# Patient Record
Sex: Female | Born: 1940 | Race: White | Hispanic: No | Smoking: Never smoker
Health system: Southern US, Community
[De-identification: ages and names within clinical notes are randomized; demographics above are authoritative.]

## PROBLEM LIST (undated history)

## (undated) ENCOUNTER — Emergency Department (HOSPITAL_COMMUNITY): Disposition: A | Discharge status: Home / Self Care | Payer: Medicare Other

## (undated) ENCOUNTER — Emergency Department (HOSPITAL_BASED_OUTPATIENT_CLINIC_OR_DEPARTMENT_OTHER): Admission: EM | Payer: Medicare Other | Source: Home / Self Care

## (undated) DIAGNOSIS — R0602 Shortness of breath: Secondary | ICD-10-CM

## (undated) DIAGNOSIS — I2510 Atherosclerotic heart disease of native coronary artery without angina pectoris: Secondary | ICD-10-CM

## (undated) DIAGNOSIS — M1990 Unspecified osteoarthritis, unspecified site: Secondary | ICD-10-CM

## (undated) DIAGNOSIS — J189 Pneumonia, unspecified organism: Secondary | ICD-10-CM

## (undated) DIAGNOSIS — B952 Enterococcus as the cause of diseases classified elsewhere: Secondary | ICD-10-CM

## (undated) DIAGNOSIS — K219 Gastro-esophageal reflux disease without esophagitis: Secondary | ICD-10-CM

## (undated) DIAGNOSIS — J8410 Pulmonary fibrosis, unspecified: Secondary | ICD-10-CM

## (undated) DIAGNOSIS — Z9981 Dependence on supplemental oxygen: Secondary | ICD-10-CM

## (undated) DIAGNOSIS — D649 Anemia, unspecified: Secondary | ICD-10-CM

## (undated) DIAGNOSIS — T826XXA Infection and inflammatory reaction due to cardiac valve prosthesis, initial encounter: Secondary | ICD-10-CM

## (undated) DIAGNOSIS — I509 Heart failure, unspecified: Secondary | ICD-10-CM

## (undated) DIAGNOSIS — Z5189 Encounter for other specified aftercare: Secondary | ICD-10-CM

## (undated) DIAGNOSIS — I209 Angina pectoris, unspecified: Secondary | ICD-10-CM

## (undated) DIAGNOSIS — K922 Gastrointestinal hemorrhage, unspecified: Secondary | ICD-10-CM

## (undated) DIAGNOSIS — IMO0002 Reserved for concepts with insufficient information to code with codable children: Secondary | ICD-10-CM

## (undated) DIAGNOSIS — R7881 Bacteremia: Secondary | ICD-10-CM

## (undated) DIAGNOSIS — I38 Endocarditis, valve unspecified: Secondary | ICD-10-CM

## (undated) DIAGNOSIS — M329 Systemic lupus erythematosus, unspecified: Secondary | ICD-10-CM

## (undated) DIAGNOSIS — R011 Cardiac murmur, unspecified: Secondary | ICD-10-CM

## (undated) DIAGNOSIS — E785 Hyperlipidemia, unspecified: Secondary | ICD-10-CM

## (undated) DIAGNOSIS — I10 Essential (primary) hypertension: Secondary | ICD-10-CM

## (undated) DIAGNOSIS — I4510 Unspecified right bundle-branch block: Secondary | ICD-10-CM

## (undated) DIAGNOSIS — G4700 Insomnia, unspecified: Secondary | ICD-10-CM

## (undated) DIAGNOSIS — I4891 Unspecified atrial fibrillation: Secondary | ICD-10-CM

## (undated) DIAGNOSIS — N059 Unspecified nephritic syndrome with unspecified morphologic changes: Secondary | ICD-10-CM

## (undated) DIAGNOSIS — Z8719 Personal history of other diseases of the digestive system: Secondary | ICD-10-CM

## (undated) DIAGNOSIS — IMO0001 Reserved for inherently not codable concepts without codable children: Secondary | ICD-10-CM

## (undated) DIAGNOSIS — J42 Unspecified chronic bronchitis: Secondary | ICD-10-CM

## (undated) HISTORY — DX: Hyperlipidemia, unspecified: E78.5

## (undated) HISTORY — DX: Enterococcus as the cause of diseases classified elsewhere: B95.2

## (undated) HISTORY — PX: CORONARY ANGIOPLASTY WITH STENT PLACEMENT: SHX49

## (undated) HISTORY — DX: Infection and inflammatory reaction due to cardiac valve prosthesis, initial encounter: T82.6XXA

## (undated) HISTORY — DX: Insomnia, unspecified: G47.00

## (undated) HISTORY — DX: Enterococcus as the cause of diseases classified elsewhere: R78.81

## (undated) HISTORY — DX: Unspecified atrial fibrillation: I48.91

## (undated) HISTORY — PX: CARDIAC CATHETERIZATION: SHX172

## (undated) HISTORY — DX: Endocarditis, valve unspecified: I38

## (undated) HISTORY — DX: Pulmonary fibrosis, unspecified: J84.10

## (undated) HISTORY — DX: Gastro-esophageal reflux disease without esophagitis: K21.9

## (undated) HISTORY — DX: Atherosclerotic heart disease of native coronary artery without angina pectoris: I25.10

---
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
## 1975-03-27 HISTORY — PX: BREAST LUMPECTOMY: SHX2

## 1997-11-16 ENCOUNTER — Ambulatory Visit (HOSPITAL_COMMUNITY): Admission: RE | Admit: 1997-11-16 | Discharge: 1997-11-17 | Payer: Self-pay | Admitting: Cardiology

## 1998-11-28 ENCOUNTER — Encounter: Payer: Self-pay | Admitting: Emergency Medicine

## 1998-11-28 ENCOUNTER — Inpatient Hospital Stay (HOSPITAL_COMMUNITY): Admission: EM | Admit: 1998-11-28 | Discharge: 1998-12-13 | Payer: Self-pay | Admitting: Emergency Medicine

## 1998-12-02 ENCOUNTER — Encounter: Payer: Self-pay | Admitting: Cardiology

## 1998-12-07 ENCOUNTER — Encounter: Payer: Self-pay | Admitting: Cardiology

## 1998-12-08 ENCOUNTER — Encounter: Payer: Self-pay | Admitting: Critical Care Medicine

## 1998-12-09 ENCOUNTER — Encounter: Payer: Self-pay | Admitting: Cardiology

## 1998-12-10 ENCOUNTER — Encounter: Payer: Self-pay | Admitting: Critical Care Medicine

## 1998-12-12 ENCOUNTER — Encounter: Payer: Self-pay | Admitting: Cardiovascular Disease

## 1998-12-16 ENCOUNTER — Inpatient Hospital Stay (HOSPITAL_COMMUNITY): Admission: EM | Admit: 1998-12-16 | Discharge: 1998-12-17 | Payer: Self-pay | Admitting: Emergency Medicine

## 1998-12-16 ENCOUNTER — Encounter: Payer: Self-pay | Admitting: Cardiology

## 1998-12-16 ENCOUNTER — Encounter: Payer: Self-pay | Admitting: Emergency Medicine

## 1998-12-26 ENCOUNTER — Inpatient Hospital Stay (HOSPITAL_COMMUNITY): Admission: EM | Admit: 1998-12-26 | Discharge: 1998-12-28 | Payer: Self-pay | Admitting: Emergency Medicine

## 1998-12-26 ENCOUNTER — Encounter: Payer: Self-pay | Admitting: Emergency Medicine

## 1999-01-05 ENCOUNTER — Encounter: Admission: RE | Admit: 1999-01-05 | Discharge: 1999-01-05 | Payer: Self-pay | Admitting: Cardiology

## 1999-04-26 ENCOUNTER — Ambulatory Visit (HOSPITAL_COMMUNITY): Admission: RE | Admit: 1999-04-26 | Discharge: 1999-04-26 | Payer: Self-pay

## 1999-08-02 ENCOUNTER — Encounter: Payer: Self-pay | Admitting: Critical Care Medicine

## 1999-08-02 ENCOUNTER — Ambulatory Visit (HOSPITAL_COMMUNITY): Admission: RE | Admit: 1999-08-02 | Discharge: 1999-08-02 | Payer: Self-pay | Admitting: Critical Care Medicine

## 1999-09-18 ENCOUNTER — Ambulatory Visit: Admission: RE | Admit: 1999-09-18 | Discharge: 1999-09-18 | Payer: Self-pay | Admitting: Critical Care Medicine

## 2000-11-24 HISTORY — PX: CATARACT EXTRACTION W/ INTRAOCULAR LENS  IMPLANT, BILATERAL: SHX1307

## 2002-03-04 ENCOUNTER — Encounter: Payer: Self-pay | Admitting: Cardiology

## 2002-03-04 ENCOUNTER — Ambulatory Visit (HOSPITAL_COMMUNITY): Admission: RE | Admit: 2002-03-04 | Discharge: 2002-03-04 | Payer: Self-pay | Admitting: Cardiology

## 2002-03-10 ENCOUNTER — Ambulatory Visit (HOSPITAL_COMMUNITY): Admission: RE | Admit: 2002-03-10 | Discharge: 2002-03-11 | Payer: Self-pay | Admitting: Cardiology

## 2003-03-31 ENCOUNTER — Encounter (INDEPENDENT_AMBULATORY_CARE_PROVIDER_SITE_OTHER): Payer: Self-pay | Admitting: Cardiology

## 2003-03-31 ENCOUNTER — Ambulatory Visit (HOSPITAL_COMMUNITY): Admission: RE | Admit: 2003-03-31 | Discharge: 2003-03-31 | Payer: Self-pay | Admitting: Cardiology

## 2004-03-26 HISTORY — PX: CORONARY ARTERY BYPASS GRAFT: SHX141

## 2004-03-26 HISTORY — PX: CARDIAC VALVE REPLACEMENT: SHX585

## 2004-03-30 ENCOUNTER — Ambulatory Visit (HOSPITAL_COMMUNITY): Admission: RE | Admit: 2004-03-30 | Discharge: 2004-03-30 | Payer: Self-pay | Admitting: Cardiology

## 2004-04-06 ENCOUNTER — Inpatient Hospital Stay (HOSPITAL_COMMUNITY)
Admission: RE | Admit: 2004-04-06 | Discharge: 2004-04-15 | Payer: Self-pay | Admitting: Thoracic Surgery (Cardiothoracic Vascular Surgery)

## 2004-04-06 ENCOUNTER — Encounter (INDEPENDENT_AMBULATORY_CARE_PROVIDER_SITE_OTHER): Payer: Self-pay | Admitting: *Deleted

## 2004-05-08 ENCOUNTER — Encounter
Admission: RE | Admit: 2004-05-08 | Discharge: 2004-05-08 | Payer: Self-pay | Admitting: Thoracic Surgery (Cardiothoracic Vascular Surgery)

## 2004-07-28 ENCOUNTER — Ambulatory Visit: Payer: Self-pay | Admitting: Critical Care Medicine

## 2004-09-15 ENCOUNTER — Ambulatory Visit: Payer: Self-pay | Admitting: Internal Medicine

## 2004-09-15 ENCOUNTER — Encounter: Payer: Self-pay | Admitting: Critical Care Medicine

## 2004-12-25 ENCOUNTER — Ambulatory Visit: Payer: Self-pay | Admitting: Critical Care Medicine

## 2005-05-22 ENCOUNTER — Ambulatory Visit: Payer: Self-pay | Admitting: Critical Care Medicine

## 2005-05-28 ENCOUNTER — Ambulatory Visit: Payer: Self-pay | Admitting: Critical Care Medicine

## 2005-05-30 ENCOUNTER — Ambulatory Visit: Payer: Self-pay | Admitting: Internal Medicine

## 2005-07-26 ENCOUNTER — Ambulatory Visit: Payer: Self-pay | Admitting: Critical Care Medicine

## 2005-12-31 ENCOUNTER — Ambulatory Visit: Payer: Self-pay | Admitting: Critical Care Medicine

## 2006-02-12 ENCOUNTER — Ambulatory Visit: Payer: Self-pay | Admitting: Critical Care Medicine

## 2006-02-21 ENCOUNTER — Ambulatory Visit: Payer: Self-pay | Admitting: Critical Care Medicine

## 2006-05-09 ENCOUNTER — Ambulatory Visit: Payer: Self-pay | Admitting: Critical Care Medicine

## 2007-01-27 DIAGNOSIS — J8410 Pulmonary fibrosis, unspecified: Secondary | ICD-10-CM

## 2007-01-27 DIAGNOSIS — G2581 Restless legs syndrome: Secondary | ICD-10-CM

## 2007-01-27 DIAGNOSIS — K219 Gastro-esophageal reflux disease without esophagitis: Secondary | ICD-10-CM

## 2007-01-27 DIAGNOSIS — E785 Hyperlipidemia, unspecified: Secondary | ICD-10-CM

## 2007-01-27 DIAGNOSIS — I2510 Atherosclerotic heart disease of native coronary artery without angina pectoris: Secondary | ICD-10-CM | POA: Insufficient documentation

## 2007-01-27 DIAGNOSIS — Z951 Presence of aortocoronary bypass graft: Secondary | ICD-10-CM

## 2007-07-03 ENCOUNTER — Ambulatory Visit: Payer: Self-pay | Admitting: Critical Care Medicine

## 2007-07-07 ENCOUNTER — Telehealth (INDEPENDENT_AMBULATORY_CARE_PROVIDER_SITE_OTHER): Payer: Self-pay | Admitting: *Deleted

## 2007-07-15 ENCOUNTER — Ambulatory Visit: Payer: Self-pay | Admitting: Critical Care Medicine

## 2007-07-16 ENCOUNTER — Encounter: Payer: Self-pay | Admitting: Critical Care Medicine

## 2008-01-09 ENCOUNTER — Ambulatory Visit: Payer: Self-pay | Admitting: Critical Care Medicine

## 2008-05-11 ENCOUNTER — Ambulatory Visit (HOSPITAL_COMMUNITY): Admission: RE | Admit: 2008-05-11 | Discharge: 2008-05-11 | Payer: Self-pay | Admitting: Cardiology

## 2008-08-03 ENCOUNTER — Ambulatory Visit: Payer: Self-pay | Admitting: Critical Care Medicine

## 2008-10-20 ENCOUNTER — Ambulatory Visit: Payer: Self-pay | Admitting: Critical Care Medicine

## 2009-01-31 ENCOUNTER — Ambulatory Visit: Payer: Self-pay | Admitting: Critical Care Medicine

## 2009-05-11 ENCOUNTER — Ambulatory Visit: Payer: Self-pay | Admitting: Critical Care Medicine

## 2009-07-29 ENCOUNTER — Ambulatory Visit: Payer: Self-pay | Admitting: Critical Care Medicine

## 2009-07-31 ENCOUNTER — Telehealth: Payer: Self-pay | Admitting: Critical Care Medicine

## 2009-12-13 ENCOUNTER — Ambulatory Visit: Payer: Self-pay | Admitting: Critical Care Medicine

## 2009-12-13 DIAGNOSIS — I4891 Unspecified atrial fibrillation: Secondary | ICD-10-CM | POA: Insufficient documentation

## 2010-02-10 ENCOUNTER — Encounter: Payer: Self-pay | Admitting: Critical Care Medicine

## 2010-04-15 ENCOUNTER — Encounter: Payer: Self-pay | Admitting: Thoracic Surgery (Cardiothoracic Vascular Surgery)

## 2010-04-25 NOTE — Miscellaneous (Signed)
Summary: Orders Update pft charges   Clinical Lists Changes  Orders: Added new Service order of Carbon Monoxide diffusing w/capacity (94720) - Signed Added new Service order of Spirometry (Pre & Post) (94060) - Signed 

## 2010-04-25 NOTE — Letter (Signed)
Summary: [facility]   Imported By: Sherian Rein 03/01/2010 13:57:11  _____________________________________________________________________  External Attachment:    Type:   Image     Comment:   External Document

## 2010-04-25 NOTE — Progress Notes (Signed)
Summary: CXR results  Phone Note Outgoing Call   Reason for Call: Discuss lab or test results Summary of Call: call pt and tell her the CXR is ok Initial call taken by: Storm Frisk MD,  Jul 31, 2009 3:39 PM  Follow-up for Phone Call        [facility] Gweneth Dimitri RN  Aug 01, 2009 8:39 AM  [facility] Gweneth Dimitri RN  Aug 01, 2009 4:51 PM  Spoke with pt.  Pt informed CXR ok per PW.  She verbalized understanding and voiced no questions.   Follow-up by: Gweneth Dimitri RN,  Aug 01, 2009 5:07 PM

## 2010-04-25 NOTE — Assessment & Plan Note (Signed)
Summary: Acute Pulmonary OV   Copy to:  Chase Picket Primary Provider/Referring Provider:  Sharyn Lull  CC:  Acute Visit.  c/o head and chest congestion, dry cough, wheezing, chest tightness, runny nose, and and fever x5days. denies increased SOB.Marland Kitchen  History of Present Illness: 70 -year-old white female history of mixed connected tissue disease, pulmonary fibrosis, pneumonitis,     January 31, 2009 2:17 PM No changes made at last ov No change in dyspnea.  Now has min cough not as bad as before.  Some wheeze with exertion. Knees are an issue else joints are ok  May 11, 2009 11:37 AM Still with a cough and sl worse last few days with sneezing, pn drip and congestion.   cough is dry.   Dyspnea is worse , feels feverish,, chills,  symptoms for 5 days.  There is more dyspnea.    Preventive Screening-Counseling & Management  Alcohol-Tobacco     Smoking Status: never  Current Medications (verified): 1)  Nifedical Xl 60 Mg  Tb24 (Nifedipine) .... One Once Daily 2)  Avalide 300-12.5 Mg Tabs (Irbesartan-Hydrochlorothiazide) .... Once Daily 3)  Betapace Af 120 Mg  Tabs (Sotalol Hcl Af) .... Two By Mouth Once Daily 4)  Zocor 40 Mg  Tabs (Simvastatin) .... One Half By Mouth Once Daily 5)  Kaon-Cl-10 10 Meq  Tbcr (Potassium Chloride) .... Two By Mouth Once Daily 6)  Toprol Xl 100 Mg  Tb24 (Metoprolol Succinate) .... One By Mouth Once Daily 7)  Adult Aspirin Low Strength 81 Mg  Tbdp (Aspirin) .... Two Each Morning 8)  Sulfasalazine 500 Mg  Tabs (Sulfasalazine) .... Two By Mouth Two Times A Day 9)  Plaquenil 200 Mg  Tabs (Hydroxychloroquine Sulfate) .... One By Mouth Once Daily 10)  Requip 1 Mg  Tabs (Ropinirole Hcl) .... One By Mouth At Bedtime 11)  Prednisone 5 Mg  Tabs (Prednisone) .... One Po Once Daily 12)  Methotrexate 2.5 Mg  Tabs (Methotrexate Sodium) .... 7 By Mouth Weekly 13)  Pantoprazole Sodium 40 Mg Tbec (Pantoprazole Sodium) .... Take 1 Tab Once Daily  Allergies (verified): 1)   ! Codeine 2)  Codeine Phosphate (Codeine Phosphate)  Past History:  Past medical, surgical, family and social histories (including risk factors) reviewed, and no changes noted (except as noted below).  Past Medical History: Reviewed history from 10/20/2008 and no changes required. 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 noncontrib  Social History: Reviewed history from 07/03/2007 and no changes required. Patient never smoked.  Smoking Status:  never  Review of Systems       The patient complains of shortness of breath with activity, non-productive cough, headaches, nasal congestion/difficulty breathing through nose, sneezing, and fever.  The patient denies shortness of breath at rest, productive cough, coughing up blood, chest pain, irregular heartbeats, acid heartburn, indigestion, loss of appetite, weight change, abdominal pain, difficulty swallowing, sore throat, tooth/dental problems, itching, ear ache, anxiety, depression, hand/feet swelling, joint stiffness or pain, rash, and change in color of mucus.    Vital Signs:  Patient profile:   70 year old female Height:      67 inches Weight:      242 pounds BMI:     38.04 O2 Sat:      97 % on Room air Temp:  98.0 degrees F oral Pulse rate:   45 / minute BP sitting:   100 / 72  (right arm) Cuff size:   large  Vitals Entered By: Gweneth Dimitri RN (May 11, 2009 11:25 AM)  O2 Flow:  Room air CC: Acute Visit.  c/o head and chest congestion, dry cough, wheezing, chest tightness, runny nose, and fever x5days. denies increased SOB. Comments Medications reviewed with patient Daytime contact number verified with patient. Gweneth Dimitri RN  May 11, 2009 11:26 AM    Physical Exam  Additional  Exam:  Gen obese WF          in NAD    NCAT Heent:  no jvd, no TMG, no cervical LNademopathy, orophyx clear,  nares with purulent  drainage. Cor: RRR nl s1/s2  no s3/s4  no m r h g Abd: soft NT BSA   no masses  No HSM  no rebound or guarding Ext perfused with no c v e v.d Neuro: intact, moves all 4s, CN II-XII intact, DTRs intact Chest:  coarse BS w/ few rhonchi Skin: clear  Genital/Rectal :deferred    Impression & Recommendations:  Problem # 1:  BRONCHITIS, ACUTE (ICD-466.0) Assessment Deteriorated acute tracheobronchitis and sinusitis plan Cefuroxime one twice daily for 5 days Pulse prednisone 10mg  Take 4 daily for two days, then 3 daily for two days, then two daily for two days then one daily for two days then resume the 5 mg daily prednisone Nasacort two sprays each nostril daily Mucinex DM two twice daily for 7 days (600mg ) Return 4 months or sooner if unimproved Her updated medication list for this problem includes:    Cefuroxime Axetil 500 Mg Tabs (Cefuroxime axetil) ..... One by mouth two times a day  Orders: Est. 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 One by mouth two times a day 2)  Prednisone 10 Mg Tabs (Prednisone) .... Take as directed take 4 daily for two days, then 3 daily for two days, then two daily for two days then one daily for two days then stop  Complete Medication List: 1)  Nifedical Xl 60 Mg Tb24 (Nifedipine) .... One once daily 2)  Avalide 300-12.5 Mg Tabs (Irbesartan-hydrochlorothiazide) .... Once daily 3)  Betapace Af 120 Mg Tabs (Sotalol hcl af) .... Two by mouth once daily 4)  Zocor 40 Mg Tabs (Simvastatin) .... One half by mouth once daily 5)   Kaon-cl-10 10 Meq Tbcr (Potassium chloride) .... Two by mouth once daily 6)  Toprol Xl 100 Mg Tb24 (Metoprolol succinate) .... One by mouth once daily 7)  Adult Aspirin Low Strength 81 Mg Tbdp (Aspirin) .... Two each morning 8)  Sulfasalazine 500 Mg Tabs (Sulfasalazine) .... Two by mouth two times a day 9)  Plaquenil 200 Mg Tabs (Hydroxychloroquine sulfate) .... One by mouth once daily 10)  Requip 1 Mg Tabs (Ropinirole hcl) .... One by mouth at bedtime 11)  Prednisone 5 Mg Tabs (Prednisone) .... One po once daily 12)  Methotrexate 2.5 Mg Tabs (Methotrexate sodium) .... 7 by mouth weekly 13)  Pantoprazole Sodium 40 Mg Tbec (Pantoprazole sodium) .... Take 1 tab once daily 14)  Cefuroxime Axetil 500 Mg Tabs (Cefuroxime axetil) .... One by mouth two times a day 15)  Prednisone 10 Mg Tabs (Prednisone) .... Take as directed take 4 daily for two days, then 3 daily for two days, then two daily for two days then one daily for two days then stop  Patient Instructions: 1)  Cefuroxime one twice daily for 5 days 2)  Pulse prednisone 10mg  Take 4 daily for two days, then 3 daily for two days, then two daily for two days then one daily for two days then resume the 5 mg daily prednisone 3)  Nasacort two sprays each nostril daily 4)  Mucinex DM two twice daily for 7 days (600mg ) 5)  Return 4 months or sooner if unimproved Prescriptions: PREDNISONE 10 MG  TABS (PREDNISONE) Take as directed Take 4 daily for two days, then 3 daily for two days, then two daily for two days then one daily for two days then stop  #20 x 0   Entered and Authorized by:   Storm Frisk MD   Signed by:   Storm Frisk MD on 05/11/2009   Method used:   Electronically to        [facility]  [phone]* (retail)       [address] [place], [place]  [address]       Ph: [phone] or [phone]       Fax: [phone]   RxID:   [id number] CEFUROXIME AXETIL 500 MG TABS (CEFUROXIME AXETIL) One by mouth two times a  day  #10 x 0   Entered and Authorized by:   Storm Frisk MD   Signed by:   Storm Frisk MD on 05/11/2009   Method used:   Electronically to        [facility]  [phone]* (retail)       [address] [place], [place]  [address]       Ph: [phone] or [phone]       Fax: [phone]   RxID:   [id number]     Appended Document: Acute Pulmonary OV fax Dr Sharyn Lull

## 2010-04-25 NOTE — Assessment & Plan Note (Signed)
Summary: PUlmonary OV   Copy to:  Azzie Roup Primary Goldie Tregoning/Referring Jiovani Mccammon:  Sharyn Lull  CC:  4 month follow up with PFTs.  Pt states breathing is unchaged from last OV.  Occ wheezing and dry cough.  Denies chest tightness.Marland Kitchen  History of Present Illness: 70 -year-old white female history of mixed connected tissue disease, pulmonary fibrosis, pneumonitis,      December 13, 2009 3:23 PM Dyspnea is the same.  No cough until the last few days.  No mucus.  No chest pain.  No f/c/s. No stamina as before .   Pt denies any significant sore throat, nasal congestion or excess secretions, fever, chills, sweats, unintended weight loss, pleurtic or exertional chest pain, orthopnea PND, or leg swelling Pt denies any increase in rescue therapy over baseline, denies waking up needing it or having any early am or nocturnal exacerbations of coughing/wheezing/or dyspnea.   Preventive Screening-Counseling & Management  Alcohol-Tobacco     Smoking Status: never  Pulmonary Function Test Date: 12/13/2009 Height (in.): 66 Gender: Female  Pre-Spirometry FVC    Value: 2.57 L/min   Pred: 3.03 L/min     % Pred: 85 % FEV1    Value: 1.92 L     Pred: 2.17 L     % Pred: 89 % FEV1/FVC  Value: 75 %     Pred: 71 %    FEF 25-75  Value: 1.49 L/min   Pred: 2.38 L/min     % Pred: 63 %  Post-Spirometry FVC    Value: 2.61 L/min   Pred: 3.03 L/min     % Pred: 86 % FEV1    Value: 2.05 L     Pred: 2.17 L     % Pred: 95 % FEV1/FVC  Value: 79 %     Pred: 71 %    FEF 25-75  Value: 1.99 L/min   Pred: 2.38 L/min     % Pred: 84 %  Lung Volumes DLCO    Value: 12.1 %   % Pred: 47 % DLCO/VA  Value: 2.97 %   % Pred: 85 %  Current Medications (verified): 1)  Nifedical Xl 60 Mg  Tb24 (Nifedipine) .... One Once Daily 2)  Betapace Af 120 Mg  Tabs (Sotalol Hcl Af) .... One By Mouth  Two Times A Day 3)  Zocor 40 Mg  Tabs (Simvastatin) .... One Half By Mouth Once Daily 4)  Klor-Con M20 20 Meq Cr-Tabs (Potassium Chloride  Crys Cr) .... Once Daily 5)  Toprol Xl 100 Mg  Tb24 (Metoprolol Succinate) .... One By Mouth Once Daily 6)  Adult Aspirin Low Strength 81 Mg  Tbdp (Aspirin) .... Two Each Morning 7)  Sulfasalazine 500 Mg  Tabs (Sulfasalazine) .... Two By Mouth Two Times A Day 8)  Plaquenil 200 Mg  Tabs (Hydroxychloroquine Sulfate) .... One By Mouth Two Times A Day 9)  Requip 1 Mg  Tabs (Ropinirole Hcl) .... One By Mouth At Bedtime 10)  Prednisone 5 Mg  Tabs (Prednisone) .... One Po Once Daily 11)  Methotrexate 2.5 Mg  Tabs (Methotrexate Sodium) .... 7 By Mouth Weekly 12)  Pantoprazole Sodium 40 Mg Tbec (Pantoprazole Sodium) .... Take 1 Tab Once Daily 13)  Benicar Hct .... Once Daily 14)  Furosemide 40 Mg Tabs (Furosemide) .... Once Daily  Allergies (verified): 1)  ! Codeine 2)  Codeine Phosphate (Codeine Phosphate)  Past History:  Past medical, surgical, family and social histories (including risk factors) reviewed, and no changes noted (except as  noted below).  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 noncontrib  Social History: Reviewed history from 07/03/2007 and no changes required. Patient never smoked.   Review of Systems       The patient complains of shortness of breath with activity.  The patient denies shortness of breath at rest, productive cough, non-productive cough, coughing up blood, chest pain, irregular heartbeats, acid heartburn, indigestion, loss of appetite, weight change, abdominal pain, difficulty swallowing, sore throat, tooth/dental problems, headaches, nasal congestion/difficulty breathing through nose, sneezing, itching, ear ache, anxiety, depression,  hand/feet swelling, joint stiffness or pain, rash, change in color of mucus, and fever.    Vital Signs:  Patient profile:   70 year old female Height:      66 inches Weight:      242 pounds BMI:     39.20 O2 Sat:      91 % on Room air Temp:     98.5 degrees F oral Pulse rate:   61 / minute BP sitting:   140 / 80  (right arm) Cuff size:   large  Vitals Entered By: Gweneth Dimitri RN (December 13, 2009 3:43 PM)  O2 Flow:  Room air  Serial Vital Signs/Assessments:  Comments: 4:12 PM Ambulatory Pulse Oximetry  Resting; HR_60____    02 Sat__95% RA___  Lap1 (185 feet)   HR_78____   02 Sat_95% RA____ Lap2 (185 feet)   HR_88____   02 Sat__91% RA___    Lap3 (185 feet)   HR_____   02 Sat_____  ___Test Completed without Difficulty _x__Test Stopped due to: Pt requesting to stop walk d/t feeling SOB. Gweneth Dimitri RN  December 13, 2009 4:12 PM  By: Gweneth Dimitri RN   CC: 4 month follow up with PFTs.  Pt states breathing is unchaged from last OV.  Occ wheezing and dry cough.  Denies chest tightness. Comments Medications reviewed with patient Daytime contact number verified with patient. Gweneth Dimitri RN  December 13, 2009 3:44 PM    Physical Exam  Additional Exam:  Gen obese WF          in NAD    NCAT Heent:  no jvd, no TMG, no cervical LNademopathy, orophyx clear,  nares with purulent  drainage. Cor: RRR nl s1/s2  no s3/s4  no m r h g Abd: soft NT BSA   no masses  No HSM  no rebound or guarding Ext perfused with no c v e v.d Neuro: intact, moves all 4s, CN II-XII intact, DTRs intact Chest:  coarse BS w/ few basilar rales Skin: clear  Genital/Rectal :deferred    Pulmonary Function Test Date: 12/13/2009 Height (in.): 66 Gender: Female  Pre-Spirometry FVC    Value: 2.57 L/min   Pred: 3.03 L/min     % Pred: 85 % FEV1    Value: 1.92 L     Pred: 2.17 L     % Pred: 89 % FEV1/FVC  Value: 75 %     Pred: 71 %    FEF 25-75  Value: 1.49 L/min   Pred: 2.38 L/min     % Pred:  63 %  Post-Spirometry FVC    Value: 2.61 L/min   Pred:  3.03 L/min     % Pred: 86 % FEV1    Value: 2.05 L     Pred: 2.17 L     % Pred: 95 % FEV1/FVC  Value: 79 %     Pred: 71 %    FEF 25-75  Value: 1.99 L/min   Pred: 2.38 L/min     % Pred: 84 %  Lung Volumes DLCO    Value: 12.1 %   % Pred: 47 % DLCO/VA  Value: 2.97 %   % Pred: 85 %  Impression & Recommendations:  Problem # 1:  PULMONARY FIBROSIS (ICD-515) Assessment Unchanged  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  Pt is on two beta blockers I discussed with dr Sharyn Lull who will alter meds ,  prior hx of afib and sotolol was being used for rate control  plan: No change in current prednisone dosing  Complete Medication List: 1)  Nifedical Xl 60 Mg Tb24 (Nifedipine) .... One once daily 2)  Betapace Af 120 Mg Tabs (Sotalol hcl af) .... One by mouth  two times a day 3)  Zocor 40 Mg Tabs (Simvastatin) .... One half by mouth once daily 4)  Klor-con M20 20 Meq Cr-tabs (Potassium chloride crys cr) .... Once daily 5)  Toprol Xl 100 Mg Tb24 (Metoprolol succinate) .... One by mouth once daily 6)  Adult Aspirin Low Strength 81 Mg Tbdp (Aspirin) .... Two each morning 7)  Sulfasalazine 500 Mg Tabs (Sulfasalazine) .... Two by mouth two times a day 8)  Plaquenil 200 Mg Tabs (Hydroxychloroquine sulfate) .... One by mouth two times a day 9)  Requip 1 Mg Tabs (Ropinirole hcl) .... One by mouth at bedtime 10)  Prednisone 5 Mg Tabs (Prednisone) .... One po once daily 11)  Methotrexate 2.5 Mg Tabs (Methotrexate sodium) .... 7 by mouth weekly 12)  Pantoprazole Sodium 40 Mg Tbec (Pantoprazole sodium) .... Take 1 tab once daily 13)  Benicar Hct  .... Once daily 14)  Furosemide 40 Mg Tabs (Furosemide) .... Once daily  Other Orders: Est. Patient Level IV (99214) Pulse Oximetry, Ambulatory (98119) Flu Vaccine 76yrs + MEDICARE PATIENTS (J4782) Administration  Flu vaccine - MCR (N5621)  Patient Instructions: 1)  Dr Sharyn Lull will call you to arrange an OV to make changes in blood pressure medications 2)  No other medication changes 3)  Return 4-5 months 4)  A Flu vaccine will be given   Prevention & Chronic Care Immunizations   Influenza vaccine: Fluvax 3+  (12/13/2009)    Tetanus booster: Not documented    Pneumococcal vaccine: Pneumovax (Medicare)  (01/31/2009)    H. zoster vaccine: Not documented  Colorectal Screening   Hemoccult: Not documented    Colonoscopy: Not documented  Other Screening   Pap smear: Not documented    Mammogram: Not documented    DXA bone density scan: Not documented   Smoking status: never  (12/13/2009)  Lipids   Total Cholesterol: Not documented   LDL: Not documented   LDL Direct: Not documented   HDL: Not documented   Triglycerides: Not documented    SGOT (AST): Not documented   SGPT (ALT): Not documented   Alkaline phosphatase: Not documented   Total bilirubin: Not documented  Self-Management Support :    Lipid self-management support: Not documented    Nursing Instructions: Give Flu vaccine today  Flu Vaccine Consent Questions     Do you have a history of severe  allergic reactions to this vaccine? no    Any prior history of allergic reactions to egg and/or gelatin? no    Do you have a sensitivity to the preservative Thimersol? no    Do you have a past history of Guillan-Barre Syndrome? no    Do you currently have an acute febrile illness? no    Have you ever had a severe reaction to latex? no    Vaccine information given and explained to patient? yes    Are you currently pregnant? no    Lot Number:[id number]   Exp Date:09/23/2010   Site Given  Left Deltoid IMdflu Gweneth Dimitri RN  December 13, 2009 4:11 PM  Appended Document: PUlmonary OV fax Azzie Roup, harwani

## 2010-04-25 NOTE — Assessment & Plan Note (Signed)
Summary: Pulmonary OV   Copy to:  Azzie Roup Primary Provider/Referring Provider:  Sharyn Lull  CC:  4 month follow up.  Pt states breathing is the same - no better or worse.  Denies cough.  No complaints. .  History of Present Illness: 70 -year-old white female history of mixed connected tissue disease, pulmonary fibrosis, pneumonitis,     January 31, 2009 2:17 PM No changes made at last ov No change in dyspnea.  Now has min cough not as bad as before.  Some wheeze with exertion. Knees are an issue else joints are ok  May 11, 2009 11:37 AM Still with a cough and sl worse last few days with sneezing, pn drip and congestion.   cough is dry.   Dyspnea is worse , feels feverish,, chills,  symptoms for 5 days.  There is more dyspnea.    Jul 29, 2009 4:32 PM No real changes,  Dyspea the same.  No chest pain  Pt denies any significant sore throat, nasal congestion or excess secretions, fever, chills, sweats, unintended weight loss, pleurtic or exertional chest pain, orthopnea PND, or leg swelling Pt denies any increase in rescue therapy over baseline, denies waking up needing it or having any early am or nocturnal exacerbations of coughing/wheezing/or dyspnea.   Preventive Screening-Counseling & Management  Alcohol-Tobacco     Smoking Status: never  Clinical Reports Reviewed:  CXR:  07/03/2007: CXR Results:  IMPRESSION:   1.  Mild cardiac enlargement, stable. 2.  Chronic lung changes but no acute pulmonary findings.    PFT's:  07/16/2007: DLCO %Predicted:  46 FEF 25/75 %Predicted:  73 FEV1 %Predicted:  89 FVC %Predicted:  83 Post Spirometry FEF 25/75 %Predicted:  90 Post Spirometry FEV1 %Predicted:  93 Post Spirometry FVC %Predicted:  84 RV %Predicted:  86 TLC %Predicted:  84   Current Medications (verified): 1)  Nifedical Xl 60 Mg  Tb24 (Nifedipine) .... One Once Daily 2)  Betapace Af 120 Mg  Tabs (Sotalol Hcl Af) .... One By Mouth  Two Times A Day 3)   Zocor 40 Mg  Tabs (Simvastatin) .... One Half By Mouth Once Daily 4)  Klor-Con M20 20 Meq Cr-Tabs (Potassium Chloride Crys Cr) .... Once Daily 5)  Toprol Xl 100 Mg  Tb24 (Metoprolol Succinate) .... One By Mouth Once Daily 6)  Adult Aspirin Low Strength 81 Mg  Tbdp (Aspirin) .... Two Each Morning 7)  Sulfasalazine 500 Mg  Tabs (Sulfasalazine) .... Two By Mouth Two Times A Day 8)  Plaquenil 200 Mg  Tabs (Hydroxychloroquine Sulfate) .... One By Mouth Two Times A Day 9)  Requip 1 Mg  Tabs (Ropinirole Hcl) .... One By Mouth At Bedtime 10)  Prednisone 5 Mg  Tabs (Prednisone) .... One Po Once Daily 11)  Methotrexate 2.5 Mg  Tabs (Methotrexate Sodium) .... 7 By Mouth Weekly 12)  Pantoprazole Sodium 40 Mg Tbec (Pantoprazole Sodium) .... Take 1 Tab Once Daily 13)  Benicar Hct .... Once Daily 14)  Furosemide 40 Mg Tabs (Furosemide) .... Once Daily  Allergies (verified): 1)  ! Codeine 2)  Codeine Phosphate (Codeine Phosphate)  Past History:  Past medical, surgical, family and social histories (including risk factors) reviewed, and no changes noted (except as noted below).  Past Medical History: Reviewed history from 10/20/2008 and no changes required. 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 noncontrib  Social History: Reviewed history from 07/03/2007 and no changes required. Patient never smoked.   Review of Systems       The patient complains of shortness of breath with activity.  The patient denies shortness of breath at rest, productive cough, non-productive cough, coughing up blood, chest pain, irregular heartbeats, acid heartburn, indigestion, loss of appetite, weight change, abdominal pain, difficulty swallowing, sore throat, tooth/dental problems, headaches,  nasal congestion/difficulty breathing through nose, sneezing, itching, ear ache, anxiety, depression, hand/feet swelling, joint stiffness or pain, rash, change in color of mucus, and fever.    Vital Signs:  Patient profile:   70 year old female Height:      66 inches Weight:      249 pounds BMI:     40.33 O2 Sat:      93 % on Room air Temp:     98.1 degrees F oral Pulse rate:   56 / minute BP sitting:   142 / 72  (right arm) Cuff size:   large  Vitals Entered By: Gweneth Dimitri RN (Jul 29, 2009 4:20 PM)  O2 Flow:  Room air CC: 4 month follow up.  Pt states breathing is the same - no better or worse.  Denies cough.  No complaints.  Comments Medications reviewed with patient Daytime contact number verified with patient. Gweneth Dimitri RN  Jul 29, 2009 4:20 PM    Physical Exam  Additional Exam:  Gen obese WF          in NAD    NCAT Heent:  no jvd, no TMG, no cervical LNademopathy, orophyx clear,  nares with purulent  drainage. Cor: RRR nl s1/s2  no s3/s4  no m r h g Abd: soft NT BSA   no masses  No HSM  no rebound or guarding Ext perfused with no c v e v.d Neuro: intact, moves all 4s, CN II-XII intact, DTRs intact Chest:  coarse BS w/ few rhonchi Skin: clear  Genital/Rectal :deferred    CXR  Procedure date:  07/29/2009  Findings:      IMPRESSION:   1.  Stable cardiomegaly. 2.  Very faint prominence of the interstitium.  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 One by mouth  two times a day 2)  Klor-con M20 20 Meq Cr-tabs (Potassium chloride crys cr) .... Once daily 3)  Plaquenil 200 Mg Tabs (Hydroxychloroquine sulfate) .... One by mouth two times a day 4)  Benicar Hct  .... Once  daily 5)  Furosemide 40 Mg Tabs (Furosemide) .... Once daily  Complete Medication List: 1)  Nifedical Xl 60 Mg Tb24 (Nifedipine) .... One once daily 2)  Betapace Af 120 Mg Tabs (Sotalol hcl af) .... One by mouth  two times a day 3)  Zocor 40 Mg Tabs (Simvastatin) .... One half by mouth once daily 4)  Klor-con M20 20 Meq Cr-tabs (Potassium chloride crys cr) .... Once daily 5)  Toprol Xl 100 Mg Tb24 (Metoprolol succinate) .... One by mouth once daily 6)  Adult Aspirin Low Strength 81 Mg Tbdp (Aspirin) .... Two each morning 7)  Sulfasalazine 500 Mg Tabs (Sulfasalazine) .... Two by mouth  two times a day 8)  Plaquenil 200 Mg Tabs (Hydroxychloroquine sulfate) .... One by mouth two times a day 9)  Requip 1 Mg Tabs (Ropinirole hcl) .... One by mouth at bedtime 10)  Prednisone 5 Mg Tabs (Prednisone) .... One po once daily 11)  Methotrexate 2.5 Mg Tabs (Methotrexate sodium) .... 7 by mouth weekly 12)  Pantoprazole Sodium 40 Mg Tbec (Pantoprazole sodium) .... Take 1 tab once daily 13)  Benicar Hct  .... Once daily 14)  Furosemide 40 Mg Tabs (Furosemide) .... Once daily  Other Orders: Est. Patient Level III (78295) Pulmonary Referral (Pulmonary) T-2 View CXR (71020TC)  Patient Instructions: 1)  No change in medications 2)  Pulmonary function studies and chest xray will be obtained 3)  Return in     4     months

## 2010-05-03 ENCOUNTER — Encounter: Payer: Self-pay | Admitting: Critical Care Medicine

## 2010-05-03 ENCOUNTER — Ambulatory Visit (INDEPENDENT_AMBULATORY_CARE_PROVIDER_SITE_OTHER): Payer: Medicare Other | Admitting: Critical Care Medicine

## 2010-05-03 DIAGNOSIS — J8410 Pulmonary fibrosis, unspecified: Secondary | ICD-10-CM

## 2010-05-03 DIAGNOSIS — M359 Systemic involvement of connective tissue, unspecified: Secondary | ICD-10-CM | POA: Insufficient documentation

## 2010-05-11 NOTE — Assessment & Plan Note (Signed)
Summary: Pulmonary OV   Copy to:  Azzie Roup Primary Provider/Referring Provider:  Sharyn Lull  CC:  5 month follow up.  Pt states breathing is unchanged from last OV - some SOB with exertion, wheezing at times, and occas nonprod cough.  .  History of Present Illness: 70 -year-old white female history of mixed connected tissue disease, pulmonary fibrosis, pneumonitis,      December 13, 2009 3:23 PM Dyspnea is the same.  No cough until the last few days.  No mucus.  No chest pain.  No f/c/s. No stamina as before .   Pt denies any significant sore throat, nasal congestion or excess secretions, fever, chills, sweats, unintended weight loss, pleurtic or exertional chest pain, orthopnea PND, or leg swelling Pt denies any increase in rescue therapy over baseline, denies waking up needing it or having any early am or nocturnal exacerbations of coughing/wheezing/or dyspnea.   May 03, 2010 9:32 AM No real changes.  No real change in breathing,  no cough   Pt denies any significant sore throat, nasal congestion or excess secretions, fever, chills, sweats, unintended weight loss, pleurtic or exertional chest pain, orthopnea PND, or leg swelling Pt denies any increase in rescue therapy over baseline, denies waking up needing it or having any early am or nocturnal exacerbations of coughing/wheezing/or dyspnea.   Clinical Reports Reviewed:  CXR:  07/29/2009: CXR Results:  IMPRESSION:   1.  Stable cardiomegaly. 2.  Very faint prominence of the interstitium.  07/03/2007: CXR Results:  IMPRESSION:   1.  Mild cardiac enlargement, stable. 2.  Chronic lung changes but no acute pulmonary findings.    PFT's:  12/13/2009: DLCO %Predicted:  47 FEF 25/75 %Predicted:  63 FEV1 %Predicted:  89 FVC %Predicted:  85 Post Spirometry FEF 25/75 %Predicted:  84 Post Spirometry FEV1 %Predicted:  95 Post Spirometry FVC %Predicted:  86  07/16/2007: DLCO %Predicted:  46 FEF 25/75 %Predicted:   73 FEV1 %Predicted:  89 FVC %Predicted:  83 Post Spirometry FEF 25/75 %Predicted:  90 Post Spirometry FEV1 %Predicted:  93 Post Spirometry FVC %Predicted:  84 RV %Predicted:  86 TLC %Predicted:  84   Preventive Screening-Counseling & Management  Alcohol-Tobacco     Smoking Status: never  Current Medications (verified): 1)  Nifedipine 90 Mg Xr24h-Tab (Nifedipine) .... Take 1 Tablet By Mouth Once A Day 2)  Betapace Af 120 Mg  Tabs (Sotalol Hcl Af) .... One By Mouth  Two Times A Day 3)  Zocor 40 Mg  Tabs (Simvastatin) .... One Half By Mouth Once Daily 4)  Klor-Con M20 20 Meq Cr-Tabs (Potassium Chloride Crys Cr) .... Once Daily 5)  Adult Aspirin Low Strength 81 Mg  Tbdp (Aspirin) .... Two Each Morning 6)  Sulfasalazine 500 Mg  Tabs (Sulfasalazine) .... Two By Mouth Two Times A Day 7)  Plaquenil 200 Mg  Tabs (Hydroxychloroquine Sulfate) .... One By Mouth Two Times A Day 8)  Requip 1 Mg  Tabs (Ropinirole Hcl) .... One By Mouth At Bedtime 9)  Prednisone 5 Mg  Tabs (Prednisone) .... One Po Once Daily 10)  Methotrexate 2.5 Mg  Tabs (Methotrexate Sodium) .... 7 By Mouth Weekly 11)  Benicar Hct 40-12.5 Mg Tabs (Olmesartan Medoxomil-Hctz) .... Take 1 Tablet By Mouth Once A Day 12)  Furosemide 40 Mg Tabs (Furosemide) .... Once Daily  Allergies (verified): 1)  ! Codeine 2)  Codeine Phosphate (Codeine Phosphate)  Past History:  Past medical, surgical, family and social histories (including risk factors) reviewed, and no  changes noted (except as noted below).  Past Medical History: Reviewed history from 12/13/2009 and no changes required. 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 noncontrib  Social History: Reviewed history from 07/03/2007 and no changes required. Patient never smoked.   Review of Systems       The patient complains of shortness of breath with activity.  The patient denies shortness of breath at rest, productive cough, non-productive cough, coughing up blood, chest pain, irregular heartbeats, acid heartburn, indigestion, loss of appetite, weight change, abdominal pain, difficulty swallowing, sore throat, tooth/dental problems, headaches, nasal congestion/difficulty breathing through nose, sneezing, itching, ear ache, anxiety, depression, hand/feet swelling, joint stiffness or pain, rash, change in color of mucus, and fever.    Vital Signs:  Patient profile:   70 year old female Height:      66 inches Weight:      227 pounds BMI:     36.77 O2 Sat:      92 % on Room air Temp:     98.3 degrees F oral Pulse rate:   64 / minute BP sitting:   138 / 84  (left arm) Cuff size:   regular  Vitals Entered By: Gweneth Dimitri RN (May 03, 2010 9:25 AM)  O2 Flow:  Room air CC: 5 month follow up.  Pt states breathing is unchanged from last OV - some SOB with exertion, wheezing at times, occas nonprod cough.   Comments Medications reviewed with patient Daytime contact number verified with patient. Crystal Jones RN  May 03, 2010 9:26 AM    Physical Exam  Additional Exam:  Gen obese WF          in NAD    NCAT Heent:  no jvd, no TMG, no cervical LNademopathy, orophyx clear,  nares with purulent  drainage. Cor: RRR nl s1/s2  no s3/s4  no m r h g Abd: soft NT BSA   no masses  No HSM  no rebound or guarding Ext perfused with no c v e v.d Neuro: intact, moves all 4s, CN II-XII intact, DTRs intact Chest:  coarse BS w/ few basilar rales Skin: clear  Genital/Rectal :deferred    Impression & Recommendations:  Problem # 1:  PULMONARY FIBROSIS (ICD-515) Assessment Unchanged  Stable connnective tissue disease associated pulmonary  fibrosis, cxr is stable PFTs stable compared to prior except more obstruction.    plan: No change in current prednisone dosing  Medications Added to Medication List This Visit: 1)  Nifedipine 90 Mg Xr24h-tab (Nifedipine) .... Take 1 tablet by mouth once a day 2)  Benicar Hct 40-12.5 Mg Tabs (Olmesartan medoxomil-hctz) .... Take 1 tablet by mouth once a day  Complete Medication List: 1)  Nifedipine 90 Mg Xr24h-tab (Nifedipine) .... Take 1 tablet by mouth once a day 2)  Betapace Af 120 Mg Tabs (Sotalol hcl af) .... One by mouth  two times a day 3)  Zocor 40 Mg Tabs (Simvastatin) .... One half by mouth once daily 4)  Klor-con M20 20 Meq Cr-tabs (Potassium chloride crys cr) .... Once daily 5)  Adult Aspirin Low Strength 81 Mg Tbdp (Aspirin) .... Two each morning 6)  Sulfasalazine 500 Mg Tabs (Sulfasalazine) .... Two  by mouth two times a day 7)  Plaquenil 200 Mg Tabs (Hydroxychloroquine sulfate) .... One by mouth two times a day 8)  Requip 1 Mg Tabs (Ropinirole hcl) .... One by mouth at bedtime 9)  Prednisone 5 Mg Tabs (Prednisone) .... One po once daily 10)  Methotrexate 2.5 Mg Tabs (Methotrexate sodium) .... 7 by mouth weekly 11)  Benicar Hct 40-12.5 Mg Tabs (Olmesartan medoxomil-hctz) .... Take 1 tablet by mouth once a day 12)  Furosemide 40 Mg Tabs (Furosemide) .... Once daily  Other Orders: Est. Patient Level III (08657)  Patient Instructions: 1)  No change in medications 2)  Return in      6    months    Prevention & Chronic Care Immunizations   Influenza vaccine: Fluvax 3+  (12/13/2009)    Tetanus booster: Not documented    Pneumococcal vaccine: Pneumovax (Medicare)  (01/31/2009)    H. zoster vaccine: Not documented  Colorectal Screening   Hemoccult: Not documented    Colonoscopy: Not documented  Other Screening   Pap smear: Not documented    Mammogram: Not documented    DXA bone density scan: Not documented   Smoking status: never  (05/03/2010)  Lipids    Total Cholesterol: Not documented   LDL: Not documented   LDL Direct: Not documented   HDL: Not documented   Triglycerides: Not documented    SGOT (AST): Not documented   SGPT (ALT): Not documented   Alkaline phosphatase: Not documented   Total bilirubin: Not documented  Self-Management Support :    Lipid self-management support: Not documented

## 2010-07-05 ENCOUNTER — Encounter (HOSPITAL_COMMUNITY): Payer: Medicare Other

## 2010-07-05 ENCOUNTER — Encounter (HOSPITAL_COMMUNITY): Payer: Medicare Other | Attending: Rheumatology

## 2010-07-05 DIAGNOSIS — M329 Systemic lupus erythematosus, unspecified: Secondary | ICD-10-CM | POA: Insufficient documentation

## 2010-07-19 ENCOUNTER — Encounter (HOSPITAL_COMMUNITY): Payer: Medicare Other

## 2010-07-20 ENCOUNTER — Encounter (HOSPITAL_COMMUNITY): Payer: Medicare Other

## 2010-08-02 ENCOUNTER — Encounter (HOSPITAL_COMMUNITY): Payer: Medicare Other | Attending: Rheumatology

## 2010-08-02 DIAGNOSIS — M329 Systemic lupus erythematosus, unspecified: Secondary | ICD-10-CM | POA: Insufficient documentation

## 2010-08-08 NOTE — Cardiovascular Report (Signed)
[name], [name]               ACCOUNT NO.:  [id number]   MEDICAL RECORD NO.:  [id number]          PATIENT TYPE:  OIB   LOCATION:  2899                         FACILITY:  [facility]   PHYSICIAN:  Eduardo Osier. Sharyn Lull, M.D. DATE OF BIRTH:  1940-08-08   DATE OF PROCEDURE:  DATE OF DISCHARGE:                            CARDIAC CATHETERIZATION   PROCEDURE:  Left cardiac catheterization with selective left and right  coronary angiography, visualization of left internal mammary artery  graft via right groin using Judkins technique.   INDICATIONS FOR PROCEDURE:  [name] is 70 year old white female with  past medical history significant for coronary artery disease, status  post multiple PCI.  She has PTCA stenting to obtuse marginal 2, and then  subsequently had also PTCA stenting to LAD, history of severe aortic  stenosis, status post AVR, and also LIMA to LAD in January 2006,  hypertension, hypercholesteremia, history of pleuropericarditis, history  of rheumatoid arthritis, morbid obesity, chronic anemia, complaints of  retrosternal chest pain off and on associated with shortness of breath  and feeling weak and tired and fatigued.  EKG done in the office showed  sinus bradycardia with LVH with ST depression in inferolateral leads.  The patient denies any rest or nocturnal angina.  Denies palpitation,  lightheadedness, or syncope.  Denies PND, orthopnea, leg swelling.   PAST MEDICAL HISTORY:  As above.   PAST SURGICAL HISTORY:  She had CABG and AVR in January 2006 and breast  lumpectomy many years ago and PCI to LAD and obtuse marginal 2 as above.   ALLERGIES:  She is allergic to CODEINE.  She has intolerance to CODEINE.   MEDICATION AT HOME:  1. Procardia XL  90 mg p.o. daily.  2. Avalide 300/12.5 mg p.o. daily.  3. Sotalol 120 mg q.12 h.  4. Toprol-XL 50 mg p.o. daily.  5. Enteric-coated aspirin 81 mg p.o. daily.  6. Zocor 40 mg p.o. daily.  7. Prednisone 5 mg p.o. daily.  8.  Sulfasalazine 500 mg 2 tablets twice daily.  9. Plaquenil 200 mg p.o. b.i.d.  10.Methotrexate 2.5 mg 7 tablets per week.  11.Protonix 40 mg p.o. daily.  12.Feosol 1 tablet daily.   SOCIAL HISTORY:  She is divorced, retired.  Worked for [employer] in the  past.  No history of smoking or alcohol abuse.   FAMILY HISTORY:  Noncontributory.   PHYSICAL EXAMINATION:  GENERAL:  She is alert, awake, and oriented x3.  VITAL SIGNS:  Blood pressure was 140/82, pulse was 56 and regular.  HEENT:  Conjunctivae were pink.  NECK:  Supple.  No JVD.  No bruits.  LUNGS:  Clear to auscultation without rhonchi or rales.  CARDIOVASCULAR:  S1 and S2 was normal.  There was soft systolic murmur.  ABDOMEN:  Soft.  Bowel sounds were present.  Obese, and nontender.  EXTREMITIES:  There is no clubbing, cyanosis, or edema.   IMPRESSION:  New onset angina, rule out progression of coronary artery  disease.  Coronary artery disease status post multiple percutaneous  coronary interventions in the past, subsequently had coronary artery  bypass graft and accelerated  ventricular rhythm, hypertension,  hypercholesteremia, morbid obesity, rheumatoid arthritis, history of  pleuropericarditis, chronic anemia.   I discussed with patient at length regarding left catheterization,  possible percutaneous transluminal coronary angioplasty stenting, its  risks and benefits i.e. death, myocardial infarction, stroke, need for  emergency coronary artery bypass graft, risk of restenosis, local  vascular complications, etc. and consented for the procedure.   PROCEDURE:  After obtaining informed consent, the patient was brought to  the Cath Lab and was placed on fluoroscopy table.  Right groin was  prepped and draped in the usual fashion.  A 2% Xylocaine was used for  local anesthesia in the right groin.  With the help of thin-wall needle,  a 6-French arterial sheath was placed.  The sheath was aspirated and  flushed.  Next, a  6-French left Judkins catheter was advanced over the  wire under fluoroscopic guidance up to the ascending aorta.  Wire was  pulled out.  The catheter was aspirated and connected to the manifold.  Catheter was further advanced and engaged into left coronary ostium.  Multiple views of the left system were taken.  Next, catheter was  disengaged and was pulled out over the wire and was replaced with a 6-  [place] right Judkins catheter which was advanced over the wire under  fluoroscopic guidance up to the ascending aorta.  Wire was pulled out.  The catheter was aspirated and connected to the manifold.  Catheter was  further advanced and engaged into right coronary ostium.  Multiple views  of the right system were taken.  Next, the catheter was disengaged and  was advanced over the wire under fluoroscopic guidance up to the left  subclavian artery.  This catheter was exchanged over the wire with LIMA  and diagnostic catheter which was advanced over the wire under  fluoroscopic guidance up to the left subclavian and then subsequently  engaged into the LIMA to LAD.  Multiple views of this graft were taken.  Next, catheter was disengaged and was pulled out over the wire.  Sheaths  were aspirated and flushed.   FINDINGS:  LV was not done.  Left main was patent.  LAD has 20-30% mid  in-stent restenosis and then was 100% occluded.  Diagonal 1 was small,  which was patent.  Diagonal 2 was very small which was patent.  Left  circumflex has 20-30% proximal stenosis and 30-40% bifurcation stenosis  with OM2.  OM1 was small, which was patent.  OM2 stented, proximal  portion was widely patent and has 30-40% mid bifurcation stenosis in the  inferior branch.  RCA has 15-20% proximal and mid stenosis, and 30-40%  distal stenosis just prior to bifurcation with PDA.  PDA is very small,  which is patent and PLV branches were patent.  LIMA to LAD was patent.  Native distal LAD was small and diffusely  diseased.   The patient tolerated the procedure well.  There were no complications.  The patient was transferred to the recovery room in stable condition.   PLAN:  To maximize the anti-anginal medications.      Eduardo Osier. Sharyn Lull, M.D.  Electronically Signed     MNH/MEDQ  D:  05/11/2008  T:  05/11/2008  Job:  [id number]

## 2010-08-11 NOTE — Assessment & Plan Note (Signed)
 HEALTH[facility]ARE                               PULMONARY OFFICE NOTE   NAME:Engelhard, CHRISTINAMARIE TALL                      MRN:          [id number]  DATE:12/31/2005                            DOB:          04-24-1940    Ms. Basley returns today in followup.  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   MEDICATIONS:  1. Chronic prednisone 5 mg daily.  2. Methotrexate 7 2.5 mg weekly.  3. Protonix 40 mg daily.  4. Requip 1 mg q.h.s.  5. Plaquenil 200 mg daily.  6. Sulfasalazine 1000 mg b.i.d.  7. Aspirin two 81 mg daily.  8. Toprol-XL 100 mg daily.  9. Potassium 20 mEq daily.  10.Zocor 40 mg daily.  11.Sotalol 240 mg daily.  12.Avalide 12.5 mg daily.  13.Nifedical 90 mg daily.  14.Iron two daily.   PHYSICAL EXAMINATION:  VITAL SIGNS:  Temperature 97.8, blood pressure  160/80, pulse 51, saturation 97% on room air.  CHEST:  Dry rales at the bases.  No wheeze or rhonchi noted.  CARDIAC:  Regular rate and rhythm without S3.  Normal S1 and S2.  ABDOMEN:  Soft, nontender.  EXTREMITIES:  No edema or clubbing.  SKIN:  Clear.  NEUROLOGIC:  Intact.  NECK:  No jugular venous distension.  No lymphadenopathy.  Supple.  HEENT:  Oropharynx clear.   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   PLAN:  Maintain prednisone 5 mg daily.  She was administered a flu vaccine  today in the office, and we will see the patient back in four months.       Charlcie Cradle Delford Field, MD, FCCP      PEW/MedQ  DD:  12/31/2005  DT:  01/01/2006  Job #:  [id number]

## 2010-08-11 NOTE — Discharge Summary (Signed)
[name], [name]               ACCOUNT NO.:  [id number]   MEDICAL RECORD NO.:  [id number]          PATIENT TYPE:  INP   LOCATION:  2017                         FACILITY:  [facility]   PHYSICIAN:  Salvatore Decent. Dorris Fetch, M.D.DATE OF BIRTH:  03-12-1941   DATE OF ADMISSION:  04/06/2004  DATE OF DISCHARGE:  04/15/2004                                 DISCHARGE SUMMARY   HISTORY OF PRESENT ILLNESS:  Ms. Rambeau is a 70 year old lady who is referred  in consultation to Dr. Dorris Fetch by Dr. Sharyn Lull regarding aortic stenosis.  She has a past medical history significant for coronary artery disease,  having undergone previous PTCA and stenting of her LAD and OM2.  Previously,  her history has been known for aortic stenosis, hypertension,  hyperlipidemia, rheumatoid/lupus, hypercholesterolemia, morbid obesity,  anemia, colitis, and pericarditis.  She has had intermittent chest pain and  shortness of breath for some time, but it has worsened in the previous two  months prior to admission.  It has developed into the severe range.  It is  often accompanied with chest pressure and tightness, but no nausea,  vomiting, or diaphoresis.  There is no radiation to her neck or arm.  She  gets the symptoms with minimal exertion such as walking across a room or  walking up a few stairs.  She does not have PND or orthopnea.  She does  experience swelling in her lower extremities if she has been up most of the  day.  She has had no rest or nocturnal episodes.  She denies syncope or pre-  syncope.  Thorough evaluation was undertaken including cardiac  catheterization by Dr. Sharyn Lull.  This showed a LAD lesion of 60 to 70% mid  stenosis, there was no other hemodynamically significant disease.  The peak-  to-peak aortic valve gradient was 44 mmHg.  The calculated aortic valve area  was 0.8 sq/cm.  She also was found on carotid Doppler to have a 60 to 80%  ICA stenosis.  At catheterization, her left ventricular function  was shown  to be good.  After reviewing the patient and her studies, it was Dr.  Sunday Corn recommendation to proceed with single vessel coronary artery  bypass grafting as well as aortic valve replacement.  Please see the details  of his history and physical for further information.   PAST MEDICAL HISTORY:  As afore mentioned, includes:  1.  Hypertension.  2.  Hypercholesterolemia.  3.  Gastroesophageal reflux.  4.  Arthritis.  5.  Paroxysmal atrial fibrillation.  6.  Lupus/rheumatoid arthritis.  7.  Morbid obesity.  8.  Anemia.  9.  Colitis.  10. Pericarditis.   MEDICATIONS PRIOR TO ADMISSION:  She was on 19 medications, including:  1.  Procardia XL 90 mg daily.  2.  Imdur 120 mg daily.  3.  Toprol XL 100 mg daily.  4.  Avalide 300/12.5 mg daily.  5.  Sotalol 80 mg daily.  6.  Cardura 2 mg daily.  7.  Zocor 40 mg daily.  8.  Baby aspirin 81 mg daily.  9.  Plavix 75 mg daily.  10. K-Dur 20 mEq daily.  11. Prednisone 5 mg daily.  12. Folic acid 1 mg daily.  13. Protonix 40 mg daily.  14. Sulfasalazine 500 mg two tablets b.i.d.  15. Hydroxychloroquine 200 mg p.o. b.i.d.  16. Xanax ____________p.o. b.i.d.  17. Methotrexate 2.5 mg daily.  18. Os-Cal one daily.  19. Ferrous sulfate one daily.   ALLERGIES:  CODEINE.   FAMILY HISTORY:  Please see the history and physical done at time of  admission.   SOCIAL HISTORY:  Please see the history and physical done at time of  admission.   REVIEW OF SYSTEMS:  Please see the history and physical done at time of  admission.   PHYSICAL EXAMINATION:  Please see the history and physical done at time of  admission.   HOSPITAL COURSE:  The patient was admitted electively, and on April 06, 2004, taken to the operating room where she underwent the following  procedure:  Median sternotomy, extracorporal circulation, aortic valve  replacement with 25 mm stentless porcine valve, coronary artery bypass  grafting x1 (left internal  mammary artery to the left anterior descending).  Procedure was performed Dr. Viviann Spare C. Hendrickson.  The patient tolerated  the procedure well, was taken to the surgical intensive care unit in stable  condition.   POSTOPERATIVE HOSPITAL COURSE:  The patient has had a slow, but steady  recovery.  She was weaned from the ventilator without significant  difficulties.  She has remained hemodynamically stable.  She has had  episodes of postoperative atrial fibrillation at times with rapid  ventricular response.  She has had several medications adjusted.  Due to the  paroxysmal nature of her atrial fibrillation, it was felt that the patient  would best be suited to be started on anticoagulation therapy with Coumadin  as she was both in and out the atrial fibrillation.  Currently, she is  normal sinus rhythm.  Her most recent INR dated today's date is 1.3.  It  will be checked prior to discharge and a final dosage will be determined  with followup as an outpatient.  Oxygen has been weaned and she maintains  good saturations on room air.  She has required aggressive pulmonary toilet  and diuresis.  She additionally has required some nebulizer treatments.  Her  cardiac rehab status has been slow, but steady, and she is ambulating  without significant difficulties at this time.  Incisions are all healing  well without evidence of infection.  She did have an episode of fever on  April 13, 2004, with negative urinalysis.  Clinically, it appears though  her pulmonary status was the most likely culprit for her fever, and she was  started on empiric ciprofloxacin.  She is overall quite stable and felt to  be tentatively prepared for discharge on April 15, 2004, if she remains  afebrile and other clinical status is stable on morning round re-evaluation.   CONDITION ON DISCHARGE:  Stable and improved.   FINAL DIAGNOSES: 1.  Severe aortic stenosis with single vessel coronary artery disease, now       status post aortic valve replacement and coronary artery bypass grafting      x1 as described.  2.  Postoperative atrial fibrillation.  3.  Postoperative and chronic anemia.  Most recent hematocrit dated April 14, 2004, is 26%.  4.  Other diagnoses as previously listed per the current history and      physical as dictated above.  DISCHARGE MEDICATIONS:  1.  Plaquenil 200 mg b.i.d.  2.  Aspirin 325 mg daily.  3.  Lasix 40 mg daily x5 days.  4.  K-Dur 20 mEq daily x5 days.  5.  Coumadin 5 mg daily which may be adjusted prior to discharge.  6.  Prednisone 5 mg daily.  7.  Protonix 40 mg daily.  8.  Sulfasalazine 1000 mg b.i.d.  9.  Zocor 40 mg daily.  10. Plavix 75 mg daily.  11. Betapae 120 mg b.i.d.  12. Cipro 500 mg p.o. b.i.d. for a total of 7 days.  13. Cardizem 240 mg daily.  14. Tylox one to two q.4-6h. p.r.n. pain.   DISCHARGE INSTRUCTIONS:  The patient received written instructions regarding  her medications, activity, diet, wound care, and followup.   FOLLOWUP:  1.  Dr. Sharyn Lull in two weeks.  An INR will be checked on April 17, 2004,      through his office.  2.  Dr. Dorris Fetch in three weeks with a chest x-ray from the [facility].       WEG/MEDQ  D:  04/14/2004  T:  04/14/2004  Job:  [id number]   cc:   Eduardo Osier. Sharyn Lull, M.D.  [address]  [place]  [place] [address]  Fax: [phone]

## 2010-08-11 NOTE — Assessment & Plan Note (Signed)
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 She has had increased cough. It is dry and increased wheezing,  shortness of breath, headaches, fever and chills. Noting increased  congestion. The symptoms have been going on for a week. No real mucus  production.   She maintains prednisone at 5 mg a day, Protonix 40 mg daily, methotrexate  seven 2.5 mg tablets weekly, Requip 1 mg at h.s., hydroxychloroquine 200 mg  daily, sulfasalazine 1000 mg b.i.d., aspirin 81 mg daily, Toprol XL 100 mg  daily, potassium 20 mEq daily, Zocor 40 mg daily, sotalol 120 mg 2 daily,  Avalide 12.5 mg daily, Nifedical 90 daily.   PHYSICAL EXAMINATION:  Temp is 98.0, blood pressure is 150/80, pulse is 68,  saturation was 94% on room air.  CHEST: Showed distant breath sounds with prolonged expiratory phase. No  wheeze or rhonchi noted.  CARDIAC: Showed a regular rate and rhythm without S3. Normal S1, S2.  ABDOMEN: Soft and nontender.  EXTREMITIES: Showed no edema or clubbing.  SKIN: Was clear.  NEUROLOGIC: Was intact.  HEENT: Showed no jugulovenous distention. No lymphadenopathy. Oropharynx  clear.  NECK: Supple.   Chest x-ray was obtained. Showed no acute infiltrate.   IMPRESSION:  Impression is that of acute bronchitis with no pneumonia.   PLAN:  Plan for the patient is to receive Avalox 500 mg a day for a 5 day  course and will see the patient back for return follow up.     Valerie Cradle Delford Field, MD, [facility]  Electronically Signed    PEW/MedQ  DD: 02/12/2006  DT: 02/12/2006  Job #: [id number]

## 2010-08-11 NOTE — Discharge Summary (Signed)
NAME:  [name], [name]                         ACCOUNT NO.:  [id number]   MEDICAL RECORD NO.:  [id number]                   PATIENT TYPE:  OIB   LOCATION:  6533                                 FACILITY:  [facility]   PHYSICIAN:  Mohan N. Sharyn Lull, M.D.              DATE OF BIRTH:  04-11-1940   DATE OF ADMISSION:  03/10/2002  DATE OF DISCHARGE:  03/11/2002                                 DISCHARGE SUMMARY   ADMISSION DIAGNOSES:  1. Accelerated angina, positive Persantine Cardiolite.  2. Coronary artery disease.  3. Hypertension.  4. Hypercholesterolemia.  5. Connective tissue disease with pulmonary fibrosis.  6. Chronic anemia.  7. Status post paroxysmal atrial fibrillation.  8. Morbid obesity.   DISCHARGE DIAGNOSES:  1. Accelerated angina with positive Persantine Cardiolite status post     percutaneous transluminal coronary angioplasty and stenting to mid left     anterior descending artery.  2. Coronary artery disease.  3. History of percutaneous transluminal coronary angioplasty and stenting to     obtuse marginal #1 and left anterior descending artery in the past.  4. Hypertension.  5. Hypercholesterolemia.  6. Connective tissue disease with pulmonary fibrosis.  7. Chronic anemia.  8. Status post paroxysmal atrial fibrillation with rapid ventricular     response.  9. Morbid obesity.   DISCHARGE MEDICATIONS:  1. Enteric-coated aspirin 325 mg p.o. q.d.  2. Plavix 75 mg p.o. q.d.  3. Avalide 300/12/5 mg p.o. q.d.  4. Betapace AF 80 mg 1 tablet every 12 hours.  5. Procardia XL 90 mg 1 tablet daily.  6. Imdur 120 mg 1 tablet daily.  7. Toprol XL 100 mg 1 tablet daily.  8. Zocor 20 mg 1 tablet daily at night.  9. Protonix 40 mg 1 tablet daily.  10.      Xanax 0.25 mg 1 tablet twice daily.  11.      Prednisone 5 mg 1 tablet  daily as before.  12.      Methotrexate as before.  13.      Sulfasalazine as before.  14.      Nitrostat 0.4 mg sublingual.  Use as directed.   ACTIVITY:  Avoid heavy lifting, pushing, or pulling for 48 hours.   DIET:  Low salt, low cholesterol.   SPECIAL INSTRUCTIONS:  Post angioplasty and stent instruction have been  given.   FOLLOW UP:  Follow up with me in one week.   CONDITION ON DISCHARGE:  Stable.   HISTORY OF PRESENT ILLNESS:  The patient is a 70 year old white female with  past medical history significant for coronary artery disease status post  PTCA and stenting to OM-1 in 1997 and PTCA and stenting to mid LAD in 1998,  hypertension, hypercholesterolemia, connective tissue disease with pulmonary  fibrosis, history of paroxysmal atrial fibrillation, morbid obesity, anemia  of chronic disease.  She complains of substernal chest pressure, localized,  lasting a few  minutes, relieved with rest and sublingual nitroglycerin.  States chest pressure with minimal exertion.  Denies any nausea, vomiting,  diaphoresis.  Denies PND, orthopnea, or leg swelling.  Denies palpations,  lightheadedness, or syncope.   The patient underwent Persantine Cardiolite 03/04/2002 which showed  reversible ischemia involving the anterolateral wall with EF  of 62%.   PAST MEDICAL HISTORY:  As above.   PAST SURGICAL HISTORY:  Left breast lumpectomy 20+ years ago.   FAMILY HISTORY:  Mother died of congestive heart failure at age of 44.  Father died accidental death.   SOCIAL HISTORY:  She is retired, used to work for [employer].  No history of  smoking or alcohol abuse.   HOME MEDICATIONS:  1. Procardia XL 90 mg 1 tablet daily.  2. Imdur 120 mg 1 tablet daily.  3. Toprol XL 100 mg every morning.  4. Avalide 200/12.5 mg p.o. q.d.  5. Betapace AF 80 mg q.12h.  6. Enteric-coated aspirin 1 p.o. q.d.  7. Zocor 20 mg p.o. q.h.s.  8. Cardura 2 mg p.o. q.h.s.  9. Xanax 0.25 mg p.o. q.d.  10.      Protonix 40 mg p.o. q.d.  11.      Sulfasalazine 500 mg p.o. b.i.d.  12.      Prednisone 5 mg p.o. q.d.  13.      Methotrexate 2.5 mg several  tablets weekly.  14.      Hydrochlorothiazide 25 mg p.o. q.d.  15.      Kay-Ciel 20 mEq p.o. q.d.   PHYSICAL EXAMINATION:  GENERAL:  Alert, awake, oriented x 3.  No acute  distress.  VITAL SIGNS:  Blood pressure 140/86, pulse 76 and regular.  HEENT:  Conjunctivae pink.  NECK:  Supple.  No JVD, no bruits.  LUNGS:  Clear to auscultation without rhonchi or rales.  CARDIOVASCULAR:  S1 and S2 normal.  There was a 2/6 systolic murmur.  ABDOMEN:  Soft.  Bowel sounds present, nontender.  EXTREMITIES:  No clubbing, cyanosis, or edema.  Pulses 2+.   HOSPITAL COURSE:  The patient was admitted and underwent left cardiac  catheterization with selective left and right coronary angiography,  aortography, and PTCA and stenting to mid LAD just beyond the prior  stenting.  The patient tolerated the procedure well.  There were no  complications.  The patient did not have any episodes of chest pain during  the hospital stay.  Postprocedure, CPK and MB were negative.  Platelet count  is stable.  Lung function is stable.  Groin is stable.  There is no evidence  of hematoma or bruit.  The patient has been ambulating in the hallway  without any problems.  The patient will be discharged home on the above  medications and will be followed up in my office in one week.                                                 Eduardo Osier. Sharyn Lull, M.D.    MNH/MEDQ  D:  03/11/2002  T:  03/12/2002  Job:  [id number]

## 2010-08-11 NOTE — Cardiovascular Report (Signed)
NAMEVANDA, [name]               ACCOUNT NO.:  [id number]   MEDICAL RECORD NO.:  [id number]          PATIENT TYPE:  OIB   LOCATION:  2899                         FACILITY:  [facility]   PHYSICIAN:  Eduardo Osier. Sharyn Lull, M.D. DATE OF BIRTH:  1940/05/31   DATE OF PROCEDURE:  03/30/2004  DATE OF DISCHARGE:                              CARDIAC CATHETERIZATION   PROCEDURE PERFORMED:  Left and right cardiac catheterization with selective  left and right coronary angiography, left ventriculography, insertion of  Swan-Ganz catheter, and measurement of cardiac output via right groin using  Judkins technique.   CARDIOLOGISTEduardo Osier Sharyn Lull, M.D.   INDICATIONS FOR PROCEDURE:  The patient is a 70 year old white female with a  past medical history significant for coronary artery disease, status post  PTCA and stenting to OM2 in March 1998 and had PTCA and stenting to the mid  LAD in August 1999.  She subsequently had repeat PTCA and stenting to mid  LAD in December 2003.  She has history of moderate aortic stenosis,  hypertension, history of pleural pericarditis in the past, rheumatoid  arthritis, hypercholesterolemia, morbid obesity, and anemia.  She complains  of retrosternal chest pressure radiating to the left arm with minimal  exertion, grade 3 out of 10, associated with mild shortness of breath and  relieved with rest.  She denies any nausea, vomiting, or diaphoresis.  She  denies palpitations, lightheadedness, or syncope.  She denies PND,  orthopnea, or leg swelling.  EKG done in my office showed normal sinus  rhythm with LVH with strain pattern versus anterolateral wall ischemia.  Due  to typical anginal chest pain, EKG changes, and multiple risk factors, we  discussed with the patient and her daughter regarding various options of  treatment, i.e. invasive left and right catheterization, possible PTCA and  stenting, versus AVR and CABG, versus noninvasive stress testing and 2-D  echo, and  she gave consent for invasive procedure.   PAST MEDICAL HISTORY:  As above.   PAST SURGICAL HISTORY:  She had left breast lumpectomy in the past.   ALLERGIES:  CODEINE intolerance.   MEDICATIONS:  At home, she is on multiple medications:  1.  Procardia XL 90 mg p.o. daily.  2.  Imdur 120 mg p.o. each morning.  3.  Toprol XL 100 mg p.o. daily.  4.  Avalide 300/12.5 mg p.o. daily.  5.  Sotalol 80 mg p.o. b.i.d.  6.  Cardura 2 mg p.o. daily.  7.  Zocor 40 mg p.o. at bedtime.  8.  Baby aspirin 81 mg p.o. daily.  9.  Plavix 75 mg p.o. daily.  10. K-Dur 20 mEq p.o. daily.  11. Prednisone 5 mg p.o. daily.  12. Folic acid 1 mg p.o. daily.  13. Protonix 40 mg p.o. daily.  14. Sulfasalazine 500 mg 2 p.o. b.i.d.  15. Hydrochloroquine 200 mg p.o. b.i.d.  16. Xanax 0.25 mg p.o. b.i.d.  17. Methotrexate 2.5 mg p.o. daily.  18. Os-Cal one tablet daily.  19. Feosol one tablet daily.   SOCIAL HISTORY:  She is divorced.  Worked for [employer] in  the past.  No  smoking or alcohol abuse.   FAMILY HISTORY:  Mother died of complications of congestive heart failure at  the age of 45.  Father died of accidental death.  One brother and one sister  in good health.   PHYSICAL EXAMINATION:  GENERAL:  Awake, alert and oriented times 3 in no  acute distress.  VITAL SIGNS:  Blood pressure 146/90.  Pulse 64 and regular.  EENT:  Conjunctivae pink.  NECK:  Supple.  No JVD.  No bruits.  LUNGS:  Clear to auscultation without rhonchi or rales.  CARDIOVASCULAR:  S1, S2 normal.  There was a 3/6 systolic ejection murmur of  aortic stenosis.  There was a soft S4 gallop.  ABDOMEN:  Soft.  Bowel sounds present.  Obese, nontender.  EXTREMITIES:  There is no cyanosis, clubbing or edema.   IMPRESSION:  1.  New onset angina and exertional dyspnea, rule out coronary      insufficiency, rule out worsening aortic stenosis.  2.  History of moderate aortic stenosis.  3.  Uncontrolled hypertension.  4.  Coronary  artery disease, status post percutaneous coronary intervention      to obtuse marginal 1 and left anterior descending in the past as above.  5.  Hypercholesterolemia.  6.  Rheumatoid arthritis.  7.  History of pleural pericarditis.  8.  History of paroxysmal atrial fibrillation in the past.  9.  Morbid obesity.  10. Chronic anemia.   DISCUSSION:  I discussed with the patient and her daughter various options  of treatment as above and the risks and benefits, i.e. death, MI, stroke,  need for emergency CABG, risk of restenosis, local vascular complications,  etcetera.  They consented for percutaneous coronary intervention.   DESCRIPTION OF PROCEDURE:  After obtaining informed consent, the patient was  brought to the cath lab and was placed on the fluoroscopy table.  The right  groin was prepped and draped in usual fashion.  Xylocaine 2% was used for  local anesthesia in the right groin.  With the help of a thin-wall needle, a  6-French arterial and 8-French venous sheath were placed.  Both sheaths were  aspirated and flushed.  Next, a 7-French Thermodilution Swan-Ganz catheter  was advanced under fluoroscopic guidance up to the RA, RV, and PA into wedge  position.  Pressures were recorded in all of those locations.  Next, cardiac  outputs were done, and saturations were measured from PA and aorta.   Next the pigtail catheter was advanced over the wire under fluoroscopic  guidance up to the ascending aorta.  The catheter was further advanced  across the aortic valve into the left ventricle.  LV pressures were  recorded.  Next, left ventriculogram was done in 30-degree RAO position.  Post scintigraphic pressures were recorded from LV and then pullback  pressures were recorded from the aorta.  There was significant gradient of  approximately 45 to 50 mmHg.  Next simultaneous LV and wedge, LV and PA, LV and RV, LV and RA pressures were recorded.  Next, the Swan-Ganz catheter was  pulled  out.  The sheaths were aspirated and flushed.   Next, a 6-French left Judkins catheter was advanced over the wire under  fluoroscopic guidance up to the ascending aorta.  The wire was pulled out,  and the catheter was aspirated and connected to the manifold.  The catheter  was further advanced and engaged into the left coronary ostium.  Multiple  views of the left system  were taken.  Next, the catheter was disengaged and  pulled out over the wire.  It was replaced with a 6-French right Judkins  catheter, which was advanced over the wire under fluoroscopic guidance up to  the ascending aorta.  The wire was pulled out.  The catheter was aspirated  and connected to the manifold.  The catheter was further advanced and  engaged into the right coronary ostium.  Multiple views of the right system  were obtained.  Next, the catheter was disengaged and pulled out over the  wire.  Sheaths were aspirated and flushed.   FINDINGS:  1.  Left ventricle showed good left ventricular systolic function, LVH.      There was some mild catheter-induced MR.  2.  Left main was patent.  LAD had 30 to 40% mid in-stent restenosis and 60      to 70% distal stenosis.  Diagonal 1 was small and had 30 to 40% ostial      stenosis.  Diagonal 2 and 3 were very, very small.  Left circumflex was      patent and tapered down in the AV groove.  Left circumflex had minimal      plaquing proximally and then tapered down in the AV groove after giving      off large OM2.  OM1 was small and patent.  OM2 was large, which was      patent at prior PTCA and stented site.  RCA has 15 to 20% proximal      stenosis.  3.  On Swan-Ganz readings, RA pressure 13.  RV 41/5.  PA 44/26.  Pulmonary      capillary wedge pressure 19.  Cardiac output 5 L/min.  Peak-to-peak      gradient across the aortic valve was 44 mmHg.  Calculated aortic valve      area 0.87 cm2.   The patient tolerated the procedure well.  There were no complications.   Will get cardiothoracic surgery consult for possible aortic valve  replacement, possible left internal mammary artery to left anterior  descending.  The patient is on Plavix.  The patient probably will be  discharged home once seen by cardiothoracic surgery and will be scheduled  for surgery as an outpatient.  Will discuss with the patient and family.       MNH/MEDQ  D:  03/30/2004  T:  03/30/2004  Job:  [id number]

## 2010-08-11 NOTE — Assessment & Plan Note (Signed)
[facility]                             PULMONARY OFFICE NOTE   NAME:Bruins, [name]                      MRN:          [id number]  DATE:05/09/2006                            DOB:          July 21, 1940    [name] returns today in followup, 70 year old white female history of  mixed connected tissue disease, pulmonary fibrosis, pneumonitis, here  today for pulmonary followup. She is having no active respiratory  complaints, no cough, congestion, or wheezing. She is back to,  1. Predinsone 5 mg a day.  2. Protonix 40 mg a day.  3. __________  90 mg daily.  4. Avalide 12.5 mg daily.  5. Sotalol 120 mg 2 daily.  6. Zocor 40 mg daily.  7. Potassium 20 meq daily.  8. Toprol XL 100 mg daily.  9. Aspirin two 81 mg daily.  10.Sulfasalazine 1000 mg b.i.d.  11.Hydroxychloroquine 200 mg daily.  12.Requip 1 mg daily.  13.Methotrexate 7.5 mg, seven daily.  14.Iron 2 daily.   On exam, temperature 97, blood pressure 120/70, pulse 58, saturation 98%  room air.  CHEST: Showed diminished breath sounds with no evidence of wheeze,  rales, or rhonchi.  CARDIAC EXAM: Showed a regular rate and rhythm without S3, normal S1,  S2.  ABDOMEN: Soft, nontender.  EXTREMITIES: Showed no edema or clubbing.  SKIN: Clear.  NEUROLOGIC EXAM: Intact.  HEENT EXAM: Showed no jugular venous distension, no lymphadenopathy,  oropharynx clear.  NECK: Supple.   IMPRESSION:  That of stable pulmonary fibrosis with no active  respiratory complaints.   PLAN:  For the patient to maintain;  1. Predinsone 5 mg a day.  2. Protonix 40 mg a day.  And we will  see the patient back in follow up in 5 months.     Charlcie Cradle Delford Field, MD, [facility]  Electronically Signed    PEW/MedQ  DD: 05/09/2006  DT: 05/09/2006  Job #: [id number]

## 2010-08-11 NOTE — Cardiovascular Report (Signed)
NAME:  [name], [name]                         ACCOUNT NO.:  [id number]   MEDICAL RECORD NO.:  [id number]                   PATIENT TYPE:  OIB   LOCATION:  6533                                 FACILITY:  [facility]   PHYSICIAN:  Mohan N. Sharyn Lull, M.D.              DATE OF BIRTH:  1940-10-24   DATE OF PROCEDURE:  03/10/2002  DATE OF DISCHARGE:  03/11/2002                              CARDIAC CATHETERIZATION   PROCEDURE:  1. Left cardiac catheterization with selective left and right coronary     angiography, left ventriculography, aortography via right groin using     Judkins technique.  2. Successful percutaneous transluminal coronary angioplasty to mid left     anterior descending artery using 3.0 x 10-mm long CrossSail balloon.  3. Successful deployment of 3.0 x 18-mm long CYPHER drug-eluting stent in     mid left anterior descending artery.   INDICATIONS FOR PROCEDURE:  The patient is a 70 year old white female with a  past medical history significant for coronary artery disease status post  PTCA and stenting to OM-1 and mid LAD in 1997 and 1998, hypertension,  hypercholesterolemia, connective tissue disease with pulmonary fibrosis,  history of paroxysmal atrial fibrillation with rapid ventricular response,  morbid obesity, anemia of chronic disease, complains of retrosternal chest  pressure localized lasting for a few minutes, relieved with rest and  sublingual nitroglycerin.  She states she gets chest pressure with minimal  exertion.  Denies any nausea, vomiting, diaphoresis.  Denies PND, orthopnea,  leg swelling, denies palpitations, lightheadedness, or syncopal episodes.  The patient underwent Persantine Cardiolite on March 04, 2002, which  showed reversible ischemia involving the anterolateral wall with EF of 63%.  Due to typical anginal pain and positive Persantine Cardiolite, she was  advised for left catheterization/possible PCI.   DESCRIPTION OF PROCEDURE:  After  obtaining the informed consent, the patient  was brought to the catheterization lab and was placed on the fluoroscopy  table.  The right groin was prepped and draped in the usual fashion.  Xylocaine, 2%, was used for local anesthesia in the right groin.  With the  help of a thin-walled needle, a #6 French arterial sheath was placed.  The  sheath was aspirated and flushed.  Next, a #6 French left Judkins catheter  was advanced over the wire under fluoroscopic guidance up to the ascending  aorta.  The wire was pulled out.  The catheter was aspirated and connected  to the manifold.  The catheter was further advanced and engaged into the  left coronary ostium.  Multiple views of the left system were taken.  Next,  the catheter was disengaged and was pulled out over the wire and was  replaced with a #6 [place] right Judkins catheter which was advanced over the  wire under fluoroscopic guidance up to the ascending aorta.  The wire was  pulled out.  The catheter was aspirated and connected  to the manifold.  The  catheter was further advanced and engaged into the right coronary ostium.  Multiple views of the right system were taken.  Next, the catheter was  disengaged and was pulled out over the wire and was replaced with a #6  French pigtail catheter which was advanced over the wire under fluoroscopic  guidance up to the ascending aorta.  The wire was pulled out.  The catheter  was aspirated and connected to the manifold.  The catheter was further  advanced across the aortic valve into the LV.  LV pressures were recorded.  Next, left ventriculography was done in 30-degree RAO position.  Post  angiographic pressures were recorded from LV and then pullback pressures  were recorded from the aorta.  There was no gradient across the aortic  valve.  Next, the pigtail catheter was pulled down into the abdominal aorta.  Aortography was done in PA position.  Next, the catheter was pulled out over  the wire.   Sheaths were aspirated and flushed.   FINDINGS:  1. The left ventricle showed good left ventricular systolic function.  There     was moderate to severe left ventricular hypertrophy.  2. The left main was patent.  3. The left anterior descending artery has focal 70% mid stenosis beyond the     stent and 50-60% distal stenosis.  Diagonal #1 is small which is patent.     Diagonal #2 is very, very small.  4. The left circumflex has 20% proximal stenosis.  Obtuse marginal #1 is     very, very small which is patent.  Obtuse marginal #2 proximally stented     site is patent.  Obtuse marginal #3 bifurcates into superior and inferior     branch which has 40-50% lesion at the bifurcation.  5. The right coronary artery has 20-30% proximal stenosis.  6. The aortography showed no evidence of abdominal aortic aneurysm.     Bilateral renal arteries appear to be patent.   INTERVENTIONAL PROCEDURE:  Successful PTCA to mid LAD was done using 3.0 x  10-mm long CrossSail balloon for predilatation and then 3.0 x 18-mm long  CYPHER drug-eluting stent was deployed at 11 atmospheres of pressure which  was fully expanded going up to 15 atmospheres of pressure.  The lesion was  dilated from 70% to 0% residual with excellent TIMI grade 3 distal flow  without evidence of dissection or distal embolization.  The patient received  weight-based heparin, Integrilin, and Plavix during the procedure.  The  patient tolerated the procedure well.  There were no complications.  The  patient was transferred to the recovery room in stable condition.                                               Eduardo Osier. Sharyn Lull, M.D.    MNH/MEDQ  D:  03/11/2002  T:  03/11/2002  Job:  [id number]   cc:   Cardiac Catheterization Lab

## 2010-08-11 NOTE — Assessment & Plan Note (Signed)
[facility]                             PULMONARY OFFICE NOTE   NAME:[name], PAULEEN GOLEMAN                      MRN:          [id number]  DATE:02/21/2006                            DOB:          1940/09/02    Ms. Herrmann returns today after being seen on November 20.  She is still  having dry cough, still short of breath, wheezing, and shortness of  breath noted.  She took 5 days of Avelox without significant  improvement.  She maintains prednisone 5 mg daily, methotrexate seven  2.5-mg weekly, Protonix 40 mg daily, ReQuip 1 mg h.s., Plaquenil 200 mg  daily, sulfasalazine 1000 mg b.i.d., aspirin 81 mg two daily, Toprol-XL  100 mg daily, potassium daily, Zocor 40 mg daily, sotalol two 120-mg  daily, Avalide 12.5 mg daily, Nifedical 90 mg daily.   EXAMINATION:  VITAL SIGNS:  Temperature 98, blood pressure 150/84, pulse  62, saturation 94% in room air.  CHEST:  Showed inspiratory and expiratory wheeze, no significant  prominent pseudowheeze was detected.  Rhonchi were noted at the bases,  rales at the bases as well.  CARDIAC:  Exam showed a regular rate and rhythm without S3, normal S1  and S2.  ABDOMEN:  Was protuberant, bowel sounds were active.  EXTREMITIES:  Showed no edema or clubbing or venous disease.  SKIN:  Clear.   IMPRESSION:  That of acute bronchitis with ongoing bronchospasm,  increased lower airway inflammation.   PLAN:  For the patient to pulse prednisone up to 40 mg a day, taper down  to 10 mg over four days until she gets to 5 mg a day and hold.  Tussionex p.r.n. cough was prescribed.  Protonix will be increased to 40  mg b.i.d. for two weeks, then reduced back down to 40 mg daily  thereafter.  A trial of Symbicort at 160/4.5 two sprays b.i.d. is  prescribed, and will see the patient back in followup in a month's time.     Charlcie Cradle Delford Field, MD, [facility]  Electronically Signed    PEW/MedQ  DD: 02/21/2006  DT: 02/22/2006  Job #: [id number]   cc:   Areatha Keas, M.D.

## 2010-08-11 NOTE — Op Note (Signed)
[name], [name]               ACCOUNT NO.:  [id number]   MEDICAL RECORD NO.:  [id number]          PATIENT TYPE:  INP   LOCATION:  2017                         FACILITY:  [facility]   PHYSICIAN:  Salvatore Decent. Dorris Fetch, M.D.DATE OF BIRTH:  Dec 10, 1940   DATE OF PROCEDURE:  04/06/2004  DATE OF DISCHARGE:                                 OPERATIVE REPORT   PREOPERATIVE DIAGNOSIS:  Severe aortic stenosis and single-vessel coronary  artery disease.   POSTOPERATIVE DIAGNOSIS:  Severe aortic stenosis and single-vessel coronary  artery disease.   PROCEDURE:  Median sternotomy, extracorporal circulation, aortic valve  replacement with 25 mm stentless porcine valve, coronary artery bypass  grafting x1 (left internal mammary artery to left anterior descending  artery).   SURGEON:  Salvatore Decent. Dorris Fetch, M.D.   ASSISTANT:  Rowe Clack, P.A.-C.   ANESTHESIA:  General.   FINDINGS:  Aortic stenosis with calcification of the leaflets, mild annular  calcification.  LAD 1.3 mm, fair quality target.  Mammary, good quality  target.  Left ventricular hypertrophy and dilatation.  Post bypass  transesophageal echocardiography showed no aortic insufficiency and good  function of the prostatic valve.   CLINICAL NOTE:  [name] is a 70 year old lady with a history of coronary  disease, who had been followed with moderate aortic stenosis.  She now  presents with exertional chest pain and dyspnea with even mild-to-moderate  exertion.  At catheterization, she had single-vessel coronary disease  involving her LAD and severe aortic stenosis with a valve area in the 0.8 cm  range.  The patient was seen and evaluated.  The surgical options for aortic  valve replacement and coronary artery bypass grafting were discussed.  She  understood the indications, risks, benefits and alternatives.  She did  understand the high risk, given her size, steroid use, and other  comorbidities.  She also understood  recommendation for tissue valve and  agreed to proceed with tissue valve replacement.   OPERATIVE NOTE:  [name] was brought to the preoperative holding area on  April 06, 2004.  Arterial lines were placed to monitor her arterial and  pulmonary arterial pressure.  EKG leads were placed for continuous  telemetry.  Intravenous antibiotics were administered.  The patient was  taken to the operating room anesthetized and intubated.  A Foley catheter  was placed.  The chest, abdomen, and legs were prepped and draped in the  usual fashion.   A median sternotomy was performed.  The left internal mammary artery was  harvested in the standard fashion.  A full dose of heparin was administered  prior to dividing the distal and the mammary artery.  There was excellent  flow through the cut end of the vessel.   The pericardium was opened.  The ascending aorta was inspected and palpated.  After assuring adequate anticoagulation with ACT measurement with  accommodation made for Aprotinin use.  The aorta was cannulated to be  concentric to where the pledget was .  Purse-string sutures and a dual-stage  venous cannula was placed via a purse-string suture in the right atrial  appendage.  Cardiopulmonary bypass was instituted, and the patient was  cooled to 32 degrees Celsius.  The coronary arteries were inspected, and  anastomotic sites were chosen.  The conduit was inspected and cut to length.  A foam pad was placed in the pericardium to protect the left phrenic nerve.  A temperature probe was placed in the myocardial septum.  A cardioplegic  cannula was placed in the ascending aorta.  A left ventricular vent was  placed via purse-string suture to the right superior pulmonary vein and  directed into the left ventricle.  A retrograde cardioplegia cannula was  placed via purse-string suture in the right atrium and directed into the  coronary sinus.  The aorta was cross-clamped.  The left ventricle  was  emptied via recruitment and the left ventricular vent.  Cardiac arrest was  achieved with cold antegrade and retrograde blood cardioplegia and  topicalized saline.  After achieving adequate myocardial septal cooling and  a complete diastolic arrest, the following distal anastomoses were  performed:  First, a window was made in the pericardium.  The left internal  mammary artery was brought through the window, and the distal lumen was  spatulated.  The pin was anastomosed end-to-side through the distal LAD.  The LAD was a 1.3 mm fair-quality target at the site of the anastomosis.  The anastomosis was performed with a running 8-0 Prolene suture.  At the  completion of the anastomosis, the bulldog clamp was briefly removed.  Immediate and rapid septal rewarming was noted.  The bulldog clamp was  replaced.  The mammary pedicle was tacked to the epicardial surface of the  heart with 6.0 Prolene sutures.   Additional cardioplegia was administered.  An aortotomy was performed just  above the sinotubular junction.  The valve was inspected.  It was a  trileaflet, stenotic, and insufficient valve with heavy calcification of the  valve leaflets but only mild-to-moderate calcification of the annulus.  The  valve leaflets were excised and sent for pathology.  The annulus was  relatively small to the patient's extremely large size.  The annulus  probably would have accepted around a 21 pericardial valve, and this was not  adequate for the patient's PSA.  It did size for a 25 [place] stentless  porcine valve at both the sinotubular  junction and the annulus.  The  aortotomy was completed posteriorly.   Additional cardioplegia was administered at 20 minute intervals and is  needed for myocardial symptoms greater than 15 degrees Celsius throughout  the cross clamp portion of the procedure.   The proximal row of sutures were placed, and 4-0 Ethibond sutures were used. These were placed through the  lowest point in the annulus and then kept in a  single plane, keeping the sutures below the levels of the commissures  circumferentially.  After the sutures had been placed circumferentially and  the valve had been washed according to manufacturer's recommendations, the  sutures were placed through the proximal cuff of the stentless valve.  The  valve was lowered into placed, and the sutures were sequentially tied.  The  left main and right coronary orifices were inspected.  These were well  positioned for placement of the stentless valve.  The distal suture line on  the stentless valve then was performed with a running 4-0 Prolene suture.  This was started below each of the coronary orifices and run through the top  of the commissures.  An in-and-out technique was used on the noncoronary  sinus.  After  completing the distal suture line, the valve was inspected.  There was good apposition of the leaflets.  Rewarming was begun.  The  aortotomy was then closed with a running 4-0 Prolene suture in a continuous  fashion.  After completing the suture line but before tying it anteriorly,  the patient was placed in Trendelenburg position.  Lidocaine was  administered.  Deairing maneuvers were performed, and the aortic cross clamp  was removed.  The bulldog clamp was removed from the mammary artery.  Immediate and rapid septal rewarming was noted before removing the cross  clamp.  The total cross clamp time was 104 minutes.   Hemostasis was then achieved at the aortotomy.  Several 4-0 Prolene  horizontal pledgeted mattress sutures were required to achieve good  hemostasis.  The distal anastomosis was also inspected for hemostasis.  While the patient was being rewarmed, epicardial pacing wires were placed on  the right ventricle and right atrium.  When the patient had been rewarmed to  a core temperature of 37 degrees Celsius, she was weaned from  cardiopulmonary bypass.  The total bypass time was  174 minutes.  The initial  cardiac index was greater than 2 L/min/m2, and the patient remained  hemodynamically stable throughout the post-bypass period.   Post-bypass transesophageal echocardiography revealed good function of the  prostatic valve with no aortic insufficiency.   A test dose of protamine was administered and was well tolerated.  CoSeal  was applied to the proximal aortic suture line.  The chest was irrigated  with one liter of warm normal saline containing 1 gm of vancomycin.  Hemostasis was achieved.  The left pleural and two mediastinal chest tubes  were placed through separate subcostal incisions.  The pericardium was  reapproximated with interrupted 3-0 silk sutures.  The chest was closed with  interrupted heavy-gauge stainless steel wires.  The patient tolerated  sternal closure without difficulty.  The remainder of the incisions were  closed in standard fashion.  Subcuticular closure was used on the skin.  All sponge, needle, and instrument counts were correct at the end of the  procedure.  The patient was transported from the operating room to the  surgical intensive care unit in critical but stable condition.       SCH/MEDQ  D:  04/10/2004  T:  04/10/2004  Job:  [id number]   cc:   Eduardo Osier. Sharyn Lull, M.D.  [address]  [place]  [place] [address]  Fax: [phone]

## 2010-08-11 NOTE — Op Note (Signed)
[name], [name]               ACCOUNT NO.:  [id number]   MEDICAL RECORD NO.:  [id number]          PATIENT TYPE:  INP   LOCATION:  2304                         FACILITY:  [facility]   PHYSICIAN:  Kaylyn Layer. Michelle Piper, M.D.   DATE OF BIRTH:  [birth year]   DATE OF PROCEDURE:  04/06/2004  DATE OF DISCHARGE:                                 OPERATIVE REPORT   PROCEDURES:  Transesophageal echocardiographic probe insertion and  monitoring during aortic valve replacement and coronary artery bypass  grafting.   DESCRIPTION OF PROCEDURE:  I was consulted by Salvatore Decent. Dorris Fetch, M.D.,  to place a transesophageal echocardiographic probe into Ms. Broxterman for  intraoperative evaluation and monitoring during aortic valve replacement.   After uneventful induction of anesthesia and endotracheal intubation, the  transesophageal echocardiographic probe was easily passed into the stomach.  Initial short axis view of the left ventricle showed mild left ventricular  hypertrophy with no wall motion abnormalities in any quadrants.  Turning to  the four-chamber view the mitral valve was seen and the leaflets clapped  normally in all views.  On placing color flow Doppler across the valve,  there was between trace to 1+ mitral regurgitation.  The left atrium was  normal.  Flow in the pulmonary veins was forward.  The intra-atrial septum  was normal.  Turning to the aortic valve, the valve was heavily calcified.  It was tricuspid and markedly diminished valve area.  Placing color flow  Doppler across the valve in longitudinal view, trace to 1+ aortic  insufficiency could be seen, as well as turbulent aortic stenosis.  The  valve annulus measured 2.73 cm.  The patient was placed on cardiopulmonary  bypass by Dr. Dorris Fetch and underwent an aortic valve replacement with a  __________ valve, as well as coronary artery bypass grafting.   Immediately prior to discontinuing cardiopulmonary bypass, I did an  evaluation for  intracardiac air.  There was a minimal amount which  diminished rapidly with air reducing technique.  The replaced aortic valve  could be seen in situ, however, it was difficult to image it.  The aortic  insufficiency was no longer there in the aortic outflow tract and the aortic  stenosis turbulence was reduced.  The left ventricle was unchanged in short  axis view.  There were no wall motion abnormalities.  On looking in the four-  chamber view, the mitral valve was unchanged from preoperatively.  There was  still trace to 1+ mitral regurgitation.  The left atrium was normal.  The  intra-atrial septum was normal.  The right side of the heart was normal.   The transesophageal probe was removed at the end of the procedure without  problem and the patient was taken to the intensive care unit in stable  condition, intubated.       KDO/MEDQ  D:  04/06/2004  T:  04/06/2004  Job:  [id number]   cc:   Anesthesia Department

## 2010-08-20 ENCOUNTER — Other Ambulatory Visit: Payer: Self-pay | Admitting: Critical Care Medicine

## 2010-08-30 ENCOUNTER — Encounter (HOSPITAL_COMMUNITY): Payer: Medicare Other | Attending: Rheumatology

## 2010-08-30 DIAGNOSIS — M329 Systemic lupus erythematosus, unspecified: Secondary | ICD-10-CM | POA: Insufficient documentation

## 2010-09-26 ENCOUNTER — Encounter (HOSPITAL_COMMUNITY): Payer: Medicare Other | Attending: Rheumatology

## 2010-09-26 DIAGNOSIS — M329 Systemic lupus erythematosus, unspecified: Secondary | ICD-10-CM | POA: Insufficient documentation

## 2010-10-04 ENCOUNTER — Encounter: Payer: Self-pay | Admitting: *Deleted

## 2010-10-04 ENCOUNTER — Other Ambulatory Visit: Payer: Self-pay | Admitting: Critical Care Medicine

## 2010-10-04 ENCOUNTER — Ambulatory Visit (INDEPENDENT_AMBULATORY_CARE_PROVIDER_SITE_OTHER): Payer: Medicare Other | Admitting: Critical Care Medicine

## 2010-10-04 DIAGNOSIS — J8410 Pulmonary fibrosis, unspecified: Secondary | ICD-10-CM

## 2010-10-04 NOTE — Progress Notes (Signed)
Subjective:    Patient ID: [name], female    DOB: 1940-04-06, 70 y.o.   MRN: [id number]  HPI 70 -year-old white female history of mixed connected tissue disease, pulmonary fibrosis, pneumonitis,  December 13, 2009 3:23 PM  Dyspnea is the same. No cough until the last few days. No mucus. No chest pain. No f/c/s.  No stamina as before .  Pt denies any significant sore throat, nasal congestion or excess secretions, fever, chills, sweats, unintended weight loss, pleurtic or exertional chest pain, orthopnea PND, or leg swelling  Pt denies any increase in rescue therapy over baseline, denies waking up needing it or having any early am or nocturnal exacerbations of coughing/wheezing/or dyspnea.  May 03, 2010 9:32 AM  No real changes. No real change in breathing, no cough  Pt denies any significant sore throat, nasal congestion or excess secretions, fever, chills, sweats, unintended weight loss, pleurtic or exertional chest pain, orthopnea PND, or leg swelling  Pt denies any increase in rescue therapy over baseline, denies waking up needing it or having any early am or nocturnal exacerbations of coughing/wheezing/or dyspnea.  CXR:  07/29/2009:  CXR Results: IMPRESSION:  1. Stable cardiomegaly.  2. Very faint prominence of the interstitium.  07/03/2007:  CXR Results: IMPRESSION:  1. Mild cardiac enlargement, stable.  2. Chronic lung changes but no acute pulmonary findings.  PFT's:  12/13/2009:  DLCO %Predicted: 47  FEF 25/75 %Predicted: 63  FEV1 %Predicted: 89  FVC %Predicted: 85  Post Spirometry FEF 25/75 %Predicted: 84  Post Spirometry FEV1 %Predicted: 95  Post Spirometry FVC %Predicted: 86  07/16/2007:  DLCO %Predicted: 46  FEF 25/75 %Predicted: 73  FEV1 %Predicted: 89  FVC %Predicted: 83  Post Spirometry FEF 25/75 %Predicted: 90  Post Spirometry FEV1 %Predicted: 93  Post Spirometry FVC %Predicted: 84  RV %Predicted: 86  TLC %Predicted: 84   10/04/2010 Still  wheezing and tightness.   Dry cough .  No chest pain .  If takes a deep breath will hurt.   Pt denies any significant sore throat, nasal congestion or excess secretions, fever, chills, sweats, unintended weight loss, pleurtic or exertional chest pain, orthopnea PND, or leg swelling Pt denies any increase in rescue therapy over baseline, denies waking up needing it or having any early am or nocturnal exacerbations of coughing/wheezing/or dyspnea. Pt also denies any obvious fluctuation in symptoms with  weather or environmental change or other alleviating or aggravating factors  Past Medical History  Diagnosis Date  . Coronary artery disease   . Atrial fibrillation     Now NSR  . GERD (gastroesophageal reflux disease)   . Hyperlipidemia   . 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 No pertinent family history.   History   Social History  . Marital Status: Divorced    Spouse Name: N/A    Number of Children: N/A  . Years of Education: N/A   Occupational History  . Not on file.   Social History Main Topics  . Smoking status: Never Smoker   . Smokeless tobacco: Never Used  . Alcohol Use: Not on file  . Drug Use: Not on file  . Sexually Active: Not on file   Other Topics Concern  . Not on file   Social History Narrative  . No narrative on file     Allergies  Allergen Reactions  . Codeine     REACTION: nausea  . Codeine Phosphate  REACTION: unspecified     Outpatient Prescriptions Prior to Visit  Medication Sig Dispense Refill  . predniSONE (DELTASONE) 5 MG tablet TAKE 1 TABLET BY MOUTH EVERY DAY  60 tablet  3      Review of Systems Constitutional:   No  weight loss, night sweats,  Fevers, chills, fatigue, lassitude. HEENT:   No headaches,  Difficulty swallowing,  Tooth/dental problems,  Sore throat,                No sneezing, itching, ear ache, nasal congestion, post nasal drip,   CV:  No chest pain,  Orthopnea, PND,  swelling in lower extremities, anasarca, dizziness, palpitations  GI  No heartburn, indigestion, abdominal pain, nausea, vomiting, diarrhea, change in bowel habits, loss of appetite  Resp: Notes  shortness of breath with exertion not at rest.  No excess mucus, no productive cough,  No non-productive cough,  No coughing up of blood.  No change in color of mucus.  No wheezing.  No chest wall deformity  Skin: no rash or lesions.  GU: no dysuria, change in color of urine, no urgency or frequency.  No flank pain.  MS:  No joint pain or swelling.  No decreased range of motion.  No back pain.  Psych:  No change in mood or affect. No depression or anxiety.  No memory loss.     Objective:   Physical Exam Filed Vitals:   10/04/10 0900  BP: 150/88  Pulse: 56  Temp: 98 F (36.7 C)  TempSrc: Oral  Height: 5\' 7"  (1.702 m)  Weight: 233 lb 12.8 oz (106.051 kg)  SpO2: 96%    Gen: Pleasant, well-nourished, in no distress,  normal affect  ENT: No lesions,  mouth clear,  oropharynx clear, no postnasal drip  Neck: No JVD, no TMG, no carotid bruits  Lungs: No use of accessory muscles, no dullness to percussion,dry rales  Cardiovascular: RRR, heart sounds normal, no murmur or gallops, no peripheral edema  Abdomen: soft and NT, no HSM,  BS normal  Musculoskeletal: No deformities, no cyanosis or clubbing  Neuro: alert, non focal  Skin: Warm, no lesions or rashes        Assessment & Plan:   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 aspirin 81 MG tablet Take 81 mg by mouth daily.        . Belimumab (BENLYSTA IV) Inject  into the vein. Once monthly       . BENICAR HCT 40-12.5 MG per tablet Once daily      . furosemide (LASIX) 40 MG tablet Once daily      . hydroxychloroquine (PLAQUENIL) 200 MG tablet Twice daily      . KLOR-CON M20 20 MEQ tablet Once daily      . methotrexate (RHEUMATREX) 2.5 MG tablet once a week. 7 by mouth weekly       . NIFEdipine (PROCARDIA XL/ADALAT-CC) 90 MG 24 hr tablet Once daily      . pantoprazole (PROTONIX) 40 MG tablet Once daily      . predniSONE (DELTASONE) 5 MG tablet TAKE 1 TABLET BY MOUTH EVERY DAY  60 tablet  3  . rOPINIRole (REQUIP) 1 MG tablet Daily at bedtime      . simvastatin (ZOCOR) 40 MG tablet daily      . sotalol (BETAPACE) 120 MG tablet Two times daily      . sulfaSALAzine (AZULFIDINE) 500 MG tablet 2 tablets twice daily

## 2010-10-04 NOTE — Patient Instructions (Signed)
No change in medications. Return in        6 months        

## 2010-10-04 NOTE — Assessment & Plan Note (Signed)
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

## 2010-10-25 ENCOUNTER — Encounter (HOSPITAL_COMMUNITY): Payer: Medicare Other | Attending: Rheumatology

## 2010-10-25 DIAGNOSIS — M329 Systemic lupus erythematosus, unspecified: Secondary | ICD-10-CM | POA: Insufficient documentation

## 2010-10-27 ENCOUNTER — Encounter (HOSPITAL_COMMUNITY): Payer: Medicare Other

## 2010-11-24 ENCOUNTER — Encounter (HOSPITAL_COMMUNITY): Payer: Medicare Other

## 2010-11-29 ENCOUNTER — Inpatient Hospital Stay (HOSPITAL_COMMUNITY)
Admission: AD | Admit: 2010-11-29 | Discharge: 2010-12-06 | DRG: 310 | Disposition: A | Discharge status: Home / Self Care | Payer: Medicare Other | Source: Ambulatory Visit | Attending: Cardiology | Admitting: Cardiology

## 2010-11-29 ENCOUNTER — Inpatient Hospital Stay (HOSPITAL_COMMUNITY): Payer: Medicare Other

## 2010-11-29 DIAGNOSIS — Z954 Presence of other heart-valve replacement: Secondary | ICD-10-CM

## 2010-11-29 DIAGNOSIS — I2510 Atherosclerotic heart disease of native coronary artery without angina pectoris: Secondary | ICD-10-CM | POA: Diagnosis present

## 2010-11-29 DIAGNOSIS — Z951 Presence of aortocoronary bypass graft: Secondary | ICD-10-CM

## 2010-11-29 DIAGNOSIS — Z9861 Coronary angioplasty status: Secondary | ICD-10-CM

## 2010-11-29 DIAGNOSIS — M069 Rheumatoid arthritis, unspecified: Secondary | ICD-10-CM | POA: Diagnosis present

## 2010-11-29 DIAGNOSIS — I4891 Unspecified atrial fibrillation: Principal | ICD-10-CM | POA: Diagnosis present

## 2010-11-29 DIAGNOSIS — I472 Ventricular tachycardia, unspecified: Secondary | ICD-10-CM | POA: Diagnosis present

## 2010-11-29 DIAGNOSIS — I359 Nonrheumatic aortic valve disorder, unspecified: Secondary | ICD-10-CM | POA: Diagnosis present

## 2010-11-29 DIAGNOSIS — I10 Essential (primary) hypertension: Secondary | ICD-10-CM | POA: Diagnosis present

## 2010-11-29 DIAGNOSIS — E7800 Pure hypercholesterolemia, unspecified: Secondary | ICD-10-CM | POA: Diagnosis present

## 2010-11-29 DIAGNOSIS — D649 Anemia, unspecified: Secondary | ICD-10-CM | POA: Diagnosis present

## 2010-11-29 DIAGNOSIS — Z79899 Other long term (current) drug therapy: Secondary | ICD-10-CM

## 2010-11-29 DIAGNOSIS — R0789 Other chest pain: Secondary | ICD-10-CM | POA: Diagnosis present

## 2010-11-29 DIAGNOSIS — I4729 Other ventricular tachycardia: Secondary | ICD-10-CM | POA: Diagnosis present

## 2010-11-29 DIAGNOSIS — Z7901 Long term (current) use of anticoagulants: Secondary | ICD-10-CM

## 2010-11-29 DIAGNOSIS — Z7982 Long term (current) use of aspirin: Secondary | ICD-10-CM

## 2010-11-29 LAB — CARDIAC PANEL(CRET KIN+CKTOT+MB+TROPI)
CK, MB: 2.7 ng/mL (ref 0.3–4.0)
CK, MB: 2.5 ng/mL (ref 0.3–4.0)
Total CK: 85 U/L (ref 7–177)
Total CK: 72 U/L (ref 7–177)
Troponin I: <0.3 ng/mL (ref <0.30)

## 2010-11-29 LAB — COMPREHENSIVE METABOLIC PANEL
ALT: 17 U/L (ref 0–35)
AST: 26 U/L (ref 0–37)
CO2: 28 mEq/L (ref 19–32)
Calcium: 9.3 mg/dL (ref 8.4–10.5)
Chloride: 102 mEq/L (ref 96–112)
Creatinine, Ser: 0.84 mg/dL (ref 0.50–1.10)
GFR calc Af Amer: >60 mL/min (ref >60)
GFR calc non Af Amer: >60 mL/min (ref >60)
Glucose, Bld: 125 mg/dL — ABNORMAL HIGH (ref 70–99)
Total Bilirubin: 0.5 mg/dL (ref 0.3–1.2)

## 2010-11-29 LAB — CBC
Hemoglobin: 11.7 g/dL — ABNORMAL LOW (ref 12.0–15.0)
MCH: 31.9 pg (ref 26.0–34.0)
MCV: 94.3 fL (ref 78.0–100.0)
Platelets: 200 10*3/uL (ref 150–400)
RBC: 3.67 MIL/uL — ABNORMAL LOW (ref 3.87–5.11)
WBC: 5.9 10*3/uL (ref 4.0–10.5)

## 2010-11-29 LAB — PROTIME-INR
INR: 1.05 (ref 0.00–1.49)
Prothrombin Time: 13.9 seconds (ref 11.6–15.2)

## 2010-11-30 ENCOUNTER — Inpatient Hospital Stay (HOSPITAL_COMMUNITY): Payer: Medicare Other

## 2010-11-30 LAB — BASIC METABOLIC PANEL
BUN: 16 mg/dL (ref 6–23)
CO2: 25 mEq/L (ref 19–32)
Chloride: 103 mEq/L (ref 96–112)
Creatinine, Ser: 0.94 mg/dL (ref 0.50–1.10)
Glucose, Bld: 113 mg/dL — ABNORMAL HIGH (ref 70–99)

## 2010-11-30 LAB — CARDIAC PANEL(CRET KIN+CKTOT+MB+TROPI)
CK, MB: 2.4 ng/mL (ref 0.3–4.0)
Total CK: 68 U/L (ref 7–177)

## 2010-11-30 LAB — CBC
HCT: 31.9 % — ABNORMAL LOW (ref 36.0–46.0)
MCV: 93.5 fL (ref 78.0–100.0)
RBC: 3.41 MIL/uL — ABNORMAL LOW (ref 3.87–5.11)
RDW: 15.5 % (ref 11.5–15.5)
WBC: 6.8 10*3/uL (ref 4.0–10.5)

## 2010-11-30 LAB — LIPID PANEL
LDL Cholesterol: 80 mg/dL (ref 0–99)
Total CHOL/HDL Ratio: 2.9 RATIO
VLDL: 14 mg/dL (ref 0–40)

## 2010-11-30 MED ORDER — TECHNETIUM TC 99M TETROFOSMIN IV KIT
30.0000 | PACK | Freq: Once | INTRAVENOUS | Status: AC | PRN
  Administered 2010-11-30: 30 via INTRAVENOUS

## 2010-11-30 MED ORDER — TECHNETIUM TC 99M TETROFOSMIN IV KIT
10.0000 | PACK | Freq: Once | INTRAVENOUS | Status: AC | PRN
  Administered 2010-11-30: 10 via INTRAVENOUS

## 2010-12-01 LAB — FOLATE: Folate: >20 ng/mL

## 2010-12-01 LAB — CBC
HCT: 31.1 % — ABNORMAL LOW (ref 36.0–46.0)
Hemoglobin: 10.5 g/dL — ABNORMAL LOW (ref 12.0–15.0)
WBC: 4.9 10*3/uL (ref 4.0–10.5)

## 2010-12-01 LAB — IRON AND TIBC
Iron: 34 ug/dL — ABNORMAL LOW (ref 42–135)
TIBC: 308 ug/dL (ref 250–470)

## 2010-12-01 LAB — VITAMIN B12: Vitamin B-12: 239 pg/mL (ref 211–911)

## 2010-12-01 LAB — HEPARIN LEVEL (UNFRACTIONATED): Heparin Unfractionated: 0.48 IU/mL (ref 0.30–0.70)

## 2010-12-01 LAB — OCCULT BLOOD X 1 CARD TO LAB, STOOL: Fecal Occult Bld: NEGATIVE

## 2010-12-02 LAB — BASIC METABOLIC PANEL
CO2: 24 mEq/L (ref 19–32)
Calcium: 9.1 mg/dL (ref 8.4–10.5)
Chloride: 106 mEq/L (ref 96–112)
Glucose, Bld: 103 mg/dL — ABNORMAL HIGH (ref 70–99)
Sodium: 139 mEq/L (ref 135–145)

## 2010-12-02 LAB — CBC
HCT: 31.1 % — ABNORMAL LOW (ref 36.0–46.0)
MCH: 31.3 pg (ref 26.0–34.0)
MCV: 93.7 fL (ref 78.0–100.0)
Platelets: 137 10*3/uL — ABNORMAL LOW (ref 150–400)
RBC: 3.32 MIL/uL — ABNORMAL LOW (ref 3.87–5.11)

## 2010-12-02 LAB — OCCULT BLOOD X 1 CARD TO LAB, STOOL: Fecal Occult Bld: NEGATIVE

## 2010-12-03 LAB — CBC
HCT: 31.1 % — ABNORMAL LOW (ref 36.0–46.0)
Hemoglobin: 10.4 g/dL — ABNORMAL LOW (ref 12.0–15.0)
MCV: 94.2 fL (ref 78.0–100.0)
RBC: 3.3 MIL/uL — ABNORMAL LOW (ref 3.87–5.11)
RDW: 16.3 % — ABNORMAL HIGH (ref 11.5–15.5)
WBC: 6.8 10*3/uL (ref 4.0–10.5)

## 2010-12-03 LAB — BASIC METABOLIC PANEL
Calcium: 8.9 mg/dL (ref 8.4–10.5)
GFR calc Af Amer: >60 mL/min (ref >60)
GFR calc non Af Amer: >60 mL/min (ref >60)
Potassium: 4.3 mEq/L (ref 3.5–5.1)
Sodium: 136 mEq/L (ref 135–145)

## 2010-12-03 LAB — PROTIME-INR: INR: 1.56 — ABNORMAL HIGH (ref 0.00–1.49)

## 2010-12-04 LAB — CBC
MCH: 31.7 pg (ref 26.0–34.0)
MCV: 94.5 fL (ref 78.0–100.0)
Platelets: 126 10*3/uL — ABNORMAL LOW (ref 150–400)
RBC: 3.09 MIL/uL — ABNORMAL LOW (ref 3.87–5.11)
RDW: 16.3 % — ABNORMAL HIGH (ref 11.5–15.5)
WBC: 5.4 10*3/uL (ref 4.0–10.5)

## 2010-12-04 LAB — HEPARIN LEVEL (UNFRACTIONATED): Heparin Unfractionated: 0.3 IU/mL (ref 0.30–0.70)

## 2010-12-05 LAB — HEPARIN LEVEL (UNFRACTIONATED): Heparin Unfractionated: 0.39 IU/mL (ref 0.30–0.70)

## 2010-12-06 LAB — CBC
HCT: 31.6 % — ABNORMAL LOW (ref 36.0–46.0)
MCH: 30.8 pg (ref 26.0–34.0)
MCV: 94.6 fL (ref 78.0–100.0)
Platelets: 145 10*3/uL — ABNORMAL LOW (ref 150–400)
RBC: 3.34 MIL/uL — ABNORMAL LOW (ref 3.87–5.11)
WBC: 5.8 10*3/uL (ref 4.0–10.5)

## 2010-12-06 LAB — PROTIME-INR: INR: 2.02 — ABNORMAL HIGH (ref 0.00–1.49)

## 2010-12-06 NOTE — Discharge Summary (Signed)
[name], [name]               ACCOUNT NO.:  [id number]  MEDICAL RECORD NO.:  [id number]  LOCATION:  3705                         FACILITY:  [facility]  PHYSICIAN:  Eduardo Osier. Sharyn Lull, M.D. DATE OF BIRTH:  10-14-1940  DATE OF ADMISSION:  11/29/2010 DATE OF DISCHARGE:  12/06/2010                              DISCHARGE SUMMARY   ADMITTING DIAGNOSES: 1. Chest pain rule out myocardial infarction 2. New onset atrial fibrillation with controlled ventricular response. 3. Coronary artery disease, history of coronary artery bypass graft     and aortic valve replacement in the past. 4. History of aortic stenosis. 5. Hypertension. 6. Hypercholesteremia. 7. Morbid obesity. 8. Rheumatoid arthritis. 9. 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 10.Anemia. 11.History of atrial fibrillation in the past.  DISCHARGE DIAGNOSES: 1. Status post chest pain, myocardial infarction ruled out, negative     Persantine Myoview. 2. New onset atrial fibrillation with controlled ventricular response,     status post nonsustained ventricular tachycardia. 3. Coronary artery disease, stable, status post coronary artery bypass     graft and aortic valve replacement in the past. 4. History of aortic stenosis. 5. Hypertension. 6. Hypercholesteremia. 7. Morbid obesity. 8. Rheumatoid arthritis. 9. Anemia. 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 11.History of atrial fibrillation in the past.  DISCHARGE HOME MEDICATIONS: 1. Amiodarone 200 mg 1 tablet daily. 2. Metoprolol 50 mg 1 tablet twice daily. 3. Crestor 10 mg 1 tablet daily. 4. Warfarin 5 mg 1 tablet daily. 5. Enteric-coated aspirin 81 mg 1 tablet daily. 6. Benicar HCT 40/12.5 mg 1 tablet daily. 7. Benlysta one injection IV every month as before. 8. Lasix 40 mg 1 tablet daily. 9. Hydroxychloroquine 200 mg 1 tablet twice daily as before. 10.Methotrexate 2.5 mg 5 tablets once every week on Saturday. 11.Pantoprazole 40 mg 1 tablet daily as  before. 12.Potassium chloride 20 mEq 1 tablet daily. 13.Prednisone 5 mg 1 tablet daily. 14.ReQuip 1 mg p.o. daily at night. 15.Sulfasalazine 500 mg 2 tablets twice daily as before.  DIET:  Low salt, low cholesterol.  ACTIVITY:  Increase activity slowly as tolerated.  FOLLOWUP:  With me in 1 week.  We will check her CBC, pro time, BNP in 1 week as outpatient.  CONDITION AT DISCHARGE:  Stable.  BRIEF HISTORY AND HOSPITAL COURSE:  [name] is 70 year old white female with past medical history significant for coronary artery disease, history of multiple PCIs to LAD and obtuse marginal two in the past, history of severe aortic stenosis status post bioprosthetic aortic valve and LIMA to LAD in January 2006, hypertension, hypercholesteremia, history of pleuropericarditis in the past, history of rheumatoid arthritis, morbid obesity, anemia.  She came to the office complaining of retrosternal chest pain associated with minimal exertion associated with shortness of breath and feeling tired, fatigued, weak lately.  EKG done in the office showed new onset AFib with controlled ventricular response with ST-T wave changes in the lateral leads.  Denies any palpitation, lightheadedness or syncope.  Denies any weakness in the arms or legs.  Denies PND, orthopnea or leg swelling.  Denies cough, fever or chills.  PAST MEDICAL HISTORY:  As above plus history of paroxysmal AFib in past.  PAST SURGICAL  HISTORY:  She had AVR and CABG in January 2006, had breast lumpectomy in the past.  ALLERGIES:  She is allergic to CODEINE.  MEDICATION AT HOME:  She was on 1. Procardia XL 90 mg p.o. daily. 2. Betapace AF 120 mg p.o. b.i.d. 3. Benicar HCT 40/12.5 mg p.o. daily. 4. Zocor 40 mg p.o. daily. 5. Lasix 40 mg p.o. daily. 6. Klor-Con 20 mEq p.o. daily. 7. Enteric-coated aspirin 81 mg p.o. daily. 8. Plaquenil 200 mg p.o. b.i.d. 9. Prednisone 5 mg p.o. daily. 10.Methotrexate 2.5 mg 5 tablet every  Saturday. 11.Sulfasalazine 500 mg 2 tablets twice daily. 12.ReQuip 1 mg p.o. at bedtime.  SOCIAL HISTORY:  She is divorced, retired, worked for [employer] in past. No history of smoking or alcohol abuse.  FAMILY HISTORY:  Noncontributory.  PHYSICAL EXAMINATION:  GENERAL:  She is alert, awake and oriented x3, in no acute distress. VITAL SIGNS:  Blood pressure was 140/90, pulse was 76 irregularly irregular. HEENT:  Conjunctiva was pink. NECK:  Supple, no JVD, no bruit. LUNGS:  Clear to auscultation without rhonchi or rales. CARDIOVASCULAR:  Irregularly irregular.  S1 and S2 was normal.  There was soft systolic murmur.  There was no rub. ABDOMEN:  Soft.  Bowel sounds were present, nontender. EXTREMITIES:  There is no clubbing, cyanosis or edema.  LABORATORY DATA:  Hemoglobin was 11.7, hematocrit 34.6, white count of 5.9.  Sodium was 139, potassium 3.6, BUN 12, creatinine 0.84, glucose was 125, magnesium was 2.2.  Cardiac enzymes two sets were negative.  PT was 13.9, INR of 1.05.  TSH was 3.04.  Cholesterol was 143, LDL 80, HDL 49, triglycerides were 72.  Stool for occult blood was negative.  PT on September 8 was 15.5, INR of 1.20.  Today, her PT is 23.2, INR of 2.02.  BRIEF HOSPITAL COURSE:  The patient was admitted to telemetry unit.  MI was ruled out by serial enzymes and EKG.  The patient subsequently underwent Lexiscan Myoview which showed no evidence of reversible ischemia with EF of 53%.  The patient was started on IV heparin and p.o. Coumadin.  Her INR is in therapeutic range.  Discussed with the patient regarding TEE cardioversion versus rate-controlled and chemical cardioversion in future.  The patient opted for medical management and rate control at present.  The patient did had episode of nonsustained VT, was started on p.o. amiodarone low dose.  The patient did not had any further episodes of ventricular tachycardia but had occasional episodes of pauses up to 2.7  seconds but the patient remained asymptomatic.  Her amiodarone dose has been reduced to 200 mg p.o. daily and her Lopressor dose has been also reduced from 50 mg t.i.d. to b.i.d. The patient will be discharged home on above medications and will be followed up in my office in 1 week.     Eduardo Osier. Sharyn Lull, M.D.     [facility]  D:  12/06/2010  T:  12/06/2010  Job:  [id number]  Electronically Signed by Rinaldo Cloud M.D. on 12/06/2010 09:44:04 PM

## 2010-12-20 ENCOUNTER — Encounter (HOSPITAL_COMMUNITY): Payer: Medicare Other | Attending: Rheumatology

## 2010-12-20 DIAGNOSIS — M329 Systemic lupus erythematosus, unspecified: Secondary | ICD-10-CM | POA: Insufficient documentation

## 2011-01-17 ENCOUNTER — Encounter (HOSPITAL_COMMUNITY)
Admission: RE | Admit: 2011-01-17 | Discharge: 2011-01-17 | Disposition: A | Discharge status: Home / Self Care | Payer: Medicare Other | Source: Ambulatory Visit | Attending: Rheumatology | Admitting: Rheumatology

## 2011-01-17 DIAGNOSIS — M329 Systemic lupus erythematosus, unspecified: Secondary | ICD-10-CM | POA: Insufficient documentation

## 2011-02-05 ENCOUNTER — Ambulatory Visit: Payer: Medicare Other | Admitting: Adult Health

## 2011-02-06 ENCOUNTER — Encounter: Payer: Self-pay | Admitting: Adult Health

## 2011-02-06 ENCOUNTER — Ambulatory Visit (INDEPENDENT_AMBULATORY_CARE_PROVIDER_SITE_OTHER): Payer: Medicare Other | Admitting: Adult Health

## 2011-02-06 DIAGNOSIS — J209 Acute bronchitis, unspecified: Secondary | ICD-10-CM

## 2011-02-06 MED ORDER — AMOXICILLIN-POT CLAVULANATE 875-125 MG PO TABS: 1.0000 | ORAL_TABLET | Freq: Two times a day (BID) | ORAL | Status: AC

## 2011-02-06 NOTE — Patient Instructions (Signed)
Augmentin 875mg  Twice daily  For 7 days  Mucinex DM Twice daily  As needed  Cough/congestion  Fluids and rest  Increase Prednisone 10mg  daily for 1 week then 5mg  daily  Please contact office for sooner follow up if symptoms do not improve or worsen or seek emergency care   follow up Dr. Delford Field  In 6 weeks and As needed

## 2011-02-06 NOTE — Progress Notes (Signed)
Subjective:    Patient ID: [name], female    DOB: 1941/01/31, 70 y.o.   MRN: [id number]  HPI  20 -year-old white female history of mixed connected tissue disease, pulmonary fibrosis, pneumonitis,   December 13, 2009 3:23 PM  Dyspnea is the same. No cough until the last few days. No mucus. No chest pain. No f/c/s.  No stamina as before .    May 03, 2010 9:32 AM  No real changes. No real change in breathing, no cough    CXR:  07/29/2009:  CXR Results: IMPRESSION:  1. Stable cardiomegaly.  2. Very faint prominence of the interstitium.  07/03/2007:  CXR Results: IMPRESSION:  1. Mild cardiac enlargement, stable.  2. Chronic lung changes but no acute pulmonary findings.  PFT's:  12/13/2009:  DLCO %Predicted: 47  FEF 25/75 %Predicted: 63  FEV1 %Predicted: 89  FVC %Predicted: 85  Post Spirometry FEF 25/75 %Predicted: 84  Post Spirometry FEV1 %Predicted: 95  Post Spirometry FVC %Predicted: 86  07/16/2007:  DLCO %Predicted: 46  FEF 25/75 %Predicted: 73  FEV1 %Predicted: 89  FVC %Predicted: 83  Post Spirometry FEF 25/75 %Predicted: 90  Post Spirometry FEV1 %Predicted: 93  Post Spirometry FVC %Predicted: 84  RV %Predicted: 86  TLC %Predicted: 84   7/11//2012 Still wheezing and tightness.   Dry cough .  No chest pain .  If takes a deep breath will hurt.    02/06/11 Acute OV  Complains of prod cough with green mucus, increased SOB x2weeks,  OTC not working .  Cough is getting worse.  Currently on prednisone 5mg  daily  No hemopytsis , chest pain, edema or recent travel/abx. Used   Past Medical History  Diagnosis Date  . Coronary artery disease   . Atrial fibrillation     Now NSR  . GERD (gastroesophageal reflux disease)   . Hyperlipidemia   . 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   History   Social History  . Marital Status: Divorced    Spouse Name: N/A    Number of Children: N/A  . Years of  Education: N/A   Occupational History  . Not on file.   Social History Main Topics  . Smoking status: Never Smoker   . Smokeless tobacco: Never Used  . Alcohol Use: Not on file  . Drug Use: Not on file  . Sexually Active: Not on file   Other Topics Concern  . Not on file   Social History Narrative  . No narrative on file     Allergies  Allergen Reactions  . Codeine     REACTION: nausea  . Codeine Phosphate     REACTION: unspecified     Outpatient Prescriptions Prior to Visit  Medication Sig Dispense Refill  . aspirin 81 MG tablet Take 81 mg by mouth daily.        . Belimumab (BENLYSTA IV) Inject into the vein. Once monthly       . BENICAR HCT 40-12.5 MG per tablet Once daily      . furosemide (LASIX) 40 MG tablet Once daily      . hydroxychloroquine (PLAQUENIL) 200 MG tablet Twice daily      . KLOR-CON M20 20 MEQ tablet Once daily      . methotrexate (RHEUMATREX) 2.5 MG tablet 5 by mouth weekly      . NIFEdipine (PROCARDIA XL/ADALAT-CC) 90 MG 24 hr tablet  Once daily      . pantoprazole (PROTONIX) 40 MG tablet TAKE 1 TABLET BY MOUTH ONCE DAILY  90 tablet  1  . predniSONE (DELTASONE) 5 MG tablet TAKE 1 TABLET BY MOUTH EVERY DAY  60 tablet  3  . rOPINIRole (REQUIP) 1 MG tablet Daily at bedtime      . sulfaSALAzine (AZULFIDINE) 500 MG tablet 2 tablets twice daily      . sotalol (BETAPACE) 120 MG tablet Two times daily      . simvastatin (ZOCOR) 40 MG tablet daily          Review of Systems  Constitutional:   No  weight loss, night sweats,  Fevers, chills, fatigue, lassitude. HEENT:   No headaches,  Difficulty swallowing,  Tooth/dental problems,  Sore throat,                No sneezing, itching, ear ache, nasal congestion, + post nasal drip,   CV:  No chest pain,  Orthopnea, PND, swelling in lower extremities, anasarca, dizziness, palpitations  GI  No heartburn, indigestion, abdominal pain, nausea, vomiting, diarrhea, change in bowel habits, loss of  appetite  Resp: Notes  shortness of breath with exertion not at rest.  No coughing up of blood.  No chest wall deformity  Skin: no rash or lesions.  GU: no dysuria, change in color of urine, no urgency or frequency.  No flank pain.  MS:  No joint pain or swelling.  No decreased range of motion.  No back pain.  Psych:  No change in mood or affect. No depression or anxiety.  No memory loss.     Objective:   Physical Exam  Filed Vitals:   02/06/11 1211  BP: 134/82  Pulse: 94  Temp: 97.4 F (36.3 C)  TempSrc: Oral  Height: 5\' 7"  (1.702 m)  Weight: 231 lb 6.4 oz (104.962 kg)  SpO2: 95%    Gen: Pleasant, well-nourished, in no distress,  normal affect  ENT: No lesions,  mouth clear,  oropharynx clear, no postnasal drip  Neck: No JVD, no TMG, no carotid bruits  Lungs: No use of accessory muscles, no dullness to percussion,dry rales Coarse BS bilaterally   Cardiovascular: RRR, heart sounds normal, no murmur or gallops, no peripheral edema  Abdomen: soft and NT, no HSM,  BS normal  Musculoskeletal: No deformities, no cyanosis or clubbing  Neuro: alert, non focal  Skin: Warm, no lesions or rashes        Assessment & Plan:      Updated Medication List Outpatient Encounter Prescriptions as of 02/06/2011  Medication Sig Dispense Refill  . amiodarone (PACERONE) 200 MG tablet Take 200 mg by mouth daily.        Marland Kitchen aspirin 81 MG tablet Take 81 mg by mouth daily.        . Belimumab (BENLYSTA IV) Inject into the vein. Once monthly       . BENICAR HCT 40-12.5 MG per tablet Once daily      . furosemide (LASIX) 40 MG tablet Once daily      . hydroxychloroquine (PLAQUENIL) 200 MG tablet Twice daily      . KLOR-CON M20 20 MEQ tablet Once daily      . methotrexate (RHEUMATREX) 2.5 MG tablet 5 by mouth weekly      . metoprolol (LOPRESSOR) 50 MG tablet Take 50 mg by mouth 2 (two) times daily.        Marland Kitchen NIFEdipine (PROCARDIA XL/ADALAT-CC) 90 MG 24  hr tablet Once daily      .  pantoprazole (PROTONIX) 40 MG tablet TAKE 1 TABLET BY MOUTH ONCE DAILY  90 tablet  1  . predniSONE (DELTASONE) 5 MG tablet TAKE 1 TABLET BY MOUTH EVERY DAY  60 tablet  3  . rOPINIRole (REQUIP) 1 MG tablet Daily at bedtime      . rosuvastatin (CRESTOR) 10 MG tablet Take 10 mg by mouth daily.        Marland Kitchen sulfaSALAzine (AZULFIDINE) 500 MG tablet 2 tablets twice daily      . warfarin (COUMADIN) 5 MG tablet Take 5 mg by mouth daily.        . sotalol (BETAPACE) 120 MG tablet Two times daily      . DISCONTD: simvastatin (ZOCOR) 40 MG tablet daily

## 2011-02-08 ENCOUNTER — Telehealth: Payer: Self-pay | Admitting: Critical Care Medicine

## 2011-02-08 DIAGNOSIS — J209 Acute bronchitis, unspecified: Secondary | ICD-10-CM | POA: Insufficient documentation

## 2011-02-08 NOTE — Telephone Encounter (Signed)
Called and spoke with pt. Pt was seen by TP on 02/06/11 and started on Augmentin.  Pt states she has been having diarrhea that started on Saturday (prior to starting on abx)  Pt is taking abx with food and large glass of water and diarrhea continues.  Pt states every time she sneezes or coughs she "messes her self."  Pt is wanting to know if the abx is causing this or, if because it started prior to taking the abx, that another med she has on her med list that she is taking could be causing the diarrhea.. Please advise.  Thanks.   Allergies  Allergen Reactions  . Codeine     REACTION: nausea  . Codeine Phosphate     REACTION: unspecified

## 2011-02-08 NOTE — Telephone Encounter (Signed)
ABX can cause diarrhea or aggravate this Would try to begin probiotic -align daily  Eat yogurt Twice daily  -activia  Push Fluids  If this is not helping will need to call back and we will need to change abx.  Please contact office for sooner follow up if symptoms do not improve or worsen or seek emergency care

## 2011-02-08 NOTE — Telephone Encounter (Signed)
I spoke with pt and is aware of TP recs. She verbalized understanding and will try what she recommends to see if that works.

## 2011-02-08 NOTE — Assessment & Plan Note (Signed)
Flare  Plan ;  Augmentin 875mg  Twice daily  For 7 days  Mucinex DM Twice daily  As needed  Cough/congestion  Fluids and rest  Increase Prednisone 10mg  daily for 1 week then 5mg  daily  Please contact office for sooner follow up if symptoms do not improve or worsen or seek emergency care   follow up Dr. Delford Field  In 6 weeks and As needed

## 2011-02-12 ENCOUNTER — Other Ambulatory Visit (HOSPITAL_COMMUNITY): Payer: Self-pay | Admitting: *Deleted

## 2011-02-14 ENCOUNTER — Encounter (HOSPITAL_COMMUNITY)
Admission: RE | Admit: 2011-02-14 | Discharge: 2011-02-14 | Disposition: A | Discharge status: Home / Self Care | Payer: Medicare Other | Source: Ambulatory Visit | Attending: Rheumatology | Admitting: Rheumatology

## 2011-02-14 DIAGNOSIS — M329 Systemic lupus erythematosus, unspecified: Secondary | ICD-10-CM | POA: Insufficient documentation

## 2011-02-14 MED ORDER — SODIUM CHLORIDE 0.9 % IV SOLN
1000.0000 mg | INTRAVENOUS | Status: DC
  Administered 2011-02-14: 1000 mg via INTRAVENOUS
  Filled 2011-02-14: qty 1000

## 2011-02-14 MED ORDER — SODIUM CHLORIDE 0.9 % IV SOLN
1000.0000 mg | INTRAVENOUS | Status: DC
  Filled 2011-02-14: qty 1000

## 2011-02-14 MED ORDER — ACETAMINOPHEN 325 MG PO TABS
650.0000 mg | ORAL_TABLET | ORAL | Status: DC
  Administered 2011-02-14: 650 mg via ORAL
  Filled 2011-02-14: qty 2

## 2011-03-11 ENCOUNTER — Other Ambulatory Visit: Payer: Self-pay | Admitting: Cardiology

## 2011-03-14 ENCOUNTER — Encounter (HOSPITAL_COMMUNITY): Payer: Medicare Other

## 2011-03-14 ENCOUNTER — Encounter (HOSPITAL_COMMUNITY)
Admission: RE | Admit: 2011-03-14 | Discharge: 2011-03-14 | Disposition: A | Discharge status: Home / Self Care | Payer: Medicare Other | Source: Ambulatory Visit | Attending: Rheumatology | Admitting: Rheumatology

## 2011-03-14 DIAGNOSIS — M329 Systemic lupus erythematosus, unspecified: Secondary | ICD-10-CM | POA: Insufficient documentation

## 2011-03-14 MED ORDER — BELIMUMAB 120 MG IV SOLR
1000.0000 mg | INTRAVENOUS | Status: DC
  Administered 2011-03-14: 1000 mg via INTRAVENOUS
  Filled 2011-03-14: qty 12.5

## 2011-03-14 MED ORDER — ACETAMINOPHEN 325 MG PO TABS
650.0000 mg | ORAL_TABLET | ORAL | Status: DC
  Administered 2011-03-14: 650 mg via ORAL
  Filled 2011-03-14: qty 2

## 2011-04-03 ENCOUNTER — Ambulatory Visit: Payer: Medicare Other | Admitting: Critical Care Medicine

## 2011-04-05 ENCOUNTER — Inpatient Hospital Stay (HOSPITAL_COMMUNITY)
Admission: EM | Admit: 2011-04-05 | Discharge: 2011-04-15 | DRG: 812 | Disposition: A | Discharge status: Home / Self Care | Payer: Medicare Other | Attending: Cardiology | Admitting: Cardiology

## 2011-04-05 DIAGNOSIS — I482 Chronic atrial fibrillation, unspecified: Secondary | ICD-10-CM

## 2011-04-05 DIAGNOSIS — IMO0002 Reserved for concepts with insufficient information to code with codable children: Secondary | ICD-10-CM

## 2011-04-05 DIAGNOSIS — I10 Essential (primary) hypertension: Secondary | ICD-10-CM | POA: Diagnosis present

## 2011-04-05 DIAGNOSIS — K219 Gastro-esophageal reflux disease without esophagitis: Secondary | ICD-10-CM | POA: Diagnosis present

## 2011-04-05 DIAGNOSIS — D131 Benign neoplasm of stomach: Secondary | ICD-10-CM

## 2011-04-05 DIAGNOSIS — D649 Anemia, unspecified: Principal | ICD-10-CM | POA: Diagnosis present

## 2011-04-05 DIAGNOSIS — E7800 Pure hypercholesterolemia, unspecified: Secondary | ICD-10-CM | POA: Diagnosis present

## 2011-04-05 DIAGNOSIS — R195 Other fecal abnormalities: Secondary | ICD-10-CM

## 2011-04-05 DIAGNOSIS — M069 Rheumatoid arthritis, unspecified: Secondary | ICD-10-CM | POA: Diagnosis present

## 2011-04-05 DIAGNOSIS — T45515A Adverse effect of anticoagulants, initial encounter: Secondary | ICD-10-CM | POA: Diagnosis present

## 2011-04-05 DIAGNOSIS — D62 Acute posthemorrhagic anemia: Secondary | ICD-10-CM

## 2011-04-05 DIAGNOSIS — E785 Hyperlipidemia, unspecified: Secondary | ICD-10-CM | POA: Diagnosis present

## 2011-04-05 DIAGNOSIS — K922 Gastrointestinal hemorrhage, unspecified: Secondary | ICD-10-CM | POA: Diagnosis present

## 2011-04-05 DIAGNOSIS — Z952 Presence of prosthetic heart valve: Secondary | ICD-10-CM

## 2011-04-05 DIAGNOSIS — I4891 Unspecified atrial fibrillation: Secondary | ICD-10-CM | POA: Diagnosis present

## 2011-04-05 DIAGNOSIS — I2510 Atherosclerotic heart disease of native coronary artery without angina pectoris: Secondary | ICD-10-CM | POA: Diagnosis present

## 2011-04-05 HISTORY — DX: Shortness of breath: R06.02

## 2011-04-05 HISTORY — DX: Reserved for concepts with insufficient information to code with codable children: IMO0002

## 2011-04-05 HISTORY — DX: Systemic lupus erythematosus, unspecified: M32.9

## 2011-04-05 HISTORY — DX: Angina pectoris, unspecified: I20.9

## 2011-04-05 HISTORY — DX: Pneumonia, unspecified organism: J18.9

## 2011-04-05 HISTORY — DX: Anemia, unspecified: D64.9

## 2011-04-05 HISTORY — DX: Reserved for inherently not codable concepts without codable children: IMO0001

## 2011-04-05 HISTORY — DX: Personal history of other diseases of the digestive system: Z87.19

## 2011-04-05 HISTORY — DX: Encounter for other specified aftercare: Z51.89

## 2011-04-05 HISTORY — DX: Essential (primary) hypertension: I10

## 2011-04-06 ENCOUNTER — Encounter (HOSPITAL_COMMUNITY): Payer: Self-pay | Admitting: *Deleted

## 2011-04-06 DIAGNOSIS — D62 Acute posthemorrhagic anemia: Secondary | ICD-10-CM

## 2011-04-06 LAB — COMPREHENSIVE METABOLIC PANEL
ALT: 8 U/L (ref 0–35)
ALT: 8 U/L (ref 0–35)
AST: 15 U/L (ref 0–37)
Albumin: 3 g/dL — ABNORMAL LOW (ref 3.5–5.2)
Alkaline Phosphatase: 61 U/L (ref 39–117)
CO2: 26 mEq/L (ref 19–32)
Calcium: 8.9 mg/dL (ref 8.4–10.5)
Creatinine, Ser: 1.48 mg/dL — ABNORMAL HIGH (ref 0.50–1.10)
GFR calc Af Amer: 49 mL/min — ABNORMAL LOW (ref >90)
GFR calc non Af Amer: 34 mL/min — ABNORMAL LOW (ref >90)
GFR calc non Af Amer: 42 mL/min — ABNORMAL LOW (ref >90)
Glucose, Bld: 98 mg/dL (ref 70–99)
Potassium: 3.5 mEq/L (ref 3.5–5.1)
Sodium: 139 mEq/L (ref 135–145)
Sodium: 140 mEq/L (ref 135–145)
Total Protein: 6.4 g/dL (ref 6.0–8.3)

## 2011-04-06 LAB — TYPE AND SCREEN
ABO/RH(D): O POS
Antibody Screen: NEGATIVE
Unit division: 0
Unit division: 0

## 2011-04-06 LAB — MRSA PCR SCREENING: MRSA by PCR: NEGATIVE

## 2011-04-06 LAB — PREPARE RBC (CROSSMATCH)

## 2011-04-06 LAB — APTT: aPTT: 61 seconds — ABNORMAL HIGH (ref 24–37)

## 2011-04-06 LAB — CBC
HCT: 19.1 % — ABNORMAL LOW (ref 36.0–46.0)
HCT: 28.1 % — ABNORMAL LOW (ref 36.0–46.0)
Hemoglobin: 6 g/dL — CL (ref 12.0–15.0)
MCHC: 32.4 g/dL (ref 30.0–36.0)
MCV: 84.4 fL (ref 78.0–100.0)
MCV: 90.1 fL (ref 78.0–100.0)
Platelets: 122 10*3/uL — ABNORMAL LOW (ref 150–400)
RBC: 2.12 MIL/uL — ABNORMAL LOW (ref 3.87–5.11)
RBC: 3.33 MIL/uL — ABNORMAL LOW (ref 3.87–5.11)
RDW: 18.1 % — ABNORMAL HIGH (ref 11.5–15.5)
RDW: 19 % — ABNORMAL HIGH (ref 11.5–15.5)
WBC: 3.7 10*3/uL — ABNORMAL LOW (ref 4.0–10.5)
WBC: 4.4 10*3/uL (ref 4.0–10.5)

## 2011-04-06 LAB — MAGNESIUM: Magnesium: 2.2 mg/dL (ref 1.5–2.5)

## 2011-04-06 LAB — PREPARE FRESH FROZEN PLASMA: Unit division: 0

## 2011-04-06 LAB — DIFFERENTIAL
Basophils Absolute: 0 10*3/uL (ref 0.0–0.1)
Eosinophils Relative: 2 % (ref 0–5)
Lymphocytes Relative: 29 % (ref 12–46)
Lymphs Abs: 1.3 10*3/uL (ref 0.7–4.0)
Monocytes Absolute: 0.6 10*3/uL (ref 0.1–1.0)
Monocytes Relative: 13 % — ABNORMAL HIGH (ref 3–12)
Neutro Abs: 2.5 10*3/uL (ref 1.7–7.7)

## 2011-04-06 LAB — PROTIME-INR: Prothrombin Time: 41.2 seconds — ABNORMAL HIGH (ref 11.6–15.2)

## 2011-04-06 LAB — SAMPLE TO BLOOD BANK

## 2011-04-06 MED ORDER — PREDNISONE 5 MG PO TABS
5.0000 mg | ORAL_TABLET | Freq: Every day | ORAL | Status: DC
  Administered 2011-04-07 – 2011-04-15 (×9): 5 mg via ORAL
  Filled 2011-04-06 (×11): qty 1

## 2011-04-06 MED ORDER — PHYTONADIONE 5 MG PO TABS
5.0000 mg | ORAL_TABLET | ORAL | Status: AC
  Administered 2011-04-06: 5 mg via ORAL
  Filled 2011-04-06: qty 1

## 2011-04-06 MED ORDER — HYDROXYCHLOROQUINE SULFATE 200 MG PO TABS
200.0000 mg | ORAL_TABLET | Freq: Every day | ORAL | Status: DC
  Administered 2011-04-06 – 2011-04-15 (×10): 200 mg via ORAL
  Filled 2011-04-06 (×10): qty 1

## 2011-04-06 MED ORDER — ROPINIROLE HCL 0.5 MG PO TABS
0.5000 mg | ORAL_TABLET | Freq: Two times a day (BID) | ORAL | Status: DC
  Administered 2011-04-06 – 2011-04-15 (×13): 0.5 mg via ORAL
  Filled 2011-04-06 (×20): qty 1

## 2011-04-06 MED ORDER — METOPROLOL TARTRATE 50 MG PO TABS
50.0000 mg | ORAL_TABLET | Freq: Two times a day (BID) | ORAL | Status: DC
  Administered 2011-04-06 – 2011-04-15 (×19): 50 mg via ORAL
  Filled 2011-04-06 (×20): qty 1

## 2011-04-06 MED ORDER — POTASSIUM CHLORIDE CRYS ER 20 MEQ PO TBCR
20.0000 meq | EXTENDED_RELEASE_TABLET | Freq: Every day | ORAL | Status: DC
  Administered 2011-04-06: 20 meq via ORAL
  Filled 2011-04-06 (×2): qty 1

## 2011-04-06 MED ORDER — FUROSEMIDE 40 MG PO TABS
40.0000 mg | ORAL_TABLET | Freq: Every day | ORAL | Status: DC
  Administered 2011-04-06 – 2011-04-15 (×10): 40 mg via ORAL
  Filled 2011-04-06 (×10): qty 1

## 2011-04-06 MED ORDER — ROSUVASTATIN CALCIUM 10 MG PO TABS
10.0000 mg | ORAL_TABLET | Freq: Every day | ORAL | Status: DC
  Administered 2011-04-06 – 2011-04-15 (×10): 10 mg via ORAL
  Filled 2011-04-06 (×10): qty 1

## 2011-04-06 MED ORDER — PANTOPRAZOLE SODIUM 40 MG PO TBEC
40.0000 mg | DELAYED_RELEASE_TABLET | Freq: Two times a day (BID) | ORAL | Status: DC
  Administered 2011-04-06 – 2011-04-07 (×2): 40 mg via ORAL
  Filled 2011-04-06 (×3): qty 1

## 2011-04-06 NOTE — ED Notes (Signed)
The pt has a low hgb.  She saw her doctor yesterday and today and blood was drawn and she was called tonight and told to come to the ed.  She is on coumadin.  Dark stools for a few days..  She has stopped taking coumadin for a few days

## 2011-04-06 NOTE — ED Notes (Signed)
Pt transported to floor via stretcher, monitor and nurse

## 2011-04-06 NOTE — H&P (Signed)
[name] is an 71 y.o. female.   Chief Complaint: Generalized weakness fatigue HPI: Patient is 71 year old female with past medical history significant for multiple medical problems i.e. coronary artery disease history of aortic stenosis status post CABG and a bioprosthetic aortic valve replacement in the past chronic atrial fibrillation hypertension hypercholesteremia morbid obesity history of rheumatoid arthritis anemia of chronic disease history of pleural pericarditis in the past was seen in my office yesterday because of prior to fatigue feeling associated with generalized weakness patient was noted to have hemoglobin of 5. 9 with elevated INR above 8 as per outpatient lab and patient was advised to come to ED for further evaluation and treatment patient denies any chest pain nausea vomiting diaphoresis denies any shortness of breath patient denies any palpitation lightheadedness or syncope patient denies any abdominal pain patient denies any black tarry stools or hematuria or hematemesis denies any bright red blood per rectum a repeat hemoglobin done in the ED also was noted to be 6.0 patient denies any NSAID abuse  Past Medical History  Diagnosis Date  . Coronary artery disease   . Atrial fibrillation     Now NSR  . GERD (gastroesophageal reflux disease)   . Hyperlipidemia   . 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 Anemia     Past Surgical History  Procedure Date  . Cardiac catheterization   . Cardiac valve replacement     2006    History reviewed. No pertinent family history. Social History:  reports that she has never smoked. She has never used smokeless tobacco. She reports that she does not drink alcohol. Her drug history not on file.  Allergies:  Allergies  Allergen Reactions  . Codeine     REACTION: nausea  . Codeine Phosphate     REACTION: unspecified    Medications Prior to Admission  Medication Dose Route Frequency Provider Last Rate  Last Dose  . phytonadione (VITAMIN K) tablet 5 mg  5 mg Oral To Major Nelia Shi, MD   5 mg at 04/06/11 0341   Medications Prior to Admission  Medication Sig Dispense Refill  . amiodarone (PACERONE) 200 MG tablet Take 200 mg by mouth daily.        Marland Kitchen aspirin 81 MG tablet Take 81 mg by mouth daily.        . Belimumab (BENLYSTA IV) Inject into the vein. Once monthly       . BENICAR HCT 40-12.5 MG per tablet Once daily      . ferrous sulfate 325 (65 FE) MG tablet Take 325 mg by mouth daily with breakfast.      . furosemide (LASIX) 40 MG tablet Once daily      . hydroxychloroquine (PLAQUENIL) 200 MG tablet Twice daily      . KLOR-CON M20 20 MEQ tablet Once daily      . methotrexate (RHEUMATREX) 2.5 MG tablet Take 12.5 mg by mouth once a week. On Saturdays      . metoprolol (LOPRESSOR) 50 MG tablet Take 50 mg by mouth 2 (two) times daily.        . pantoprazole (PROTONIX) 40 MG tablet TAKE 1 TABLET BY MOUTH ONCE DAILY  90 tablet  1  . predniSONE (DELTASONE) 5 MG tablet TAKE 1 TABLET BY MOUTH EVERY DAY  60 tablet  3  . rOPINIRole (REQUIP) 1 MG tablet Daily at bedtime      . rosuvastatin (CRESTOR) 10 MG  tablet Take 10 mg by mouth daily.        Marland Kitchen sulfaSALAzine (AZULFIDINE) 500 MG tablet Take 1,000 mg by mouth 2 (two) times daily. 2 tablets twice daily      . warfarin (COUMADIN) 5 MG tablet Take 5 mg by mouth daily.          Results for orders placed during the hospital encounter of 04/05/11 (from the past 48 hour(s))  CBC     Status: Abnormal   Collection Time   04/06/11 12:10 AM      Component Value Range Comment   WBC 4.4  4.0 - 10.5 (K/uL)    RBC 2.12 (*) 3.87 - 5.11 (MIL/uL)    Hemoglobin 6.0 (*) 12.0 - 15.0 (g/dL)    HCT 86.5 (*) 78.4 - 46.0 (%)    MCV 90.1  78.0 - 100.0 (fL)    MCH 28.3  26.0 - 34.0 (pg)    MCHC 31.4  30.0 - 36.0 (g/dL)    RDW 69.6 (*) 29.5 - 15.5 (%)    Platelets 195  150 - 400 (K/uL)   DIFFERENTIAL     Status: Abnormal   Collection Time   04/06/11 12:10 AM       Component Value Range Comment   Neutrophils Relative 56  43 - 77 (%)    Neutro Abs 2.5  1.7 - 7.7 (K/uL)    Lymphocytes Relative 29  12 - 46 (%)    Lymphs Abs 1.3  0.7 - 4.0 (K/uL)    Monocytes Relative 13 (*) 3 - 12 (%)    Monocytes Absolute 0.6  0.1 - 1.0 (K/uL)    Eosinophils Relative 2  0 - 5 (%)    Eosinophils Absolute 0.1  0.0 - 0.7 (K/uL)    Basophils Relative 1  0 - 1 (%)    Basophils Absolute 0.0  0.0 - 0.1 (K/uL)   PROTIME-INR     Status: Abnormal   Collection Time   04/06/11 12:10 AM      Component Value Range Comment   Prothrombin Time 63.0 (*) 11.6 - 15.2 (seconds) REPEATED TO VERIFY   INR 7.25 (*) 0.00 - 1.49    APTT     Status: Abnormal   Collection Time   04/06/11 12:10 AM      Component Value Range Comment   aPTT 61 (*) 24 - 37 (seconds)   COMPREHENSIVE METABOLIC PANEL     Status: Abnormal   Collection Time   04/06/11 12:10 AM      Component Value Range Comment   Sodium 139  135 - 145 (mEq/L)    Potassium 3.1 (*) 3.5 - 5.1 (mEq/L)    Chloride 104  96 - 112 (mEq/L)    CO2 26  19 - 32 (mEq/L)    Glucose, Bld 111 (*) 70 - 99 (mg/dL)    BUN 29 (*) 6 - 23 (mg/dL)    Creatinine, Ser 2.84 (*) 0.50 - 1.10 (mg/dL)    Calcium 8.9  8.4 - 10.5 (mg/dL)    Total Protein 6.4  6.0 - 8.3 (g/dL)    Albumin 3.0 (*) 3.5 - 5.2 (g/dL)    AST 15  0 - 37 (U/L)    ALT 8  0 - 35 (U/L)    Alkaline Phosphatase 65  39 - 117 (U/L)    Total Bilirubin 0.2 (*) 0.3 - 1.2 (mg/dL)    GFR calc non Af Amer 34 (*) >90 (mL/min)    GFR  calc Af Amer 40 (*) >90 (mL/min)   TYPE AND SCREEN     Status: Normal (Preliminary result)   Collection Time   04/06/11 12:25 AM      Component Value Range Comment   ABO/RH(D) O POS      Antibody Screen NEG      Sample Expiration 04/09/2011      Unit Number [id number]      Blood Component Type RED CELLS,LR      Unit division 00      Status of Unit ISSUED      Transfusion Status OK TO TRANSFUSE      Crossmatch Result Compatible      Unit Number [id number]        Blood Component Type RED CELLS,LR      Unit division 00      Status of Unit ISSUED      Transfusion Status OK TO TRANSFUSE      Crossmatch Result Compatible     ABO/RH     Status: Normal (Preliminary result)   Collection Time   04/06/11 12:25 AM      Component Value Range Comment   ABO/RH(D) O POS     SAMPLE TO BLOOD BANK     Status: Normal   Collection Time   04/06/11 12:35 AM      Component Value Range Comment   Blood Bank Specimen SAMPLE AVAILABLE FOR TESTING      Sample Expiration 04/07/2011     PREPARE RBC (CROSSMATCH)     Status: Normal   Collection Time   04/06/11  1:30 AM      Component Value Range Comment   Order Confirmation ORDER PROCESSED BY BLOOD BANK      No results found.  Review of Systems  Constitutional: Negative for fever, chills and weight loss.  HENT: Negative for hearing loss.   Eyes: Negative for blurred vision and double vision.  Respiratory: Negative for cough, hemoptysis and sputum production.   Cardiovascular: Negative for chest pain, palpitations and orthopnea.  Gastrointestinal: Negative for heartburn, nausea, vomiting, abdominal pain and diarrhea.  Genitourinary: Negative for dysuria and urgency.  Neurological: Positive for dizziness. Negative for headaches.    Blood pressure 128/61, pulse 81, temperature 98.2 F (36.8 C), temperature source Oral, resp. rate 21, height 5\' 7"  (1.702 m), weight 102 kg (224 lb 13.9 oz), SpO2 100.00%. Physical Exam  Constitutional: She is oriented to person, place, and time. She appears well-developed.  HENT:  Head: Normocephalic.  Eyes:       Conjunctiva are pale sclera nonicteric  Neck: Neck supple. No JVD present. No thyromegaly present.  Cardiovascular:       Irregularly irregular S1-S2 soft there is soft systolic murmur no S3 gallop  Respiratory: Breath sounds normal. She has no wheezes. She has no rales.  GI: Soft. Bowel sounds are normal. She exhibits no distension. There is no tenderness. There is no  rebound and no guarding.  Musculoskeletal: Normal range of motion. She exhibits no edema and no tenderness.  Lymphadenopathy:    She has no cervical adenopathy.  Neurological: She is alert and oriented to person, place, and time.     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  Zamora Colton N 04/06/2011, 7:17 AM

## 2011-04-06 NOTE — ED Notes (Addendum)
PT placed on monitor. Given O2 at 2L

## 2011-04-06 NOTE — Consult Note (Signed)
Reason for Consult: GI bleed and supratherapeutic INR Referring Physician: Rinaldo Cloud, M.D.  Charlann Lange HPI: This is a 71 year old female with multiple medical problems who was admitted for symptomatic anemia.  One month ago she was started on coumadin for atrial fibrillation.  She complained for progressive weakness and Dr. Sharyn Lull checked her bloodwork, which revealed an HGB in the 5 range.  Her INR was also noted to be supratherapeutic in the 9 range.  No complaints of chest pain, nausea, vomiting, or abdominal pain.  She reports having dark stools, but no reports of hematochezia. The patient denies having a colonoscopy in the past and there is no known family history of colon cancer.  Approximately twice per week she will use ibuprofen for her restless legs.  No history of PUD.  Past Medical History  Diagnosis Date  . Coronary artery disease   . Atrial fibrillation     Now NSR  . GERD (gastroesophageal reflux disease)   . Hyperlipidemia   . 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 Anemia     Past Surgical History  Procedure Date  . Cardiac catheterization   . Cardiac valve replacement     2006    History reviewed. No pertinent family history.  Social History:  reports that she has never smoked. She has never used smokeless tobacco. She reports that she does not drink alcohol. Her drug history not on file.  Allergies:  Allergies  Allergen Reactions  . Codeine     REACTION: nausea  . Codeine Phosphate     REACTION: unspecified    Medications:  Scheduled:   . furosemide  40 mg Oral Daily  . hydroxychloroquine  200 mg Oral Daily  . metoprolol  50 mg Oral BID  . pantoprazole  40 mg Oral BID AC  . phytonadione  5 mg Oral To Major  . potassium chloride SA  20 mEq Oral Daily  . predniSONE  5 mg Oral Q breakfast  . rOPINIRole  0.5 mg Oral BID  . rosuvastatin  10 mg Oral Daily   Continuous:   Results for orders placed during the hospital  encounter of 04/05/11 (from the past 24 hour(s))  CBC     Status: Abnormal   Collection Time   04/06/11 12:10 AM      Component Value Range   WBC 4.4  4.0 - 10.5 (K/uL)   RBC 2.12 (*) 3.87 - 5.11 (MIL/uL)   Hemoglobin 6.0 (*) 12.0 - 15.0 (g/dL)   HCT 13.2 (*) 44.0 - 46.0 (%)   MCV 90.1  78.0 - 100.0 (fL)   MCH 28.3  26.0 - 34.0 (pg)   MCHC 31.4  30.0 - 36.0 (g/dL)   RDW 10.2 (*) 72.5 - 15.5 (%)   Platelets 195  150 - 400 (K/uL)  DIFFERENTIAL     Status: Abnormal   Collection Time   04/06/11 12:10 AM      Component Value Range   Neutrophils Relative 56  43 - 77 (%)   Neutro Abs 2.5  1.7 - 7.7 (K/uL)   Lymphocytes Relative 29  12 - 46 (%)   Lymphs Abs 1.3  0.7 - 4.0 (K/uL)   Monocytes Relative 13 (*) 3 - 12 (%)   Monocytes Absolute 0.6  0.1 - 1.0 (K/uL)   Eosinophils Relative 2  0 - 5 (%)   Eosinophils Absolute 0.1  0.0 - 0.7 (K/uL)   Basophils Relative  1  0 - 1 (%)   Basophils Absolute 0.0  0.0 - 0.1 (K/uL)  PROTIME-INR     Status: Abnormal   Collection Time   04/06/11 12:10 AM      Component Value Range   Prothrombin Time 63.0 (*) 11.6 - 15.2 (seconds)   INR 7.25 (*) 0.00 - 1.49   APTT     Status: Abnormal   Collection Time   04/06/11 12:10 AM      Component Value Range   aPTT 61 (*) 24 - 37 (seconds)  COMPREHENSIVE METABOLIC PANEL     Status: Abnormal   Collection Time   04/06/11 12:10 AM      Component Value Range   Sodium 139  135 - 145 (mEq/L)   Potassium 3.1 (*) 3.5 - 5.1 (mEq/L)   Chloride 104  96 - 112 (mEq/L)   CO2 26  19 - 32 (mEq/L)   Glucose, Bld 111 (*) 70 - 99 (mg/dL)   BUN 29 (*) 6 - 23 (mg/dL)   Creatinine, Ser 4.09 (*) 0.50 - 1.10 (mg/dL)   Calcium 8.9  8.4 - 81.1 (mg/dL)   Total Protein 6.4  6.0 - 8.3 (g/dL)   Albumin 3.0 (*) 3.5 - 5.2 (g/dL)   AST 15  0 - 37 (U/L)   ALT 8  0 - 35 (U/L)   Alkaline Phosphatase 65  39 - 117 (U/L)   Total Bilirubin 0.2 (*) 0.3 - 1.2 (mg/dL)   GFR calc non Af Amer 34 (*) >90 (mL/min)   GFR calc Af Amer 40 (*) >90  (mL/min)  TYPE AND SCREEN     Status: Normal (Preliminary result)   Collection Time   04/06/11 12:25 AM      Component Value Range   ABO/RH(D) O POS     Antibody Screen NEG     Sample Expiration 04/09/2011     Unit Number [id number]     Blood Component Type RED CELLS,LR     Unit division 00     Status of Unit ISSUED     Transfusion Status OK TO TRANSFUSE     Crossmatch Result Compatible     Unit Number [id number]     Blood Component Type RED CELLS,LR     Unit division 00     Status of Unit ISSUED     Transfusion Status OK TO TRANSFUSE     Crossmatch Result Compatible    ABO/RH     Status: Normal   Collection Time   04/06/11 12:25 AM      Component Value Range   ABO/RH(D) O POS    SAMPLE TO BLOOD BANK     Status: Normal   Collection Time   04/06/11 12:35 AM      Component Value Range   Blood Bank Specimen SAMPLE AVAILABLE FOR TESTING     Sample Expiration 04/07/2011    PREPARE RBC (CROSSMATCH)     Status: Normal   Collection Time   04/06/11  1:30 AM      Component Value Range   Order Confirmation ORDER PROCESSED BY BLOOD BANK    MRSA PCR SCREENING     Status: Normal   Collection Time   04/06/11  5:17 AM      Component Value Range   MRSA by PCR NEGATIVE  NEGATIVE   COMPREHENSIVE METABOLIC PANEL     Status: Abnormal   Collection Time   04/06/11  9:58 AM      Component Value Range  Sodium 140  135 - 145 (mEq/L)   Potassium 3.5  3.5 - 5.1 (mEq/L)   Chloride 106  96 - 112 (mEq/L)   CO2 26  19 - 32 (mEq/L)   Glucose, Bld 98  70 - 99 (mg/dL)   BUN 27 (*) 6 - 23 (mg/dL)   Creatinine, Ser 4.09 (*) 0.50 - 1.10 (mg/dL)   Calcium 8.7  8.4 - 81.1 (mg/dL)   Total Protein 5.9 (*) 6.0 - 8.3 (g/dL)   Albumin 2.8 (*) 3.5 - 5.2 (g/dL)   AST 14  0 - 37 (U/L)   ALT 8  0 - 35 (U/L)   Alkaline Phosphatase 61  39 - 117 (U/L)   Total Bilirubin 0.5  0.3 - 1.2 (mg/dL)   GFR calc non Af Amer 42 (*) >90 (mL/min)   GFR calc Af Amer 49 (*) >90 (mL/min)  CBC     Status: Abnormal   Collection  Time   04/06/11  9:58 AM      Component Value Range   WBC 3.7 (*) 4.0 - 10.5 (K/uL)   RBC 2.61 (*) 3.87 - 5.11 (MIL/uL)   Hemoglobin 7.3 (*) 12.0 - 15.0 (g/dL)   HCT 91.4 (*) 78.2 - 46.0 (%)   MCV 86.2  78.0 - 100.0 (fL)   MCH 28.0  26.0 - 34.0 (pg)   MCHC 32.4  30.0 - 36.0 (g/dL)   RDW 95.6 (*) 21.3 - 15.5 (%)   Platelets 131 (*) 150 - 400 (K/uL)  MAGNESIUM     Status: Normal   Collection Time   04/06/11  9:58 AM      Component Value Range   Magnesium 2.2  1.5 - 2.5 (mg/dL)  PROTIME-INR     Status: Abnormal   Collection Time   04/06/11  9:58 AM      Component Value Range   Prothrombin Time 41.2 (*) 11.6 - 15.2 (seconds)   INR 4.21 (*) 0.00 - 1.49      No results found.  ROS:  As stated above in the HPI otherwise negative.  Blood pressure 120/66, pulse 92, temperature 98.4 F (36.9 C), temperature source Oral, resp. rate 20, height 5\' 7"  (1.702 m), weight 102 kg (224 lb 13.9 oz), SpO2 99.00%.    PE: Gen: NAD, Alert and Oriented HEENT:  [place]/AT, EOMI Neck: Supple, no LAD Lungs: CTA Bilaterally CV: RRR without M/G/R ABM: Soft, NTND, +BS Ext: No C/C/E Rectal: Dark and reddish stool  Assessment/Plan: 1) Heme positive stool. 2) Symptomatic anemia. 3) Atrial fibrillation with supratherapeutic INR.   The patient has a GI bleed and the use of coumadin is a "stress test" for the GI tract.  Since she never had a colonoscopy in the past she will benefit with an EGD/Colonoscopy.  Plan: 1) EGD/Colonoscopy tomorrow. 2) Reverse INR with FFP. 3) Transfuse with 2 units of PRBC.  Nazeer Romney D 04/06/2011, 2:13 PM

## 2011-04-06 NOTE — ED Notes (Signed)
Blood transfusion completed without any signs of reaction. Pt is resting denies any needs or pain at this time. Awaiting transport to floor

## 2011-04-07 DIAGNOSIS — K922 Gastrointestinal hemorrhage, unspecified: Secondary | ICD-10-CM

## 2011-04-07 DIAGNOSIS — D62 Acute posthemorrhagic anemia: Secondary | ICD-10-CM

## 2011-04-07 DIAGNOSIS — D649 Anemia, unspecified: Principal | ICD-10-CM

## 2011-04-07 LAB — COMPREHENSIVE METABOLIC PANEL
AST: 17 U/L (ref 0–37)
Albumin: 3.2 g/dL — ABNORMAL LOW (ref 3.5–5.2)
Calcium: 9.4 mg/dL (ref 8.4–10.5)
Creatinine, Ser: 1.04 mg/dL (ref 0.50–1.10)
Total Protein: 6.4 g/dL (ref 6.0–8.3)

## 2011-04-07 LAB — CBC
Hemoglobin: 9 g/dL — ABNORMAL LOW (ref 12.0–15.0)
MCH: 28.2 pg (ref 26.0–34.0)
MCV: 85.6 fL (ref 78.0–100.0)
RBC: 3.19 MIL/uL — ABNORMAL LOW (ref 3.87–5.11)
WBC: 3.7 10*3/uL — ABNORMAL LOW (ref 4.0–10.5)

## 2011-04-07 LAB — APTT: aPTT: 33 seconds (ref 24–37)

## 2011-04-07 MED ORDER — PANTOPRAZOLE SODIUM 40 MG PO TBEC
40.0000 mg | DELAYED_RELEASE_TABLET | Freq: Every day | ORAL | Status: DC
  Administered 2011-04-08 – 2011-04-15 (×8): 40 mg via ORAL
  Filled 2011-04-07 (×8): qty 1

## 2011-04-07 MED ORDER — PEG 3350-KCL-NA BICARB-NACL 420 G PO SOLR
4000.0000 mL | Freq: Once | ORAL | Status: DC
  Filled 2011-04-07: qty 4000

## 2011-04-07 MED ORDER — PEG 3350-KCL-NA BICARB-NACL 420 G PO SOLR
2000.0000 mL | Freq: Once | ORAL | Status: AC
  Administered 2011-04-07: 2000 mL via ORAL
  Filled 2011-04-07: qty 4000

## 2011-04-07 MED ORDER — POTASSIUM CHLORIDE CRYS ER 20 MEQ PO TBCR
20.0000 meq | EXTENDED_RELEASE_TABLET | Freq: Three times a day (TID) | ORAL | Status: AC
  Administered 2011-04-07 – 2011-04-08 (×5): 20 meq via ORAL
  Filled 2011-04-07 (×5): qty 1

## 2011-04-07 NOTE — Progress Notes (Signed)
For Dr Jeani Hawking      Brooke Dare Daily Rounding Note 04/07/2011, 8:22 AM  SUBJECTIVE: INR now corrected.  Patient never received colonoscopy prep.  Has had no po's or IVF.  Not dizzy after receiving transfusion.    OBJECTIVE: General: Looks well, obese and a bit pale.  Vital signs in last 24 hours: Temp:  [97.8 F (36.6 C)-99.7 F (37.6 C)] 99 F (37.2 C) (01/12 0445) Pulse Rate:  [54-131] 79  (01/12 0600) Resp:  [7-27] 22  (01/12 0600) BP: (83-144)/(39-101) 127/95 mmHg (01/12 0600) SpO2:  [92 %-100 %] 99 % (01/12 0600) Weight:  [102 kg (224 lb 13.9 oz)] 102 kg (224 lb 13.9 oz) (01/12 0500) Last BM Date: 04/05/11  Heart: Irreg/Irreg.  Not tachy Chest: Clear, not SOB Abdomen: Soft, NT,ND  BS active.  Extremities: no edema Neuro/Psych:  Pleasant, fully oriented.  Intake/Output from previous day: 01/11 0701 - 01/12 0700 In: 1241 [Blood:1241] Out: 1100 [Urine:1100]  Intake/Output this shift:    Lab Results:  Basename 04/07/11 0610 04/06/11 2038 04/06/11 0958  WBC 3.7* 3.7* 3.7*  HGB 9.0* 9.3* 7.3*  HCT 27.3* 28.1* 22.5*  PLT 114* 122* 131*   BMET  Basename 04/07/11 0610 04/06/11 0958 04/06/11 0010  NA 141 140 139  K 3.4* 3.5 3.1*  CL 105 106 104  CO2 29 26 26   GLUCOSE 87 98 111*  BUN 21 27* 29*  CREATININE 1.04 1.24* 1.48*  CALCIUM 9.4 8.7 8.9   LFT  Basename 04/07/11 0610  PROT 6.4  ALBUMIN 3.2*  AST 17  ALT 9  ALKPHOS 65  BILITOT 0.7  BILIDIR --  IBILI --   PT/INR  Basename 04/07/11 0610 04/06/11 0958  LABPROT 17.9* 41.2*  INR 1.45 4.21*   Hepatitis Panel No results found for this basename: HEPBSAG,HCVAB,HEPAIGM,HEPBIGM in the last 72 hours  Studies/Results: No results found.  ASSESMENT: Anemia, improved post PRBCs Chronic Coumadin for A Fib.  Coagulopathy, corrected with FFP and vit K Dark heme + stool   PLAN: Feed pt solids this AM, thereafter clears. Prep tonight  Colonoscopy tomorrow   LOS: 2 days   Jennye Moccasin   04/07/2011, 8:22 AM Pager: [phone]  INR is down to 1.4, we will go ahead with EGD/colon tomorrow.

## 2011-04-07 NOTE — Progress Notes (Signed)
Subjective:  S/P GI bleed and anemia. No chest pain.  Objective:  Vital Signs in the last 24 hours: Temp:  [97.8 F (36.6 C)-99.7 F (37.6 C)] 98.5 F (36.9 C) (01/12 0800) Pulse Rate:  [54-131] 93  (01/12 0800) Cardiac Rhythm:  [-] Atrial fibrillation (01/12 0500) Resp:  [7-27] 21  (01/12 0800) BP: (83-144)/(39-101) 123/46 mmHg (01/12 0800) SpO2:  [92 %-100 %] 97 % (01/12 0800) Weight:  [102 kg (224 lb 13.9 oz)] 102 kg (224 lb 13.9 oz) (01/12 0500)  Physical Exam: BP Readings from Last 1 Encounters:  04/07/11 123/46    Wt Readings from Last 1 Encounters:  04/07/11 102 kg (224 lb 13.9 oz)    Weight change: 0 kg (0 lb)  HEENT: [place]/AT, Eyes-Blue, PERL, EOMI, Conjunctiva-Pale pnk, Sclera-Non-icteric Neck: No JVD, No bruit, Trachea midline. Lungs:  Clear, Bilateral. Cardiac:  Irregular rhythm, normal S1 and S2, no S3.  Abdomen:  Soft, non-tender. Extremities:  No edema present. No cyanosis. No clubbing. CNS: AxOx3, Cranial nerves grossly intact, moves all 4 extremities. Right handed. Skin: Warm and dry.   Intake/Output from previous day: 01/11 0701 - 01/12 0700 In: 1241 [Blood:1241] Out: 1100 [Urine:1100]    Lab Results: BMET    Component Value Date/Time   NA 141 04/07/2011 0610   K 3.4* 04/07/2011 0610   CL 105 04/07/2011 0610   CO2 29 04/07/2011 0610   GLUCOSE 87 04/07/2011 0610   BUN 21 04/07/2011 0610   CREATININE 1.04 04/07/2011 0610   CALCIUM 9.4 04/07/2011 0610   GFRNONAA 52* 04/07/2011 0610   GFRAA 61* 04/07/2011 0610   CBC    Component Value Date/Time   WBC 3.7* 04/07/2011 0610   RBC 3.19* 04/07/2011 0610   HGB 9.0* 04/07/2011 0610   HCT 27.3* 04/07/2011 0610   PLT 114* 04/07/2011 0610   MCV 85.6 04/07/2011 0610   MCH 28.2 04/07/2011 0610   MCHC 33.0 04/07/2011 0610   RDW 18.7* 04/07/2011 0610   LYMPHSABS 1.3 04/06/2011 0010   MONOABS 0.6 04/06/2011 0010   EOSABS 0.1 04/06/2011 0010   BASOSABS 0.0 04/06/2011 0010   CARDIAC ENZYMES Lab Results  Component Value  Date   CKTOTAL 68 11/30/2010   CKMB 2.4 11/30/2010   TROPONINI <0.30 11/30/2010    Assessment/Plan:  Acute anemia rule out GI bleeding-Stable  Coumadin toxicity-resolved Coronary artery disease status post CABG in the past and aVR  Chronic atrial fibrillation-rate controlled Hypertension - stable Hypercholesteremia  Morbid obesity  History of rheumatoid arthritis  History of anemia of chronic disease  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          K+ replacement   LOS: 2 days    Orpah Cobb  MD  04/07/2011, 9:03 AM

## 2011-04-08 ENCOUNTER — Encounter (HOSPITAL_COMMUNITY)
Admission: EM | Disposition: A | Discharge status: Home / Self Care | Payer: Self-pay | Source: Home / Self Care | Attending: Cardiology

## 2011-04-08 ENCOUNTER — Encounter (HOSPITAL_COMMUNITY): Payer: Self-pay | Admitting: *Deleted

## 2011-04-08 ENCOUNTER — Other Ambulatory Visit: Payer: Self-pay | Admitting: Internal Medicine

## 2011-04-08 DIAGNOSIS — D131 Benign neoplasm of stomach: Secondary | ICD-10-CM

## 2011-04-08 DIAGNOSIS — R195 Other fecal abnormalities: Secondary | ICD-10-CM

## 2011-04-08 LAB — CBC
HCT: 27.9 % — ABNORMAL LOW (ref 36.0–46.0)
Hemoglobin: 8.8 g/dL — ABNORMAL LOW (ref 12.0–15.0)
MCH: 27.3 pg (ref 26.0–34.0)
MCHC: 31.5 g/dL (ref 30.0–36.0)
MCV: 86.6 fL (ref 78.0–100.0)
Platelets: 98 K/uL — ABNORMAL LOW (ref 150–400)
RBC: 3.22 MIL/uL — ABNORMAL LOW (ref 3.87–5.11)
RDW: 18.4 % — ABNORMAL HIGH (ref 11.5–15.5)
WBC: 4.2 K/uL (ref 4.0–10.5)

## 2011-04-08 LAB — BASIC METABOLIC PANEL WITH GFR
BUN: 15 mg/dL (ref 6–23)
CO2: 27 meq/L (ref 19–32)
Calcium: 8.9 mg/dL (ref 8.4–10.5)
Chloride: 104 meq/L (ref 96–112)
Creatinine, Ser: 1.04 mg/dL (ref 0.50–1.10)
GFR calc Af Amer: 61 mL/min — ABNORMAL LOW (ref >90)
GFR calc non Af Amer: 52 mL/min — ABNORMAL LOW (ref >90)
Glucose, Bld: 84 mg/dL (ref 70–99)
Potassium: 3.9 meq/L (ref 3.5–5.1)
Sodium: 139 meq/L (ref 135–145)

## 2011-04-08 SURGERY — COLONOSCOPY WITH ESOPHAGOGASTRODUODENOSCOPY (EGD)
Anesthesia: Moderate Sedation | Laterality: Left

## 2011-04-08 MED ORDER — SODIUM CHLORIDE 0.9 % IV SOLN
Freq: Once | INTRAVENOUS | Status: AC
  Administered 2011-04-09: 16:00:00 via INTRAVENOUS

## 2011-04-08 MED ORDER — MIDAZOLAM HCL 10 MG/2ML IJ SOLN
INTRAMUSCULAR | Status: AC
  Filled 2011-04-08: qty 4

## 2011-04-08 MED ORDER — BUTAMBEN-TETRACAINE-BENZOCAINE 2-2-14 % EX AERO
INHALATION_SPRAY | CUTANEOUS | Status: DC | PRN
  Administered 2011-04-08: 2 via TOPICAL

## 2011-04-08 MED ORDER — DIPHENHYDRAMINE HCL 50 MG/ML IJ SOLN
INTRAMUSCULAR | Status: AC
  Filled 2011-04-08: qty 1

## 2011-04-08 MED ORDER — FENTANYL CITRATE 0.05 MG/ML IJ SOLN
INTRAMUSCULAR | Status: DC | PRN
  Administered 2011-04-08: 25 ug via INTRAVENOUS
  Administered 2011-04-08: 12.5 ug via INTRAVENOUS
  Administered 2011-04-08: 25 ug via INTRAVENOUS

## 2011-04-08 MED ORDER — ZOLPIDEM TARTRATE 5 MG PO TABS
5.0000 mg | ORAL_TABLET | Freq: Every evening | ORAL | Status: DC | PRN
  Administered 2011-04-08 – 2011-04-14 (×5): 5 mg via ORAL
  Filled 2011-04-08 (×5): qty 1

## 2011-04-08 MED ORDER — FENTANYL CITRATE 0.05 MG/ML IJ SOLN
INTRAMUSCULAR | Status: AC
  Filled 2011-04-08: qty 4

## 2011-04-08 MED ORDER — WARFARIN SODIUM 4 MG PO TABS
4.0000 mg | ORAL_TABLET | Freq: Once | ORAL | Status: AC
  Administered 2011-04-08: 4 mg via ORAL
  Filled 2011-04-08: qty 1

## 2011-04-08 MED ORDER — MIDAZOLAM HCL 10 MG/2ML IJ SOLN
INTRAMUSCULAR | Status: DC | PRN
  Administered 2011-04-08 (×2): 1 mg via INTRAVENOUS
  Administered 2011-04-08: 2 mg via INTRAVENOUS

## 2011-04-08 NOTE — Op Note (Signed)
[facility] [address] [place], [place]  [address]  ENDOSCOPY PROCEDURE REPORT  PATIENT:  Valerie, [name]  MR#:  [id number] BIRTHDATE:  1940/12/06, 70 yrs. old  GENDER:  female  ENDOSCOPIST:  Hedwig Morton. Juanda Chance, MD Referred by:  Rinaldo Cloud, M.D.  PROCEDURE DATE:  04/08/2011 PROCEDURE:  EGD with biopsy, [id number] ASA CLASS:  Class III INDICATIONS:  anemia, hemoccult positive stool INR 7.0 on admission, pt on Coumadin for at.fib  MEDICATIONS:   These medications were titrated to patient response per physician's verbal order, Versed 3 mg, Fentanyl 50 mcg TOPICAL ANESTHETIC:  Cetacaine Spray  DESCRIPTION OF PROCEDURE:   After the risks benefits and alternatives of the procedure were thoroughly explained, informed consent was obtained.  The [id number] ([id number]) and [id number] ([id number]) endoscope was introduced through the mouth and advanced to the second portion of the duodenum, without limitations.  The instrument was slowly withdrawn as the mucosa was fully examined. <<PROCEDUREIMAGES>>  There were multiple polyps identified. 8-10 mm sessile polyps in the prepyloric antrum, no stigmata of bleeding With standard forceps, a biopsy was obtained and sent to pathology (see image002 and image005).  Mild gastritis was found. atrophic appearing gastric mucosa with decreased rugal folds With standard forceps, a biopsy was obtained and sent to pathology (see image006).  A Schatzki's ring was found (see image001 and image007). nonobstructing ring at g-e junction, not dilated  Otherwise the examination was normal (see image004 and image003).    Retroflexed views revealed no abnormalities.    The scope was then withdrawn from the patient and the procedure completed.  COMPLICATIONS:  None  ENDOSCOPIC IMPRESSION: 1) Polyps, multiple 2) Mild gastritis 3) Schatzki's ring 4) Otherwise normal examination atrophic appearing gastric mucosa, r/o Pernicious anemia, biopsies  taken nothing to account for GIB RECOMMENDATIONS: 1) Await biopsy results colonoscopy  REPEAT EXAM:  In 0 year(s) for.  ______________________________ Hedwig Morton. Juanda Chance, MD  CC:  n. eSIGNED:   Hedwig Morton. Teagan Heidrick at 04/08/2011 09:22 AM  Marylou Mccoy, [phone]

## 2011-04-08 NOTE — Progress Notes (Signed)
ANTICOAGULATION CONSULT NOTE - Initial Consult  Pharmacy Consult for Coumadin Indication: Afib  Allergies  Allergen Reactions  . Codeine     REACTION: nausea  . Codeine Phosphate     REACTION: unspecified    Patient Measurements: Height: 5\' 7"  (170.2 cm) Weight: 224 lb 13.9 oz (102 kg) IBW/kg (Calculated) : 61.6  Adjusted Body Weight:   Vital Signs: Temp: 98 F (36.7 C) (01/13 0400) Temp src: Oral (01/13 0400) BP: 129/49 mmHg (01/13 0940) Pulse Rate: 85  (01/13 0500)  Labs:  Basename 04/08/11 0500 04/07/11 0610 04/06/11 2038 04/06/11 0958 04/06/11 0010  HGB 8.8* 9.0* -- -- --  HCT 27.9* 27.3* 28.1* -- --  PLT 98* 114* 122* -- --  APTT -- 33 -- -- 61*  LABPROT -- 17.9* -- 41.2* 63.0*  INR -- 1.45 -- 4.21* 7.25*  HEPARINUNFRC -- -- -- -- --  CREATININE 1.04 1.04 -- 1.24* --  CKTOTAL -- -- -- -- --  CKMB -- -- -- -- --  TROPONINI -- -- -- -- --   Estimated Creatinine Clearance: 60.1 ml/min (by C-G formula based on Cr of 1.04).  Medical History: Past Medical History  Diagnosis Date  . Coronary artery disease   . Atrial fibrillation     Now NSR  . GERD (gastroesophageal reflux disease)   . Hyperlipidemia   . 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 Anemia   . Angina   . Shortness of breath     with activity  . Hypertension   . Blood transfusion   . Pneumonia   . H/O hiatal hernia   . Lupus     Medications:  Scheduled:    . sodium chloride   Intravenous Once  . furosemide  40 mg Oral Daily  . hydroxychloroquine  200 mg Oral Daily  . metoprolol  50 mg Oral BID  . pantoprazole  40 mg Oral Q1200  . polyethylene glycol-electrolytes  2,000 mL Oral Once  . potassium chloride SA  20 mEq Oral TID  . predniSONE  5 mg Oral Q breakfast  . rOPINIRole  0.5 mg Oral BID  . rosuvastatin  10 mg Oral Daily  . DISCONTD: polyethylene glycol-electrolytes  4,000 mL Oral Once    Assessment: 70yoF initiated on Coumadin 1 month ago for Afib.   Admitted to ED with fatigue, hgb 5.9, and INR >8.  Pt received 2 units PRBC, 2 units FFP, and Vit K 5mg  PO.  INR  Dropped -->1.45, now subtherapeutic.  Pharmacy asked to manage Coumadin.  Home regimen was Coumadin 5mg  daily. INR may be resistant to coumadin for a few days d/t Vitamin K, and 5mg  daily may be too high for pt.  Will f/u closely and adjust accordingly.  Pt also on SCDs. Hgb trending down 9.3-->9.0-->8.8.   EGD/colonoscopy performed 01/13, per report no source of bleeding.   Goal of Therapy:  INR 2.0-3.0   Plan:  1. Coumadin 4mg  po x 1 tonight. 2. Daily INR 3. F/u Hgb,INR in AM.

## 2011-04-08 NOTE — Progress Notes (Signed)
Patient being transported to Endoscopy via wheelchair accompanied by endo staff, EW. Patient in no distress @ 0705.

## 2011-04-08 NOTE — Progress Notes (Signed)
Subjective:  No source of GI bleed. No new complaints.  Objective:  Vital Signs in the last 24 hours: Temp:  [97.9 F (36.6 C)-99.7 F (37.6 C)] 98 F (36.7 C) (01/13 0400) Pulse Rate:  [66-88] 85  (01/13 0500) Cardiac Rhythm:  [-] Atrial fibrillation (01/13 0400) Resp:  [14-28] 21  (01/13 0930) BP: (112-144)/(49-93) 129/49 mmHg (01/13 0940) SpO2:  [90 %-100 %] 93 % (01/13 0930)  Physical Exam: BP Readings from Last 1 Encounters:  04/08/11 129/49    Wt Readings from Last 1 Encounters:  04/07/11 102 kg (224 lb 13.9 oz)    Weight change:   HEENT: [place]/AT, Eyes-Blue, PERL, EOMI, Conjunctiva-Pale pink, Sclera-Non-icteric Neck: No JVD, No bruit, Trachea midline. Lungs:  Clear, Bilateral. Cardiac:  Regular rhythm, normal S1 and S2, no S3.  Abdomen:  Soft, non-tender. Extremities:  No edema present. No cyanosis. No clubbing. CNS: AxOx3, Cranial nerves grossly intact, moves all 4 extremities. Right handed. Skin: Warm and dry.   Intake/Output from previous day: 01/12 0701 - 01/13 0700 In: 1680 [P.O.:1680] Out: -     Lab Results: BMET    Component Value Date/Time   NA 139 04/08/2011 0500   K 3.9 04/08/2011 0500   CL 104 04/08/2011 0500   CO2 27 04/08/2011 0500   GLUCOSE 84 04/08/2011 0500   BUN 15 04/08/2011 0500   CREATININE 1.04 04/08/2011 0500   CALCIUM 8.9 04/08/2011 0500   GFRNONAA 52* 04/08/2011 0500   GFRAA 61* 04/08/2011 0500   CBC    Component Value Date/Time   WBC 4.2 04/08/2011 0500   RBC 3.22* 04/08/2011 0500   HGB 8.8* 04/08/2011 0500   HCT 27.9* 04/08/2011 0500   PLT 98* 04/08/2011 0500   MCV 86.6 04/08/2011 0500   MCH 27.3 04/08/2011 0500   MCHC 31.5 04/08/2011 0500   RDW 18.4* 04/08/2011 0500   LYMPHSABS 1.3 04/06/2011 0010   MONOABS 0.6 04/06/2011 0010   EOSABS 0.1 04/06/2011 0010   BASOSABS 0.0 04/06/2011 0010   CARDIAC ENZYMES Lab Results  Component Value Date   CKTOTAL 68 11/30/2010   CKMB 2.4 11/30/2010   TROPONINI <0.30 11/30/2010    Assessment/Plan: Acute  anemia possible GI bleeding-Stable Colonoscopy. 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    LOS: 3 days    Orpah Cobb  MD  04/08/2011, 10:22 AM

## 2011-04-08 NOTE — Progress Notes (Signed)
Pt underwent EGD/colonoscopy today. Please see Endoscopy report: atrophic appearing gastric mucosa with antral polyps, biopsies taken to r/o PA, will check B12 and Gastrin. Colonoscopy showed only 1 solitary shallow diverticulum in the right colon. There were no stigmata of recent bleeding. This pt is a high risk for avm's and therefore if her H/H continues to drop I would recommend SBCE ( Given's capsule) to look for small bowl avm"s.

## 2011-04-08 NOTE — Op Note (Signed)
[facility] [address] [place], [place]  [address]  COLONOSCOPY PROCEDURE REPORT  PATIENT:  Valerie, [name]  MR#:  [id number] BIRTHDATE:  [birth year], 70 yrs. old  GENDER:  female ENDOSCOPIST:  Hedwig Morton. Juanda Chance, MD REF. BY:  Rinaldo Cloud, M.D. PROCEDURE DATE:  04/08/2011 PROCEDURE:  Colonoscopy 308[address] ASA CLASS:  Class III INDICATIONS:  Anemia, heme positive stool at.fib/Coumadin, INR on admission was supratherapeutic MEDICATIONS:   These medications were titrated to patient response per physician's verbal order, Versed 1 mg, Fentanyl 12.5 mcg  DESCRIPTION OF PROCEDURE:   After the risks and benefits and of the procedure were explained, informed consent was obtained. Digital rectal exam was performed and revealed no rectal masses. The Pentax Colonoscope [id number], [id number]) and [id number] ([id number]) endoscope was introduced through the anus and advanced to the cecum, which was identified by the ileocecal valve.  The quality of the prep was excellent, using MoviPrep.  The instrument was then slowly withdrawn as the colon was fully examined. <<PROCEDUREIMAGES>>  FINDINGS:  A diverticulum was found (see image001). 1 shallow diverticulum in the right colon  This was otherwise a normal examination of the colon (see image002, image003, image004, image005, and image007).   Retroflexed views in the rectum revealed no abnormalities.    The scope was then withdrawn from the patient and the procedure completed.  COMPLICATIONS:  None ENDOSCOPIC IMPRESSION: 1) Diverticulum 2) Otherwise normal examination no active or recent bleeding, appendiceal opening not reached although cecal pouch visualized RECOMMENDATIONS: resume Coumadin, followH/H, if bleeding continues consider SBCE   REPEAT EXAM:  In 0 year(s) for.  ______________________________ Hedwig Morton. Juanda Chance, MD  CC:  n. eSIGNED:   Hedwig Morton. Brodie at 04/08/2011 09:28 AM  Marylou Mccoy, [phone]

## 2011-04-09 ENCOUNTER — Encounter (HOSPITAL_COMMUNITY)
Admission: EM | Disposition: A | Discharge status: Home / Self Care | Payer: Self-pay | Source: Home / Self Care | Attending: Cardiology

## 2011-04-09 HISTORY — PX: GIVENS CAPSULE STUDY: SHX5432

## 2011-04-09 LAB — CBC
Platelets: 108 10*3/uL — ABNORMAL LOW (ref 150–400)
RBC: 3.19 MIL/uL — ABNORMAL LOW (ref 3.87–5.11)
RDW: 18.3 % — ABNORMAL HIGH (ref 11.5–15.5)
WBC: 4.3 10*3/uL (ref 4.0–10.5)

## 2011-04-09 LAB — PROTIME-INR
INR: 1.27 (ref 0.00–1.49)
Prothrombin Time: 16.2 seconds — ABNORMAL HIGH (ref 11.6–15.2)

## 2011-04-09 LAB — VITAMIN B12: Vitamin B-12: 366 pg/mL (ref 211–911)

## 2011-04-09 SURGERY — IMAGING PROCEDURE, GI TRACT, INTRALUMINAL, VIA CAPSULE

## 2011-04-09 MED ORDER — WARFARIN SODIUM 4 MG PO TABS
4.0000 mg | ORAL_TABLET | Freq: Once | ORAL | Status: AC
  Administered 2011-04-09: 4 mg via ORAL
  Filled 2011-04-09 (×2): qty 1

## 2011-04-09 MED ORDER — HEPARIN SOD (PORCINE) IN D5W 100 UNIT/ML IV SOLN
1750.0000 [IU]/h | INTRAVENOUS | Status: DC
  Administered 2011-04-09: 1200 [IU]/h via INTRAVENOUS
  Administered 2011-04-10: 1500 [IU]/h via INTRAVENOUS
  Administered 2011-04-11 – 2011-04-12 (×3): 1750 [IU]/h via INTRAVENOUS
  Filled 2011-04-09 (×7): qty 250

## 2011-04-09 NOTE — Progress Notes (Signed)
Received verbal order for [facility] Capsule study from Dr. Jeani Hawking.

## 2011-04-09 NOTE — Progress Notes (Signed)
Patient ID: [name], female   DOB: February 22, 1941, 71 y.o.   MRN: [id number] Subjective: No acute events.  Objective: Vital signs in last 24 hours: Temp:  [97.5 F (36.4 C)-99.7 F (37.6 C)] 97.5 F (36.4 C) (01/14 0425) Pulse Rate:  [69-96] 82  (01/14 0425) Resp:  [18-24] 24  (01/14 0123) BP: (112-155)/(45-98) 141/67 mmHg (01/14 0425) SpO2:  [83 %-98 %] 95 % (01/14 0425) Weight:  [102.8 kg (226 lb 10.1 oz)] 102.8 kg (226 lb 10.1 oz) (01/14 0500) Last BM Date: 04/08/11  Intake/Output from previous day: 01/13 0701 - 01/14 0700 In: 990 [P.O.:840; I.V.:150] Out: 400 [Urine:400] Intake/Output this shift:    General appearance: alert and no distress GI: soft, non-tender; bowel sounds normal; no masses,  no organomegaly  Lab Results:  Basename 04/08/11 0500 04/07/11 0610 04/06/11 2038  WBC 4.2 3.7* 3.7*  HGB 8.8* 9.0* 9.3*  HCT 27.9* 27.3* 28.1*  PLT 98* 114* 122*   BMET  Basename 04/08/11 0500 04/07/11 0610 04/06/11 0958  NA 139 141 140  K 3.9 3.4* 3.5  CL 104 105 106  CO2 27 29 26   GLUCOSE 84 87 98  BUN 15 21 27*  CREATININE 1.04 1.04 1.24*  CALCIUM 8.9 9.4 8.7   LFT  Basename 04/07/11 0610  PROT 6.4  ALBUMIN 3.2*  AST 17  ALT 9  ALKPHOS 65  BILITOT 0.7  BILIDIR --  IBILI --   PT/INR  Basename 04/07/11 0610 04/06/11 0958  LABPROT 17.9* 41.2*  INR 1.45 4.21*   Hepatitis Panel No results found for this basename: HEPBSAG,HCVAB,HEPAIGM,HEPBIGM in the last 72 hours C-Diff No results found for this basename: CDIFFTOX:3 in the last 72 hours Fecal Lactopherrin No results found for this basename: FECLLACTOFRN in the last 72 hours  Studies/Results: No results found.  Medications:  Scheduled:   . sodium chloride   Intravenous Once  . furosemide  40 mg Oral Daily  . hydroxychloroquine  200 mg Oral Daily  . metoprolol  50 mg Oral BID  . pantoprazole  40 mg Oral Q1200  . potassium chloride SA  20 mEq Oral TID  . predniSONE  5 mg Oral Q breakfast  .  rOPINIRole  0.5 mg Oral BID  . rosuvastatin  10 mg Oral Daily  . warfarin  4 mg Oral ONCE-1800   Continuous:   Assessment/Plan: 1) GI bleed. 2) Atrial fibrillation.   No further bleeding off of coumadin.  The EGD was and colonoscopy were essentially negative.  Further evaluation of the small bowel will be required to identify a bleeding site.  ? AVMs  Plan: 1) Capsule endoscopy today.  LOS: 4 days   Tigran Haynie D 04/09/2011, 8:33 AM

## 2011-04-09 NOTE — Progress Notes (Addendum)
ANTICOAGULATION CONSULT NOTE - Initial Consult  Pharmacy Consult for Coumadin Indication: Afib  Allergies  Allergen Reactions  . Codeine     REACTION: nausea  . Codeine Phosphate     REACTION: unspecified    Patient Measurements: Height: 5\' 7"  (170.2 cm) Weight: 226 lb 10.1 oz (102.8 kg) IBW/kg (Calculated) : 61.6    Vital Signs: Temp: 98.7 F (37.1 C) (01/14 0831) Temp src: Oral (01/14 0831) BP: 154/88 mmHg (01/14 1010) Pulse Rate: 81  (01/14 1010)  Labs:  Basename 04/09/11 0849 04/08/11 0500 04/07/11 0610  HGB 8.7* 8.8* --  HCT 28.0* 27.9* 27.3*  PLT 108* 98* 114*  APTT -- -- 33  LABPROT 16.2* -- 17.9*  INR 1.27 -- 1.45  HEPARINUNFRC -- -- --  CREATININE -- 1.04 1.04  CKTOTAL -- -- --  CKMB -- -- --  TROPONINI -- -- --   Estimated Creatinine Clearance: 60.3 ml/min (by C-G formula based on Cr of 1.04).   Assessment: 70yoF initiated on Coumadin 1 month ago for Afib.  Admitted to ED with fatigue, hgb 5.9, and INR >8.  Pt received 2 units PRBC, 2 units FFP, and Vit K 5mg  PO.  INR  1.27 today after warfarin resumed yesterday.   Home regimen was Coumadin 5mg  daily. INR may be resistant to coumadin for a few days d/t Vitamin K, and 5mg  daily may be too high for pt.  Will f/u closely and adjust accordingly.  Pt also on SCDs. Hgb trending down 9.3-->9.0-->8.8--->8.7.   EGD/colonoscopy performed 01/13, per report no source of bleeding.   Goal of Therapy:  INR 2.0-3.0   Plan:  1. Coumadin 4mg  po x 1 tonight. 2. Daily INR 3. F/u Hgb,INR in AM.    Celedonio Miyamoto, PharmD, BCPS Infectious Disease Pharmacist Pager [phone]  Addendum:  Asked to start heparin drip since INR subtherapeutic.  Will not give a bolus.  Will start at 1200 units/hour.  Check a heparin level in 8 hours.  Adjust per goal.  Daily heparin level and CBC while on heparin. Celedonio Miyamoto, PharmD, BCPS

## 2011-04-09 NOTE — Progress Notes (Signed)
Subjective:  Denies chest pain or shortness of breath Denies abdominal pain capsule study in progress Hemoglobin stable in last today's INR still going down patient remains in A. fib  Objective:  Vital Signs in the last 24 hours: Temp:  [97.5 F (36.4 C)-99.7 F (37.6 C)] 98.2 F (36.8 C) (01/14 1300) Pulse Rate:  [69-94] 86  (01/14 1300) Resp:  [20-24] 20  (01/14 1300) BP: (133-154)/(59-92) 133/79 mmHg (01/14 1300) SpO2:  [83 %-98 %] 98 % (01/14 1300) Weight:  [102.513 kg (226 lb)-102.8 kg (226 lb 10.1 oz)] 102.513 kg (226 lb) (01/14 1200)  Intake/Output from previous day: 01/13 0701 - 01/14 0700 In: 990 [P.O.:840; I.V.:150] Out: 400 [Urine:400] Intake/Output from this shift:    Physical Exam: General appearance: alert and cooperative Neck: no carotid bruit, no JVD and supple, symmetrical, trachea midline Lungs: clear to auscultation bilaterally Heart: irregularly irregular rhythm, S1, S2 normal and Soft systolic murmur noted no S3 gallop Abdomen: soft, non-tender; bowel sounds normal; no masses,  no organomegaly Extremities: extremities normal, atraumatic, no cyanosis or edema  Lab Results:  Basename 04/09/11 0849 04/08/11 0500  WBC 4.3 4.2  HGB 8.7* 8.8*  PLT 108* 98*    Basename 04/08/11 0500 04/07/11 0610  NA 139 141  K 3.9 3.4*  CL 104 105  CO2 27 29  GLUCOSE 84 87  BUN 15 21  CREATININE 1.04 1.04   No results found for this basename: TROPONINI:2,CK,MB:2 in the last 72 hours Hepatic Function Panel  Basename 04/07/11 0610  PROT 6.4  ALBUMIN 3.2*  AST 17  ALT 9  ALKPHOS 65  BILITOT 0.7  BILIDIR --  IBILI --   No results found for this basename: CHOL in the last 72 hours No results found for this basename: PROTIME in the last 72 hours  Imaging: No results found.  Cardiac Studies:  Assessment/Plan:  Status post the GI bleeding workup in progress Status post Coumadin toxicity Coronary artery disease status post CABG and aVR Chronic atrial  fibrillation Hypertension Hypercholesteremia Morbid obesity Rheumatoid arthritis Acute on chronic anemia stable History of pleural pericarditis in the past Plan Continue present management Start heparin per pharmacy protocol with no bolus Check CBC in a.m.  LOS: 4 days    Jonan Seufert N 04/09/2011, 1:36 PM

## 2011-04-10 ENCOUNTER — Encounter: Payer: Self-pay | Admitting: Internal Medicine

## 2011-04-10 ENCOUNTER — Encounter (HOSPITAL_COMMUNITY)
Admission: EM | Disposition: A | Discharge status: Home / Self Care | Payer: Self-pay | Source: Home / Self Care | Attending: Cardiology

## 2011-04-10 LAB — CBC
HCT: 28.6 % — ABNORMAL LOW (ref 36.0–46.0)
Hemoglobin: 9 g/dL — ABNORMAL LOW (ref 12.0–15.0)
MCH: 27.7 pg (ref 26.0–34.0)
MCHC: 31.5 g/dL (ref 30.0–36.0)
RDW: 17.9 % — ABNORMAL HIGH (ref 11.5–15.5)

## 2011-04-10 SURGERY — IMAGING PROCEDURE, GI TRACT, INTRALUMINAL, VIA CAPSULE

## 2011-04-10 MED ORDER — WARFARIN SODIUM 4 MG PO TABS
4.0000 mg | ORAL_TABLET | Freq: Once | ORAL | Status: AC
  Administered 2011-04-10: 4 mg via ORAL
  Filled 2011-04-10: qty 1

## 2011-04-10 MED ORDER — FERROUS SULFATE 325 (65 FE) MG PO TABS
325.0000 mg | ORAL_TABLET | Freq: Three times a day (TID) | ORAL | Status: DC
  Administered 2011-04-10 – 2011-04-15 (×16): 325 mg via ORAL
  Filled 2011-04-10 (×19): qty 1

## 2011-04-10 NOTE — Progress Notes (Signed)
ANTICOAGULATION CONSULT NOTE - Initial Consult  Pharmacy Consult for Heparin Indication: Afib  Allergies  Allergen Reactions  . Codeine     REACTION: nausea  . Codeine Phosphate     REACTION: unspecified    Patient Measurements: Height: 5\' 7"  (170.2 cm) Weight: 228 lb 2.8 oz (103.5 kg) IBW/kg (Calculated) : 61.6    Vital Signs: Temp: 100.2 F (37.9 C) (01/15 0748) Temp src: Oral (01/15 0748) BP: 135/92 mmHg (01/15 0748) Pulse Rate: 86  (01/15 0748)  Labs:  Basename 04/10/11 0837 04/09/11 2329 04/09/11 0849 04/08/11 0500  HGB 9.0* -- 8.7* --  HCT 28.6* -- 28.0* 27.9*  PLT 114* -- 108* 98*  APTT -- -- -- --  LABPROT 16.6* -- 16.2* --  INR 1.32 -- 1.27 --  HEPARINUNFRC 0.21* <0.10* -- --  CREATININE -- -- -- 1.04  CKTOTAL -- -- -- --  CKMB -- -- -- --  TROPONINI -- -- -- --   Estimated Creatinine Clearance: 60.5 ml/min (by C-G formula based on Cr of 1.04).   Assessment: 71 yo female with Afib.  Heparin level 0.21, INR 1.32.  Goal of Therapy:  Heparin level 0.3-0.7   Plan:  Increase Heparin to 1750 units/hr.  Recheck heparin level in 8 hours. Coumadin 4mg  x 1 today Follow-up am labs.  Celedonio Miyamoto, PharmD, BCPS Infectious Disease Pharmacist Pager [phone]

## 2011-04-10 NOTE — Progress Notes (Signed)
ANTICOAGULATION CONSULT NOTE - Initial Consult  Pharmacy Consult for Heparin Indication: Afib  Allergies  Allergen Reactions  . Codeine     REACTION: nausea  . Codeine Phosphate     REACTION: unspecified    Patient Measurements: Height: 5\' 7"  (170.2 cm) Weight: 226 lb (102.513 kg) IBW/kg (Calculated) : 61.6    Vital Signs: Temp: 98 F (36.7 C) (01/14 2100) Temp src: Oral (01/14 2100) BP: 144/73 mmHg (01/14 2100) Pulse Rate: 72  (01/14 2100)  Labs:  Basename 04/09/11 2329 04/09/11 0849 04/08/11 0500 04/07/11 0610  HGB -- 8.7* 8.8* --  HCT -- 28.0* 27.9* 27.3*  PLT -- 108* 98* 114*  APTT -- -- -- 33  LABPROT -- 16.2* -- 17.9*  INR -- 1.27 -- 1.45  HEPARINUNFRC <0.10* -- -- --  CREATININE -- -- 1.04 1.04  CKTOTAL -- -- -- --  CKMB -- -- -- --  TROPONINI -- -- -- --   Estimated Creatinine Clearance: 60.2 ml/min (by C-G formula based on Cr of 1.04).   Assessment: 71 yo female with Afib, INR now subtherapeutic, for Heparin  Goal of Therapy:  Heparin level 0.3-0.7   Plan:  Increase Heparin 1500 units/hr Follow-up am labs.  Geannie Risen, PharmD, BCPS

## 2011-04-10 NOTE — Progress Notes (Signed)
Patient ID: [name], female   DOB: Oct 08, 1940, 71 y.o.   MRN: [id number] Subjective: The patient feels well.  No acute events.  Objective: Vital signs in last 24 hours: Temp:  [98 F (36.7 C)-100.2 F (37.9 C)] 98.1 F (36.7 C) (01/15 1222) Pulse Rate:  [72-97] 97  (01/15 1222) Resp:  [20-25] 22  (01/15 1222) BP: (108-147)/(52-92) 108/64 mmHg (01/15 1222) SpO2:  [91 %-98 %] 95 % (01/15 1222) Weight:  [103.5 kg (228 lb 2.8 oz)] 103.5 kg (228 lb 2.8 oz) (01/15 0142) Last BM Date: 04/08/11  Intake/Output from previous day: 01/14 0701 - 01/15 0700 In: 675 [P.O.:360; I.V.:283] Out: -  Intake/Output this shift: Total I/O In: -  Out: 550 [Urine:550]  General appearance: alert and no distress GI: soft, non-tender; bowel sounds normal; no masses,  no organomegaly  Lab Results:  Basename 04/10/11 0837 04/09/11 0849 04/08/11 0500  WBC 5.4 4.3 4.2  HGB 9.0* 8.7* 8.8*  HCT 28.6* 28.0* 27.9*  PLT 114* 108* 98*   BMET  Basename 04/08/11 0500  NA 139  K 3.9  CL 104  CO2 27  GLUCOSE 84  BUN 15  CREATININE 1.04  CALCIUM 8.9   LFT No results found for this basename: PROT,ALBUMIN,AST,ALT,ALKPHOS,BILITOT,BILIDIR,IBILI in the last 72 hours PT/INR  Basename 04/10/11 0837 04/09/11 0849  LABPROT 16.6* 16.2*  INR 1.32 1.27   Hepatitis Panel No results found for this basename: HEPBSAG,HCVAB,HEPAIGM,HEPBIGM in the last 72 hours C-Diff No results found for this basename: CDIFFTOX:3 in the last 72 hours Fecal Lactopherrin No results found for this basename: FECLLACTOFRN in the last 72 hours  Studies/Results: No results found.  Medications:  Scheduled:   . sodium chloride   Intravenous Once  . ferrous sulfate  325 mg Oral TID WC  . furosemide  40 mg Oral Daily  . hydroxychloroquine  200 mg Oral Daily  . metoprolol  50 mg Oral BID  . pantoprazole  40 mg Oral Q1200  . predniSONE  5 mg Oral Q breakfast  . rOPINIRole  0.5 mg Oral BID  . rosuvastatin  10 mg Oral Daily    . warfarin  4 mg Oral ONCE-1800  . warfarin  4 mg Oral ONCE-1800   Continuous:   . heparin 1,750 Units/hr (04/10/11 1047)    Assessment/Plan: 1) GI bleed. 2) Atrial fibrillation   I am unable to localize a bleeding site at this time.  The capsule endoscopy was negative for any abnormalities.  Plan: 1) Monitor HGB and transfuse as necessary. 2) Coumadin per Dr. Sharyn Lull. 3) Signing off.  LOS: 5 days   Talani Brazee D 04/10/2011, 12:37 PM

## 2011-04-10 NOTE — Progress Notes (Signed)
ANTICOAGULATION CONSULT NOTE - Consult  Pharmacy Consult for Heparin Indication: Afib  Allergies  Allergen Reactions  . Codeine     REACTION: nausea  . Codeine Phosphate     REACTION: unspecified    Patient Measurements: Height: 5\' 7"  (170.2 cm) Weight: 228 lb 2.8 oz (103.5 kg) IBW/kg (Calculated) : 61.6    Vital Signs: Temp: 98.2 F (36.8 C) (01/15 1711) Temp src: Oral (01/15 1711) BP: 151/77 mmHg (01/15 1711) Pulse Rate: 71  (01/15 1711)  Labs:  Basename 04/10/11 1958 04/10/11 0837 04/09/11 2329 04/09/11 0849 04/08/11 0500  HGB -- 9.0* -- 8.7* --  HCT -- 28.6* -- 28.0* 27.9*  PLT -- 114* -- 108* 98*  APTT -- -- -- -- --  LABPROT -- 16.6* -- 16.2* --  INR -- 1.32 -- 1.27 --  HEPARINUNFRC 0.36 0.21* <0.10* -- --  CREATININE -- -- -- -- 1.04  CKTOTAL -- -- -- -- --  CKMB -- -- -- -- --  TROPONINI -- -- -- -- --   Estimated Creatinine Clearance: 60.5 ml/min (by C-G formula based on Cr of 1.04).   Assessment: 71 yo female with Afib.  Heparin level 0.36 now in range. Capsule endoscopy unable to localize source of GIB.  Goal of Therapy:  Heparin level 0.3-0.7   Plan:  Continue heparin at 1750 units/hr. Next heparin level in am.   Celedonio Miyamoto, PharmD, BCPS Infectious Disease Pharmacist Pager [phone]

## 2011-04-10 NOTE — Progress Notes (Signed)
Subjective:  Patient denies any chest pain or shortness of breath Tolerating IV heparin okay hemoglobin remained stable Study Results Pending  Objective:  Vital Signs in the last 24 hours: Temp:  [98 F (36.7 C)-100.2 F (37.9 C)] 100.2 F (37.9 C) (01/15 0748) Pulse Rate:  [72-86] 86  (01/15 0748) Resp:  [20-25] 22  (01/15 0748) BP: (133-147)/(52-92) 135/92 mmHg (01/15 0748) SpO2:  [91 %-98 %] 95 % (01/15 0748) Weight:  [102.513 kg (226 lb)-103.5 kg (228 lb 2.8 oz)] 103.5 kg (228 lb 2.8 oz) (01/15 0142)  Intake/Output from previous day: 01/14 0701 - 01/15 0700 In: 675 [P.O.:360; I.V.:283] Out: -  Intake/Output from this shift:    Physical Exam: General appearance: alert and cooperative Neck: no carotid bruit, no JVD and supple, symmetrical, trachea midline Lungs: clear to auscultation bilaterally Heart: irregularly irregular rhythm, S1, S2 normal and Soft systolic murmur noted Abdomen: soft, non-tender; bowel sounds normal; no masses,  no organomegaly Extremities: extremities normal, atraumatic, no cyanosis or edema  Lab Results:  Basename 04/10/11 0837 04/09/11 0849  WBC 5.4 4.3  HGB 9.0* 8.7*  PLT 114* 108*    Basename 04/08/11 0500  NA 139  K 3.9  CL 104  CO2 27  GLUCOSE 84  BUN 15  CREATININE 1.04   No results found for this basename: TROPONINI:2,CK,MB:2 in the last 72 hours Hepatic Function Panel No results found for this basename: PROT,ALBUMIN,AST,ALT,ALKPHOS,BILITOT,BILIDIR,IBILI in the last 72 hours No results found for this basename: CHOL in the last 72 hours No results found for this basename: PROTIME in the last 72 hours  Imaging: No results found.  Cardiac Studies:  Assessment/Plan:  Status post GI bleeding workup in progress Status post Coumadin toxicity CAD status post CABG and aVR Chronic atrial fibrillation Hypertension Hypercholesterolemia Morbid obesity History of rheumatoid arthritis Anemia of chronic disease History of  pleuropericarditis Plan Continue present management Restart Coumadin per pharmacy protocol if okay with GI  LOS: 5 days    Rogerick Baldwin N 04/10/2011, 10:39 AM

## 2011-04-11 ENCOUNTER — Encounter (HOSPITAL_COMMUNITY): Payer: Medicare Other

## 2011-04-11 LAB — CBC
HCT: 29.9 % — ABNORMAL LOW (ref 36.0–46.0)
Hemoglobin: 9.3 g/dL — ABNORMAL LOW (ref 12.0–15.0)
MCV: 87.7 fL (ref 78.0–100.0)
RBC: 3.41 MIL/uL — ABNORMAL LOW (ref 3.87–5.11)
WBC: 6.2 10*3/uL (ref 4.0–10.5)

## 2011-04-11 LAB — GASTRIN: Gastrin: 119 pg/mL — ABNORMAL HIGH (ref <101)

## 2011-04-11 MED ORDER — AMIODARONE HCL 200 MG PO TABS
200.0000 mg | ORAL_TABLET | Freq: Every day | ORAL | Status: DC
  Administered 2011-04-11 – 2011-04-12 (×2): 200 mg via ORAL
  Filled 2011-04-11 (×4): qty 1

## 2011-04-11 MED ORDER — WARFARIN SODIUM 7.5 MG PO TABS
7.5000 mg | ORAL_TABLET | Freq: Once | ORAL | Status: AC
  Administered 2011-04-11: 7.5 mg via ORAL
  Filled 2011-04-11: qty 1

## 2011-04-11 NOTE — Progress Notes (Signed)
Subjective:  Patient denies any chest pain or shortness of breath eager to go home Hemoglobin remained stable tolerating Coumadin and heparin Capsule study was negative Objective:  Vital Signs in the last 24 hours: Temp:  [97.9 F (36.6 C)-99.7 F (37.6 C)] 98.1 F (36.7 C) (01/16 0824) Pulse Rate:  [71-97] 79  (01/16 0930) Resp:  [20-22] 22  (01/16 0824) BP: (108-160)/(64-87) 151/76 mmHg (01/16 0930) SpO2:  [95 %-98 %] 95 % (01/16 0824) Weight:  [104 kg (229 lb 4.5 oz)] 104 kg (229 lb 4.5 oz) (01/16 0100)  Intake/Output from previous day: 01/15 0701 - 01/16 0700 In: 737.5 [P.O.:360; I.V.:377.5] Out: 1150 [Urine:1150] Intake/Output from this shift: Total I/O In: 360 [P.O.:360] Out: -   Physical Exam: Exam essentially unchanged  Lab Results:  Basename 04/11/11 0328 04/10/11 0837  WBC 6.2 5.4  HGB 9.3* 9.0*  PLT 115* 114*   No results found for this basename: NA:2,K:2,CL:2,CO2:2,GLUCOSE:2,BUN:2,CREATININE:2 in the last 72 hours No results found for this basename: TROPONINI:2,CK,MB:2 in the last 72 hours Hepatic Function Panel No results found for this basename: PROT,ALBUMIN,AST,ALT,ALKPHOS,BILITOT,BILIDIR,IBILI in the last 72 hours No results found for this basename: CHOL in the last 72 hours No results found for this basename: PROTIME in the last 72 hours  Imaging:   Cardiac Studies:  Assessment/Plan:  Status post the GI bleed etiology unclear Status post Coumadin toxicity Coronary artery disease status post CABG/aVR Chronic atrial fibrillation Hypertension Hypercholesteremia Anemia of chronic disease Morbid obesity Rheumatoid arthritis History of pleural pericarditis Plan continue present management Restart amiodarone Will DC home once INR above 2   LOS: 6 days    Belvin Gauss N 04/11/2011, 11:47 AM

## 2011-04-11 NOTE — Progress Notes (Signed)
  Pharmacy Consult for Heparin and Warfarin Indication: Afib  Allergies  Allergen Reactions  . Codeine     REACTION: nausea  . Codeine Phosphate     REACTION: unspecified     Vital Signs: Temp: 98.1 F (36.7 C) (01/16 0824) Temp src: Oral (01/16 0824) BP: 155/74 mmHg (01/16 0824) Pulse Rate: 74  (01/16 0824)  Labs:  Basename 04/11/11 0328 04/10/11 1958 04/10/11 0837 04/09/11 0849  HGB 9.3* -- 9.0* --  HCT 29.9* -- 28.6* 28.0*  PLT 115* -- 114* 108*  APTT -- -- -- --  LABPROT 16.7* -- 16.6* 16.2*  INR 1.33 -- 1.32 1.27  HEPARINUNFRC 0.41 0.36 0.21* --  CREATININE -- -- -- --  CKTOTAL -- -- -- --  CKMB -- -- -- --  TROPONINI -- -- -- --   Estimated Creatinine Clearance: 60.7 ml/min (by C-G formula based on Cr of 1.04).   Assessment: 71 yo female with Afib.  Heparin level 0.41, INR 1.33.  Goal of Therapy:  Heparin level 0.3-0.7 INR 2-3   Plan:  Continue Heparin at 1750 units/hr.  Recheck heparin level and CBC in AM. Coumadin 7.5mg  x 1 today Follow-up am labs.  Celedonio Miyamoto, PharmD, BCPS Infectious Disease Pharmacist Pager [phone]

## 2011-04-12 LAB — CBC
Hemoglobin: 8.8 g/dL — ABNORMAL LOW (ref 12.0–15.0)
MCH: 27.1 pg (ref 26.0–34.0)
MCHC: 30.3 g/dL (ref 30.0–36.0)
MCV: 89.2 fL (ref 78.0–100.0)
Platelets: 108 10*3/uL — ABNORMAL LOW (ref 150–400)
RBC: 3.25 MIL/uL — ABNORMAL LOW (ref 3.87–5.11)

## 2011-04-12 MED ORDER — AMIODARONE HCL 200 MG PO TABS
200.0000 mg | ORAL_TABLET | Freq: Two times a day (BID) | ORAL | Status: DC
  Administered 2011-04-12 – 2011-04-15 (×6): 200 mg via ORAL
  Filled 2011-04-12 (×7): qty 1

## 2011-04-12 MED ORDER — WARFARIN SODIUM 5 MG PO TABS
5.0000 mg | ORAL_TABLET | Freq: Once | ORAL | Status: AC
  Administered 2011-04-12: 5 mg via ORAL
  Filled 2011-04-12: qty 1

## 2011-04-12 MED ORDER — HEPARIN SOD (PORCINE) IN D5W 100 UNIT/ML IV SOLN
1900.0000 [IU]/h | INTRAVENOUS | Status: DC
  Administered 2011-04-12 – 2011-04-13 (×4): 1900 [IU]/h via INTRAVENOUS
  Filled 2011-04-12 (×5): qty 250

## 2011-04-12 MED ORDER — WARFARIN SODIUM 7.5 MG PO TABS
7.5000 mg | ORAL_TABLET | Freq: Once | ORAL | Status: DC
  Filled 2011-04-12: qty 1

## 2011-04-12 NOTE — Progress Notes (Signed)
Subjective:  Patient denies any chest pain or shortness of breath Denies abdominal pain nausea or vomiting or diarrhea  Objective:  Vital Signs in the last 24 hours: Temp:  [98.2 F (36.8 C)-99.8 F (37.7 C)] 98.2 F (36.8 C) (01/17 0735) Pulse Rate:  [66-80] 72  (01/17 0935) Resp:  [18-32] 22  (01/17 0735) BP: (138-163)/(67-83) 144/83 mmHg (01/17 0935) SpO2:  [90 %-98 %] 96 % (01/17 0735) Weight:  [103 kg (227 lb 1.2 oz)] 103 kg (227 lb 1.2 oz) (01/17 0500)  Intake/Output from previous day: 01/16 0701 - 01/17 0700 In: 780 [P.O.:360; I.V.:420] Out: -  Intake/Output from this shift: Total I/O In: 30 [P.O.:30] Out: -   Physical Exam: General appearance: alert and cooperative Neck: no carotid bruit, no JVD and supple, symmetrical, trachea midline Lungs: clear to auscultation bilaterally Heart: irregularly irregular rhythm, S1, S2 normal and Soft systolic murmur noted Abdomen: soft, non-tender; bowel sounds normal; no masses,  no organomegaly Extremities: extremities normal, atraumatic, no cyanosis or edema  Lab Results:  Basename 04/12/11 0345 04/11/11 0328  WBC 6.4 6.2  HGB 8.8* 9.3*  PLT 108* 115*   No results found for this basename: NA:2,K:2,CL:2,CO2:2,GLUCOSE:2,BUN:2,CREATININE:2 in the last 72 hours No results found for this basename: TROPONINI:2,CK,MB:2 in the last 72 hours Hepatic Function Panel No results found for this basename: PROT,ALBUMIN,AST,ALT,ALKPHOS,BILITOT,BILIDIR,IBILI in the last 72 hours No results found for this basename: CHOL in the last 72 hours No results found for this basename: PROTIME in the last 72 hours  Imaging: No results found.  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  LOS: 7 days    Clancy Mullarkey N 04/12/2011, 10:19 AM

## 2011-04-12 NOTE — Progress Notes (Addendum)
  Pharmacy Consult for Heparin and Warfarin Indication: Afib  Allergies  Allergen Reactions  . Codeine     REACTION: nausea  . Codeine Phosphate     REACTION: unspecified     Vital Signs: Temp: 98.2 F (36.8 C) (01/17 0735) Temp src: Oral (01/17 0735) BP: 144/83 mmHg (01/17 0935) Pulse Rate: 72  (01/17 0935)  Labs:  Basename 04/12/11 0345 04/11/11 0328 04/10/11 1958 04/10/11 0837  HGB 8.8* 9.3* -- --  HCT 29.0* 29.9* -- 28.6*  PLT 108* 115* -- 114*  APTT -- -- -- --  LABPROT 18.1* 16.7* -- 16.6*  INR 1.47 1.33 -- 1.32  HEPARINUNFRC 0.27* 0.41 0.36 --  CREATININE -- -- -- --  CKTOTAL -- -- -- --  CKMB -- -- -- --  TROPONINI -- -- -- --   Estimated Creatinine Clearance: 60.4 ml/min (by C-G formula based on Cr of 1.04).  Assessment: 70yoF initiated on Coumadin 1 month ago for Afib. Admitted to ED with fatigue, hgb 5.9, and INR >8. Pt received 2 units PRBC, 2 units FFP, and Vit K 5mg  PO. INR Dropped -->1.45 and now 1.32 today after 4mg  daily for 2 days. Home regimen was Coumadin 5mg  daily. INR may be resistant to coumadin for a few days d/t Vitamin K, and 5mg  daily may be too high for pt. Will f/u closely and adjust accordingly. Pt also on SCDs. Hgb trending down 9.3-->9.0-->8.8-->8.7. EGD/colonoscopy performed 01/13, per report no source of bleeding.   HL 0.27 on 1750 untis/hr - INR 1.47  Goal of Therapy:  INR 2.0-3.0, Heparin level 0.3-0.7  Plan:  1. Coumadin 5mg  po x 1 tonight. 2. Daily INR 3. Increase heparin to 1900 uts/hr 4, daily cbc/hep level.  Pharmacist System-Based Medication Review: Infectious Disease: N/A Cardiovascular: CAD s/p CABG, AVR, Afib, HLD. Meds: lopressor, crestor, lasix. BP 144/83, HR 72s.  Endocrinology: On prednisone 5mg  daily. cBGs 84-98 Gastrointestinal / Nutrition: GERD on PO PPI. EGD/Colonscopy showed no source of recent bleeding. GI signed off. Neurology: RLS: Requip Nephrology: SCr, lytes OK.  Pulmonary: room air Hematology /  Oncology: Hx Anemia, RA: On plaquenil. Hgb improved, but now trending down slightly. Plts 108. F/u closely.  PTA Medication Issues Best Practices : Warfarin, iv heparin

## 2011-04-12 NOTE — Progress Notes (Signed)
   CARE MANAGEMENT NOTE 04/12/2011  Patient:  [name],[name]   Account Number:  [id number]  Date Initiated:  04/12/2011  Documentation initiated by:  GRAVES-BIGELOW,Chyler Creely  Subjective/Objective Assessment:   Pt admitted with generalized weakness fatigue. HGB of 5. 9 with elevated INR above 8 as per outpatient lab and patient was advised to come to ED for further evaluation.     Action/Plan:   Pt continues Iv heparin gtt. INR 1.33. Plan to d/c home once INR above 2.   Anticipated DC Date:  04/15/2011   Anticipated DC Plan:  HOME/SELF CARE      DC Planning Services  CM consult      Choice offered to / List presented to:             Status of service:  Completed, signed off Medicare Important Message given?   (If response is "NO", the following Medicare IM given date fields will be blank) Date Medicare IM given:   Date Additional Medicare IM given:    Discharge Disposition:  HOME/SELF CARE  Per UR Regulation:    Comments:  04-12-11 [address] Tomi Bamberger, RN,BSN [phone] CM spoke to pt and she is from home alone. She says she has family support. Per RN pt ambulates well. No home care needs identified at this time. Will continue to monitor for d/c disposition.

## 2011-04-13 ENCOUNTER — Encounter (HOSPITAL_COMMUNITY): Payer: Self-pay | Admitting: Gastroenterology

## 2011-04-13 LAB — TYPE AND SCREEN
ABO/RH(D): O POS
Unit division: 0

## 2011-04-13 LAB — CBC
HCT: 27.4 % — ABNORMAL LOW (ref 36.0–46.0)
MCH: 27.4 pg (ref 26.0–34.0)
MCV: 90.4 fL (ref 78.0–100.0)
RBC: 3.03 MIL/uL — ABNORMAL LOW (ref 3.87–5.11)
WBC: 6.7 10*3/uL (ref 4.0–10.5)

## 2011-04-13 LAB — PROTIME-INR: INR: 1.65 — ABNORMAL HIGH (ref 0.00–1.49)

## 2011-04-13 MED ORDER — WARFARIN SODIUM 7.5 MG PO TABS
7.5000 mg | ORAL_TABLET | Freq: Once | ORAL | Status: AC
  Administered 2011-04-13: 7.5 mg via ORAL
  Filled 2011-04-13: qty 1

## 2011-04-13 MED ORDER — FUROSEMIDE 10 MG/ML IJ SOLN
40.0000 mg | Freq: Once | INTRAMUSCULAR | Status: AC
  Administered 2011-04-13: 40 mg via INTRAVENOUS
  Filled 2011-04-13: qty 4

## 2011-04-13 NOTE — Progress Notes (Signed)
  Pharmacy Consult for Heparin and Warfarin Indication: Afib  Allergies  Allergen Reactions  . Codeine     REACTION: nausea  . Codeine Phosphate     REACTION: unspecified     Vital Signs: Temp: 99.8 F (37.7 C) (01/18 0755) Temp src: Oral (01/18 0755) BP: 142/63 mmHg (01/18 0755) Pulse Rate: 77  (01/18 0755)  Labs:  Basename 04/13/11 0405 04/12/11 0345 04/11/11 0328  HGB 8.3* 8.8* --  HCT 27.4* 29.0* 29.9*  PLT 105* 108* 115*  APTT -- -- --  LABPROT 19.8* 18.1* 16.7*  INR 1.65* 1.47 1.33  HEPARINUNFRC 0.34 0.27* 0.41  CREATININE -- -- --  CKTOTAL -- -- --  CKMB -- -- --  TROPONINI -- -- --   Estimated Creatinine Clearance: 61.3 ml/min (by C-G formula based on Cr of 1.04).  Assessment: 70yoF initiated on Coumadin 1 month ago for Afib. Admitted to ED with fatigue, hgb 5.9, and INR >8. Pt received 2 units PRBC, 2 units FFP, and Vit K 5mg  PO. INR Dropped -->1.45 and now 1.32 today after 4mg  daily for 2 days. Home regimen was Coumadin 5mg  daily. INR may be resistant to coumadin for a few days d/t Vitamin K, and 5mg  daily may be too high for pt. Will f/u closely and adjust accordingly. Pt also on SCDs. Hgb trending down 9.3-->9.0-->8.8-->8.7. EGD/colonoscopy performed 01/13, per report no source of bleeding.   HL 0.34 on 1900 untis/hr - INR 1.65  Goal of Therapy:  INR 2.0-3.0, Heparin level 0.3-0.7  Plan:  1. Coumadin 7.5mg  po x 1 tonight. 2. Daily INR 3. Cont heparin to 1900 uts/hr 4. Daily cbc/hep level.  Pharmacist System-Based Medication Review: Infectious Disease: N/A Cardiovascular: CAD s/p CABG, AVR, Afib, HLD. Meds: lopressor, crestor, lasix. BP 142/, HR 7. Also on Amio so watch closely. Endocrinology: On prednisone 5mg  daily. cBGs ok Gastrointestinal / Nutrition: GERD on PO PPI. EGD/Colonscopy showed no source of recent bleeding. GI signed off. Neurology: RLS: Requip Nephrology: SCr, lytes OK.  Pulmonary: room air Hematology / Oncology: Hx Anemia, RA: On  plaquenil. Hgb improved, but now trending down slightly. Plts 105. F/u closely.  PTA Medication Issues Best Practices : Warfarin, iv heparin

## 2011-04-13 NOTE — Progress Notes (Signed)
Subjective:  Patient denies any abdominal pain or black tarry stool Hemoglobin is slowly trending down GI workup so far negative for source of bleeding Patient denies any chest pain or shortness of breath  Objective:  Vital Signs in the last 24 hours: Temp:  [98.3 F (36.8 C)-100 F (37.8 C)] 99.8 F (37.7 C) (01/18 0755) Pulse Rate:  [71-77] 71  (01/18 1004) Resp:  [20-22] 20  (01/18 0755) BP: (116-147)/(59-79) 146/61 mmHg (01/18 1005) SpO2:  [98 %-100 %] 100 % (01/18 0755) Weight:  [106.2 kg (234 lb 2.1 oz)] 106.2 kg (234 lb 2.1 oz) (01/18 0100)  Intake/Output from previous day: 01/17 0701 - 01/18 0700 In: 479.1 [P.O.:30; I.V.:449.1] Out: 1200 [Urine:1200] Intake/Output from this shift: Total I/O In: 369 [P.O.:350; I.V.:19] Out: 351 [Urine:350; Stool:1]  Physical Exam: General appearance: alert and cooperative Neck: no JVD and supple, symmetrical, trachea midline Lungs: clear to auscultation bilaterally Heart: irregularly irregular rhythm, S1, S2 normal and Soft systolic murmur noted Abdomen: soft, non-tender; bowel sounds normal; no masses,  no organomegaly Extremities: extremities normal, atraumatic, no cyanosis or edema  Lab Results:  Basename 04/13/11 0405 04/12/11 0345  WBC 6.7 6.4  HGB 8.3* 8.8*  PLT 105* 108*   No results found for this basename: NA:2,K:2,CL:2,CO2:2,GLUCOSE:2,BUN:2,CREATININE:2 in the last 72 hours No results found for this basename: TROPONINI:2,CK,MB:2 in the last 72 hours Hepatic Function Panel No results found for this basename: PROT,ALBUMIN,AST,ALT,ALKPHOS,BILITOT,BILIDIR,IBILI in the last 72 hours No results found for this basename: CHOL in the last 72 hours No results found for this basename: PROTIME in the last 72 hours  Imaging: No results found.  Cardiac Studies:  Assessment/Plan:  Status post GI bleeding Status post Coumadin toxicity Coronary artery disease status post CABG/aVR Chronic atrial  fibrillation Hypertension Hypercholesteremia Morbid obesity History of rheumatoid arthritis Anemia of chronic disease History of pleuropericarditis Plan Continue heparin and Coumadin for now Type and cross match and transfuse 2 units of packed RBC May need repeat endoscopy/capsule study discussed with GI  LOS: 8 days    Juwann Sherk N 04/13/2011, 10:37 AM

## 2011-04-14 LAB — CBC
MCH: 28.2 pg (ref 26.0–34.0)
MCHC: 31.5 g/dL (ref 30.0–36.0)
Platelets: 101 10*3/uL — ABNORMAL LOW (ref 150–400)
RDW: 18.9 % — ABNORMAL HIGH (ref 11.5–15.5)

## 2011-04-14 LAB — HEPARIN LEVEL (UNFRACTIONATED): Heparin Unfractionated: 0.5 IU/mL (ref 0.30–0.70)

## 2011-04-14 MED ORDER — WARFARIN SODIUM 4 MG PO TABS
4.0000 mg | ORAL_TABLET | Freq: Once | ORAL | Status: AC
  Administered 2011-04-14: 4 mg via ORAL
  Filled 2011-04-14: qty 1

## 2011-04-14 NOTE — Progress Notes (Signed)
Subjective:  Patient denies any chest pain or shortness of breath No obvious GI bleeding Platelet count trending down INR is close to 2 will DC heparin  Objective:  Vital Signs in the last 24 hours: Temp:  [97.7 F (36.5 C)-100.2 F (37.9 C)] 100.2 F (37.9 C) (01/19 0857) Pulse Rate:  [63-80] 75  (01/19 0955) Resp:  [16-20] 20  (01/19 0857) BP: (122-168)/(60-99) 146/77 mmHg (01/19 0955) SpO2:  [95 %-100 %] 95 % (01/19 0857) Weight:  [105 kg (231 lb 7.7 oz)] 105 kg (231 lb 7.7 oz) (01/19 0019)  Intake/Output from previous day: 01/18 0701 - 01/19 0700 In: 1069 [P.O.:350; I.V.:19; Blood:700] Out: 601 [Urine:600; Stool:1] Intake/Output from this shift: Total I/O In: 240 [P.O.:240] Out: -   Physical Exam: General appearance: alert and cooperative Neck: no carotid bruit, no JVD and supple, symmetrical, trachea midline Lungs: clear to auscultation bilaterally Heart: irregularly irregular rhythm, S1, S2 normal and Soft systolic murmur noted Abdomen: soft, non-tender; bowel sounds normal; no masses,  no organomegaly Extremities: extremities normal, atraumatic, no cyanosis or edema  Lab Results:  Basename 04/14/11 0500 04/13/11 0405  WBC 6.7 6.7  HGB 10.6* 8.3*  PLT 101* 105*   No results found for this basename: NA:2,K:2,CL:2,CO2:2,GLUCOSE:2,BUN:2,CREATININE:2 in the last 72 hours No results found for this basename: TROPONINI:2,CK,MB:2 in the last 72 hours Hepatic Function Panel No results found for this basename: PROT,ALBUMIN,AST,ALT,ALKPHOS,BILITOT,BILIDIR,IBILI in the last 72 hours No results found for this basename: CHOL in the last 72 hours No results found for this basename: PROTIME in the last 72 hours  Imaging: No results found.  Cardiac Studies:  Assessment/Plan:  Status post GI bleeding Status post Coumadin toxicity Coronary artery disease status post CABG/aVR Chronic atrial fibrillation Hypertension Hypercholesteremia Morbid  obesity Thrombocytopenia Rheumatoid arthritis Anemia of chronic disease History of pleural pericarditis Plan DC heparin Continue present management Check CBC PT/INR in a.m. if stable discharge home tomorrow  LOS: 9 days    Zhuri Krass N 04/14/2011, 10:39 AM

## 2011-04-14 NOTE — Progress Notes (Signed)
Pharmacy Consult for Heparin and Warfarin Indication: Afib  Allergies  Allergen Reactions  . Codeine     REACTION: nausea  . Codeine Phosphate     REACTION: unspecified     Vital Signs: Temp: 100.2 F (37.9 C) (01/19 0857) Temp src: Oral (01/19 0857) BP: 146/77 mmHg (01/19 0955) Pulse Rate: 75  (01/19 0955)  Labs:  Basename 04/14/11 0500 04/13/11 0405 04/12/11 0345  HGB 10.6* 8.3* --  HCT 33.6* 27.4* 29.0*  PLT 101* 105* 108*  APTT -- -- --  LABPROT 22.9* 19.8* 18.1*  INR 1.99* 1.65* 1.47  HEPARINUNFRC 0.50 0.34 0.27*  CREATININE -- -- --  CKTOTAL -- -- --  CKMB -- -- --  TROPONINI -- -- --   Estimated Creatinine Clearance: 61 ml/min (by C-G formula based on Cr of 1.04).  Pharmacist System-Based Medication Review: Infectious Disease: N/A Cardiovascular: CAD s/p CABG, AVR, Afib, HLD. Meds: lopressor, crestor, lasix. BP 142/, HR 7. Also on Amio so watch closely. Endocrinology: On prednisone 5mg  daily. cBGs ok Gastrointestinal / Nutrition: GERD on PO PPI. EGD/Colonscopy showed no source of recent bleeding. GI signed off. Neurology: RLS: Requip Nephrology: SCr, lytes OK.  Pulmonary: room air Hematology / Oncology: Hx Anemia, RA: On plaquenil. Hgb improved, but now trending down slightly. Plts 105. F/u closely.  PTA Medication Issues Best Practices : Warfarin, iv heparin   Anticoag Assessment: 70yoF initiated on Coumadin 1 month ago for Afib. Admitted to ED with fatigue, hgb 5.9, and INR >8. Pt received 2 units PRBC, 2 units FFP, and Vit K 5mg  PO. Home regimen was Coumadin 5mg  daily - may be too much for her. Patient's daily coumadin needs will decrease over the next few months if continued on current dose of amiodarone, increased from home dose. Will f/u closely and adjust accordingly. Pt also on SCDs. Hgb has been trending down 9.3-->9.0-->8.8-->8.7. Hgb today 10.6 after PRBC infusion yesterday. EGD/colonoscopy performed 01/13, per report no source of bleeding.   HL  0.50 - at goal on 1900 untis/hr  INR 1.99<--1.65. A big jump.  Goal of Therapy:  INR 2.0-3.0, Heparin level 0.3-0.7  Plan:  1. Coumadin 4mg  po x 1 tonight. 3. Cont heparin to 1900 uts/hr 4. Will follow up with AM labs.  Zoe Lan, PharmD pgr [phone] 04/14/2011, 10:21 AM

## 2011-04-15 LAB — CBC
MCH: 28.9 pg (ref 26.0–34.0)
MCV: 91.1 fL (ref 78.0–100.0)
Platelets: 97 10*3/uL — ABNORMAL LOW (ref 150–400)
RBC: 3.5 MIL/uL — ABNORMAL LOW (ref 3.87–5.11)
RDW: 19.3 % — ABNORMAL HIGH (ref 11.5–15.5)

## 2011-04-15 MED ORDER — AMIODARONE HCL 200 MG PO TABS: 200.0000 mg | ORAL_TABLET | Freq: Two times a day (BID) | ORAL | Status: DC

## 2011-04-15 MED ORDER — WARFARIN SODIUM 3 MG PO TABS: 3.0000 mg | ORAL_TABLET | Freq: Every day | ORAL | Status: DC

## 2011-04-15 NOTE — Discharge Summary (Signed)
  Discharge summary dictated on 04/15/2011 dictation number is [phone]

## 2011-04-15 NOTE — ED Provider Notes (Signed)
History     CSN: [id number]  Arrival date & time 04/05/11  2345   First MD Initiated Contact with Patient 04/06/11 0143      Chief Complaint  Patient presents with  . Anemia     HPI The pt has a low hgb. She saw her doctor yesterday and today and blood was drawn and she was called tonight and told to come to the ed. She is on coumadin. Dark stools for a few days.. She has stopped taking coumadin for a few days  Past Medical History  Diagnosis Date  . Coronary artery disease   . Atrial fibrillation     Now NSR  . GERD (gastroesophageal reflux disease)   . Hyperlipidemia   . 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 Anemia   . Angina   . Shortness of breath     with activity  . Hypertension   . Blood transfusion   . Pneumonia   . H/O hiatal hernia   . Lupus     Past Surgical History  Procedure Date  . Cardiac catheterization   . Cardiac valve replacement     2006  . Coronary artery bypass graft   . Givens capsule study 04/09/2011    Procedure: GIVENS CAPSULE STUDY;  Surgeon: Theda Belfast, MD;  Location: [facility] ENDOSCOPY;  Service: Endoscopy;  Laterality: N/A;    History reviewed. No pertinent family history.  History  Substance Use Topics  . Smoking status: Never Smoker   . Smokeless tobacco: Never Used  . Alcohol Use: No    OB History    Grav Para Term Preterm Abortions TAB SAB Ect Mult Living                  Review of Systems  All other systems reviewed and are negative.    Allergies  Codeine and Codeine phosphate  Home Medications  No current outpatient prescriptions on file.  BP 150/91  Pulse 77  Temp(Src) 99.1 F (37.3 C) (Oral)  Resp 20  Ht 5\' 7"  (1.702 m)  Wt 229 lb 4.5 oz (104 kg)  BMI 35.91 kg/m2  SpO2 88%  Physical Exam  Nursing note and vitals reviewed. Constitutional: She is oriented to person, place, and time. She appears well-developed and well-nourished. No distress.  HENT:  Head: Normocephalic and  atraumatic.  Eyes: Pupils are equal, round, and reactive to light.  Neck: Normal range of motion.  Cardiovascular: Normal rate and intact distal pulses.   Pulmonary/Chest: No respiratory distress.  Abdominal: Soft. Normal appearance. She exhibits no distension. There is no tenderness. There is no rebound and no guarding.       Bedside guiac pos for blood.  Musculoskeletal: Normal range of motion.  Neurological: She is alert and oriented to person, place, and time. No cranial nerve deficit.  Skin: Skin is warm and dry. No rash noted.  Psychiatric: She has a normal mood and affect. Her behavior is normal.    ED Course  Procedures (including critical care time)  Labs Reviewed  CBC - Abnormal; Notable for the following:    RBC 2.12 (*)    Hemoglobin 6.0 (*)    HCT 19.1 (*)    RDW 19.0 (*)    All other components within normal limits  DIFFERENTIAL - Abnormal; Notable for the following:    Monocytes Relative 13 (*)    All other components within normal limits  PROTIME-INR - Abnormal; Notable  for the following:    Prothrombin Time 63.0 (*) REPEATED TO VERIFY   INR 7.25 (*)    All other components within normal limits  APTT - Abnormal; Notable for the following:    aPTT 61 (*)    All other components within normal limits  COMPREHENSIVE METABOLIC PANEL - Abnormal; Notable for the following:    Potassium 3.1 (*)    Glucose, Bld 111 (*)    BUN 29 (*)    Creatinine, Ser 1.48 (*)    Albumin 3.0 (*)    Total Bilirubin 0.2 (*)    GFR calc non Af Amer 34 (*)    GFR calc Af Amer 40 (*)    All other components within normal limits  COMPREHENSIVE METABOLIC PANEL - Abnormal; Notable for the following:    BUN 27 (*)    Creatinine, Ser 1.24 (*)    Total Protein 5.9 (*)    Albumin 2.8 (*)    GFR calc non Af Amer 42 (*)    GFR calc Af Amer 49 (*)    All other components within normal limits  CBC - Abnormal; Notable for the following:    WBC 3.7 (*)    RBC 2.61 (*)    Hemoglobin 7.3 (*)     HCT 22.5 (*)    RDW 18.1 (*)    Platelets 131 (*) DELTA CHECK NOTED   All other components within normal limits  PROTIME-INR - Abnormal; Notable for the following:    Prothrombin Time 41.2 (*)    INR 4.21 (*)    All other components within normal limits  COMPREHENSIVE METABOLIC PANEL - Abnormal; Notable for the following:    Potassium 3.4 (*)    Albumin 3.2 (*)    GFR calc non Af Amer 52 (*)    GFR calc Af Amer 61 (*)    All other components within normal limits  CBC - Abnormal; Notable for the following:    WBC 3.7 (*) WHITE COUNT CONFIRMED ON SMEAR   RBC 3.19 (*)    Hemoglobin 9.0 (*)    HCT 27.3 (*)    RDW 18.7 (*)    Platelets 114 (*) PLATELET COUNT CONFIRMED BY SMEAR   All other components within normal limits  PROTIME-INR - Abnormal; Notable for the following:    Prothrombin Time 17.9 (*)    All other components within normal limits  CBC - Abnormal; Notable for the following:    WBC 3.7 (*)    RBC 3.33 (*)    Hemoglobin 9.3 (*)    HCT 28.1 (*)    RDW 18.1 (*)    Platelets 122 (*)    All other components within normal limits  CBC - Abnormal; Notable for the following:    RBC 3.22 (*)    Hemoglobin 8.8 (*)    HCT 27.9 (*)    RDW 18.4 (*)    Platelets 98 (*) CONSISTENT WITH PREVIOUS RESULT   All other components within normal limits  BASIC METABOLIC PANEL - Abnormal; Notable for the following:    GFR calc non Af Amer 52 (*)    GFR calc Af Amer 61 (*)    All other components within normal limits  GASTRIN - Abnormal; Notable for the following:    Gastrin 119 (*)    All other components within normal limits  CBC - Abnormal; Notable for the following:    RBC 3.19 (*)    Hemoglobin 8.7 (*)    HCT  28.0 (*)    RDW 18.3 (*)    Platelets 108 (*) CONSISTENT WITH PREVIOUS RESULT   All other components within normal limits  PROTIME-INR - Abnormal; Notable for the following:    Prothrombin Time 16.2 (*)    All other components within normal limits  HEPARIN LEVEL  (UNFRACTIONATED) - Abnormal; Notable for the following:    Heparin Unfractionated <0.10 (*)    All other components within normal limits  CBC - Abnormal; Notable for the following:    RBC 3.25 (*)    Hemoglobin 9.0 (*)    HCT 28.6 (*)    RDW 17.9 (*)    Platelets 114 (*) CONSISTENT WITH PREVIOUS RESULT   All other components within normal limits  HEPARIN LEVEL (UNFRACTIONATED) - Abnormal; Notable for the following:    Heparin Unfractionated 0.21 (*)    All other components within normal limits  PROTIME-INR - Abnormal; Notable for the following:    Prothrombin Time 16.6 (*)    All other components within normal limits  CBC - Abnormal; Notable for the following:    RBC 3.41 (*)    Hemoglobin 9.3 (*)    HCT 29.9 (*)    RDW 18.2 (*)    Platelets 115 (*) CONSISTENT WITH PREVIOUS RESULT   All other components within normal limits  PROTIME-INR - Abnormal; Notable for the following:    Prothrombin Time 16.7 (*)    All other components within normal limits  CBC - Abnormal; Notable for the following:    RBC 3.25 (*)    Hemoglobin 8.8 (*)    HCT 29.0 (*)    RDW 18.7 (*)    Platelets 108 (*) CONSISTENT WITH PREVIOUS RESULT   All other components within normal limits  HEPARIN LEVEL (UNFRACTIONATED) - Abnormal; Notable for the following:    Heparin Unfractionated 0.27 (*)    All other components within normal limits  PROTIME-INR - Abnormal; Notable for the following:    Prothrombin Time 18.1 (*)    All other components within normal limits  CBC - Abnormal; Notable for the following:    RBC 3.03 (*)    Hemoglobin 8.3 (*)    HCT 27.4 (*)    RDW 19.2 (*)    Platelets 105 (*) CONSISTENT WITH PREVIOUS RESULT   All other components within normal limits  PROTIME-INR - Abnormal; Notable for the following:    Prothrombin Time 19.8 (*)    INR 1.65 (*)    All other components within normal limits  CBC - Abnormal; Notable for the following:    RBC 3.76 (*)    Hemoglobin 10.6 (*) POST  TRANSFUSION SPECIMEN   HCT 33.6 (*)    RDW 18.9 (*)    Platelets 101 (*) CONSISTENT WITH PREVIOUS RESULT   All other components within normal limits  PROTIME-INR - Abnormal; Notable for the following:    Prothrombin Time 22.9 (*)    INR 1.99 (*)    All other components within normal limits  CBC - Abnormal; Notable for the following:    RBC 3.50 (*)    Hemoglobin 10.1 (*)    HCT 31.9 (*)    RDW 19.3 (*)    Platelets 97 (*) CONSISTENT WITH PREVIOUS RESULT   All other components within normal limits  PROTIME-INR - Abnormal; Notable for the following:    Prothrombin Time 24.7 (*)    INR 2.19 (*)    All other components within normal limits  SAMPLE TO BLOOD BANK  PREPARE RBC (CROSSMATCH)  TYPE AND SCREEN  ABO/RH  MRSA PCR SCREENING  MAGNESIUM  TSH  PREPARE RBC (CROSSMATCH)  PREPARE FRESH FROZEN PLASMA  APTT  VITAMIN B12  HEPARIN LEVEL (UNFRACTIONATED)  HEPARIN LEVEL (UNFRACTIONATED)  HEPARIN LEVEL (UNFRACTIONATED)  TYPE AND SCREEN  PREPARE RBC (CROSSMATCH)  HEPARIN LEVEL (UNFRACTIONATED)  OCCULT BLOOD X 1 CARD TO LAB, STOOL  POCT OCCULT BLOOD STOOL, DEVICE   Vitamin K and PRBC ordered in ED.  Dr. Sharyn Lull consulted.  Temp orders written. CRITICAL CARE Performed by: Nelia Shi   Total critical care time:30 minutes  Critical care time was exclusive of separately billable procedures and treating other patients.  Critical care was necessary to treat or prevent imminent or life-threatening deterioration.  Critical care was time spent personally by me on the following activities: development of treatment plan with patient and/or surrogate as well as nursing, discussions with consultants, evaluation of patient's response to treatment, examination of patient, obtaining history from patient or surrogate, ordering and performing treatments and interventions, ordering and review of laboratory studies, ordering and review of radiographic studies, pulse oximetry and re-evaluation  of patient's condition.   1  Supratherapeutic INR 2.  Anemia 2nd blood loss 3.  GI bleeding     MDM          Nelia Shi, MD 04/15/11 [phone]

## 2011-04-15 NOTE — Discharge Summary (Signed)
[name], [name]               ACCOUNT NO.:  [id number]  MEDICAL RECORD NO.:  [id number]  LOCATION:  2610                         FACILITY:  [facility]  PHYSICIAN:  Eduardo Osier. Sharyn Lull, M.D. DATE OF BIRTH:  Aug 15, 1940  DATE OF ADMISSION:  04/05/2011 DATE OF DISCHARGE:  04/15/2011                              DISCHARGE SUMMARY   ADMITTING DIAGNOSES: 1. Acute anemia, rule out gastrointestinal bleeding. 2. Coumadin toxicity. 3. Coronary artery disease status post coronary artery bypass     grafting, status post aortic valve replacement in the past. 4. Chronic atrial fibrillation. 5. Hypertension. 6. Hypercholesteremia. 7. Morbid obesity. 8. History of rheumatoid arthritis. 9. History of anemia of chronic disease. 10.History of pleural pericarditis in the past.  DISCHARGE DIAGNOSES: 1. Status post acute gastrointestinal bleeding, etiology unclear.     Negative esophagogastroduodenoscopy, colonoscopy, and capsule     study. 2. Status post Coumadin toxicity. 3. Coronary artery disease status post coronary artery bypass grafting     and bioprosthetic aortic valve in the past. 4. Chronic atrial fibrillation. 5. Hypercholesteremia. 6. Chronic atrial fibrillation. 7. Hypertension. 8. Hypercholesteremia. 9. Morbid obesity. 10.History of rheumatoid arthritis. 11.Anemia of chronic disease. 12.History of pleural pericarditis in the past.  BRIEF HISTORY AND HOSPITAL COURSE:  Ms. Gordy is a 71 year old female with past medical history significant for coronary artery disease, history of aortic stenosis status post CABG and bioprosthetic aortic valve, hypertension, chronic atrial fibrillation, hypercholesteremia, morbid obesity, history of rheumatoid arthritis, anemia of chronic disease, history of pleural pericarditis in the past, was seen in my office on January 10 because of fatigue, feeling tired associated with generalized weakness.  The patient had outpatient labs done and was noted  to have hemoglobin of 5.9 with elevated INR of 8.  The patient was advised to come to the ED for further evaluation and treatment.  The patient denies any chest pain, nausea, vomiting, diaphoresis.  Denies any shortness of breath.  Denies any palpitation, lightheadedness, or syncope.  Denies any abdominal pain.  Denies any black tarry stool or hematuria or hematemesis.  Denies any bright red blood per rectum. Denies hemoptysis.  Repeat hemoglobin done in the ER was 6.0.  The patient denies any recent NSAID abuse.  PAST MEDICAL HISTORY:  As above.  PAST SURGICAL HISTORY:  He had cardiac cath in the past.  Had aortic valve replacement and CABG in 2006.  SOCIAL HISTORY:  No history of smoking or alcohol abuse.  PHYSICAL EXAMINATION:  GENERAL:  She was alert, awake, oriented x3 in no acute distress. VITAL SIGNS:  Blood pressure was 128/61, pulse was 81, AFib on the monitor.  She was afebrile. HEENT:  Conjunctiva was pale.  Sclerae was nonicteric. NECK:  Supple.  No JVD.  No bruit. LUNGS:  Clear to auscultation without rhonchi or rales. CARDIOVASCULAR:  Irregularly irregular.  S1 and S2 was soft.  There was soft systolic murmur.  There was no S3 gallop. ABDOMEN:  Soft.  Bowel sounds are present.  Nontender. EXTREMITIES:  There was no clubbing, cyanosis, or edema.  LABORATORY DATA:  Her sodium was 139, potassium 3.1, BUN 29, creatinine 1.48.  Her hemoglobin was 6, hematocrit 19.1, white count of  4.4. Repeat hemoglobin post transfusion was 9.0, hematocrit 27.3.  On April 13, 2011, hemoglobin was further trending down to 8.3, hematocrit 27.4. The patient did receive 2 units of packed RBCs, post transfusion today hemoglobin is 10.1, hematocrit 31.9, white count of 4.7.  Repeat electrolytes; sodium was 139, potassium 3.9, BUN 15, creatinine 1.04. Her INR was 7.25.  The patient did receive vitamin K and FFP.  Her INR came down to 4.21.  Repeat INR was 1.45.  GI consultation was  obtained with Dr. Elnoria Howard.  The patient subsequently underwent upper endoscopy and colonoscopy, which did not reveal any source of acute GI bleeding.  The patient subsequently underwent capsule study, which was also negative. The patient was restarted on heparin and Coumadin per pharmacy protocol. Her INR today is 2.19.  Her Coumadin dose has been reduced.  There is no evidence of active bleeding.  The patient has been ambulating in the room without any problems.  The patient will be discharged home on above medications.  DISCHARGE MEDICATIONS: 1. Amiodarone 200 mg 1 tablet twice daily. 2. Warfarin 3 mg 1 tablet daily. 3. Benicar HCT. 4. Ferrous sulfate 325 mg with breakfast. 5. Lasix 40 mg daily. 6. Hydroxychloroquine 200 mg twice daily. 7. Potassium chloride 20 mEq daily. 8. Methotrexate 2.5 mg weekly as before. 9. Sulfasalazine 500 mg 2 tablets twice daily as before. 10.Crestor 10 mg 1 tablet daily. 11.Requip 1 mg daily. 12.Prednisone 5 mg 1 tablet daily as before. 13.Enteric-coated aspirin 81 mg 1 tablet daily. 14.Protonix 40 mg 1 tablet twice daily as before. 15.Metoprolol 50 mg 1 tablet twice daily.  FOLLOWUP:  Follow up with me in 1 week.  CONDITION ON DISCHARGE:  Stable.  DISCHARGE INSTRUCTIONS:  The patient has been advised to call 911 if she notices any black tarry stool or feels dizzy, lightheaded, week.  We will follow her INR in the next 4-5 days as an outpatient and her hemoglobin.     Eduardo Osier. Sharyn Lull, M.D.     [facility]  D:  04/15/2011  T:  04/15/2011  Job:  [id number]

## 2011-04-21 ENCOUNTER — Other Ambulatory Visit: Payer: Self-pay | Admitting: Cardiology

## 2011-05-02 ENCOUNTER — Ambulatory Visit (INDEPENDENT_AMBULATORY_CARE_PROVIDER_SITE_OTHER): Payer: Medicare Other | Admitting: Critical Care Medicine

## 2011-05-02 ENCOUNTER — Encounter: Payer: Self-pay | Admitting: Critical Care Medicine

## 2011-05-02 VITALS — BP 158/82 | HR 79 | Temp 98.0°F | Ht 67.0 in | Wt 226.6 lb

## 2011-05-02 DIAGNOSIS — J8410 Pulmonary fibrosis, unspecified: Secondary | ICD-10-CM

## 2011-05-02 NOTE — Assessment & Plan Note (Signed)
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

## 2011-05-02 NOTE — Patient Instructions (Signed)
Obtain full pulmonary function tests Obtain CT chest no contrast No change in medications Return 4 months, I will call sooner with test results

## 2011-05-02 NOTE — Progress Notes (Signed)
Subjective:    Patient ID: [name], female    DOB: 12-Aug-1940, 71 y.o.   MRN: [id number]  HPI  71 y.o.  white female history of mixed connected tissue disease, pulmonary fibrosis, pneumonitis,   7/11//2012 Still wheezing and tightness.   Dry cough .  No chest pain .  If takes a deep breath will hurt.    02/06/11 Acute OV  Complains of prod cough with green mucus, increased SOB x2weeks,  OTC not working .  Cough is getting worse.  Currently on prednisone 5mg  daily  No hemopytsis , chest pain, edema or recent travel/abx. Used    05/02/2011 Since last ov in 1/13 admitted for afib and blood transfusions.    D/C summary as below: 1. Acute anemia, rule out gastrointestinal bleeding.  2. Coumadin toxicity.  3. Coronary artery disease status post coronary artery bypass  grafting, status post aortic valve replacement in the past.  4. Chronic atrial fibrillation.  5. Hypertension.  6. Hypercholesteremia.  7. Morbid obesity.  8. History of rheumatoid arthritis.  9. History of anemia of chronic disease.  10.History of pleural pericarditis in the past.  DISCHARGE DIAGNOSES:  1. Status post acute gastrointestinal bleeding, etiology unclear.  Negative esophagogastroduodenoscopy, colonoscopy, and capsule  study.  2. Status post Coumadin toxicity.  3. Coronary artery disease status post coronary artery bypass grafting  and bioprosthetic aortic valve in the past.  4. Chronic atrial fibrillation.  5. Hypercholesteremia.  6. Chronic atrial fibrillation.  7. Hypertension.  8. Hypercholesteremia.  9. Morbid obesity.  10.History of rheumatoid arthritis.  11.Anemia of chronic disease.  12.History of pleural pericarditis in the past.   BRIEF HISTORY AND HOSPITAL COURSE: [name] is a 71 year old female  with past medical history significant for coronary artery disease,  history of aortic stenosis status post CABG and bioprosthetic aortic  valve, hypertension, chronic atrial  fibrillation, hypercholesteremia,  morbid obesity, history of rheumatoid arthritis, anemia of chronic  disease, history of pleural pericarditis in the past, was seen in my  office on January 10 because of fatigue, feeling tired associated with  generalized weakness. The patient had outpatient labs done and was  noted to have hemoglobin of 5.9 with elevated INR of 8. The patient was  advised to come to the ED for further evaluation and treatment. The  patient denies any chest pain, nausea, vomiting, diaphoresis. Denies  any shortness of breath. Denies any palpitation, lightheadedness, or  syncope. Denies any abdominal pain. Denies any black tarry stool or  hematuria or hematemesis. Denies any bright red blood per rectum.  Denies hemoptysis. Repeat hemoglobin done in the ER was 6.0. The  patient denies any recent NSAID abuse.   Since d/c is much better. HR better.  INR better. Hgb better.  Now no real cough.  No real chest pain. Now on amiodarone daily for afib. Pt denies any significant sore throat, nasal congestion or excess secretions, fever, chills, sweats, unintended weight loss, pleurtic or exertional chest pain, orthopnea PND, or leg swelling Pt denies any increase in rescue therapy over baseline, denies waking up needing it or having any early am or nocturnal exacerbations of coughing/wheezing/or dyspnea. Pt also denies any obvious fluctuation in symptoms with  weather or environmental change or other alleviating or aggravating factors    Past Medical History  Diagnosis Date  . Coronary artery disease   . Atrial fibrillation     Now NSR  . GERD (gastroesophageal reflux disease)   . Hyperlipidemia   .  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 Anemia   . Angina   . Shortness of breath     with activity  . Hypertension   . Blood transfusion   . Pneumonia   . H/O hiatal hernia   . Lupus      No family history on file.   History   Social History  .  Marital Status: Divorced    Spouse Name: N/A    Number of Children: N/A  . Years of Education: N/A   Occupational History  . Not on file.   Social History Main Topics  . Smoking status: Never Smoker   . Smokeless tobacco: Never Used  . Alcohol Use: No  . Drug Use: Not on file  . Sexually Active: No   Other Topics Concern  . Not on file   Social History Narrative  . No narrative on file     Allergies  Allergen Reactions  . Codeine     REACTION: nausea  . Codeine Phosphate     REACTION: unspecified     Outpatient Prescriptions Prior to Visit  Medication Sig Dispense Refill  . amiodarone (PACERONE) 200 MG tablet Take 1 tablet (200 mg total) by mouth 2 (two) times daily.  60 tablet  3  . Belimumab (BENLYSTA IV) Inject into the vein. Once monthly       . BENICAR HCT 40-12.5 MG per tablet Once daily      . ferrous sulfate 325 (65 FE) MG tablet Take 325 mg by mouth daily with breakfast.      . furosemide (LASIX) 40 MG tablet Once daily      . hydroxychloroquine (PLAQUENIL) 200 MG tablet Twice daily      . KLOR-CON M20 20 MEQ tablet Once daily      . methotrexate (RHEUMATREX) 2.5 MG tablet Take 12.5 mg by mouth once a week. On Saturdays      . metoprolol (LOPRESSOR) 50 MG tablet Take 50 mg by mouth 2 (two) times daily.        . pantoprazole (PROTONIX) 40 MG tablet TAKE 1 TABLET BY MOUTH ONCE DAILY  90 tablet  1  . predniSONE (DELTASONE) 5 MG tablet TAKE 1 TABLET BY MOUTH EVERY DAY  60 tablet  3  . rOPINIRole (REQUIP) 1 MG tablet Daily at bedtime      . rosuvastatin (CRESTOR) 10 MG tablet Take 10 mg by mouth daily.        Marland Kitchen sulfaSALAzine (AZULFIDINE) 500 MG tablet Take 1,000 mg by mouth 2 (two) times daily. 2 tablets twice daily      . warfarin (COUMADIN) 3 MG tablet Take 1 tablet (3 mg total) by mouth daily.  35 tablet  3      Review of Systems  Constitutional:   No  weight loss, night sweats,  Fevers, chills, fatigue, lassitude. HEENT:   No headaches,  Difficulty  swallowing,  Tooth/dental problems,  Sore throat,                No sneezing, itching, ear ache, nasal congestion, + post nasal drip,   CV:  No chest pain,  Orthopnea, PND, swelling in lower extremities, anasarca, dizziness, palpitations  GI  No heartburn, indigestion, abdominal pain, nausea, vomiting, diarrhea, change in bowel habits, loss of appetite  Resp: Notes  shortness of breath with exertion not at rest.  No coughing up of blood.  No chest wall deformity  Skin: no  rash or lesions.  GU: no dysuria, change in color of urine, no urgency or frequency.  No flank pain.  MS:  No joint pain or swelling.  No decreased range of motion.  No back pain.  Psych:  No change in mood or affect. No depression or anxiety.  No memory loss.     Objective:   Physical Exam  Filed Vitals:   05/02/11 1403  BP: 158/82  Pulse: 79  Temp: 98 F (36.7 C)  TempSrc: Oral  Height: 5\' 7"  (1.702 m)  Weight: 226 lb 9.6 oz (102.785 kg)  SpO2: 97%    Gen: Pleasant, well-nourished, in no distress,  normal affect  ENT: No lesions,  mouth clear,  oropharynx clear, no postnasal drip  Neck: No JVD, no TMG, no carotid bruits  Lungs: No use of accessory muscles, no dullness to percussion,dry rales Coarse BS bilaterally   Cardiovascular: RRR, heart sounds normal, no murmur or gallops, no peripheral edema  Abdomen: soft and NT, no HSM,  BS normal  Musculoskeletal: No deformities, no cyanosis or clubbing  Neuro: alert, non focal  Skin: Warm, no lesions or rashes        Assessment & Plan:   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 amiodarone (PACERONE) 200 MG tablet Take 1 tablet (200 mg total) by mouth 2 (two) times daily.  60 tablet  3  . Belimumab (BENLYSTA IV) Inject into the vein. Once monthly       . BENICAR HCT 40-12.5 MG per tablet Once daily      . ferrous sulfate 325 (65 FE) MG tablet Take 325 mg by mouth daily with breakfast.      . furosemide (LASIX) 40 MG tablet Once daily      . hydroxychloroquine (PLAQUENIL) 200 MG tablet Twice daily      . KLOR-CON M20 20 MEQ tablet Once daily      . methotrexate (RHEUMATREX) 2.5 MG tablet Take 12.5 mg by mouth once a week. On Saturdays      . metoprolol (LOPRESSOR) 50 MG tablet Take 50 mg by mouth 2 (two) times daily.        . pantoprazole (PROTONIX) 40 MG tablet TAKE 1 TABLET BY MOUTH ONCE DAILY  90 tablet  1  . predniSONE (DELTASONE) 5 MG tablet TAKE 1 TABLET BY MOUTH EVERY DAY  60 tablet  3  . rOPINIRole (REQUIP) 1 MG tablet Daily at bedtime      . rosuvastatin (CRESTOR) 10 MG tablet Take 10 mg by mouth daily.        Marland Kitchen sulfaSALAzine (AZULFIDINE) 500 MG tablet Take 1,000 mg by mouth 2 (two) times daily. 2 tablets twice daily      . warfarin (COUMADIN) 3 MG tablet Take 1 tablet (3 mg total) by mouth daily.  35 tablet  3         Updated Medication List Outpatient Encounter Prescriptions as of 05/02/2011  Medication Sig Dispense Refill  . amiodarone (PACERONE) 200 MG tablet Take 1  tablet (200 mg total) by mouth 2 (two) times daily.  60 tablet  3  . Belimumab (BENLYSTA IV) Inject into the vein. Once monthly       . BENICAR HCT 40-12.5 MG per tablet Once daily      . ferrous sulfate 325 (65 FE) MG tablet Take 325 mg by mouth daily with breakfast.      . furosemide (LASIX) 40 MG tablet Once daily      . hydroxychloroquine (PLAQUENIL) 200 MG tablet Twice daily      . KLOR-CON  M20 20 MEQ tablet Once daily      . methotrexate (RHEUMATREX) 2.5 MG tablet Take 12.5 mg by mouth once a week. On Saturdays      . metoprolol (LOPRESSOR) 50 MG tablet Take 50 mg by mouth 2 (two) times daily.        . pantoprazole (PROTONIX) 40 MG tablet TAKE 1 TABLET BY MOUTH ONCE DAILY  90 tablet  1  . predniSONE (DELTASONE) 5 MG tablet TAKE 1 TABLET BY MOUTH EVERY DAY  60 tablet  3  . rOPINIRole (REQUIP) 1 MG tablet Daily at bedtime      . rosuvastatin (CRESTOR) 10 MG tablet Take 10 mg by mouth daily.        Marland Kitchen sulfaSALAzine (AZULFIDINE) 500 MG tablet Take 1,000 mg by mouth 2 (two) times daily. 2 tablets twice daily      . warfarin (COUMADIN) 3 MG tablet Take 1 tablet (3 mg total) by mouth daily.  35 tablet  3

## 2011-05-03 ENCOUNTER — Other Ambulatory Visit: Payer: Self-pay | Admitting: Critical Care Medicine

## 2011-05-04 ENCOUNTER — Ambulatory Visit (INDEPENDENT_AMBULATORY_CARE_PROVIDER_SITE_OTHER)
Admission: RE | Admit: 2011-05-04 | Discharge: 2011-05-04 | Disposition: A | Discharge status: Home / Self Care | Payer: Medicare Other | Source: Ambulatory Visit | Attending: Internal Medicine | Admitting: Internal Medicine

## 2011-05-04 DIAGNOSIS — J8410 Pulmonary fibrosis, unspecified: Secondary | ICD-10-CM

## 2011-05-08 ENCOUNTER — Other Ambulatory Visit (HOSPITAL_COMMUNITY): Payer: Self-pay | Admitting: *Deleted

## 2011-05-09 ENCOUNTER — Encounter (HOSPITAL_COMMUNITY)
Admission: RE | Admit: 2011-05-09 | Discharge: 2011-05-09 | Disposition: A | Discharge status: Home / Self Care | Payer: Medicare Other | Source: Ambulatory Visit | Attending: Rheumatology | Admitting: Rheumatology

## 2011-05-09 DIAGNOSIS — M329 Systemic lupus erythematosus, unspecified: Secondary | ICD-10-CM | POA: Insufficient documentation

## 2011-05-09 MED ORDER — ACETAMINOPHEN 325 MG PO TABS
650.0000 mg | ORAL_TABLET | ORAL | Status: DC
  Administered 2011-05-09: 650 mg via ORAL
  Filled 2011-05-09: qty 2

## 2011-05-09 MED ORDER — SODIUM CHLORIDE 0.9 % IV SOLN
1000.0000 mg | INTRAVENOUS | Status: DC
  Administered 2011-05-09: 1000 mg via INTRAVENOUS
  Filled 2011-05-09: qty 12.5

## 2011-05-17 ENCOUNTER — Other Ambulatory Visit: Payer: Self-pay | Admitting: Critical Care Medicine

## 2011-06-04 ENCOUNTER — Other Ambulatory Visit (HOSPITAL_COMMUNITY): Payer: Self-pay | Admitting: *Deleted

## 2011-06-06 ENCOUNTER — Encounter (HOSPITAL_COMMUNITY)
Admission: RE | Admit: 2011-06-06 | Discharge: 2011-06-06 | Disposition: A | Discharge status: Home / Self Care | Payer: Medicare Other | Source: Ambulatory Visit | Attending: Rheumatology | Admitting: Rheumatology

## 2011-06-06 DIAGNOSIS — M329 Systemic lupus erythematosus, unspecified: Secondary | ICD-10-CM | POA: Insufficient documentation

## 2011-06-06 MED ORDER — BELIMUMAB 120 MG IV SOLR
1000.0000 mg | INTRAVENOUS | Status: DC
  Administered 2011-06-06: 1000 mg via INTRAVENOUS
  Filled 2011-06-06: qty 12.5

## 2011-06-06 MED ORDER — ACETAMINOPHEN 325 MG PO TABS
650.0000 mg | ORAL_TABLET | ORAL | Status: DC
  Administered 2011-06-06: 650 mg via ORAL

## 2011-06-06 MED ORDER — ACETAMINOPHEN 325 MG PO TABS
ORAL_TABLET | ORAL | Status: AC
  Filled 2011-06-06: qty 2

## 2011-06-06 MED ORDER — SODIUM CHLORIDE 0.9 % IV SOLN: INTRAVENOUS | Status: DC

## 2011-07-02 ENCOUNTER — Other Ambulatory Visit (HOSPITAL_COMMUNITY): Payer: Self-pay | Admitting: *Deleted

## 2011-07-04 ENCOUNTER — Encounter (HOSPITAL_COMMUNITY)
Admission: RE | Admit: 2011-07-04 | Discharge: 2011-07-04 | Disposition: A | Discharge status: Home / Self Care | Payer: Medicare Other | Source: Ambulatory Visit | Attending: Rheumatology | Admitting: Rheumatology

## 2011-07-04 DIAGNOSIS — M329 Systemic lupus erythematosus, unspecified: Secondary | ICD-10-CM | POA: Insufficient documentation

## 2011-07-04 MED ORDER — ACETAMINOPHEN 325 MG PO TABS
650.0000 mg | ORAL_TABLET | ORAL | Status: AC
  Administered 2011-07-04: 650 mg via ORAL
  Filled 2011-07-04: qty 2

## 2011-07-04 MED ORDER — SODIUM CHLORIDE 0.9 % IV SOLN
INTRAVENOUS | Status: DC
  Administered 2011-07-04: 250 mL via INTRAVENOUS

## 2011-07-04 MED ORDER — SODIUM CHLORIDE 0.9 % IV SOLN
1000.0000 mg | INTRAVENOUS | Status: AC
  Administered 2011-07-04: 1000 mg via INTRAVENOUS
  Filled 2011-07-04: qty 12.5

## 2011-08-01 ENCOUNTER — Encounter (HOSPITAL_COMMUNITY)
Admission: RE | Admit: 2011-08-01 | Discharge: 2011-08-01 | Disposition: A | Discharge status: Home / Self Care | Payer: Medicare Other | Source: Ambulatory Visit | Attending: Rheumatology | Admitting: Rheumatology

## 2011-08-01 DIAGNOSIS — M329 Systemic lupus erythematosus, unspecified: Secondary | ICD-10-CM | POA: Insufficient documentation

## 2011-08-01 MED ORDER — SODIUM CHLORIDE 0.9 % IV SOLN: INTRAVENOUS | Status: DC

## 2011-08-01 MED ORDER — ACETAMINOPHEN 325 MG PO TABS
650.0000 mg | ORAL_TABLET | ORAL | Status: AC
  Administered 2011-08-01: 650 mg via ORAL
  Filled 2011-08-01: qty 2

## 2011-08-01 MED ORDER — SODIUM CHLORIDE 0.9 % IV SOLN
1000.0000 mg | INTRAVENOUS | Status: AC
  Administered 2011-08-01: 1000 mg via INTRAVENOUS
  Filled 2011-08-01: qty 12.5

## 2011-08-01 NOTE — Progress Notes (Signed)
CALLED DR ANDERSON'S OFFICE TO NOTIFY THAT CLIENT STATES HAS HAD ANEMIA IN PAST COUPLE OF MONTHS AND NURSE RACHEL HUDSON ASKED DR Dareen Piano AND PER DR Dareen Piano OK TO GIVE Eston Mould

## 2011-08-24 ENCOUNTER — Other Ambulatory Visit: Payer: Self-pay | Admitting: Cardiology

## 2011-08-28 ENCOUNTER — Other Ambulatory Visit (HOSPITAL_COMMUNITY): Payer: Self-pay | Admitting: *Deleted

## 2011-08-29 ENCOUNTER — Encounter (HOSPITAL_COMMUNITY)
Admission: RE | Admit: 2011-08-29 | Discharge: 2011-08-29 | Disposition: A | Discharge status: Home / Self Care | Payer: Medicare Other | Source: Ambulatory Visit | Attending: Rheumatology | Admitting: Rheumatology

## 2011-08-29 DIAGNOSIS — M329 Systemic lupus erythematosus, unspecified: Secondary | ICD-10-CM | POA: Insufficient documentation

## 2011-08-29 MED ORDER — ACETAMINOPHEN 325 MG PO TABS
650.0000 mg | ORAL_TABLET | ORAL | Status: DC
  Administered 2011-08-29: 650 mg via ORAL
  Filled 2011-08-29: qty 2

## 2011-08-29 MED ORDER — BELIMUMAB 120 MG IV SOLR
1000.0000 mg | INTRAVENOUS | Status: DC
  Administered 2011-08-29: 1000 mg via INTRAVENOUS
  Filled 2011-08-29: qty 12.5

## 2011-08-29 MED ORDER — SODIUM CHLORIDE 0.9 % IV SOLN
INTRAVENOUS | Status: DC
  Administered 2011-08-29: 11:00:00 via INTRAVENOUS

## 2011-09-05 ENCOUNTER — Encounter: Payer: Self-pay | Admitting: Critical Care Medicine

## 2011-09-05 ENCOUNTER — Ambulatory Visit (INDEPENDENT_AMBULATORY_CARE_PROVIDER_SITE_OTHER): Payer: Medicare Other | Admitting: Critical Care Medicine

## 2011-09-05 VITALS — BP 116/64 | HR 79 | Temp 97.5°F | Ht 66.0 in | Wt 223.2 lb

## 2011-09-05 DIAGNOSIS — J8410 Pulmonary fibrosis, unspecified: Secondary | ICD-10-CM

## 2011-09-05 NOTE — Progress Notes (Signed)
Subjective:    Patient ID: [name], female    DOB: 1940-07-06, 71 y.o.   MRN: [id number]  HPI  71 y.o.  white female history of mixed connected tissue disease, pulmonary fibrosis, pneumonitis,   09/05/2011 Since last ov doing well. No more bleeding /anemia issues.  Now no cough.  Dyspnea the same.  No chest pains.  No real edema in feet. Pt denies any significant sore throat, nasal congestion or excess secretions, fever, chills, sweats, unintended weight loss, pleurtic or exertional chest pain, orthopnea PND, or leg swelling Pt denies any increase in rescue therapy over baseline, denies waking up needing it or having any early am or nocturnal exacerbations of coughing/wheezing/or dyspnea. Pt also denies any obvious fluctuation in symptoms with  weather or environmental change or other alleviating or aggravating factors   Past Medical History  Diagnosis Date  . Coronary artery disease   . Atrial fibrillation     Now NSR  . GERD (gastroesophageal reflux disease)   . Hyperlipidemia   . 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 Anemia   . Angina   . Shortness of breath     with activity  . Hypertension   . Blood transfusion   . Pneumonia   . H/O hiatal hernia   . Lupus      No family history on file.   History   Social History  . Marital Status: Divorced    Spouse Name: N/A    Number of Children: N/A  . Years of Education: N/A   Occupational History  . Not on file.   Social History Main Topics  . Smoking status: Never Smoker   . Smokeless tobacco: Never Used  . Alcohol Use: No  . Drug Use: Not on file  . Sexually Active: No   Other Topics Concern  . Not on file   Social History Narrative  . No narrative on file     Allergies  Allergen Reactions  . Codeine     REACTION: nausea  . Codeine Phosphate     REACTION: unspecified     Outpatient Prescriptions Prior to Visit  Medication Sig Dispense Refill  . amiodarone (PACERONE)  200 MG tablet Take 1 tablet (200 mg total) by mouth 2 (two) times daily.  60 tablet  3  . Belimumab (BENLYSTA IV) Inject into the vein. Once monthly       . BENICAR HCT 40-12.5 MG per tablet Once daily      . ferrous sulfate 325 (65 FE) MG tablet Take 325 mg by mouth daily with breakfast.      . furosemide (LASIX) 40 MG tablet Once daily      . hydroxychloroquine (PLAQUENIL) 200 MG tablet Twice daily      . KLOR-CON M20 20 MEQ tablet Once daily      . metoprolol (LOPRESSOR) 50 MG tablet Take 50 mg by mouth 2 (two) times daily.        . pantoprazole (PROTONIX) 40 MG tablet TAKE 1 TABLET BY MOUTH ONCE DAILY  90 tablet  1  . predniSONE (DELTASONE) 5 MG tablet TAKE 1 TABLET BY MOUTH EVERY DAY  60 tablet  3  . rOPINIRole (REQUIP) 1 MG tablet Daily at bedtime      . rosuvastatin (CRESTOR) 10 MG tablet Take 10 mg by mouth daily.        Marland Kitchen sulfaSALAzine (AZULFIDINE) 500 MG tablet Take 1,000 mg by mouth 2 (  two) times daily. 2 tablets twice daily      . methotrexate (RHEUMATREX) 2.5 MG tablet Take 12.5 mg by mouth once a week. On Saturdays      . warfarin (COUMADIN) 3 MG tablet Take 1 tablet (3 mg total) by mouth daily.  35 tablet  3      Review of Systems  Constitutional:   No  weight loss, night sweats,  Fevers, chills, fatigue, lassitude. HEENT:   No headaches,  Difficulty swallowing,  Tooth/dental problems,  Sore throat,                No sneezing, itching, ear ache, nasal congestion, + post nasal drip,   CV:  No chest pain,  Orthopnea, PND, swelling in lower extremities, anasarca, dizziness, palpitations  GI  No heartburn, indigestion, abdominal pain, nausea, vomiting, diarrhea, change in bowel habits, loss of appetite  Resp: Notes  shortness of breath with exertion not at rest.  No coughing up of blood.  No chest wall deformity  Skin: no rash or lesions.  GU: no dysuria, change in color of urine, no urgency or frequency.  No flank pain.  MS:  No joint pain or swelling.  No decreased  range of motion.  No back pain.  Psych:  No change in mood or affect. No depression or anxiety.  No memory loss.     Objective:   Physical Exam  Filed Vitals:   09/05/11 1344 09/05/11 1346  BP:  116/64  Pulse:  79  Temp:  97.5 F (36.4 C)  TempSrc:  Oral  Height:  5\' 6"  (1.676 m)  Weight:  223 lb 3.2 oz (101.243 kg)  SpO2: 89% 97%    Gen: Pleasant, well-nourished, in no distress,  normal affect  ENT: No lesions,  mouth clear,  oropharynx clear, no postnasal drip  Neck: No JVD, no TMG, no carotid bruits  Lungs: No use of accessory muscles, no dullness to percussion,dry rales Coarse BS bilaterally   Cardiovascular: RRR, heart sounds normal, no murmur or gallops, no peripheral edema  Abdomen: soft and NT, no HSM,  BS normal  Musculoskeletal: No deformities, no cyanosis or clubbing  Neuro: alert, non focal  Skin: Warm, no lesions or rashes        Assessment & Plan:   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 amiodarone (PACERONE) 200 MG tablet Take 1 tablet (200 mg total) by mouth 2 (two) times daily.  60 tablet  3  . apixaban (ELIQUIS) 5 MG TABS tablet Take 5 mg by mouth 2 (two) times daily.      . Belimumab (BENLYSTA IV) Inject into the vein. Once monthly       . BENICAR HCT 40-12.5 MG per tablet Once daily      . ferrous sulfate 325 (65 FE) MG tablet Take 325 mg by mouth daily with breakfast.      . furosemide (LASIX) 40 MG tablet Once daily      .  hydroxychloroquine (PLAQUENIL) 200 MG tablet Twice daily      . KLOR-CON M20 20 MEQ tablet Once daily      .  metoprolol (LOPRESSOR) 50 MG tablet Take 50 mg by mouth 2 (two) times daily.        . pantoprazole (PROTONIX) 40 MG tablet TAKE 1 TABLET BY MOUTH ONCE DAILY  90 tablet  1  . predniSONE (DELTASONE) 5 MG tablet TAKE 1 TABLET BY MOUTH EVERY DAY  60 tablet  3  . rOPINIRole (REQUIP) 1 MG tablet Daily at bedtime      . rosuvastatin (CRESTOR) 10 MG tablet Take 10 mg by mouth daily.        Marland Kitchen sulfaSALAzine (AZULFIDINE) 500 MG tablet Take 1,000 mg by mouth 2 (two) times daily. 2 tablets twice daily      . DISCONTD: methotrexate (RHEUMATREX) 2.5 MG tablet Take 12.5 mg by mouth once a week. On Saturdays      . DISCONTD: warfarin (COUMADIN) 3 MG tablet Take 1 tablet (3 mg total) by mouth daily.  35 tablet  3         Updated Medication List Outpatient Encounter Prescriptions as of 09/05/2011  Medication Sig Dispense Refill  . amiodarone (PACERONE) 200 MG tablet Take 1 tablet (200 mg total) by mouth 2 (two) times daily.  60 tablet  3  . apixaban (ELIQUIS) 5 MG TABS tablet Take 5 mg by mouth 2 (two) times daily.      . Belimumab (BENLYSTA IV) Inject into the vein. Once monthly       . BENICAR HCT 40-12.5 MG per tablet Once daily      . ferrous sulfate 325 (65 FE) MG tablet Take 325 mg by mouth daily with breakfast.      . furosemide (LASIX) 40 MG tablet Once daily      . hydroxychloroquine (PLAQUENIL) 200 MG tablet Twice daily      . KLOR-CON M20 20 MEQ tablet Once daily      . metoprolol (LOPRESSOR) 50 MG tablet Take 50 mg by mouth 2 (two) times daily.        . pantoprazole (PROTONIX) 40 MG tablet TAKE 1 TABLET BY MOUTH ONCE DAILY  90 tablet  1  . predniSONE (DELTASONE) 5 MG tablet TAKE 1 TABLET BY MOUTH EVERY DAY  60 tablet  3  . rOPINIRole (REQUIP) 1 MG tablet Daily at bedtime      . rosuvastatin (CRESTOR) 10 MG tablet Take 10 mg by mouth daily.        Marland Kitchen sulfaSALAzine (AZULFIDINE) 500 MG  tablet Take 1,000 mg by mouth 2 (two) times daily. 2 tablets twice daily      . DISCONTD: methotrexate (RHEUMATREX) 2.5 MG tablet Take 12.5 mg by mouth once a week. On Saturdays      . DISCONTD: warfarin (COUMADIN) 3 MG tablet Take 1 tablet (3 mg total) by mouth daily.  35 tablet  3

## 2011-09-05 NOTE — Patient Instructions (Addendum)
No change in medications. Return in         4 months 

## 2011-09-05 NOTE — Assessment & Plan Note (Signed)
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

## 2011-09-11 ENCOUNTER — Other Ambulatory Visit: Payer: Self-pay | Admitting: Cardiology

## 2011-09-26 ENCOUNTER — Encounter (HOSPITAL_COMMUNITY)
Admission: RE | Admit: 2011-09-26 | Discharge: 2011-09-26 | Disposition: A | Discharge status: Home / Self Care | Payer: Medicare Other | Source: Ambulatory Visit | Attending: Rheumatology | Admitting: Rheumatology

## 2011-09-26 DIAGNOSIS — M329 Systemic lupus erythematosus, unspecified: Secondary | ICD-10-CM | POA: Insufficient documentation

## 2011-09-26 MED ORDER — SODIUM CHLORIDE 0.9 % IV SOLN
1000.0000 mg | INTRAVENOUS | Status: DC
  Administered 2011-09-26: 1000 mg via INTRAVENOUS
  Filled 2011-09-26: qty 12.5

## 2011-09-26 MED ORDER — SODIUM CHLORIDE 0.9 % IV SOLN
INTRAVENOUS | Status: DC
  Administered 2011-09-26: 11:00:00 via INTRAVENOUS

## 2011-09-26 MED ORDER — ACETAMINOPHEN 325 MG PO TABS
650.0000 mg | ORAL_TABLET | ORAL | Status: DC
  Administered 2011-09-26: 650 mg via ORAL
  Filled 2011-09-26: qty 2

## 2011-10-24 ENCOUNTER — Encounter (HOSPITAL_COMMUNITY)
Admission: RE | Admit: 2011-10-24 | Discharge: 2011-10-24 | Disposition: A | Discharge status: Home / Self Care | Payer: Medicare Other | Source: Ambulatory Visit | Attending: Rheumatology | Admitting: Rheumatology

## 2011-10-24 MED ORDER — SODIUM CHLORIDE 0.9 % IV SOLN
INTRAVENOUS | Status: DC
  Administered 2011-10-24: 11:00:00 via INTRAVENOUS

## 2011-10-24 MED ORDER — SODIUM CHLORIDE 0.9 % IV SOLN
1000.0000 mg | INTRAVENOUS | Status: DC
  Administered 2011-10-24: 1000 mg via INTRAVENOUS
  Filled 2011-10-24: qty 12.5

## 2011-10-24 MED ORDER — ACETAMINOPHEN 325 MG PO TABS: 650.0000 mg | ORAL_TABLET | ORAL | Status: DC

## 2011-11-05 ENCOUNTER — Other Ambulatory Visit: Payer: Self-pay | Admitting: Critical Care Medicine

## 2011-11-21 ENCOUNTER — Encounter (HOSPITAL_COMMUNITY)
Admission: RE | Admit: 2011-11-21 | Discharge: 2011-11-21 | Disposition: A | Discharge status: Home / Self Care | Payer: Medicare Other | Source: Ambulatory Visit | Attending: Rheumatology | Admitting: Rheumatology

## 2011-11-21 DIAGNOSIS — M329 Systemic lupus erythematosus, unspecified: Secondary | ICD-10-CM | POA: Insufficient documentation

## 2011-11-21 MED ORDER — BSS IO SOLN
INTRAOCULAR | Status: AC
  Filled 2011-11-21: qty 15

## 2011-11-21 MED ORDER — SODIUM CHLORIDE 0.9 % IV SOLN
INTRAVENOUS | Status: AC
  Administered 2011-11-21: 12:00:00 via INTRAVENOUS

## 2011-11-21 MED ORDER — ACETAMINOPHEN 325 MG PO TABS
650.0000 mg | ORAL_TABLET | ORAL | Status: AC
  Administered 2011-11-21: 650 mg via ORAL
  Filled 2011-11-21: qty 2

## 2011-11-21 MED ORDER — SODIUM CHLORIDE 0.9 % IV SOLN
1000.0000 mg | INTRAVENOUS | Status: DC
  Administered 2011-11-21: 1000 mg via INTRAVENOUS
  Filled 2011-11-21: qty 12.5

## 2011-12-19 ENCOUNTER — Encounter (HOSPITAL_COMMUNITY)
Admission: RE | Admit: 2011-12-19 | Discharge: 2011-12-19 | Disposition: A | Discharge status: Home / Self Care | Payer: Medicare Other | Source: Ambulatory Visit | Attending: Rheumatology | Admitting: Rheumatology

## 2011-12-19 DIAGNOSIS — M329 Systemic lupus erythematosus, unspecified: Secondary | ICD-10-CM | POA: Insufficient documentation

## 2011-12-19 MED ORDER — SODIUM CHLORIDE 0.9 % IV SOLN
INTRAVENOUS | Status: DC
  Administered 2011-12-19: 12:00:00 via INTRAVENOUS

## 2011-12-19 MED ORDER — SODIUM CHLORIDE 0.9 % IV SOLN
1000.0000 mg | INTRAVENOUS | Status: DC
  Administered 2011-12-19: 1000 mg via INTRAVENOUS
  Filled 2011-12-19: qty 12.5

## 2011-12-19 MED ORDER — ACETAMINOPHEN 325 MG PO TABS
650.0000 mg | ORAL_TABLET | ORAL | Status: DC
  Administered 2011-12-19: 650 mg via ORAL
  Filled 2011-12-19: qty 2

## 2012-01-04 ENCOUNTER — Ambulatory Visit (INDEPENDENT_AMBULATORY_CARE_PROVIDER_SITE_OTHER)
Admission: RE | Admit: 2012-01-04 | Discharge: 2012-01-04 | Disposition: A | Discharge status: Home / Self Care | Payer: BC Managed Care – PPO | Source: Ambulatory Visit | Attending: Critical Care Medicine | Admitting: Critical Care Medicine

## 2012-01-04 ENCOUNTER — Encounter: Payer: Self-pay | Admitting: Critical Care Medicine

## 2012-01-04 ENCOUNTER — Ambulatory Visit (INDEPENDENT_AMBULATORY_CARE_PROVIDER_SITE_OTHER): Payer: Medicare Other | Admitting: Critical Care Medicine

## 2012-01-04 VITALS — BP 140/88 | HR 81 | Temp 97.5°F | Ht 67.0 in | Wt 217.0 lb

## 2012-01-04 DIAGNOSIS — J8410 Pulmonary fibrosis, unspecified: Secondary | ICD-10-CM

## 2012-01-04 DIAGNOSIS — J189 Pneumonia, unspecified organism: Secondary | ICD-10-CM

## 2012-01-04 MED ORDER — MOXIFLOXACIN HCL 400 MG PO TABS: 400.0000 mg | ORAL_TABLET | Freq: Every day | ORAL | Status: DC

## 2012-01-04 NOTE — Progress Notes (Signed)
Subjective:    Patient ID: Charlann Lange, female    DOB: 12-16-1940, 71 y.o.   MRN: [id number]  HPI  71 y.o.  white female history of mixed connected tissue disease, pulmonary fibrosis, pneumonitis,   09/05/2011 Since last ov doing well. No more bleeding /anemia issues.  Now no cough.  Dyspnea the same.  No chest pains.  No real edema in feet. Pt denies any significant sore throat, nasal congestion or excess secretions, fever, chills, sweats, unintended weight loss, pleurtic or exertional chest pain, orthopnea PND, or leg swelling Pt denies any increase in rescue therapy over baseline, denies waking up needing it or having any early am or nocturnal exacerbations of coughing/wheezing/or dyspnea. Pt also denies any obvious fluctuation in symptoms with  weather or environmental change or other alleviating or aggravating factors  01/04/2012 Pt suddenly worse since last ov, insideous.  Notes more dyspnea, not able to do any activity. No chest pain. No real cough.  No mucus.  No edema in feet.  No arthritis.  Notes qhs dyspnea.   No f/c/s.  Now Binlista for lupus for months.  Notes more heartburn recently esp at night.  Past Medical History  Diagnosis Date  . Coronary artery disease   . Atrial fibrillation     Now NSR  . GERD (gastroesophageal reflux disease)   . Hyperlipidemia   . 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 Anemia   . Angina   . Shortness of breath     with activity  . Hypertension   . Blood transfusion   . Pneumonia   . H/O hiatal hernia   . Lupus      No family history on file.   History   Social History  . Marital Status: Divorced    Spouse Name: N/A    Number of Children: N/A  . Years of Education: N/A   Occupational History  . Not on file.   Social History Main Topics  . Smoking status: Never Smoker   . Smokeless tobacco: Never Used  . Alcohol Use: No  . Drug Use: Not on file  . Sexually Active: No   Other Topics Concern    . Not on file   Social History Narrative  . No narrative on file     Allergies  Allergen Reactions  . Codeine     REACTION: nausea  . Codeine Phosphate     REACTION: unspecified     Outpatient Prescriptions Prior to Visit  Medication Sig Dispense Refill  . amiodarone (PACERONE) 200 MG tablet Take 1 tablet (200 mg total) by mouth 2 (two) times daily.  60 tablet  3  . apixaban (ELIQUIS) 5 MG TABS tablet Take 5 mg by mouth 2 (two) times daily.      . Belimumab (BENLYSTA IV) Inject into the vein. Once monthly       . BENICAR HCT 40-12.5 MG per tablet Once daily      . ferrous sulfate 325 (65 FE) MG tablet Take 325 mg by mouth daily with breakfast.      . furosemide (LASIX) 40 MG tablet Once daily      . hydroxychloroquine (PLAQUENIL) 200 MG tablet Twice daily      . KLOR-CON M20 20 MEQ tablet Once daily      . metoprolol (LOPRESSOR) 50 MG tablet Take 50 mg by mouth 2 (two) times daily.        . pantoprazole (PROTONIX)  40 MG tablet TAKE 1 TABLET BY MOUTH ONCE DAILY  90 tablet  1  . predniSONE (DELTASONE) 5 MG tablet TAKE 1 TABLET BY MOUTH EVERY DAY  60 tablet  3  . rOPINIRole (REQUIP) 1 MG tablet Daily at bedtime      . rosuvastatin (CRESTOR) 10 MG tablet Take 10 mg by mouth daily.        Marland Kitchen sulfaSALAzine (AZULFIDINE) 500 MG tablet Take 1,000 mg by mouth 2 (two) times daily. 2 tablets twice daily          Review of Systems  Constitutional:   No  weight loss, night sweats,  Fevers, chills, fatigue, lassitude. HEENT:   No headaches,  Difficulty swallowing,  Tooth/dental problems,  Sore throat,                No sneezing, itching, ear ache, nasal congestion, + post nasal drip,   CV:  No chest pain,  Orthopnea, PND, swelling in lower extremities, anasarca, dizziness, palpitations  GI  No heartburn, indigestion, abdominal pain, nausea, vomiting, diarrhea, change in bowel habits, loss of appetite  Resp: Notes  shortness of breath with exertion not at rest.  No coughing up of blood.   No chest wall deformity  Skin: no rash or lesions.  GU: no dysuria, change in color of urine, no urgency or frequency.  No flank pain.  MS:  No joint pain or swelling.  No decreased range of motion.  No back pain.  Psych:  No change in mood or affect. No depression or anxiety.  No memory loss.     Objective:   Physical Exam  Filed Vitals:   01/04/12 0906 01/04/12 0907  BP:  140/88  Pulse:  81  Temp:  97.5 F (36.4 C)  TempSrc:  Oral  Height:  5\' 7"  (1.702 m)  Weight:  217 lb (98.431 kg)  SpO2: 87% 91%    Gen: Pleasant, well-nourished, in no distress,  normal affect  ENT: No lesions,  mouth clear,  oropharynx clear, no postnasal drip  Neck: No JVD, no TMG, no carotid bruits  Lungs: No use of accessory muscles, no dullness to percussion,dry rales Coarse BS bilaterally   Cardiovascular: RRR, heart sounds normal, no murmur or gallops, no peripheral edema  Abdomen: soft and NT, no HSM,  BS normal  Musculoskeletal: No deformities, no cyanosis or clubbing  Neuro: alert, non focal  Skin: Warm, no lesions or rashes       Dg Chest 2 View  01/04/2012  *RADIOLOGY REPORT*  Clinical Data: Dyspnea.  Coughing.  Hypoxemia.  Hypertension. Shortness of breath.  CHEST - 2 VIEW  Comparison: 11/29/2010.  CT 05/04/2011.  Findings: There is moderate cardiac silhouette enlargement which is stable. Ectasia and nonaneurysmal calcification of the thoracic aorta are seen. The patient has undergone previous median sternotomy and coronary artery bypass grafting.  Left lung appears free of infiltrates.  There is elevation of the right hemidiaphragm with atelectasis and infiltrate in the right base.  No consolidation is evident.  No pleural effusion or pneumothorax is evident.  There is osteopenic appearance of bones.  Changes of degenerative disc disease and degenerative spondylosis are present.  IMPRESSION: Cardiac silhouette enlargement.  Elevation of right hemidiaphragm with atelectasis and  infiltrative density in the right base.  No consolidation or pleural effusion is evident.   Original Report Authenticated By: Crawford Givens, M.D.    Assessment & Plan:   Pneumonia RLL PNA with immunosuppressed state Plan Take avelox one  daily for 10days Return 1 week for recheck and Chest xray first     Updated Medication List Outpatient Encounter Prescriptions as of 01/04/2012  Medication Sig Dispense Refill  . amiodarone (PACERONE) 200 MG tablet Take 1 tablet (200 mg total) by mouth 2 (two) times daily.  60 tablet  3  . apixaban (ELIQUIS) 5 MG TABS tablet Take 5 mg by mouth 2 (two) times daily.      . Belimumab (BENLYSTA IV) Inject into the vein. Once monthly       . BENICAR HCT 40-12.5 MG per tablet Once daily      . ferrous sulfate 325 (65 FE) MG tablet Take 325 mg by mouth daily with breakfast.      . furosemide (LASIX) 40 MG tablet Once daily      . hydroxychloroquine (PLAQUENIL) 200 MG tablet Twice daily      . KLOR-CON M20 20 MEQ tablet Once daily      . metoprolol (LOPRESSOR) 50 MG tablet Take 50 mg by mouth 2 (two) times daily.        . pantoprazole (PROTONIX) 40 MG tablet TAKE 1 TABLET BY MOUTH ONCE DAILY  90 tablet  1  . predniSONE (DELTASONE) 5 MG tablet TAKE 1 TABLET BY MOUTH EVERY DAY  60 tablet  3  . rOPINIRole (REQUIP) 1 MG tablet Daily at bedtime      . rosuvastatin (CRESTOR) 10 MG tablet Take 10 mg by mouth daily.        Marland Kitchen sulfaSALAzine (AZULFIDINE) 500 MG tablet Take 1,000 mg by mouth 2 (two) times daily. 2 tablets twice daily      . moxifloxacin (AVELOX) 400 MG tablet Take 1 tablet (400 mg total) by mouth daily.  3 tablet  0  . DISCONTD: moxifloxacin (AVELOX) 400 MG tablet Take 1 tablet (400 mg total) by mouth daily.  7 tablet  0         Updated Medication List Outpatient Encounter Prescriptions as of 01/04/2012  Medication Sig Dispense Refill  . amiodarone (PACERONE) 200 MG tablet Take 1 tablet (200 mg total) by mouth 2 (two) times daily.  60 tablet  3  .  apixaban (ELIQUIS) 5 MG TABS tablet Take 5 mg by mouth 2 (two) times daily.      . Belimumab (BENLYSTA IV) Inject into the vein. Once monthly       . BENICAR HCT 40-12.5 MG per tablet Once daily      . ferrous sulfate 325 (65 FE) MG tablet Take 325 mg by mouth daily with breakfast.      . furosemide (LASIX) 40 MG tablet Once daily      . hydroxychloroquine (PLAQUENIL) 200 MG tablet Twice daily      . KLOR-CON M20 20 MEQ tablet Once daily      . metoprolol (LOPRESSOR) 50 MG tablet Take 50 mg by mouth 2 (two) times daily.        . pantoprazole (PROTONIX) 40 MG tablet TAKE 1 TABLET BY MOUTH ONCE DAILY  90 tablet  1  . predniSONE (DELTASONE) 5 MG tablet TAKE 1 TABLET BY MOUTH EVERY DAY  60 tablet  3  . rOPINIRole (REQUIP) 1 MG tablet Daily at bedtime      . rosuvastatin (CRESTOR) 10 MG tablet Take 10 mg by mouth daily.        Marland Kitchen sulfaSALAzine (AZULFIDINE) 500 MG tablet Take 1,000 mg by mouth 2 (two) times daily. 2 tablets twice daily      .  moxifloxacin (AVELOX) 400 MG tablet Take 1 tablet (400 mg total) by mouth daily.  3 tablet  0  . DISCONTD: moxifloxacin (AVELOX) 400 MG tablet Take 1 tablet (400 mg total) by mouth daily.  7 tablet  0

## 2012-01-04 NOTE — Patient Instructions (Addendum)
Take avelox one daily for 10days Return 1 week for recheck and Chest xray first

## 2012-01-05 DIAGNOSIS — J189 Pneumonia, unspecified organism: Secondary | ICD-10-CM | POA: Insufficient documentation

## 2012-01-05 NOTE — Assessment & Plan Note (Signed)
RLL PNA with immunosuppressed state Plan Take avelox one daily for 10days Return 1 week for recheck and Chest xray first

## 2012-01-11 ENCOUNTER — Ambulatory Visit (INDEPENDENT_AMBULATORY_CARE_PROVIDER_SITE_OTHER): Payer: Medicare Other | Admitting: Critical Care Medicine

## 2012-01-11 ENCOUNTER — Ambulatory Visit (INDEPENDENT_AMBULATORY_CARE_PROVIDER_SITE_OTHER)
Admission: RE | Admit: 2012-01-11 | Discharge: 2012-01-11 | Disposition: A | Discharge status: Home / Self Care | Payer: Medicare Other | Source: Ambulatory Visit | Attending: Critical Care Medicine | Admitting: Critical Care Medicine

## 2012-01-11 ENCOUNTER — Encounter: Payer: Self-pay | Admitting: Critical Care Medicine

## 2012-01-11 ENCOUNTER — Other Ambulatory Visit: Payer: Self-pay | Admitting: Critical Care Medicine

## 2012-01-11 ENCOUNTER — Ambulatory Visit
Admission: RE | Admit: 2012-01-11 | Discharge: 2012-01-11 | Disposition: A | Discharge status: Home / Self Care | Payer: Medicare Other | Source: Ambulatory Visit | Attending: Critical Care Medicine | Admitting: Critical Care Medicine

## 2012-01-11 ENCOUNTER — Telehealth: Payer: Self-pay | Admitting: Critical Care Medicine

## 2012-01-11 ENCOUNTER — Other Ambulatory Visit (INDEPENDENT_AMBULATORY_CARE_PROVIDER_SITE_OTHER): Payer: Medicare Other

## 2012-01-11 VITALS — BP 134/82 | HR 64 | Temp 97.5°F

## 2012-01-11 DIAGNOSIS — R0600 Dyspnea, unspecified: Secondary | ICD-10-CM

## 2012-01-11 DIAGNOSIS — J8410 Pulmonary fibrosis, unspecified: Secondary | ICD-10-CM

## 2012-01-11 DIAGNOSIS — R0902 Hypoxemia: Secondary | ICD-10-CM

## 2012-01-11 DIAGNOSIS — J189 Pneumonia, unspecified organism: Secondary | ICD-10-CM

## 2012-01-11 DIAGNOSIS — R0989 Other specified symptoms and signs involving the circulatory and respiratory systems: Secondary | ICD-10-CM

## 2012-01-11 LAB — BASIC METABOLIC PANEL
BUN: 17 mg/dL (ref 6–23)
GFR: 51.66 mL/min — ABNORMAL LOW (ref >60.00)
Potassium: 3.7 mEq/L (ref 3.5–5.1)
Sodium: 139 mEq/L (ref 135–145)

## 2012-01-11 MED ORDER — PREDNISONE 10 MG PO TABS: ORAL_TABLET | ORAL | Status: DC

## 2012-01-11 MED ORDER — BUDESONIDE-FORMOTEROL FUMARATE 160-4.5 MCG/ACT IN AERO: 2.0000 | INHALATION_SPRAY | Freq: Two times a day (BID) | RESPIRATORY_TRACT | Status: DC

## 2012-01-11 MED ORDER — IOHEXOL 350 MG/ML SOLN
125.0000 mL | Freq: Once | INTRAVENOUS | Status: AC | PRN
  Administered 2012-01-11: 125 mL via INTRAVENOUS

## 2012-01-11 NOTE — Telephone Encounter (Signed)
Spoke with pt and notified of results/recs per PW Pt verbalized understanding and states no questions  Will start symbicort, and keep ov in Nov

## 2012-01-11 NOTE — Telephone Encounter (Signed)
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

## 2012-01-11 NOTE — Patient Instructions (Addendum)
Prednisone 10mg  Take 4 for three days 3 for three days 2 for three days 1 for three days and stop Start oxygen 2Liter rest 3-4Liter exertion Lab today CT Angiogram today, evaluate for blood clots Finish avelox  Return 3 weeks

## 2012-01-11 NOTE — Telephone Encounter (Signed)
Please advise ct results, thanks

## 2012-01-11 NOTE — Telephone Encounter (Signed)
ATC pt line busy WCB 

## 2012-01-11 NOTE — Assessment & Plan Note (Signed)
Right lower lobe airspace disease slightly improved on x-ray today the patient remains very hypoxic. The patient is on Type Xa. inhibitor for anticoagulation however I am concerned about the possibility of breakthrough pulmonary embolism also possibility of bronchiolitis obliterans organized pneumonia is considered Plan Administer oxygen Prednisone 10mg  Take 4 for three days 3 for three days 2 for three days 1 for three days and stop Start oxygen 2Liter rest 3-4Liter exertion Lab today CT Angiogram today, evaluate for blood clots Finish avelox  Return 3 weeks

## 2012-01-11 NOTE — Progress Notes (Signed)
Subjective:    Patient ID: [name], female    DOB: November 26, 1940, 71 y.o.   MRN: [id number]  HPI  71 y.o.  white female history of mixed connected tissue disease, pulmonary fibrosis, pneumonitis,   09/05/2011 Since last ov doing well. No more bleeding /anemia issues.  Now no cough.  Dyspnea the same.  No chest pains.  No real edema in feet. Pt denies any significant sore throat, nasal congestion or excess secretions, fever, chills, sweats, unintended weight loss, pleurtic or exertional chest pain, orthopnea PND, or leg swelling Pt denies any increase in rescue therapy over baseline, denies waking up needing it or having any early am or nocturnal exacerbations of coughing/wheezing/or dyspnea. Pt also denies any obvious fluctuation in symptoms with  weather or environmental change or other alleviating or aggravating factors  01/04/2012 Pt suddenly worse since last ov, insideous.  Notes more dyspnea, not able to do any activity. No chest pain. No real cough.  No mucus.  No edema in feet.  No arthritis.  Notes qhs dyspnea.   No f/c/s.  Now Binlista for lupus for months.  Notes more heartburn recently esp at night.  01/11/2012 Pt seen one week ago for PNA.  No real change after 10days of Avelox Sats 80% on arrival.  Pt feels the same from one week ago.  No real cough.  No chest pain or fever. Pt notes pndrip.  No leg pain.  No heartburn at night now.  Notes some QHS dyspnea and awakens in a panic state.  Pt notes severe DOE    Past Medical History  Diagnosis Date  . Coronary artery disease   . Atrial fibrillation     Now NSR  . GERD (gastroesophageal reflux disease)   . Hyperlipidemia   . 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 Anemia   . Angina   . Shortness of breath     with activity  . Hypertension   . Blood transfusion   . Pneumonia   . H/O hiatal hernia   . Lupus      No family history on file.   History   Social History  . Marital Status:  Divorced    Spouse Name: N/A    Number of Children: N/A  . Years of Education: N/A   Occupational History  . Not on file.   Social History Main Topics  . Smoking status: Never Smoker   . Smokeless tobacco: Never Used  . Alcohol Use: No  . Drug Use: Not on file  . Sexually Active: No   Other Topics Concern  . Not on file   Social History Narrative  . No narrative on file     Allergies  Allergen Reactions  . Codeine     REACTION: nausea  . Codeine Phosphate     REACTION: unspecified     Outpatient Prescriptions Prior to Visit  Medication Sig Dispense Refill  . amiodarone (PACERONE) 200 MG tablet Take 1 tablet (200 mg total) by mouth 2 (two) times daily.  60 tablet  3  . apixaban (ELIQUIS) 5 MG TABS tablet Take 5 mg by mouth 2 (two) times daily.      . Belimumab (BENLYSTA IV) Inject into the vein. Once monthly       . BENICAR HCT 40-12.5 MG per tablet Once daily      . ferrous sulfate 325 (65 FE) MG tablet Take 325 mg by mouth daily with  breakfast.      . furosemide (LASIX) 40 MG tablet Once daily      . hydroxychloroquine (PLAQUENIL) 200 MG tablet Twice daily      . KLOR-CON M20 20 MEQ tablet Once daily      . metoprolol (LOPRESSOR) 50 MG tablet Take 50 mg by mouth 2 (two) times daily.        Marland Kitchen moxifloxacin (AVELOX) 400 MG tablet Take 1 tablet (400 mg total) by mouth daily.  3 tablet  0  . pantoprazole (PROTONIX) 40 MG tablet TAKE 1 TABLET BY MOUTH ONCE DAILY  90 tablet  1  . rOPINIRole (REQUIP) 1 MG tablet Daily at bedtime      . rosuvastatin (CRESTOR) 10 MG tablet Take 10 mg by mouth daily.        Marland Kitchen sulfaSALAzine (AZULFIDINE) 500 MG tablet Take 1,000 mg by mouth 2 (two) times daily. 2 tablets twice daily      . predniSONE (DELTASONE) 5 MG tablet TAKE 1 TABLET BY MOUTH EVERY DAY  60 tablet  3      Review of Systems  Constitutional:   No  weight loss, night sweats,  Fevers, chills, fatigue, lassitude. HEENT:   No headaches,  Difficulty swallowing,  Tooth/dental  problems,  Sore throat,                No sneezing, itching, ear ache, nasal congestion, + post nasal drip,   CV:  No chest pain,  Orthopnea, PND, swelling in lower extremities, anasarca, dizziness, palpitations  GI  No heartburn, indigestion, abdominal pain, nausea, vomiting, diarrhea, change in bowel habits, loss of appetite  Resp: Notes  shortness of breath with exertion not at rest.  No coughing up of blood.  No chest wall deformity  Skin: no rash or lesions.  GU: no dysuria, change in color of urine, no urgency or frequency.  No flank pain.  MS:  No joint pain or swelling.  No decreased range of motion.  No back pain.  Psych:  No change in mood or affect. No depression or anxiety.  No memory loss.     Objective:   Physical Exam  Filed Vitals:   01/11/12 1153 01/11/12 1157  BP:  134/82  Pulse:  64  Temp:  97.5 F (36.4 C)  TempSrc:  Oral  SpO2: 84% 90%    Gen: Pleasant, well-nourished, in no distress,  normal affect  ENT: No lesions,  mouth clear,  oropharynx clear, no postnasal drip  Neck: No JVD, no TMG, no carotid bruits  Lungs: No use of accessory muscles, no dullness to percussion,dry rales Coarse BS bilaterally   Cardiovascular: RRR, heart sounds normal, no murmur or gallops, no peripheral edema  Abdomen: soft and NT, no HSM,  BS normal  Musculoskeletal: No deformities, no cyanosis or clubbing  Neuro: alert, non focal  Skin: Warm, no lesions or rashes      Lab 01/11/12 1248  NA 139  K 3.7  CL 105  CO2 28  GLUCOSE 105*  BUN 17  CREATININE 1.1  CALCIUM 9.1  MG --  PHOS --     Dg Chest 2 View  01/11/2012  *RADIOLOGY REPORT*  Clinical Data: Cough, shortness of breath.  Follow-up pneumonia.  CHEST - 2 VIEW  Comparison:  01/04/2012  Findings: Prior CABG.  Cardiomegaly.  Patchy opacity persists in the right lung base, atelectasis, scarring or infiltrate.  No real change.  No focal opacity on the left.  No effusions.  No acute bony abnormality.   IMPRESSION: Stable right basilar opacity which represent atelectasis, scarring or infiltrate.  Stable cardiomegaly, vascular congestion.   Original Report Authenticated By: Cyndie Chime, M.D.    Assessment & Plan:   Pneumonia Right lower lobe airspace disease slightly improved on x-ray today the patient remains very hypoxic. The patient is on Type Xa. inhibitor for anticoagulation however I am concerned about the possibility of breakthrough pulmonary embolism also possibility of bronchiolitis obliterans organized pneumonia is considered Plan Administer oxygen Prednisone 10mg  Take 4 for three days 3 for three days 2 for three days 1 for three days and stop Start oxygen 2Liter rest 3-4Liter exertion Lab today CT Angiogram today, evaluate for blood clots Finish avelox  Return 3 weeks     Updated Medication List Outpatient Encounter Prescriptions as of 01/11/2012  Medication Sig Dispense Refill  . amiodarone (PACERONE) 200 MG tablet Take 1 tablet (200 mg total) by mouth 2 (two) times daily.  60 tablet  3  . apixaban (ELIQUIS) 5 MG TABS tablet Take 5 mg by mouth 2 (two) times daily.      . Belimumab (BENLYSTA IV) Inject into the vein. Once monthly       . BENICAR HCT 40-12.5 MG per tablet Once daily      . ferrous sulfate 325 (65 FE) MG tablet Take 325 mg by mouth daily with breakfast.      . furosemide (LASIX) 40 MG tablet Once daily      . hydroxychloroquine (PLAQUENIL) 200 MG tablet Twice daily      . KLOR-CON M20 20 MEQ tablet Once daily      . metoprolol (LOPRESSOR) 50 MG tablet Take 50 mg by mouth 2 (two) times daily.        Marland Kitchen moxifloxacin (AVELOX) 400 MG tablet Take 1 tablet (400 mg total) by mouth daily.  3 tablet  0  . pantoprazole (PROTONIX) 40 MG tablet TAKE 1 TABLET BY MOUTH ONCE DAILY  90 tablet  1  . rOPINIRole (REQUIP) 1 MG tablet Daily at bedtime      . rosuvastatin (CRESTOR) 10 MG tablet Take 10 mg by mouth daily.        Marland Kitchen sulfaSALAzine (AZULFIDINE) 500 MG tablet Take  1,000 mg by mouth 2 (two) times daily. 2 tablets twice daily      . DISCONTD: predniSONE (DELTASONE) 5 MG tablet TAKE 1 TABLET BY MOUTH EVERY DAY  60 tablet  3  . predniSONE (DELTASONE) 10 MG tablet Take 4 for three days 3 for three days 2 for three days 1 for three days and stop  30 tablet  0         Updated Medication List Outpatient Encounter Prescriptions as of 01/11/2012  Medication Sig Dispense Refill  . amiodarone (PACERONE) 200 MG tablet Take 1 tablet (200 mg total) by mouth 2 (two) times daily.  60 tablet  3  . apixaban (ELIQUIS) 5 MG TABS tablet Take 5 mg by mouth 2 (two) times daily.      . Belimumab (BENLYSTA IV) Inject into the vein. Once monthly       . BENICAR HCT 40-12.5 MG per tablet Once daily      . ferrous sulfate 325 (65 FE) MG tablet Take 325 mg by mouth daily with breakfast.      . furosemide (LASIX) 40 MG tablet Once daily      . hydroxychloroquine (PLAQUENIL) 200 MG tablet Twice daily      .  KLOR-CON M20 20 MEQ tablet Once daily      . metoprolol (LOPRESSOR) 50 MG tablet Take 50 mg by mouth 2 (two) times daily.        Marland Kitchen moxifloxacin (AVELOX) 400 MG tablet Take 1 tablet (400 mg total) by mouth daily.  3 tablet  0  . pantoprazole (PROTONIX) 40 MG tablet TAKE 1 TABLET BY MOUTH ONCE DAILY  90 tablet  1  . rOPINIRole (REQUIP) 1 MG tablet Daily at bedtime      . rosuvastatin (CRESTOR) 10 MG tablet Take 10 mg by mouth daily.        Marland Kitchen sulfaSALAzine (AZULFIDINE) 500 MG tablet Take 1,000 mg by mouth 2 (two) times daily. 2 tablets twice daily      . DISCONTD: predniSONE (DELTASONE) 5 MG tablet TAKE 1 TABLET BY MOUTH EVERY DAY  60 tablet  3  . predniSONE (DELTASONE) 10 MG tablet Take 4 for three days 3 for three days 2 for three days 1 for three days and stop  30 tablet  0

## 2012-01-16 ENCOUNTER — Encounter (HOSPITAL_COMMUNITY): Payer: Medicare Other

## 2012-01-29 ENCOUNTER — Ambulatory Visit (INDEPENDENT_AMBULATORY_CARE_PROVIDER_SITE_OTHER): Payer: Medicare Other | Admitting: Critical Care Medicine

## 2012-01-29 ENCOUNTER — Encounter: Payer: Self-pay | Admitting: Critical Care Medicine

## 2012-01-29 VITALS — BP 122/60 | HR 75 | Temp 98.9°F

## 2012-01-29 DIAGNOSIS — J8410 Pulmonary fibrosis, unspecified: Secondary | ICD-10-CM

## 2012-01-29 MED ORDER — PREDNISONE 10 MG PO TABS: ORAL_TABLET | ORAL | Status: DC

## 2012-01-29 MED ORDER — BUDESONIDE-FORMOTEROL FUMARATE 160-4.5 MCG/ACT IN AERO: 2.0000 | INHALATION_SPRAY | Freq: Two times a day (BID) | RESPIRATORY_TRACT | Status: DC

## 2012-01-29 NOTE — Patient Instructions (Addendum)
Increase prednisone to 10mg   Take 4 for four days 3 for four days then two a day and stay Stay on symbicort two puff twice daily Stay on oxygen Return one week

## 2012-01-29 NOTE — Assessment & Plan Note (Signed)
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

## 2012-01-29 NOTE — Progress Notes (Signed)
Subjective:    Patient ID: [name], female    DOB: 1940/05/24, 71 y.o.   MRN: [id number]  HPI  71 y.o.  white female history of mixed connected tissue disease, pulmonary fibrosis, pneumonitis,   09/05/2011 Since last ov doing well. No more bleeding /anemia issues.  Now no cough.  Dyspnea the same.  No chest pains.  No real edema in feet. Pt denies any significant sore throat, nasal congestion or excess secretions, fever, chills, sweats, unintended weight loss, pleurtic or exertional chest pain, orthopnea PND, or leg swelling Pt denies any increase in rescue therapy over baseline, denies waking up needing it or having any early am or nocturnal exacerbations of coughing/wheezing/or dyspnea. Pt also denies any obvious fluctuation in symptoms with  weather or environmental change or other alleviating or aggravating factors  01/04/2012 Pt suddenly worse since last ov, insideous.  Notes more dyspnea, not able to do any activity. No chest pain. No real cough.  No mucus.  No edema in feet.  No arthritis.  Notes qhs dyspnea.   No f/c/s.  Now Binlista for lupus for months.  Notes more heartburn recently esp at night.  01/11/2012 Pt seen one week ago for PNA.  No real change after 10days of Avelox Sats 80% on arrival.  Pt feels the same from one week ago.  No real cough.  No chest pain or fever. Pt notes pndrip.  No leg pain.  No heartburn at night now.  Notes some QHS dyspnea and awakens in a panic state.  Pt notes severe DOE  01/29/2012 Pt worse since off prednisone.   Notes more weakness. Notes dry cough. Nose is irritated from oxygen No chest pain.   No f/c/s.   Pt denies any significant sore throat, nasal congestion or excess secretions, fever, chills, sweats, unintended weight loss, pleurtic or exertional chest pain, orthopnea PND, or leg swelling Pt denies any increase in rescue therapy over baseline, denies waking up needing it or having any early am or nocturnal exacerbations of  coughing/wheezing/or dyspnea. Pt also denies any obvious fluctuation in symptoms with  weather or environmental change or other alleviating or aggravating factors    Past Medical History  Diagnosis Date  . Coronary artery disease   . Atrial fibrillation     Now NSR  . GERD (gastroesophageal reflux disease)   . Hyperlipidemia   . 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 Anemia   . Angina   . Shortness of breath     with activity  . Hypertension   . Blood transfusion   . Pneumonia   . H/O hiatal hernia   . Lupus      No family history on file.   History   Social History  . Marital Status: Divorced    Spouse Name: N/A    Number of Children: N/A  . Years of Education: N/A   Occupational History  . Not on file.   Social History Main Topics  . Smoking status: Never Smoker   . Smokeless tobacco: Never Used  . Alcohol Use: No  . Drug Use: Not on file  . Sexually Active: No   Other Topics Concern  . Not on file   Social History Narrative  . No narrative on file     Allergies  Allergen Reactions  . Codeine     REACTION: nausea  . Codeine Phosphate     REACTION: unspecified  Outpatient Prescriptions Prior to Visit  Medication Sig Dispense Refill  . amiodarone (PACERONE) 200 MG tablet Take 1 tablet (200 mg total) by mouth 2 (two) times daily.  60 tablet  3  . apixaban (ELIQUIS) 5 MG TABS tablet Take 5 mg by mouth 2 (two) times daily.      . Belimumab (BENLYSTA IV) Inject into the vein. Once monthly       . BENICAR HCT 40-12.5 MG per tablet Once daily      . budesonide-formoterol (SYMBICORT) 160-4.5 MCG/ACT inhaler Inhale 2 puffs into the lungs 2 (two) times daily.  1 Inhaler  12  . ferrous sulfate 325 (65 FE) MG tablet Take 325 mg by mouth daily with breakfast.      . furosemide (LASIX) 40 MG tablet Once daily      . hydroxychloroquine (PLAQUENIL) 200 MG tablet Twice daily      . KLOR-CON M20 20 MEQ tablet Once daily      . metoprolol  (LOPRESSOR) 50 MG tablet Take 50 mg by mouth 2 (two) times daily.        . pantoprazole (PROTONIX) 40 MG tablet TAKE 1 TABLET BY MOUTH ONCE DAILY  90 tablet  1  . rOPINIRole (REQUIP) 1 MG tablet Daily at bedtime      . rosuvastatin (CRESTOR) 10 MG tablet Take 10 mg by mouth daily.        Marland Kitchen sulfaSALAzine (AZULFIDINE) 500 MG tablet Take 1,000 mg by mouth 2 (two) times daily. 2 tablets twice daily      . moxifloxacin (AVELOX) 400 MG tablet Take 1 tablet (400 mg total) by mouth daily.  3 tablet  0  . predniSONE (DELTASONE) 10 MG tablet Take 4 for three days 3 for three days 2 for three days 1 for three days and stop  30 tablet  0      Review of Systems  Constitutional:   No  weight loss, night sweats,  Fevers, chills, fatigue, lassitude. HEENT:   No headaches,  Difficulty swallowing,  Tooth/dental problems,  Sore throat,                No sneezing, itching, ear ache, nasal congestion, + post nasal drip,   CV:  No chest pain,  Orthopnea, PND, swelling in lower extremities, anasarca, dizziness, palpitations  GI  No heartburn, indigestion, abdominal pain, nausea, vomiting, diarrhea, change in bowel habits, loss of appetite  Resp: Notes  shortness of breath with exertion not at rest.  No coughing up of blood.  No chest wall deformity  Skin: no rash or lesions.  GU: no dysuria, change in color of urine, no urgency or frequency.  No flank pain.  MS:  No joint pain or swelling.  No decreased range of motion.  No back pain.  Psych:  No change in mood or affect. No depression or anxiety.  No memory loss.     Objective:   Physical Exam  Filed Vitals:   01/29/12 1203  BP: 122/60  Pulse: 75  Temp: 98.9 F (37.2 C)  TempSrc: Oral  SpO2: 98%    Gen: Pleasant, well-nourished, in no distress,  normal affect  ENT: No lesions,  mouth clear,  oropharynx clear, no postnasal drip  Neck: No JVD, no TMG, no carotid bruits  Lungs: No use of accessory muscles, no dullness to percussion,dry  rales Coarse BS bilaterally   Cardiovascular: RRR, heart sounds normal, no murmur or gallops, no peripheral edema  Abdomen: soft and NT,  no HSM,  BS normal  Musculoskeletal: No deformities, no cyanosis or clubbing  Neuro: alert, non focal  Skin: Warm, no lesions or rashes   HFA technique reviewed and found inadequate, pt retrained up to 75% efficiency Assessment & Plan:   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 amiodarone (PACERONE) 200 MG tablet Take 1 tablet (200 mg total) by mouth 2 (two) times daily.  60 tablet  3  . apixaban (ELIQUIS) 5 MG TABS tablet Take 5 mg by mouth 2 (two) times daily.      . Belimumab (BENLYSTA IV) Inject into the vein. Once monthly       . BENICAR HCT 40-12.5 MG per tablet Once daily      . budesonide-formoterol (SYMBICORT) 160-4.5 MCG/ACT inhaler Inhale 2 puffs into the lungs 2 (two) times daily.  1 Inhaler  12  . ferrous sulfate 325 (65 FE) MG tablet Take 325 mg by mouth daily with breakfast.      . furosemide (LASIX) 40 MG tablet Once daily      . hydroxychloroquine (PLAQUENIL) 200 MG tablet Twice daily      . KLOR-CON M20 20 MEQ tablet Once daily      . metoprolol (LOPRESSOR) 50 MG tablet Take 50 mg by mouth 2 (two) times daily.        . pantoprazole (PROTONIX) 40 MG tablet TAKE 1 TABLET BY MOUTH ONCE DAILY  90 tablet  1  . predniSONE (DELTASONE) 10 MG tablet Take 4 for four days 3 for four days 2 for four days  then two daily and stay  100 tablet  6  . rOPINIRole (REQUIP) 1 MG tablet Daily at bedtime      . rosuvastatin (CRESTOR) 10 MG tablet Take 10 mg by mouth daily.        Marland Kitchen sulfaSALAzine (AZULFIDINE) 500 MG tablet Take 1,000 mg by mouth 2 (two) times daily. 2 tablets twice daily      . [DISCONTINUED] predniSONE (DELTASONE) 10 MG tablet 5 mg. Take 1 tablet daily by mouth      . budesonide-formoterol (SYMBICORT) 160-4.5 MCG/ACT inhaler Inhale 2 puffs into the lungs 2 (two) times daily.  1 Inhaler  0  . [DISCONTINUED] moxifloxacin (AVELOX) 400 MG tablet Take 1 tablet (400 mg total) by mouth daily.  3 tablet  0  . [DISCONTINUED] predniSONE (DELTASONE) 10 MG tablet Take 4 for three days 3 for three days 2 for three days 1 for three days and stop  30 tablet  0         Updated Medication List Outpatient Encounter Prescriptions as of 01/29/2012  Medication Sig Dispense Refill  . amiodarone (PACERONE) 200 MG tablet Take 1 tablet (200 mg total) by mouth 2 (two) times daily.  60 tablet  3  . apixaban (ELIQUIS) 5 MG TABS tablet Take 5 mg by mouth 2 (two) times daily.      . Belimumab (BENLYSTA IV) Inject into the vein. Once monthly       .  BENICAR HCT 40-12.5 MG per tablet Once daily      . budesonide-formoterol (SYMBICORT) 160-4.5 MCG/ACT inhaler Inhale 2 puffs into the lungs 2 (two) times daily.  1 Inhaler  12  . ferrous sulfate 325 (65 FE) MG tablet Take 325 mg by mouth daily with breakfast.      . furosemide (LASIX) 40 MG tablet Once daily      . hydroxychloroquine (PLAQUENIL) 200 MG tablet Twice daily      . KLOR-CON M20 20 MEQ tablet Once daily      . metoprolol (LOPRESSOR) 50 MG tablet Take 50 mg by mouth 2 (two) times daily.        . pantoprazole (PROTONIX) 40 MG tablet TAKE 1 TABLET BY MOUTH ONCE DAILY  90 tablet  1  . predniSONE (DELTASONE) 10 MG tablet Take 4 for four days 3 for four days 2 for four days then two daily and stay  100 tablet  6  . rOPINIRole (REQUIP) 1 MG tablet Daily at  bedtime      . rosuvastatin (CRESTOR) 10 MG tablet Take 10 mg by mouth daily.        Marland Kitchen sulfaSALAzine (AZULFIDINE) 500 MG tablet Take 1,000 mg by mouth 2 (two) times daily. 2 tablets twice daily      . [DISCONTINUED] predniSONE (DELTASONE) 10 MG tablet 5 mg. Take 1 tablet daily by mouth      . budesonide-formoterol (SYMBICORT) 160-4.5 MCG/ACT inhaler Inhale 2 puffs into the lungs 2 (two) times daily.  1 Inhaler  0  . [DISCONTINUED] moxifloxacin (AVELOX) 400 MG tablet Take 1 tablet (400 mg total) by mouth daily.  3 tablet  0  . [DISCONTINUED] predniSONE (DELTASONE) 10 MG tablet Take 4 for three days 3 for three days 2 for three days 1 for three days and stop  30 tablet  0

## 2012-01-30 ENCOUNTER — Encounter (HOSPITAL_COMMUNITY): Payer: Medicare Other

## 2012-01-31 ENCOUNTER — Telehealth: Payer: Self-pay | Admitting: Critical Care Medicine

## 2012-01-31 MED ORDER — ONDANSETRON HCL 4 MG PO TABS: 4.0000 mg | ORAL_TABLET | Freq: Four times a day (QID) | ORAL | Status: DC | PRN

## 2012-01-31 NOTE — Telephone Encounter (Signed)
[name] states the pt started feeling better with Prednisone burst but did have some nausea and vomiting last night and still feels bad today, even though the nausea has stopped. The daughter is worried that the pt has not been eating or drinking very much and may be dehydrated. She is going to pick up some Gatorade for the pt today but wants PW recs. Pls advise.

## 2012-01-31 NOTE — Telephone Encounter (Signed)
Push po fluids Call in zofran 4mg  4 x daily as needed for nausea  #10  No refill IF worsens will need to go to ED Needs OV tomorrow with TP for recheck

## 2012-01-31 NOTE — Telephone Encounter (Signed)
I spoke with the pt daughter and advised of recs. She states the pt is no longer nauseous and she has not vomited any more. She states the pt has also eaten a small amount and is drinking well now. She does not feel the pt needs to come in for OV tomorrow. She prefers to wait to see if she continues to improve. She states if she gets worse again she will take her to ER. She does want rx sent in just in case. Rx sent. Carron Curie, CMA

## 2012-02-03 ENCOUNTER — Encounter (HOSPITAL_COMMUNITY): Payer: Self-pay | Admitting: Emergency Medicine

## 2012-02-03 ENCOUNTER — Emergency Department (HOSPITAL_COMMUNITY): Payer: Medicare Other

## 2012-02-03 ENCOUNTER — Inpatient Hospital Stay (HOSPITAL_COMMUNITY)
Admission: EM | Admit: 2012-02-03 | Discharge: 2012-02-17 | DRG: 871 | Disposition: A | Discharge status: Home Health | Payer: Medicare Other | Attending: Cardiology | Admitting: Cardiology

## 2012-02-03 DIAGNOSIS — D131 Benign neoplasm of stomach: Secondary | ICD-10-CM | POA: Diagnosis present

## 2012-02-03 DIAGNOSIS — B3781 Candidal esophagitis: Secondary | ICD-10-CM | POA: Diagnosis present

## 2012-02-03 DIAGNOSIS — J40 Bronchitis, not specified as acute or chronic: Secondary | ICD-10-CM | POA: Diagnosis present

## 2012-02-03 DIAGNOSIS — J8410 Pulmonary fibrosis, unspecified: Secondary | ICD-10-CM | POA: Diagnosis present

## 2012-02-03 DIAGNOSIS — N179 Acute kidney failure, unspecified: Secondary | ICD-10-CM | POA: Diagnosis present

## 2012-02-03 DIAGNOSIS — D61818 Other pancytopenia: Secondary | ICD-10-CM | POA: Diagnosis present

## 2012-02-03 DIAGNOSIS — D638 Anemia in other chronic diseases classified elsewhere: Secondary | ICD-10-CM | POA: Diagnosis present

## 2012-02-03 DIAGNOSIS — M329 Systemic lupus erythematosus, unspecified: Secondary | ICD-10-CM | POA: Diagnosis present

## 2012-02-03 DIAGNOSIS — I10 Essential (primary) hypertension: Secondary | ICD-10-CM | POA: Diagnosis present

## 2012-02-03 DIAGNOSIS — K219 Gastro-esophageal reflux disease without esophagitis: Secondary | ICD-10-CM | POA: Diagnosis present

## 2012-02-03 DIAGNOSIS — R578 Other shock: Secondary | ICD-10-CM | POA: Diagnosis present

## 2012-02-03 DIAGNOSIS — R0602 Shortness of breath: Secondary | ICD-10-CM

## 2012-02-03 DIAGNOSIS — A419 Sepsis, unspecified organism: Principal | ICD-10-CM | POA: Diagnosis present

## 2012-02-03 DIAGNOSIS — K922 Gastrointestinal hemorrhage, unspecified: Secondary | ICD-10-CM | POA: Diagnosis present

## 2012-02-03 DIAGNOSIS — K449 Diaphragmatic hernia without obstruction or gangrene: Secondary | ICD-10-CM | POA: Diagnosis present

## 2012-02-03 DIAGNOSIS — B952 Enterococcus as the cause of diseases classified elsewhere: Secondary | ICD-10-CM | POA: Diagnosis present

## 2012-02-03 DIAGNOSIS — Q391 Atresia of esophagus with tracheo-esophageal fistula: Secondary | ICD-10-CM

## 2012-02-03 DIAGNOSIS — J189 Pneumonia, unspecified organism: Secondary | ICD-10-CM | POA: Diagnosis present

## 2012-02-03 DIAGNOSIS — Q393 Congenital stenosis and stricture of esophagus: Secondary | ICD-10-CM

## 2012-02-03 DIAGNOSIS — E785 Hyperlipidemia, unspecified: Secondary | ICD-10-CM | POA: Diagnosis present

## 2012-02-03 DIAGNOSIS — Z9981 Dependence on supplemental oxygen: Secondary | ICD-10-CM

## 2012-02-03 DIAGNOSIS — R0902 Hypoxemia: Secondary | ICD-10-CM

## 2012-02-03 DIAGNOSIS — I509 Heart failure, unspecified: Secondary | ICD-10-CM | POA: Diagnosis present

## 2012-02-03 DIAGNOSIS — I2510 Atherosclerotic heart disease of native coronary artery without angina pectoris: Secondary | ICD-10-CM | POA: Diagnosis present

## 2012-02-03 DIAGNOSIS — Z951 Presence of aortocoronary bypass graft: Secondary | ICD-10-CM

## 2012-02-03 DIAGNOSIS — D62 Acute posthemorrhagic anemia: Secondary | ICD-10-CM

## 2012-02-03 DIAGNOSIS — I4891 Unspecified atrial fibrillation: Secondary | ICD-10-CM | POA: Diagnosis present

## 2012-02-03 DIAGNOSIS — I214 Non-ST elevation (NSTEMI) myocardial infarction: Secondary | ICD-10-CM | POA: Diagnosis present

## 2012-02-03 DIAGNOSIS — Z952 Presence of prosthetic heart valve: Secondary | ICD-10-CM

## 2012-02-03 DIAGNOSIS — N390 Urinary tract infection, site not specified: Secondary | ICD-10-CM | POA: Diagnosis present

## 2012-02-03 DIAGNOSIS — I252 Old myocardial infarction: Secondary | ICD-10-CM | POA: Diagnosis present

## 2012-02-03 DIAGNOSIS — R231 Pallor: Secondary | ICD-10-CM | POA: Diagnosis present

## 2012-02-03 LAB — COMPREHENSIVE METABOLIC PANEL
ALT: 13 U/L (ref 0–35)
ALT: 12 U/L (ref 0–35)
AST: 22 U/L (ref 0–37)
Alkaline Phosphatase: 80 U/L (ref 39–117)
Alkaline Phosphatase: 71 U/L (ref 39–117)
CO2: 25 mEq/L (ref 19–32)
Chloride: 100 mEq/L (ref 96–112)
Creatinine, Ser: 1.12 mg/dL — ABNORMAL HIGH (ref 0.50–1.10)
GFR calc Af Amer: 50 mL/min — ABNORMAL LOW (ref >90)
GFR calc non Af Amer: 48 mL/min — ABNORMAL LOW (ref >90)
Glucose, Bld: 114 mg/dL — ABNORMAL HIGH (ref 70–99)
Potassium: 3.7 mEq/L (ref 3.5–5.1)
Sodium: 135 mEq/L (ref 135–145)
Sodium: 134 mEq/L — ABNORMAL LOW (ref 135–145)
Total Bilirubin: 0.9 mg/dL (ref 0.3–1.2)
Total Protein: 6 g/dL (ref 6.0–8.3)

## 2012-02-03 LAB — URINALYSIS, ROUTINE W REFLEX MICROSCOPIC
Ketones, ur: 15 mg/dL — AB
Protein, ur: >300 mg/dL — AB
Urobilinogen, UA: 1 mg/dL (ref 0.0–1.0)

## 2012-02-03 LAB — CBC WITH DIFFERENTIAL/PLATELET
Basophils Absolute: 0 10*3/uL (ref 0.0–0.1)
Basophils Relative: 0 % (ref 0–1)
HCT: 32.1 % — ABNORMAL LOW (ref 36.0–46.0)
Hemoglobin: 9.5 g/dL — ABNORMAL LOW (ref 12.0–15.0)
Lymphocytes Relative: 5 % — ABNORMAL LOW (ref 12–46)
Lymphocytes Relative: 10 % — ABNORMAL LOW (ref 12–46)
Lymphs Abs: 0.9 10*3/uL (ref 0.7–4.0)
MCHC: 32.4 g/dL (ref 30.0–36.0)
Monocytes Absolute: 1.3 10*3/uL — ABNORMAL HIGH (ref 0.1–1.0)
Monocytes Relative: 11 % (ref 3–12)
Neutro Abs: 11.5 10*3/uL — ABNORMAL HIGH (ref 1.7–7.7)
Neutro Abs: 6.7 10*3/uL (ref 1.7–7.7)
Neutrophils Relative %: 78 % — ABNORMAL HIGH (ref 43–77)
RBC: 3.1 MIL/uL — ABNORMAL LOW (ref 3.87–5.11)
RDW: 13.8 % (ref 11.5–15.5)
WBC: 13.5 10*3/uL — ABNORMAL HIGH (ref 4.0–10.5)
WBC: 8.5 10*3/uL (ref 4.0–10.5)

## 2012-02-03 LAB — URINE MICROSCOPIC-ADD ON

## 2012-02-03 LAB — POCT I-STAT TROPONIN I: Troponin i, poc: 1.02 ng/mL (ref 0.00–0.08)

## 2012-02-03 LAB — PROTIME-INR: INR: 1.77 — ABNORMAL HIGH (ref 0.00–1.49)

## 2012-02-03 LAB — CK TOTAL AND CKMB (NOT AT ARMC): Total CK: 121 U/L (ref 7–177)

## 2012-02-03 LAB — APTT: aPTT: 48 seconds — ABNORMAL HIGH (ref 24–37)

## 2012-02-03 MED ORDER — IOHEXOL 350 MG/ML SOLN
80.0000 mL | Freq: Once | INTRAVENOUS | Status: AC | PRN
  Administered 2012-02-03: 80 mL via INTRAVENOUS

## 2012-02-03 MED ORDER — SULFASALAZINE 500 MG PO TABS
1000.0000 mg | ORAL_TABLET | Freq: Two times a day (BID) | ORAL | Status: DC
  Administered 2012-02-03 – 2012-02-17 (×28): 1000 mg via ORAL
  Filled 2012-02-03 (×29): qty 2

## 2012-02-03 MED ORDER — DEXTROSE 5 % IV SOLN
1.0000 g | Freq: Once | INTRAVENOUS | Status: AC
  Administered 2012-02-03: 1 g via INTRAVENOUS
  Filled 2012-02-03: qty 10

## 2012-02-03 MED ORDER — ONDANSETRON HCL 4 MG/2ML IJ SOLN
4.0000 mg | Freq: Four times a day (QID) | INTRAMUSCULAR | Status: DC | PRN
  Administered 2012-02-12: 4 mg via INTRAVENOUS

## 2012-02-03 MED ORDER — ACETAMINOPHEN 325 MG PO TABS: 650.0000 mg | ORAL_TABLET | ORAL | Status: DC

## 2012-02-03 MED ORDER — PREDNISONE 20 MG PO TABS
20.0000 mg | ORAL_TABLET | Freq: Every day | ORAL | Status: DC
  Filled 2012-02-03: qty 1

## 2012-02-03 MED ORDER — ATORVASTATIN CALCIUM 40 MG PO TABS
40.0000 mg | ORAL_TABLET | Freq: Every day | ORAL | Status: DC
  Administered 2012-02-04 – 2012-02-16 (×12): 40 mg via ORAL
  Filled 2012-02-03 (×14): qty 1

## 2012-02-03 MED ORDER — ONDANSETRON HCL 4 MG PO TABS
4.0000 mg | ORAL_TABLET | Freq: Four times a day (QID) | ORAL | Status: DC | PRN
  Administered 2012-02-12: 4 mg via ORAL
  Filled 2012-02-03: qty 1

## 2012-02-03 MED ORDER — ASPIRIN 81 MG PO CHEW
324.0000 mg | CHEWABLE_TABLET | ORAL | Status: AC
  Administered 2012-02-03: 324 mg via ORAL
  Filled 2012-02-03: qty 4

## 2012-02-03 MED ORDER — HEPARIN (PORCINE) IN NACL 100-0.45 UNIT/ML-% IJ SOLN
1200.0000 [IU]/h | INTRAMUSCULAR | Status: DC
  Administered 2012-02-03: 1200 [IU]/h via INTRAVENOUS
  Filled 2012-02-03 (×3): qty 250

## 2012-02-03 MED ORDER — SODIUM CHLORIDE 0.9 % IV SOLN: 1000.0000 mg | INTRAVENOUS | Status: DC

## 2012-02-03 MED ORDER — NITROGLYCERIN 0.4 MG SL SUBL: 0.4000 mg | SUBLINGUAL_TABLET | SUBLINGUAL | Status: DC | PRN

## 2012-02-03 MED ORDER — FERROUS SULFATE 325 (65 FE) MG PO TABS
325.0000 mg | ORAL_TABLET | Freq: Every day | ORAL | Status: DC
  Administered 2012-02-04 – 2012-02-17 (×13): 325 mg via ORAL
  Filled 2012-02-03 (×15): qty 1

## 2012-02-03 MED ORDER — ASPIRIN EC 81 MG PO TBEC
81.0000 mg | DELAYED_RELEASE_TABLET | Freq: Every day | ORAL | Status: DC
  Administered 2012-02-04 – 2012-02-11 (×7): 81 mg via ORAL
  Filled 2012-02-03 (×8): qty 1

## 2012-02-03 MED ORDER — SODIUM CHLORIDE 0.9 % IV SOLN: INTRAVENOUS | Status: DC

## 2012-02-03 MED ORDER — ACETAMINOPHEN 325 MG PO TABS
650.0000 mg | ORAL_TABLET | ORAL | Status: DC | PRN
  Administered 2012-02-05 – 2012-02-15 (×8): 650 mg via ORAL
  Filled 2012-02-03 (×8): qty 2

## 2012-02-03 MED ORDER — METOPROLOL TARTRATE 50 MG PO TABS
50.0000 mg | ORAL_TABLET | Freq: Two times a day (BID) | ORAL | Status: DC
  Administered 2012-02-03: 50 mg via ORAL
  Filled 2012-02-03: qty 1

## 2012-02-03 MED ORDER — DEXTROSE 5 % IV SOLN
1.0000 g | INTRAVENOUS | Status: DC
  Administered 2012-02-04: 1 g via INTRAVENOUS
  Filled 2012-02-03 (×2): qty 10

## 2012-02-03 MED ORDER — ROPINIROLE HCL 0.5 MG PO TABS
0.5000 mg | ORAL_TABLET | Freq: Two times a day (BID) | ORAL | Status: DC
  Administered 2012-02-03 – 2012-02-10 (×15): 0.5 mg via ORAL
  Filled 2012-02-03 (×17): qty 1

## 2012-02-03 MED ORDER — DEXTROSE 5 % IV SOLN
500.0000 mg | INTRAVENOUS | Status: DC
  Administered 2012-02-03 – 2012-02-05 (×2): 500 mg via INTRAVENOUS
  Filled 2012-02-03 (×3): qty 500

## 2012-02-03 MED ORDER — BUDESONIDE-FORMOTEROL FUMARATE 160-4.5 MCG/ACT IN AERO
2.0000 | INHALATION_SPRAY | Freq: Two times a day (BID) | RESPIRATORY_TRACT | Status: DC
  Administered 2012-02-04 – 2012-02-17 (×23): 2 via RESPIRATORY_TRACT
  Filled 2012-02-03: qty 6

## 2012-02-03 MED ORDER — ASPIRIN 300 MG RE SUPP
300.0000 mg | RECTAL | Status: AC
  Filled 2012-02-03: qty 1

## 2012-02-03 MED ORDER — PANTOPRAZOLE SODIUM 40 MG PO TBEC
40.0000 mg | DELAYED_RELEASE_TABLET | Freq: Two times a day (BID) | ORAL | Status: DC
  Administered 2012-02-04 – 2012-02-17 (×26): 40 mg via ORAL
  Filled 2012-02-03 (×25): qty 1

## 2012-02-03 MED ORDER — HYDROXYCHLOROQUINE SULFATE 200 MG PO TABS
200.0000 mg | ORAL_TABLET | Freq: Two times a day (BID) | ORAL | Status: DC
  Administered 2012-02-03 – 2012-02-17 (×27): 200 mg via ORAL
  Filled 2012-02-03 (×29): qty 1

## 2012-02-03 NOTE — ED Provider Notes (Signed)
History     CSN: [id number]  Arrival date & time 02/03/12  1502   First MD Initiated Contact with Patient 02/03/12 1511      Chief Complaint  Patient presents with  . Lupus    (Consider location/radiation/quality/duration/timing/severity/associated sxs/prior treatment) The history is provided by the patient and the EMS personnel. No language interpreter was used.  cc:  Here from home with c/o nausea and dizziness with some disorientation per daughter.  Patient not aware that she is disoriented.  Under Dr. Florene Route care for pulm fibrosis with recent prednisone taper due to increased SOB on 11/5.  Patient on home O2 x 1 week. Past 3 weeks of lupus flare up with frequent consults/visits with Dr. Delford Field for SOB.   Patient lives alone.  Unable to eat due to nausea today.  Unsure if she is on coumadin or not.  Admission in 1/13 for coumadin toxicity.  pmh listed below.    Past Medical History  Diagnosis Date  . Coronary artery disease   . Atrial fibrillation     Now NSR  . GERD (gastroesophageal reflux disease)   . Hyperlipidemia   . 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 Anemia   . Angina   . Shortness of breath     with activity  . Hypertension   . Blood transfusion   . Pneumonia   . H/O hiatal hernia   . Lupus     Past Surgical History  Procedure Date  . Cardiac catheterization   . Cardiac valve replacement     2006  . Coronary artery bypass graft   . Givens capsule study 04/09/2011    Procedure: GIVENS CAPSULE STUDY;  Surgeon: Theda Belfast, MD;  Location: [facility] ENDOSCOPY;  Service: Endoscopy;  Laterality: N/A;    No family history on file.  History  Substance Use Topics  . Smoking status: Never Smoker   . Smokeless tobacco: Never Used  . Alcohol Use: No    OB History    Grav Para Term Preterm Abortions TAB SAB Ect Mult Living                  Review of Systems  Constitutional: Positive for chills and fatigue.  HENT: Negative.   Eyes:  Negative.   Respiratory: Positive for cough and shortness of breath.   Cardiovascular: Negative.  Negative for chest pain and leg swelling.  Gastrointestinal: Positive for nausea and diarrhea. Negative for vomiting and abdominal pain.  Genitourinary: Negative for dysuria, frequency and hematuria.  Musculoskeletal: Positive for back pain. Negative for gait problem.  Neurological: Positive for dizziness. Negative for tremors, light-headedness and headaches.       Confusion per daughter  Psychiatric/Behavioral: Negative.   All other systems reviewed and are negative.    Allergies  Codeine and Codeine phosphate  Home Medications   Current Outpatient Rx  Name  Route  Sig  Dispense  Refill  . AMIODARONE HCL 200 MG PO TABS   Oral   Take 1 tablet (200 mg total) by mouth 2 (two) times daily.   60 tablet   3   . APIXABAN 5 MG PO TABS   Oral   Take 5 mg by mouth 2 (two) times daily.         Eston Mould IV   Intravenous   Inject into the vein. Once monthly          . BENICAR HCT 40-12.5 MG PO  TABS   Oral   Take 1 tablet by mouth daily.          . BUDESONIDE-FORMOTEROL FUMARATE 160-4.5 MCG/ACT IN AERO   Inhalation   Inhale 2 puffs into the lungs 2 (two) times daily.   1 Inhaler   12   . FERROUS SULFATE 325 (65 FE) MG PO TABS   Oral   Take 325 mg by mouth daily with breakfast.         . FUROSEMIDE 40 MG PO TABS   Oral   Take 40 mg by mouth daily.          Marland Kitchen HYDROXYCHLOROQUINE SULFATE 200 MG PO TABS   Oral   Take 200 mg by mouth 2 (two) times daily.          Marland Kitchen KLOR-CON M20 20 MEQ PO TBCR   Oral   Take 20 mEq by mouth daily.          Marland Kitchen METOPROLOL TARTRATE 50 MG PO TABS   Oral   Take 50 mg by mouth 2 (two) times daily.           Marland Kitchen ONDANSETRON HCL 4 MG PO TABS   Oral   Take 1 tablet (4 mg total) by mouth 4 (four) times daily as needed for nausea.   10 tablet   0   . PANTOPRAZOLE SODIUM 40 MG PO TBEC      TAKE 1 TABLET BY MOUTH ONCE DAILY   90  tablet   1   . PREDNISONE 5 MG PO TABS   Oral   Take 5 mg by mouth daily.         Marland Kitchen ROPINIROLE HCL 1 MG PO TABS      Daily at bedtime         . ROSUVASTATIN CALCIUM 10 MG PO TABS   Oral   Take 10 mg by mouth daily.           . SULFASALAZINE 500 MG PO TABS   Oral   Take 1,000 mg by mouth 2 (two) times daily. 2 tablets twice daily         . PREDNISONE 10 MG PO TABS      Take 4 for four days 3 for four days 2 for four days then two daily and stay   100 tablet   6     There were no vitals taken for this visit.  Physical Exam  Nursing note and vitals reviewed. Constitutional: She is oriented to person, place, and time. She appears well-developed and well-nourished.  HENT:  Head: Normocephalic and atraumatic.  Eyes: Conjunctivae normal and EOM are normal. Pupils are equal, round, and reactive to light.  Neck: Normal range of motion. Neck supple.  Cardiovascular: Normal rate.   Pulmonary/Chest: Accessory muscle usage present. Tachypnea noted. She is in respiratory distress. She has no wheezes. She has rales in the right lower field.  Abdominal: Soft. Bowel sounds are normal. She exhibits no distension. There is no tenderness.  Musculoskeletal: Normal range of motion. She exhibits no edema and no tenderness.  Neurological: She is alert and oriented to person, place, and time. She has normal reflexes. No cranial nerve deficit.  Skin: Skin is warm and dry.  Psychiatric: She has a normal mood and affect.    ED Course  Procedures (including critical care time)   Labs Reviewed  CBC WITH DIFFERENTIAL  COMPREHENSIVE METABOLIC PANEL  URINALYSIS, ROUTINE W REFLEX MICROSCOPIC   No results found.  No diagnosis found.    MDM   Date: 02/03/2012  Rate: 107  Rhythm: atrial fibrillation  QRS Axis:  Right axis deviation  Intervals: normal  ST/T Wave abnormalities: normal  Conduction Disutrbances:none  Narrative Interpretation: St depression last ekg not on this  one  Old EKG Reviewed: changes noted  71 year old female past medical history of lupus coming in today with nausea increased shortness of breath and confusion per daughter is a positive troponin of uncertain etiology areas CT of the chest pending. Chest x-ray that was reviewed, she'll shows no acute infiltrate or pleural effusion. EKG shows no changes except there was ST depression on the last EKG in comparison: no ST depression on this EKG. Patient denies chest pain. White blood count was 13.5. Troponin was 1.02. INR was 1.62. Patient will be admitted to the hospital for hypoxia, shortness of breath, and a positive troponin of Dr. Particia Jasper.  Labs Reviewed  CBC WITH DIFFERENTIAL - Abnormal; Notable for the following:    WBC 13.5 (*)     RBC 3.38 (*)     Hemoglobin 10.4 (*)     HCT 32.1 (*)     Neutrophils Relative 85 (*)     Neutro Abs 11.5 (*)     Lymphocytes Relative 5 (*)     Monocytes Absolute 1.3 (*)     All other components within normal limits  COMPREHENSIVE METABOLIC PANEL - Abnormal; Notable for the following:    Glucose, Bld 108 (*)     Creatinine, Ser 1.12 (*)     Albumin 2.7 (*)     GFR calc non Af Amer 48 (*)     GFR calc Af Amer 55 (*)     All other components within normal limits  PROTIME-INR - Abnormal; Notable for the following:    Prothrombin Time 18.7 (*)     INR 1.62 (*)     All other components within normal limits  POCT I-STAT TROPONIN I - Abnormal; Notable for the following:    Troponin i, poc 1.02 (*)     All other components within normal limits  URINALYSIS, ROUTINE W REFLEX MICROSCOPIC         Remi Haggard, NP 02/03/12 1737

## 2012-02-03 NOTE — ED Provider Notes (Signed)
Medical screening examination/treatment/procedure(s) were conducted as a shared visit with non-physician practitioner(s) and myself.  I personally evaluated the patient during the encounter  Pt with multiple medical problems has had increasing SOB, confusion, dizziness at home but no CP. Has been on Prednisone recently for same. Arrives with fever, tachycardia, tachypnea and hypoxia. CXR initially neg for infiltrate. Troponin positive, awaiting CT angio to rule out PE, although she is currently anticoagulated with Eliquis. Dr. Sharyn Lull to see in the ED.   Charles B. Bernette Mayers, MD 02/03/12 1750

## 2012-02-03 NOTE — Progress Notes (Addendum)
ANTICOAGULATION CONSULT NOTE - Initial Consult  Pharmacy Consult for Heparin Indication: pulmonary embolus  Allergies  Allergen Reactions  . Codeine     REACTION: nausea  . Codeine Phosphate     REACTION: unspecified    Patient Measurements:   Heparin Dosing Weight: 85.5 kg   Vital Signs: Temp: 100.7 F (38.2 C) (11/10 1535) Temp src: Oral (11/10 1535) BP: 116/58 mmHg (11/10 1535) Pulse Rate: 106  (11/10 1535)  Labs:  [facility] 02/03/12 1541 02/03/12 1526  HGB -- 10.4*  HCT -- 32.1*  PLT -- 157  APTT -- --  LABPROT 18.7* --  INR 1.62* --  HEPARINUNFRC -- --  CREATININE -- 1.12*  CKTOTAL -- --  CKMB -- --  TROPONINI -- --    The CrCl is unknown because both a height and weight (above a minimum accepted value) are required for this calculation.   Medical History: Past Medical History  Diagnosis Date  . Coronary artery disease   . Atrial fibrillation     Now NSR  . GERD (gastroesophageal reflux disease)   . Hyperlipidemia   . 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 Anemia   . Angina   . Shortness of breath     with activity  . Hypertension   . Blood transfusion   . Pneumonia   . H/O hiatal hernia   . Lupus     Assessment: N/V x several days, disoreintation. C/o lupus flare. Accessory muscle usage present. Tachypnea noted. She is in respiratory distress. Patient in afib. Baseline Hgb low 10.4. Troponin positive.  Anticoag: Baseline INR 1.62 not unexpected on Apixaban.  ID: Recent txt 10/13 for PNA with Avelox. Temp 100.7 and WBC 13.5  Goal of Therapy:  Heparin level 0.3-0.7 units/ml Monitor platelets by anticoagulation protocol: Yes   Plan:  Spoke with Dr. Bernette Mayers in ER regarding pt already on therapeutic anticoagulation apixaban which can also skew INR and heparin levels. Could we change to Lovenox? Instructed by Dr. Bernette Mayers to wait for cardiology consult.  1900 Spoke with Dr. Levie Heritage who does want to start IV heparin  with no bolus: start 1200 units/hr now 1920.  Merilynn Finland, [employer] 02/03/2012,4:56 PM

## 2012-02-03 NOTE — H&P (Signed)
[name] is an 71 y.o. female.   Chief Complaint: Progressive shortness of breath associated with generalized weakness nausea and low-grade fever HPI: Patient is 71 year old female with past medical history significant for multiple medical problems i.e. 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 Patient states she was her needed for pneumonia approximately 4 weeks ago and subsequently was treated with steroids taper her with initial improvement in her symptoms and for last 2 days has progressive increasing shortness of breath associated with generalized weakness and nausea. Patient complains of dry cough denies any fever or chills but was noted to have low-grade temp. Patient also was noted to have elevated troponin I. Patient denies any chest pain. Denies any history of PND orthopnea leg swelling. Denies any palpitation lightheadedness or syncopal episode.  Past Medical History  Diagnosis Date  . Coronary artery disease   . Atrial fibrillation     Now NSR  . GERD (gastroesophageal reflux disease)   . Hyperlipidemia   . 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 Anemia   . Angina   . Shortness of breath     with activity  . Hypertension   . Blood transfusion   . Pneumonia   . H/O hiatal hernia   . Lupus     Past Surgical History  Procedure Date  . Cardiac catheterization   . Cardiac valve replacement     2006  . Coronary artery bypass graft   . Givens capsule study 04/09/2011    Procedure: GIVENS CAPSULE STUDY;  Surgeon: Theda Belfast, MD;  Location: [facility] ENDOSCOPY;  Service:  Endoscopy;  Laterality: N/A;    No family history on file. Social History:  reports that she has never smoked. She has never used smokeless tobacco. She reports that she does not drink alcohol. Her drug history not on file.  Allergies:  Allergies  Allergen Reactions  . Codeine     REACTION: nausea  . Codeine Phosphate     REACTION: unspecified     (Not in a hospital admission)  Results for orders placed during the hospital encounter of 02/03/12 (from the past 48 hour(s))  CBC WITH DIFFERENTIAL     Status: Abnormal   Collection Time   02/03/12  3:26 PM      Component Value Range Comment   WBC 13.5 (*) 4.0 - 10.5 K/uL    RBC 3.38 (*) 3.87 - 5.11 MIL/uL    Hemoglobin 10.4 (*) 12.0 - 15.0 g/dL    HCT 91.4 (*) 78.2 - 46.0 %    MCV 95.0  78.0 - 100.0 fL    MCH 30.8  26.0 - 34.0 pg    MCHC 32.4  30.0 - 36.0 g/dL    RDW 95.6  21.3 - 08.6 %    Platelets 157  150 - 400 K/uL    Neutrophils Relative 85 (*) 43 - 77 %    Neutro Abs 11.5 (*) 1.7 - 7.7 K/uL    Lymphocytes Relative 5 (*) 12 - 46 %    Lymphs Abs 0.7  0.7 - 4.0 K/uL    Monocytes Relative 10  3 - 12 %  Monocytes Absolute 1.3 (*) 0.1 - 1.0 K/uL    Eosinophils Relative 0  0 - 5 %    Eosinophils Absolute 0.0  0.0 - 0.7 K/uL    Basophils Relative 0  0 - 1 %    Basophils Absolute 0.0  0.0 - 0.1 K/uL   COMPREHENSIVE METABOLIC PANEL     Status: Abnormal   Collection Time   02/03/12  3:26 PM      Component Value Range Comment   Sodium 135  135 - 145 mEq/L    Potassium 3.5  3.5 - 5.1 mEq/L    Chloride 100  96 - 112 mEq/L    CO2 25  19 - 32 mEq/L    Glucose, Bld 108 (*) 70 - 99 mg/dL    BUN 20  6 - 23 mg/dL    Creatinine, Ser 4.09 (*) 0.50 - 1.10 mg/dL    Calcium 8.8  8.4 - 81.1 mg/dL    Total Protein 6.3  6.0 - 8.3 g/dL    Albumin 2.7 (*) 3.5 - 5.2 g/dL    AST 22  0 - 37 U/L    ALT 13  0 - 35 U/L    Alkaline Phosphatase 80  39 - 117 U/L    Total Bilirubin 0.9  0.3 - 1.2 mg/dL    GFR calc non Af Amer 48 (*) >90 mL/min     GFR calc Af Amer 55 (*) >90 mL/min   PROTIME-INR     Status: Abnormal   Collection Time   02/03/12  3:41 PM      Component Value Range Comment   Prothrombin Time 18.7 (*) 11.6 - 15.2 seconds    INR 1.62 (*) 0.00 - 1.49   POCT I-STAT TROPONIN I     Status: Abnormal   Collection Time   02/03/12  3:48 PM      Component Value Range Comment   Troponin i, poc 1.02 (*) 0.00 - 0.08 ng/mL    Comment NOTIFIED PHYSICIAN      Comment 3            URINALYSIS, ROUTINE W REFLEX MICROSCOPIC     Status: Abnormal   Collection Time   02/03/12  5:30 PM      Component Value Range Comment   Color, Urine RED (*) YELLOW BIOCHEMICALS MAY BE AFFECTED BY COLOR   APPearance TURBID (*) CLEAR    Specific Gravity, Urine >1.046 (*) 1.005 - 1.030    pH 5.5  5.0 - 8.0    Glucose, UA NEGATIVE  NEGATIVE mg/dL    Hgb urine dipstick LARGE (*) NEGATIVE    Bilirubin Urine LARGE (*) NEGATIVE    Ketones, ur 15 (*) NEGATIVE mg/dL    Protein, ur >914 (*) NEGATIVE mg/dL    Urobilinogen, UA 1.0  0.0 - 1.0 mg/dL    Nitrite POSITIVE (*) NEGATIVE    Leukocytes, UA MODERATE (*) NEGATIVE   URINE MICROSCOPIC-ADD ON     Status: Abnormal   Collection Time   02/03/12  5:30 PM      Component Value Range Comment   Squamous Epithelial / LPF FEW (*) RARE    WBC, UA 21-50  <3 WBC/hpf    RBC / HPF TOO NUMEROUS TO COUNT  <3 RBC/hpf    Bacteria, UA RARE  RARE    Ct Angio Chest Pe W/cm &/or Wo Cm  02/03/2012  *RADIOLOGY REPORT*  Clinical Data: Per EMS: Pt from home, c/o lupus flare up, nausea  with dry heaves, dizziness, periods of disorientation (per daughter). Prednisone changes due to flare ups and remissions for last 3 weeks, Chest pain.  Hx: HTN, CAD, A-fib, Angina, Lupus, PNA, CABG, Pulmonary fibrosis  CT ANGIOGRAPHY CHEST  Technique:  Multidetector CT imaging of the chest using the standard protocol during bolus administration of intravenous contrast. Multiplanar reconstructed images including MIPs were obtained and reviewed to  evaluate the vascular anatomy.  Contrast: 80mL OMNIPAQUE IOHEXOL 350 MG/ML SOLN  Comparison: CT angiogram of the chest dated 01/11/2012  Findings: No filling defect is identified in the pulmonary arterial tree to suggest pulmonary embolus.  Right upper paratracheal node, 1.1 cm in short axis (formerly the same).  Additional scattered paratracheal nodes are present. Precarinal node 0.8 cm in short axis.  Coronary atherosclerosis noted.  Calcified mitral valve.  Four- chamber cardiomegaly.  Flattened interventricular septum.  Mildly prominent main pulmonary artery, 4 cm in diameter.  Reflux of contrast into the upper portion of the IVC noted.  A similar mosaic attenuation in the lungs.  Suspected emphysema. Continued peripheral band of apparent atelectasis in the right upper lobe with scarring or atelectasis in the right middle lobe. Stable scarring in the left upper lobe.  Mild increase in nodular atelectasis in the superior segment left lower lobe as on image 116 of series 9 (new finding compared to 01/11/2012 stable 3 mm nodule, right lower lobe, image 53 of series 5.  Prior ground-glass nodule in the right lower lobe shown on the prior exam has resolved.  IMPRESSION:  1.  No embolus. 2.  Cardiomegaly with suspected elevated right heart pressures given the flattened interventricular septum and prominent main pulmonary artery. 3.  Mild stable mediastinal adenopathy, etiology uncertain. 4.  Emphysema 5. Chronic scattered mosaic attenuation in the lungs, stable, possibly from air trapping or hypersensitivity pneumonitis. 6.  The 3 mm nodule in the right lower lobe is stable back through 05/04/2011.  This is statistically highly likely to be benign. 7.  Mitral annular calcification.   Original Report Authenticated By: Gaylyn Rong, M.D.    Dg Chest Port 1 View  02/03/2012  *RADIOLOGY REPORT*  Clinical Data: Shortness of breath  PORTABLE CHEST - 1 VIEW  Comparison: 01/11/2012  Findings: Cardiomegaly again  noted.  Stable chronic mild interstitial prominence.  No acute infiltrate or pleural effusion. Mild elevation of the right hemidiaphragm again noted.  Stable right basilar scarring.  IMPRESSION:  No active disease.  No significant change.   Original Report Authenticated By: Natasha Mead, M.D.     Review of Systems  Constitutional: Positive for malaise/fatigue. Negative for fever and chills.  HENT: Negative for hearing loss and congestion.   Eyes: Negative for blurred vision.  Respiratory: Positive for cough and shortness of breath. Negative for hemoptysis and sputum production.   Cardiovascular: Negative for chest pain, palpitations, orthopnea and claudication.  Gastrointestinal: Positive for nausea. Negative for vomiting and abdominal pain.  Genitourinary: Negative for dysuria and urgency.  Neurological: Positive for dizziness and weakness. Negative for headaches.    Blood pressure 116/58, pulse 106, temperature 100.7 F (38.2 C), temperature source Oral, resp. rate 28, height 5' 6.93" (1.7 m), weight 98.4 kg (216 lb 14.9 oz), SpO2 94.00%. Physical Exam  Constitutional: She is oriented to person, place, and time. She appears well-developed and well-nourished.  HENT:  Head: Normocephalic and atraumatic.  Nose: Nose normal.  Mouth/Throat: No oropharyngeal exudate.  Eyes: Conjunctivae normal and EOM are normal. Pupils are equal, round, and reactive to light.  Left eye exhibits no discharge. No scleral icterus.  Neck: Normal range of motion. Neck supple. No JVD present. No tracheal deviation present. No thyromegaly present.  Cardiovascular: Exam reveals no friction rub.        Irregularly irregular S1 and S2 soft there is soft systolic murmur no S3 gallop  Respiratory: Effort normal and breath sounds normal. No respiratory distress. She has no wheezes. She has no rales.  GI: Soft. Bowel sounds are normal. She exhibits distension. There is no tenderness. There is no rebound.  Musculoskeletal: She  exhibits no edema and no tenderness.  Neurological: She is alert and oriented to person, place, and time.     Assessment/Plan Probable small non-Q-wave myocardial infarction with atypical presentation Bronchitis rule out pneumonia Mixed connective tissue disease with pulmonary fibrosis on chronic steroids and home O2 History of rheumatoid arthritis Coronary artery disease/history of aortic stenosis status post CABG and bioprosthetic aVR line atrial fibrillation Hypertension Hypercholesteremia Morbid obesity Chronic atrial fibrillation History of pleuropericarditis in the past  History of GI bleeding in the past Plan As per orders  Brycelynn Stampley N 02/03/2012, 6:45 PM

## 2012-02-03 NOTE — ED Notes (Signed)
Per EMS: Pt from home, c/o lupus flare up, nausea with dry heaves, dizziness, periods of disorientation (per daughter). Prednisone changes due to flare ups and remissions for last 3 weeks.

## 2012-02-04 ENCOUNTER — Encounter (HOSPITAL_COMMUNITY): Payer: Self-pay | Admitting: *Deleted

## 2012-02-04 DIAGNOSIS — I252 Old myocardial infarction: Secondary | ICD-10-CM | POA: Diagnosis present

## 2012-02-04 DIAGNOSIS — I214 Non-ST elevation (NSTEMI) myocardial infarction: Secondary | ICD-10-CM

## 2012-02-04 DIAGNOSIS — I959 Hypotension, unspecified: Secondary | ICD-10-CM

## 2012-02-04 DIAGNOSIS — J984 Other disorders of lung: Secondary | ICD-10-CM

## 2012-02-04 LAB — BASIC METABOLIC PANEL
BUN: 22 mg/dL (ref 6–23)
Chloride: 100 mEq/L (ref 96–112)
Creatinine, Ser: 1.4 mg/dL — ABNORMAL HIGH (ref 0.50–1.10)
GFR calc Af Amer: 42 mL/min — ABNORMAL LOW (ref >90)
GFR calc non Af Amer: 36 mL/min — ABNORMAL LOW (ref >90)
Potassium: 3.9 mEq/L (ref 3.5–5.1)

## 2012-02-04 LAB — CK TOTAL AND CKMB (NOT AT ARMC)
CK, MB: 5.1 ng/mL — ABNORMAL HIGH (ref 0.3–4.0)
CK, MB: 4.6 ng/mL — ABNORMAL HIGH (ref 0.3–4.0)
CK, MB: 4.1 ng/mL — ABNORMAL HIGH (ref 0.3–4.0)
CK, MB: 3.4 ng/mL (ref 0.3–4.0)
Relative Index: INVALID (ref 0.0–2.5)
Relative Index: INVALID (ref 0.0–2.5)
Relative Index: 2.3 (ref 0.0–2.5)
Total CK: 79 U/L (ref 7–177)
Total CK: 63 U/L (ref 7–177)
Total CK: 145 U/L (ref 7–177)

## 2012-02-04 LAB — CBC
HCT: 31.7 % — ABNORMAL LOW (ref 36.0–46.0)
MCHC: 31.9 g/dL (ref 30.0–36.0)
RDW: 13.9 % (ref 11.5–15.5)
WBC: 6.8 10*3/uL (ref 4.0–10.5)

## 2012-02-04 LAB — LIPID PANEL
Cholesterol: 88 mg/dL (ref 0–200)
HDL: 28 mg/dL — ABNORMAL LOW (ref >39)
LDL Cholesterol: 48 mg/dL (ref 0–99)
Triglycerides: 61 mg/dL (ref <150)

## 2012-02-04 LAB — HEPARIN LEVEL (UNFRACTIONATED): Heparin Unfractionated: 1.87 IU/mL — ABNORMAL HIGH (ref 0.30–0.70)

## 2012-02-04 MED ORDER — SODIUM CHLORIDE 0.9 % IV BOLUS (SEPSIS)
250.0000 mL | Freq: Once | INTRAVENOUS | Status: AC
Start: 2012-02-04 — End: 2012-02-04
  Administered 2012-02-04: 250 mL via INTRAVENOUS

## 2012-02-04 MED ORDER — HEPARIN (PORCINE) IN NACL 100-0.45 UNIT/ML-% IJ SOLN
1750.0000 [IU]/h | INTRAMUSCULAR | Status: DC
  Administered 2012-02-05: 1650 [IU]/h via INTRAVENOUS
  Administered 2012-02-06: 1750 [IU]/h via INTRAVENOUS
  Filled 2012-02-04 (×4): qty 250

## 2012-02-04 MED ORDER — PREDNISONE 50 MG PO TABS
60.0000 mg | ORAL_TABLET | Freq: Every day | ORAL | Status: DC
  Administered 2012-02-04 – 2012-02-08 (×5): 60 mg via ORAL
  Filled 2012-02-04 (×6): qty 1

## 2012-02-04 MED ORDER — METOPROLOL TARTRATE 25 MG PO TABS
25.0000 mg | ORAL_TABLET | Freq: Two times a day (BID) | ORAL | Status: DC
  Administered 2012-02-04 – 2012-02-12 (×17): 25 mg via ORAL
  Filled 2012-02-04 (×20): qty 1

## 2012-02-04 MED ORDER — SODIUM CHLORIDE 0.9 % IV BOLUS (SEPSIS)
500.0000 mL | Freq: Once | INTRAVENOUS | Status: AC
  Administered 2012-02-04: 500 mL via INTRAVENOUS

## 2012-02-04 NOTE — Progress Notes (Signed)
ANTICOAGULATION CONSULT NOTE - Follow Up Consult  Pharmacy Consult for heparin Indication: ACS  Labs:  Basename 02/04/12 0230 02/03/12 2155 02/03/12 2139 02/03/12 1541 02/03/12 1526  HGB 10.1* 9.5* -- -- --  HCT 31.7* 29.3* -- -- 32.1*  PLT 123* 132* -- -- 157  APTT 60* 48* -- -- --  LABPROT 20.2* 20.0* -- 18.7* --  INR 1.79* 1.77* -- 1.62* --  HEPARINUNFRC 1.87* -- -- -- --  CREATININE 1.40* 1.22* -- -- 1.12*  CKTOTAL 119 -- 121 -- --  CKMB 5.1* -- 4.9* -- --  TROPONINI 1.32* -- 1.40* -- --    Assessment  71yo female shows high heparin level for NSTEMI and aPTT is below goal. Patient noted on apixiban which influences Xa levels and current Xa of 1.87 does does not allow any meaningful interpretation so will need to continue with aPTT monitoring.  Goal APTT= 66-102  Plan -Increase heparin to 1400 units/hr -aPTT in 6 hrs  Harland German, Pharm D 02/04/2012 8:10 AM

## 2012-02-04 NOTE — Care Management Note (Addendum)
    Page 1 of 2   02/07/2012     2:07:25 PM   CARE MANAGEMENT NOTE 02/07/2012  Patient:  [name],[name]   Account Number:  [id number]  Date Initiated:  02/04/2012  Documentation initiated by:  Junius Creamer  Subjective/Objective Assessment:   adm w mi     Action/Plan:   lives alone, pcp dr Rinaldo Cloud   Anticipated DC Date:     Anticipated DC Plan:  HOME W HOME HEALTH SERVICES      DC Planning Services  CM consult      PAC Choice  DURABLE MEDICAL EQUIPMENT  HOME HEALTH   Choice offered to / List presented to:  [name]   DME arranged  IV PUMP/EQUIPMENT      DME agency  [facility]     [facility] arranged  HH-1 RN      [facility] agency  [facility]   Status of service:   Medicare Important Message given?   (If response is "NO", the following Medicare IM given date fields will be blank) Date Medicare IM given:   Date Additional Medicare IM given:    Discharge Disposition:  HOME W HOME HEALTH SERVICES  Per UR Regulation:  Reviewed for med. necessity/level of care/duration of stay  If discussed at Long Length of Stay Meetings, dates discussed:    Comments:  11/14 14:05p debbie Damali Broadfoot rn,bsn spoke w pt and gave her hhc agency list. she plans to go to her daughter's at disch. she's aware she will need home iv antibiotics. she is familiar w [facility] and will use them for hhc and iv antibotics. ref to donna w [facility].  11/11 13:45p debbie Levonia Wolfley rn,bsn [phone]

## 2012-02-04 NOTE — Progress Notes (Signed)
Pt up to [facility]; states she has to have bowel movement; pt diaphretic, pale; alert and oriented; able to answer all questions correctly; pt had small bm; assisted x2 back to bed; Dr. Sharyn Lull paged

## 2012-02-04 NOTE — Progress Notes (Signed)
Dr. Sharyn Lull returned paged; updated; new orders given for saline bolus; Dr. Frederico Hamman camera into room to give assistance; pt remains responsive; EKG obtained and faxed to [facility] for review; will continue to monitor pt closely; BP 74/43; bolus started

## 2012-02-04 NOTE — Progress Notes (Signed)
ANTICOAGULATION CONSULT NOTE - Follow Up Consult  Pharmacy Consult for heparin Indication: pulmonary embolus  Labs:  Basename 02/04/12 0230 02/03/12 2155 02/03/12 2139 02/03/12 1541 02/03/12 1526  HGB 10.1* 9.5* -- -- --  HCT 31.7* 29.3* -- -- 32.1*  PLT 123* 132* -- -- 157  APTT 60* 48* -- -- --  LABPROT 20.2* 20.0* -- 18.7* --  INR 1.79* 1.77* -- 1.62* --  HEPARINUNFRC 1.87* -- -- -- --  CREATININE -- 1.22* -- -- 1.12*  CKTOTAL -- -- 121 -- --  CKMB -- -- 4.9* -- --  TROPONINI -- -- 1.40* -- --    Assessment/Plan:  71yo female shows high heparin level for PE though pt has been on apixaban which can skew level; aPTT within goal range.  Will continue gtt and confirm with additional PTT.  Colleen Can PharmD BCPS 02/04/2012,3:31 AM

## 2012-02-04 NOTE — Progress Notes (Signed)
ANTICOAGULATION CONSULT NOTE - Follow Up Consult  Pharmacy Consult for heparin Indication: ACS  Labs:  Basename 02/04/12 1737 02/04/12 1503 02/04/12 0900 02/04/12 0230 02/03/12 2155 02/03/12 2139 02/03/12 1541 02/03/12 1526  HGB -- -- -- 10.1* 9.5* -- -- --  HCT -- -- -- 31.7* 29.3* -- -- 32.1*  PLT -- -- -- 123* 132* -- -- 157  APTT 54* -- -- 60* 48* -- -- --  LABPROT -- -- -- 20.2* 20.0* -- 18.7* --  INR -- -- -- 1.79* 1.77* -- 1.62* --  HEPARINUNFRC 0.82* -- -- 1.87* -- -- -- --  CREATININE -- -- -- 1.40* 1.22* -- -- 1.12*  CKTOTAL -- 63 79 119 -- -- -- --  CKMB -- 4.1* 4.6* 5.1* -- -- -- --  TROPONINI -- -- 1.03* 1.32* -- 1.40* -- --    Assessment  71yo female shows high heparin level for NSTEMI and aPTT is below goal at 54 seconds.   Patient noted on apixiban which influences Xa levels.  Current Xa of 0.82 is trending down, but will continue with aptt monitoring Goal APTT= 66-102  Plan -Increase heparin to 1650 units / hr -aPTT in 6 hrs  Okey Regal, PharmD [phone]   02/04/2012 6:45 PM

## 2012-02-04 NOTE — Consult Note (Signed)
Name: [name] MRN: [id number] DOB: Jan 01, 1941    LOS: 1  Referring Provider:  Dr. Sharyn Lull Reason for Referral:  hypotension  PULMONARY / CRITICAL CARE MEDICINE  HPI:  [name] is a 71 y/o woman with past medical history of RA and mixed connective tissue disease who is followed by Dr. Delford Field for associated lung disease.  She was admitted to the cardiology service with an elevated troponin and EKG with non-specific T wave changes after presenting to the ER with progressive shortness of breath and generalized weakness.  She was started on the ACS protocol and after a dose of metoprolol her blood pressure decreased to systolics in the 70s.  She was given 500cc of fluid with improvement in her BP.  Of note she has had a CT scan which showed mosaicism, this has been seen on previous imaging. She also has a history of CAD and A-fib for which she is on amiodarone. In the ED a UA was suspicious for UTI and she was started on ceftriaxone.  She is on a biologic as well as plaquenil for her RA.  She was recently treated for pneumonia with antibiotics and steroids.  She worsened after steroids were tapered and was put on an increased dose at her last clinic visit with Dr. Delford Field.  The has recently been tapered again to 20mg  prednisone daily.  Past Medical History  Diagnosis Date  . Coronary artery disease   . Atrial fibrillation     Now NSR  . GERD (gastroesophageal reflux disease)   . Hyperlipidemia   . 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 Anemia   . Angina   . Shortness of breath     with activity  . Hypertension   . Blood transfusion   . Pneumonia   . H/O hiatal hernia   . Lupus    Past Surgical History  Procedure Date  . Cardiac catheterization   . Cardiac valve replacement     2006  . Coronary artery bypass graft   . Givens capsule study 04/09/2011    Procedure: GIVENS CAPSULE STUDY;  Surgeon: Theda Belfast, MD;  Location: [facility] ENDOSCOPY;  Service:  Endoscopy;  Laterality: N/A;   Prior to Admission medications   Medication Sig Start Date End Date Taking? Authorizing Provider  amiodarone (PACERONE) 200 MG tablet Take 1 tablet (200 mg total) by mouth 2 (two) times daily. 04/15/11 04/14/12 Yes [name], MD  apixaban (ELIQUIS) 5 MG TABS tablet Take 5 mg by mouth 2 (two) times daily.   Yes Historical Provider, MD  Belimumab (BENLYSTA IV) Inject into the vein. Once monthly   Yes Historical Provider, MD  BENICAR HCT 40-12.5 MG per tablet Take 1 tablet by mouth daily.    Yes Historical Provider, MD  budesonide-formoterol (SYMBICORT) 160-4.5 MCG/ACT inhaler Inhale 2 puffs into the lungs 2 (two) times daily. 01/11/12  Yes Storm Frisk, MD  ferrous sulfate 325 (65 FE) MG tablet Take 325 mg by mouth daily with breakfast.   Yes Historical Provider, MD  furosemide (LASIX) 40 MG tablet Take 40 mg by mouth daily.    Yes Historical Provider, MD  hydroxychloroquine (PLAQUENIL) 200 MG tablet Take 200 mg by mouth 2 (two) times daily.    Yes Historical Provider, MD  KLOR-CON M20 20 MEQ tablet Take 20 mEq by mouth daily.    Yes Historical Provider, MD  metoprolol (LOPRESSOR) 50 MG tablet Take 50 mg by mouth  2 (two) times daily.     Yes Historical Provider, MD  ondansetron (ZOFRAN) 4 MG tablet Take 1 tablet (4 mg total) by mouth 4 (four) times daily as needed for nausea. 01/31/12  Yes Storm Frisk, MD  pantoprazole (PROTONIX) 40 MG tablet TAKE 1 TABLET BY MOUTH ONCE DAILY 11/05/11  Yes Storm Frisk, MD  predniSONE (DELTASONE) 5 MG tablet Take 5 mg by mouth daily.   Yes Historical Provider, MD  rOPINIRole (REQUIP) 1 MG tablet Daily at bedtime   Yes Historical Provider, MD  rosuvastatin (CRESTOR) 10 MG tablet Take 10 mg by mouth daily.     Yes Historical Provider, MD  sulfaSALAzine (AZULFIDINE) 500 MG tablet Take 1,000 mg by mouth 2 (two) times daily. 2 tablets twice daily   Yes Historical Provider, MD  predniSONE (DELTASONE) 10 MG tablet Take 4 for  four days 3 for four days 2 for four days then two daily and stay 01/29/12   Storm Frisk, MD   Allergies Allergies  Allergen Reactions  . Codeine     REACTION: nausea  . Codeine Phosphate     REACTION: unspecified    Family History No family history on file. Social History  reports that she has never smoked. She has never used smokeless tobacco. She reports that she does not drink alcohol. Her drug history not on file.  Review Of Systems:  All systems were reviewed and were negative except as stated in the HPI.   Brief patient description:  71 y/o woman with mixed connective tissue disease, NTEMI, lung disease and a-fib  Events Since Admission: Started on ACS pathway  Current Status: guarded  Vital Signs: Temp:  [98 F (36.7 C)-100.7 F (38.2 C)] 99.4 F (37.4 C) (11/10 2307) Pulse Rate:  [86-106] 98  (11/10 2307) Resp:  [20-32] 31  (11/11 0230) BP: (70-118)/(32-64) 96/54 mmHg (11/11 0230) SpO2:  [84 %-99 %] 97 % (11/11 0230) Weight:  [95.6 kg (210 lb 12.2 oz)-98.4 kg (216 lb 14.9 oz)] 95.6 kg (210 lb 12.2 oz) (11/11 0212)  Physical Examination: General:  Elderly woman laying in bed in NAD Neuro:  CNII-XII intact, A&Ox3 HEENT:  PERRL, EOMI, OP clear Neck:  Supple, unable to assess JVD due to body habitus Cardiovascular:  Irregularly irregular, normal rate, no mrg Lungs:  CTAB, no wrr Abdomen:  Soft, NTND, +BS Musculoskeletal:  No obvious deformities, no edema, no clubbing Skin:  No rash  Principal Problem:  *NSTEMI (non-ST elevated myocardial infarction)   ASSESSMENT AND PLAN  PULMONARY No results found for this basename: PHART:5,PCO2:5,PCO2ART:5,PO2ART:5,HCO3:5,O2SAT:5 in the last 168 hours Ventilator Settings:   CXR:  CT with ?round atelectesis vs, fluid in fissure on right, mosaicism ETT:  none  A:  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  Will start 60mg  prednisone daily now and hold amiodarone for now - continue home medications  CARDIOVASCULAR  Lab 02/04/12 0230 02/03/12 2139  TROPONINI 1.32* 1.40*  LATICACIDVEN -- --  PROBNP -- --   ECG:  A-fib with lateral ST depressions Lines: none  A: NSTEMI, hypotension P:  - On ACS protocol - hypotension may have been induced by metoprolol - consider decreasing metoprolol to 12.5mg  daily - urine on admission VERY concentrated so may also be related to hypovolemia, BP improved with 500cc bolus.  RENAL  Lab 02/04/12 0230 02/03/12 2155 02/03/12 1526  NA 133* 134* 135  K 3.9 3.7 --  CL 100 99 100  CO2 22 24 25   BUN 22 21 20   CREATININE 1.40* 1.22* 1.12*  CALCIUM 8.5 8.5 8.8  MG -- 1.7 --  PHOS -- -- --   Intake/Output    None    Foley:  Placed 11/10  A:  AKI, at risk for adrenal insufficiency P:   - Gentle hydration with 500cc bolus - Will start 60mg  prednisone daily for lung disease, this should cover adrenal insufficiency if present  GASTROINTESTINAL  Lab 02/03/12 2155 02/03/12 1526  AST 18 22  ALT 12 13  ALKPHOS 71 80  BILITOT 0.9 0.9  PROT 6.0 6.3  ALBUMIN 2.5* 2.7*    A:  No current problems P:   - PO fluid intake  HEMATOLOGIC  Lab 02/04/12 0230 02/03/12 2155 02/03/12 1541 02/03/12 1526  HGB 10.1* 9.5* -- 10.4*  HCT 31.7* 29.3* -- 32.1*  PLT 123* 132* -- 157  INR 1.79* 1.77* 1.62* --  APTT 60* 48* -- --   A:  Antiocoagulation for ACS and a-fib P:  Currently on heparin ACS nomagram  INFECTIOUS  Lab 02/04/12 0230 02/03/12 2155 02/03/12 1526  WBC 6.8 8.5 13.5*  PROCALCITON -- -- --   Cultures: urine Antibiotics: Ceftriaxone, azithromycin  A:  UTI P:   - follow up urine cx - continue current antibiotic regimen  ENDOCRINE No results found for this basename: GLUCAP:5 in the last 168 hours A:  At risk for adrenal  insufficiency   P:   - 60 mg prednisone daily, slow taper  NEUROLOGIC  A:  No current problems P:   - continue to monitor mental status  BEST PRACTICE / DISPOSITION Level of Care:  ICU Primary Service:  cardiology Consultants:  pulmonary Code Status:  full Diet:  cardiac DVT Px:  Heparin gtt GI Px:  protonix Skin Integrity:  good Social / Family:  No family present at time of interview  GIDDINGS, Charlann Lange., M.D. Pulmonary and Critical Care Medicine  HealthCare Pager: [phone]  I spent 30 minutes of critical care time in the care of this patient.   02/04/2012, 3:45 AM

## 2012-02-04 NOTE — Progress Notes (Signed)
Pt slowly responding to saline bolus; after 500cc BP 88/49; Dr. Sharyn Lull called and updated regarding low BP and EKG changes that were noted; new orders given; agreed to have CCM evaluate pt; pt states that she is feeling better; continues to deny chest pain

## 2012-02-04 NOTE — Progress Notes (Signed)
Subjective:  Patient denies any chest pain states feeling better after increasing doses of prednisone.  Objective:  Vital Signs in the last 24 hours: Temp:  [98 F (36.7 C)-100.7 F (38.2 C)] 98.1 F (36.7 C) (11/11 0745) Pulse Rate:  [71-106] 71  (11/11 0745) Resp:  [10-32] 19  (11/11 0745) BP: (70-134)/(32-78) 106/74 mmHg (11/11 1100) SpO2:  [84 %-100 %] 91 % (11/11 1100) Weight:  [95.6 kg (210 lb 12.2 oz)-98.4 kg (216 lb 14.9 oz)] 95.6 kg (210 lb 12.2 oz) (11/11 0212)  Intake/Output from previous day: 11/10 0701 - 11/11 0700 In: 1430.4 [I.V.:130.4; IV Piggyback:1000] Out: 300 [Urine:300] Intake/Output from this shift: Total I/O In: 14 [I.V.:14] Out: -   Physical Exam: Neck: no adenopathy, no carotid bruit, no JVD and supple, symmetrical, trachea midline Lungs: clear to auscultation bilaterally Heart: irregularly irregular rhythm, S1, S2 normal and Soft systolic murmur noted Abdomen: soft, non-tender; bowel sounds normal; no masses,  no organomegaly Extremities: extremities normal, atraumatic, no cyanosis or edema  Lab Results:  Basename 02/04/12 0230 02/03/12 2155  WBC 6.8 8.5  HGB 10.1* 9.5*  PLT 123* 132*    Basename 02/04/12 0230 02/03/12 2155  NA 133* 134*  K 3.9 3.7  CL 100 99  CO2 22 24  GLUCOSE 104* 114*  BUN 22 21  CREATININE 1.40* 1.22*    Basename 02/04/12 0900 02/04/12 0230  TROPONINI 1.03* 1.32*   Hepatic Function Panel  Basename 02/03/12 2155  PROT 6.0  ALBUMIN 2.5*  AST 18  ALT 12  ALKPHOS 71  BILITOT 0.9  BILIDIR --  IBILI --    Basename 02/04/12 0230  CHOL 88   No results found for this basename: PROTIME in the last 72 hours  Imaging: Imaging results have been reviewed and Ct Angio Chest Pe W/cm &/or Wo Cm  02/03/2012  *RADIOLOGY REPORT*  Clinical Data: Per EMS: Pt from home, c/o lupus flare up, nausea with dry heaves, dizziness, periods of disorientation (per daughter). Prednisone changes due to flare ups and remissions for  last 3 weeks, Chest pain.  Hx: HTN, CAD, A-fib, Angina, Lupus, PNA, CABG, Pulmonary fibrosis  CT ANGIOGRAPHY CHEST  Technique:  Multidetector CT imaging of the chest using the standard protocol during bolus administration of intravenous contrast. Multiplanar reconstructed images including MIPs were obtained and reviewed to evaluate the vascular anatomy.  Contrast: 80mL OMNIPAQUE IOHEXOL 350 MG/ML SOLN  Comparison: CT angiogram of the chest dated 01/11/2012  Findings: No filling defect is identified in the pulmonary arterial tree to suggest pulmonary embolus.  Right upper paratracheal node, 1.1 cm in short axis (formerly the same).  Additional scattered paratracheal nodes are present. Precarinal node 0.8 cm in short axis.  Coronary atherosclerosis noted.  Calcified mitral valve.  Four- chamber cardiomegaly.  Flattened interventricular septum.  Mildly prominent main pulmonary artery, 4 cm in diameter.  Reflux of contrast into the upper portion of the IVC noted.  A similar mosaic attenuation in the lungs.  Suspected emphysema. Continued peripheral band of apparent atelectasis in the right upper lobe with scarring or atelectasis in the right middle lobe. Stable scarring in the left upper lobe.  Mild increase in nodular atelectasis in the superior segment left lower lobe as on image 116 of series 9 (new finding compared to 01/11/2012 stable 3 mm nodule, right lower lobe, image 53 of series 5.  Prior ground-glass nodule in the right lower lobe shown on the prior exam has resolved.  IMPRESSION:  1.  No embolus.  2.  Cardiomegaly with suspected elevated right heart pressures given the flattened interventricular septum and prominent main pulmonary artery. 3.  Mild stable mediastinal adenopathy, etiology uncertain. 4.  Emphysema 5. Chronic scattered mosaic attenuation in the lungs, stable, possibly from air trapping or hypersensitivity pneumonitis. 6.  The 3 mm nodule in the right lower lobe is stable back through 05/04/2011.   This is statistically highly likely to be benign. 7.  Mitral annular calcification.   Original Report Authenticated By: Gaylyn Rong, M.D.    Dg Chest Port 1 View  02/03/2012  *RADIOLOGY REPORT*  Clinical Data: Shortness of breath  PORTABLE CHEST - 1 VIEW  Comparison: 01/11/2012  Findings: Cardiomegaly again noted.  Stable chronic mild interstitial prominence.  No acute infiltrate or pleural effusion. Mild elevation of the right hemidiaphragm again noted.  Stable right basilar scarring.  IMPRESSION:  No active disease.  No significant change.   Original Report Authenticated By: Natasha Mead, M.D.     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 Patient is off and is now on IV heparin. Check labs in a.m.   LOS: 1 day    Eller Sweis N 02/04/2012, 12:06 PM

## 2012-02-05 DIAGNOSIS — R0902 Hypoxemia: Secondary | ICD-10-CM

## 2012-02-05 DIAGNOSIS — J189 Pneumonia, unspecified organism: Secondary | ICD-10-CM

## 2012-02-05 DIAGNOSIS — R0602 Shortness of breath: Secondary | ICD-10-CM

## 2012-02-05 LAB — CBC
HCT: 27.4 % — ABNORMAL LOW (ref 36.0–46.0)
Hemoglobin: 9.1 g/dL — ABNORMAL LOW (ref 12.0–15.0)
MCH: 31.5 pg (ref 26.0–34.0)
MCV: 94.8 fL (ref 78.0–100.0)
Platelets: 95 10*3/uL — ABNORMAL LOW (ref 150–400)
RBC: 2.89 MIL/uL — ABNORMAL LOW (ref 3.87–5.11)
WBC: 3.9 10*3/uL — ABNORMAL LOW (ref 4.0–10.5)

## 2012-02-05 LAB — CK TOTAL AND CKMB (NOT AT ARMC)
CK, MB: 3.7 ng/mL (ref 0.3–4.0)
CK, MB: 3.6 ng/mL (ref 0.3–4.0)
Relative Index: INVALID (ref 0.0–2.5)
Relative Index: INVALID (ref 0.0–2.5)
Total CK: 40 U/L (ref 7–177)
Total CK: 40 U/L (ref 7–177)

## 2012-02-05 LAB — PROTIME-INR: Prothrombin Time: 17.7 seconds — ABNORMAL HIGH (ref 11.6–15.2)

## 2012-02-05 LAB — BASIC METABOLIC PANEL
CO2: 24 mEq/L (ref 19–32)
Calcium: 8.5 mg/dL (ref 8.4–10.5)
Chloride: 101 mEq/L (ref 96–112)
Creatinine, Ser: 1.07 mg/dL (ref 0.50–1.10)
Glucose, Bld: 168 mg/dL — ABNORMAL HIGH (ref 70–99)

## 2012-02-05 LAB — APTT: aPTT: 73 seconds — ABNORMAL HIGH (ref 24–37)

## 2012-02-05 LAB — RETICULOCYTES: Retic Count, Absolute: 49.6 10*3/uL (ref 19.0–186.0)

## 2012-02-05 MED ORDER — PIPERACILLIN-TAZOBACTAM 3.375 G IVPB
3.3750 g | Freq: Three times a day (TID) | INTRAVENOUS | Status: DC
  Administered 2012-02-05 – 2012-02-06 (×4): 3.375 g via INTRAVENOUS
  Filled 2012-02-05 (×6): qty 50

## 2012-02-05 MED ORDER — VANCOMYCIN HCL 1000 MG IV SOLR
750.0000 mg | Freq: Two times a day (BID) | INTRAVENOUS | Status: DC
  Administered 2012-02-05 – 2012-02-06 (×2): 750 mg via INTRAVENOUS
  Filled 2012-02-05 (×3): qty 750

## 2012-02-05 MED ORDER — VANCOMYCIN HCL 1000 MG IV SOLR
1500.0000 mg | Freq: Once | INTRAVENOUS | Status: AC
  Administered 2012-02-05: 1500 mg via INTRAVENOUS
  Filled 2012-02-05: qty 1500

## 2012-02-05 NOTE — Progress Notes (Signed)
ANTICOAGULATION CONSULT NOTE - Follow Up Consult  Pharmacy Consult for heparin Indication: ACS  Labs:  Basename 02/05/12 0925 02/05/12 0244 02/04/12 2130 02/04/12 1737 02/04/12 0900 02/04/12 0230 02/03/12 2155 02/03/12 2139  HGB -- 9.1* -- -- -- 10.1* -- --  HCT -- 27.4* -- -- -- 31.7* 29.3* --  PLT -- 95* -- -- -- 123* 132* --  APTT 73* 93* -- 54* -- -- -- --  LABPROT -- 17.7* -- -- -- 20.2* 20.0* --  INR -- 1.50* -- -- -- 1.79* 1.77* --  HEPARINUNFRC 0.45 -- -- 0.82* -- 1.87* -- --  CREATININE -- 1.07 -- -- -- 1.40* 1.22* --  CKTOTAL 40 40 145 -- -- -- -- --  CKMB 3.7 3.5 3.4 -- -- -- -- --  TROPONINI -- -- -- -- 1.03* 1.32* -- 1.40*    Assessment  71yo female shows high heparin level for NSTEMI and the heparin level is now in goal on 1400 units/hr.  Earlier level of 1.82 may have been an error and not likely due to apixiban PTA. Hg/hct stable and platelets=95 (down from 157 on 11/10).   Goal Anti-Xa= 0.3-0.7 Platelet monitoring per protocol: yes  Plan -No heparin changes -Daily heparin level and CBC   Harland German, Pharm D 02/05/2012 11:28 AM

## 2012-02-05 NOTE — Progress Notes (Signed)
ANTIBIOTIC CONSULT NOTE - INITIAL  Pharmacy Consult for vanc/zosyn Indication: bacteremia, UTI  Allergies  Allergen Reactions  . Codeine     REACTION: nausea  . Codeine Phosphate     REACTION: unspecified    Patient Measurements: Height: 5\' 6"  (167.6 cm) Weight: 210 lb 12.2 oz (95.6 kg) IBW/kg (Calculated) : 59.3    Vital Signs: Temp: 97.6 F (36.4 C) (11/12 0732) Temp src: Oral (11/12 0732) BP: 104/56 mmHg (11/12 0732) Pulse Rate: 67  (11/12 0732) Intake/Output from previous day: 11/11 0701 - 11/12 0700 In: 1214.5 [P.O.:340; I.V.:574.5; IV Piggyback:300] Out: 1350 [Urine:1350] Intake/Output from this shift: Total I/O In: 273 [P.O.:220; I.V.:53] Out: -   Labs:  Basename 02/05/12 0244 02/04/12 0230 02/03/12 2155  WBC 3.9* 6.8 8.5  HGB 9.1* 10.1* 9.5*  PLT 95* 123* 132*  LABCREA -- -- --  CREATININE 1.07 1.40* 1.22*   Estimated Creatinine Clearance: 54.6 ml/min (by C-G formula based on Cr of 1.07). No results found for this basename: VANCOTROUGH:2,VANCOPEAK:2,VANCORANDOM:2,GENTTROUGH:2,GENTPEAK:2,GENTRANDOM:2,TOBRATROUGH:2,TOBRAPEAK:2,TOBRARND:2,AMIKACINPEAK:2,AMIKACINTROU:2,AMIKACIN:2, in the last 72 hours     Medical History: Past Medical History  Diagnosis Date  . Coronary artery disease   . Atrial fibrillation     Now NSR  . GERD (gastroesophageal reflux disease)   . Hyperlipidemia   . 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 Anemia   . Angina   . Shortness of breath     with activity  . Hypertension   . Blood transfusion   . Pneumonia   . H/O hiatal hernia   . Lupus     Medications:  Scheduled:    . sodium chloride   Intravenous Q28 days  . acetaminophen  650 mg Oral Q28 days  . aspirin EC  81 mg Oral Daily  . atorvastatin  40 mg Oral q1800  . budesonide-formoterol  2 puff Inhalation BID  . ferrous sulfate  325 mg Oral Q breakfast  . hydroxychloroquine  200 mg Oral BID  . metoprolol  25 mg Oral BID  . pantoprazole   40 mg Oral BID AC  . predniSONE  60 mg Oral Q breakfast  . rOPINIRole  0.5 mg Oral BID  . sulfaSALAzine  1,000 mg Oral BID  . [DISCONTINUED] azithromycin  500 mg Intravenous Q24H  . [DISCONTINUED] cefTRIAXone (ROCEPHIN)  IV  1 g Intravenous Q24H   Assessment: 71 yo female with GPC bactermia and enterococcal UTI on azith rocephin for r/o PNA now to start zosyn/vancomycin.  SCr= 1.07 and CrCl~55.  11/10 azith>11/12 11/10 roceph>11/12 11/12 vanc 11/12 zosyn  11/10 blood- GPC/clusters 11/10 urine- enteroccoccus (> 100, 000 colonies)  Goal of Therapy:  Vancomycin trough level 15-20 mcg/ml  Plan:  -Vancomycin 1500mg  IV x1 followed by 750mg  IV q12h -Zosyn 3.375gm IV q8hr -Will follow cultures  Harland German, Pharm D 02/05/2012 10:17 AM

## 2012-02-05 NOTE — Progress Notes (Signed)
Subjective:  Patient denies any chest pain or shortness of breath states overall feels better. Blood cultures positive for gram-positive cocci in cluster final result pending. Objective:  Vital Signs in the last 24 hours: Temp:  [97.2 F (36.2 C)-98.1 F (36.7 C)] 97.6 F (36.4 C) (11/12 0732) Pulse Rate:  [67-86] 67  (11/12 0732) Resp:  [17-18] 18  (11/11 1630) BP: (104-132)/(47-83) 104/56 mmHg (11/12 0732) SpO2:  [89 %-100 %] 97 % (11/12 0732)  Intake/Output from previous day: 11/11 0701 - 11/12 0700 In: 1214.5 [P.O.:340; I.V.:574.5; IV Piggyback:300] Out: 1350 [Urine:1350] Intake/Output from this shift: Total I/O In: 246.5 [P.O.:220; I.V.:26.5] Out: -   Physical Exam: Neck: no adenopathy, no carotid bruit, no JVD and supple, symmetrical, trachea midline Lungs: clear to auscultation bilaterally Heart: regular rate and rhythm, S1, S2 normal and Soft systolic murmur unchanged no S3 gallop Abdomen: soft, non-tender; bowel sounds normal; no masses,  no organomegaly Extremities: extremities normal, atraumatic, no cyanosis or edema  Lab Results:  Basename 02/05/12 0244 02/04/12 0230  WBC 3.9* 6.8  HGB 9.1* 10.1*  PLT 95* 123*    Basename 02/05/12 0244 02/04/12 0230  NA 134* 133*  K 3.6 3.9  CL 101 100  CO2 24 22  GLUCOSE 168* 104*  BUN 26* 22  CREATININE 1.07 1.40*    Basename 02/04/12 0900 02/04/12 0230  TROPONINI 1.03* 1.32*   Hepatic Function Panel  Basename 02/03/12 2155  PROT 6.0  ALBUMIN 2.5*  AST 18  ALT 12  ALKPHOS 71  BILITOT 0.9  BILIDIR --  IBILI --    Basename 02/04/12 0230  CHOL 88   No results found for this basename: PROTIME in the last 72 hours  Imaging: Imaging results have been reviewed and Ct Angio Chest Pe W/cm &/or Wo Cm  02/03/2012  *RADIOLOGY REPORT*  Clinical Data: Per EMS: Pt from home, c/o lupus flare up, nausea with dry heaves, dizziness, periods of disorientation (per daughter). Prednisone changes due to flare ups and  remissions for last 3 weeks, Chest pain.  Hx: HTN, CAD, A-fib, Angina, Lupus, PNA, CABG, Pulmonary fibrosis  CT ANGIOGRAPHY CHEST  Technique:  Multidetector CT imaging of the chest using the standard protocol during bolus administration of intravenous contrast. Multiplanar reconstructed images including MIPs were obtained and reviewed to evaluate the vascular anatomy.  Contrast: 80mL OMNIPAQUE IOHEXOL 350 MG/ML SOLN  Comparison: CT angiogram of the chest dated 01/11/2012  Findings: No filling defect is identified in the pulmonary arterial tree to suggest pulmonary embolus.  Right upper paratracheal node, 1.1 cm in short axis (formerly the same).  Additional scattered paratracheal nodes are present. Precarinal node 0.8 cm in short axis.  Coronary atherosclerosis noted.  Calcified mitral valve.  Four- chamber cardiomegaly.  Flattened interventricular septum.  Mildly prominent main pulmonary artery, 4 cm in diameter.  Reflux of contrast into the upper portion of the IVC noted.  A similar mosaic attenuation in the lungs.  Suspected emphysema. Continued peripheral band of apparent atelectasis in the right upper lobe with scarring or atelectasis in the right middle lobe. Stable scarring in the left upper lobe.  Mild increase in nodular atelectasis in the superior segment left lower lobe as on image 116 of series 9 (new finding compared to 01/11/2012 stable 3 mm nodule, right lower lobe, image 53 of series 5.  Prior ground-glass nodule in the right lower lobe shown on the prior exam has resolved.  IMPRESSION:  1.  No embolus. 2.  Cardiomegaly with suspected  elevated right heart pressures given the flattened interventricular septum and prominent main pulmonary artery. 3.  Mild stable mediastinal adenopathy, etiology uncertain. 4.  Emphysema 5. Chronic scattered mosaic attenuation in the lungs, stable, possibly from air trapping or hypersensitivity pneumonitis. 6.  The 3 mm nodule in the right lower lobe is stable back  through 05/04/2011.  This is statistically highly likely to be benign. 7.  Mitral annular calcification.   Original Report Authenticated By: Gaylyn Rong, M.D.    Dg Chest Port 1 View  02/03/2012  *RADIOLOGY REPORT*  Clinical Data: Shortness of breath  PORTABLE CHEST - 1 VIEW  Comparison: 01/11/2012  Findings: Cardiomegaly again noted.  Stable chronic mild interstitial prominence.  No acute infiltrate or pleural effusion. Mild elevation of the right hemidiaphragm again noted.  Stable right basilar scarring.  IMPRESSION:  No active disease.  No significant change.   Original Report Authenticated By: Natasha Mead, M.D.     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 Check 2-D echo Check anemia panel and stool for occult blood May need TEE for significant bacteremia  LOS: 2 days    Shaterica Mcclatchy N 02/05/2012, 9:28 AM

## 2012-02-05 NOTE — Progress Notes (Signed)
Positive blood cultures called to Dr. Darrick Penna gram positive cocci in clusters in both aerobic and anaerobic vials.

## 2012-02-05 NOTE — Progress Notes (Signed)
ANTICOAGULATION CONSULT NOTE - Follow Up Consult  Pharmacy Consult for heparin Indication: pulmonary embolus and NSTEMI  Labs:  Basename 02/05/12 0244 02/04/12 2130 02/04/12 1737 02/04/12 1503 02/04/12 0900 02/04/12 0230 02/03/12 2155 02/03/12 2139 02/03/12 1526  HGB 9.1* -- -- -- -- 10.1* -- -- --  HCT 27.4* -- -- -- -- 31.7* 29.3* -- --  PLT PENDING -- -- -- -- 123* 132* -- --  APTT 93* -- 54* -- -- 60* -- -- --  LABPROT 17.7* -- -- -- -- 20.2* 20.0* -- --  INR 1.50* -- -- -- -- 1.79* 1.77* -- --  HEPARINUNFRC -- -- 0.82* -- -- 1.87* -- -- --  CREATININE -- -- -- -- -- 1.40* 1.22* -- 1.12*  CKTOTAL -- 145 -- 63 79 -- -- -- --  CKMB -- 3.4 -- 4.1* 4.6* -- -- -- --  TROPONINI -- -- -- -- 1.03* 1.32* -- 1.40* --    Assessment/Plan:  71yo female therapeutic on heparin after rate increase.  Will continue gtt at current rate and confirm with additional PTT and level.  Colleen Can PharmD BCPS 02/05/2012,3:54 AM

## 2012-02-05 NOTE — Progress Notes (Addendum)
Name: [name] MRN: [id number] DOB: 22-Jun-1940    LOS: 2  Referring Provider:  Dr. Sharyn Lull Reason for Referral:  hypotension  PULMONARY / CRITICAL CARE MEDICINE    Brief patient description:  71 y/o woman with mixed connective tissue disease, admitted on 11/11 for NSTEMI, c/b underlying  lung disease and a-fib  Events Since Admission: Started on ACS pathway  Current Status: guarded  Vital Signs: Temp:  [97.2 F (36.2 C)-97.9 F (36.6 C)] 97.8 F (36.6 C) (11/12 1219) Pulse Rate:  [67-74] 67  (11/12 0732) Resp:  [18] 18  (11/11 1630) BP: (104-132)/(56-78) 115/69 mmHg (11/12 1219) SpO2:  [93 %-100 %] 97 % (11/12 0732)  Physical Examination: General:  Elderly woman laying in bed in NAD Neuro:  CNII-XII intact, A&Ox3 HEENT:  PERRL, EOMI, OP clear Neck:  Supple, unable to assess JVD due to body habitus Cardiovascular:  Irregularly irregular, normal rate, no mrg Lungs:  CTAB, no wrr Abdomen:  Soft, NTND, +BS Musculoskeletal:  No obvious deformities, no edema, no clubbing Skin:  No rash  Principal Problem:  *NSTEMI (non-ST elevated myocardial infarction)   ASSESSMENT AND PLAN  PULMONARY No results found for this basename: PHART:5,PCO2:5,PCO2ART:5,PO2ART:5,HCO3:5,O2SAT:5 in the last 168 hours Ventilator Settings:   CXR:  CT with ?round atelectesis vs, fluid in fissure on right, mosaicism ETT:  none  A:  Lung disease likely related to connective tissue disease - Recently has been improving with steroids and worsening again when steroids are tapered - ? Possible amiodarone induced pneumonitis, also consider ALI in setting of sepsis +/- element of edema.  P: - Consider stopping amiodarone if patient deteriorates.  Will start 60 mg prednisone daily now and hold amiodarone for now - Continue home medications.  CARDIOVASCULAR  Lab 02/04/12 0900 02/04/12 0230 02/03/12 2139  TROPONINI 1.03* 1.32* 1.40*  LATICACIDVEN -- -- --  PROBNP -- -- --   ECG:  A-fib with  lateral ST depressions Lines: none  A: NSTEMI Hypotension (resolved) Sepsis P:  - per cards   RENAL  Lab 02/05/12 0244 02/04/12 0230 02/03/12 2155 02/03/12 1526  NA 134* 133* 134* 135  K 3.6 3.9 -- --  CL 101 100 99 100  CO2 24 22 24 25   BUN 26* 22 21 20   CREATININE 1.07 1.40* 1.22* 1.12*  CALCIUM 8.5 8.5 8.5 8.8  MG -- -- 1.7 --  PHOS -- -- -- --   Intake/Output      11/11 0701 - 11/12 0700 11/12 0701 - 11/13 0700   P.O. 340 420   I.V. (mL/kg) 574.5 (6) 185.5 (1.9)   Other     IV Piggyback 300 550   Total Intake(mL/kg) 1214.5 (12.7) 1155.5 (12.1)   Urine (mL/kg/hr) 1350 (0.6) 400 (0.6)   Total Output 1350 400   Net -135.5 +755.5         Foley:  Placed 11/10  A:  AKI (resolved) P:   - Gentle hydration with 500cc bolus - Keep euvolemic   GASTROINTESTINAL  Lab 02/03/12 2155 02/03/12 1526  AST 18 22  ALT 12 13  ALKPHOS 71 80  BILITOT 0.9 0.9  PROT 6.0 6.3  ALBUMIN 2.5* 2.7*    A:  No current problems P:   - PO fluid intake  HEMATOLOGIC  Lab 02/05/12 0925 02/05/12 0244 02/04/12 1737 02/04/12 0230 02/03/12 2155 02/03/12 1541 02/03/12 1526  HGB -- 9.1* -- 10.1* 9.5* -- 10.4*  HCT -- 27.4* -- 31.7* 29.3* -- 32.1*  PLT -- 95* --  123* 132* -- 157  INR -- 1.50* -- 1.79* 1.77* 1.62* --  APTT 73* 93* 54* 60* 48* -- --   A:  Antiocoagulation for ACS and a-fib P:  Currently on heparin ACS nomagram  INFECTIOUS  Lab 02/05/12 0244 02/04/12 0230 02/03/12 2155 02/03/12 1526  WBC 3.9* 6.8 8.5 13.5*  PROCALCITON -- -- -- --   Cultures: Urine: enterococcus (s) pending>>> BC X 2 11/10:  GPC in clusters Antibiotics: Ceftriaxone, azithromycin 11/10>>>11/12 vanc 11/12>>> Zosyn 11/12>>>  A:   UTI (enterococcus)  Sensitivities pending.   GPC bacteremia Enterococcus bacteremia unusual but perhaps possible in immunocompromised host.   Sepsis Due to above P:   - c/u culture data - cont vanc and zosyn  - may need TEE to r/o endocarditis but will defer to  primary.  ENDOCRINE No results found for this basename: GLUCAP:5 in the last 168 hours A:  At risk for adrenal insufficiency   P:   - 60 mg prednisone daily, slow taper  NEUROLOGIC  A:  No current problems P:   - Continue to monitor mental status  BEST PRACTICE / DISPOSITION Level of Care:  ICU Primary Service:  cardiology Consultants:  pulmonary Code Status:  full Diet:  cardiac DVT Px:  Heparin gtt GI Px:  protonix Skin Integrity:  good Social / Family:  No family present at time of interview  Summary statement:    BABCOCK,PETE  02/05/2012, 2:31 PM  Enterococcus in the blood, on appropriate antibiotics.  Already feels much better.  At this point, continue treatment of sepsis, once tapering down the steroids then only taper down to home dose.  PCCM will sign off, please call back if needed.  Patient seen and examined, agree with above note.  I dictated the care and orders written for this patient under my direction.  Koren Bound, M.D. [phone]

## 2012-02-06 ENCOUNTER — Ambulatory Visit: Payer: Medicare Other | Admitting: Critical Care Medicine

## 2012-02-06 ENCOUNTER — Encounter (HOSPITAL_COMMUNITY): Payer: Self-pay | Admitting: *Deleted

## 2012-02-06 ENCOUNTER — Encounter (HOSPITAL_COMMUNITY)
Admission: EM | Disposition: A | Discharge status: Home / Self Care | Payer: Self-pay | Source: Home / Self Care | Attending: Cardiology

## 2012-02-06 ENCOUNTER — Encounter (HOSPITAL_COMMUNITY): Payer: Self-pay | Admitting: Anesthesiology

## 2012-02-06 DIAGNOSIS — I38 Endocarditis, valve unspecified: Secondary | ICD-10-CM

## 2012-02-06 HISTORY — PX: TEE WITHOUT CARDIOVERSION: SHX5443

## 2012-02-06 LAB — IRON AND TIBC
Iron: 34 ug/dL — ABNORMAL LOW (ref 42–135)
TIBC: 253 ug/dL (ref 250–470)

## 2012-02-06 LAB — BASIC METABOLIC PANEL
GFR calc non Af Amer: 54 mL/min — ABNORMAL LOW (ref >90)
Glucose, Bld: 164 mg/dL — ABNORMAL HIGH (ref 70–99)
Potassium: 4.1 mEq/L (ref 3.5–5.1)
Sodium: 136 mEq/L (ref 135–145)

## 2012-02-06 LAB — CBC
HCT: 28.5 % — ABNORMAL LOW (ref 36.0–46.0)
Hemoglobin: 9.2 g/dL — ABNORMAL LOW (ref 12.0–15.0)
RBC: 3.04 MIL/uL — ABNORMAL LOW (ref 3.87–5.11)
RDW: 13.5 % (ref 11.5–15.5)
WBC: 3.9 10*3/uL — ABNORMAL LOW (ref 4.0–10.5)

## 2012-02-06 LAB — URINE CULTURE: Colony Count: >=100000

## 2012-02-06 LAB — FERRITIN: Ferritin: 234 ng/mL (ref 10–291)

## 2012-02-06 LAB — HEPARIN LEVEL (UNFRACTIONATED): Heparin Unfractionated: 0.19 IU/mL — ABNORMAL LOW (ref 0.30–0.70)

## 2012-02-06 SURGERY — ECHOCARDIOGRAM, TRANSESOPHAGEAL
Anesthesia: Moderate Sedation

## 2012-02-06 MED ORDER — HEPARIN (PORCINE) IN NACL 100-0.45 UNIT/ML-% IJ SOLN
2100.0000 [IU]/h | INTRAMUSCULAR | Status: DC
  Administered 2012-02-06 – 2012-02-07 (×2): 2000 [IU]/h via INTRAVENOUS
  Administered 2012-02-07 – 2012-02-12 (×9): 2100 [IU]/h via INTRAVENOUS
  Filled 2012-02-06 (×15): qty 250

## 2012-02-06 MED ORDER — FENTANYL CITRATE 0.05 MG/ML IJ SOLN
INTRAMUSCULAR | Status: AC
  Filled 2012-02-06: qty 2

## 2012-02-06 MED ORDER — GENTAMICIN IN SALINE 1.6-0.9 MG/ML-% IV SOLN
80.0000 mg | Freq: Three times a day (TID) | INTRAVENOUS | Status: DC
  Administered 2012-02-06 – 2012-02-07 (×2): 80 mg via INTRAVENOUS
  Filled 2012-02-06 (×4): qty 50

## 2012-02-06 MED ORDER — SODIUM CHLORIDE 0.9 % IJ SOLN
3.0000 mL | Freq: Two times a day (BID) | INTRAMUSCULAR | Status: DC
  Administered 2012-02-07 – 2012-02-15 (×7): 3 mL via INTRAVENOUS

## 2012-02-06 MED ORDER — BUTAMBEN-TETRACAINE-BENZOCAINE 2-2-14 % EX AERO
INHALATION_SPRAY | CUTANEOUS | Status: DC | PRN
  Administered 2012-02-06: 2 via TOPICAL

## 2012-02-06 MED ORDER — HEPARIN BOLUS VIA INFUSION
2000.0000 [IU] | Freq: Once | INTRAVENOUS | Status: DC
  Administered 2012-02-06: 2000 [IU] via INTRAVENOUS
  Filled 2012-02-06: qty 2000

## 2012-02-06 MED ORDER — DIAZEPAM 5 MG PO TABS: 5.0000 mg | ORAL_TABLET | ORAL | Status: AC

## 2012-02-06 MED ORDER — SODIUM CHLORIDE 0.9 % IV SOLN: INTRAVENOUS | Status: DC

## 2012-02-06 MED ORDER — SODIUM CHLORIDE 0.9 % IJ SOLN: 3.0000 mL | INTRAMUSCULAR | Status: DC | PRN

## 2012-02-06 MED ORDER — FENTANYL CITRATE 0.05 MG/ML IJ SOLN
INTRAMUSCULAR | Status: DC | PRN
  Administered 2012-02-06 (×2): 25 ug via INTRAVENOUS

## 2012-02-06 MED ORDER — ASPIRIN 81 MG PO CHEW
324.0000 mg | CHEWABLE_TABLET | ORAL | Status: AC
  Administered 2012-02-07: 324 mg via ORAL
  Filled 2012-02-06: qty 4

## 2012-02-06 MED ORDER — MIDAZOLAM HCL 10 MG/2ML IJ SOLN
INTRAMUSCULAR | Status: DC | PRN
  Administered 2012-02-06: 2 mg via INTRAVENOUS

## 2012-02-06 MED ORDER — SODIUM CHLORIDE 0.9 % IV SOLN
2.0000 g | INTRAVENOUS | Status: DC
  Administered 2012-02-06 – 2012-02-14 (×45): 2 g via INTRAVENOUS
  Filled 2012-02-06 (×50): qty 2000

## 2012-02-06 MED ORDER — HEPARIN BOLUS VIA INFUSION
2000.0000 [IU] | Freq: Once | INTRAVENOUS | Status: DC
  Filled 2012-02-06: qty 2000

## 2012-02-06 MED ORDER — HEPARIN (PORCINE) IN NACL 100-0.45 UNIT/ML-% IJ SOLN
2000.0000 [IU]/h | INTRAMUSCULAR | Status: DC
  Filled 2012-02-06: qty 250

## 2012-02-06 MED ORDER — SODIUM CHLORIDE 0.9 % IV SOLN
250.0000 mL | INTRAVENOUS | Status: DC | PRN
  Administered 2012-02-06: 250 mL via INTRAVENOUS

## 2012-02-06 MED ORDER — MIDAZOLAM HCL 5 MG/ML IJ SOLN
INTRAMUSCULAR | Status: AC
  Filled 2012-02-06: qty 1

## 2012-02-06 NOTE — CV Procedure (Signed)
INDICATIONS:   The patient is 71 years old female with bacteremia and bioprosthetic MV.  PROCEDURE:  Informed consent was discussed including risks, benefits and alternatives for the procedure.  Risks include, but are not limited to, cough, sore throat, vomiting, nausea, somnolence, esophageal and stomach trauma or perforation, bleeding, low blood pressure, aspiration, pneumonia, infection, trauma to the teeth and death.    Patient was given sedation.  The oropharynx was anesthetized with topical lidocaine.  The transesophageal probe was inserted in the esophagus and stomach and multiple views were obtained.  Agitated saline was used after the transesophageal probe was removed from the body.  The patient was kept under observation until the patient left the procedure room.  The patient left the procedure room in stable condition.   COMPLICATIONS:  There were no immediate complications.  FINDINGS:  1. LEFT VENTRICLE: The left ventricle is hypertrophic with normal systolic function.  No thrombus or masses seen in the left ventricle.  2. RIGHT VENTRICLE:  The right ventricle is normal in structure and function without any thrombus or masses.    3. LEFT ATRIUM:  The left atrium is enlarged with smoke in cavity.  4. LEFT ATRIAL APPENDAGE:  The left atrial appendage has mobile thrombus.  5. RIGHT ATRIUM:  The right atrium is free of any thrombus or masses.    6. ATRIAL SEPTUM:  The atrial septum is normal without any ASD or PFO.  7. MITRAL VALVE:  The mitral valve is bioprosthetic with large mobile vegetation and severe eccentric  Regurgitation. Posterior leaflet is with restricted motion.  8. TRICUSPID VALVE:  The tricuspid valve is normal in structure and function with severe regurgitation. No masses, stenosis or vegetations.  9. AORTIC VALVE:  The aortic valve is calcific and  without regurgitation, masses, stenosis or vegetations.   10. PULMONIC VALVE:  The pulmonic valve is normal in  structure and function with trivial regurgitation. No masses, stenosis or vegetations.  11. AORTIC ARCH, ASCENDING AND DESCENDING AORTA:  The aorta had moderate atherosclerosis in the ascending or descending aorta.  The aortic arch was normal.  IMPRESSION:   1. 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 2. Tricuspid valve severe regurgitation. 3. Moderate atherosclerosis. 4. Mobile thrombus in LA appendage  RECOMMENDATIONS:    Infectious disease consult .  Consider long term coumadin use if possible. Possible CVTS consult post Cardiac Catheterization Dr. Sharyn Lull notified.

## 2012-02-06 NOTE — Consult Note (Signed)
[facility]  Total days of antibiotics 4        Day 2 piptazo        Day 2 vanco        2 Days of azithromycin and ceftriaxone (D/c'd 11/11)       Reason for Consult:enterococcal bacteremia concern for endocarditis    Referring Physician: Sharyn Lull  Principal Problem:  *NSTEMI (non-ST elevated myocardial infarction)    HPI: [name] is a 71 y.o. 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 Patient states she was her needed for pneumonia approximately 4 weeks ago and subsequently was treated with 10 days of moxifloxacin and steroids taper her with initial improvement in her symptoms and for last 2 days has progressive increasing shortness of breath associated with generalized weakness and nausea. On admit, she was found to have elevated troponins plus nonspecific EKG changes concerning for NSTEMI thus she was started on heparin gtt as part of ACS protocol. She was found to have mild fever on admit, thus she underwent infectious work up. Her ua was suggestive of uti but she did have some epi cells in the sample. + nitrites + LE. Her urine and blood cx from admit, isolated amp S enterococcus. She underwent TEE on 11/13 which showed  1. 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 2. Tricuspid valve severe regurgitation. 3. Moderate atherosclerosis. 4. Mobile thrombus in LA appendage   Past Medical History  Diagnosis Date  . Coronary artery disease   . Atrial fibrillation     Now NSR  . GERD (gastroesophageal reflux disease)   . Hyperlipidemia   . 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 Anemia   . Angina   . Shortness of breath     with activity  . Hypertension   . Blood transfusion   . Pneumonia   . H/O hiatal hernia   . Lupus     Allergies:  Allergies  Allergen Reactions  . Codeine     REACTION: nausea  . Codeine Phosphate     REACTION: unspecified    MEDICATIONS:    . aspirin  324 mg Oral Pre-Cath  . aspirin EC  81 mg Oral Daily  . atorvastatin  40 mg Oral q1800  . budesonide-formoterol  2 puff Inhalation BID  . diazepam  5 mg Oral On Call  . ferrous sulfate  325 mg Oral Q breakfast  . hydroxychloroquine  200 mg Oral BID  . metoprolol  25 mg Oral BID  . pantoprazole  40 mg Oral BID AC  . piperacillin-tazobactam (ZOSYN)  IV  3.375 g Intravenous Q8H  . predniSONE  60 mg Oral Q breakfast  . rOPINIRole  0.5 mg Oral BID  . sodium chloride  3 mL Intravenous Q12H  . sulfaSALAzine  1,000 mg Oral BID  . vancomycin  750 mg Intravenous Q12H  . [DISCONTINUED] sodium chloride   Intravenous Q28 days  . [DISCONTINUED] acetaminophen  650 mg Oral Q28 days  . [DISCONTINUED] heparin  2,000 Units Intravenous Once  . [COMPLETED] heparin  2,000 Units Intravenous Once    History  Substance Use Topics  .  Smoking status: Never Smoker   . Smokeless tobacco: Never Used  . Alcohol Use: No    History reviewed. No pertinent family history.   Review of Systems  Constitutional: Negative for fever, chills, diaphoresis, activity change, appetite change, fatigue and unexpected weight change.  HENT: Negative for congestion, sore throat, rhinorrhea, sneezing, trouble swallowing and sinus pressure.  Eyes: Negative for photophobia and visual disturbance.  Respiratory: Negative for cough, chest tightness, shortness of breath, wheezing and stridor.  Cardiovascular: Negative for chest pain, palpitations and leg swelling.  Gastrointestinal: Negative for nausea, vomiting, abdominal pain, diarrhea, constipation, blood in stool, abdominal distention and anal bleeding.   Genitourinary: Negative for dysuria, hematuria, flank pain and difficulty urinating.  Musculoskeletal: Negative for myalgias, back pain, joint swelling, arthralgias and gait problem.  Skin: Negative for color change, pallor, rash and wound.  Neurological: Negative for dizziness, tremors, weakness and light-headedness.  Hematological: Negative for adenopathy. Does not bruise/bleed easily.  Psychiatric/Behavioral: Negative for behavioral problems, confusion, sleep disturbance, dysphoric mood, decreased concentration and agitation.     OBJECTIVE: Temp:  [97.5 F (36.4 C)-98.5 F (36.9 C)] 97.7 F (36.5 C) (11/13 1454) Pulse Rate:  [75-83] 75  (11/13 1231) Resp:  [17-42] 18  (11/13 1714) BP: (119-181)/(50-104) 142/93 mmHg (11/13 1714) SpO2:  [89 %-100 %] 95 % (11/13 1650) Neck: no adenopathy, no carotid bruit, no JVD and supple, symmetrical, trachea midline  Lungs: CTAB, no wheezing or rhonchi Heart: irregularly irregular , S1, S2 normal and Soft systolic murmur noted  Abdomen: soft, non-tender; bowel sounds normal; no masses, no organomegaly  Extremities: extremities normal, atraumatic, no cyanosis or edema   LABS: Results for orders placed during the hospital encounter of 02/03/12 (from the past 48 hour(s))  CK TOTAL AND CKMB     Status: Normal   Collection Time   02/04/12  9:30 PM      Component Value Range Comment   Total CK 145  7 - 177 U/L HEMOLYSIS AT THIS LEVEL MAY AFFECT RESULT   CK, MB 3.4  0.3 - 4.0 ng/mL    Relative Index 2.3  0.0 - 2.5   CK TOTAL AND CKMB     Status: Normal   Collection Time   02/05/12  2:44 AM      Component Value Range Comment   Total CK 40  7 - 177 U/L    CK, MB 3.5  0.3 - 4.0 ng/mL    Relative Index RELATIVE INDEX IS INVALID  0.0 - 2.5   CBC     Status: Abnormal   Collection Time   02/05/12  2:44 AM      Component Value Range Comment   WBC 3.9 (*) 4.0 - 10.5 K/uL    RBC 2.89 (*) 3.87 - 5.11 MIL/uL    Hemoglobin 9.1 (*) 12.0 - 15.0 g/dL     HCT 44.0 (*) 10.2 - 46.0 %    MCV 94.8  78.0 - 100.0 fL    MCH 31.5  26.0 - 34.0 pg    MCHC 33.2  30.0 - 36.0 g/dL    RDW 72.5  36.6 - 44.0 %    Platelets 95 (*) 150 - 400 K/uL   BASIC METABOLIC PANEL     Status: Abnormal   Collection Time   02/05/12  2:44 AM      Component Value Range Comment   Sodium 134 (*) 135 - 145 mEq/L    Potassium 3.6  3.5 - 5.1 mEq/L    Chloride  101  96 - 112 mEq/L    CO2 24  19 - 32 mEq/L    Glucose, Bld 168 (*) 70 - 99 mg/dL    BUN 26 (*) 6 - 23 mg/dL    Creatinine, Ser 1.47  0.50 - 1.10 mg/dL    Calcium 8.5  8.4 - 82.9 mg/dL    GFR calc non Af Amer 50 (*) >90 mL/min    GFR calc Af Amer 58 (*) >90 mL/min   PROTIME-INR     Status: Abnormal   Collection Time   02/05/12  2:44 AM      Component Value Range Comment   Prothrombin Time 17.7 (*) 11.6 - 15.2 seconds    INR 1.50 (*) 0.00 - 1.49   APTT     Status: Abnormal   Collection Time   02/05/12  2:44 AM      Component Value Range Comment   aPTT 93 (*) 24 - 37 seconds   CK TOTAL AND CKMB     Status: Normal   Collection Time   02/05/12  9:25 AM      Component Value Range Comment   Total CK 40  7 - 177 U/L    CK, MB 3.7  0.3 - 4.0 ng/mL    Relative Index RELATIVE INDEX IS INVALID  0.0 - 2.5   HEPARIN LEVEL (UNFRACTIONATED)     Status: Normal   Collection Time   02/05/12  9:25 AM      Component Value Range Comment   Heparin Unfractionated 0.45  0.30 - 0.70 IU/mL   APTT     Status: Abnormal   Collection Time   02/05/12  9:25 AM      Component Value Range Comment   aPTT 73 (*) 24 - 37 seconds   CK TOTAL AND CKMB     Status: Normal   Collection Time   02/05/12  3:08 PM      Component Value Range Comment   Total CK 39  7 - 177 U/L    CK, MB 3.6  0.3 - 4.0 ng/mL    Relative Index RELATIVE INDEX IS INVALID  0.0 - 2.5   CK TOTAL AND CKMB     Status: Normal   Collection Time   02/05/12  9:36 PM      Component Value Range Comment   Total CK 31  7 - 177 U/L    CK, MB 3.5  0.3 - 4.0 ng/mL     Relative Index RELATIVE INDEX IS INVALID  0.0 - 2.5   VITAMIN B12     Status: Normal   Collection Time   02/05/12  9:36 PM      Component Value Range Comment   Vitamin B-12 353  211 - 911 pg/mL   IRON AND TIBC     Status: Abnormal   Collection Time   02/05/12  9:36 PM      Component Value Range Comment   Iron 34 (*) 42 - 135 ug/dL    TIBC 562  130 - 865 ug/dL    Saturation Ratios 13 (*) 20 - 55 %    UIBC 219  125 - 400 ug/dL   FERRITIN     Status: Normal   Collection Time   02/05/12  9:36 PM      Component Value Range Comment   Ferritin 234  10 - 291 ng/mL   RETICULOCYTES     Status: Abnormal   Collection Time   02/05/12  9:36 PM      Component Value Range Comment   Retic Ct Pct 1.6  0.4 - 3.1 %    RBC. 3.10 (*) 3.87 - 5.11 MIL/uL    Retic Count, Manual 49.6  19.0 - 186.0 K/uL   CBC     Status: Abnormal   Collection Time   02/06/12  2:34 AM      Component Value Range Comment   WBC 3.9 (*) 4.0 - 10.5 K/uL    RBC 3.04 (*) 3.87 - 5.11 MIL/uL    Hemoglobin 9.2 (*) 12.0 - 15.0 g/dL    HCT 16.1 (*) 09.6 - 46.0 %    MCV 93.8  78.0 - 100.0 fL    MCH 30.3  26.0 - 34.0 pg    MCHC 32.3  30.0 - 36.0 g/dL    RDW 04.5  40.9 - 81.1 %    Platelets 123 (*) 150 - 400 K/uL   HEPARIN LEVEL (UNFRACTIONATED)     Status: Abnormal   Collection Time   02/06/12  2:34 AM      Component Value Range Comment   Heparin Unfractionated 0.28 (*) 0.30 - 0.70 IU/mL   BASIC METABOLIC PANEL     Status: Abnormal   Collection Time   02/06/12  2:34 AM      Component Value Range Comment   Sodium 136  135 - 145 mEq/L    Potassium 4.1  3.5 - 5.1 mEq/L    Chloride 103  96 - 112 mEq/L    CO2 23  19 - 32 mEq/L    Glucose, Bld 164 (*) 70 - 99 mg/dL    BUN 19  6 - 23 mg/dL    Creatinine, Ser 9.14  0.50 - 1.10 mg/dL    Calcium 8.7  8.4 - 78.2 mg/dL    GFR calc non Af Amer 54 (*) >90 mL/min    GFR calc Af Amer 62 (*) >90 mL/min   HEPARIN LEVEL (UNFRACTIONATED)     Status: Abnormal   Collection Time    02/06/12 11:16 AM      Component Value Range Comment   Heparin Unfractionated 0.19 (*) 0.30 - 0.70 IU/mL     MICRO: 11/10 blood cultures enterococcus 11/11 urine culture: enterococcus (anp S)  Assessment/Plan: 71yo F with hx of bioprosthetic mitral valve with vegetation seen on TEE c/w enterococcal prosthetic valve endocarditis  -  Will change her antibiotic to ampicillin 2gm IV Q 4hr  and gentamicin per pharmacy. Will get baseline audiology exam. If she is unable to tolerate gentamicin, with increasing creatinine or CNS toxicity, will change regimen to amp plus ceftriaxone   - will need at least 6 wks of IV therapy  - please get CT surgery evaluation in the morning  - will order repeat blood cultures to document clearance of bacteremia.  Will provide further recs as more information becomes available.  D[facility] Salvia [facility] MD MPH [facility] [phone] cell

## 2012-02-06 NOTE — Progress Notes (Addendum)
ANTIBIOTIC CONSULT NOTE - Follow Up  Pharmacy Consult for:  Gentamicin Indication: Prosthetic valve enterococcal endocarditis  Allergies  Allergen Reactions  . Codeine     REACTION: nausea  . Codeine Phosphate     REACTION: unspecified   Patient Measurements: Height: 5\' 6"  (167.6 cm) Weight: 210 lb 12.2 oz (95.6 kg) IBW/kg (Calculated) : 59.3  ABW/kg (Calculated): ABW = IBW + 0.4(actual weight - IBW) = 73.8kg  Vital Signs: Temp: 97.7 F (36.5 C) (11/13 1454) Temp src: Oral (11/13 1454) BP: 142/93 mmHg (11/13 1715) Pulse Rate: 75  (11/13 1231) Intake/Output from previous day: 11/12 0701 - 11/13 0700 In: 1859 [P.O.:420; I.V.:639; IV Piggyback:800] Out: 1650 [Urine:1650] Intake/Output from this shift:  Labs:  Basename 02/06/12 0234 02/05/12 0244 02/04/12 0230  WBC 3.9* 3.9* 6.8  HGB 9.2* 9.1* 10.1*  PLT 123* 95* 123*  LABCREA -- -- --  CREATININE 1.01 1.07 1.40*   Estimated Creatinine Clearance: 57.8 ml/min (by C-G formula based on Cr of 1.01).  Medical History: Past Medical History  Diagnosis Date  . Coronary artery disease   . Atrial fibrillation     Now NSR  . GERD (gastroesophageal reflux disease)   . Hyperlipidemia   . 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 Anemia   . Angina   . Shortness of breath     with activity  . Hypertension   . Blood transfusion   . Pneumonia   . H/O hiatal hernia   . Lupus    Medications:  Scheduled:     . ampicillin (OMNIPEN) IV  2 g Intravenous Q4H  . aspirin  324 mg Oral Pre-Cath  . aspirin EC  81 mg Oral Daily  . atorvastatin  40 mg Oral q1800  . budesonide-formoterol  2 puff Inhalation BID  . diazepam  5 mg Oral On Call  . ferrous sulfate  325 mg Oral Q breakfast  . hydroxychloroquine  200 mg Oral BID  . metoprolol  25 mg Oral BID  . pantoprazole  40 mg Oral BID AC  . predniSONE  60 mg Oral Q breakfast  . rOPINIRole  0.5 mg Oral BID  . sodium chloride  3 mL Intravenous Q12H  .  sulfaSALAzine  1,000 mg Oral BID  . [DISCONTINUED] sodium chloride   Intravenous Q28 days  . [DISCONTINUED] acetaminophen  650 mg Oral Q28 days  . [DISCONTINUED] heparin  2,000 Units Intravenous Once  . [COMPLETED] heparin  2,000 Units Intravenous Once  . [DISCONTINUED] piperacillin-tazobactam (ZOSYN)  IV  3.375 g Intravenous Q8H  . [DISCONTINUED] vancomycin  750 mg Intravenous Q12H   Assessment: 71 yo female with GPC/enteroccoccus bacteremia and enterococcal UTI.  She has received IV Azithromycin, Ceftriaxone, Vancomycin and Zosyn.  She has a prosthetic valve and now with 1 blood culture positive for enteroccoccus.  She is receiving IV Ampicillin 2 gm every four hours which is an appropriate dose.  She has normal renal function with an estimated clearance of ~ 58 ml/min.  She is obese, therefore, we will use adjusted weight for dosing aminoglycosides.  11/10 azith>11/12 11/10 roceph>11/12 11/12 vanc > 11/13 11/12 zosyn > 11/13 11/13 Amp. > 11/13 Gent. >  11/10 urine- enteroccoccus (> 100, 000 colonies) 11/10 Blood - enteroccoccus x1 and GPC/clusters 11/13 Blood -   Goal of Therapy:  Desired level (gentamicin, tobramycin) Trough <2 mcg/ml (<1 mcg/ml optimal) Peak 3-4 mcg/ml  Plan:  - Continue current regimen of Ampicillin 2gm every  4 hours - Begin Gentamicin ~ 1mg /kg every 8 hours (80mg  every 8 hr). - Will follow renal function and dose adjust as needed. - Monitor gentamicin levels and adjust as well.  Nadara Mustard, PharmD., MS Clinical Pharmacist Pager:  [phone] Thank you for allowing pharmacy to be part of this patients care team.  02/06/2012 7:05 PM

## 2012-02-06 NOTE — Progress Notes (Signed)
ANTICOAGULATION CONSULT NOTE - Follow Up Consult  Pharmacy Consult for heparin Indication: atrial fibrillation and NSTEMI  Heparin dosing wt=85.5kg  Labs:  Basename 02/06/12 1116 02/06/12 0234 02/05/12 2136 02/05/12 1508 02/05/12 0925 02/05/12 0244 02/04/12 1737 02/04/12 0900 02/04/12 0230 02/03/12 2155 02/03/12 2139  HGB -- 9.2* -- -- -- 9.1* -- -- -- -- --  HCT -- 28.5* -- -- -- 27.4* -- -- 31.7* -- --  PLT -- 123* -- -- -- 95* -- -- 123* -- --  APTT -- -- -- -- 73* 93* 54* -- -- -- --  LABPROT -- -- -- -- -- 17.7* -- -- 20.2* 20.0* --  INR -- -- -- -- -- 1.50* -- -- 1.79* 1.77* --  HEPARINUNFRC 0.19* 0.28* -- -- 0.45 -- -- -- -- -- --  CREATININE -- 1.01 -- -- -- 1.07 -- -- 1.40* -- --  CKTOTAL -- -- 31 39 40 -- -- -- -- -- --  CKMB -- -- 3.5 3.6 3.7 -- -- -- -- -- --  TROPONINI -- -- -- -- -- -- -- 1.03* 1.32* -- 1.40*    Assessment: 71yo female now slightly subtherapeutic (HL=0.19) on heparin for NSTEMI/afib. Patient noted for cath in am.  Goal of Therapy:  Heparin level 0.3-0.7 units/ml   Plan:  -Heparin bolus of 2000 units -Increase infusion to 2000 units/hr -heparin level in 8hrs   Benny Lennert PharmD BCPS 02/06/2012,2:39 PM

## 2012-02-06 NOTE — Progress Notes (Signed)
*  PRELIMINARY RESULTS* Echocardiogram Echocardiogram Transesophageal has been performed.  [name] 02/06/2012, 4:58 PM

## 2012-02-06 NOTE — Progress Notes (Signed)
Subjective:  Patient denies any chest pain or shortness of breath states feels better urine and blood culture positive for enterococci  Objective:  Vital Signs in the last 24 hours: Temp:  [97.5 F (36.4 C)-98.6 F (37 C)] 97.8 F (36.6 C) (11/13 0803) Pulse Rate:  [73-83] 75  (11/13 1231) Resp:  [17-22] 17  (11/13 1231) BP: (109-143)/(50-104) 136/82 mmHg (11/13 1231) SpO2:  [95 %-100 %] 97 % (11/13 0909)  Intake/Output from previous day: 11/12 0701 - 11/13 0700 In: 1859 [P.O.:420; I.V.:639; IV Piggyback:800] Out: 1650 [Urine:1650] Intake/Output from this shift: Total I/O In: 437.5 [P.O.:150; I.V.:137.5; IV Piggyback:150] Out: 700 [Urine:700]  Physical Exam: Neck: no adenopathy, no carotid bruit, no JVD and supple, symmetrical, trachea midline Lungs: clear to auscultation bilaterally Heart: irregularly irregular rhythm, S1, S2 normal and Soft systolic murmur noted Abdomen: soft, non-tender; bowel sounds normal; no masses,  no organomegaly Extremities: extremities normal, atraumatic, no cyanosis or edema  Lab Results:  Basename 02/06/12 0234 02/05/12 0244  WBC 3.9* 3.9*  HGB 9.2* 9.1*  PLT 123* 95*    Basename 02/06/12 0234 02/05/12 0244  NA 136 134*  K 4.1 3.6  CL 103 101  CO2 23 24  GLUCOSE 164* 168*  BUN 19 26*  CREATININE 1.01 1.07    Basename 02/04/12 0900 02/04/12 0230  TROPONINI 1.03* 1.32*   Hepatic Function Panel  Basename 02/03/12 2155  PROT 6.0  ALBUMIN 2.5*  AST 18  ALT 12  ALKPHOS 71  BILITOT 0.9  BILIDIR --  IBILI --    Basename 02/04/12 0230  CHOL 88   No results found for this basename: PROTIME in the last 72 hours  Imaging: Imaging results have been reviewed and No results found.  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

## 2012-02-06 NOTE — Progress Notes (Signed)
ANTICOAGULATION CONSULT NOTE - Follow Up Consult  Pharmacy Consult for heparin Indication: atrial fibrillation and NSTEMI  Labs:  Basename 02/06/12 0234 02/05/12 2136 02/05/12 1508 02/05/12 0925 02/05/12 0244 02/04/12 1737 02/04/12 0900 02/04/12 0230 02/03/12 2155 02/03/12 2139  HGB 9.2* -- -- -- 9.1* -- -- -- -- --  HCT 28.5* -- -- -- 27.4* -- -- 31.7* -- --  PLT 123* -- -- -- 95* -- -- 123* -- --  APTT -- -- -- 73* 93* 54* -- -- -- --  LABPROT -- -- -- -- 17.7* -- -- 20.2* 20.0* --  INR -- -- -- -- 1.50* -- -- 1.79* 1.77* --  HEPARINUNFRC 0.28* -- -- 0.45 -- 0.82* -- -- -- --  CREATININE -- -- -- -- 1.07 -- -- 1.40* 1.22* --  CKTOTAL -- 31 39 40 -- -- -- -- -- --  CKMB -- 3.5 3.6 3.7 -- -- -- -- -- --  TROPONINI -- -- -- -- -- -- 1.03* 1.32* -- 1.40*    Assessment: 71yo female now slightly subtherapeutic on heparin after one level at goal at current rate.  Goal of Therapy:  Heparin level 0.3-0.7 units/ml   Plan:  Will increase heparin gtt by 1 unit/kg/hr to 1750 units/hr and check level in ~8hr.  Colleen Can PharmD BCPS 02/06/2012,3:47 AM

## 2012-02-07 ENCOUNTER — Encounter (HOSPITAL_COMMUNITY)
Admission: EM | Disposition: A | Discharge status: Home / Self Care | Payer: Self-pay | Source: Home / Self Care | Attending: Cardiology

## 2012-02-07 ENCOUNTER — Encounter (HOSPITAL_COMMUNITY): Payer: Self-pay | Admitting: Cardiovascular Disease

## 2012-02-07 ENCOUNTER — Inpatient Hospital Stay (HOSPITAL_COMMUNITY): Payer: Medicare Other

## 2012-02-07 DIAGNOSIS — I059 Rheumatic mitral valve disease, unspecified: Secondary | ICD-10-CM

## 2012-02-07 LAB — BASIC METABOLIC PANEL
BUN: 16 mg/dL (ref 6–23)
Creatinine, Ser: 0.91 mg/dL (ref 0.50–1.10)
GFR calc Af Amer: 71 mL/min — ABNORMAL LOW (ref >90)
GFR calc non Af Amer: 61 mL/min — ABNORMAL LOW (ref >90)
Potassium: 4.5 mEq/L (ref 3.5–5.1)

## 2012-02-07 LAB — CULTURE, BLOOD (ROUTINE X 2)

## 2012-02-07 LAB — CBC
MCH: 30.8 pg (ref 26.0–34.0)
MCV: 95 fL (ref 78.0–100.0)
Platelets: 122 10*3/uL — ABNORMAL LOW (ref 150–400)
RDW: 13.9 % (ref 11.5–15.5)
WBC: 4.4 10*3/uL (ref 4.0–10.5)

## 2012-02-07 LAB — HEPARIN LEVEL (UNFRACTIONATED)
Heparin Unfractionated: 0.29 IU/mL — ABNORMAL LOW (ref 0.30–0.70)
Heparin Unfractionated: 0.56 IU/mL (ref 0.30–0.70)

## 2012-02-07 LAB — GENTAMICIN LEVEL, TROUGH: Gentamicin Trough: 1.4 ug/mL (ref 0.5–2.0)

## 2012-02-07 SURGERY — LEFT HEART CATHETERIZATION WITH CORONARY ANGIOGRAM
Anesthesia: LOCAL

## 2012-02-07 MED ORDER — SODIUM CHLORIDE 0.9 % IJ SOLN
10.0000 mL | Freq: Two times a day (BID) | INTRAMUSCULAR | Status: DC
  Administered 2012-02-07 – 2012-02-14 (×14): 10 mL
  Administered 2012-02-15: 20 mL

## 2012-02-07 MED ORDER — SODIUM CHLORIDE 0.9 % IJ SOLN
10.0000 mL | INTRAMUSCULAR | Status: DC | PRN
  Administered 2012-02-17: 20 mL

## 2012-02-07 MED ORDER — POTASSIUM CHLORIDE CRYS ER 20 MEQ PO TBCR
20.0000 meq | EXTENDED_RELEASE_TABLET | Freq: Every day | ORAL | Status: DC
  Administered 2012-02-07 – 2012-02-10 (×4): 20 meq via ORAL
  Filled 2012-02-07 (×4): qty 1

## 2012-02-07 MED ORDER — GENTAMICIN IN SALINE 1.6-0.9 MG/ML-% IV SOLN
80.0000 mg | Freq: Two times a day (BID) | INTRAVENOUS | Status: DC
  Administered 2012-02-07: 80 mg via INTRAVENOUS
  Filled 2012-02-07 (×2): qty 50

## 2012-02-07 MED ORDER — FUROSEMIDE 40 MG PO TABS
40.0000 mg | ORAL_TABLET | Freq: Every day | ORAL | Status: DC
  Administered 2012-02-07 – 2012-02-09 (×3): 40 mg via ORAL
  Filled 2012-02-07 (×3): qty 1

## 2012-02-07 NOTE — Progress Notes (Signed)
IVT called and claimed that PICC line can be used.

## 2012-02-07 NOTE — Progress Notes (Signed)
Subjective:  Patient denies any chest pain or shortness of breath. Results of TEE noted. Patient denies any fever or chills. Cardiac cath possible PTCA stenting consult today in view of TEE findings. Discussed with patient's daughter and patient at length about TEE findings. Will get cardiovascular thoracic surgical consult. Will be scheduled for cath early next week. Objective:  Vital Signs in the last 24 hours: Temp:  [97.4 F (36.3 C)-98.5 F (36.9 C)] 97.4 F (36.3 C) (11/14 0741) Pulse Rate:  [75-85] 85  (11/14 0741) Resp:  [17-42] 19  (11/14 0741) BP: (125-181)/(66-112) 159/112 mmHg (11/14 0741) SpO2:  [89 %-100 %] 95 % (11/14 0836) Weight:  [99.9 kg (220 lb 3.8 oz)] 99.9 kg (220 lb 3.8 oz) (11/14 0556)  Intake/Output from previous day: 11/13 0701 - 11/14 0700 In: 1457.7 [P.O.:330; I.V.:677.7; IV Piggyback:450] Out: 851 [Urine:850; Stool:1] Intake/Output from this shift: Total I/O In: 112 [I.V.:62; IV Piggyback:50] Out: -   Physical Exam: Neck: no adenopathy, no carotid bruit, no JVD and supple, symmetrical, trachea midline Lungs: clear to auscultation bilaterally Heart: irregularly irregular rhythm, S1, S2 normal and Soft systolic murmur noted Abdomen: soft, non-tender; bowel sounds normal; no masses,  no organomegaly Extremities: No clubbing cyanosis 2+ edema noted  Lab Results:  Basename 02/07/12 0405 02/06/12 0234  WBC 4.4 3.9*  HGB 9.3* 9.2*  PLT 122* 123*    Basename 02/07/12 0405 02/06/12 0234  NA 134* 136  K 4.5 4.1  CL 102 103  CO2 23 23  GLUCOSE 134* 164*  BUN 16 19  CREATININE 0.91 1.01   No results found for this basename: TROPONINI:2,CK,MB:2 in the last 72 hours Hepatic Function Panel No results found for this basename: PROT,ALBUMIN,AST,ALT,ALKPHOS,BILITOT,BILIDIR,IBILI in the last 72 hours No results found for this basename: CHOL in the last 72 hours No results found for this basename: PROTIME in the last 72 hours  Imaging: Imaging results  have been reviewed and Dg Chest Port 1 View  02/07/2012  *RADIOLOGY REPORT*  Clinical Data: PICC line placement  PORTABLE CHEST - 1 VIEW  Comparison: Portable exam 1517 hours compared to 02/03/2012  Findings: Right arm PICC line, tip projecting over SVC above cavoatrial junction. Enlargement of cardiac silhouette with pulmonary vascular congestion. No acute failure or consolidation. Minimal bibasilar atelectasis.  IMPRESSION: Tip of right arm PICC line projects over SVC. Enlargement of cardiac silhouette with pulmonary vascular congestion. Minimal bibasilar atelectasis.   Original Report Authenticated By: Ulyses Southward, M.D.     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

## 2012-02-07 NOTE — Progress Notes (Signed)
ANTIBIOTIC CONSULT NOTE - Follow Up  Pharmacy Consult for:  Gentamicin Indication: Bio-prosthetic valve enterococcal endocarditis  Allergies  Allergen Reactions  . Codeine     REACTION: nausea  . Codeine Phosphate     REACTION: unspecified   Patient Measurements: Height: 5\' 6"  (167.6 cm) Weight: 220 lb 3.8 oz (99.9 kg) IBW/kg (Calculated) : 59.3  ABW/kg (Calculated): ABW = IBW + 0.4(actual weight - IBW) = 73.8kg  Vital Signs: Temp: 97.4 F (36.3 C) (11/14 1136) Temp src: Oral (11/14 1136) BP: 165/98 mmHg (11/14 1136) Pulse Rate: 72  (11/14 1136) Intake/Output from previous day: 11/13 0701 - 11/14 0700 In: 1457.7 [P.O.:330; I.V.:677.7; IV Piggyback:450] Out: 851 [Urine:850; Stool:1] Intake/Output from this shift:  Labs:  Basename 02/07/12 0405 02/06/12 0234 02/05/12 0244  WBC 4.4 3.9* 3.9*  HGB 9.3* 9.2* 9.1*  PLT 122* 123* 95*  LABCREA -- -- --  CREATININE 0.91 1.01 1.07   Estimated Creatinine Clearance: 65.6 ml/min (by C-G formula based on Cr of 0.91).  Medical History: Past Medical History  Diagnosis Date  . Coronary artery disease   . Atrial fibrillation     Now NSR  . GERD (gastroesophageal reflux disease)   . Hyperlipidemia   . 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 Anemia   . Angina   . Shortness of breath     with activity  . Hypertension   . Blood transfusion   . Pneumonia   . H/O hiatal hernia   . Lupus    Medications:  Scheduled:     . ampicillin (OMNIPEN) IV  2 g Intravenous Q4H  . [COMPLETED] aspirin  324 mg Oral Pre-Cath  . aspirin EC  81 mg Oral Daily  . atorvastatin  40 mg Oral q1800  . budesonide-formoterol  2 puff Inhalation BID  . diazepam  5 mg Oral On Call  . ferrous sulfate  325 mg Oral Q breakfast  . gentamicin  80 mg Intravenous Q12H  . hydroxychloroquine  200 mg Oral BID  . metoprolol  25 mg Oral BID  . pantoprazole  40 mg Oral BID AC  . predniSONE  60 mg Oral Q breakfast  . rOPINIRole  0.5 mg  Oral BID  . sodium chloride  3 mL Intravenous Q12H  . sulfaSALAzine  1,000 mg Oral BID  . [DISCONTINUED] sodium chloride   Intravenous Q28 days  . [DISCONTINUED] acetaminophen  650 mg Oral Q28 days  . [DISCONTINUED] gentamicin  80 mg Intravenous Q8H  . [DISCONTINUED] heparin  2,000 Units Intravenous Once  . [COMPLETED] heparin  2,000 Units Intravenous Once  . [DISCONTINUED] piperacillin-tazobactam (ZOSYN)  IV  3.375 g Intravenous Q8H  . [DISCONTINUED] vancomycin  750 mg Intravenous Q12H   Assessment: 71 yo female with enterococcal endocarditis.  She has received IV Azithromycin, Ceftriaxone, Vancomycin and Zosyn this admission.  She has a bioprosthetic valve and now with blood cultures positive for enteroccoccus and vegetation on TEE.  She is receiving IV Ampicillin 2 gm every four hours which is an appropriate dose.  She has normal renal function with an estimated clearance of ~ 65 ml/min. This is ~50% improvement over her renal function on admission.  She is obese, therefore, adjusted weight for dosing aminoglycosides was used.  Gentamicin was started at 80mg  every 8 hours. After talking with Dr. Drue Second and the rest of the ID team it was felt that while the original dose is technically appropriate the patient is at a  high risk of accumulation based on age and worse renal function on admission. Based on this we are decreasing the frequency and ordering levels to help determine proper dosing. These levels will be difficult to interpret due to not being at steady state and the recent interval change.  11/10 azith>11/12 11/10 roceph>11/12 11/12 vanc > 11/13 11/12 zosyn > 11/13 11/13 Amp. > 11/13 Gent. >  11/10 urine- enteroccoccus (> 100, 000 colonies) 11/10 Blood - enteroccoccus x1 and GPC/clusters 11/13 Blood -   Goal of Therapy:  Desired level gentamicin Trough <1 mcg/ml  Peak 3-4 mcg/ml  Plan:  - Continue current regimen of Ampicillin 2gm every 4 hours - Change Gentamicin to ~  1mg /kg every 12 hours (80mg  every 12 hr). - Will follow renal function and dose adjust as needed. - Monitor gentamicin levels and adjust as well.   Vania Rea. Darin Engels.D. Clinical Pharmacist Pager [phone] Phone [phone] 02/07/2012 1:02 PM

## 2012-02-07 NOTE — Progress Notes (Signed)
Peripherally Inserted Central Catheter/Midline Placement  The IV Nurse has discussed with the patient and/or persons authorized to consent for the patient, the purpose of this procedure and the potential benefits and risks involved with this procedure.  The benefits include less needle sticks, lab draws from the catheter and patient may be discharged home with the catheter.  Risks include, but not limited to, infection, bleeding, blood clot (thrombus formation), and puncture of an artery; nerve damage and irregular heat beat.  Alternatives to this procedure were also discussed.  PICC/Midline Placement Documentation  PICC / Midline Double Lumen 02/07/12 PICC Right Basilic (Active)       Stacie Glaze Horton 02/07/2012, 3:05 PM

## 2012-02-07 NOTE — Progress Notes (Signed)
[facility]    Date of Admission:  02/03/2012   Total days of antibiotics 5        Day 1 ampicillin and gent        2 days of  vanco and piptazo        2 Days of azithromycin and ceftriaxone (D/c'd 11/11)    ID: [name] is a 71 y.o. 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  Principal Problem:  *NSTEMI (non-ST elevated myocardial infarction)    Subjective: Afebrile. Feeling better since admit. Had discussion with Dr. Dorris Fetch who felt medical management was the best for her.  Medications:     . ampicillin (OMNIPEN) IV  2 g Intravenous Q4H  . [COMPLETED] aspirin  324 mg Oral Pre-Cath  . aspirin EC  81 mg Oral Daily  . atorvastatin  40 mg Oral q1800  . budesonide-formoterol  2 puff Inhalation BID  . diazepam  5 mg Oral On Call  . ferrous sulfate  325 mg Oral Q breakfast  . gentamicin  80 mg Intravenous Q12H  . hydroxychloroquine  200 mg Oral BID  . metoprolol  25 mg Oral BID  . pantoprazole  40 mg Oral BID AC  . predniSONE  60 mg Oral Q breakfast  . rOPINIRole  0.5 mg Oral BID  . sodium chloride  10-40 mL Intracatheter Q12H  . sodium chloride  3 mL Intravenous Q12H  . sulfaSALAzine  1,000 mg Oral BID  . [DISCONTINUED] sodium chloride   Intravenous Q28 days  . [DISCONTINUED] acetaminophen  650 mg Oral Q28 days  . [DISCONTINUED] gentamicin  80 mg Intravenous Q8H  . [COMPLETED] heparin  2,000 Units Intravenous Once  . [DISCONTINUED] piperacillin-tazobactam (ZOSYN)  IV  3.375 g Intravenous Q8H  . [DISCONTINUED] vancomycin  750 mg Intravenous Q12H    Objective: Vital signs in last 24 hours: Temp:  [97.4 F (36.3 C)-98.3 F (36.8 C)] 97.4 F (36.3 C) (11/14 1136) Pulse Rate:  [72-85] 72  (11/14 1136) Resp:  [18-42] 18  (11/14 1543) BP: (125-165)/(66-112) 149/85 mmHg (11/14 1543) SpO2:  [89 %-100 %] 97 % (11/14 1136) Weight:  [220 lb 3.8 oz (99.9 kg)] 220 lb 3.8 oz (99.9 kg) (11/14  0556)  SpO2: [89 %-100 %] 95 % (11/13 1650)  Gen= a x o by 3, in NAD Neck: no adenopathy, no carotid bruit, no JVD and supple, symmetrical, trachea midline  Lungs: CTAB, no wheezing or rhonchi  Heart: irregularly irregular , S1, S2 normal and Soft systolic murmur noted  Abdomen: soft, non-tender; bowel sounds normal; no masses, no organomegaly  Extremities: extremities normal, atraumatic, no cyanosis or edema. Right PICC line c/d/i   Lab Results  Basename 02/07/12 0405 02/06/12 0234  WBC 4.4 3.9*  HGB 9.3* 9.2*  HCT 28.7* 28.5*  NA 134* 136  K 4.5 4.1  CL 102 103  CO2 23 23  BUN 16 19  CREATININE 0.91 1.01  GLU -- --   Liver Panel No results found for this basename: PROT:2,ALBUMIN:2,AST:2,ALT:2,ALKPHOS:2,BILITOT:2,BILIDIR:2,IBILI:2 in the last 72 hours Sedimentation Rate No results found for this basename: ESRSEDRATE in the last 72 hours C-Reactive Protein No results found for this basename: CRP:2 in the last 72 hours  Microbiology:  Studies/Results: Dg Chest Port 1 View  02/07/2012  *RADIOLOGY REPORT*  Clinical Data: PICC line placement  PORTABLE CHEST - 1 VIEW  Comparison: Portable exam 1517 hours compared to 02/03/2012  Findings: Right arm PICC  line, tip projecting over SVC above cavoatrial junction. Enlargement of cardiac silhouette with pulmonary vascular congestion. No acute failure or consolidation. Minimal bibasilar atelectasis.  IMPRESSION: Tip of right arm PICC line projects over SVC. Enlargement of cardiac silhouette with pulmonary vascular congestion. Minimal bibasilar atelectasis.   Original Report Authenticated By: Ulyses Southward, M.D.     Assessment/Plan: 71 yo woman with enterococcal  Native mitral valve endocarditis with possible tricuspid involvement, since she has new TR regurgitant, but no vegetation was seen on echo. Not on prosthetic aortic valve,  - currently on ampicillin 2gm IV q4hr plus gentamicin. We will keep gentamicin on until we can document  clearance of bacteremia, then switch to ceftriaxone 2gm IV Q12hr to minimize nephrotoxocity. Will check gent peak and trough at the 4th dose.  - she had picc line placed today in anticipation of antibiotics, however, we did not document clearance of her bacteremia. Await for results from her blood cultures from 11/13 to see that she has cleared her bacteremia.  - Dr. Dorris Fetch feels she would be a poor candidate for surgery and recommends medical management of enterococcal endocarditis and her MR/ TR and CHF can be managed medically.   - we will treat for at least 6 wks of IV antibiotics, last day of antibiotics Dec 24th.   and have her follow up in ID clinic in 4-6 wks.   Drue Second [facility] Cell: [phone] Pager: [phone]  02/07/2012, 4:16 PM

## 2012-02-07 NOTE — Progress Notes (Signed)
ANTICOAGULATION CONSULT NOTE - Follow Up Consult  Pharmacy Consult for heparin Indication: atrial fibrillation and NSTEMI  Labs:  Basename 02/07/12 0405 02/06/12 1116 02/06/12 0234 02/05/12 2136 02/05/12 1508 02/05/12 0925 02/05/12 0244 02/04/12 1737 02/04/12 0900  HGB -- -- 9.2* -- -- -- 9.1* -- --  HCT -- -- 28.5* -- -- -- 27.4* -- --  PLT -- -- 123* -- -- -- 95* -- --  APTT -- -- -- -- -- 73* 93* 54* --  LABPROT 17.1* -- -- -- -- -- 17.7* -- --  INR 1.43 -- -- -- -- -- 1.50* -- --  HEPARINUNFRC 0.29* 0.19* 0.28* -- -- -- -- -- --  CREATININE -- -- 1.01 -- -- -- 1.07 -- --  CKTOTAL -- -- -- 31 39 40 -- -- --  CKMB -- -- -- 3.5 3.6 3.7 -- -- --  TROPONINI -- -- -- -- -- -- -- -- 1.03*    Assessment: 71yo female remains subtherapeutic on heparin after rate increases though now close to goal; scheduled for cath this am.  Goal of Therapy:  Heparin level 0.3-0.7 units/ml   Plan:  Will increase heparin gtt by 1 unit/kg/hr to 2100 units/hr and f/u after cath.  Colleen Can PharmD BCPS 02/07/2012,5:17 AM

## 2012-02-07 NOTE — Progress Notes (Signed)
ANTICOAGULATION CONSULT NOTE - Follow Up Consult  Pharmacy Consult for heparin Indication: atrial fibrillation and NSTEMI  Heparin dosing wt=85.5kg  Labs:  Basename 02/07/12 1142 02/07/12 0405 02/06/12 1116 02/06/12 0234 02/05/12 2136 02/05/12 1508 02/05/12 0925 02/05/12 0244 02/04/12 1737  HGB -- 9.3* -- 9.2* -- -- -- -- --  HCT -- 28.7* -- 28.5* -- -- -- 27.4* --  PLT -- 122* -- 123* -- -- -- 95* --  APTT -- -- -- -- -- -- 73* 93* 54*  LABPROT -- 17.1* -- -- -- -- -- 17.7* --  INR -- 1.43 -- -- -- -- -- 1.50* --  HEPARINUNFRC 0.56 0.29* 0.19* -- -- -- -- -- --  CREATININE -- 0.91 -- 1.01 -- -- -- 1.07 --  CKTOTAL -- -- -- -- 31 39 40 -- --  CKMB -- -- -- -- 3.5 3.6 3.7 -- --  TROPONINI -- -- -- -- -- -- -- -- --    Assessment: 71yo female  on heparin for NSTEMI/afib with therapeutic heparin level on 2100 units/hr. Patient originally planned for cath today, but this was cancelled.  CBC stable, no bleeding noted per chart notes.  Goal of Therapy:  Heparin level 0.3-0.7 units/ml   Plan:  1. Continue IV heparin at current rate. 2. Daily heparin level and CBC. 3. F/U plans for cath, or TCTS   Gardner Candle PharmD BCPS 02/07/2012,3:03 PM

## 2012-02-07 NOTE — Progress Notes (Signed)
[facility]  Patient Status: New  [facility] is providing the following services: RN and Home Infusion Services (teaching and education will be done by nurse in the home with patient and caregiver)  If patient discharges after hours, please call [phone].   [name] 02/07/2012, 3:07 PM

## 2012-02-07 NOTE — Progress Notes (Signed)
Dr. Sharyn Lull notified undersigned that cardiac cath. is cancelled and to order diet. Also gave order to insert  PICC line, IVT made aware.

## 2012-02-07 NOTE — Consult Note (Signed)
Reason for Consult:mitral and tricuspid endocarditis Referring Physician: Dr. [name] is an 71 y.o. female.  HPI: [name] with history of CABG x 1(LIMA to LAD) and bioprosthetic AVR in 2003. She also has a history of mixed connective tissue disease, pulmonary fibrosis, and chronic atrial fibrillation. She has significant SOB due to her pulmonary fibrosis. She had worsening SOB and wheezing about 4 weeks ago. She was treated for pneumonia with moxifloxicin and an increase in her prednisone dosage. She did have some improvement but then began to have worsening SOB again. She complains that she was unable to engage in almost any activity without getting SOB. She also complained of generalized weakness and nausea. She was admitted after her daughter found her confused at home.  On admission she was noted to have a low grade temperature. Her UA showed WBC, nitrites and LE. She had blood and urine cultures which grew enterococcus. Her troponins were elevated. She underwent echo which showed good function of the prosthetic aortic valve, but severe MR and TR and a vegetation on the mitral valve.  She was initially started on vanco and zosyn , but has now been changed to ampicillin and gentamicin. She is afebrile and feels better than she did on admission.  Past Medical History  Diagnosis Date  . Coronary artery disease   . Atrial fibrillation     Now NSR  . GERD (gastroesophageal reflux disease)   . Hyperlipidemia   . 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 Anemia   . Angina   . Shortness of breath     with activity  . Hypertension   . Blood transfusion   . Pneumonia   . H/O hiatal hernia   . Lupus     Past Surgical History  Procedure Date  . Cardiac catheterization   . Cardiac valve replacement     2006  . Coronary artery bypass graft   . Givens capsule study 04/09/2011    Procedure: GIVENS CAPSULE STUDY;  Surgeon: Theda Belfast, MD;  Location: [facility]  ENDOSCOPY;  Service: Endoscopy;  Laterality: N/A;  . Tee without cardioversion 02/06/2012    Procedure: TRANSESOPHAGEAL ECHOCARDIOGRAM (TEE);  Surgeon: Ricki Rodriguez, MD;  Location: [facility] ENDOSCOPY;  Service: Cardiovascular;  Laterality: N/A;    History reviewed. No pertinent family history.  Social History:  reports that she has never smoked. She has never used smokeless tobacco. She reports that she does not drink alcohol. Her drug history not on file.  Allergies:  Allergies  Allergen Reactions  . Codeine     REACTION: nausea  . Codeine Phosphate     REACTION: unspecified    Medications:  Scheduled:   . ampicillin (OMNIPEN) IV  2 g Intravenous Q4H  . [COMPLETED] aspirin  324 mg Oral Pre-Cath  . aspirin EC  81 mg Oral Daily  . atorvastatin  40 mg Oral q1800  . budesonide-formoterol  2 puff Inhalation BID  . diazepam  5 mg Oral On Call  . ferrous sulfate  325 mg Oral Q breakfast  . gentamicin  80 mg Intravenous Q12H  . hydroxychloroquine  200 mg Oral BID  . metoprolol  25 mg Oral BID  . pantoprazole  40 mg Oral BID AC  . predniSONE  60 mg Oral Q breakfast  . rOPINIRole  0.5 mg Oral BID  . sodium chloride  3 mL Intravenous Q12H  . sulfaSALAzine  1,000 mg Oral  BID  . [DISCONTINUED] sodium chloride   Intravenous Q28 days  . [DISCONTINUED] acetaminophen  650 mg Oral Q28 days  . [DISCONTINUED] gentamicin  80 mg Intravenous Q8H  . [DISCONTINUED] heparin  2,000 Units Intravenous Once  . [COMPLETED] heparin  2,000 Units Intravenous Once  . [DISCONTINUED] piperacillin-tazobactam (ZOSYN)  IV  3.375 g Intravenous Q8H  . [DISCONTINUED] vancomycin  750 mg Intravenous Q12H    Results for orders placed during the hospital encounter of 02/03/12 (from the past 48 hour(s))  CK TOTAL AND CKMB     Status: Normal   Collection Time   02/05/12  3:08 PM      Component Value Range Comment   Total CK 39  7 - 177 U/L    CK, MB 3.6  0.3 - 4.0 ng/mL    Relative Index RELATIVE INDEX IS INVALID   0.0 - 2.5   CK TOTAL AND CKMB     Status: Normal   Collection Time   02/05/12  9:36 PM      Component Value Range Comment   Total CK 31  7 - 177 U/L    CK, MB 3.5  0.3 - 4.0 ng/mL    Relative Index RELATIVE INDEX IS INVALID  0.0 - 2.5   VITAMIN B12     Status: Normal   Collection Time   02/05/12  9:36 PM      Component Value Range Comment   Vitamin B-12 353  211 - 911 pg/mL   IRON AND TIBC     Status: Abnormal   Collection Time   02/05/12  9:36 PM      Component Value Range Comment   Iron 34 (*) 42 - 135 ug/dL    TIBC 161  096 - 045 ug/dL    Saturation Ratios 13 (*) 20 - 55 %    UIBC 219  125 - 400 ug/dL   FERRITIN     Status: Normal   Collection Time   02/05/12  9:36 PM      Component Value Range Comment   Ferritin 234  10 - 291 ng/mL   RETICULOCYTES     Status: Abnormal   Collection Time   02/05/12  9:36 PM      Component Value Range Comment   Retic Ct Pct 1.6  0.4 - 3.1 %    RBC. 3.10 (*) 3.87 - 5.11 MIL/uL    Retic Count, Manual 49.6  19.0 - 186.0 K/uL   CBC     Status: Abnormal   Collection Time   02/06/12  2:34 AM      Component Value Range Comment   WBC 3.9 (*) 4.0 - 10.5 K/uL    RBC 3.04 (*) 3.87 - 5.11 MIL/uL    Hemoglobin 9.2 (*) 12.0 - 15.0 g/dL    HCT 40.9 (*) 81.1 - 46.0 %    MCV 93.8  78.0 - 100.0 fL    MCH 30.3  26.0 - 34.0 pg    MCHC 32.3  30.0 - 36.0 g/dL    RDW 91.4  78.2 - 95.6 %    Platelets 123 (*) 150 - 400 K/uL   HEPARIN LEVEL (UNFRACTIONATED)     Status: Abnormal   Collection Time   02/06/12  2:34 AM      Component Value Range Comment   Heparin Unfractionated 0.28 (*) 0.30 - 0.70 IU/mL   BASIC METABOLIC PANEL     Status: Abnormal   Collection Time   02/06/12  2:34 AM      Component Value Range Comment   Sodium 136  135 - 145 mEq/L    Potassium 4.1  3.5 - 5.1 mEq/L    Chloride 103  96 - 112 mEq/L    CO2 23  19 - 32 mEq/L    Glucose, Bld 164 (*) 70 - 99 mg/dL    BUN 19  6 - 23 mg/dL    Creatinine, Ser 6.38  0.50 - 1.10 mg/dL     Calcium 8.7  8.4 - 10.5 mg/dL    GFR calc non Af Amer 54 (*) >90 mL/min    GFR calc Af Amer 62 (*) >90 mL/min   HEPARIN LEVEL (UNFRACTIONATED)     Status: Abnormal   Collection Time   02/06/12 11:16 AM      Component Value Range Comment   Heparin Unfractionated 0.19 (*) 0.30 - 0.70 IU/mL   CBC     Status: Abnormal   Collection Time   02/07/12  4:05 AM      Component Value Range Comment   WBC 4.4  4.0 - 10.5 K/uL    RBC 3.02 (*) 3.87 - 5.11 MIL/uL    Hemoglobin 9.3 (*) 12.0 - 15.0 g/dL    HCT 75.6 (*) 43.3 - 46.0 %    MCV 95.0  78.0 - 100.0 fL    MCH 30.8  26.0 - 34.0 pg    MCHC 32.4  30.0 - 36.0 g/dL    RDW 29.5  18.8 - 41.6 %    Platelets 122 (*) 150 - 400 K/uL   HEPARIN LEVEL (UNFRACTIONATED)     Status: Abnormal   Collection Time   02/07/12  4:05 AM      Component Value Range Comment   Heparin Unfractionated 0.29 (*) 0.30 - 0.70 IU/mL   BASIC METABOLIC PANEL     Status: Abnormal   Collection Time   02/07/12  4:05 AM      Component Value Range Comment   Sodium 134 (*) 135 - 145 mEq/L    Potassium 4.5  3.5 - 5.1 mEq/L    Chloride 102  96 - 112 mEq/L    CO2 23  19 - 32 mEq/L    Glucose, Bld 134 (*) 70 - 99 mg/dL    BUN 16  6 - 23 mg/dL    Creatinine, Ser 6.06  0.50 - 1.10 mg/dL    Calcium 8.8  8.4 - 30.1 mg/dL    GFR calc non Af Amer 61 (*) >90 mL/min    GFR calc Af Amer 71 (*) >90 mL/min   PROTIME-INR     Status: Abnormal   Collection Time   02/07/12  4:05 AM      Component Value Range Comment   Prothrombin Time 17.1 (*) 11.6 - 15.2 seconds    INR 1.43  0.00 - 1.49   OCCULT BLOOD X 1 CARD TO LAB, STOOL     Status: Normal   Collection Time   02/07/12  7:31 AM      Component Value Range Comment   Fecal Occult Bld POSITIVE     HEPARIN LEVEL (UNFRACTIONATED)     Status: Normal   Collection Time   02/07/12 11:42 AM      Component Value Range Comment   Heparin Unfractionated 0.56  0.30 - 0.70 IU/mL     No results found.  Review of Systems  Constitutional:  Positive for malaise/fatigue.  Respiratory: Positive for cough, sputum production, shortness  of breath and wheezing.   Cardiovascular: Positive for leg swelling. Negative for chest pain.  Gastrointestinal: Positive for nausea. Negative for vomiting.  Genitourinary: Positive for urgency.  Neurological: Positive for weakness. Dizziness: fusion. Sensory change: confusion.       Confusion  All other systems reviewed and are negative.   Blood pressure 165/98, pulse 72, temperature 97.4 F (36.3 C), temperature source Oral, resp. rate 19, height 5\' 6"  (1.676 m), weight 220 lb 3.8 oz (99.9 kg), SpO2 97.00%. Physical Exam  Vitals reviewed. Constitutional: She is oriented to person, place, and time. No distress.       Morbidly obese  HENT:  Head: Normocephalic and atraumatic.  Eyes: EOM are normal. Pupils are equal, round, and reactive to light.  Neck: Neck supple. No JVD present. No thyromegaly present.  Cardiovascular: Normal rate and regular rhythm.  Exam reveals gallop (S4).   Murmur (faint systolic) heard. Respiratory:       Diminished BS bilaterally no wheezes  GI: Soft. There is no tenderness.  Musculoskeletal: She exhibits edema (1+).  Lymphadenopathy:    She has no cervical adenopathy.  Neurological: She is alert and oriented to person, place, and time. No cranial nerve deficit.  Skin: Skin is warm and dry.   TEE 02/06/12 Study Conclusions  - Left ventricle: There was mild concentric hypertrophy. Systolic function was normal. The estimated ejection fraction was in the range of 60% to 65%. Wall motion was normal; there were no regional wall motion abnormalities. - Mitral valve: A bioprosthesis was present. Leaflet separation was reduced. There was an apparent, medium-sized, pedunculated, highly mobile vegetation on the anterior leaflet. Severe regurgitation. - Left atrium: The atrium was dilated. There was an apparent, medium-sized, broad-based, mobilethrombusin the atrial  cavity or appendage. There was spontaneous echo contrast ("smoke"). - Right atrium: No evidence of thrombus in the atrial cavity or appendage. - Tricuspid valve: Severe regurgitation. Transesophageal echocardiography. 2D and color Doppler. Patient status: Inpatient. Location: Endoscopy.     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 She may also have tricuspid involvement, as that valve is regurgitant, but no vegetation was seen on echo. She has a prosthetic aortic valve, but there is no echocardiographic evidence of endocarditis on that valve presently.  Currently, she does not have an indication for surgery. She is on an appropriate antibiotic regimen with a planned course of 6 weeks. Hopefully that will clear her endocarditis and her MR/ TR and CHF can be managed medically.   Unfortunately, it is not uncommon for enterococcal endocarditis to progress despite antibiotics, which raises the question of whether she would be an operative candidate should that occur. She has multiple co-morbidities including a connective tissue disorder for which she is on prednisone and methotrexate. She has had a prior AVR/ CABG. She is morbidly obese and has very limited mobility. She also has had pleuropericarditis. Most importantly she has also pulmonary fibrosis. I think her operative risk for a redo for MVR, TVR, possible redo AVR and possible redo CABG is prohibitive. She is unlikely to survive such an operation, and even if she did there is almost no hope for a good outcome.  I discussed these issues in frank terms with [name] and her daughter.  HENDRICKSON,STEVEN C 02/07/2012, 2:27 PM

## 2012-02-08 LAB — BASIC METABOLIC PANEL
CO2: 24 mEq/L (ref 19–32)
Chloride: 105 mEq/L (ref 96–112)
Creatinine, Ser: 1.1 mg/dL (ref 0.50–1.10)
GFR calc Af Amer: 56 mL/min — ABNORMAL LOW (ref >90)
Sodium: 137 mEq/L (ref 135–145)

## 2012-02-08 LAB — GENTAMICIN LEVEL, RANDOM: Gentamicin Rm: 12.4 ug/mL

## 2012-02-08 LAB — CBC
HCT: 28.7 % — ABNORMAL LOW (ref 36.0–46.0)
Hemoglobin: 9.2 g/dL — ABNORMAL LOW (ref 12.0–15.0)
RBC: 3.01 MIL/uL — ABNORMAL LOW (ref 3.87–5.11)
WBC: 5 10*3/uL (ref 4.0–10.5)

## 2012-02-08 LAB — HEPARIN LEVEL (UNFRACTIONATED): Heparin Unfractionated: 0.56 IU/mL (ref 0.30–0.70)

## 2012-02-08 MED ORDER — SODIUM CHLORIDE 0.9 % IV SOLN
INTRAVENOUS | Status: DC
  Administered 2012-02-08: 19:00:00 via INTRAVENOUS
  Administered 2012-02-09: 20 mL/h via INTRAVENOUS

## 2012-02-08 MED ORDER — PREDNISONE 20 MG PO TABS
40.0000 mg | ORAL_TABLET | Freq: Every day | ORAL | Status: DC
  Administered 2012-02-09 – 2012-02-11 (×3): 40 mg via ORAL
  Filled 2012-02-08 (×5): qty 2

## 2012-02-08 MED ORDER — BIOTENE DRY MOUTH MT LIQD
15.0000 mL | Freq: Two times a day (BID) | OROMUCOSAL | Status: DC
  Administered 2012-02-08 – 2012-02-16 (×15): 15 mL via OROMUCOSAL

## 2012-02-08 MED ORDER — DEXTROSE 5 % IV SOLN
2.0000 g | Freq: Two times a day (BID) | INTRAVENOUS | Status: DC
  Administered 2012-02-08 – 2012-02-17 (×18): 2 g via INTRAVENOUS
  Filled 2012-02-08 (×20): qty 2

## 2012-02-08 MED ORDER — DEXTROSE 5 % IV SOLN
40.0000 mg | Freq: Two times a day (BID) | INTRAVENOUS | Status: DC
  Administered 2012-02-08 (×2): 40 mg via INTRAVENOUS
  Filled 2012-02-08 (×3): qty 1

## 2012-02-08 NOTE — Progress Notes (Signed)
Subjective:  Patient denies any chest pain states get short of breath with minimal exertion. States overall feels much better denies any cough fever chills denies urinary complaints  Objective:  Vital Signs in the last 24 hours: Temp:  [97.3 F (36.3 C)-98 F (36.7 C)] 97.7 F (36.5 C) (11/15 1130) Pulse Rate:  [76-86] 79  (11/15 1130) Resp:  [18-20] 18  (11/15 1130) BP: (139-150)/(65-85) 143/65 mmHg (11/15 1130) SpO2:  [93 %-99 %] 96 % (11/15 1130) Weight:  [101.5 kg (223 lb 12.3 oz)] 101.5 kg (223 lb 12.3 oz) (11/15 0500)  Intake/Output from previous day: 11/14 0701 - 11/15 0700 In: 1284 [P.O.:200; I.V.:682; IV Piggyback:402] Out: 751 [Urine:751] Intake/Output from this shift: Total I/O In: 681 [P.O.:600; I.V.:31; IV Piggyback:50] Out: -   Physical Exam: Neck: no adenopathy, no carotid bruit, no JVD and supple, symmetrical, trachea midline Lungs: clear to auscultation bilaterally Heart: irregularly irregular rhythm, S1, S2 normal and Soft systolic murmur noted Abdomen: soft, non-tender; bowel sounds normal; no masses,  no organomegaly Extremities: No clubbing cyanosis 1+ edema noted improved  Lab Results:  Basename 02/08/12 0439 02/07/12 0405  WBC 5.0 4.4  HGB 9.2* 9.3*  PLT 135* 122*    Basename 02/08/12 1030 02/07/12 0405  NA 137 134*  K 4.3 4.5  CL 105 102  CO2 24 23  GLUCOSE 174* 134*  BUN 19 16  CREATININE 1.10 0.91   No results found for this basename: TROPONINI:2,CK,MB:2 in the last 72 hours Hepatic Function Panel No results found for this basename: PROT,ALBUMIN,AST,ALT,ALKPHOS,BILITOT,BILIDIR,IBILI in the last 72 hours No results found for this basename: CHOL in the last 72 hours No results found for this basename: PROTIME in the last 72 hours  Imaging: Imaging results have been reviewed and Dg Chest Port 1 View  02/07/2012  *RADIOLOGY REPORT*  Clinical Data: PICC line placement  PORTABLE CHEST - 1 VIEW  Comparison: Portable exam 1517 hours compared  to 02/03/2012  Findings: Right arm PICC line, tip projecting over SVC above cavoatrial junction. Enlargement of cardiac silhouette with pulmonary vascular congestion. No acute failure or consolidation. Minimal bibasilar atelectasis.  IMPRESSION: Tip of right arm PICC line projects over SVC. Enlargement of cardiac silhouette with pulmonary vascular congestion. Minimal bibasilar atelectasis.   Original Report Authenticated By: Ulyses Southward, M.D.     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  LOS: 5 days    Elliott Quade N 02/08/2012, 12:43 PM

## 2012-02-08 NOTE — Progress Notes (Signed)
ANTIBIOTIC CONSULT NOTE - Follow Up  Pharmacy Consult for:  Gentamicin Indication: Bio-prosthetic valve enterococcal endocarditis  Allergies  Allergen Reactions  . Codeine     REACTION: nausea  . Codeine Phosphate     REACTION: unspecified   Patient Measurements: Height: 5\' 6"  (167.6 cm) Weight: 220 lb 3.8 oz (99.9 kg) IBW/kg (Calculated) : 59.3  ABW/kg (Calculated): ABW = IBW + 0.4(actual weight - IBW) = 73.8kg  Vital Signs: Temp: 97.5 F (36.4 C) (11/14 2354) Temp src: Oral (11/14 2354) BP: 147/79 mmHg (11/14 2354) Pulse Rate: 86  (11/14 1927) Intake/Output from previous day: 11/14 0701 - 11/15 0700 In: 827 [I.V.:527; IV Piggyback:300] Out: 351 [Urine:351] Intake/Output from this shift:  Labs:  Basename 02/07/12 0405 02/06/12 0234 02/05/12 0244  WBC 4.4 3.9* 3.9*  HGB 9.3* 9.2* 9.1*  PLT 122* 123* 95*  LABCREA -- -- --  CREATININE 0.91 1.01 1.07   Estimated Creatinine Clearance: 65.6 ml/min (by C-G formula based on Cr of 0.91).  Medical History: Past Medical History  Diagnosis Date  . Coronary artery disease   . Atrial fibrillation     Now NSR  . GERD (gastroesophageal reflux disease)   . Hyperlipidemia   . 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 Anemia   . Angina   . Shortness of breath     with activity  . Hypertension   . Blood transfusion   . Pneumonia   . H/O hiatal hernia   . Lupus    Medications:  Scheduled:     . ampicillin (OMNIPEN) IV  2 g Intravenous Q4H  . [COMPLETED] aspirin  324 mg Oral Pre-Cath  . aspirin EC  81 mg Oral Daily  . atorvastatin  40 mg Oral q1800  . budesonide-formoterol  2 puff Inhalation BID  . [EXPIRED] diazepam  5 mg Oral On Call  . ferrous sulfate  325 mg Oral Q breakfast  . furosemide  40 mg Oral Daily  . gentamicin  80 mg Intravenous Q12H  . hydroxychloroquine  200 mg Oral BID  . metoprolol  25 mg Oral BID  . pantoprazole  40 mg Oral BID AC  . potassium chloride  20 mEq Oral Daily    . predniSONE  60 mg Oral Q breakfast  . rOPINIRole  0.5 mg Oral BID  . sodium chloride  10-40 mL Intracatheter Q12H  . sodium chloride  3 mL Intravenous Q12H  . sulfaSALAzine  1,000 mg Oral BID  . [DISCONTINUED] gentamicin  80 mg Intravenous Q8H   Assessment: 71 yo female with enterococcal endocarditis.  She has received IV Azithromycin, Ceftriaxone, Vancomycin and Zosyn this admission.  She has a bioprosthetic valve and now with blood cultures positive for enteroccoccus and vegetation on TEE.  She is receiving IV Ampicillin 2 gm every four hours which is an appropriate dose.  She has normal renal function with an estimated clearance of ~ 65 ml/min. This is ~50% improvement over her renal function on admission.  She is obese, therefore, adjusted weight for dosing aminoglycosides was used.  Gentamicin was started at 80mg  every 8 hours. After talking with Dr. Drue Second and the rest of the ID team it was felt that while the original dose is technically appropriate the patient is at a high risk of accumulation based on age and worse renal function on admission. Based on this we are decreasing the frequency and ordering levels to help determine proper dosing. These levels will be  difficult to interpret due to not being at steady state and the recent interval change.  11/10 azith>11/12 11/10 roceph>11/12 11/12 vanc > 11/13 11/12 zosyn > 11/13 11/13 Amp. > 11/13 Gent. >  11/10 urine- enteroccoccus (> 100, 000 colonies) 11/10 Blood - enteroccoccus x1 and GPC/clusters 11/13 Blood -   Gentamicin levels (11/14). 16:30 (trough) = 1.4 mcg/mL 18:30 (~ peak. 1.5 hr post-dose) = 11 mcg/mL 21:30 (random. 4.5 hr post-dose) = 12.4 mcg/mL  Initially gentamicin trough and peak were ordered. Trough was slightly above-goal, however approximate peak was significantly higher than anticipated. A random gentamicin level (4.5 hr post-dose) was ordered to help determine if the elevated peak was accurate. Although it is  unusual for gentamicin level to increase from 1.5 hr post-dose to 4.5 hr post-dose, these levels still indicate that dosing should likely be reduced based on indication.   Goal of Therapy:  Desired level gentamicin Trough <1 mcg/ml  Peak 3-4 mcg/ml  Plan:  1. Decrease gentamicin to 40mg  IV Q12H.  2. Follow-up renal function  3. Will order gentamicin levels (peak and trough) with 4th dose (18:00 Saturday dose) to evaluate dosing.   Lorre Munroe, PharmD 02/08/2012 2:25 AM

## 2012-02-08 NOTE — Progress Notes (Addendum)
[facility]    Date of Admission:  02/03/2012   Total days of antibiotics 6        Day 2 ampicillin and gent        2 days of  vanco and piptazo        2 Days of azithromycin and ceftriaxone (D/c'd 11/11)    ID: [name] is a 71 y.o. 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  Principal Problem:  *NSTEMI (non-ST elevated myocardial infarction)    Subjective: Afebrile.   24hr event: supratherapeutic on gent, thus dosing is being readjusted  Medications:     . ampicillin (OMNIPEN) IV  2 g Intravenous Q4H  . aspirin EC  81 mg Oral Daily  . atorvastatin  40 mg Oral q1800  . budesonide-formoterol  2 puff Inhalation BID  . [EXPIRED] diazepam  5 mg Oral On Call  . ferrous sulfate  325 mg Oral Q breakfast  . furosemide  40 mg Oral Daily  . gentamicin  40 mg Intravenous Q12H  . hydroxychloroquine  200 mg Oral BID  . metoprolol  25 mg Oral BID  . pantoprazole  40 mg Oral BID AC  . potassium chloride  20 mEq Oral Daily  . predniSONE  40 mg Oral Q breakfast  . rOPINIRole  0.5 mg Oral BID  . sodium chloride  10-40 mL Intracatheter Q12H  . sodium chloride  3 mL Intravenous Q12H  . sulfaSALAzine  1,000 mg Oral BID  . [DISCONTINUED] gentamicin  80 mg Intravenous Q12H  . [DISCONTINUED] predniSONE  60 mg Oral Q breakfast    Objective: Vital signs in last 24 hours: Temp:  [97.3 F (36.3 C)-98 F (36.7 C)] 97.7 F (36.5 C) (11/15 1130) Pulse Rate:  [76-86] 79  (11/15 1130) Resp:  [18-20] 18  (11/15 1130) BP: (139-150)/(65-85) 143/65 mmHg (11/15 1130) SpO2:  [93 %-99 %] 96 % (11/15 1130) Weight:  [223 lb 12.3 oz (101.5 kg)] 223 lb 12.3 oz (101.5 kg) (11/15 0500)  SpO2: [89 %-100 %] 95 % (11/13 1650)  Gen= a x o by 3, in NAD Neck: no adenopathy, no carotid bruit, no JVD and supple, symmetrical, trachea midline  Lungs: CTAB, no wheezing or rhonchi  Heart: irregularly irregular , S1, S2 normal and Soft  systolic murmur noted  Abdomen: soft, non-tender; bowel sounds normal; no masses, no organomegaly  Extremities: extremities normal, atraumatic, no cyanosis or edema. Right PICC line c/d/i   Lab Results  Basename 02/08/12 1030 02/08/12 0439 02/07/12 0405  WBC -- 5.0 4.4  HGB -- 9.2* 9.3*  HCT -- 28.7* 28.7*  NA 137 -- 134*  K 4.3 -- 4.5  CL 105 -- 102  CO2 24 -- 23  BUN 19 -- 16  CREATININE 1.10 -- 0.91  GLU -- -- --    Microbiology: 11/13 blood cx  X 2 NGTD 11/11 urine cx enterococcus amp-S 11/10 blood cx x 2: enterococcus: amp-S  Studies/Results: Dg Chest Port 1 View  02/07/2012  *RADIOLOGY REPORT*  Clinical Data: PICC line placement  PORTABLE CHEST - 1 VIEW  Comparison: Portable exam 1517 hours compared to 02/03/2012  Findings: Right arm PICC line, tip projecting over SVC above cavoatrial junction. Enlargement of cardiac silhouette with pulmonary vascular congestion. No acute failure or consolidation. Minimal bibasilar atelectasis.  IMPRESSION: Tip of right arm PICC line projects over SVC. Enlargement of cardiac silhouette with pulmonary vascular congestion. Minimal bibasilar atelectasis.  Original Report Authenticated By: Ulyses Southward, M.D.     Assessment/Plan: 71 yo woman with enterococcal  native mitral valve endocarditis with possible tricuspid involvement, since she has new TR regurgitant, but no vegetation was seen on echo. nor on prosthetic aortic valve.  - currently on ampicillin 2gm IV q4hr plus gentamicin. Concern that it will be difficult to maintain therapeutic levels of gentamicin. Will switch her regimen to:   ampicillin 2gm IV Q 4hr and ceftriaxone 2gm IV Q12hr to minimize nephrotoxocity.   - she had picc line placed on 11/14, however, we did not document clearance of her bacteremia. Await for results from her blood cultures from 11/13 to see that she has cleared her bacteremia.   - we will treat for at least 6 wks of IV antibiotics, last day of antibiotics Dec  24th.   and have her follow up in ID clinic in 4-6 wks.  - will need to ask home health if they can provide pump for continuous infusion or programmed infusion for ampicillin  Dr. Enedina Finner available for questions over the weekend.  Drue Second [facility] Cell: [phone] Pager: [phone]  02/08/2012, 2:47 PM

## 2012-02-08 NOTE — Progress Notes (Signed)
ANTICOAGULATION CONSULT NOTE - Follow Up Consult  Pharmacy Consult for heparin Indication: atrial fibrillation and NSTEMI  Heparin dosing wt=85.5kg  Labs:  Basename 02/08/12 0439 02/07/12 1142 02/07/12 0405 02/06/12 0234 02/05/12 2136 02/05/12 1508 02/05/12 0925  HGB 9.2* -- 9.3* -- -- -- --  HCT 28.7* -- 28.7* 28.5* -- -- --  PLT 135* -- 122* 123* -- -- --  APTT -- -- -- -- -- -- 73*  LABPROT -- -- 17.1* -- -- -- --  INR -- -- 1.43 -- -- -- --  HEPARINUNFRC 0.56 0.56 0.29* -- -- -- --  CREATININE -- -- 0.91 1.01 -- -- --  CKTOTAL -- -- -- -- 31 39 40  CKMB -- -- -- -- 3.5 3.6 3.7  TROPONINI -- -- -- -- -- -- --    Assessment: 71yo female  on heparin for NSTEMI/afib with therapeutic heparin level on 2100 units/hr. Cath reschedule for next week. Noted with prior AVR/ CABG and now with with enterococcal mitral valve endocarditis -not a good surgical candidate per CVTS.   Goal of Therapy:  Heparin level 0.3-0.7 units/ml   Plan:  1. Continue IV heparin at current rate. 2. Daily heparin level and CBC. 3. F/U plans for cath  Harland German, Pharm D 02/08/2012 8:22 AM

## 2012-02-09 LAB — CBC
HCT: 27.7 % — ABNORMAL LOW (ref 36.0–46.0)
MCV: 95.8 fL (ref 78.0–100.0)
Platelets: 129 10*3/uL — ABNORMAL LOW (ref 150–400)
RBC: 2.89 MIL/uL — ABNORMAL LOW (ref 3.87–5.11)
RDW: 14.6 % (ref 11.5–15.5)
WBC: 5.2 10*3/uL (ref 4.0–10.5)

## 2012-02-09 LAB — BASIC METABOLIC PANEL
BUN: 22 mg/dL (ref 6–23)
Chloride: 103 mEq/L (ref 96–112)
GFR calc Af Amer: 56 mL/min — ABNORMAL LOW (ref >90)
GFR calc non Af Amer: 49 mL/min — ABNORMAL LOW (ref >90)
Glucose, Bld: 134 mg/dL — ABNORMAL HIGH (ref 70–99)
Potassium: 4.3 mEq/L (ref 3.5–5.1)
Sodium: 137 mEq/L (ref 135–145)

## 2012-02-09 MED ORDER — FUROSEMIDE 10 MG/ML IJ SOLN
40.0000 mg | Freq: Every day | INTRAMUSCULAR | Status: DC
  Administered 2012-02-09 – 2012-02-10 (×2): 40 mg via INTRAVENOUS
  Filled 2012-02-09 (×2): qty 4

## 2012-02-09 NOTE — Progress Notes (Signed)
Subjective:  Appreciate ID consult and CVTS  consult and help Patient denies any chest pain shortness of breath fever chills Complaints of leg swelling and occasional epistaxis  Objective:  Vital Signs in the last 24 hours: Temp:  [97.4 F (36.3 C)-98.5 F (36.9 C)] 97.4 F (36.3 C) (11/16 0826) Pulse Rate:  [79-90] 90  (11/15 1600) Resp:  [18-19] 18  (11/15 1930) BP: (143-166)/(65-95) 166/95 mmHg (11/16 0826) SpO2:  [96 %-100 %] 100 % (11/16 1030)  Intake/Output from previous day: 11/15 0701 - 11/16 0700 In: 2441.5 [P.O.:1200; I.V.:840.5; IV Piggyback:401] Out: -  Intake/Output from this shift: Total I/O In: 166 [I.V.:116; IV Piggyback:50] Out: 150 [Urine:150]  Physical Exam: Neck: no adenopathy, no carotid bruit, no JVD and supple, symmetrical, trachea midline Lungs: clear to auscultation bilaterally Heart: irregularly irregular rhythm, S1, S2 normal and Soft systolic murmur noted Abdomen: soft, non-tender; bowel sounds normal; no masses,  no organomegaly Extremities: No clubbing cyanosis 2+ edema noted  Lab Results:  Basename 02/09/12 0439 02/08/12 0439  WBC 5.2 5.0  HGB 9.0* 9.2*  PLT 129* 135*    Basename 02/09/12 0439 02/08/12 1030  NA 137 137  K 4.3 4.3  CL 103 105  CO2 25 24  GLUCOSE 134* 174*  BUN 22 19  CREATININE 1.10 1.10   No results found for this basename: TROPONINI:2,CK,MB:2 in the last 72 hours Hepatic Function Panel No results found for this basename: PROT,ALBUMIN,AST,ALT,ALKPHOS,BILITOT,BILIDIR,IBILI in the last 72 hours No results found for this basename: CHOL in the last 72 hours No results found for this basename: PROTIME in the last 72 hours  Imaging: Imaging results have been reviewed and Dg Chest Port 1 View  02/07/2012  *RADIOLOGY REPORT*  Clinical Data: PICC line placement  PORTABLE CHEST - 1 VIEW  Comparison: Portable exam 1517 hours compared to 02/03/2012  Findings: Right arm PICC line, tip projecting over SVC above cavoatrial  junction. Enlargement of cardiac silhouette with pulmonary vascular congestion. No acute failure or consolidation. Minimal bibasilar atelectasis.  IMPRESSION: Tip of right arm PICC line projects over SVC. Enlargement of cardiac silhouette with pulmonary vascular congestion. Minimal bibasilar atelectasis.   Original Report Authenticated By: Ulyses Southward, M.D.     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

## 2012-02-09 NOTE — Progress Notes (Signed)
ANTICOAGULATION CONSULT NOTE - Follow Up Consult  Pharmacy Consult for heparin Indication: atrial fibrillation and NSTEMI  Heparin dosing wt=85.5kg  Labs:  Basename 02/09/12 0439 02/08/12 1030 02/08/12 0439 02/07/12 1142 02/07/12 0405  HGB 9.0* -- 9.2* -- --  HCT 27.7* -- 28.7* -- 28.7*  PLT 129* -- 135* -- 122*  APTT -- -- -- -- --  LABPROT -- -- -- -- 17.1*  INR -- -- -- -- 1.43  HEPARINUNFRC 0.54 -- 0.56 0.56 --  CREATININE 1.10 1.10 -- -- 0.91  CKTOTAL -- -- -- -- --  CKMB -- -- -- -- --  TROPONINI -- -- -- -- --    Assessment: 71yo female  on heparin for NSTEMI/afib with therapeutic heparin level on 2100 units/hr. Cath reschedule for next week. Noted with prior AVR/ CABG and now with with enterococcal mitral valve endocarditis -not a good surgical candidate per CVTS.   Goal of Therapy:  Heparin level 0.3-0.7 units/ml   Plan:  1. Continue IV heparin at current rate. 2. Daily heparin level and CBC. 3. F/U plans for cath  Harland German, Pharm D 02/09/2012 11:46 AM

## 2012-02-10 LAB — CBC
HCT: 28.5 % — ABNORMAL LOW (ref 36.0–46.0)
MCV: 96.9 fL (ref 78.0–100.0)
Platelets: 123 10*3/uL — ABNORMAL LOW (ref 150–400)
RBC: 2.94 MIL/uL — ABNORMAL LOW (ref 3.87–5.11)
RDW: 14.8 % (ref 11.5–15.5)
WBC: 6.1 10*3/uL (ref 4.0–10.5)

## 2012-02-10 MED ORDER — POTASSIUM CHLORIDE CRYS ER 20 MEQ PO TBCR
20.0000 meq | EXTENDED_RELEASE_TABLET | Freq: Two times a day (BID) | ORAL | Status: DC
  Administered 2012-02-10 – 2012-02-17 (×12): 20 meq via ORAL
  Filled 2012-02-10 (×15): qty 1

## 2012-02-10 MED ORDER — LOSARTAN POTASSIUM 50 MG PO TABS
50.0000 mg | ORAL_TABLET | Freq: Every day | ORAL | Status: DC
  Administered 2012-02-10 – 2012-02-11 (×2): 50 mg via ORAL
  Filled 2012-02-10 (×3): qty 1

## 2012-02-10 MED ORDER — FUROSEMIDE 10 MG/ML IJ SOLN
40.0000 mg | Freq: Two times a day (BID) | INTRAMUSCULAR | Status: DC
  Administered 2012-02-10 – 2012-02-11 (×3): 40 mg via INTRAVENOUS
  Filled 2012-02-10 (×2): qty 4

## 2012-02-10 NOTE — Progress Notes (Signed)
Name: [name] MRN: [id number] DOB: Dec 05, 1940    LOS: 7  Referring Provider:  Dr. Sharyn Lull Reason for Referral:  hypotension  PULMONARY / CRITICAL CARE [name]    Brief patient description:  71 y/o woman with mixed connective tissue disease, admitted on 11/11 for NSTEMI, c/b underlying  lung disease and a-fib  Events Since Admission: Started on ACS pathway  Current Status: guarded  Vital Signs: Temp:  [97 F (36.1 C)-98.1 F (36.7 C)] 97.5 F (36.4 C) (11/17 0742) Pulse Rate:  [81] 81  (11/17 0742) Resp:  [16-20] 18  (11/17 0742) BP: (145-169)/(76-101) 169/101 mmHg (11/17 0742) SpO2:  [97 %-100 %] 97 % (11/17 0742) Weight:  [102 kg (224 lb 13.9 oz)] 102 kg (224 lb 13.9 oz) (11/17 0500)  Physical Examination: General:  Elderly woman laying in bed in NAD Neuro:  CNII-XII intact, A&Ox3 HEENT:  PERRL, EOMI, OP clear Neck:  Supple, unable to assess JVD due to body habitus Cardiovascular:  Irregularly irregular, normal rate, no mrg Lungs:  CTAB, no wrr Abdomen:  Soft, NTND, +BS Musculoskeletal:  No obvious deformities, no edema, no clubbing Skin:  No rash  Principal Problem:  *NSTEMI (non-ST elevated myocardial infarction)   ASSESSMENT AND PLAN  PULMONARY No results found for this basename: PHART:5,PCO2:5,PCO2ART:5,PO2ART:5,HCO3:5,O2SAT:5 in the last 168 hours Ventilator Settings:   CXR:  CT with ?round atelectesis vs, fluid in fissure on right, mosaicism  A:  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 - Symbicort 160/4.5 at home dose. - Other immune modulators that are not being taken now can be restarted as outpatient. - Continue home medications.  Summary statement:  71 year old with mixed connective tissue disease who is now much improved from a pulmonary standpoint.  Patient was placed on high dose steroids  while in the hospital.  CXR and symptoms are clearing quite nicely.  She is usually on prednisone 20 mg PO daily, now down to 40 mg PO daily, would recommend a taper by 5 mg per week over the next 4 weeks until she reaches her usual dose of 20 mg PO daily with close f/u with [name] (Dr. Shan Levans) as outpatient upon discharge.  Abx per the ID service.  Agree with other immune modulators as ordered.  Continue symbicort at current dose (that is the patient's home dose).  [name] will sign off, please call back if needed.  Alyson Reedy, M.D. [name]. Pager: [phone]. After hours pager: [phone].

## 2012-02-10 NOTE — Progress Notes (Signed)
ANTICOAGULATION CONSULT NOTE - Follow Up Consult  Pharmacy Consult for heparin Indication: atrial fibrillation and NSTEMI  Heparin dosing wt=85.5kg  Labs:  Basename 02/10/12 0520 02/09/12 0439 02/08/12 1030 02/08/12 0439  HGB 9.3* 9.0* -- --  HCT 28.5* 27.7* -- 28.7*  PLT 123* 129* -- 135*  APTT -- -- -- --  LABPROT -- -- -- --  INR -- -- -- --  HEPARINUNFRC 0.52 0.54 -- 0.56  CREATININE -- 1.10 1.10 --  CKTOTAL -- -- -- --  CKMB -- -- -- --  TROPONINI -- -- -- --    Assessment: 71yo female  on heparin for NSTEMI/afib with therapeutic heparin level on 2100 units/hr and at goal (HL=0.52). Cath reschedule for next week. Noted with prior AVR/ CABG and now with with enterococcal mitral valve endocarditis -not a good surgical candidate per CVTS.   Goal of Therapy:  Heparin level 0.3-0.7 units/ml   Plan:  1. Continue IV heparin at current rate. 2. Daily heparin level and CBC. 3. F/U plans for cath  Harland German, Pharm D 02/10/2012 1:00 PM

## 2012-02-10 NOTE — Progress Notes (Signed)
Subjective:  Patient denies any chest pain or shortness of breath. No further epistaxis. Complains of significant leg swelling Remains in A. fib with controlled ventricular response with occasional PVCs no significant arrhythmias Objective:  Vital Signs in the last 24 hours: Temp:  [97 F (36.1 C)-98.1 F (36.7 C)] 97.5 F (36.4 C) (11/17 0742) Pulse Rate:  [81] 81  (11/17 0742) Resp:  [16-20] 18  (11/17 0742) BP: (145-169)/(76-101) 169/101 mmHg (11/17 0742) SpO2:  [97 %-100 %] 97 % (11/17 0742) Weight:  [102 kg (224 lb 13.9 oz)] 102 kg (224 lb 13.9 oz) (11/17 0500)  Intake/Output from previous day: 11/16 0701 - 11/17 0700 In: 1862.5 [P.O.:500; I.V.:958.5; IV Piggyback:404] Out: 800 [Urine:800] Intake/Output from this shift: Total I/O In: 41 [I.V.:41] Out: -   Physical Exam: Neck: no adenopathy, no carotid bruit, no JVD and supple, symmetrical, trachea midline Lungs: clear to auscultation bilaterally Heart: irregularly irregular rhythm, S1, S2 normal and Soft systolic murmur noted Abdomen: soft, non-tender; bowel sounds normal; no masses,  no organomegaly Extremities: No clubbing cyanosis 3+ edema  Lab Results:  Basename 02/10/12 0520 02/09/12 0439  WBC 6.1 5.2  HGB 9.3* 9.0*  PLT 123* 129*    Basename 02/09/12 0439 02/08/12 1030  NA 137 137  K 4.3 4.3  CL 103 105  CO2 25 24  GLUCOSE 134* 174*  BUN 22 19  CREATININE 1.10 1.10   No results found for this basename: TROPONINI:2,CK,MB:2 in the last 72 hours Hepatic Function Panel No results found for this basename: PROT,ALBUMIN,AST,ALT,ALKPHOS,BILITOT,BILIDIR,IBILI in the last 72 hours No results found for this basename: CHOL in the last 72 hours No results found for this basename: PROTIME in the last 72 hours  Imaging: Imaging results have been reviewed and No results found.  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

## 2012-02-10 NOTE — Evaluation (Signed)
Physical Therapy Evaluation Patient Details Name: [name] MRN: [id number] DOB: 1940/10/13 Today's Date: 02/10/2012 Time: 4098-1191 PT Time Calculation (min): 30 min  PT Assessment / Plan / Recommendation Clinical Impression  Patient is a 71 yo female admitted with NSTEMI.  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  Patient moves fairly well.  However, DOE and general weakness impact functional mobility.  Patient will benefit from acute PT to maximize independence prior to discharge with daughter.    PT Assessment  Patient needs continued PT services    Follow Up Recommendations  Home health PT;Supervision/Assistance - 24 hour    Does the patient have the potential to tolerate intense rehabilitation      Barriers to Discharge None      Equipment Recommendations  None recommended by PT    Recommendations for Other Services     Frequency Min 3X/week    Precautions / Restrictions Precautions Precautions: None Restrictions Weight Bearing Restrictions: No   Pertinent Vitals/Pain Dyspnea 4/4 with gait - O2 sats remained at 97%      Mobility  Bed Mobility Bed Mobility: Not assessed Transfers Transfers: Sit to Stand;Stand to Sit Sit to Stand: 5: Supervision;With upper extremity assist;From bed Stand to Sit: 5: Supervision;With upper extremity assist;To bed Details for Transfer Assistance: No cues needed.  Supervision for safety only. Ambulation/Gait Ambulation/Gait Assistance: 4: Min guard Ambulation Distance (Feet): 22 Feet Assistive device: None Ambulation/Gait Assistance Details: Verbal cues to move at slow pace to minimize dyspnea.  Balance good during gait.  Assist for safety.  Patient with dyspnea 4/4 with gait (O2 sat remained at 97%, and HR to 88). Gait Pattern: Step-through pattern;Decreased step length - right;Decreased step length - left;Decreased stride length;Shuffle Gait velocity: Slow gait speed to minimize dyspnea           PT Diagnosis:  Difficulty walking;Abnormality of gait;Generalized weakness  PT Problem List: Decreased strength;Decreased activity tolerance;Decreased mobility PT Treatment Interventions: DME instruction;Gait training;Stair training;Functional mobility training;Patient/family education   PT Goals Acute Rehab PT Goals PT Goal Formulation: With patient Time For Goal Achievement: 02/17/12 Potential to Achieve Goals: Good Pt will go Supine/Side to Sit: Independently;with HOB 0 degrees PT Goal: Supine/Side to Sit - Progress: Goal set today Pt will go Sit to Supine/Side: Independently;with HOB 0 degrees PT Goal: Sit to Supine/Side - Progress: Goal set today Pt will go Sit to Stand: Independently;with upper extremity assist PT Goal: Sit to Stand - Progress: Goal set today Pt will Ambulate: 51 - 150 feet;with supervision;with least restrictive assistive device PT Goal: Ambulate - Progress: Goal set today Pt will Go Up / Down Stairs: 3-5 stairs;with min assist;with rail(s) PT Goal: Up/Down Stairs - Progress: Goal set today  Visit Information  Last PT Received On: 02/10/12 Assistance Needed: +1    Subjective Data  Subjective: "I just get so short of breath" Patient Stated Goal: To eventually return to her home.   Prior Functioning  Home Living Lives With: Daughter (at discharge.  PTA, lived alone) Available Help at Discharge: Family;Available 24 hours/day Type of Home: House Home Access: Stairs to enter [employer] of Steps: 5 Entrance Stairs-Rails: Right;Left Home Layout: Two level;Able to live on main level with bedroom/bathroom Bathroom Shower/Tub: Engineer, manufacturing systems: Standard Home Adaptive Equipment: Walker - rolling;Shower chair with back;Wheelchair - manual (Home O2) Additional Comments: Patient plans to stay at daughter's home through December as she recouperates.  Then hopes to go back to her apartment. Prior Function Level of Independence: Independent  Able to Take  Stairs?: Yes Driving: Yes Vocation: Retired Musician: No difficulties    Cognition  Overall Cognitive Status: Appears within functional limits for tasks assessed/performed Arousal/Alertness: Awake/alert Orientation Level: Oriented X4 / Intact Behavior During Session: WFL for tasks performed    Extremity/Trunk Assessment Right Upper Extremity Assessment RUE ROM/Strength/Tone: WFL for tasks assessed RUE Sensation: WFL - Light Touch Left Upper Extremity Assessment LUE ROM/Strength/Tone: WFL for tasks assessed LUE Sensation: WFL - Light Touch Right Lower Extremity Assessment RLE ROM/Strength/Tone: WFL for tasks assessed (General weakness throughout) RLE Sensation: WFL - Light Touch Left Lower Extremity Assessment LLE ROM/Strength/Tone: WFL for tasks assessed LLE Sensation: WFL - Light Touch Trunk Assessment Trunk Assessment: Normal   Balance Balance Balance Assessed: Yes Static Sitting Balance Static Sitting - Balance Support: No upper extremity supported;Feet supported Static Sitting - Level of Assistance: 7: Independent Static Sitting - Comment/# of Minutes: Patient able to sit on EOB independently with good balance and upright posture. Static Standing Balance Static Standing - Balance Support: Right upper extremity supported Static Standing - Level of Assistance: 5: Stand by assistance Static Standing - Comment/# of Minutes: Patient able to maintain upright standing posture with good balance x 3 minutes.  No c/o dizziness or lightheadedness.  End of Session PT - End of Session Equipment Utilized During Treatment: Gait belt;Oxygen Activity Tolerance: Patient limited by fatigue;Treatment limited secondary to medical complications (Comment) (Dyspnea 4/4 - ended gait) Patient left: in bed;with call bell/phone within reach (sitting on EOB) Nurse Communication: Mobility status  GP     Vena Austria 02/10/2012, 4:32 PM Durenda Hurt. Renaldo Fiddler, [facility] Acute Rehab  Services Pager [phone]

## 2012-02-11 DIAGNOSIS — I38 Endocarditis, valve unspecified: Secondary | ICD-10-CM

## 2012-02-11 LAB — CBC
Hemoglobin: 8.4 g/dL — ABNORMAL LOW (ref 12.0–15.0)
Platelets: 117 10*3/uL — ABNORMAL LOW (ref 150–400)
RBC: 2.71 MIL/uL — ABNORMAL LOW (ref 3.87–5.11)
WBC: 4.7 10*3/uL (ref 4.0–10.5)

## 2012-02-11 LAB — BASIC METABOLIC PANEL
CO2: 25 mEq/L (ref 19–32)
Chloride: 103 mEq/L (ref 96–112)
GFR calc Af Amer: 54 mL/min — ABNORMAL LOW (ref >90)
Potassium: 4 mEq/L (ref 3.5–5.1)

## 2012-02-11 LAB — HEPARIN LEVEL (UNFRACTIONATED): Heparin Unfractionated: 0.41 IU/mL (ref 0.30–0.70)

## 2012-02-11 MED ORDER — WARFARIN - PHARMACIST DOSING INPATIENT: Freq: Every day | Status: DC

## 2012-02-11 MED ORDER — WARFARIN SODIUM 5 MG PO TABS
5.0000 mg | ORAL_TABLET | Freq: Once | ORAL | Status: AC
  Administered 2012-02-11: 5 mg via ORAL
  Filled 2012-02-11: qty 1

## 2012-02-11 MED ORDER — ROPINIROLE HCL 1 MG PO TABS
1.0000 mg | ORAL_TABLET | Freq: Every day | ORAL | Status: DC
  Administered 2012-02-12 – 2012-02-16 (×5): 1 mg via ORAL
  Filled 2012-02-11 (×6): qty 1

## 2012-02-11 MED ORDER — PATIENT'S GUIDE TO USING COUMADIN BOOK
Freq: Once | Status: AC
  Administered 2012-02-11: 18:00:00
  Filled 2012-02-11: qty 1

## 2012-02-11 MED ORDER — ASPIRIN 81 MG PO CHEW
CHEWABLE_TABLET | ORAL | Status: AC
  Filled 2012-02-11: qty 1

## 2012-02-11 MED ORDER — WARFARIN VIDEO: Freq: Once | Status: DC

## 2012-02-11 NOTE — Progress Notes (Signed)
Physical Therapy Treatment Patient Details Name: [name] MRN: [id number] DOB: Sep 26, 1940 Today's Date: 02/11/2012 Time: 4098-1191 PT Time Calculation (min): 32 min  PT Assessment / Plan / Recommendation Comments on Treatment Session  Good progress today with short walks and seated rest break in between with a focus on controlling her breath with pursed lip breathing and relaxation. Pt also instructed in posture and how it affects her breathing and ability to bring in more air. Pt able to demonstrate return proper breathing technique by the end of our session. Still becomes significantly dyspneic with ambulation further than 20 ft at a time.     Follow Up Recommendations  Home health PT;Supervision for mobility/OOB     Does the patient have the potential to tolerate intense rehabilitation     Barriers to Discharge        Equipment Recommendations  None recommended by PT    Recommendations for Other Services    Frequency     Plan Discharge plan remains appropriate;Frequency remains appropriate    Precautions / Restrictions Precautions Precautions: None Restrictions Weight Bearing Restrictions: No   Pertinent Vitals/Pain DOE with any ambulation 3-4/4    Mobility  Bed Mobility Bed Mobility: Not assessed Details for Bed Mobility Assistance: pt sitting EOB Transfers Transfers: Sit to Stand;Stand to Sit Sit to Stand: 6: Modified independent (Device/Increase time);With upper extremity assist;From bed Stand to Sit: 6: Modified independent (Device/Increase time);With upper extremity assist;To bed Ambulation/Gait Ambulation/Gait Assistance: 5: Supervision Ambulation Distance (Feet):  (80) Assistive device: None Ambulation/Gait Assistance Details: cues for controlled breath with no talking while walking, cues for self monitoring and relaxation when she gets SOB (dyspnea 3-4/4 towards the end of ambulation) General Gait Details: Ambulated 20 ft x3 with verbal cues throughout  for controlled breath and relaxation especially as she becomes more dysnpneic     Exercises Other Exercises Other Exercises: Sitting EOB pt instructed in pursed lip breathing with focus on relaxation in prep for ambulation, facilitation for tall posture for 5 minutes sitting EOB in between each walk     PT Goals Acute Rehab PT Goals PT Goal: Sit to Stand - Progress: Progressing toward goal PT Goal: Ambulate - Progress: Progressing toward goal  Visit Information  Last PT Received On: 02/11/12 Assistance Needed: +1    Subjective Data  Subjective: Im just short of breath doing everything. It makes me panic.    Cognition  Overall Cognitive Status: Appears within functional limits for tasks assessed/performed Arousal/Alertness: Awake/alert Orientation Level: Appears intact for tasks assessed Behavior During Session: [facility] for tasks performed    Balance     End of Session PT - End of Session Equipment Utilized During Treatment: Gait belt Activity Tolerance: Patient tolerated treatment well Patient left: in bed;with call bell/phone within reach Nurse Communication: Mobility status   GP     [facility] HELEN 02/11/2012, 10:21 AM

## 2012-02-11 NOTE — Progress Notes (Addendum)
ANTICOAGULATION CONSULT NOTE - Follow Up Consult  Pharmacy Consult for heparin Indication: atrial fibrillation and NSTEMI  Heparin dosing wt=85.5kg  Labs:  Basename 02/11/12 0414 02/10/12 0520 02/09/12 0439 02/08/12 1030  HGB 8.4* 9.3* -- --  HCT 26.3* 28.5* 27.7* --  PLT 117* 123* 129* --  APTT -- -- -- --  LABPROT -- -- -- --  INR -- -- -- --  HEPARINUNFRC 0.41 0.52 0.54 --  CREATININE 1.14* -- 1.10 1.10  CKTOTAL -- -- -- --  CKMB -- -- -- --  TROPONINI -- -- -- --    Assessment: 71yo female  on heparin for NSTEMI/afib with therapeutic heparin level on 2100 units/hr and at goal (HL=0.41). Noted with prior AVR/ CABG, now enterococcal mitral valve endocarditis, and valvular afib. Noted patient was on apixiban PTA indicated for non-valvular afib with last dose taken on 11/9. She does not want to pursue cardiac cath. Patient does have a history of GI bleed. Spoke with Dr. Sharyn Lull who would like a Heparin goal of 0.3-0.5 and INR goal of 2 - 2.5. Patient does not remember her coumadin dose in the past.    Goal of Therapy:  Heparin goal 0.3-0.5 INR goal 2.0-2.5   Plan:  1. Continue IV heparin at current rate 2. Daily heparin level and CBC. 3. Coumadin 5mg  po x 1 dose at 1800 4. Coumadin book and video 5. Daily INR   Thank you,  Brett Fairy, PharmD 02/11/2012 8:33 AM

## 2012-02-11 NOTE — Evaluation (Signed)
Occupational Therapy Evaluation Patient Details Name: NEIDY GUERRIERI MRN: [id number] DOB: [birth year] Today's Date: 02/11/2012 Time: 1478-2956 OT Time Calculation (min): 38 min  OT Assessment / Plan / Recommendation Clinical Impression  Patient is a 71 yo female admitted with NSTEMI.  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 Pt presents to OT with decreased I with ADL activity due to decreased endurance and SOB. Pt will benefit from skilled OT to increase I with ADL actiivity and return to PLOF    OT Assessment  Patient needs continued OT Services    Follow Up Recommendations  Home health OT       Equipment Recommendations  None recommended by OT       Frequency  Min 2X/week    Precautions / Restrictions Precautions Precautions: None Precaution Comments: Pt does become very SOB with minimal activity Restrictions Weight Bearing Restrictions: No       ADL  Grooming: Simulated;Minimal assistance Where Assessed - Grooming: Unsupported standing Upper Body Bathing: Simulated;Minimal assistance Where Assessed - Upper Body Bathing: Unsupported sitting Lower Body Bathing: Performed;Moderate assistance Where Assessed - Lower Body Bathing: Unsupported sit to stand Upper Body Dressing: Simulated;Minimal assistance Where Assessed - Upper Body Dressing: Unsupported sitting Lower Body Dressing: Simulated;Moderate assistance Where Assessed - Lower Body Dressing: Unsupported sit to stand Toilet Transfer: Performed Toilet Transfer Method: Sit to stand;Stand pivot Toilet Transfer Equipment: Bedside commode Toileting - Clothing Manipulation and Hygiene: Minimal assistance Where Assessed - Engineer, mining and Hygiene: Standing Tub/Shower Transfer: Minimal assistance Transfers/Ambulation Related to ADLs: Verbal cues needed to take deep breathes and use deep breathing technique ADL Comments: Pt educated on energy conservation with ADL activity . Handout provided    OT  Diagnosis: Generalized weakness  OT Problem List: Decreased strength;Cardiopulmonary status limiting activity;Decreased activity tolerance OT Treatment Interventions: Self-care/ADL training;Energy conservation;DME and/or AE instruction;Patient/family education   OT Goals Acute Rehab OT Goals OT Goal Formulation: With patient Time For Goal Achievement: 02/25/12 Potential to Achieve Goals: Good ADL Goals Pt Will Perform Grooming: with modified independence;Standing at sink ADL Goal: Grooming - Progress: Goal set today Pt Will Perform Upper Body Dressing: with modified independence;Sit to stand from chair ADL Goal: Upper Body Dressing - Progress: Goal set today Pt Will Perform Lower Body Dressing: Sit to stand from chair;with modified independence ADL Goal: Lower Body Dressing - Progress: Goal set today Miscellaneous OT Goals Miscellaneous OT Goal #1: Pt will utilize energy conservation principles with ADL activity with handout at mod I level OT Goal: Miscellaneous Goal #1 - Progress: Goal set today  Visit Information  Last OT Received On: 02/11/12 Assistance Needed: +1       Prior Functioning     Home Living Lives With: Daughter (at discharge.  PTA, lived alone) Available Help at Discharge: Family;Available 24 hours/day Type of Home: House Home Access: Stairs to enter [employer] of Steps: 5 Entrance Stairs-Rails: Right;Left Home Layout: Two level;Able to live on main level with bedroom/bathroom Bathroom Shower/Tub: Engineer, manufacturing systems: Standard Home Adaptive Equipment: Walker - rolling;Shower chair with back;Wheelchair - manual (Home O2) Additional Comments: Patient plans to stay at daughter's home through December as she recouperates.  Then hopes to go back to her apartment. Prior Function Level of Independence: Independent Able to Take Stairs?: Yes Driving: Yes Vocation: Retired Musician: No difficulties Dominant Hand: Right              Cognition  Overall Cognitive Status: Appears within functional limits for tasks assessed/performed Arousal/Alertness: Awake/alert  Orientation Level: Appears intact for tasks assessed Behavior During Session: [facility] for tasks performed    Extremity/Trunk Assessment Right Upper Extremity Assessment RUE ROM/Strength/Tone: [facility] for tasks assessed RUE Sensation: WFL - Light Touch Left Upper Extremity Assessment LUE ROM/Strength/Tone: WFL for tasks assessed LUE Sensation: WFL - Light Touch Right Lower Extremity Assessment RLE ROM/Strength/Tone: WFL for tasks assessed (General weakness throughout) RLE Sensation: WFL - Light Touch Left Lower Extremity Assessment LLE ROM/Strength/Tone: WFL for tasks assessed LLE Sensation: WFL - Light Touch Trunk Assessment Trunk Assessment: Normal     Mobility Bed Mobility Bed Mobility: Not assessed Details for Bed Mobility Assistance: pt sitting EOB Transfers Transfers: Sit to Stand;Stand to Sit Sit to Stand: 4: Min assist;From chair/3-in-1;From toilet Stand to Sit: 4: Min guard;To toilet;To chair/3-in-1 Details for Transfer Assistance: Pt with multiple lines to manage. Verbal cues needed to take deep breath        Exercise Other Exercises Other Exercises: Sitting EOB pt instructed in pursed lip breathing with focus on relaxation in prep for ambulation, facilitation for tall posture for 5 minutes sitting EOB in between each walk      End of Session OT - End of Session Activity Tolerance: Patient tolerated treatment well;Other (comment) (although someowhat limited by SOB) Pt left in chair with call bell with in reach and visitor present  GO     Kristin Lamagna, [place]ung 02/11/2012, 11:51 AM

## 2012-02-11 NOTE — Progress Notes (Signed)
Subjective:  Patient denies any chest pain or shortness of breath. Discussed at length regarding cardiac catheterization versus medical management and agrees for medical management. Patient had extensive GI workup in the past which was negative. Hemoglobin remains low. Patient eager to go home  Objective:  Vital Signs in the last 24 hours: Temp:  [97.5 F (36.4 C)-98 F (36.7 C)] 97.7 F (36.5 C) (11/18 1130) Pulse Rate:  [80-94] 83  (11/18 1130) Resp:  [18-20] 20  (11/18 1130) BP: (136-170)/(67-98) 146/76 mmHg (11/18 1130) SpO2:  [96 %-100 %] 98 % (11/18 1130)  Intake/Output from previous day: 11/17 0701 - 11/18 0700 In: 1522 [I.V.:1314; IV Piggyback:208] Out: 1250 [Urine:1250] Intake/Output from this shift: Total I/O In: 156 [I.V.:102; IV Piggyback:54] Out: -   Physical Exam: Neck: no adenopathy, no carotid bruit, no JVD and supple, symmetrical, trachea midline Lungs: clear to auscultation bilaterally Heart: irregularly irregular rhythm, S1, S2 normal and Soft systolic murmur noted Abdomen: soft, non-tender; bowel sounds normal; no masses,  no organomegaly Extremities: No clubbing cyanosis 3+ edema noted  Lab Results:  Basename 02/11/12 0414 02/10/12 0520  WBC 4.7 6.1  HGB 8.4* 9.3*  PLT 117* 123*    Basename 02/11/12 0414 02/09/12 0439  NA 138 137  K 4.0 4.3  CL 103 103  CO2 25 25  GLUCOSE 150* 134*  BUN 21 22  CREATININE 1.14* 1.10   No results found for this basename: TROPONINI:2,CK,MB:2 in the last 72 hours Hepatic Function Panel No results found for this basename: PROT,ALBUMIN,AST,ALT,ALKPHOS,BILITOT,BILIDIR,IBILI in the last 72 hours No results found for this basename: CHOL in the last 72 hours No results found for this basename: PROTIME in the last 72 hours  Imaging: Imaging results have been reviewed and No results found.  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

## 2012-02-11 NOTE — Progress Notes (Signed)
[facility]    Date of Admission:  02/03/2012   Total days of antibiotics 9        Day 4 ampicillin and gent        2 days of  vanco and piptazo        2 Days of azithromycin and ceftriaxone (D/c'd 11/11)    ID: [name] is a 71 y.o. 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  Principal Problem:  *NSTEMI (non-ST elevated myocardial infarction)    Subjective: Afebrile.    Medications:     . ampicillin (OMNIPEN) IV  2 g Intravenous Q4H  . antiseptic oral rinse  15 mL Mouth Rinse BID  . aspirin EC  81 mg Oral Daily  . atorvastatin  40 mg Oral q1800  . budesonide-formoterol  2 puff Inhalation BID  . cefTRIAXone (ROCEPHIN)  IV  2 g Intravenous Q12H  . ferrous sulfate  325 mg Oral Q breakfast  . furosemide  40 mg Intravenous BID  . hydroxychloroquine  200 mg Oral BID  . losartan  50 mg Oral Daily  . metoprolol  25 mg Oral BID  . pantoprazole  40 mg Oral BID AC  . potassium chloride  20 mEq Oral BID  . predniSONE  40 mg Oral Q breakfast  . rOPINIRole  0.5 mg Oral BID  . sodium chloride  10-40 mL Intracatheter Q12H  . sodium chloride  3 mL Intravenous Q12H  . sulfaSALAzine  1,000 mg Oral BID  . [DISCONTINUED] furosemide  40 mg Intravenous Daily  . [DISCONTINUED] potassium chloride  20 mEq Oral Daily    Objective: Vital signs in last 24 hours: Temp:  [97.5 F (36.4 C)-98 F (36.7 C)] 97.5 F (36.4 C) (11/18 0800) Pulse Rate:  [79-94] 94  (11/18 0800) Resp:  [18-19] 18  (11/18 0800) BP: (133-170)/(67-98) 170/98 mmHg (11/18 0800) SpO2:  [97 %-100 %] 100 % (11/18 0800)  SpO2: [89 %-100 %] 95 % (11/13 1650)  Gen= a x o by 3, in NAD Neck: no adenopathy, no carotid bruit, no JVD and supple, symmetrical, trachea midline  Lungs: CTAB, no wheezing or rhonchi  Heart: irregularly irregular , S1, S2 normal and Soft systolic murmur noted  Abdomen: soft, non-tender; bowel sounds normal; no masses, no  organomegaly  Extremities: extremities normal, atraumatic, no cyanosis or edema. Right PICC line c/d/i   Lab Results  Basename 02/11/12 0414 02/10/12 0520 02/09/12 0439  WBC 4.7 6.1 --  HGB 8.4* 9.3* --  HCT 26.3* 28.5* --  NA 138 -- 137  K 4.0 -- 4.3  CL 103 -- 103  CO2 25 -- 25  BUN 21 -- 22  CREATININE 1.14* -- 1.10  GLU -- -- --    Microbiology: 11/13 blood cx  X 2 NGTD 11/11 urine cx enterococcus amp-S 11/10 blood cx x 2: enterococcus: amp-S  Studies/Results: No results found.  Assessment/Plan: 71 yo woman with enterococcal  native mitral valve endocarditis with possible tricuspid involvement, since she has new TR regurgitant, but no vegetation was seen on echo. nor on prosthetic aortic valve.  - currently on ampicillin 2gm IV Q 4hr and ceftriaxone 2gm IV Q12hr to minimize nephrotoxocity.   - documented clearance of enterococcal bacteremia on 11/13 which we will count as day #1  - we will treat for at least 6 wks of IV antibiotics, last day of antibiotics Dec 24th.   and have her follow up in  ID clinic in 4-6 wks.  - will need to ask home health if they can provide pump for continuous infusion or programmed infusion for ampicillin  Will sign off  Drue Second [facility] Cell: [phone] Pager: [phone]  02/11/2012, 9:45 AM

## 2012-02-12 ENCOUNTER — Encounter (HOSPITAL_COMMUNITY): Payer: Self-pay

## 2012-02-12 ENCOUNTER — Encounter (HOSPITAL_COMMUNITY)
Admission: EM | Disposition: A | Discharge status: Home / Self Care | Payer: Self-pay | Source: Home / Self Care | Attending: Cardiology

## 2012-02-12 HISTORY — PX: ESOPHAGOGASTRODUODENOSCOPY: SHX5428

## 2012-02-12 LAB — PROTIME-INR: INR: 1.62 — ABNORMAL HIGH (ref 0.00–1.49)

## 2012-02-12 LAB — BASIC METABOLIC PANEL
BUN: 26 mg/dL — ABNORMAL HIGH (ref 6–23)
CO2: 25 mEq/L (ref 19–32)
CO2: 26 mEq/L (ref 19–32)
Calcium: 8.4 mg/dL (ref 8.4–10.5)
Chloride: 104 mEq/L (ref 96–112)
Creatinine, Ser: 2.14 mg/dL — ABNORMAL HIGH (ref 0.50–1.10)
GFR calc Af Amer: 25 mL/min — ABNORMAL LOW (ref >90)
GFR calc non Af Amer: 32 mL/min — ABNORMAL LOW (ref >90)
GFR calc non Af Amer: 22 mL/min — ABNORMAL LOW (ref >90)
Glucose, Bld: 156 mg/dL — ABNORMAL HIGH (ref 70–99)
Potassium: 3.8 mEq/L (ref 3.5–5.1)
Sodium: 138 mEq/L (ref 135–145)
Sodium: 138 mEq/L (ref 135–145)

## 2012-02-12 LAB — CBC
HCT: 19.1 % — ABNORMAL LOW (ref 36.0–46.0)
HCT: 18.7 % — ABNORMAL LOW (ref 36.0–46.0)
Hemoglobin: 6.3 g/dL — CL (ref 12.0–15.0)
Hemoglobin: 6 g/dL — CL (ref 12.0–15.0)
MCH: 29.1 pg (ref 26.0–34.0)
MCV: 97.4 fL (ref 78.0–100.0)
MCV: 86.7 fL (ref 78.0–100.0)
Platelets: 112 10*3/uL — ABNORMAL LOW (ref 150–400)
RBC: 1.96 MIL/uL — ABNORMAL LOW (ref 3.87–5.11)
RBC: 1.93 MIL/uL — ABNORMAL LOW (ref 3.87–5.11)
RBC: 3.09 MIL/uL — ABNORMAL LOW (ref 3.87–5.11)
RDW: 16.2 % — ABNORMAL HIGH (ref 11.5–15.5)
RDW: 19.2 % — ABNORMAL HIGH (ref 11.5–15.5)
WBC: 14.6 10*3/uL — ABNORMAL HIGH (ref 4.0–10.5)
WBC: 15.6 10*3/uL — ABNORMAL HIGH (ref 4.0–10.5)

## 2012-02-12 LAB — PREPARE RBC (CROSSMATCH)

## 2012-02-12 SURGERY — EGD (ESOPHAGOGASTRODUODENOSCOPY)
Anesthesia: Moderate Sedation

## 2012-02-12 MED ORDER — OXYCODONE-ACETAMINOPHEN 5-325 MG PO TABS
1.0000 | ORAL_TABLET | Freq: Three times a day (TID) | ORAL | Status: DC | PRN
  Administered 2012-02-12 – 2012-02-15 (×4): 1 via ORAL
  Filled 2012-02-12 (×5): qty 1

## 2012-02-12 MED ORDER — FUROSEMIDE 10 MG/ML IJ SOLN
40.0000 mg | Freq: Every day | INTRAMUSCULAR | Status: DC
  Administered 2012-02-12 – 2012-02-17 (×6): 40 mg via INTRAVENOUS
  Filled 2012-02-12 (×6): qty 4

## 2012-02-12 MED ORDER — SODIUM CHLORIDE 0.9 % IV SOLN: INTRAVENOUS | Status: DC

## 2012-02-12 MED ORDER — ACETAMINOPHEN 325 MG PO TABS
650.0000 mg | ORAL_TABLET | ORAL | Status: DC | PRN
  Administered 2012-02-16: 650 mg via ORAL
  Filled 2012-02-12 (×2): qty 2

## 2012-02-12 MED ORDER — HEPARIN (PORCINE) IN NACL 100-0.45 UNIT/ML-% IJ SOLN
1900.0000 [IU]/h | INTRAMUSCULAR | Status: DC
  Filled 2012-02-12 (×2): qty 250

## 2012-02-12 MED ORDER — PREDNISONE 20 MG PO TABS
20.0000 mg | ORAL_TABLET | Freq: Every day | ORAL | Status: DC
  Administered 2012-02-13 – 2012-02-17 (×5): 20 mg via ORAL
  Filled 2012-02-12 (×6): qty 1

## 2012-02-12 MED ORDER — MIDAZOLAM HCL 5 MG/ML IJ SOLN
INTRAMUSCULAR | Status: AC
  Filled 2012-02-12: qty 2

## 2012-02-12 MED ORDER — MIDAZOLAM HCL 10 MG/2ML IJ SOLN
INTRAMUSCULAR | Status: DC | PRN
  Administered 2012-02-12 (×2): 2 mg via INTRAVENOUS

## 2012-02-12 MED ORDER — SODIUM CHLORIDE 0.9 % IV BOLUS (SEPSIS)
500.0000 mL | Freq: Once | INTRAVENOUS | Status: AC
  Administered 2012-02-12: 500 mL via INTRAVENOUS

## 2012-02-12 MED ORDER — FENTANYL CITRATE 0.05 MG/ML IJ SOLN
INTRAMUSCULAR | Status: DC | PRN
  Administered 2012-02-12 (×2): 25 ug via INTRAVENOUS

## 2012-02-12 MED ORDER — FENTANYL CITRATE 0.05 MG/ML IJ SOLN
INTRAMUSCULAR | Status: AC
  Filled 2012-02-12: qty 2

## 2012-02-12 MED ORDER — BUTAMBEN-TETRACAINE-BENZOCAINE 2-2-14 % EX AERO
INHALATION_SPRAY | CUTANEOUS | Status: DC | PRN
  Administered 2012-02-12: 2 via TOPICAL

## 2012-02-12 MED ORDER — ONDANSETRON HCL 4 MG/2ML IJ SOLN
INTRAMUSCULAR | Status: AC
  Administered 2012-02-12: 03:00:00
  Filled 2012-02-12: qty 2

## 2012-02-12 MED ORDER — FLUCONAZOLE 100 MG PO TABS
100.0000 mg | ORAL_TABLET | Freq: Every day | ORAL | Status: DC
  Administered 2012-02-13 – 2012-02-17 (×5): 100 mg via ORAL
  Filled 2012-02-12 (×6): qty 1

## 2012-02-12 MED ORDER — ONDANSETRON HCL 4 MG/2ML IJ SOLN: 4.0000 mg | Freq: Four times a day (QID) | INTRAMUSCULAR | Status: DC | PRN

## 2012-02-12 NOTE — Op Note (Addendum)
[facility] [address] [place], [address]   OPERATIVE PROCEDURE REPORT  PATIENT: [name], [name]  MR#: [id number] BIRTHDATE: 1940-11-04  GENDER: Female ENDOSCOPIST: Jeani Hawking, MD ASSISTANT:   Kandice Robinsons, technician and Anthony Sar, RN PROCEDURE DATE: 02/12/2012 PROCEDURE:   EGD w/ snare technique and EGD w/ control of bleeding ASA CLASS:   Class IV INDICATIONS:Anemia and Heme positive stool. MEDICATIONS: Versed 4 mg IV and Fentanyl 50 mcg IV TOPICAL ANESTHETIC:  DESCRIPTION OF PROCEDURE:   After the risks benefits and alternatives of the procedure were thoroughly explained, informed consent was obtained.  The Pentax Gastroscope [id number]  endoscope was introduced through the mouth  and advanced to the second portion of the duodenum Without limitations.      The instrument was slowly withdrawn as the mucosa was fully examined.      ESOPHAGUS: White exudates consistent with candidiasis were found in the esophagus.  In the distal esophagus a mild Schatzki's ring was noted in the setting of a hiatal hernia.  In the antrum 7 4 mm polyps wereidentified.  Two  of the polyps were noted to be mildly oozing, which clearly identified the source of bleeding.  The duodenum was normal.  The polyps were all removed with a hot snare. One of the sites continued to ooze and two hemoclips were placed. No furtehr bleeding was noted..   Retroflexed views revealed no abnormalities.     The scope was then withdrawn from the patient and the procedure terminated.  COMPLICATIONS: There were no complications.  IMPRESSION: 1) Bleeding antral polyps s/p resection. 2) Candidal esophagitis. 3) Nonobstructive Schatzki's ring. 4) Hiatal hernia 2-3 cm.  RECOMMENDATIONS: 1) Follow HGB and transfuse as necessary. 2) Avoid anticoagulation if possible for a week, however, ASA is okay. 3) Fluconazole x 10 days.   _______________________________ Activated:   02/12/2012 3:17 PM    PATIENT NAME:  [name], [name] MR#: [id number]

## 2012-02-12 NOTE — Progress Notes (Signed)
Subjective:  Events of last night noted patient receiving packed RBCs has significant drop in hemoglobin to 6 g. Difficult situation as patient had chronic atrial fibrillation with left atrial thrombus. Patient is off all anticoagulants for now GI consult has been called  Objective:  Vital Signs in the last 24 hours: Temp:  [97.3 F (36.3 C)-98.3 F (36.8 C)] 97.3 F (36.3 C) (11/19 0849) Pulse Rate:  [83-103] 103  (11/19 0849) Resp:  [18-20] 20  (11/19 0654) BP: (77-155)/(29-97) 90/48 mmHg (11/19 0730) SpO2:  [90 %-100 %] 100 % (11/19 0849) FiO2 (%):  [98 %] 98 % (11/18 2036)  Intake/Output from previous day: 11/18 0701 - 11/19 0700 In: 1950 [P.O.:840; I.V.:902; IV Piggyback:208] Out: 4650 [Urine:4650] Intake/Output from this shift:    Physical Exam: Neck: no adenopathy, no carotid bruit, no JVD and supple, symmetrical, trachea midline Lungs: clear to auscultation bilaterally Heart: irregularly irregular rhythm, S1, S2 normal and Soft systolic murmur noted Abdomen: soft, non-tender; bowel sounds normal; no masses,  no organomegaly Extremities: No clubbing cyanosis 3+ edema noted  Lab Results:  Basename 02/12/12 0453 02/12/12 0400  WBC 15.6* 14.6*  HGB 6.0* 6.3*  PLT 157 159    Basename 02/12/12 0400 02/11/12 0414  NA 138 138  K 3.8 4.0  CL 104 103  CO2 25 25  GLUCOSE 156* 150*  BUN 26* 21  CREATININE 1.57* 1.14*   No results found for this basename: TROPONINI:2,CK,MB:2 in the last 72 hours Hepatic Function Panel No results found for this basename: PROT,ALBUMIN,AST,ALT,ALKPHOS,BILITOT,BILIDIR,IBILI in the last 72 hours No results found for this basename: CHOL in the last 72 hours No results found for this basename: PROTIME in the last 72 hours  Imaging: Imaging results have been reviewed and No results found.  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 fib with left atrial thrombus and risk of embolic stroke versus risk of re\re bleeding. Hopefully  may be able to find the source of bleeding so we can restart anticoagulant  LOS: 9 days    [name] N 02/12/2012, 9:48 AM

## 2012-02-12 NOTE — Progress Notes (Signed)
Subjective:  Patient had developed blue toes after starting Coumadin probably secondary to atheroemboli. Results of EGD noted patient successfully underwent polypectomy .  Objective:  Vital Signs in the last 24 hours: Temp:  [97.3 F (36.3 C)-98.3 F (36.8 C)] 97.7 F (36.5 C) (11/19 1152) Pulse Rate:  [88-103] 95  (11/19 1152) Resp:  [18-20] 20  (11/19 1045) BP: (77-155)/(29-81) 92/31 mmHg (11/19 1500) SpO2:  [83 %-100 %] 83 % (11/19 1500) FiO2 (%):  [98 %] 98 % (11/18 2036)  Intake/Output from previous day: 11/18 0701 - 11/19 0700 In: 1950 [P.O.:840; I.V.:902; IV Piggyback:208] Out: 4650 [Urine:4650] Intake/Output from this shift:    Physical Exam: Neck: no adenopathy, no carotid bruit, no JVD and supple, symmetrical, trachea midline Lungs: Clear anterolaterally Heart: irregularly irregular rhythm, S1, S2 normal and Soft systolic murmur noted Abdomen: soft, non-tender; bowel sounds normal; no masses,  no organomegaly Extremities: extremities normal, atraumatic, no cyanosis or edema and 2+ edema with blue discoloration of toes suggestive of livedo  reticularis  Lab Results:  Basename 02/12/12 1309 02/12/12 0453 02/12/12 0400  WBC -- 15.6* 14.6*  HGB 7.3* 6.0* --  PLT -- 157 159    Basename 02/12/12 0400 02/11/12 0414  NA 138 138  K 3.8 4.0  CL 104 103  CO2 25 25  GLUCOSE 156* 150*  BUN 26* 21  CREATININE 1.57* 1.14*   No results found for this basename: TROPONINI:2,CK,MB:2 in the last 72 hours Hepatic Function Panel No results found for this basename: PROT,ALBUMIN,AST,ALT,ALKPHOS,BILITOT,BILIDIR,IBILI in the last 72 hours No results found for this basename: CHOL in the last 72 hours No results found for this basename: PROTIME in the last 72 hours  Imaging: Imaging results have been reviewed and No results found.  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

## 2012-02-12 NOTE — Progress Notes (Signed)
eLink Physician-Brief Progress Note Patient Name: [name] DOB: 05/09/1940 MRN: [id number]  Date of Service  02/12/2012   HPI/Events of Note   Low BP, neg 2.6 liters today  eICU Interventions  bolus   Intervention Category Major Interventions: Hypotension - evaluation and management  Nelda Bucks. 02/12/2012, 4:05 AM

## 2012-02-12 NOTE — H&P (View-Only) (Signed)
Subjective:  Events of last night noted patient receiving packed RBCs has significant drop in hemoglobin to 6 g. Difficult situation as patient had chronic atrial fibrillation with left atrial thrombus. Patient is off all anticoagulants for now GI consult has been called  Objective:  Vital Signs in the last 24 hours: Temp:  [97.3 F (36.3 C)-98.3 F (36.8 C)] 97.3 F (36.3 C) (11/19 0849) Pulse Rate:  [83-103] 103  (11/19 0849) Resp:  [18-20] 20  (11/19 0654) BP: (77-155)/(29-97) 90/48 mmHg (11/19 0730) SpO2:  [90 %-100 %] 100 % (11/19 0849) FiO2 (%):  [98 %] 98 % (11/18 2036)  Intake/Output from previous day: 11/18 0701 - 11/19 0700 In: 1950 [P.O.:840; I.V.:902; IV Piggyback:208] Out: 4650 [Urine:4650] Intake/Output from this shift:    Physical Exam: Neck: no adenopathy, no carotid bruit, no JVD and supple, symmetrical, trachea midline Lungs: clear to auscultation bilaterally Heart: irregularly irregular rhythm, S1, S2 normal and Soft systolic murmur noted Abdomen: soft, non-tender; bowel sounds normal; no masses,  no organomegaly Extremities: No clubbing cyanosis 3+ edema noted  Lab Results:  Basename 02/12/12 0453 02/12/12 0400  WBC 15.6* 14.6*  HGB 6.0* 6.3*  PLT 157 159    Basename 02/12/12 0400 02/11/12 0414  NA 138 138  K 3.8 4.0  CL 104 103  CO2 25 25  GLUCOSE 156* 150*  BUN 26* 21  CREATININE 1.57* 1.14*   No results found for this basename: TROPONINI:2,CK,MB:2 in the last 72 hours Hepatic Function Panel No results found for this basename: PROT,ALBUMIN,AST,ALT,ALKPHOS,BILITOT,BILIDIR,IBILI in the last 72 hours No results found for this basename: CHOL in the last 72 hours No results found for this basename: PROTIME in the last 72 hours  Imaging: Imaging results have been reviewed and No results found.  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 fib with left atrial thrombus and risk of embolic stroke versus risk of re\re bleeding. Hopefully  may be able to find the source of bleeding so we can restart anticoagulant  LOS: 9 days    Florita Nitsch N 02/12/2012, 9:48 AM    

## 2012-02-12 NOTE — Progress Notes (Signed)
3045  Pt up to bedside commode, pale diaphoretic. vomited and had loose diarrhea stool. Blood pressure dropped to 83/46. Heart rate 86 and continues to be in afib. Back to bed with assistance and 4mg  iv Zofran given.

## 2012-02-12 NOTE — Interval H&P Note (Signed)
History and Physical Interval Note:  02/12/2012 2:51 PM  [name]  has presented today for surgery, with the diagnosis of Anemia and Heme positive stool  The various methods of treatment have been discussed with the patient and family. After consideration of risks, benefits and other options for treatment, the patient has consented to  Procedure(s) (LRB) with comments: ESOPHAGOGASTRODUODENOSCOPY (EGD) (N/A) as a surgical intervention .  The patient's history has been reviewed, patient examined, no change in status, stable for surgery.  I have reviewed the patient's chart and labs.  Questions were answered to the patient's satisfaction.     Kanin Lia D

## 2012-02-12 NOTE — Progress Notes (Signed)
ANTICOAGULATION CONSULT NOTE - Follow Up Consult  Pharmacy Consult for heparin Indication: atrial fibrillation and NSTEMI  Heparin dosing wt=85.5kg  Labs:  Basename 02/12/12 0453 02/12/12 0400 02/11/12 0414 02/10/12 0520  HGB 6.0* 6.3* -- --  HCT 18.7* 19.1* 26.3* --  PLT 157 159 117* --  APTT -- -- -- --  LABPROT -- 18.7* -- --  INR -- 1.62* -- --  HEPARINUNFRC -- 0.61 0.41 0.52  CREATININE -- 1.57* 1.14* --  CKTOTAL -- -- -- --  CKMB -- -- -- --  TROPONINI -- -- -- --    Assessment: [name]  on heparin for NSTEMI/afib with supratherapeutic heparin level on 2100 units/hr and at  new goal (HL=0.61). Noted with prior AVR/ CABG, now enterococcal mitral valve endocarditis, and valvular afib. Noted patient was on apixiban PTA indicated for non-valvular afib with last dose taken on 11/9. She does not want to pursue cardiac cath. Patient does have a history of GI bleed.  Spoke with Dr. Sharyn Lull who would like a Heparin goal of 0.3-0.5 and INR goal of 2 - 2.5. Patient does not remember her coumadin dose in the past.   Overnight events: patients am cbc showed a hgb of 6, orders to hold all anticoagulation heparin/coumadin, transfuse 1 unit and recheck cbc. Will continue to follow status of resuming anticoagulation when bleeding can be ruled out.   Goal of Therapy:  Heparin goal 0.3-0.5 INR goal 2.0-2.5   Plan:  1.Hold anticoagulation for now 2.Follow up cbc  Thank you,  Sheppard Coil, PharmD 02/12/2012 9:01 AM

## 2012-02-12 NOTE — Progress Notes (Signed)
I discussed the case with Dr. Sharyn Lull with her anticoagulation.  I feel that she is at high risk for bleeding in light of the polypectomy.  I am recommending that anticoagulation be held off for a week, but if there is any evidence of emboli to the legs anticoagulation will take precedence.  The resected sites in the antrum need time to heal.  She is at high risk for emboli from the atrial thrombus, but treatment options are extremely limited.  I also discussed the situation with her family members, who acknowledge understanding.

## 2012-02-12 NOTE — Progress Notes (Signed)
PT Cancellation Note  Patient Details Name: [name] MRN: [id number] DOB: [birth year]   Cancelled Treatment:    Reason Eval/Treat Not Completed: Medical issues which prohibited therapy. Hgb: 6. Will f/u tomorrow.    [facility] HELEN 02/12/2012, 11:08 AM Pager: [phone]

## 2012-02-12 NOTE — Consult Note (Signed)
Reason for Consult: Anemia and Heme positive stool Referring Physician: Rinaldo Cloud, M.D.  Charlann Lange HPI: This is a 71 year old female with admitted for a non STEMI.  She was symptomatic with SOB and subsequently she was noted to have a mild elevation in her troponin.  Since her hospitalization she remained stable, but she dropped her HGB by 2 grams overnight.  No evidence of any hematochezia or melena, but she was heme positive.  On 04/08/2011 she had an EGD/Colonoscopy by Dr. Juanda Chance, which was negative.  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  Subsequently a capsule endoscopy was performed and it was negative for any overt source of bleeding.  In the past she was on coumadin and most recently heparin, but the heparin was stopped.  Past Medical History  Diagnosis Date  . Coronary artery disease   . Atrial fibrillation     Now NSR  . GERD (gastroesophageal reflux disease)   . Hyperlipidemia   . 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 Anemia   . Angina   . Shortness of breath     with activity  . Hypertension   . Blood transfusion   . Pneumonia   . H/O hiatal hernia   . Lupus     Past Surgical History  Procedure Date  . Cardiac catheterization   . Cardiac valve replacement     2006  . Coronary artery bypass graft   . Givens capsule study 04/09/2011    Procedure: GIVENS CAPSULE STUDY;  Surgeon: Theda Belfast, MD;  Location: [facility] ENDOSCOPY;  Service: Endoscopy;  Laterality: N/A;  . Tee without cardioversion 02/06/2012    Procedure: TRANSESOPHAGEAL ECHOCARDIOGRAM (TEE);  Surgeon: Ricki Rodriguez, MD;  Location: [facility] ENDOSCOPY;  Service: Cardiovascular;  Laterality: N/A;    History reviewed. No pertinent family history.  Social History:  reports that she has never smoked. She has never used smokeless tobacco. She reports that she does not drink alcohol. Her drug history not on file.  Allergies:  Allergies  Allergen Reactions  .  Codeine     REACTION: nausea  . Codeine Phosphate     REACTION: unspecified    Medications:  Scheduled:   . ampicillin (OMNIPEN) IV  2 g Intravenous Q4H  . antiseptic oral rinse  15 mL Mouth Rinse BID  . atorvastatin  40 mg Oral q1800  . budesonide-formoterol  2 puff Inhalation BID  . cefTRIAXone (ROCEPHIN)  IV  2 g Intravenous Q12H  . ferrous sulfate  325 mg Oral Q breakfast  . furosemide  40 mg Intravenous Daily  . hydroxychloroquine  200 mg Oral BID  . metoprolol  25 mg Oral BID  . [COMPLETED] ondansetron      . pantoprazole  40 mg Oral BID AC  . [COMPLETED] patient's guide to using coumadin book   Does not apply Once  . potassium chloride  20 mEq Oral BID  . predniSONE  20 mg Oral Q breakfast  . rOPINIRole  1 mg Oral QHS  . [COMPLETED] sodium chloride  500 mL Intravenous Once  . sodium chloride  10-40 mL Intracatheter Q12H  . sodium chloride  3 mL Intravenous Q12H  . sulfaSALAzine  1,000 mg Oral BID  . [COMPLETED] warfarin  5 mg Oral ONCE-1800  . [DISCONTINUED] furosemide  40 mg Intravenous BID  . [DISCONTINUED] losartan  50 mg Oral Daily  . [DISCONTINUED] predniSONE  40  mg Oral Q breakfast  . [DISCONTINUED] warfarin   Does not apply Once  . [DISCONTINUED] Warfarin - Pharmacist Dosing Inpatient   Does not apply q1800   Continuous:   . sodium chloride 20 mL/hr at 02/11/12 0600  . [DISCONTINUED] heparin 2,100 Units/hr (02/12/12 0400)  . [DISCONTINUED] heparin      Results for orders placed during the hospital encounter of 02/03/12 (from the past 24 hour(s))  CBC     Status: Abnormal   Collection Time   02/12/12  4:00 AM      Component Value Range   WBC 14.6 (*) 4.0 - 10.5 K/uL   RBC 1.96 (*) 3.87 - 5.11 MIL/uL   Hemoglobin 6.3 (*) 12.0 - 15.0 g/dL   HCT 54.0 (*) 98.1 - 19.1 %   MCV 97.4  78.0 - 100.0 fL   MCH 32.1  26.0 - 34.0 pg   MCHC 33.0  30.0 - 36.0 g/dL   RDW 47.8 (*) 29.5 - 62.1 %   Platelets 159  150 - 400 K/uL  HEPARIN LEVEL (UNFRACTIONATED)      Status: Normal   Collection Time   02/12/12  4:00 AM      Component Value Range   Heparin Unfractionated 0.61  0.30 - 0.70 IU/mL  BASIC METABOLIC PANEL     Status: Abnormal   Collection Time   02/12/12  4:00 AM      Component Value Range   Sodium 138  135 - 145 mEq/L   Potassium 3.8  3.5 - 5.1 mEq/L   Chloride 104  96 - 112 mEq/L   CO2 25  19 - 32 mEq/L   Glucose, Bld 156 (*) 70 - 99 mg/dL   BUN 26 (*) 6 - 23 mg/dL   Creatinine, Ser 3.08 (*) 0.50 - 1.10 mg/dL   Calcium 8.2 (*) 8.4 - 10.5 mg/dL   GFR calc non Af Amer 32 (*) >90 mL/min   GFR calc Af Amer 37 (*) >90 mL/min  PROTIME-INR     Status: Abnormal   Collection Time   02/12/12  4:00 AM      Component Value Range   Prothrombin Time 18.7 (*) 11.6 - 15.2 seconds   INR 1.62 (*) 0.00 - 1.49  PREPARE RBC (CROSSMATCH)     Status: Normal   Collection Time   02/12/12  4:35 AM      Component Value Range   Order Confirmation ORDER PROCESSED BY BLOOD BANK    TYPE AND SCREEN     Status: Normal (Preliminary result)   Collection Time   02/12/12  4:35 AM      Component Value Range   ABO/RH(D) O POS     Antibody Screen NEG     Sample Expiration 02/15/2012     Unit Number [id number]     Blood Component Type RBC LR PHER1     Unit division 00     Status of Unit ISSUED     Transfusion Status OK TO TRANSFUSE     Crossmatch Result Compatible     Unit Number [id number]     Blood Component Type RBC LR PHER2     Unit division 00     Status of Unit ISSUED     Transfusion Status OK TO TRANSFUSE     Crossmatch Result Compatible     Unit Number [id number]     Blood Component Type RBC LR PHER2     Unit division 00     Status of  Unit ALLOCATED     Transfusion Status OK TO TRANSFUSE     Crossmatch Result Compatible     Unit Number [id number]     Blood Component Type RED CELLS,LR     Unit division 00     Status of Unit ALLOCATED     Transfusion Status OK TO TRANSFUSE     Crossmatch Result Compatible    CBC     Status:  Abnormal   Collection Time   02/12/12  4:53 AM      Component Value Range   WBC 15.6 (*) 4.0 - 10.5 K/uL   RBC 1.93 (*) 3.87 - 5.11 MIL/uL   Hemoglobin 6.0 (*) 12.0 - 15.0 g/dL   HCT 56.2 (*) 13.0 - 86.5 %   MCV 96.9  78.0 - 100.0 fL   MCH 31.1  26.0 - 34.0 pg   MCHC 32.1  30.0 - 36.0 g/dL   RDW 78.4 (*) 69.6 - 29.5 %   Platelets 157  150 - 400 K/uL  PREPARE RBC (CROSSMATCH)     Status: Normal   Collection Time   02/12/12 10:14 AM      Component Value Range   Order Confirmation ORDER PROCESSED BY BLOOD BANK    PREPARE RBC (CROSSMATCH)     Status: Normal   Collection Time   02/12/12 11:29 AM      Component Value Range   Order Confirmation ORDER PROCESSED BY BLOOD BANK    HEMOGLOBIN AND HEMATOCRIT, BLOOD     Status: Abnormal   Collection Time   02/12/12  1:09 PM      Component Value Range   Hemoglobin 7.3 (*) 12.0 - 15.0 g/dL   HCT 28.4 (*) 13.2 - 44.0 %     No results found.  ROS:  As stated above in the HPI otherwise negative.  Blood pressure 94/53, pulse 95, temperature 97.7 F (36.5 C), temperature source Oral, resp. rate 20, height 5\' 6"  (1.676 m), weight 102 kg (224 lb 13.9 oz), SpO2 100.00%.    PE: Gen: NAD, Alert and Oriented HEENT:  [place]/AT, EOMI Neck: Supple, no LAD Lungs: CTA Bilaterally CV: Irreg, Irreg. ABM: Soft, NTND, +BS Ext: No C/C/E RECTAL: brown liquid stool, heme positive  Assessment/Plan: 1) Heme positive stool. 2) Acute drop in HGB.  Plan: 1) EGD now.  Pending the results a repeat colonoscopy +/- capsule endo may be performed.  Delwyn Scoggin D 02/12/2012, 1:51 PM

## 2012-02-12 NOTE — Progress Notes (Signed)
eLink Physician-Brief Progress Note Patient Name: [name] DOB: Jan 20, 1941 MRN: [id number]  Date of Service  02/12/2012   HPI/Events of Note   Repeat cbc low hgb 6  eICU Interventions  Hold coum, Tx 1 unit   Intervention Category Intermediate Interventions: Hypovolemia - evaluation and management  Reathel Turi J. 02/12/2012, 5:47 AM

## 2012-02-13 ENCOUNTER — Encounter (HOSPITAL_COMMUNITY): Payer: Self-pay | Admitting: Gastroenterology

## 2012-02-13 ENCOUNTER — Inpatient Hospital Stay (HOSPITAL_COMMUNITY): Payer: Medicare Other

## 2012-02-13 ENCOUNTER — Encounter (HOSPITAL_COMMUNITY): Payer: Self-pay

## 2012-02-13 DIAGNOSIS — M79609 Pain in unspecified limb: Secondary | ICD-10-CM

## 2012-02-13 LAB — CULTURE, BLOOD (ROUTINE X 2)
Culture: NO GROWTH
Culture: NO GROWTH

## 2012-02-13 LAB — BASIC METABOLIC PANEL
CO2: 23 mEq/L (ref 19–32)
Calcium: 7.7 mg/dL — ABNORMAL LOW (ref 8.4–10.5)
Glucose, Bld: 80 mg/dL (ref 70–99)
Potassium: 4.2 mEq/L (ref 3.5–5.1)
Sodium: 138 mEq/L (ref 135–145)

## 2012-02-13 LAB — PREPARE RBC (CROSSMATCH)

## 2012-02-13 LAB — CBC
Hemoglobin: 10.4 g/dL — ABNORMAL LOW (ref 12.0–15.0)
MCV: 89.8 fL (ref 78.0–100.0)
Platelets: 98 10*3/uL — ABNORMAL LOW (ref 150–400)
Platelets: 99 10*3/uL — ABNORMAL LOW (ref 150–400)
RBC: 2.65 MIL/uL — ABNORMAL LOW (ref 3.87–5.11)
RBC: 3.57 MIL/uL — ABNORMAL LOW (ref 3.87–5.11)
RDW: 19.9 % — ABNORMAL HIGH (ref 11.5–15.5)
WBC: 15.1 10*3/uL — ABNORMAL HIGH (ref 4.0–10.5)
WBC: 15.9 10*3/uL — ABNORMAL HIGH (ref 4.0–10.5)

## 2012-02-13 LAB — HEMOGLOBIN AND HEMATOCRIT, BLOOD: HCT: 25 % — ABNORMAL LOW (ref 36.0–46.0)

## 2012-02-13 LAB — PROTIME-INR
INR: 1.77 — ABNORMAL HIGH (ref 0.00–1.49)
Prothrombin Time: 20 seconds — ABNORMAL HIGH (ref 11.6–15.2)

## 2012-02-13 MED ORDER — FUROSEMIDE 10 MG/ML IJ SOLN: 40.0000 mg | Freq: Every day | INTRAMUSCULAR | Status: DC

## 2012-02-13 MED ORDER — METOPROLOL TARTRATE 25 MG PO TABS
25.0000 mg | ORAL_TABLET | Freq: Three times a day (TID) | ORAL | Status: DC
  Administered 2012-02-13 – 2012-02-17 (×13): 25 mg via ORAL
  Filled 2012-02-13 (×14): qty 1

## 2012-02-13 MED ORDER — WHITE PETROLATUM GEL
Status: AC
  Administered 2012-02-13: 19:00:00
  Filled 2012-02-13: qty 5

## 2012-02-13 NOTE — Progress Notes (Signed)
Subjective:  Patient denies any chest pain or shortness of breath no BM since yesterday. Renal function has deteriorated probably secondary to cardiogenic emboli. And hemoglobin drop from lying gram every 8 g this a.m. Will transfuse in view of recent non-Q-wave MI and upper GI bleed/hypotension tachycardia  Objective:  Vital Signs in the last 24 hours: Temp:  [97.4 F (36.3 C)-99 F (37.2 C)] 99 F (37.2 C) (11/20 0715) Pulse Rate:  [88-123] 123  (11/20 0715) Resp:  [19-20] 19  (11/20 0715) BP: (86-127)/(31-76) 122/69 mmHg (11/20 0715) SpO2:  [83 %-100 %] 98 % (11/20 0825)  Intake/Output from previous day: 11/19 0701 - 11/20 0700 In: 1410.6 [I.V.:510.6; Blood:350; IV Piggyback:550] Out: 825 [Urine:825] Intake/Output from this shift:    Physical Exam: Neck: no adenopathy, no carotid bruit, no JVD and supple, symmetrical, trachea midline Lungs: Decreased breath sound at bases Heart: irregularly irregular rhythm, S1, S2 normal and Soft systolic murmur noted Abdomen: soft, non-tender; bowel sounds normal; no masses,  no organomegaly Extremities: No clubbing cyanosis 2+ edema with blue discoloration of the toes  Lab Results:  Basename 02/13/12 0427 02/12/12 2130  WBC 15.1* 17.2*  HGB 8.0* 9.0*  PLT 98* 112*    Basename 02/13/12 0427 02/12/12 2130  NA 138 138  K 4.2 4.8  CL 105 102  CO2 23 26  GLUCOSE 80 114*  BUN 33* 33*  CREATININE 2.13* 2.14*   No results found for this basename: TROPONINI:2,CK,MB:2 in the last 72 hours Hepatic Function Panel No results found for this basename: PROT,ALBUMIN,AST,ALT,ALKPHOS,BILITOT,BILIDIR,IBILI in the last 72 hours No results found for this basename: CHOL in the last 72 hours No results found for this basename: PROTIME in the last 72 hours  Imaging: Imaging results have been reviewed and No results found.  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

## 2012-02-13 NOTE — Progress Notes (Signed)
PT Cancellation Note  Patient Details Name: [name] MRN: [id number] DOB: 1941/01/27   Cancelled Treatment:    Reason Eval/Treat Not Completed: Medical issues which prohibited therapy. Pt waiting for dopplers LLE. Hgb continues to be low and pt again not feeling well. Will f/u tomorrow.    WHITLOW,Mancil Pfenning HELEN 02/13/2012, 11:28 AM

## 2012-02-13 NOTE — Progress Notes (Signed)
Subjective: No acute events.  Feeling well.  Objective: Vital signs in last 24 hours: Temp:  [97.3 F (36.3 C)-99 F (37.2 C)] 99 F (37.2 C) (11/20 0715) Pulse Rate:  [88-123] 123  (11/20 0715) Resp:  [19-20] 19  (11/20 0715) BP: (86-127)/(31-76) 122/69 mmHg (11/20 0715) SpO2:  [83 %-100 %] 98 % (11/20 0715) Last BM Date: 02/11/12  Intake/Output from previous day: 11/19 0701 - 11/20 0700 In: 1410.6 [I.V.:510.6; Blood:350; IV Piggyback:550] Out: 825 [Urine:825] Intake/Output this shift:    General appearance: alert and no distress GI: soft, non-tender; bowel sounds normal; no masses,  no organomegaly Extremities: purple toes L>R.  Cooler on the left side.  Lab Results:  Basename 02/13/12 0427 02/12/12 2130 02/12/12 1309 02/12/12 0453  WBC 15.1* 17.2* -- 15.6*  HGB 8.0* 9.0* 7.3* --  HCT 23.8* 26.8* 22.1* --  PLT 98* 112* -- 157   BMET  Basename 02/13/12 0427 02/12/12 2130 02/12/12 0400  NA 138 138 138  K 4.2 4.8 3.8  CL 105 102 104  CO2 23 26 25   GLUCOSE 80 114* 156*  BUN 33* 33* 26*  CREATININE 2.13* 2.14* 1.57*  CALCIUM 7.7* 8.4 8.2*   LFT No results found for this basename: PROT,ALBUMIN,AST,ALT,ALKPHOS,BILITOT,BILIDIR,IBILI in the last 72 hours PT/INR  Basename 02/13/12 0427 02/12/12 0400  LABPROT 20.0* 18.7*  INR 1.77* 1.62*   Hepatitis Panel No results found for this basename: HEPBSAG,HCVAB,HEPAIGM,HEPBIGM in the last 72 hours C-Diff No results found for this basename: CDIFFTOX:3 in the last 72 hours Fecal Lactopherrin No results found for this basename: FECLLACTOFRN in the last 72 hours  Studies/Results: No results found.  Medications:  Scheduled:   . ampicillin (OMNIPEN) IV  2 g Intravenous Q4H  . antiseptic oral rinse  15 mL Mouth Rinse BID  . atorvastatin  40 mg Oral q1800  . budesonide-formoterol  2 puff Inhalation BID  . cefTRIAXone (ROCEPHIN)  IV  2 g Intravenous Q12H  . ferrous sulfate  325 mg Oral Q breakfast  . fluconazole  100  mg Oral Daily  . furosemide  40 mg Intravenous Daily  . hydroxychloroquine  200 mg Oral BID  . metoprolol  25 mg Oral BID  . pantoprazole  40 mg Oral BID AC  . potassium chloride  20 mEq Oral BID  . predniSONE  20 mg Oral Q breakfast  . rOPINIRole  1 mg Oral QHS  . sodium chloride  10-40 mL Intracatheter Q12H  . sodium chloride  3 mL Intravenous Q12H  . sulfaSALAzine  1,000 mg Oral BID  . [DISCONTINUED] losartan  50 mg Oral Daily  . [DISCONTINUED] predniSONE  40 mg Oral Q breakfast   Continuous:   . sodium chloride 20 mL/hr at 02/11/12 0600  . sodium chloride    . [DISCONTINUED] heparin 2,100 Units/hr (02/12/12 0400)  . [DISCONTINUED] heparin      Assessment/Plan: 1) Bleeding gastric antral polyps. 2) Afib with atrial thrombus. 3) ? Ischemic toes - Family reports that this is not new.  It has been present for months.   The patient's HGB has dropped down from 9 to 8,but this may be an equilibration issue.  No overt signs of bleeding. If she trends downward again, I will repeat the EGD.  Plan: 1) Continue to hold anticoagulation.  Family is aware of the risks and benefits with holding the anticoagulation and restarting the anticoagulation. 2) Follow up HGB.  LOS: 10 days   [name] D 02/13/2012, 7:49 AM

## 2012-02-13 NOTE — Progress Notes (Signed)
VASCULAR LAB PRELIMINARY  ARTERIAL  ABI completed:    RIGHT    LEFT    PRESSURE WAVEFORM  PRESSURE WAVEFORM  BRACHIAL PICC Line Triphasic BRACHIAL 127 Triphasic  DP 165 Triphasic DP 163 Triphasic         PT 174 Triphasic PT 210 Triphasic                  RIGHT LEFT  ABI 1.37 1.65   ABIs indicate normal arterial flow bilaterally with possible early calcification.  Doppler waveforms are normal throughout bilaterally. Duplex scan of the left lower extremity revealed no evidence of stenosis or significant plaque formation.    Tayshun Gappa, RVS 02/13/2012, 12:19 PM

## 2012-02-13 NOTE — Progress Notes (Signed)
Attempted to see pt today.  Pt waiting for dopplers on her L leg, cont with low Hgb and not feeling well.  Nursing asked  To defer until tomorrow.  Will check back as schedule allows. Tory Emerald, [place] [phone]

## 2012-02-13 NOTE — Progress Notes (Signed)
Spoke with patient regarding TEDs ordered on 02/11/12. Not received, patient declines TEDs at present but is compliant with SCDs, Berle Mull RN

## 2012-02-14 LAB — BASIC METABOLIC PANEL
CO2: 24 mEq/L (ref 19–32)
Calcium: 8.6 mg/dL (ref 8.4–10.5)
GFR calc non Af Amer: 26 mL/min — ABNORMAL LOW (ref >90)
Potassium: 4.8 mEq/L (ref 3.5–5.1)
Sodium: 137 mEq/L (ref 135–145)

## 2012-02-14 LAB — TYPE AND SCREEN
ABO/RH(D): O POS
Antibody Screen: NEGATIVE
Unit division: 0
Unit division: 0

## 2012-02-14 LAB — CBC
HCT: 31.5 % — ABNORMAL LOW (ref 36.0–46.0)
Platelets: 108 10*3/uL — ABNORMAL LOW (ref 150–400)
RBC: 3.51 MIL/uL — ABNORMAL LOW (ref 3.87–5.11)
RDW: 19.3 % — ABNORMAL HIGH (ref 11.5–15.5)
WBC: 16.9 10*3/uL — ABNORMAL HIGH (ref 4.0–10.5)

## 2012-02-14 LAB — PROTIME-INR: INR: 1.47 (ref 0.00–1.49)

## 2012-02-14 MED ORDER — AMPICILLIN SODIUM 2 G IJ SOLR
2.0000 g | Freq: Three times a day (TID) | INTRAMUSCULAR | Status: DC
  Administered 2012-02-14 – 2012-02-17 (×9): 2 g via INTRAVENOUS
  Filled 2012-02-14 (×13): qty 2000

## 2012-02-14 NOTE — Progress Notes (Signed)
Physical Therapy Treatment Patient Details Name: [name] MRN: [id number] DOB: 06-30-1940 Today's Date: 02/14/2012 Time: 9563-8756 PT Time Calculation (min): 39 min  PT Assessment / Plan / Recommendation Comments on Treatment Session  Good progress today after several days of medical complications. Pt appears in better spirits and willing to progress with activity. Educated on walking with nursing staff.     Follow Up Recommendations  Home health PT;Supervision for mobility/OOB     Does the patient have the potential to tolerate intense rehabilitation     Barriers to Discharge        Equipment Recommendations  None recommended by PT    Recommendations for Other Services    Frequency     Plan Discharge plan remains appropriate;Frequency remains appropriate    Precautions / Restrictions Precautions Precautions: Fall Restrictions Weight Bearing Restrictions: No   Pertinent Vitals/Pain DOE 2-3/4 throughout session, able to catch her breath with seated rest breaks very quickly; SaO2 did drop to 88% after first ambulation however quickly back to 90% or above with seated rest break; HR increased to 130s with ambulation with pt in a-fib    Mobility  Bed Mobility Bed Mobility: Supine to Sit;Sitting - Scoot to Edge of Bed Supine to Sit: 4: Min assist;With rails Sitting - Scoot to [place] of Bed: 4: Min guard Details for Bed Mobility Assistance: Assist to lift trunk from bed Transfers Transfers: Sit to Stand;Stand to Sit (3 trials) Sit to Stand: 4: Min assist;With upper extremity assist;From bed;From chair/3-in-1 Stand to Sit: With upper extremity assist;To bed;To chair/3-in-1 Details for Transfer Assistance: minA for stability during transfer with upper extremity support needed in tall standing Ambulation/Gait Ambulation/Gait Assistance: 1: +2 Total assist Ambulation/Gait: Patient Percentage: 80% Ambulation Distance (Feet): 240 Feet (80 ft x3) Assistive device: 2 person hand  held assist Ambulation/Gait Assistance Details: bilateral hand held assist for stability as pt with increased lateral sway, hand held assist also improved pt's tolerance for activity limiting her exertion; 3 seated rest breaks, cues for tall posture and slow steady gait (pt becomes impulsive with fatigue) Gait Pattern: Wide base of support;Decreased stride length;Trunk flexed General Gait Details: increased lateral sway    Exercises General Exercises - Lower Extremity Heel Raises: AROM;Both;10 reps;Seated Mini-Sqauts: AROM;Both;10 reps;Seated     PT Goals Acute Rehab PT Goals PT Goal: Supine/Side to Sit - Progress: Progressing toward goal PT Goal: Sit to Stand - Progress: Progressing toward goal PT Goal: Ambulate - Progress: Progressing toward goal Pt will Perform Home Exercise Program: Independently PT Goal: Perform Home Exercise Program - Progress: Goal set today  Visit Information  Last PT Received On: 02/14/12 Assistance Needed: +1 (+2 for cords) PT/OT Co-Evaluation/Treatment: Yes    Subjective Data  Subjective: I was waiting on you to come back.    Cognition  Overall Cognitive Status: Appears within functional limits for tasks assessed/performed Arousal/Alertness: Awake/alert Orientation Level: Appears intact for tasks assessed Behavior During Session: [facility] for tasks performed    Balance  Static Standing Balance Static Standing - Balance Support: Left upper extremity supported Static Standing - Level of Assistance: 5: Stand by assistance Static Standing - Comment/# of Minutes: stood at the sink for at least 3 minutes with LUE for support   End of Session PT - End of Session Equipment Utilized During Treatment: Gait belt Activity Tolerance: Patient tolerated treatment well Patient left: in chair;with call bell/phone within reach;with family/visitor present Nurse Communication: Mobility status   GP     [facility] HELEN 02/14/2012,  3:57 PM

## 2012-02-14 NOTE — Progress Notes (Signed)
ANTICOAGULATION CONSULT NOTE - Follow Up Consult  Pharmacy Consult for Heparin Indication: Afib and NSTEMI  Allergies  Allergen Reactions  . Codeine     REACTION: nausea  . Codeine Phosphate     REACTION: unspecified    Patient Measurements: Height: 5\' 6"  (167.6 cm) Weight: 224 lb 13.9 oz (102 kg) IBW/kg (Calculated) : 59.3   Vital Signs: Temp: 97.7 F (36.5 C) (11/21 0804) Temp src: Oral (11/21 0804) BP: 129/94 mmHg (11/21 0804)  Labs:  Basename 02/14/12 0430 02/13/12 2155 02/13/12 1000 02/13/12 0427 02/12/12 2130 02/12/12 0400  HGB 10.8* 10.4* -- -- -- --  HCT 31.5* 31.2* 25.0* -- -- --  PLT 108* 99* -- 98* -- --  APTT -- -- -- -- -- --  LABPROT 17.4* -- -- 20.0* -- 18.7*  INR 1.47 -- -- 1.77* -- 1.62*  HEPARINUNFRC -- -- -- -- -- 0.61  CREATININE 1.82* -- -- 2.13* 2.14* --  CKTOTAL -- -- -- -- -- --  CKMB -- -- -- -- -- --  TROPONINI -- -- -- -- -- --    Estimated Creatinine Clearance: 33.2 ml/min (by C-G formula based on Cr of 1.82).   Medications:  Scheduled:    . ampicillin (OMNIPEN) IV  2 g Intravenous Q8H  . antiseptic oral rinse  15 mL Mouth Rinse BID  . atorvastatin  40 mg Oral q1800  . budesonide-formoterol  2 puff Inhalation BID  . cefTRIAXone (ROCEPHIN)  IV  2 g Intravenous Q12H  . ferrous sulfate  325 mg Oral Q breakfast  . fluconazole  100 mg Oral Daily  . furosemide  40 mg Intravenous Daily  . hydroxychloroquine  200 mg Oral BID  . metoprolol  25 mg Oral TID  . pantoprazole  40 mg Oral BID AC  . potassium chloride  20 mEq Oral BID  . predniSONE  20 mg Oral Q breakfast  . rOPINIRole  1 mg Oral QHS  . sodium chloride  10-40 mL Intracatheter Q12H  . sodium chloride  3 mL Intravenous Q12H  . sulfaSALAzine  1,000 mg Oral BID  . [COMPLETED] white petrolatum      . [DISCONTINUED] ampicillin (OMNIPEN) IV  2 g Intravenous Q4H    Assessment: 71yo female with consult of heparin per pharmacy for afib/NSTEMI.  EGD performed 11/19 revealed  bleeding antral polyps which were removed.  Per GI recommendations, patient should avoid anticoagulation for 10 days while the resected sites heal.  Today is day 2 of no anticoagulation.  Goal of Therapy:  Heparin goal 0.3-0.5  INR goal 2.0-2.5   Plan:  Continue to hold heparin/anticoagulation  Lillia Pauls, PharmD Clinical Pharmacist Pager: [phone] Phone: [phone] 02/14/2012 10:54 AM

## 2012-02-14 NOTE — Progress Notes (Signed)
Subjective: Feels well at this time.  No complaints.  Objective: Vital signs in last 24 hours: Temp:  [97.7 F (36.5 C)-99.2 F (37.3 C)] 98.6 F (37 C) (11/21 1222) Pulse Rate:  [95-104] 95  (11/20 1930) Resp:  [16-20] 16  (11/21 0021) BP: (107-136)/(61-94) 129/79 mmHg (11/21 1200) SpO2:  [95 %-100 %] 100 % (11/21 1200) Last BM Date: 02/12/12  Intake/Output from previous day: 11/20 0701 - 11/21 0700 In: 2229.5 [P.O.:930; I.V.:237; Blood:712.5; IV Piggyback:350] Out: 1660 [Urine:1660] Intake/Output this shift: Total I/O In: 532 [P.O.:360; I.V.:60; IV Piggyback:112] Out: 2000 [Urine:2000]  General appearance: alert and no distress GI: soft, non-tender; bowel sounds normal; no masses,  no organomegaly  Lab Results:  Basename 02/14/12 0430 02/13/12 2155 02/13/12 1000 02/13/12 0427  WBC 16.9* 15.9* -- 15.1*  HGB 10.8* 10.4* 8.3* --  HCT 31.5* 31.2* 25.0* --  PLT 108* 99* -- 98*   BMET  Basename 02/14/12 0430 02/13/12 0427 02/12/12 2130  NA 137 138 138  K 4.8 4.2 4.8  CL 101 105 102  CO2 24 23 26   GLUCOSE 112* 80 114*  BUN 34* 33* 33*  CREATININE 1.82* 2.13* 2.14*  CALCIUM 8.6 7.7* 8.4   LFT No results found for this basename: PROT,ALBUMIN,AST,ALT,ALKPHOS,BILITOT,BILIDIR,IBILI in the last 72 hours PT/INR  Basename 02/14/12 0430 02/13/12 0427  LABPROT 17.4* 20.0*  INR 1.47 1.77*   Hepatitis Panel No results found for this basename: HEPBSAG,HCVAB,HEPAIGM,HEPBIGM in the last 72 hours C-Diff No results found for this basename: CDIFFTOX:3 in the last 72 hours Fecal Lactopherrin No results found for this basename: FECLLACTOFRN in the last 72 hours  Studies/Results: US Renal Port  02/13/2012  *RADIOLOGY REPORT*  Clinical Data: Worsening renal function, clinically suspected cardiogenic emboli secondary to to native mitral valve endocarditis, question renal infarct  RENAL/URINARY TRACT ULTRASOUND COMPLETE  Comparison:  None  Findings:  Right Kidney:  10.3 cm length.   Markedly limited visualization secondary to body habitus.  Cortex appears thinned. Unable to assess cortical echogenicity.  No gross evidence of mass or hydronephrosis on extremely limited assessment.  Left Kidney:  12.5 cm length.  Increased cortical echogenicity.  No gross mass or hydronephrosis.  Bladder:  Appears normally distended with fluid despite presence of a Foley catheter, which was not clamped.  Incidentally noted cholelithiasis, calcified stone 1.6 cm diameter measured.  IMPRESSION: Severely limited assessment of the kidneys showing no gross obstruction or mass. Question medical renal disease changes of left kidney. Cholelithiasis. Bladder contains urine despite Foley catheter, question catheter obstruction.  Findings called to [facility] on 2900 on 02/13/2012 at 2128 hours.   Original Report Authenticated By: Ulyses Southward, M.D.     Medications:  Scheduled:   . ampicillin (OMNIPEN) IV  2 g Intravenous Q8H  . antiseptic oral rinse  15 mL Mouth Rinse BID  . atorvastatin  40 mg Oral q1800  . budesonide-formoterol  2 puff Inhalation BID  . cefTRIAXone (ROCEPHIN)  IV  2 g Intravenous Q12H  . ferrous sulfate  325 mg Oral Q breakfast  . fluconazole  100 mg Oral Daily  . furosemide  40 mg Intravenous Daily  . hydroxychloroquine  200 mg Oral BID  . metoprolol  25 mg Oral TID  . pantoprazole  40 mg Oral BID AC  . potassium chloride  20 mEq Oral BID  . predniSONE  20 mg Oral Q breakfast  . rOPINIRole  1 mg Oral QHS  . sodium chloride  10-40 mL Intracatheter Q12H  .  sodium chloride  3 mL Intravenous Q12H  . sulfaSALAzine  1,000 mg Oral BID  . [COMPLETED] white petrolatum      . [DISCONTINUED] ampicillin (OMNIPEN) IV  2 g Intravenous Q4H   Continuous:   . sodium chloride 20 mL/hr at 02/11/12 0600  . sodium chloride      Assessment/Plan: 1) GI bleed s/p antral polypectomies.   The patient clinically appears much stronger today.  Her HGB has increased as a result of the blood  transfusions.  Hopefully it will stay stable.  Plan: 1) Monitor HGB. 2) Continue to hold anticoagulation.  LOS: 11 days   [name] D 02/14/2012, 1:01 PM

## 2012-02-14 NOTE — Progress Notes (Signed)
Subjective:  Patient denies any chest pain or shortness of breath states feels much better. Renal function slightly improved. Hemoglobin remains stable. Had one loose BM but no frank diarrhea tolerating antibiotics okay. Appreciate ID help antibiotic dosing adjusted  Objective:  Vital Signs in the last 24 hours: Temp:  [97.4 F (36.3 C)-99.5 F (37.5 C)] 98.2 F (36.8 C) (11/21 0437) Pulse Rate:  [95-111] 95  (11/20 1930) Resp:  [16-20] 16  (11/21 0021) BP: (107-136)/(55-77) 136/70 mmHg (11/21 0437) SpO2:  [94 %-100 %] 100 % (11/21 0826)  Intake/Output from previous day: 11/20 0701 - 11/21 0700 In: 2229.5 [P.O.:930; I.V.:237; Blood:712.5; IV Piggyback:350] Out: 1660 [Urine:1660] Intake/Output from this shift: Total I/O In: 100 [IV Piggyback:100] Out: -   Physical Exam: Neck: no adenopathy, no carotid bruit, no JVD and supple, symmetrical, trachea midline Lungs: Decreased breath sound at bases with occasional rhonchi  Heart: irregularly irregular rhythm, S1, S2 normal and Soft systolic murmur unchanged Abdomen: soft, non-tender; bowel sounds normal; no masses,  no organomegaly Extremities: No clubbing blue toes noted edema improved  Lab Results:  Basename 02/14/12 0430 02/13/12 2155  WBC 16.9* 15.9*  HGB 10.8* 10.4*  PLT 108* 99*    Basename 02/14/12 0430 02/13/12 0427  NA 137 138  K 4.8 4.2  CL 101 105  CO2 24 23  GLUCOSE 112* 80  BUN 34* 33*  CREATININE 1.82* 2.13*   No results found for this basename: TROPONINI:2,CK,MB:2 in the last 72 hours Hepatic Function Panel No results found for this basename: PROT,ALBUMIN,AST,ALT,ALKPHOS,BILITOT,BILIDIR,IBILI in the last 72 hours No results found for this basename: CHOL in the last 72 hours No results found for this basename: PROTIME in the last 72 hours  Imaging: Imaging results have been reviewed and US Renal Port  02/13/2012  *RADIOLOGY REPORT*  Clinical Data: Worsening renal function, clinically suspected  cardiogenic emboli secondary to to native mitral valve endocarditis, question renal infarct  RENAL/URINARY TRACT ULTRASOUND COMPLETE  Comparison:  None  Findings:  Right Kidney:  10.3 cm length.  Markedly limited visualization secondary to body habitus.  Cortex appears thinned. Unable to assess cortical echogenicity.  No gross evidence of mass or hydronephrosis on extremely limited assessment.  Left Kidney:  12.5 cm length.  Increased cortical echogenicity.  No gross mass or hydronephrosis.  Bladder:  Appears normally distended with fluid despite presence of a Foley catheter, which was not clamped.  Incidentally noted cholelithiasis, calcified stone 1.6 cm diameter measured.  IMPRESSION: Severely limited assessment of the kidneys showing no gross obstruction or mass. Question medical renal disease changes of left kidney. Cholelithiasis. Bladder contains urine despite Foley catheter, question catheter obstruction.  Findings called to [facility] on 2900 on 02/13/2012 at 2128 hours.   Original Report Authenticated By: Ulyses Southward, M.D.     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 Dr. Algie Coffer on call for  me until Monday  LOS: 11 days    Dillin Lofgren N 02/14/2012, 9:53 AM

## 2012-02-14 NOTE — Progress Notes (Signed)
Occupational Therapy Treatment Patient Details Name: [name] MRN: [id number] DOB: 11/19/1940 Today's Date: 02/14/2012 Time: 4098-1191 OT Time Calculation (min): 39 min  OT Assessment / Plan / Recommendation Comments on Treatment Session Pt. requires 3 rest breaks in 39 min treatment session.  Dyspnea 2-3/4 and sats 88-95    Follow Up Recommendations  Home health OT    Barriers to Discharge       Equipment Recommendations  None recommended by OT    Recommendations for Other Services    Frequency Min 2X/week   Plan Discharge plan remains appropriate    Precautions / Restrictions Precautions Precautions: Fall Restrictions Weight Bearing Restrictions: No   Pertinent Vitals/Pain     ADL  Grooming: Brushing hair;Minimal assistance Where Assessed - Grooming: Supported standing Lower Body Dressing: Minimal assistance Where Assessed - Lower Body Dressing: Supported sit to stand Toilet Transfer: Mining engineer Method: Sit to stand;Stand pivot Equipment Used: Gait belt Transfers/Ambulation Related to ADLs: Min A for ambulation ADL Comments: Pt. able to don/doff socks, and brush teeth standing at sink. Pt required 3 rest breaks during session.  Dyspnea 2-3/4 and sats 88-96 on 3 L.  Ambulated in hallway x 2    OT Diagnosis:    OT Problem List:   OT Treatment Interventions:     OT Goals ADL Goals ADL Goal: Grooming - Progress: Progressing toward goals ADL Goal: Lower Body Dressing - Progress: Progressing toward goals Miscellaneous OT Goals OT Goal: Miscellaneous Goal #1 - Progress: Progressing toward goals  Visit Information  Last OT Received On: 02/14/12 Assistance Needed: +1 (+2 for cords) PT/OT Co-Evaluation/Treatment: Yes    Subjective Data      Prior Functioning       Cognition  Overall Cognitive Status: Appears within functional limits for tasks assessed/performed Arousal/Alertness: Awake/alert Orientation Level: Appears  intact for tasks assessed Behavior During Session: [facility] for tasks performed    Mobility  Shoulder Instructions Bed Mobility Bed Mobility: Supine to Sit;Sitting - Scoot to Edge of Bed Supine to Sit: 4: Min assist;With rails Sitting - Scoot to [place] of Bed: 4: Min guard Details for Bed Mobility Assistance: Assist to lift trunk from bed Transfers Transfers: Sit to Stand;Stand to Sit Sit to Stand: 4: Min assist;With upper extremity assist;From bed;From chair/3-in-1 Stand to Sit: 4: Min assist;With upper extremity assist;To chair/3-in-1;To bed Details for Transfer Assistance: assis to manage lines; verbal cues to move slowly and turn fully befor attempting to sit and to control descent       Exercises      Balance     End of Session OT - End of Session Equipment Utilized During Treatment: Gait belt Activity Tolerance: Patient tolerated treatment well Patient left: in chair;with call bell/phone within reach;with family/visitor present Nurse Communication: Mobility status  GO     Jeani Hawking M 02/14/2012, 3:54 PM

## 2012-02-14 NOTE — Progress Notes (Signed)
PT Cancellation Note  Patient Details Name: [name] MRN: [id number] DOB: Dec 12, 1940   Cancelled Treatment:    Reason Eval/Treat Not Completed: Fatigue/lethargy limiting ability to participate. Pt wants to get the foley out before participating with therapy. RN made aware. Will try to come back this afternoon per patient request.    [facility] HELEN 02/14/2012, 10:54 AM Pager: [phone]

## 2012-02-14 NOTE — Progress Notes (Signed)
-   ampicillin dosing interval has been decreased to 2gm IV Q8hr since the patient's Cr Cl < 30. If it is improves, please increase to Q6hr.  Duke Salvia Drue Second MD MPH [facility] [phone]

## 2012-02-15 LAB — BASIC METABOLIC PANEL
CO2: 27 mEq/L (ref 19–32)
Calcium: 8.4 mg/dL (ref 8.4–10.5)
Chloride: 98 mEq/L (ref 96–112)
Potassium: 4.2 mEq/L (ref 3.5–5.1)
Sodium: 132 mEq/L — ABNORMAL LOW (ref 135–145)

## 2012-02-15 LAB — CBC
HCT: 27.9 % — ABNORMAL LOW (ref 36.0–46.0)
Hemoglobin: 9.1 g/dL — ABNORMAL LOW (ref 12.0–15.0)
RBC: 3.07 MIL/uL — ABNORMAL LOW (ref 3.87–5.11)
WBC: 9.7 10*3/uL (ref 4.0–10.5)

## 2012-02-15 LAB — PROTIME-INR: INR: 1.39 (ref 0.00–1.49)

## 2012-02-15 MED ORDER — LOPERAMIDE HCL 2 MG PO CAPS
2.0000 mg | ORAL_CAPSULE | Freq: Three times a day (TID) | ORAL | Status: DC
  Administered 2012-02-15 – 2012-02-17 (×5): 2 mg via ORAL
  Filled 2012-02-15 (×9): qty 1

## 2012-02-15 NOTE — Progress Notes (Signed)
Subjective: No acute events, but she complains about diarrheal bowel movements every time she urinates.  Objective: Vital signs in last 24 hours: Temp:  [97.7 F (36.5 C)-99.5 F (37.5 C)] 98.2 F (36.8 C) (11/22 1142) Pulse Rate:  [77-120] 86  (11/22 1142) Resp:  [16-18] 16  (11/22 0400) BP: (123-150)/(33-85) 123/57 mmHg (11/22 1142) SpO2:  [87 %-100 %] 91 % (11/22 1142) Last BM Date: 02/15/12  Intake/Output from previous day: 11/21 0701 - 11/22 0700 In: 1012 [P.O.:720; I.V.:130; IV Piggyback:162] Out: 2975 [Urine:2975] Intake/Output this shift: Total I/O In: 360 [P.O.:360] Out: -   General appearance: alert and no distress GI: soft, non-tender; bowel sounds normal; no masses,  no organomegaly  Lab Results:  Basename 02/15/12 0430 02/14/12 0430 02/13/12 2155  WBC 9.7 16.9* 15.9*  HGB 9.1* 10.8* 10.4*  HCT 27.9* 31.5* 31.2*  PLT 72* 108* 99*   BMET  Basename 02/15/12 0430 02/14/12 0430 02/13/12 0427  NA 132* 137 138  K 4.2 4.8 4.2  CL 98 101 105  CO2 27 24 23   GLUCOSE 113* 112* 80  BUN 26* 34* 33*  CREATININE 1.24* 1.82* 2.13*  CALCIUM 8.4 8.6 7.7*   LFT No results found for this basename: PROT,ALBUMIN,AST,ALT,ALKPHOS,BILITOT,BILIDIR,IBILI in the last 72 hours PT/INR  Basename 02/15/12 0430 02/14/12 0430  LABPROT 16.7* 17.4*  INR 1.39 1.47   Hepatitis Panel No results found for this basename: HEPBSAG,HCVAB,HEPAIGM,HEPBIGM in the last 72 hours C-Diff No results found for this basename: CDIFFTOX:3 in the last 72 hours Fecal Lactopherrin No results found for this basename: FECLLACTOFRN in the last 72 hours  Studies/Results: US Renal Port  02/13/2012  *RADIOLOGY REPORT*  Clinical Data: Worsening renal function, clinically suspected cardiogenic emboli secondary to to native mitral valve endocarditis, question renal infarct  RENAL/URINARY TRACT ULTRASOUND COMPLETE  Comparison:  None  Findings:  Right Kidney:  10.3 cm length.  Markedly limited visualization  secondary to body habitus.  Cortex appears thinned. Unable to assess cortical echogenicity.  No gross evidence of mass or hydronephrosis on extremely limited assessment.  Left Kidney:  12.5 cm length.  Increased cortical echogenicity.  No gross mass or hydronephrosis.  Bladder:  Appears normally distended with fluid despite presence of a Foley catheter, which was not clamped.  Incidentally noted cholelithiasis, calcified stone 1.6 cm diameter measured.  IMPRESSION: Severely limited assessment of the kidneys showing no gross obstruction or mass. Question medical renal disease changes of left kidney. Cholelithiasis. Bladder contains urine despite Foley catheter, question catheter obstruction.  Findings called to [facility] on 2900 on 02/13/2012 at 2128 hours.   Original Report Authenticated By: Ulyses Southward, M.D.     Medications:  Scheduled:   . ampicillin (OMNIPEN) IV  2 g Intravenous Q8H  . antiseptic oral rinse  15 mL Mouth Rinse BID  . atorvastatin  40 mg Oral q1800  . budesonide-formoterol  2 puff Inhalation BID  . cefTRIAXone (ROCEPHIN)  IV  2 g Intravenous Q12H  . ferrous sulfate  325 mg Oral Q breakfast  . fluconazole  100 mg Oral Daily  . furosemide  40 mg Intravenous Daily  . hydroxychloroquine  200 mg Oral BID  . metoprolol  25 mg Oral TID  . pantoprazole  40 mg Oral BID AC  . potassium chloride  20 mEq Oral BID  . predniSONE  20 mg Oral Q breakfast  . rOPINIRole  1 mg Oral QHS  . sodium chloride  10-40 mL Intracatheter Q12H  . sodium chloride  3  mL Intravenous Q12H  . sulfaSALAzine  1,000 mg Oral BID   Continuous:   . sodium chloride 20 mL/hr at 02/11/12 0600  . sodium chloride      Assessment/Plan: 1) Bleeding antral polyps s/p resection/ 2) Anemia. 3) Diarrhea with urination.   Her HGB has decrease, but no overt signs of bleeding.  I cannot tell for certain if she is still bleeding.  I think tomorrow's HGB will be more telltale.  If it drops then I think a repeat EGD needs  to be pursued.  As for her diarrhea, it may be secondary to her antibiotics, but not C. Diff.  Her WBC has normalized.  Plan: 1) Monitor HGB. 2) Imodium TID.  LOS: 12 days   Cherese Lozano D 02/15/2012, 12:22 PM

## 2012-02-15 NOTE — Progress Notes (Signed)
Physical Therapy Treatment Patient Details Name: TANIEKA POWNALL MRN: [id number] DOB: Dec 17, 1940 Today's Date: 02/15/2012 Time: 1478-2956 PT Time Calculation (min): 38 min  PT Assessment / Plan / Recommendation Comments on Treatment Session  Good progress today. Limited by loose stool. Hopes to d/c home this weekend. Improved stability during gait with RW. Has one at home she can use.     Follow Up Recommendations  Home health PT;Supervision for mobility/OOB     Does the patient have the potential to tolerate intense rehabilitation     Barriers to Discharge        Equipment Recommendations  None recommended by PT    Recommendations for Other Services    Frequency Min 3X/week   Plan Discharge plan remains appropriate;Frequency remains appropriate    Precautions / Restrictions Precautions Precautions: Fall Restrictions Weight Bearing Restrictions: No   Pertinent Vitals/Pain vss    Mobility  Bed Mobility Bed Mobility: Not assessed Transfers Transfers: Sit to Stand;Stand to Sit Sit to Stand: 4: Min guard;With upper extremity assist;From chair/3-in-1;From bed Stand to Sit: 4: Min guard;With upper extremity assist;To chair/3-in-1;To bed Details for Transfer Assistance: cues for anterior translation of trunk, uses RW to stabilize in standing Ambulation/Gait Ambulation/Gait Assistance: 4: Min guard Ambulation Distance (Feet): 120 Feet Assistive device: Rolling walker Ambulation/Gait Assistance Details: cues for tall posture and safe positioning with RW as well as controlled breathing during gait Gait Pattern: Trunk flexed Gait velocity: slow gait speed to minimize dyspnea    Exercises General Exercises - Upper Extremity Shoulder Flexion: AROM;Both;10 reps;Seated (with focus on pursed lip breathing)    PT Goals Acute Rehab PT Goals PT Goal: Sit to Stand - Progress: Progressing toward goal PT Goal: Ambulate - Progress: Progressing toward goal PT Goal: Perform Home  Exercise Program - Progress: Progressing toward goal  Visit Information  Last PT Received On: 02/15/12 Assistance Needed: +1    Subjective Data  Subjective: I thought they told me I could lie down and rest.    Cognition  Overall Cognitive Status: Appears within functional limits for tasks assessed/performed Arousal/Alertness: Awake/alert Orientation Level: Appears intact for tasks assessed Behavior During Session: [facility] for tasks performed    Balance  Static Sitting Balance Static Sitting - Balance Support: Bilateral upper extremity supported Static Sitting - Level of Assistance: 5: Stand by assistance Static Sitting - Comment/# of Minutes: uses RW to stabilize Static Standing Balance Static Standing - Level of Assistance: 5: Stand by assistance Static Standing - Comment/# of Minutes: 3 minutes  End of Session PT - End of Session Equipment Utilized During Treatment: Gait belt Activity Tolerance: Patient tolerated treatment well Patient left: in bed;with call bell/phone within reach;with family/visitor present (sitting EOB)   GP     WHITLOW,Daphene Chisholm HELEN 02/15/2012, 3:06 PM

## 2012-02-15 NOTE — Progress Notes (Signed)
Subjective:  No chest pain. No additional GI bleed. Eager to go home.  Objective:  Vital Signs in the last 24 hours: Temp:  [97.7 F (36.5 C)-99.5 F (37.5 C)] 98.4 F (36.9 C) (11/22 0757) Pulse Rate:  [77-120] 95  (11/22 0757) Cardiac Rhythm:  [-] Atrial fibrillation (11/22 0757) Resp:  [16-18] 16  (11/22 0400) BP: (125-150)/(33-85) 150/69 mmHg (11/22 0757) SpO2:  [87 %-100 %] 100 % (11/22 0757)  Physical Exam: BP Readings from Last 1 Encounters:  02/15/12 150/69    Wt Readings from Last 1 Encounters:  02/10/12 102 kg (224 lb 13.9 oz)    Weight change:   HEENT: [place]/AT, Eyes-Blue, PERL, EOMI, Conjunctiva-Pale, Sclera-Non-icteric Neck: No JVD, No bruit, Trachea midline. Lungs:  Clear, Bilateral. Cardiac:  Regular rhythm, normal S1 and S2, no S3.  Abdomen:  Soft, non-tender. Extremities:  Trace edema present. No cyanosis. No clubbing. CNS: AxOx3, Cranial nerves grossly intact, moves all 4 extremities. Right handed. Skin: Warm and dry.   Intake/Output from previous day: 11/21 0701 - 11/22 0700 In: 1012 [P.O.:720; I.V.:130; IV Piggyback:162] Out: 2975 [Urine:2975]    Lab Results: BMET    Component Value Date/Time   NA 132* 02/15/2012 0430   K 4.2 02/15/2012 0430   CL 98 02/15/2012 0430   CO2 27 02/15/2012 0430   GLUCOSE 113* 02/15/2012 0430   BUN 26* 02/15/2012 0430   CREATININE 1.24* 02/15/2012 0430   CALCIUM 8.4 02/15/2012 0430   GFRNONAA 42* 02/15/2012 0430   GFRAA 49* 02/15/2012 0430   CBC    Component Value Date/Time   WBC 9.7 02/15/2012 0430   RBC 3.07* 02/15/2012 0430   HGB 9.1* 02/15/2012 0430   HCT 27.9* 02/15/2012 0430   PLT 72* 02/15/2012 0430   MCV 90.9 02/15/2012 0430   MCH 29.6 02/15/2012 0430   MCHC 32.6 02/15/2012 0430   RDW 18.9* 02/15/2012 0430   LYMPHSABS 0.9 02/03/2012 2155   MONOABS 1.0 02/03/2012 2155   EOSABS 0.0 02/03/2012 2155   BASOSABS 0.0 02/03/2012 2155   CARDIAC ENZYMES Lab Results  Component Value Date   CKTOTAL 31  02/05/2012   CKMB 3.5 02/05/2012   TROPONINI 1.03* 02/04/2012    Assessment/Plan:  Patient Active Hospital Problem List:  Status post acute upper GI bleed secondary to antral polyp status post polypectomy  Status post hypotensive shock secondary to above  Native mitral valve endocarditis  Enterococcus bacteremia  Status post Enterococcus UTI  Status post small non-Q-wave myocardial infarction  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

## 2012-02-15 NOTE — Progress Notes (Signed)
Occupational Therapy Treatment Patient Details Name: [name] MRN: [id number] DOB: 1940-08-15 Today's Date: 02/15/2012 Time: 5784-6962 OT Time Calculation (min): 21 min  OT Assessment / Plan / Recommendation Comments on Treatment Session Pt did initiate pursed lip breathing. Recently put on 02 all time time.     Follow Up Recommendations  Home health OT    Barriers to Discharge       Equipment Recommendations  None recommended by OT;None recommended by PT    Recommendations for Other Services    Frequency Min 2X/week   Plan      Precautions / Restrictions Precautions Precautions: Fall   Pertinent Vitals/Pain No c/o pain.  sats 91 - 96% on 3 liters.    ADL  Grooming: Performed;Brushing hair;Min guard (ambulating few steps to sink with min guard; no AD) Where Assessed - Grooming: Supported standing Transfers/Ambulation Related to ADLs: Pt fatiqued.  Had walked earlier.  Pt c/o bowel starting every time she gets up. Assisted to wipe:  pt with rash and very red.  Applied barrier cream.  Worked on reaching and retrieving at sink.  Pt will borrow a reacher.  Discussed ADL implications as well as retrieving items as pt does not feel safe reaching to floor.  Also explained use of long sponge for adls.  Reviewed energy conservation.  PT has a handout.      OT Diagnosis:    OT Problem List:   OT Treatment Interventions:     OT Goals ADL Goals Pt Will Perform Grooming: with modified independence;Standing at sink ADL Goal: Grooming - Progress: Progressing toward goals Miscellaneous OT Goals Miscellaneous OT Goal #1: Pt will utilize energy conservation principles with ADL activity with handout at mod I level OT Goal: Miscellaneous Goal #1 - Progress: Progressing toward goals  Visit Information  Last OT Received On: 02/15/12 Assistance Needed: +1    Subjective Data      Prior Functioning       Cognition  Overall Cognitive Status: Appears within functional limits for  tasks assessed/performed Arousal/Alertness: Awake/alert Orientation Level: Appears intact for tasks assessed Behavior During Session: [facility] for tasks performed    Mobility  Shoulder Instructions Transfers Sit to Stand: 4: Min guard       Exercises      Balance Static Standing Balance Static Standing - Level of Assistance: 5: Stand by assistance Static Standing - Comment/# of Minutes: 3 minutes   End of Session OT - End of Session Activity Tolerance: Patient limited by fatigue Patient left: in bed;with call bell/phone within reach Nurse Communication: Mobility status (rash/redness and c/o bowel stimulation with movement)  GO     Lashawn Orrego 02/15/2012, 12:18 PM Marica Otter, OTR/L [phone] 02/15/2012

## 2012-02-16 DIAGNOSIS — D62 Acute posthemorrhagic anemia: Secondary | ICD-10-CM

## 2012-02-16 DIAGNOSIS — D131 Benign neoplasm of stomach: Secondary | ICD-10-CM

## 2012-02-16 LAB — CBC
HCT: 27.7 % — ABNORMAL LOW (ref 36.0–46.0)
Hemoglobin: 8.9 g/dL — ABNORMAL LOW (ref 12.0–15.0)
MCV: 92.6 fL (ref 78.0–100.0)
Platelets: 64 10*3/uL — ABNORMAL LOW (ref 150–400)
RBC: 2.99 MIL/uL — ABNORMAL LOW (ref 3.87–5.11)
WBC: 6.8 10*3/uL (ref 4.0–10.5)

## 2012-02-16 LAB — PROTIME-INR: INR: 1.3 (ref 0.00–1.49)

## 2012-02-16 NOTE — Progress Notes (Signed)
Subjective:  No chest pain or fever. Stable Hgb.  Objective:  Vital Signs in the last 24 hours: Temp:  [97.5 F (36.4 C)-98.4 F (36.9 C)] 98.3 F (36.8 C) (11/23 0757) Pulse Rate:  [79-86] 82  (11/22 2102) Cardiac Rhythm:  [-] Atrial fibrillation (11/22 2000) Resp:  [20-22] 20  (11/23 0757) BP: (121-142)/(57-78) 135/73 mmHg (11/22 2344) SpO2:  [91 %-99 %] 99 % (11/23 0742)  Physical Exam: BP Readings from Last 1 Encounters:  02/15/12 135/73    Wt Readings from Last 1 Encounters:  02/10/12 102 kg (224 lb 13.9 oz)    Weight change:   HEENT: [place]/AT, Eyes-Blue, PERL, EOMI, Conjunctiva-Pale, Sclera-Non-icteric Neck: No JVD, No bruit, Trachea midline. Lungs:  Clear, Bilateral. Cardiac:  Regular rhythm, normal S1 and S2, no S3.  Abdomen:  Soft, non-tender. Extremities:  No edema present. No cyanosis. No clubbing. CNS: AxOx3, Cranial nerves grossly intact, moves all 4 extremities. Right handed. Skin: Warm and dry.   Intake/Output from previous day: 11/22 0701 - 11/23 0700 In: 1270 [P.O.:920; IV Piggyback:350] Out: -     Lab Results: BMET    Component Value Date/Time   NA 132* 02/15/2012 0430   K 4.2 02/15/2012 0430   CL 98 02/15/2012 0430   CO2 27 02/15/2012 0430   GLUCOSE 113* 02/15/2012 0430   BUN 26* 02/15/2012 0430   CREATININE 1.24* 02/15/2012 0430   CALCIUM 8.4 02/15/2012 0430   GFRNONAA 42* 02/15/2012 0430   GFRAA 49* 02/15/2012 0430   CBC    Component Value Date/Time   WBC 6.8 02/16/2012 0500   RBC 2.99* 02/16/2012 0500   HGB 8.9* 02/16/2012 0500   HCT 27.7* 02/16/2012 0500   PLT 64* 02/16/2012 0500   MCV 92.6 02/16/2012 0500   MCH 29.8 02/16/2012 0500   MCHC 32.1 02/16/2012 0500   RDW 19.2* 02/16/2012 0500   LYMPHSABS 0.9 02/03/2012 2155   MONOABS 1.0 02/03/2012 2155   EOSABS 0.0 02/03/2012 2155   BASOSABS 0.0 02/03/2012 2155   CARDIAC ENZYMES Lab Results  Component Value Date   CKTOTAL 31 02/05/2012   CKMB 3.5 02/05/2012   TROPONINI 1.03*  02/04/2012    Assessment/Plan:  Patient Active Hospital Problem List: Status post acute upper GI bleed secondary to antral polyp Status post polypectomy  Status post hypotensive shock secondary to above  Native mitral valve endocarditis  Enterococcus bacteremia  Status post Enterococcus UTI  Status post small non-Q-wave myocardial infarction  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 Home if ready for long term antibiotics   LOS: 13 days    Orpah Cobb  MD  02/16/2012, 8:36 AM

## 2012-02-16 NOTE — Progress Notes (Signed)
[facility] Progress Note (covering for Drs. Nicholes Mango)    Since last GI note: Eating well without nausea or vomiting or abd pains. No overt GI per patient or per covering RN.    Objective: Vital signs in last 24 hours: Temp:  [97.5 F (36.4 C)-98.4 F (36.9 C)] 98.3 F (36.8 C) (11/23 0757) Pulse Rate:  [79-86] 82  (11/22 2102) Resp:  [20-22] 20  (11/23 0757) BP: (121-142)/(57-78) 135/73 mmHg (11/22 2344) SpO2:  [91 %-99 %] 99 % (11/23 0742) Last BM Date: 02/15/12 General: alert and oriented times 3 Heart: regular rate and rythm Abdomen: soft, non-tender, non-distended, normal bowel sounds   Lab Results:  Basename 02/16/12 0500 02/15/12 0430 02/14/12 0430  WBC 6.8 9.7 16.9*  HGB 8.9* 9.1* 10.8*  PLT 64* 72* 108*  MCV 92.6 90.9 89.7    Basename 02/15/12 0430 02/14/12 0430  NA 132* 137  K 4.2 4.8  CL 98 101  CO2 27 24  GLUCOSE 113* 112*  BUN 26* 34*  CREATININE 1.24* 1.82*  CALCIUM 8.4 8.6   Basename 02/16/12 0500 02/15/12 0430  INR 1.30 1.39    Medications: Scheduled Meds:   . ampicillin (OMNIPEN) IV  2 g Intravenous Q8H  . antiseptic oral rinse  15 mL Mouth Rinse BID  . atorvastatin  40 mg Oral q1800  . budesonide-formoterol  2 puff Inhalation BID  . cefTRIAXone (ROCEPHIN)  IV  2 g Intravenous Q12H  . ferrous sulfate  325 mg Oral Q breakfast  . fluconazole  100 mg Oral Daily  . furosemide  40 mg Intravenous Daily  . hydroxychloroquine  200 mg Oral BID  . loperamide  2 mg Oral TID  . metoprolol  25 mg Oral TID  . pantoprazole  40 mg Oral BID AC  . potassium chloride  20 mEq Oral BID  . predniSONE  20 mg Oral Q breakfast  . rOPINIRole  1 mg Oral QHS  . sodium chloride  10-40 mL Intracatheter Q12H  . sodium chloride  3 mL Intravenous Q12H  . sulfaSALAzine  1,000 mg Oral BID   Continuous Infusions:   . sodium chloride 20 mL/hr at 02/11/12 0600  . [DISCONTINUED] sodium chloride     PRN Meds:.sodium chloride, acetaminophen,  nitroGLYCERIN, ondansetron (ZOFRAN) IV, ondansetron, oxyCODONE-acetaminophen, sodium chloride, sodium chloride, [DISCONTINUED] acetaminophen, [DISCONTINUED] ondansetron (ZOFRAN) IV    Assessment/Plan: 71 y.o. female with resolved GI bleeding  No sign of recurrent GI bleeding.  Hb stable essentially and no overt bleeding.  She had EGD with Dr. Elnoria Howard this past Wednesday and he recommended she avoid coumadin for a week and that is still the recommendations.  ASA is probably safe however.  She is safe for d/c from GI standpoint, follow up as needed with Dr. Elnoria Howard.   Rob Bunting, MD  02/16/2012, 8:02 AM [facility] Pager [phone]

## 2012-02-17 LAB — CBC
HCT: 29.7 % — ABNORMAL LOW (ref 36.0–46.0)
Hemoglobin: 9.7 g/dL — ABNORMAL LOW (ref 12.0–15.0)
MCH: 30.8 pg (ref 26.0–34.0)
MCHC: 32.7 g/dL (ref 30.0–36.0)
MCV: 94.3 fL (ref 78.0–100.0)

## 2012-02-17 LAB — PROTIME-INR: INR: 1.27 (ref 0.00–1.49)

## 2012-02-17 MED ORDER — DEXTROSE 5 % IV SOLN: 2.0000 g | INTRAVENOUS | Status: DC

## 2012-02-17 MED ORDER — FLUCONAZOLE 100 MG PO TABS: 100.0000 mg | ORAL_TABLET | Freq: Every day | ORAL | Status: DC

## 2012-02-17 MED ORDER — METOPROLOL TARTRATE 50 MG PO TABS: 25.0000 mg | ORAL_TABLET | Freq: Three times a day (TID) | ORAL | Status: DC

## 2012-02-17 MED ORDER — SODIUM CHLORIDE 0.9 % IV SOLN: 2.0000 g | Freq: Three times a day (TID) | INTRAVENOUS | Status: DC

## 2012-02-17 MED ORDER — HEPARIN SOD (PORK) LOCK FLUSH 100 UNIT/ML IV SOLN
250.0000 [IU] | INTRAVENOUS | Status: AC | PRN
  Administered 2012-02-17: 500 [IU]

## 2012-02-17 NOTE — Discharge Summary (Signed)
Physician Discharge Summary  Patient ID: GENOWEFA MORGA MRN: [id number] DOB/AGE: 73-Jul-1942 73 y.o.  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  History of rheumatoid arthritis  Coronary artery disease history of aortic stenosis status post CABG and bioprosthetic aVR  Hypertension  Hypercholesteremia  Chronic atrial fibrillation  History of pleuropericarditis in the past  History of GI bleeding in the past  Livedo reticularis    Discharge Diagnoses:  Principal Problem:  *NSTEMI (non-ST elevated myocardial infarction)* Status post acute upper GI bleed secondary to antral polyp  Status post polypectomy  Status post hypotensive shock secondary to above  Native mitral valve endocarditis  Enterococcus bacteremia  Status post Enterococcus UTI  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 She had enterococcal infection in urine and blood prompting TEE. It showed native Mitral valve endocarditis. IV Rocephin and ampicillin were given per infectious disease consult. She suffered acute GI bleed requiring emergent endoscopy by Dr. Jeani Hawking. Multiple  antral polyps, few with bleed were found and polypectomy as well as applying clip was carried out. Patient stabilized with holding all anticoagulants. On 02/17/2012 she was discharged home with home health RN and daughter providing IV antibiotic administration x additional 4 weeks.   Consults: ID and GI and Cardiology  Significant Diagnostic Studies: labs: WBC count improved from 13.5 K to 8.2 K.  and   TEE:  1. 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  2. Tricuspid valve severe regurgitation.  3. Moderate atherosclerosis.  4. Mobile thrombus in LA appendage  Upper Endoscopy: 1. Bleeding antral polyps, s/p resection. 2. Candida esophagitis 3. Small hiatal hernia and  4. Non obstructing Schatzki's ring  Treatments: IV hydration, antibiotics: ceftriaxone and Ampicillin and procedures: TEE and Upperendoscopy  Discharge Exam: Blood pressure 127/81, pulse 75, temperature 97.8 F (36.6 C), temperature source Oral, resp. rate 18, height 5\' 6"  (1.676 m), weight 103.7 kg (228 lb 9.9 oz), SpO2 95.00%.  HEENT: [place]/AT, Eyes-Blue, PERL, EOMI, Conjunctiva-Pale, Sclera-Non-icteric  Neck: No JVD, No bruit, Trachea midline.  Lungs: Clear, Bilateral. Minimal respiratory distress Cardiac: Regular rhythm, normal S1 and S2, no S3.  Abdomen: Soft, distended non-tender.  Extremities: No edema present. No cyanosis. No clubbing.  CNS: AxOx3, Cranial nerves grossly intact, moves all 4 extremities. Right handed.  Skin: Warm and dry. Large ecchymosis over right upper arm and chest area  Disposition: 01-Home or Self Care  Discharge Orders    Future Appointments: Provider: Department: Dept Phone: Center:   03/05/2012 2:00 PM Ginnie Smart, MD [facility] [phone] [facility]       Medication List     As of 02/17/2012 12:39 PM    STOP taking these medications         amiodarone 200 MG tablet  Commonly known as: PACERONE      BENICAR HCT 40-12.5 MG per tablet     Generic drug: olmesartan-hydrochlorothiazide      BENLYSTA IV      ELIQUIS 5 MG Tabs tablet   Generic drug: apixaban      TAKE these medications         budesonide-formoterol 160-4.5 MCG/ACT inhaler   Commonly known as: SYMBICORT   Inhale 2 puffs into the lungs 2 (two) times daily.      dextrose 5 % SOLN 50 mL with cefTRIAXone 2 G SOLR 2 g   Inject 2 g into the vein daily.      ferrous sulfate 325 (65 FE) MG tablet   Take 325 mg by mouth daily with breakfast.      fluconazole 100 MG tablet   Commonly known as: DIFLUCAN   Take 1 tablet (100 mg total) by mouth daily.      furosemide 40 MG tablet   Commonly known as: LASIX   Take 40 mg by mouth daily.      hydroxychloroquine 200 MG tablet   Commonly known as: PLAQUENIL   Take 200 mg by mouth 2 (two) times daily.      KLOR-CON M20 20 MEQ tablet   Generic drug: potassium chloride SA   Take 20 mEq by mouth daily.      metoprolol 50 MG tablet   Commonly known as: LOPRESSOR   Take 0.5 tablets (25 mg total) by mouth 3 (three) times daily.      ondansetron 4 MG tablet   Commonly known as: ZOFRAN   Take 1 tablet (4 mg total) by mouth 4 (four) times daily as needed for nausea.      pantoprazole 40 MG tablet   Commonly known as: PROTONIX   TAKE 1 TABLET BY MOUTH ONCE DAILY      predniSONE 5 MG tablet   Commonly known as: DELTASONE   Take 5 mg by mouth daily.      rOPINIRole 1 MG tablet   Commonly known as: REQUIP   Daily at bedtime      rosuvastatin 10 MG tablet   Commonly known as: CRESTOR   Take 10 mg by mouth daily.      sodium chloride 0.9 % SOLN 50 mL with ampicillin 2 G SOLR 2 g   Inject 2 g into the vein every 8 (eight) hours.      sulfaSALAzine 500 MG tablet   Commonly known as: AZULFIDINE   Take 1,000 mg by mouth 2 (two) times daily. 2 tablets twice daily           Follow-up Information    Follow up with [name], MD. Schedule an appointment as soon as possible for a visit in 1 week.    Contact information:   [address] [place] [address] [phone]          Signed: Ricki Rodriguez 02/17/2012, 12:39 PM

## 2012-02-17 NOTE — Progress Notes (Signed)
Pt was d/c'd to private vehicle with daughter and o2 tank on. Assessment unchanged from his am

## 2012-02-17 NOTE — Progress Notes (Signed)
   CARE MANAGEMENT NOTE 02/17/2012  Patient:  [name],[name]   Account Number:  [id number]  Date Initiated:  02/04/2012  Documentation initiated by:  Junius Creamer  Subjective/Objective Assessment:   adm w mi     Action/Plan:   lives alone, pcp dr Rinaldo Cloud   Anticipated DC Date:  02/17/2012   Anticipated DC Plan:  HOME W HOME HEALTH SERVICES      DC Planning Services  CM consult      PAC Choice  DURABLE MEDICAL EQUIPMENT  HOME HEALTH   Choice offered to / List presented to:  [name]        HH arranged  HH-1 RN  IV Antibiotics      HH agency  [facility]   Status of service:  Completed, signed off Medicare Important Message given?   (If response is "NO", the following Medicare IM given date fields will be blank) Date Medicare IM given:   Date Additional Medicare IM given:    Discharge Disposition:  HOME W HOME HEALTH SERVICES  Per UR Regulation:  Reviewed for med. necessity/level of care/duration of stay  If discussed at Long Length of Stay Meetings, dates discussed:   02/12/2012  02/14/2012    Comments:  02/17/2012 1745 NCM contacted dtr, [name] and made her aware in the change in [facility] agency. NCM explained [facility] will call with delivery time and administration of medication. Isidoro Donning RN CCM Case Mgmt phone [phone]  02/17/2012 1500 NCM contacted [facility] and they can set up with [facility] for IV abx today and schedule [facility] RN. Faxed orders, d/c summary, and F2F to [facility].  NCM spoke to [employer], on call [facility] and [place], pharmacy.  Explained [facility] can administer IV abx this evening. Pt was d/c home and would need doses tonight. Isidoro Donning RN CCM Case Mgmt phone [phone]  02/17/2012 1445 NCM spoke to pt and verified address. Pt d/c to dtr's home, [name] at [address], [phone]. Will need portable tank for home. NCM faxed Rx to [facility] for IV abx for evening dose at 10 pm. DME rep contacted for  portable tank for home. Received call from [facility] rep and they cannot schedule HH RN for tonight to administer IV abx. Isidoro Donning RN CCM Case Mgmt phone [phone]  02/17/2012 1300 NCM contacted [facility] rep and HH RN will be arranged for tonight IV abx. Email received from [place], weekday rep that pt scheduled for d/c home this weekend with IV abx. Isidoro Donning RN CCM Case Mgmt phone [phone]   11/14 14:05p debbie dowell rn,bsn spoke w pt and gave her hhc agency list. she plans to go to her daughter's at disch. she's aware she will need home iv antibiotics. she is [facility] w [facility] and will use them for hhc and iv antibotics. ref to [facility].  11/11 13:45p debbie dowell rn,bsn [phone]

## 2012-02-17 NOTE — Progress Notes (Signed)
Chaplain visited patient and family member during a regular routine rounds. Patient was recovering from a procedure done earlier. Patient and family were glad to notice an improvement in patient's health and look forward to be discharged soon. Chaplain listened empathetically, shared words of comfort and encouragement with both patient and family member. Chaplain also provided ministry of presence and will continue to provide spiritual care as needed. Patient and family member expressed appreciation for Chaplain's visit.

## 2012-03-05 ENCOUNTER — Ambulatory Visit (INDEPENDENT_AMBULATORY_CARE_PROVIDER_SITE_OTHER): Payer: Medicare Other | Admitting: Infectious Diseases

## 2012-03-05 ENCOUNTER — Encounter: Payer: Self-pay | Admitting: Infectious Diseases

## 2012-03-05 ENCOUNTER — Inpatient Hospital Stay: Payer: Medicare Other | Admitting: Infectious Diseases

## 2012-03-05 VITALS — BP 134/88 | HR 99 | Temp 97.6°F

## 2012-03-05 DIAGNOSIS — R21 Rash and other nonspecific skin eruption: Secondary | ICD-10-CM

## 2012-03-05 DIAGNOSIS — I38 Endocarditis, valve unspecified: Secondary | ICD-10-CM

## 2012-03-05 LAB — CBC WITH DIFFERENTIAL/PLATELET
Basophils Absolute: 0 10*3/uL (ref 0.0–0.1)
Basophils Relative: 1 % (ref 0–1)
Eosinophils Relative: 1 % (ref 0–5)
HCT: 35.7 % — ABNORMAL LOW (ref 36.0–46.0)
MCHC: 32.2 g/dL (ref 30.0–36.0)
MCV: 98.6 fL (ref 78.0–100.0)
Monocytes Absolute: 0.6 10*3/uL (ref 0.1–1.0)
RDW: 22.1 % — ABNORMAL HIGH (ref 11.5–15.5)

## 2012-03-05 NOTE — Addendum Note (Signed)
Addended by: Andree Coss on: 03/05/2012 04:28 PM   Modules accepted: Orders

## 2012-03-05 NOTE — Assessment & Plan Note (Signed)
See above

## 2012-03-05 NOTE — Progress Notes (Signed)
  Subjective:    Patient ID: [name], female    DOB: October 19, 1940, 71 y.o.   MRN: [id number]  HPI 71 F with history of CABG x 1 (LIMA to LAD) and bioprosthetic AVR in 2003. 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 She has significant SOB due to her pulmonary fibrosis. She had worsening SOB and wheezing in October and was treated for pneumonia with moxifloxicin and an increase in her prednisone dosage. She did have some improvement but then began to have worsening SOB again. She came to hospital 02-03-12 and was found to have NSTEMI, as well as enterococcal bacteremia (2/2 sens vanco/amp) as well as enterococcal UTI (R-levaqion). She underwent TEE and was found to have MV lesion, L atrial thrombus and TV valve dysfunction (severe TR). She was seen by ID on 02-06-12 and was changed to amp/gent. She received this for 3 days and due to renal dysfunction was changed to amp/ceftriaxone. She was d/c home on 11-24 with home ceftriaxone/ampicillin.  Has been feeling all right. Has had no problems with anbx per pt (perfamily- no energy, no appetite). No fever or chills. No problems with PIC site or function. Has developed scaling scabbing rash on her wrist and her belly button. Respiratory status is unchanged.   Review of Systems  Constitutional: Negative for fever and chills.  Respiratory: Positive for cough and shortness of breath.        Cough, sob at baseline.   Cardiovascular: Positive for leg swelling. Negative for chest pain.  Gastrointestinal: Negative for diarrhea and constipation.       Loose stool BID  Genitourinary: Negative for difficulty urinating.       Objective:   Physical Exam  Constitutional: She appears well-developed and well-nourished.  HENT:  Mouth/Throat: No oropharyngeal exudate.  Eyes: EOM are normal. Pupils are equal, round, and reactive to light.  Neck: Neck supple. JVD  present.  Cardiovascular: Normal rate and normal heart sounds.  An irregularly irregular rhythm present.  Pulmonary/Chest: Effort normal and breath sounds normal.  Abdominal: Soft. Bowel sounds are normal. There is no tenderness.  Musculoskeletal: She exhibits edema.  Lymphadenopathy:    She has no cervical adenopathy.  Skin: Rash noted.             Assessment & Plan:

## 2012-03-05 NOTE — Addendum Note (Signed)
Addended by: Andree Coss on: 03/05/2012 04:36 PM   Modules accepted: Orders

## 2012-03-05 NOTE — Assessment & Plan Note (Addendum)
She appears to be doing well with her endocarditis. I am concerned about comorbidities that may be affecting her at this point. Will check her BCx, CBC, CMP- would like to know that her edema is not from renal disease/drug reaction. Would also like to know that she does not have eosinophils, drug reaction causing her rash.  Check her doppler of RUE due to RUE swelling (while L is normal).  Ask wound to see her for LE compression stocking, possible unaboot. Her swelling is causing her skin to break down.  Defer to dr Sharyn Lull re: diuresis Plan to complete her anbx on the 24th of December as planned. Will see her back in 1 month to repeat her BCx, document clearance off anbx of her infection.

## 2012-03-05 NOTE — Addendum Note (Signed)
Addended by: Lurlean Leyden on: 03/05/2012 04:53 PM   Modules accepted: Orders

## 2012-03-06 ENCOUNTER — Telehealth: Payer: Self-pay | Admitting: *Deleted

## 2012-03-06 LAB — COMPLETE METABOLIC PANEL WITH GFR
AST: 19 U/L (ref 0–37)
Albumin: 3.1 g/dL — ABNORMAL LOW (ref 3.5–5.2)
Alkaline Phosphatase: 108 U/L (ref 39–117)
Chloride: 108 mEq/L (ref 96–112)
GFR, Est Non African American: 82 mL/min
Glucose, Bld: 121 mg/dL — ABNORMAL HIGH (ref 70–99)
Potassium: 4 mEq/L (ref 3.5–5.3)
Sodium: 141 mEq/L (ref 135–145)
Total Protein: 5.7 g/dL — ABNORMAL LOW (ref 6.0–8.3)

## 2012-03-06 NOTE — Telephone Encounter (Signed)
Faxed BMP & CBC results to Dr. Sharyn Lull, as he also requested these labs drawn.  Labs drawn and resulted here at patient's appointment. Andree Coss, RN

## 2012-03-07 ENCOUNTER — Inpatient Hospital Stay (HOSPITAL_COMMUNITY)
Admission: EM | Admit: 2012-03-07 | Discharge: 2012-03-11 | DRG: 299 | Disposition: A | Discharge status: Home / Self Care | Payer: Medicare Other | Attending: Cardiology | Admitting: Cardiology

## 2012-03-07 ENCOUNTER — Telehealth: Payer: Self-pay | Admitting: Infectious Diseases

## 2012-03-07 ENCOUNTER — Telehealth: Payer: Self-pay | Admitting: *Deleted

## 2012-03-07 ENCOUNTER — Encounter (HOSPITAL_COMMUNITY): Payer: Self-pay | Admitting: *Deleted

## 2012-03-07 ENCOUNTER — Ambulatory Visit (HOSPITAL_COMMUNITY)
Admission: RE | Admit: 2012-03-07 | Discharge: 2012-03-07 | Disposition: A | Discharge status: Home / Self Care | Payer: Medicare Other | Source: Ambulatory Visit | Attending: Infectious Diseases | Admitting: Infectious Diseases

## 2012-03-07 DIAGNOSIS — I10 Essential (primary) hypertension: Secondary | ICD-10-CM | POA: Diagnosis present

## 2012-03-07 DIAGNOSIS — M329 Systemic lupus erythematosus, unspecified: Secondary | ICD-10-CM | POA: Diagnosis present

## 2012-03-07 DIAGNOSIS — Z954 Presence of other heart-valve replacement: Secondary | ICD-10-CM

## 2012-03-07 DIAGNOSIS — I4891 Unspecified atrial fibrillation: Secondary | ICD-10-CM | POA: Diagnosis present

## 2012-03-07 DIAGNOSIS — I214 Non-ST elevation (NSTEMI) myocardial infarction: Secondary | ICD-10-CM | POA: Diagnosis present

## 2012-03-07 DIAGNOSIS — I2510 Atherosclerotic heart disease of native coronary artery without angina pectoris: Secondary | ICD-10-CM | POA: Diagnosis present

## 2012-03-07 DIAGNOSIS — J8410 Pulmonary fibrosis, unspecified: Secondary | ICD-10-CM | POA: Diagnosis present

## 2012-03-07 DIAGNOSIS — M7989 Other specified soft tissue disorders: Secondary | ICD-10-CM

## 2012-03-07 DIAGNOSIS — I330 Acute and subacute infective endocarditis: Secondary | ICD-10-CM | POA: Diagnosis present

## 2012-03-07 DIAGNOSIS — I38 Endocarditis, valve unspecified: Secondary | ICD-10-CM

## 2012-03-07 DIAGNOSIS — B952 Enterococcus as the cause of diseases classified elsewhere: Secondary | ICD-10-CM | POA: Diagnosis present

## 2012-03-07 DIAGNOSIS — D649 Anemia, unspecified: Secondary | ICD-10-CM | POA: Diagnosis present

## 2012-03-07 DIAGNOSIS — M069 Rheumatoid arthritis, unspecified: Secondary | ICD-10-CM | POA: Diagnosis present

## 2012-03-07 DIAGNOSIS — K219 Gastro-esophageal reflux disease without esophagitis: Secondary | ICD-10-CM | POA: Diagnosis present

## 2012-03-07 DIAGNOSIS — I82629 Acute embolism and thrombosis of deep veins of unspecified upper extremity: Principal | ICD-10-CM | POA: Diagnosis present

## 2012-03-07 DIAGNOSIS — Z951 Presence of aortocoronary bypass graft: Secondary | ICD-10-CM

## 2012-03-07 DIAGNOSIS — E876 Hypokalemia: Secondary | ICD-10-CM | POA: Diagnosis not present

## 2012-03-07 DIAGNOSIS — E785 Hyperlipidemia, unspecified: Secondary | ICD-10-CM | POA: Diagnosis present

## 2012-03-07 LAB — COMPREHENSIVE METABOLIC PANEL
Albumin: 2.4 g/dL — ABNORMAL LOW (ref 3.5–5.2)
BUN: 10 mg/dL (ref 6–23)
Calcium: 8.7 mg/dL (ref 8.4–10.5)
Creatinine, Ser: 0.8 mg/dL (ref 0.50–1.10)
Total Protein: 5.9 g/dL — ABNORMAL LOW (ref 6.0–8.3)

## 2012-03-07 LAB — CBC WITH DIFFERENTIAL/PLATELET
Eosinophils Relative: 1 % (ref 0–5)
HCT: 37.8 % (ref 36.0–46.0)
Lymphs Abs: 1.6 10*3/uL (ref 0.7–4.0)
MCV: 103.8 fL — ABNORMAL HIGH (ref 78.0–100.0)
Monocytes Relative: 18 % — ABNORMAL HIGH (ref 3–12)
Neutro Abs: 2.3 10*3/uL (ref 1.7–7.7)
Platelets: 88 10*3/uL — ABNORMAL LOW (ref 150–400)
RBC: 3.64 MIL/uL — ABNORMAL LOW (ref 3.87–5.11)
WBC: 4.7 10*3/uL (ref 4.0–10.5)

## 2012-03-07 LAB — PROTIME-INR
INR: 1.41 (ref 0.00–1.49)
Prothrombin Time: 16.9 seconds — ABNORMAL HIGH (ref 11.6–15.2)

## 2012-03-07 LAB — APTT: aPTT: 37 seconds (ref 24–37)

## 2012-03-07 NOTE — H&P (Signed)
[name] is an 71 y.o. female.   Chief Complaint: Right upper extremity swelling positive for DVT by duplex ultrasound/lower extremity swelling HPI: Patient is 71 year old Caucasian female with past medical history significant for multiple medical problems i.e. 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 Ninetta Lights today and hand duplex of upper extremity which showed DVT of right upper extremity. Patient was advised to come to the ER for further management. Patient denies any chest pain shortness of breath. States her breathing has improved after increasing Lasix dose but continues to have significant swelling and weeping from her legs. Denies any fever or chills. Denies any nausea or vomiting diaphoresis. Denies diarrhea states has been tolerating antibiotics okay. Patient will complete the course of 6 weeks of antibiotics in approximately 11 more days.  Past Medical History  Diagnosis Date  . Coronary artery disease   . Atrial fibrillation     Now NSR  . GERD (gastroesophageal reflux disease)   . Hyperlipidemia   . 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 Anemia   . Angina   . Shortness of breath     with activity  . Hypertension   . Blood transfusion    . Pneumonia   . H/O hiatal hernia   . Lupus     Past Surgical History  Procedure Date  . Cardiac catheterization   . Cardiac valve replacement     2006  . Coronary artery bypass graft   . Givens capsule study 04/09/2011    Procedure: GIVENS CAPSULE STUDY;  Surgeon: Theda Belfast, MD;  Location: [facility] ENDOSCOPY;  Service: Endoscopy;  Laterality: N/A;  . Tee without cardioversion 02/06/2012    Procedure: TRANSESOPHAGEAL ECHOCARDIOGRAM (TEE);  Surgeon: Ricki Rodriguez, MD;  Location: [facility] ENDOSCOPY;  Service: Cardiovascular;  Laterality: N/A;  . Esophagogastroduodenoscopy 02/12/2012    Procedure: ESOPHAGOGASTRODUODENOSCOPY (EGD);  Surgeon: Theda Belfast, MD;  Location: [facility] ENDOSCOPY;  Service: Endoscopy;  Laterality: N/A;    Family History  Problem Relation Age of Onset  . Heart disease Mother     CHF  . Mental illness Father     suicide  . Cancer Sister     pancreatic cancer  . COPD Brother    Social History:  reports that she has never smoked. She has never used smokeless tobacco. She reports that she does not drink alcohol. Her drug history not on file.  Allergies:  Allergies  Allergen Reactions  . Codeine Nausea Only     (Not in a hospital admission)  No results found for this or any previous visit (from the past 48 hour(s)). No results found.  Review of Systems  Constitutional: Negative for fever.  Eyes: Negative for blurred vision and double vision.  Respiratory: Negative for cough, hemoptysis, sputum production and shortness of breath.   Cardiovascular: Positive for leg swelling. Negative for chest pain, palpitations and  orthopnea.  Gastrointestinal: Negative for nausea, vomiting and abdominal pain.  Genitourinary: Negative for dysuria.  Neurological: Negative for dizziness and headaches.    Blood pressure 129/61, pulse 82, temperature 97.7 F (36.5 C), temperature source Oral, SpO2 94.00%. Physical Exam  Constitutional: She is oriented to person, place, and  time. She appears well-developed and well-nourished.  HENT:  Head: Normocephalic and atraumatic.  Nose: Nose normal.  Mouth/Throat: No oropharyngeal exudate.  Eyes: Conjunctivae normal are normal. Pupils are equal, round, and reactive to light. Left eye exhibits discharge. No scleral icterus.  Neck: Normal range of motion. Neck supple. No JVD present. No tracheal deviation present. No thyromegaly present.  Cardiovascular:       Irregularly irregular S1-S2 soft soft systolic murmur noted no S3 gallop  Respiratory: Effort normal and breath sounds normal. No respiratory distress. She has no wheezes. She has no rales.  GI: Soft. Bowel sounds are normal. She exhibits no distension. There is no tenderness. There is no rebound.  Musculoskeletal:       No clubbing cyanosis 4+ edema lower extremity and 1+ edema right upper extremity. Petechia noted in upper and lower extremities  Neurological: She is alert and oriented to person, place, and time.     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   [name] 03/07/2012, 10:48 PM

## 2012-03-07 NOTE — Telephone Encounter (Signed)
Patient's daughter called regarding her wound care referral, they can not see her until 04/01/11. I made a referral to [facility] which the patient was agreeable to and they will call her once they verify her insurance. Wendall Mola CMA

## 2012-03-07 NOTE — Telephone Encounter (Signed)
Received call from partner and spoke with CV lab. Pt has 2 DVTs in her arm. Spoke with pt's daughter- she will go to ED for: removal of PIC,  anticoagulation, new IV to complete 11 more days of therapy for her endocarditis. I also spoke with ED attending.  I am not sure that all of this could be accomplished over phone with pt as outpt.

## 2012-03-07 NOTE — ED Notes (Signed)
Dr. Sharyn Lull contacted via phone, orders received and initiated, pt moved to room, Dr. Sharyn Lull "will be here in 5 minutes".

## 2012-03-07 NOTE — Progress Notes (Signed)
*  Preliminary Results* Left upper extremity venous duplex completed. Left upper extremity is positive for acute deep vein thrombosis involving what appears to be a single brachial vein surrounding the PICC line, following the course of the brachial artery. The radial vein also demonstrates evidence of deep vein thrombosis. Preliminary results discussed with Dr.Hatcher and he has instructed the patient to report to the emergency department for treatment.  03/07/2012 4:36 PM Gertie Fey, RDMS, RDCS

## 2012-03-07 NOTE — ED Notes (Signed)
The pt waers 02 at home and her tank has emptied since she has been in the hospital all day getting tests

## 2012-03-07 NOTE — ED Notes (Addendum)
Family states picc line will be removed tomorrow due to blood clots around line. Pt alert and oriented. No signs of distress. Pt states her picc site is not painful. Pt able to move extremities with no problems. Pt plan is to be admitted and receive antibiotics

## 2012-03-07 NOTE — ED Notes (Signed)
The pt has a piocc line in her rt arm  From 3 weeks ago where she was admitted with endocarditis.  For the past 2-3 days she has had pain in her rt arm picc line.  She has a cooler rt hand than her lt hand.  She has had a doppler study she needs her picc line removed per dr Tyrone Apple note she has with her and a new picc placed for her iv antibiotics

## 2012-03-08 ENCOUNTER — Encounter (HOSPITAL_COMMUNITY): Payer: Self-pay | Admitting: *Deleted

## 2012-03-08 ENCOUNTER — Inpatient Hospital Stay (HOSPITAL_COMMUNITY): Payer: Medicare Other

## 2012-03-08 LAB — COMPREHENSIVE METABOLIC PANEL
ALT: 18 U/L (ref 0–35)
Alkaline Phosphatase: 125 U/L — ABNORMAL HIGH (ref 39–117)
BUN: 10 mg/dL (ref 6–23)
CO2: 25 mEq/L (ref 19–32)
Chloride: 106 mEq/L (ref 96–112)
GFR calc Af Amer: >90 mL/min (ref >90)
GFR calc non Af Amer: 81 mL/min — ABNORMAL LOW (ref >90)
Glucose, Bld: 95 mg/dL (ref 70–99)
Potassium: 3.3 mEq/L — ABNORMAL LOW (ref 3.5–5.1)
Sodium: 143 mEq/L (ref 135–145)
Total Bilirubin: 0.9 mg/dL (ref 0.3–1.2)
Total Protein: 6.3 g/dL (ref 6.0–8.3)

## 2012-03-08 LAB — CBC WITH DIFFERENTIAL/PLATELET
Basophils Absolute: 0.1 10*3/uL (ref 0.0–0.1)
Eosinophils Absolute: 0.1 10*3/uL (ref 0.0–0.7)
Lymphocytes Relative: 36 % (ref 12–46)
MCHC: 31.8 g/dL (ref 30.0–36.0)
Monocytes Relative: 14 % — ABNORMAL HIGH (ref 3–12)
Neutro Abs: 2.4 10*3/uL (ref 1.7–7.7)
Neutrophils Relative %: 48 % (ref 43–77)
Platelets: 96 10*3/uL — ABNORMAL LOW (ref 150–400)
RDW: 22.7 % — ABNORMAL HIGH (ref 11.5–15.5)
WBC: 5.2 10*3/uL (ref 4.0–10.5)

## 2012-03-08 LAB — PROTIME-INR
INR: 1.43 (ref 0.00–1.49)
Prothrombin Time: 17.1 seconds — ABNORMAL HIGH (ref 11.6–15.2)

## 2012-03-08 MED ORDER — SODIUM CHLORIDE 0.9 % IJ SOLN
10.0000 mL | Freq: Two times a day (BID) | INTRAMUSCULAR | Status: DC
  Administered 2012-03-08: 10 mL

## 2012-03-08 MED ORDER — SODIUM CHLORIDE 0.9 % IV SOLN
2.0000 g | Freq: Three times a day (TID) | INTRAVENOUS | Status: DC
  Administered 2012-03-08 – 2012-03-11 (×10): 2 g via INTRAVENOUS
  Filled 2012-03-08 (×12): qty 2000

## 2012-03-08 MED ORDER — CEFTRIAXONE SODIUM 2 G IJ SOLR
2.0000 g | Freq: Once | INTRAMUSCULAR | Status: AC
  Administered 2012-03-08: 2 g via INTRAVENOUS
  Filled 2012-03-08: qty 2

## 2012-03-08 MED ORDER — DEXTROSE 5 % IV SOLN
2.0000 g | INTRAVENOUS | Status: DC
  Administered 2012-03-08 – 2012-03-10 (×3): 2 g via INTRAVENOUS
  Filled 2012-03-08 (×5): qty 2

## 2012-03-08 MED ORDER — WARFARIN SODIUM 5 MG PO TABS
5.0000 mg | ORAL_TABLET | Freq: Once | ORAL | Status: AC
  Administered 2012-03-08: 5 mg via ORAL
  Filled 2012-03-08: qty 1

## 2012-03-08 MED ORDER — ROPINIROLE HCL 1 MG PO TABS
1.0000 mg | ORAL_TABLET | ORAL | Status: AC
  Administered 2012-03-08: 1 mg via ORAL
  Filled 2012-03-08: qty 1

## 2012-03-08 MED ORDER — WARFARIN - PHARMACIST DOSING INPATIENT: Freq: Every day | Status: DC

## 2012-03-08 MED ORDER — METOPROLOL TARTRATE 50 MG PO TABS
50.0000 mg | ORAL_TABLET | Freq: Two times a day (BID) | ORAL | Status: DC
  Administered 2012-03-08 – 2012-03-10 (×5): 50 mg via ORAL
  Filled 2012-03-08 (×7): qty 1

## 2012-03-08 MED ORDER — POTASSIUM CHLORIDE CRYS ER 20 MEQ PO TBCR
20.0000 meq | EXTENDED_RELEASE_TABLET | Freq: Three times a day (TID) | ORAL | Status: DC
  Administered 2012-03-08 – 2012-03-09 (×3): 20 meq via ORAL
  Filled 2012-03-08 (×4): qty 1

## 2012-03-08 MED ORDER — PREDNISONE 5 MG PO TABS
5.0000 mg | ORAL_TABLET | Freq: Every day | ORAL | Status: DC
  Administered 2012-03-08 – 2012-03-11 (×4): 5 mg via ORAL
  Filled 2012-03-08 (×5): qty 1

## 2012-03-08 MED ORDER — ENOXAPARIN SODIUM 120 MG/0.8ML ~~LOC~~ SOLN
1.0000 mg/kg | Freq: Two times a day (BID) | SUBCUTANEOUS | Status: DC
  Administered 2012-03-08 (×2): 105 mg via SUBCUTANEOUS
  Filled 2012-03-08 (×4): qty 0.8

## 2012-03-08 MED ORDER — SULFASALAZINE 500 MG PO TBEC
1000.0000 mg | DELAYED_RELEASE_TABLET | ORAL | Status: AC
  Administered 2012-03-08: 1000 mg via ORAL
  Filled 2012-03-08: qty 2

## 2012-03-08 MED ORDER — PANTOPRAZOLE SODIUM 40 MG PO TBEC
40.0000 mg | DELAYED_RELEASE_TABLET | Freq: Two times a day (BID) | ORAL | Status: DC
  Administered 2012-03-08 – 2012-03-11 (×8): 40 mg via ORAL
  Filled 2012-03-08 (×8): qty 1

## 2012-03-08 MED ORDER — SODIUM CHLORIDE 0.9 % IJ SOLN
10.0000 mL | INTRAMUSCULAR | Status: DC | PRN
  Administered 2012-03-11 (×2): 10 mL

## 2012-03-08 MED ORDER — FERROUS SULFATE 325 (65 FE) MG PO TABS
325.0000 mg | ORAL_TABLET | Freq: Every day | ORAL | Status: DC
  Administered 2012-03-08 – 2012-03-11 (×4): 325 mg via ORAL
  Filled 2012-03-08 (×5): qty 1

## 2012-03-08 MED ORDER — WARFARIN VIDEO
1.0000 | Freq: Once | Status: AC
  Administered 2012-03-08: 1

## 2012-03-08 MED ORDER — ROPINIROLE HCL 1 MG PO TABS
1.0000 mg | ORAL_TABLET | Freq: Every day | ORAL | Status: DC
  Administered 2012-03-08 – 2012-03-10 (×3): 1 mg via ORAL
  Filled 2012-03-08 (×5): qty 1

## 2012-03-08 MED ORDER — COUMADIN BOOK
1.0000 | Freq: Once | Status: AC
  Administered 2012-03-08: 1
  Filled 2012-03-08: qty 1

## 2012-03-08 MED ORDER — SODIUM CHLORIDE 0.9 % IV SOLN
INTRAVENOUS | Status: DC
  Administered 2012-03-08: 10 mL/h via INTRAVENOUS

## 2012-03-08 MED ORDER — HYDROXYCHLOROQUINE SULFATE 200 MG PO TABS
200.0000 mg | ORAL_TABLET | Freq: Two times a day (BID) | ORAL | Status: DC
  Administered 2012-03-08 – 2012-03-10 (×7): 200 mg via ORAL
  Filled 2012-03-08 (×9): qty 1

## 2012-03-08 MED ORDER — BUDESONIDE-FORMOTEROL FUMARATE 160-4.5 MCG/ACT IN AERO
2.0000 | INHALATION_SPRAY | Freq: Two times a day (BID) | RESPIRATORY_TRACT | Status: DC
  Filled 2012-03-08: qty 6

## 2012-03-08 MED ORDER — POTASSIUM CHLORIDE CRYS ER 20 MEQ PO TBCR
20.0000 meq | EXTENDED_RELEASE_TABLET | Freq: Two times a day (BID) | ORAL | Status: DC
  Administered 2012-03-08 (×2): 20 meq via ORAL
  Filled 2012-03-08 (×3): qty 1

## 2012-03-08 MED ORDER — FUROSEMIDE 10 MG/ML IJ SOLN
40.0000 mg | Freq: Two times a day (BID) | INTRAMUSCULAR | Status: DC
  Administered 2012-03-08 – 2012-03-09 (×4): 40 mg via INTRAVENOUS
  Filled 2012-03-08 (×6): qty 4

## 2012-03-08 MED ORDER — BUDESONIDE-FORMOTEROL FUMARATE 160-4.5 MCG/ACT IN AERO
2.0000 | INHALATION_SPRAY | Freq: Two times a day (BID) | RESPIRATORY_TRACT | Status: DC
  Administered 2012-03-08 – 2012-03-11 (×7): 2 via RESPIRATORY_TRACT

## 2012-03-08 MED ORDER — ENOXAPARIN SODIUM 120 MG/0.8ML ~~LOC~~ SOLN
105.0000 mg | Freq: Once | SUBCUTANEOUS | Status: AC
  Administered 2012-03-08: 105 mg via SUBCUTANEOUS
  Filled 2012-03-08: qty 0.8

## 2012-03-08 MED ORDER — SULFASALAZINE 500 MG PO TABS
1000.0000 mg | ORAL_TABLET | Freq: Two times a day (BID) | ORAL | Status: DC
  Administered 2012-03-08 – 2012-03-10 (×6): 1000 mg via ORAL
  Filled 2012-03-08 (×8): qty 2

## 2012-03-08 MED ORDER — ATORVASTATIN CALCIUM 20 MG PO TABS
20.0000 mg | ORAL_TABLET | Freq: Every day | ORAL | Status: DC
  Administered 2012-03-08 – 2012-03-10 (×3): 20 mg via ORAL
  Filled 2012-03-08 (×4): qty 1

## 2012-03-08 MED ORDER — METOPROLOL TARTRATE 25 MG PO TABS
25.0000 mg | ORAL_TABLET | Freq: Three times a day (TID) | ORAL | Status: DC
  Administered 2012-03-08: 25 mg via ORAL
  Filled 2012-03-08 (×4): qty 1

## 2012-03-08 NOTE — ED Notes (Signed)
Pt transported to the floor with tech on monitor

## 2012-03-08 NOTE — Progress Notes (Signed)
Subjective:  Patient denies any chest pain or shortness of breath. PICC line is being placed in left upper arm. Remains with A. fib with moderate ventricular response with occasional RVR  Objective:  Vital Signs in the last 24 hours: Temp:  [97.6 F (36.4 C)-98.3 F (36.8 C)] 98.1 F (36.7 C) (12/14 0406) Pulse Rate:  [79-126] 126  (12/14 0643) Resp:  [19-25] 20  (12/14 0406) BP: (117-154)/(61-100) 117/73 mmHg (12/14 0643) SpO2:  [92 %-99 %] 92 % (12/14 0911) FiO2 (%):  [2 %] 2 % (12/14 0135) Weight:  [103.7 kg (228 lb 9.9 oz)-104.01 kg (229 lb 4.8 oz)] 104.01 kg (229 lb 4.8 oz) (12/14 0135)  Intake/Output from previous day: 12/13 0701 - 12/14 0700 In: -  Out: 550 [Urine:550] Intake/Output from this shift:    Physical Exam: Neck: no adenopathy, no carotid bruit, no JVD and supple, symmetrical, trachea midline Lungs: clear to auscultation bilaterally Heart: irregularly irregular rhythm, S1, S2 normal and Soft systolic murmur noted Abdomen: soft, non-tender; bowel sounds normal; no masses,  no organomegaly Extremities: No clubbing cyanosis 3+ edema lower extremities noted  Lab Results:  [facility] 03/08/12 0133 03/07/12 2301  WBC 5.2 4.7  HGB 12.3 11.7*  PLT 96* 88*    Basename 03/08/12 0133 03/07/12 2301  NA 143 142  K 3.3* 3.5  CL 106 107  CO2 25 25  GLUCOSE 95 107*  BUN 10 10  CREATININE 0.79 0.80   No results found for this basename: TROPONINI:2,CK,MB:2 in the last 72 hours Hepatic Function Panel  Basename 03/08/12 0133  PROT 6.3  ALBUMIN 2.6*  AST 25  ALT 18  ALKPHOS 125*  BILITOT 0.9  BILIDIR --  IBILI --   No results found for this basename: CHOL in the last 72 hours No results found for this basename: PROTIME in the last 72 hours  Imaging: Imaging results have been reviewed and No results found.  Cardiac Studies:  Assessment/Plan:  Right upper extremity DVT  Resolving native mitral wall endocarditis  Status post recent small non-Q-wave  myocardial infarction  Chronic atrial fibrillation with left atrial thrombus  History of recent upper GI bleed secondary to antral polyp status post polypectomy doing well with no further evidence of bleeding  Status post recent enterococcus UTI/bacteremia  Mixed connective tissue disease with pulmonary fibrosis  History of rheumatoid arthritis  Coronary artery disease/history of aortic stenosis status post CABG and bioprosthetic aortic valve  Hypertension  Hypercholesteremia  History of pleuropericarditis in remote past  History of GI bleed and also remote past  Status post acute renal insufficiency  Anemia Hypokalemia Plan As per orders  LOS: 1 day    [name] 03/08/2012, 11:47 AM

## 2012-03-08 NOTE — Progress Notes (Addendum)
ANTICOAGULATION CONSULT NOTE - Initial Consult  Pharmacy Consult for Lovenox and Coumadin Indication: Upper extremity DVT, h/o atrial fibrillation   Allergies  Allergen Reactions  . Codeine Nausea Only    Patient Measurements: Height: 5' 6.14" (168 cm) Weight: 228 lb 9.9 oz (103.7 kg) (Per 02/17/12 documentation) IBW/kg (Calculated) : 59.63   Vital Signs: Temp: 98.3 F (36.8 C) (12/13 2308) Temp src: Oral (12/13 2308) BP: 146/91 mmHg (12/14 0015) Pulse Rate: 79  (12/14 0015)  Labs:  Basename 03/07/12 2301 03/05/12 1652  HGB 11.7* 11.5*  HCT 37.8 35.7*  PLT 88* 99*  APTT 37 --  LABPROT 16.9* --  INR 1.41 --  HEPARINUNFRC -- --  CREATININE 0.80 0.73  CKTOTAL -- --  CKMB -- --  TROPONINI -- --    Estimated Creatinine Clearance: 76.3 ml/min (by C-G formula based on Cr of 0.8).   Medical History: Past Medical History  Diagnosis Date  . Coronary artery disease   . Atrial fibrillation     Now NSR  . GERD (gastroesophageal reflux disease)   . Hyperlipidemia   . 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 Anemia   . Angina   . Shortness of breath     with activity  . Hypertension   . Blood transfusion   . Pneumonia   . H/O hiatal hernia   . Lupus     Medications:  Scheduled:    . rOPINIRole  1 mg Oral QHS  . [COMPLETED] rOPINIRole  1 mg Oral To ER  . [COMPLETED] sulfaSALAzine  1,000 mg Oral To ER  . sulfaSALAzine  1,000 mg Oral BID    Assessment: 71 yo female with recent admit (11/10-11/24) for NSTEMI, enterococcaal endocarditis, and GIB 2/2 antral polyp s/p polypectomy. Patient with h/o atrial fibrillation with left atrial thrombus previously on apixaban (prior to 11/10 hospitalization). Since discharge, patient with PICC line receiving antibiotics for endocarditis (6 week course to be completed 12/24). Confirmed with patient and family that since last discharge (11/24) she has not been on any blood thinners, including apixaban.    Patient now with upper extremity DVT. Pharmacy to manage Coumadin and Lovenox. Noted baseline INR 1.41 and platelets of 88.   Goal of Therapy:  INR 2-3 Monitor platelets by anticoagulation protocol: Yes   Plan:  1. Lovenox 105mg  SQ Q12H. 2. Coumadin 5mg  po x 1.  3. Daily PT / INR 4. Coumadin book / video. 5. Coumadin education with pharmacist.  6. CBC Q72H while on Lovenox.    Emeline Gins 03/08/2012,12:44 AM

## 2012-03-08 NOTE — Progress Notes (Signed)
ANTICOAGULATION CONSULT NOTE - Follow Up Consult  Pharmacy Consult for coumadin/lovenox Indication: DVT, h/o afib   Allergies  Allergen Reactions  . Codeine Nausea Only    Patient Measurements: Height: 5' 6.14" (168 cm) Weight: 229 lb 4.8 oz (104.01 kg) IBW/kg (Calculated) : 59.63   Vital Signs: Temp: 98.1 F (36.7 C) (12/14 0406) Temp src: Oral (12/14 0406) BP: 117/73 mmHg (12/14 0643) Pulse Rate: 126  (12/14 0643)  Labs:  Basename 03/08/12 0133 03/07/12 2301 03/05/12 1652  HGB 12.3 11.7* --  HCT 38.7 37.8 35.7*  PLT 96* 88* 99*  APTT 33 37 --  LABPROT 17.1* 16.9* --  INR 1.43 1.41 --  HEPARINUNFRC -- -- --  CREATININE 0.79 0.80 0.73  CKTOTAL -- -- --  CKMB -- -- --  TROPONINI -- -- --    Estimated Creatinine Clearance: 76.5 ml/min (by C-G formula based on Cr of 0.79).   Assessment: 71 yo female with recent admit (11/10-11/24) for NSTEMI, enterococcaal endocarditis, and GIB 2/2 antral polyp s/p polypectomy. Patient with h/o atrial fibrillation with left atrial thrombus previously on apixaban (prior to 11/10 hospitalization). Since discharge, patient with PICC line receiving antibiotics for endocarditis (6 week course to be completed 12/24). She has not been on any blood thinners, including apixaban since d/c from hospital. Now with upper extremity DVT. Pharmacy managing Coumadin and Lovenox. INR 1.43. Her cbc is stable from prior admit and plts are low but stable as well. Renal function remains adequate for bid lovenox dosing.   Goal of Therapy:  INR 2-3 Monitor platelets by anticoagulation protocol: Yes   Plan:  Continue lovenox 1mg /kg BID Coumadin 5mg  po x 1 dose today  F/u inr in the AM  Thank you,  Brett Fairy, PharmD, BCPS 03/08/2012 11:10 AM

## 2012-03-09 LAB — BASIC METABOLIC PANEL
BUN: 9 mg/dL (ref 6–23)
Chloride: 103 mEq/L (ref 96–112)
Creatinine, Ser: 0.88 mg/dL (ref 0.50–1.10)
GFR calc non Af Amer: 64 mL/min — ABNORMAL LOW (ref >90)
Glucose, Bld: 86 mg/dL (ref 70–99)
Potassium: 3.6 mEq/L (ref 3.5–5.1)

## 2012-03-09 LAB — CBC
HCT: 33.9 % — ABNORMAL LOW (ref 36.0–46.0)
Hemoglobin: 10.5 g/dL — ABNORMAL LOW (ref 12.0–15.0)
MCHC: 31 g/dL (ref 30.0–36.0)
MCV: 101.8 fL — ABNORMAL HIGH (ref 78.0–100.0)
RDW: 22.5 % — ABNORMAL HIGH (ref 11.5–15.5)

## 2012-03-09 LAB — PROTIME-INR: INR: 1.78 — ABNORMAL HIGH (ref 0.00–1.49)

## 2012-03-09 MED ORDER — FUROSEMIDE 10 MG/ML IJ SOLN
80.0000 mg | Freq: Two times a day (BID) | INTRAMUSCULAR | Status: DC
  Administered 2012-03-09 – 2012-03-11 (×4): 80 mg via INTRAVENOUS
  Filled 2012-03-09: qty 8
  Filled 2012-03-09: qty 4
  Filled 2012-03-09: qty 8
  Filled 2012-03-09: qty 4
  Filled 2012-03-09 (×2): qty 8

## 2012-03-09 MED ORDER — WARFARIN SODIUM 5 MG PO TABS
5.0000 mg | ORAL_TABLET | Freq: Once | ORAL | Status: AC
  Administered 2012-03-09: 5 mg via ORAL
  Filled 2012-03-09: qty 1

## 2012-03-09 MED ORDER — LOPERAMIDE HCL 2 MG PO CAPS: 2.0000 mg | ORAL_CAPSULE | Freq: Every day | ORAL | Status: DC

## 2012-03-09 MED ORDER — LOPERAMIDE HCL 2 MG PO CAPS
2.0000 mg | ORAL_CAPSULE | Freq: Every day | ORAL | Status: DC | PRN
  Administered 2012-03-09: 2 mg via ORAL
  Filled 2012-03-09 (×2): qty 1

## 2012-03-09 MED ORDER — POTASSIUM CHLORIDE CRYS ER 20 MEQ PO TBCR
40.0000 meq | EXTENDED_RELEASE_TABLET | Freq: Two times a day (BID) | ORAL | Status: DC
  Administered 2012-03-09 – 2012-03-10 (×3): 40 meq via ORAL
  Filled 2012-03-09 (×4): qty 2

## 2012-03-09 NOTE — Progress Notes (Signed)
ANTICOAGULATION CONSULT NOTE - Follow Up Consult  Pharmacy Consult for coumadin/lovenox Indication: DVT, h/o afib   Allergies  Allergen Reactions  . Codeine Nausea Only    Patient Measurements: Height: 5' 6.14" (168 cm) Weight: 229 lb 4.8 oz (104.01 kg) IBW/kg (Calculated) : 59.63   Vital Signs: Temp: 97.4 F (36.3 C) (12/15 0523) Temp src: Oral (12/15 0523) BP: 137/84 mmHg (12/15 0523) Pulse Rate: 107  (12/15 0523)  Labs:  Basename 03/09/12 0529 03/08/12 0133 03/07/12 2301  HGB 10.5* 12.3 --  HCT 33.9* 38.7 37.8  PLT 92* 96* 88*  APTT -- 33 37  LABPROT 20.1* 17.1* 16.9*  INR 1.78* 1.43 1.41  HEPARINUNFRC -- -- --  CREATININE 0.88 0.79 0.80  CKTOTAL -- -- --  CKMB -- -- --  TROPONINI -- -- --    Estimated Creatinine Clearance: 69.6 ml/min (by C-G formula based on Cr of 0.88).   Assessment: 71 yo female with recent admit (11/10-11/24) for NSTEMI, enterococcaal endocarditis, and GIB 2/2 antral polyp s/p polypectomy. Patient with h/o atrial fibrillation with left atrial thrombus previously on apixaban (prior to 11/10 hospitalization). Since discharge, patient with PICC line receiving antibiotics for endocarditis (6 week course to be completed 12/24). She has not been on any blood thinners, including apixaban since d/c from hospital. Now with upper extremity DVT. Pharmacy managing Coumadin. Lovenox d/c due to nose bleed. INR up to 1.78 after 2 doses of coumadin. Her hgb decreased from 12 to 10.5. Plts were low at baseline and are stable.   Goal of Therapy:  INR 2-3 Monitor platelets by anticoagulation protocol: Yes   Plan:  Coumadin 5mg  po x 1 dose today  F/u inr in the AM  Thank you,  Brett Fairy, PharmD, BCPS 03/09/2012 10:45 AM

## 2012-03-09 NOTE — Progress Notes (Signed)
Subjective:  Patient denies any chest pain or shortness of breath states overall feels better. Had epistaxis and some bright red blood per rectum early this morning no further bleeding.  Objective:  Vital Signs in the last 24 hours: Temp:  [97.4 F (36.3 C)-98.6 F (37 C)] 97.4 F (36.3 C) (12/15 0523) Pulse Rate:  [102-107] 107  (12/15 0523) Resp:  [18-20] 18  (12/15 0523) BP: (125-156)/(81-85) 137/84 mmHg (12/15 0523) SpO2:  [95 %-98 %] 98 % (12/15 0823)  Intake/Output from previous day: 12/14 0701 - 12/15 0700 In: 340 [I.V.:240; IV Piggyback:100] Out: 651 [Urine:650; Stool:1] Intake/Output from this shift:    Physical Exam: Neck: no adenopathy, no carotid bruit, no JVD and supple, symmetrical, trachea midline Lungs: clear to auscultation bilaterally Heart: irregularly irregular rhythm, S1, S2 normal and Soft systolic murmur noted Abdomen: soft, non-tender; bowel sounds normal; no masses,  no organomegaly Extremities: No clubbing cyanosis 3+ edema noted. Petechia stable  Lab Results:  Basename 03/09/12 0529 03/08/12 0133  WBC 4.4 5.2  HGB 10.5* 12.3  PLT 92* 96*    Basename 03/09/12 0529 03/08/12 0133  NA 141 143  K 3.6 3.3*  CL 103 106  CO2 28 25  GLUCOSE 86 95  BUN 9 10  CREATININE 0.88 0.79   No results found for this basename: TROPONINI:2,CK,MB:2 in the last 72 hours Hepatic Function Panel  Basename 03/08/12 0133  PROT 6.3  ALBUMIN 2.6*  AST 25  ALT 18  ALKPHOS 125*  BILITOT 0.9  BILIDIR --  IBILI --   No results found for this basename: CHOL in the last 72 hours No results found for this basename: PROTIME in the last 72 hours  Imaging: Imaging results have been reviewed and Dg Chest Port 1 View  03/08/2012  *RADIOLOGY REPORT*  Clinical Data: Central line placement.  PORTABLE CHEST - 1 VIEW  Comparison: 02/07/2012  Findings: Left arm PICC line is seen with tip in the proximal right atrium, approximately 3 cm below the expected level of the  superior cavoatrial junction.  Right arm PICC line remains in appropriate position.  Cardiomegaly stable.  Low lung volumes and pulmonary vascular congestion show no significant change.  No evidence of pleural effusion.  IMPRESSION:  New left arm PICC line tip is in the proximal right atrium, approximately 3 cm below expected level of superior cavoatrial junction.   Original Report Authenticated By: Myles Rosenthal, M.D.     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  LOS: 2 days    Lauralye Kinn N 03/09/2012, 11:16 AM

## 2012-03-09 NOTE — Progress Notes (Addendum)
Patient had a nose bleed this morning. Patient also has bright red blood in her stool. Paged Dr. Sharyn Lull to notify him. Dr. Sharyn Lull ordered to discontinue Lovenox. Lajuana Matte, RN

## 2012-03-10 LAB — CBC
HCT: 33.7 % — ABNORMAL LOW (ref 36.0–46.0)
MCH: 31.7 pg (ref 26.0–34.0)
MCV: 101.8 fL — ABNORMAL HIGH (ref 78.0–100.0)
RBC: 3.31 MIL/uL — ABNORMAL LOW (ref 3.87–5.11)
WBC: 4.7 10*3/uL (ref 4.0–10.5)

## 2012-03-10 LAB — BASIC METABOLIC PANEL
BUN: 10 mg/dL (ref 6–23)
CO2: 29 mEq/L (ref 19–32)
Calcium: 8.7 mg/dL (ref 8.4–10.5)
Chloride: 100 mEq/L (ref 96–112)
Creatinine, Ser: 0.92 mg/dL (ref 0.50–1.10)

## 2012-03-10 MED ORDER — METOLAZONE 2.5 MG PO TABS
2.5000 mg | ORAL_TABLET | Freq: Every day | ORAL | Status: DC
  Administered 2012-03-10: 2.5 mg via ORAL
  Filled 2012-03-10 (×2): qty 1

## 2012-03-10 MED ORDER — WARFARIN SODIUM 2.5 MG PO TABS
2.5000 mg | ORAL_TABLET | Freq: Once | ORAL | Status: AC
  Administered 2012-03-10: 2.5 mg via ORAL
  Filled 2012-03-10: qty 1

## 2012-03-10 NOTE — Care Management Note (Signed)
    Page 1 of 2   03/11/2012     11:19:23 AM   CARE MANAGEMENT NOTE 03/11/2012  Patient:  [name],[name]   Account Number:  [id number]  Date Initiated:  03/10/2012  Documentation initiated by:  [facility]  Subjective/Objective Assessment:   DVT  Lives alone - has had [facility] with trinity infusions.     Action/Plan:   Anticipated DC Date:  03/11/2012   Anticipated DC Plan:  HOME W HOME HEALTH SERVICES      DC Planning Services  CM consult      [facility] Choice  Resumption Of Svcs/PTA Provider   Choice offered to / List presented to:          [facility] arranged  HH-1 RN      [facility] agency  [facility]   Status of service:  Completed, signed off Medicare Important Message given?   (If response is "NO", the following Medicare IM given date fields will be blank) Date Medicare IM given:   Date Additional Medicare IM given:    Discharge Disposition:  HOME W HOME HEALTH SERVICES  Per UR Regulation:  Reviewed for med. necessity/level of care/duration of stay  If discussed at Long Length of Stay Meetings, dates discussed:    Comments:  Contact:  Grasse,Robin Daughter [phone]                 Robinson,Stephanie Daughter [phone]                 Lucianne Muss Daughter [phone]  03/11/12 Rosalita Chessman [phone] PT FOR DC HOME TODAY.  PTA, PT ACTIVE WITH [facility] FOR IV ANTIBIOTIC INFUSIONS VIA PICC LINE.  MET WITH PT AND DAUGHTER TO FINALIZE DC PLANS.  PT/DTR STATE THEY HAVE IV ABX AT HOME.  WILL NOTIFY [facility] OF DC TODAY TO RESUME SERVICES.  PT HAS HOME OXYGEN THROUGH [facility], AND STATES PORTABLE O2 TANK IS EMPTY.  WILL NOTIFY [facility] DME REP FOR REPLACEMENT TANK PRIOR TO DC, PER REQUEST.  SPOKE WITH MARY MANLEY OF [facility] TO NOTIFY OF TODAY'S DC.

## 2012-03-10 NOTE — Progress Notes (Signed)
Subjective:  Patient denies any chest pain complains of shortness of breath with minimal exertion states leg swelling is slowly going down. No further episodes of epistaxis or lower GI bleed  Objective:  Vital Signs in the last 24 hours: Temp:  [97.8 F (36.6 C)-98 F (36.7 C)] 98 F (36.7 C) (12/16 0656) Pulse Rate:  [85-111] 95  (12/16 0656) Resp:  [19-20] 20  (12/16 0656) BP: (127-149)/(74-98) 128/74 mmHg (12/16 0656) SpO2:  [92 %-100 %] 92 % (12/16 0834)  Intake/Output from previous day: 12/15 0701 - 12/16 0700 In: 100 [IV Piggyback:100] Out: 600 [Urine:600] Intake/Output from this shift: Total I/O In: -  Out: 1300 [Urine:1300]  Physical Exam: Neck: no adenopathy, no carotid bruit, no JVD and supple, symmetrical, trachea midline Lungs: clear to auscultation bilaterally Heart: irregularly irregular rhythm, S1, S2 normal and Soft systolic murmur noted Abdomen: soft, non-tender; bowel sounds normal; no masses,  no organomegaly Extremities: No clubbing cyanosis 3+ edema noted  Lab Results:  Basename 03/10/12 0600 03/09/12 0529  WBC 4.7 4.4  HGB 10.5* 10.5*  PLT 80* 92*    Basename 03/10/12 0600 03/09/12 0529  NA 139 141  K 3.6 3.6  CL 100 103  CO2 29 28  GLUCOSE 78 86  BUN 10 9  CREATININE 0.92 0.88   No results found for this basename: TROPONINI:2,CK,MB:2 in the last 72 hours Hepatic Function Panel  Basename 03/08/12 0133  PROT 6.3  ALBUMIN 2.6*  AST 25  ALT 18  ALKPHOS 125*  BILITOT 0.9  BILIDIR --  IBILI --   No results found for this basename: CHOL in the last 72 hours No results found for this basename: PROTIME in the last 72 hours  Imaging: Imaging results have been reviewed and Dg Chest Port 1 View  03/08/2012  *RADIOLOGY REPORT*  Clinical Data: Central line placement.  PORTABLE CHEST - 1 VIEW  Comparison: 02/07/2012  Findings: Left arm PICC line is seen with tip in the proximal right atrium, approximately 3 cm below the expected level of the  superior cavoatrial junction.  Right arm PICC line remains in appropriate position.  Cardiomegaly stable.  Low lung volumes and pulmonary vascular congestion show no significant change.  No evidence of pleural effusion.  IMPRESSION:  New left arm PICC line tip is in the proximal right atrium, approximately 3 cm below expected level of superior cavoatrial junction.   Original Report Authenticated By: Myles Rosenthal, M.D.     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

## 2012-03-10 NOTE — Progress Notes (Signed)
ANTICOAGULATION CONSULT NOTE - Follow Up Consult  Pharmacy Consult for coumadin/lovenox Indication: DVT, h/o afib   Allergies  Allergen Reactions  . Codeine Nausea Only    Patient Measurements: Height: 5' 6.14" (168 cm) Weight: 229 lb 4.8 oz (104.01 kg) IBW/kg (Calculated) : 59.63   Vital Signs: Temp: 98 F (36.7 C) (12/16 0656) Temp src: Oral (12/16 0656) BP: 128/74 mmHg (12/16 0656) Pulse Rate: 95  (12/16 0656)  Labs:  Basename 03/10/12 0600 03/09/12 0529 03/08/12 0133 03/07/12 2301  HGB 10.5* 10.5* -- --  HCT 33.7* 33.9* 38.7 --  PLT 80* 92* 96* --  APTT -- -- 33 37  LABPROT 23.2* 20.1* 17.1* --  INR 2.16* 1.78* 1.43 --  HEPARINUNFRC -- -- -- --  CREATININE 0.92 0.88 0.79 --  CKTOTAL -- -- -- --  CKMB -- -- -- --  TROPONINI -- -- -- --    Estimated Creatinine Clearance: 66.5 ml/min (by C-G formula based on Cr of 0.92).   Assessment: 71 yo female with recent admit (11/10-11/24) for NSTEMI, enterococcaal endocarditis, and GIB 2/2 antral polyp s/p polypectomy. Patient with h/o atrial fibrillation with left atrial thrombus previously on apixaban (prior to 11/10 hospitalization). Since discharge, patient with PICC line receiving antibiotics for endocarditis (6 week course to be completed 12/24). She has not been on any blood thinners, including apixaban since d/c from hospital. Now with upper extremity DVT. Lovenox d/c due to nose bleed and BRBPR but this has resolved. INR is now therapeutic at 2.16 after a fairly rapid rise in INR. CBC is stable and pt remains thrombocytopenic.     Goal of Therapy:  INR 2-3   Plan:  1. Coumadin 2.5mg  PO x 1 tonight 2. F/u AM INR  Lysle Pearl, PharmD, BCPS Pager # [phone] 03/10/2012 9:11 AM

## 2012-03-11 LAB — PROTIME-INR
INR: 2.41 — ABNORMAL HIGH (ref 0.00–1.49)
Prothrombin Time: 25.1 seconds — ABNORMAL HIGH (ref 11.6–15.2)

## 2012-03-11 LAB — CBC
Platelets: 81 10*3/uL — ABNORMAL LOW (ref 150–400)
RBC: 3.29 MIL/uL — ABNORMAL LOW (ref 3.87–5.11)
RDW: 21.5 % — ABNORMAL HIGH (ref 11.5–15.5)
WBC: 4 10*3/uL (ref 4.0–10.5)

## 2012-03-11 LAB — BASIC METABOLIC PANEL
CO2: 33 mEq/L — ABNORMAL HIGH (ref 19–32)
Calcium: 8.7 mg/dL (ref 8.4–10.5)
Creatinine, Ser: 0.91 mg/dL (ref 0.50–1.10)
GFR calc Af Amer: 71 mL/min — ABNORMAL LOW (ref >90)
GFR calc non Af Amer: 61 mL/min — ABNORMAL LOW (ref >90)
Sodium: 142 mEq/L (ref 135–145)

## 2012-03-11 MED ORDER — POTASSIUM CHLORIDE CRYS ER 20 MEQ PO TBCR: 40.0000 meq | EXTENDED_RELEASE_TABLET | Freq: Two times a day (BID) | ORAL | Status: DC

## 2012-03-11 MED ORDER — METOPROLOL TARTRATE 50 MG PO TABS: 50.0000 mg | ORAL_TABLET | Freq: Two times a day (BID) | ORAL | Status: DC

## 2012-03-11 MED ORDER — WARFARIN SODIUM 2 MG PO TABS: 2.0000 mg | ORAL_TABLET | Freq: Every day | ORAL | Status: DC

## 2012-03-11 MED ORDER — WARFARIN SODIUM 2.5 MG PO TABS
2.5000 mg | ORAL_TABLET | Freq: Once | ORAL | Status: DC
  Filled 2012-03-11: qty 1

## 2012-03-11 MED ORDER — METOLAZONE 2.5 MG PO TABS: 2.5000 mg | ORAL_TABLET | Freq: Every day | ORAL | Status: DC

## 2012-03-11 MED ORDER — FUROSEMIDE 40 MG PO TABS: 80.0000 mg | ORAL_TABLET | Freq: Two times a day (BID) | ORAL | Status: DC

## 2012-03-11 MED ORDER — HEPARIN SOD (PORK) LOCK FLUSH 100 UNIT/ML IV SOLN
250.0000 [IU] | INTRAVENOUS | Status: AC | PRN
  Administered 2012-03-11: 250 [IU]

## 2012-03-11 NOTE — Progress Notes (Signed)
Pt discharge instructions and patient education complete. MD order to continue PICC at discharge. Site WNL. No further questions. D/C home with family. Dion Saucier

## 2012-03-11 NOTE — Discharge Summary (Signed)
  Discharge summary dictated on 03/11/2012 dictation number is [phone]

## 2012-03-11 NOTE — Discharge Summary (Signed)
[name], [name]               ACCOUNT NO.:  [id number]  MEDICAL RECORD NO.:  [id number]  LOCATION:  2005                         FACILITY:  [facility]  PHYSICIAN:  Eduardo Osier. Sharyn Lull, M.D. DATE OF BIRTH:  09/29/1940  DATE OF ADMISSION:  03/07/2012 DATE OF DISCHARGE:  03/11/2012                              DISCHARGE SUMMARY   ADMITTING DIAGNOSES: 1. Right extremity deep venous thrombosis. 2. __________. 3. Status post recent non-Q-wave myocardial infarction. 4. Chronic atrial fibrillation __________. 5. History of __________. 6. Status post recent enterococcus urinary tract infection/bacteremia. 7. 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 8. History of rheumatoid arthritis. 9. Coronary artery disease, history of aortic stenosis, status post     coronary artery bypass graft __________. 10.Hypertension. 11.Hypercholesterolemia. 12.History of pleuropericarditis in the remote past. 13.History of gastrointestinal bleed also in the remote past. 14.Status post __________ thrombocytopenia.  FINAL DIAGNOSES: 1. Right extremity deep venous thrombosis. 2. __________. 3. Status post recent non-Q-wave myocardial infarction. 4. Chronic atrial fibrillation __________. 5. History of __________. 6. Status post recent enterococcus urinary tract infection/bacteremia. 7. 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 8. History of rheumatoid arthritis. 9. Coronary artery disease, history of aortic stenosis, status post     coronary artery bypass graft __________. 10.Hypertension. 11.Hypercholesterolemia. 12.History of pleuropericarditis in the remote past. 13.History of gastrointestinal bleed also in the remote past. 14.Status post __________ thrombocytopenia.  DISCHARGE HOME MEDICATIONS: 1. Zaroxolyn 2.5 mg 1 tablet daily. 2. Warfarin 2 mg daily. 3. __________. 4. Lasix 80 mg twice daily. 5. Lopressor 50 mg twice daily. 6. Potassium chloride 40 mEq twice  daily.  Continue rest of the home medications which are: 1. Symbicort 2 puffs twice daily. 2. Ceftriaxone 2 g daily. 3. Ferrous sulfate 325 mg daily. 4. Plaquenil 200 mg twice daily. 5. Protonix 40 mg daily. 6. Prednisone 5 mg daily. 7. ReQuip 1 mg daily at night. 8. Crestor 10 mg daily. 9. Ampicillin 2 g every 8 hours. 10.Sulfasalazine 1000 mg twice daily. 11.Ampicillin and ceftriaxone will be discontinued on December 24 as     scheduled.  FOLLOWUP:  Follow up with me in 1 week.  Follow up with Infectious Disease as scheduled.  CONDITION AT DISCHARGE:  Stable.  INSTRUCTIONS:  The patient has been advised to monitor and check PT/INR end of this week.  While on Coumadin, the patient has been advised if she feels any dizziness, lightheadedness, or any bleeding, should call 911 and go to the hospital.  BRIEF HISTORY AND HOSPITAL COURSE:  [name] is a 71 year old female with past medical history significant for multiple medical problems, i.e., coronary artery disease status post CABG and AVR in the past, status post recent non-Q-wave myocardial infarction approximately a month ago, native mitral valve enterococcus endocarditis.  The patient on ampicillin and ceftriaxone for approximately last 4 weeks via right arm PICC line.  Hypertension, chronic atrial fibrillation with left atrial thrombus, history of recent GI bleeding secondary to antral polyp, status post polypectomy, status post recent enterococcus UTI/bacteremia, mixed connective tissue disease with pulmonary fibrosis, history of rheumatoid arthritis, hypercholesteremia, history of pleuropericarditis in the past, status post acute renal insufficiency in the past, complains of right upper  extremity swelling and petechiae in the right forearm and wrist and leg since Monday.  She saw Dr. Ninetta Lights today on December 13 and had hand duplex of the upper extremities, which showed DVT of the right upper extremity.  The patient was  advised to come to ER for further management.  The patient denies any chest pain or shortness of breath.  States her breathing has improved after increasing Lasix dose, but continues to have significant swelling and weeping from her legs.  Denies any fever or chills.  Denies any nausea, vomiting, diaphoresis.  Denies any diarrhea.  States has been tolerating antibiotic okay.  The patient will complete the course of 6 weeks of antibiotics in approximately 11 more days.  PAST MEDICAL HISTORY:  As above.  PAST SURGICAL HISTORY:  She had cardiac catheterization and aortic valve replacement in 2006.  Also, had coronary artery bypass grafting.  Had upper endoscopy in January of 2013.  Had TEE in November 2013, which showed native mitral valve vegetations.  Also, had upper endoscopy in September 2013 requiring polypectomy.  PHYSICAL EXAMINATION:  VITAL SIGNS:  Her blood pressure was 129/61, pulse was 82.  She was afebrile. EYES:  Conjunctivae were pink. NECK:  Supple. LUNGS:  Clear to auscultation without rhonchi or rales. CARDIOVASCULAR:  Irregularly irregular.  S1, S2 was soft.  There was soft systolic murmur.  No S3 gallop. ABDOMEN:  Soft.  Bowel sounds were present. EXTREMITIES:  No clubbing, cyanosis.  There was 4+ edema in the lower extremities and 1+ edema in the right upper extremity.  Petechiae were noted in upper and lower extremities. NEUROLOGIC:  Grossly intact.  LABS:  Sodium was 142, potassium 3.5, BUN was 10, creatinine 0.80, glucose was 107, repeat fasting sugar was 95, hemoglobin was 11.7, hematocrit 37.8, white count of 4.7, platelet count was 88,000, which has been stable for last few weeks.  Her PT was 16.9, INR 1.41.  Today PT is 35.1, INR 2.41.  Today's sodium is 142, potassium 3.1, BUN 11, creatinine 0.91, hemoglobin is 10.6, hematocrit 33.9, white count of 4.0, which has been stable for last 2 days.  BRIEF HOSPITAL COURSE:  The patient was admitted to telemetry  unit, was started on Lovenox and Coumadin per pharmacy protocol.  Her right upper arm PICC line was discontinued and was switched to left upper arm.  The patient had bright red blood per rectum and also epistaxis requiring stopping the Lovenox, but was continued on Coumadin.  The patient had not had any further episodes of bleeding per rectum or epistaxis.  Her hemoglobin has been stable for last 2 days.  Her INR is in therapeutic range.  The patient was also started on high dose of IV Lasix with good diuresis with significant improvement in her leg swelling.  Zaroxolyn was also added with excellent diuresis.  OT/PT consultation was called. The patient has been ambulating in room without any problems.  The patient remained afebrile during the hospital stay and is tolerating antibiotic with occasional diarrhea, requiring Imodium.  The patient will be discharged home on above medications and will be followed up in my office in 1 week.  The patient has been advised to check PT/INR later this week.     Eduardo Osier. Sharyn Lull, M.D.     [facility]  D:  03/11/2012  T:  03/11/2012  Job:  [id number]

## 2012-03-11 NOTE — Progress Notes (Signed)
ANTICOAGULATION CONSULT NOTE - Follow Up Consult  Pharmacy Consult for coumadin/lovenox Indication: DVT, h/o afib   Allergies  Allergen Reactions  . Codeine Nausea Only    Patient Measurements: Height: 5' 6.14" (168 cm) Weight: 229 lb 4.8 oz (104.01 kg) IBW/kg (Calculated) : 59.63   Vital Signs: Temp: 97.6 F (36.4 C) (12/17 0601) Temp src: Oral (12/17 0601) BP: 128/72 mmHg (12/17 0601) Pulse Rate: 91  (12/17 0601)  Labs:  Basename 03/11/12 0548 03/10/12 0600 03/09/12 0529  HGB 10.6* 10.5* --  HCT 33.9* 33.7* 33.9*  PLT 81* 80* 92*  APTT -- -- --  LABPROT 25.1* 23.2* 20.1*  INR 2.41* 2.16* 1.78*  HEPARINUNFRC -- -- --  CREATININE 0.91 0.92 0.88  CKTOTAL -- -- --  CKMB -- -- --  TROPONINI -- -- --    Estimated Creatinine Clearance: 67.3 ml/min (by C-G formula based on Cr of 0.91).   Assessment: 71 yo female with recent admit (11/10-11/24) for NSTEMI, enterococcaal endocarditis, and GIB 2/2 antral polyp s/p polypectomy. Patient with h/o atrial fibrillation with left atrial thrombus previously on apixaban (prior to 11/10 hospitalization). Since discharge, patient with PICC line receiving antibiotics for endocarditis (6 week course to be completed 12/24). She has not been on any blood thinners, including apixaban since d/c from hospital. Now with upper extremity DVT. Lovenox d/c due to nose bleed and BRBPR but this has resolved. INR is now therapeutic at 2.41. CBC is stable and pt remains thrombocytopenic.     Goal of Therapy:  INR 2-3   Plan:  1. Repeat Coumadin 2.5mg  PO x 1 tonight 2. F/u AM INR  Lysle Pearl, PharmD, BCPS Pager # [phone] 03/11/2012 9:35 AM

## 2012-03-14 ENCOUNTER — Telehealth: Payer: Self-pay | Admitting: *Deleted

## 2012-03-14 LAB — CULTURE, BLOOD (ROUTINE X 2): Culture: NO GROWTH

## 2012-03-14 NOTE — Telephone Encounter (Signed)
Thanks [name]!

## 2012-03-14 NOTE — Telephone Encounter (Signed)
Message copied by Macy Mis on Fri Mar 14, 2012  8:58 AM ------      Message from: Pieter Partridge L      Created: Thu Mar 13, 2012  5:26 PM       Annice Pih,      [facility] called today and states that the patient did not keep her appointment there.  Patient's mother told them they received care at the hospital (she was an ED admission on 12/13).      Thanks      Asher Muir

## 2012-03-21 ENCOUNTER — Emergency Department (HOSPITAL_COMMUNITY)
Admission: EM | Admit: 2012-03-21 | Discharge: 2012-03-21 | Disposition: A | Discharge status: Home / Self Care | Payer: Medicare Other | Attending: Emergency Medicine | Admitting: Emergency Medicine

## 2012-03-21 ENCOUNTER — Encounter (HOSPITAL_COMMUNITY): Payer: Self-pay | Admitting: *Deleted

## 2012-03-21 DIAGNOSIS — E860 Dehydration: Secondary | ICD-10-CM | POA: Insufficient documentation

## 2012-03-21 DIAGNOSIS — E785 Hyperlipidemia, unspecified: Secondary | ICD-10-CM | POA: Insufficient documentation

## 2012-03-21 DIAGNOSIS — Z7901 Long term (current) use of anticoagulants: Secondary | ICD-10-CM | POA: Insufficient documentation

## 2012-03-21 DIAGNOSIS — T360X5A Adverse effect of penicillins, initial encounter: Secondary | ICD-10-CM | POA: Insufficient documentation

## 2012-03-21 DIAGNOSIS — K219 Gastro-esophageal reflux disease without esophagitis: Secondary | ICD-10-CM | POA: Insufficient documentation

## 2012-03-21 DIAGNOSIS — Z79899 Other long term (current) drug therapy: Secondary | ICD-10-CM | POA: Insufficient documentation

## 2012-03-21 DIAGNOSIS — Z8701 Personal history of pneumonia (recurrent): Secondary | ICD-10-CM | POA: Insufficient documentation

## 2012-03-21 DIAGNOSIS — T887XXA Unspecified adverse effect of drug or medicament, initial encounter: Secondary | ICD-10-CM

## 2012-03-21 DIAGNOSIS — T361X5A Adverse effect of cephalosporins and other beta-lactam antibiotics, initial encounter: Secondary | ICD-10-CM | POA: Insufficient documentation

## 2012-03-21 DIAGNOSIS — D649 Anemia, unspecified: Secondary | ICD-10-CM | POA: Insufficient documentation

## 2012-03-21 DIAGNOSIS — Z862 Personal history of diseases of the blood and blood-forming organs and certain disorders involving the immune mechanism: Secondary | ICD-10-CM | POA: Insufficient documentation

## 2012-03-21 DIAGNOSIS — Z8679 Personal history of other diseases of the circulatory system: Secondary | ICD-10-CM | POA: Insufficient documentation

## 2012-03-21 DIAGNOSIS — Z954 Presence of other heart-valve replacement: Secondary | ICD-10-CM | POA: Insufficient documentation

## 2012-03-21 DIAGNOSIS — Z951 Presence of aortocoronary bypass graft: Secondary | ICD-10-CM | POA: Insufficient documentation

## 2012-03-21 DIAGNOSIS — Z9861 Coronary angioplasty status: Secondary | ICD-10-CM | POA: Insufficient documentation

## 2012-03-21 DIAGNOSIS — I10 Essential (primary) hypertension: Secondary | ICD-10-CM | POA: Insufficient documentation

## 2012-03-21 DIAGNOSIS — I2510 Atherosclerotic heart disease of native coronary artery without angina pectoris: Secondary | ICD-10-CM | POA: Insufficient documentation

## 2012-03-21 DIAGNOSIS — Z8719 Personal history of other diseases of the digestive system: Secondary | ICD-10-CM | POA: Insufficient documentation

## 2012-03-21 LAB — URINALYSIS, ROUTINE W REFLEX MICROSCOPIC
Glucose, UA: NEGATIVE mg/dL
Leukocytes, UA: NEGATIVE
Nitrite: NEGATIVE
Specific Gravity, Urine: 1.013 (ref 1.005–1.030)
pH: 7 (ref 5.0–8.0)

## 2012-03-21 LAB — BASIC METABOLIC PANEL
BUN: 25 mg/dL — ABNORMAL HIGH (ref 6–23)
CO2: 34 mEq/L — ABNORMAL HIGH (ref 19–32)
Chloride: 90 mEq/L — ABNORMAL LOW (ref 96–112)
Creatinine, Ser: 1.44 mg/dL — ABNORMAL HIGH (ref 0.50–1.10)
GFR calc Af Amer: 41 mL/min — ABNORMAL LOW (ref >90)
Potassium: 3.2 mEq/L — ABNORMAL LOW (ref 3.5–5.1)

## 2012-03-21 LAB — CBC WITH DIFFERENTIAL/PLATELET
Basophils Absolute: 0.1 10*3/uL (ref 0.0–0.1)
Basophils Relative: 1 % (ref 0–1)
HCT: 39.4 % (ref 36.0–46.0)
Hemoglobin: 12.8 g/dL (ref 12.0–15.0)
Lymphocytes Relative: 41 % (ref 12–46)
MCHC: 32.5 g/dL (ref 30.0–36.0)
Monocytes Absolute: 0.8 10*3/uL (ref 0.1–1.0)
Monocytes Relative: 16 % — ABNORMAL HIGH (ref 3–12)
Neutro Abs: 2.2 10*3/uL (ref 1.7–7.7)
Neutrophils Relative %: 41 % — ABNORMAL LOW (ref 43–77)
RBC: 3.98 MIL/uL (ref 3.87–5.11)
WBC: 5.3 10*3/uL (ref 4.0–10.5)

## 2012-03-21 LAB — URINE MICROSCOPIC-ADD ON

## 2012-03-21 LAB — PROTIME-INR: INR: 2.51 — ABNORMAL HIGH (ref 0.00–1.49)

## 2012-03-21 NOTE — ED Notes (Signed)
Pt has a home health nurse who came by today and advised that she come to the ED for eval of hypotension.  Pt denies dizziness or weakness.  Pt denies pain at this time.  Pt does have a PICC line in her upper (L) arm that is for antibiotics.  NAD noted, pt sitting up talking, A/O x 4.

## 2012-03-21 NOTE — ED Notes (Signed)
Old and new EKG to Dr Ranae Palms

## 2012-03-21 NOTE — ED Notes (Addendum)
Pt has picc line and wants blood drawn that way. RN notified to contact IV team.

## 2012-03-21 NOTE — ED Provider Notes (Signed)
History  This chart was scribed for Loren Racer, MD by Bennett Scrape, ED Scribe. This patient was seen in room TR07C/TR07C and the patient's care was started at 2:59 PM.  CSN: [id number]  Arrival date & time 03/21/12  1351   First MD Initiated Contact with Patient 03/21/12 1459      Chief Complaint  Patient presents with  . Hypotension     The history is provided by the patient. No language interpreter was used.   [name] is a 71 y.o. female who presents to the Emergency Department complaining of hypotension with a BP of 85/76. Daughter reports that the pt's home health nurse came by today to take cultures after the pt finished rocephin and ampicillin 2 nights ago for endocarditis though a PICC line in the left upper arm. PICC line is still in place. She is on HTN medications (metoprolol and rosuvastatin) and reports that she had taken her medications about 45 minutes before the nurse arrived. She is currently on O2 but reports that this is normal. She denies any new SOB, fevers, chills and confusion as associated symptoms. She has a h/o CAD, A. Fib, and lupus. She denies smoking and alcohol use.   [name] is PCP.   Past Medical History  Diagnosis Date  . Coronary artery disease   . Atrial fibrillation     Now NSR  . GERD (gastroesophageal reflux disease)   . Hyperlipidemia   . 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 Anemia   . Angina   . Shortness of breath     with activity  . Hypertension   . Blood transfusion   . Pneumonia   . H/O hiatal hernia   . Lupus     Past Surgical History  Procedure Date  . Cardiac catheterization   . Cardiac valve replacement     2006  . Coronary artery bypass graft   . Givens capsule study 04/09/2011    Procedure: GIVENS CAPSULE STUDY;  Surgeon: Theda Belfast, MD;  Location: [facility] ENDOSCOPY;  Service: Endoscopy;  Laterality: N/A;  . Tee without cardioversion 02/06/2012    Procedure: TRANSESOPHAGEAL  ECHOCARDIOGRAM (TEE);  Surgeon: Ricki Rodriguez, MD;  Location: [facility] ENDOSCOPY;  Service: Cardiovascular;  Laterality: N/A;  . Esophagogastroduodenoscopy 02/12/2012    Procedure: ESOPHAGOGASTRODUODENOSCOPY (EGD);  Surgeon: Theda Belfast, MD;  Location: [facility] ENDOSCOPY;  Service: Endoscopy;  Laterality: N/A;    Family History  Problem Relation Age of Onset  . Heart disease Mother     CHF  . Mental illness Father     suicide  . Cancer Sister     pancreatic cancer  . COPD Brother     History  Substance Use Topics  . Smoking status: Never Smoker   . Smokeless tobacco: Never Used  . Alcohol Use: No    No OB history provided.  Review of Systems  Constitutional: Negative for fever and chills.  Respiratory: Negative for cough and shortness of breath.   Gastrointestinal: Negative for nausea and vomiting.  Neurological: Negative for dizziness.  All other systems reviewed and are negative.    Allergies  Codeine  Home Medications   Current Outpatient Rx  Name  Route  Sig  Dispense  Refill  . BUDESONIDE-FORMOTEROL FUMARATE 160-4.5 MCG/ACT IN AERO   Inhalation   Inhale 2 puffs into the lungs 2 (two) times daily.   1 Inhaler   12   . FERROUS SULFATE 325 (  65 FE) MG PO TABS   Oral   Take 325 mg by mouth daily with breakfast.         . FUROSEMIDE 40 MG PO TABS   Oral   Take 2 tablets (80 mg total) by mouth 2 (two) times daily.   60 tablet   3   . HYDROXYCHLOROQUINE SULFATE 200 MG PO TABS   Oral   Take 200 mg by mouth 2 (two) times daily.          . IBUPROFEN 200 MG PO TABS   Oral   Take 400 mg by mouth at bedtime.         Marland Kitchen METOLAZONE 2.5 MG PO TABS   Oral   Take 1 tablet (2.5 mg total) by mouth daily.   30 tablet   3   . METOPROLOL TARTRATE 50 MG PO TABS   Oral   Take 1 tablet (50 mg total) by mouth 2 (two) times daily.   60 tablet   3   . PANTOPRAZOLE SODIUM 40 MG PO TBEC   Oral   Take 40 mg by mouth every morning.         Marland Kitchen POTASSIUM CHLORIDE CRYS  ER 20 MEQ PO TBCR   Oral   Take 2 tablets (40 mEq total) by mouth 2 (two) times daily.   120 tablet   3   . PREDNISONE 5 MG PO TABS   Oral   Take 5 mg by mouth every morning.          Marland Kitchen ROPINIROLE HCL 1 MG PO TABS   Oral   Take 1 mg by mouth at bedtime. Daily at bedtime         . ROSUVASTATIN CALCIUM 10 MG PO TABS   Oral   Take 10 mg by mouth daily.           . SULFASALAZINE 500 MG PO TABS   Oral   Take 1,000 mg by mouth 2 (two) times daily.          . WARFARIN SODIUM 2 MG PO TABS   Oral   Take 2 mg by mouth every morning.           Triage Vitals: BP 109/62  Pulse 82  Temp 98.2 F (36.8 C)  Resp 16  SpO2 97%  Physical Exam  Nursing note and vitals reviewed. Constitutional: She is oriented to person, place, and time. She appears well-developed and well-nourished. No distress.  HENT:  Head: Normocephalic and atraumatic.  Mouth/Throat: Oropharynx is clear and moist.       Moist mucous membranes  Eyes: Conjunctivae normal and EOM are normal. Pupils are equal, round, and reactive to light.  Neck: Neck supple. No tracheal deviation present.  Cardiovascular: Normal rate and regular rhythm.   Pulmonary/Chest: Effort normal and breath sounds normal. No respiratory distress. She has no wheezes. She has no rales.  Abdominal: Soft. There is no tenderness.  Musculoskeletal: Normal range of motion. She exhibits no edema.       PICC line in left arm, no evidence of infection  Neurological: She is alert and oriented to person, place, and time.       Good strength throughout, no sensory deficits, no dizziness with sitting up  Skin: Skin is warm and dry.  Psychiatric: She has a normal mood and affect. Her behavior is normal.    ED Course  Procedures (including critical care time)  DIAGNOSTIC STUDIES: Oxygen Saturation is 97% on  O2, normal by my interpretation.    COORDINATION OF CARE: 3:14 PM- Discussed treatment plan which includes CBC panel and orthostatic  vitals with pt at bedside and pt agreed to plan.  .6:12 PM- Advised pt's daughter of lab work results. Discussed discharge plan with pt's daughter and she agreed to plan. Also advised her to follow up with pt's PCP and she agreed.  Labs Reviewed  CBC WITH DIFFERENTIAL - Abnormal; Notable for the following:    RDW 18.3 (*)     Neutrophils Relative 41 (*)     Monocytes Relative 16 (*)     All other components within normal limits  BASIC METABOLIC PANEL - Abnormal; Notable for the following:    Potassium 3.2 (*)     Chloride 90 (*)     CO2 34 (*)     Glucose, Bld 122 (*)     BUN 25 (*)     Creatinine, Ser 1.44 (*)     GFR calc non Af Amer 35 (*)     GFR calc Af Amer 41 (*)     All other components within normal limits  URINALYSIS, ROUTINE W REFLEX MICROSCOPIC - Abnormal; Notable for the following:    Hgb urine dipstick TRACE (*)     All other components within normal limits  PROTIME-INR - Abnormal; Notable for the following:    Prothrombin Time 25.9 (*)     INR 2.51 (*)     All other components within normal limits  URINE MICROSCOPIC-ADD ON - Abnormal; Notable for the following:    Bacteria, UA FEW (*)     Casts HYALINE CASTS (*)     All other components within normal limits   No results found.   1. Medication side effect   2. Mild dehydration      Date: 03/21/2012  Rate: 79  Rhythm: Atrial fibrillation  QRS Axis: normal  Intervals: normal  ST/T Wave abnormalities: ST depressions inferiorly and ST depressions laterally  Conduction Disutrbances:none  Narrative Interpretation:   Old EKG Reviewed: unchanged from 02/03/12    MDM  I personally performed the services described in this documentation, which was scribed in my presence. The recorded information has been reviewed and is accurate.  Pt with mild dehydration as evidenced by increased HGB and Cr. Daughter states she has been on increased doses of lasix recently. Suspect transient drop in BP due to medication and  mild dehydration. Pt is completely asymptomatic at this time. Will d/c to f/u with her PMD. Return for dizziness, fever, weakness, CP or any concerns.   Loren Racer, MD 03/21/12 Ernestina [place]

## 2012-03-21 NOTE — ED Notes (Signed)
Pt denies dizziness, lightheadedness, or near falls. A&Ox4, ambulatory, nad. Pt was sent to ED from [facility] health nurse. Family at bedside.

## 2012-03-21 NOTE — ED Notes (Signed)
IV team called and informed of PICC line to draw blood.

## 2012-03-24 ENCOUNTER — Telehealth: Payer: Self-pay | Admitting: *Deleted

## 2012-03-24 NOTE — Telephone Encounter (Signed)
Verbal order given to nurse at [facility] home care providers to pull PICC per Dr. Moshe Cipro last office note.  IV antibiotic therapy ended 03/18/12. # [phone] Wendall Mola CMA

## 2012-03-28 ENCOUNTER — Encounter (HOSPITAL_BASED_OUTPATIENT_CLINIC_OR_DEPARTMENT_OTHER): Payer: Medicare Other

## 2012-04-07 NOTE — Clinical Documentation Improvement (Signed)
Patient had acute small non-Q-wave myocardial infarction with atypical presentation

## 2012-05-02 ENCOUNTER — Encounter (HOSPITAL_BASED_OUTPATIENT_CLINIC_OR_DEPARTMENT_OTHER): Payer: Medicare Other

## 2012-05-27 ENCOUNTER — Other Ambulatory Visit: Payer: Self-pay | Admitting: Critical Care Medicine

## 2012-05-28 ENCOUNTER — Telehealth: Payer: Self-pay | Admitting: Critical Care Medicine

## 2012-05-28 MED ORDER — PANTOPRAZOLE SODIUM 40 MG PO TBEC: 40.0000 mg | DELAYED_RELEASE_TABLET | Freq: Every morning | ORAL | Status: DC

## 2012-05-28 NOTE — Telephone Encounter (Signed)
I spoke with pt. She is requesting a refill on protonix. Pt states she will keep appt. I have sent in  RX as this has been refilled by PW SEVERAL TIMES IN THE PAST. NOTHING FURTHER WAS NEEDED

## 2012-06-28 ENCOUNTER — Other Ambulatory Visit: Payer: Self-pay | Admitting: Critical Care Medicine

## 2012-06-29 ENCOUNTER — Emergency Department (HOSPITAL_COMMUNITY)
Admission: EM | Admit: 2012-06-29 | Discharge: 2012-06-29 | Disposition: A | Discharge status: Home / Self Care | Payer: Medicare Other | Attending: Emergency Medicine | Admitting: Emergency Medicine

## 2012-06-29 ENCOUNTER — Emergency Department (HOSPITAL_COMMUNITY): Payer: Medicare Other

## 2012-06-29 ENCOUNTER — Encounter (HOSPITAL_COMMUNITY): Payer: Self-pay

## 2012-06-29 DIAGNOSIS — R5381 Other malaise: Secondary | ICD-10-CM | POA: Insufficient documentation

## 2012-06-29 DIAGNOSIS — Z8709 Personal history of other diseases of the respiratory system: Secondary | ICD-10-CM | POA: Insufficient documentation

## 2012-06-29 DIAGNOSIS — R112 Nausea with vomiting, unspecified: Secondary | ICD-10-CM | POA: Insufficient documentation

## 2012-06-29 DIAGNOSIS — E876 Hypokalemia: Secondary | ICD-10-CM | POA: Insufficient documentation

## 2012-06-29 DIAGNOSIS — Z862 Personal history of diseases of the blood and blood-forming organs and certain disorders involving the immune mechanism: Secondary | ICD-10-CM | POA: Insufficient documentation

## 2012-06-29 DIAGNOSIS — K219 Gastro-esophageal reflux disease without esophagitis: Secondary | ICD-10-CM | POA: Insufficient documentation

## 2012-06-29 DIAGNOSIS — Z954 Presence of other heart-valve replacement: Secondary | ICD-10-CM | POA: Insufficient documentation

## 2012-06-29 DIAGNOSIS — E785 Hyperlipidemia, unspecified: Secondary | ICD-10-CM | POA: Insufficient documentation

## 2012-06-29 DIAGNOSIS — I10 Essential (primary) hypertension: Secondary | ICD-10-CM | POA: Insufficient documentation

## 2012-06-29 DIAGNOSIS — Z8679 Personal history of other diseases of the circulatory system: Secondary | ICD-10-CM | POA: Insufficient documentation

## 2012-06-29 DIAGNOSIS — Z8719 Personal history of other diseases of the digestive system: Secondary | ICD-10-CM | POA: Insufficient documentation

## 2012-06-29 DIAGNOSIS — Z7901 Long term (current) use of anticoagulants: Secondary | ICD-10-CM | POA: Insufficient documentation

## 2012-06-29 DIAGNOSIS — Z8701 Personal history of pneumonia (recurrent): Secondary | ICD-10-CM | POA: Insufficient documentation

## 2012-06-29 DIAGNOSIS — Z9861 Coronary angioplasty status: Secondary | ICD-10-CM | POA: Insufficient documentation

## 2012-06-29 DIAGNOSIS — E871 Hypo-osmolality and hyponatremia: Secondary | ICD-10-CM | POA: Insufficient documentation

## 2012-06-29 DIAGNOSIS — Z79899 Other long term (current) drug therapy: Secondary | ICD-10-CM | POA: Insufficient documentation

## 2012-06-29 DIAGNOSIS — N390 Urinary tract infection, site not specified: Secondary | ICD-10-CM

## 2012-06-29 DIAGNOSIS — R531 Weakness: Secondary | ICD-10-CM

## 2012-06-29 DIAGNOSIS — Z8739 Personal history of other diseases of the musculoskeletal system and connective tissue: Secondary | ICD-10-CM | POA: Insufficient documentation

## 2012-06-29 DIAGNOSIS — Z951 Presence of aortocoronary bypass graft: Secondary | ICD-10-CM | POA: Insufficient documentation

## 2012-06-29 DIAGNOSIS — I2510 Atherosclerotic heart disease of native coronary artery without angina pectoris: Secondary | ICD-10-CM | POA: Insufficient documentation

## 2012-06-29 DIAGNOSIS — R197 Diarrhea, unspecified: Secondary | ICD-10-CM | POA: Insufficient documentation

## 2012-06-29 LAB — COMPREHENSIVE METABOLIC PANEL
Albumin: 3 g/dL — ABNORMAL LOW (ref 3.5–5.2)
BUN: 26 mg/dL — ABNORMAL HIGH (ref 6–23)
Chloride: 93 mEq/L — ABNORMAL LOW (ref 96–112)
Creatinine, Ser: 1.09 mg/dL (ref 0.50–1.10)
Total Bilirubin: 0.7 mg/dL (ref 0.3–1.2)

## 2012-06-29 LAB — URINALYSIS, ROUTINE W REFLEX MICROSCOPIC
Ketones, ur: NEGATIVE mg/dL
Nitrite: POSITIVE — AB
Protein, ur: NEGATIVE mg/dL
Urobilinogen, UA: 1 mg/dL (ref 0.0–1.0)
pH: 5.5 (ref 5.0–8.0)

## 2012-06-29 LAB — CBC WITH DIFFERENTIAL/PLATELET
Basophils Relative: 0 % (ref 0–1)
Eosinophils Absolute: 0 10*3/uL (ref 0.0–0.7)
HCT: 30.4 % — ABNORMAL LOW (ref 36.0–46.0)
Hemoglobin: 10.7 g/dL — ABNORMAL LOW (ref 12.0–15.0)
MCH: 31.3 pg (ref 26.0–34.0)
MCHC: 35.2 g/dL (ref 30.0–36.0)
MCV: 88.9 fL (ref 78.0–100.0)
Monocytes Absolute: 1 10*3/uL (ref 0.1–1.0)
Monocytes Relative: 11 % (ref 3–12)
Neutro Abs: 7 10*3/uL (ref 1.7–7.7)

## 2012-06-29 LAB — PROTIME-INR
INR: 2.71 — ABNORMAL HIGH (ref 0.00–1.49)
Prothrombin Time: 27.4 seconds — ABNORMAL HIGH (ref 11.6–15.2)

## 2012-06-29 LAB — PROCALCITONIN: Procalcitonin: <0.1 ng/mL

## 2012-06-29 LAB — URINE MICROSCOPIC-ADD ON

## 2012-06-29 LAB — LIPASE, BLOOD: Lipase: 72 U/L — ABNORMAL HIGH (ref 11–59)

## 2012-06-29 LAB — CG4 I-STAT (LACTIC ACID): Lactic Acid, Venous: 1.47 mmol/L (ref 0.5–2.2)

## 2012-06-29 MED ORDER — SODIUM CHLORIDE 0.9 % IV SOLN
1000.0000 mL | Freq: Once | INTRAVENOUS | Status: AC
  Administered 2012-06-29: 1000 mL via INTRAVENOUS

## 2012-06-29 MED ORDER — POTASSIUM CHLORIDE CRYS ER 20 MEQ PO TBCR
40.0000 meq | EXTENDED_RELEASE_TABLET | Freq: Once | ORAL | Status: AC
  Administered 2012-06-29: 40 meq via ORAL
  Filled 2012-06-29: qty 2

## 2012-06-29 MED ORDER — MAGNESIUM SULFATE 40 MG/ML IJ SOLN
2.0000 g | INTRAMUSCULAR | Status: AC
  Administered 2012-06-29: 2 g via INTRAVENOUS
  Filled 2012-06-29 (×2): qty 50

## 2012-06-29 MED ORDER — ONDANSETRON HCL 4 MG/2ML IJ SOLN
4.0000 mg | Freq: Once | INTRAMUSCULAR | Status: AC
  Administered 2012-06-29: 4 mg via INTRAVENOUS
  Filled 2012-06-29: qty 2

## 2012-06-29 MED ORDER — SODIUM CHLORIDE 0.9 % IV SOLN
1000.0000 mL | INTRAVENOUS | Status: DC
  Administered 2012-06-29: 1000 mL via INTRAVENOUS

## 2012-06-29 MED ORDER — DEXTROSE 5 % IV SOLN
1.0000 g | Freq: Once | INTRAVENOUS | Status: AC
  Administered 2012-06-29: 1 g via INTRAVENOUS
  Filled 2012-06-29: qty 10

## 2012-06-29 MED ORDER — CEPHALEXIN 500 MG PO CAPS: 500.0000 mg | ORAL_CAPSULE | Freq: Four times a day (QID) | ORAL | Status: DC

## 2012-06-29 MED ORDER — ONDANSETRON 8 MG PO TBDP: ORAL_TABLET | ORAL | Status: DC

## 2012-06-29 NOTE — ED Provider Notes (Signed)
History     CSN: [id number]  Arrival date & time 06/29/12  1602   First MD Initiated Contact with Patient 06/29/12 1624      Chief Complaint  Patient presents with  . Emesis  . Weakness    (Consider location/radiation/quality/duration/timing/severity/associated sxs/prior treatment) Patient is a 72 y.o. female presenting with vomiting and weakness. The history is provided by the patient, a relative and medical records. No language interpreter was used.  Emesis Severity:  Mild Duration:  2 days Timing:  Intermittent Number of daily episodes:  3 Emesis appearance: NBNB. Associated symptoms: diarrhea   Associated symptoms: no abdominal pain and no headaches   Weakness Associated symptoms include nausea, vomiting and weakness. Pertinent negatives include no abdominal pain, chest pain, coughing, diaphoresis, fatigue, fever, headaches or rash.   [name] is a 72 y.o. female  with a hx of coronary artery disease, itch or fibrillation, GERD, pulmonary fibrosis, anemia, hypertension, lupus presents to the Emergency Department complaining of gradual, persistent, progressively worsening generalized weakness beginning approximately one week ago. Family states patient was recently hospitalized in the fall for infectious pericarditis of unknown source and had 6 weeks of IV antibiotics. Family states patient has not bounced back since that time however generalized weakness changed approximately one week ago. Patient has had intermittent nausea and vomiting throughout the week increasing last night with 3 episodes of emesis this morning. Emesis described as nonbloody nonbilious the patient does not have associated abdominal pain. Patient denies blood in her stool, chest pain, shortness of breath, abdominal pain, headache, neck pain, fever, chills, dysuria, hematuria, syncope. Nothing makes weakness better and exertion makes it worse.  Family states these are the same symptoms patient had when she  developed the heart infection less time. Patient was evaluated by Dr. Sharyn Lull on Wednesday and [that time was normal. Family states that her one he wanted to admit her for stress test at some point if the weakness did not improve.     Past Medical History  Diagnosis Date  . Coronary artery disease   . Atrial fibrillation     Now NSR  . GERD (gastroesophageal reflux disease)   . Hyperlipidemia   . 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 Anemia   . Angina   . Shortness of breath     with activity  . Hypertension   . Blood transfusion   . Pneumonia   . H/O hiatal hernia   . Lupus     Past Surgical History  Procedure Laterality Date  . Cardiac catheterization    . Cardiac valve replacement      2006  . Coronary artery bypass graft    . Givens capsule study  04/09/2011    Procedure: GIVENS CAPSULE STUDY;  Surgeon: Theda Belfast, MD;  Location: [facility] ENDOSCOPY;  Service: Endoscopy;  Laterality: N/A;  . Tee without cardioversion  02/06/2012    Procedure: TRANSESOPHAGEAL ECHOCARDIOGRAM (TEE);  Surgeon: Ricki Rodriguez, MD;  Location: [facility] ENDOSCOPY;  Service: Cardiovascular;  Laterality: N/A;  . Esophagogastroduodenoscopy  02/12/2012    Procedure: ESOPHAGOGASTRODUODENOSCOPY (EGD);  Surgeon: Theda Belfast, MD;  Location: [facility] ENDOSCOPY;  Service: Endoscopy;  Laterality: N/A;    Family History  Problem Relation Age of Onset  . Heart disease Mother     CHF  . Mental illness Father     suicide  . Cancer Sister     pancreatic cancer  . COPD Brother  History  Substance Use Topics  . Smoking status: Never Smoker   . Smokeless tobacco: Never Used  . Alcohol Use: No    OB History   Grav Para Term Preterm Abortions TAB SAB Ect Mult Living                  Review of Systems  Constitutional: Negative for fever, diaphoresis, appetite change, fatigue and unexpected weight change.  HENT: Negative for mouth sores and neck stiffness.   Eyes: Negative for visual  disturbance.  Respiratory: Negative for cough, chest tightness, shortness of breath and wheezing.   Cardiovascular: Negative for chest pain.  Gastrointestinal: Positive for nausea, vomiting and diarrhea. Negative for abdominal pain and constipation.  Endocrine: Negative for polydipsia, polyphagia and polyuria.  Genitourinary: Negative for dysuria, urgency, frequency and hematuria.  Musculoskeletal: Negative for back pain.  Skin: Negative for rash.  Allergic/Immunologic: Negative for immunocompromised state.  Neurological: Positive for weakness. Negative for syncope, light-headedness and headaches.  Hematological: Does not bruise/bleed easily.  Psychiatric/Behavioral: Negative for sleep disturbance. The patient is not nervous/anxious.     Allergies  Codeine  Home Medications   Current Outpatient Rx  Name  Route  Sig  Dispense  Refill  . hydroxychloroquine (PLAQUENIL) 200 MG tablet   Oral   Take 200 mg by mouth 2 (two) times daily.          Marland Kitchen ibuprofen (ADVIL,MOTRIN) 200 MG tablet   Oral   Take 400 mg by mouth at bedtime.         . metolazone (ZAROXOLYN) 2.5 MG tablet   Oral   Take 1 tablet (2.5 mg total) by mouth daily.   30 tablet   3   . metoprolol (LOPRESSOR) 50 MG tablet   Oral   Take 1 tablet (50 mg total) by mouth 2 (two) times daily.   60 tablet   3   . pantoprazole (PROTONIX) 40 MG tablet   Oral   Take 1 tablet (40 mg total) by mouth every morning.   30 tablet   0   . potassium chloride SA (KLOR-CON M20) 20 MEQ tablet   Oral   Take 2 tablets (40 mEq total) by mouth 2 (two) times daily.   120 tablet   3   . predniSONE (DELTASONE) 5 MG tablet   Oral   Take 5 mg by mouth every morning.          Marland Kitchen rOPINIRole (REQUIP) 1 MG tablet   Oral   Take 1 mg by mouth at bedtime. Daily at bedtime         . rosuvastatin (CRESTOR) 10 MG tablet   Oral   Take 10 mg by mouth daily.           Marland Kitchen sulfaSALAzine (AZULFIDINE) 500 MG tablet   Oral   Take 1,000  mg by mouth 2 (two) times daily.          Marland Kitchen warfarin (COUMADIN) 4 MG tablet   Oral   Take 4 mg by mouth daily.         . cephALEXin (KEFLEX) 500 MG capsule   Oral   Take 1 capsule (500 mg total) by mouth 4 (four) times daily.   40 capsule   0   . ondansetron (ZOFRAN ODT) 8 MG disintegrating tablet      8mg  ODT q4 hours prn nausea   4 tablet   0     BP 117/63  Pulse 105  Temp(Src) 98.3  F (36.8 C) (Oral)  Resp 18  Ht 5\' 7"  (1.702 m)  Wt 160 lb (72.576 kg)  BMI 25.05 kg/m2  SpO2 97%  Physical Exam  Nursing note and vitals reviewed. Constitutional: She is oriented to person, place, and time. She appears well-developed and well-nourished. No distress.  Tired appearing  HENT:  Head: Normocephalic and atraumatic.  Right Ear: Tympanic membrane, external ear and ear canal normal.  Left Ear: Tympanic membrane, external ear and ear canal normal.  Nose: Nose normal. No mucosal edema or rhinorrhea.  Mouth/Throat: Uvula is midline, oropharynx is clear and moist and mucous membranes are normal. Mucous membranes are not dry and not cyanotic. No oropharyngeal exudate, posterior oropharyngeal edema, posterior oropharyngeal erythema or tonsillar abscesses.  Eyes: Conjunctivae and EOM are normal. Pupils are equal, round, and reactive to light. No scleral icterus.  Neck: Normal range of motion. Neck supple.  Cardiovascular: Normal rate and intact distal pulses.  An irregularly irregular rhythm present.  Pulses:      Radial pulses are 2+ on the right side, and 2+ on the left side.       Dorsalis pedis pulses are 2+ on the right side, and 2+ on the left side.       Posterior tibial pulses are 2+ on the right side, and 2+ on the left side.  Capillary refill < 3 sec  Pulmonary/Chest: Effort normal and breath sounds normal. No respiratory distress. She has no wheezes. She has no rales. She exhibits no tenderness.  Abdominal: Soft. Bowel sounds are normal. She exhibits no distension and no  mass. There is no tenderness. There is no rebound and no guarding.  Musculoskeletal: Normal range of motion. She exhibits no edema and no tenderness.  Lymphadenopathy:    She has no cervical adenopathy.  Neurological: She is alert and oriented to person, place, and time. No cranial nerve deficit. She exhibits normal muscle tone. Coordination normal.  Speech is clear and goal oriented Moves extremities without ataxia  Skin: Skin is warm and dry. No rash noted. She is not diaphoretic. No erythema. There is pallor.  Psychiatric: She has a normal mood and affect.    ED Course  Procedures (including critical care time)  Labs Reviewed  CBC WITH DIFFERENTIAL - Abnormal; Notable for the following:    RBC 3.42 (*)    Hemoglobin 10.7 (*)    HCT 30.4 (*)    Platelets 144 (*)    All other components within normal limits  COMPREHENSIVE METABOLIC PANEL - Abnormal; Notable for the following:    Sodium 130 (*)    Potassium 3.1 (*)    Chloride 93 (*)    Glucose, Bld 106 (*)    BUN 26 (*)    Albumin 3.0 (*)    GFR calc non Af Amer 49 (*)    GFR calc Af Amer 57 (*)    All other components within normal limits  URINALYSIS, ROUTINE W REFLEX MICROSCOPIC - Abnormal; Notable for the following:    Color, Urine AMBER (*)    APPearance CLOUDY (*)    Hgb urine dipstick SMALL (*)    Bilirubin Urine SMALL (*)    Nitrite POSITIVE (*)    Leukocytes, UA SMALL (*)    All other components within normal limits  LIPASE, BLOOD - Abnormal; Notable for the following:    Lipase 72 (*)    All other components within normal limits  PROTIME-INR - Abnormal; Notable for the following:    Prothrombin  Time 27.4 (*)    INR 2.71 (*)    All other components within normal limits  URINE MICROSCOPIC-ADD ON - Abnormal; Notable for the following:    Squamous Epithelial / LPF FEW (*)    Bacteria, UA MANY (*)    Casts HYALINE CASTS (*)    All other components within normal limits  CULTURE, BLOOD (ROUTINE X 2)  CULTURE,  BLOOD (ROUTINE X 2)  URINE CULTURE  PROCALCITONIN  CG4 I-STAT (LACTIC ACID)  POCT I-STAT TROPONIN I   Dg Chest 2 View  06/29/2012  *RADIOLOGY REPORT*  Clinical Data: Weakness  CHEST - 2 VIEW  Comparison: 03/08/2012  Findings: Moderate cardiac enlargement.  The patient is status post median sternotomy CABG procedure.  There is no airspace consolidation. Multilevel spondylosis identified within the thoracic spine.  IMPRESSION:  1.  Cardiac enlargement without heart failure.   Original Report Authenticated By: Signa Kell, M.D.    ECG:  Date: 06/29/2012  Rate: 89  Rhythm: atrial fibrillation  QRS Axis: normal  Intervals: n/a  ST/T Wave abnormalities: nonspecific T wave changes and ST depressions laterally  Conduction Disutrbances:none  Narrative Interpretation: Patient with inverted T waves  in anterolateral leads new from previous ECG and ST depression also in anterolateral leads unchanged from previous ECG, inferior lead T-wave inversion is unchanged  Old EKG Reviewed: changes noted    1. UTI (lower urinary tract infection)   2. Generalized weakness   3. Nausea & vomiting   4. Hyponatremia   5. Hypokalemia       MDM  [name] presents for generalized weakness and N/V/D.  is no neurologic deficit on my exam. Denying chest pain or shortness of breath. His ECG with minor changes from previous but no ST elevations noted. Patient with hypokalemia, hyponatremia mildly elevated lipase and concerning for pancreatitis and significant urinary tract infection. Is a troponin negative and lactic acid within normal limits.  Patient symptoms of fatigue generalized weakness and vomiting are secondary to her urinary tract infection. I discussed the patient with her primary care physician Dr. Sharyn Lull is in agreement for the patient to be discharged home. He'll see her in the office for further evaluation tomorrow.  Pt given fluid bolus, magnesium 2g and Potassium 40mg .  patient is without  abdominal pain, alert and oriented, NAD, nontoxic, nonseptic appearing. Her vital signs are stable she is afebrile. Patient is tolerating fluids given 6 ounces without difficulty here in the department. She ambulates without difficulty.  I discussed all these findings with the patient and her family.  I have also discussed reasons to return immediately to the ER.  Patient expresses understanding and agrees with plan.  I personally reviewed the imaging tests through PACS system.  I reviewed available ER/hospitalization records through the EMR.    Dr. Eber Hong was consulted, evaluated this patient with me and agrees with the plan.         Dahlia Client Veronnica Hennings, PA-C 06/29/12 2106  Dierdre Forth, PA-C 06/29/12 2116

## 2012-06-29 NOTE — ED Notes (Addendum)
Per patients daughter, "She was here a few months ago for infection around her heart and sepsis. Dc'd home the week of Thanksgiving with 6 weeks of IV abx."   One episode of emesis and one episode of diarrhea 06/23/12. Resolved throughout the week. Yesterday evening had nausea with emesis. Persistent today.

## 2012-06-29 NOTE — ED Notes (Signed)
Patient transported to X-ray 

## 2012-06-30 ENCOUNTER — Telehealth (HOSPITAL_COMMUNITY): Payer: Self-pay | Admitting: Emergency Medicine

## 2012-06-30 NOTE — ED Provider Notes (Signed)
Medical screening examination/treatment/procedure(s) were performed by non-physician practitioner and as supervising physician I was immediately available for consultation/collaboration.    Vida Roller, MD 06/30/12 [phone]

## 2012-06-30 NOTE — ED Notes (Signed)
[facility] called about +Blood culture. Chart sent to EDP office for review.

## 2012-07-01 LAB — URINE CULTURE

## 2012-07-02 ENCOUNTER — Telehealth (HOSPITAL_COMMUNITY): Payer: Self-pay | Admitting: Emergency Medicine

## 2012-07-02 NOTE — ED Notes (Signed)
+  Urine. Patient treated with Keflex. Sensitive to same. Per protocol MD. °

## 2012-07-02 NOTE — ED Notes (Signed)
Patient has +Urine culture. °

## 2012-07-03 LAB — CULTURE, BLOOD (ROUTINE X 2)

## 2012-07-04 ENCOUNTER — Telehealth: Payer: Self-pay | Admitting: Critical Care Medicine

## 2012-07-04 NOTE — Telephone Encounter (Signed)
lmomtcb x1 

## 2012-07-07 NOTE — Telephone Encounter (Signed)
I suggest [facility] and [employer]

## 2012-07-07 NOTE — Telephone Encounter (Signed)
Pt's daughter would like PW to recommend a PCP that specializes in geriatrics for pt to start seeing.  PT has not had a PCP in several years and has been having frequent uti's and other problems that they think can be monitored by a PCP.  Please advise.

## 2012-07-07 NOTE — Telephone Encounter (Signed)
Spoke with Zella Ball and notified of recs per PW She verbalized understanding and states nothing further needed

## 2012-07-10 ENCOUNTER — Telehealth (HOSPITAL_COMMUNITY): Payer: Self-pay | Admitting: Emergency Medicine

## 2012-07-11 ENCOUNTER — Inpatient Hospital Stay (HOSPITAL_COMMUNITY): Payer: Medicare Other

## 2012-07-11 ENCOUNTER — Emergency Department (HOSPITAL_COMMUNITY): Payer: Medicare Other

## 2012-07-11 ENCOUNTER — Inpatient Hospital Stay (HOSPITAL_COMMUNITY)
Admission: EM | Admit: 2012-07-11 | Discharge: 2012-07-22 | DRG: 291 | Disposition: A | Discharge status: Home / Self Care | Payer: Medicare Other | Attending: Cardiovascular Disease | Admitting: Cardiovascular Disease

## 2012-07-11 ENCOUNTER — Encounter (HOSPITAL_COMMUNITY): Payer: Self-pay | Admitting: *Deleted

## 2012-07-11 DIAGNOSIS — B952 Enterococcus as the cause of diseases classified elsewhere: Secondary | ICD-10-CM

## 2012-07-11 DIAGNOSIS — E785 Hyperlipidemia, unspecified: Secondary | ICD-10-CM | POA: Diagnosis present

## 2012-07-11 DIAGNOSIS — I129 Hypertensive chronic kidney disease with stage 1 through stage 4 chronic kidney disease, or unspecified chronic kidney disease: Secondary | ICD-10-CM | POA: Diagnosis present

## 2012-07-11 DIAGNOSIS — K219 Gastro-esophageal reflux disease without esophagitis: Secondary | ICD-10-CM | POA: Diagnosis present

## 2012-07-11 DIAGNOSIS — E876 Hypokalemia: Secondary | ICD-10-CM | POA: Diagnosis not present

## 2012-07-11 DIAGNOSIS — R21 Rash and other nonspecific skin eruption: Secondary | ICD-10-CM

## 2012-07-11 DIAGNOSIS — M069 Rheumatoid arthritis, unspecified: Secondary | ICD-10-CM | POA: Diagnosis present

## 2012-07-11 DIAGNOSIS — M329 Systemic lupus erythematosus, unspecified: Secondary | ICD-10-CM | POA: Diagnosis present

## 2012-07-11 DIAGNOSIS — T826XXA Infection and inflammatory reaction due to cardiac valve prosthesis, initial encounter: Secondary | ICD-10-CM

## 2012-07-11 DIAGNOSIS — R8271 Bacteriuria: Secondary | ICD-10-CM

## 2012-07-11 DIAGNOSIS — R0602 Shortness of breath: Secondary | ICD-10-CM

## 2012-07-11 DIAGNOSIS — Z7901 Long term (current) use of anticoagulants: Secondary | ICD-10-CM

## 2012-07-11 DIAGNOSIS — I330 Acute and subacute infective endocarditis: Secondary | ICD-10-CM

## 2012-07-11 DIAGNOSIS — I2789 Other specified pulmonary heart diseases: Secondary | ICD-10-CM | POA: Diagnosis present

## 2012-07-11 DIAGNOSIS — I359 Nonrheumatic aortic valve disorder, unspecified: Secondary | ICD-10-CM | POA: Diagnosis present

## 2012-07-11 DIAGNOSIS — N289 Disorder of kidney and ureter, unspecified: Secondary | ICD-10-CM

## 2012-07-11 DIAGNOSIS — Z954 Presence of other heart-valve replacement: Secondary | ICD-10-CM

## 2012-07-11 DIAGNOSIS — J8410 Pulmonary fibrosis, unspecified: Secondary | ICD-10-CM

## 2012-07-11 DIAGNOSIS — E7800 Pure hypercholesterolemia, unspecified: Secondary | ICD-10-CM | POA: Diagnosis present

## 2012-07-11 DIAGNOSIS — N182 Chronic kidney disease, stage 2 (mild): Secondary | ICD-10-CM | POA: Diagnosis present

## 2012-07-11 DIAGNOSIS — I2510 Atherosclerotic heart disease of native coronary artery without angina pectoris: Secondary | ICD-10-CM | POA: Diagnosis present

## 2012-07-11 DIAGNOSIS — Z951 Presence of aortocoronary bypass graft: Secondary | ICD-10-CM

## 2012-07-11 DIAGNOSIS — N179 Acute kidney failure, unspecified: Secondary | ICD-10-CM | POA: Diagnosis not present

## 2012-07-11 DIAGNOSIS — R7881 Bacteremia: Secondary | ICD-10-CM

## 2012-07-11 DIAGNOSIS — I38 Endocarditis, valve unspecified: Secondary | ICD-10-CM

## 2012-07-11 DIAGNOSIS — I5021 Acute systolic (congestive) heart failure: Principal | ICD-10-CM | POA: Diagnosis present

## 2012-07-11 DIAGNOSIS — I509 Heart failure, unspecified: Secondary | ICD-10-CM

## 2012-07-11 DIAGNOSIS — I4891 Unspecified atrial fibrillation: Secondary | ICD-10-CM

## 2012-07-11 LAB — CBC WITH DIFFERENTIAL/PLATELET
Basophils Absolute: 0 10*3/uL (ref 0.0–0.1)
Basophils Relative: 0 % (ref 0–1)
Eosinophils Relative: 0 % (ref 0–5)
HCT: 27.8 % — ABNORMAL LOW (ref 36.0–46.0)
MCHC: 34.5 g/dL (ref 30.0–36.0)
MCV: 89.1 fL (ref 78.0–100.0)
Monocytes Absolute: 0.9 10*3/uL (ref 0.1–1.0)
Platelets: 177 10*3/uL (ref 150–400)
RDW: 14.3 % (ref 11.5–15.5)
WBC: 10 10*3/uL (ref 4.0–10.5)

## 2012-07-11 LAB — COMPREHENSIVE METABOLIC PANEL
ALT: 9 U/L (ref 0–35)
AST: 25 U/L (ref 0–37)
Albumin: 2.4 g/dL — ABNORMAL LOW (ref 3.5–5.2)
Calcium: 8.7 mg/dL (ref 8.4–10.5)
Creatinine, Ser: 1.06 mg/dL (ref 0.50–1.10)
GFR calc non Af Amer: 50 mL/min — ABNORMAL LOW (ref >90)
Sodium: 128 mEq/L — ABNORMAL LOW (ref 135–145)
Total Protein: 7.1 g/dL (ref 6.0–8.3)

## 2012-07-11 LAB — URINALYSIS, ROUTINE W REFLEX MICROSCOPIC
Ketones, ur: NEGATIVE mg/dL
Nitrite: NEGATIVE
Specific Gravity, Urine: 1.019 (ref 1.005–1.030)
pH: 5.5 (ref 5.0–8.0)

## 2012-07-11 LAB — URINE MICROSCOPIC-ADD ON

## 2012-07-11 LAB — CG4 I-STAT (LACTIC ACID): Lactic Acid, Venous: 1.93 mmol/L (ref 0.5–2.2)

## 2012-07-11 LAB — LIPASE, BLOOD: Lipase: 49 U/L (ref 11–59)

## 2012-07-11 LAB — POCT I-STAT TROPONIN I: Troponin i, poc: 0.03 ng/mL (ref 0.00–0.08)

## 2012-07-11 MED ORDER — FUROSEMIDE 10 MG/ML IJ SOLN: 40.0000 mg | Freq: Once | INTRAMUSCULAR | Status: DC

## 2012-07-11 MED ORDER — IOHEXOL 350 MG/ML SOLN
100.0000 mL | Freq: Once | INTRAVENOUS | Status: AC | PRN
  Administered 2012-07-11: 100 mL via INTRAVENOUS

## 2012-07-11 NOTE — ED Provider Notes (Signed)
History     CSN: [id number]  Arrival date & time 07/11/12  1831   First MD Initiated Contact with Patient 07/11/12 1951      Chief Complaint  Patient presents with  . Emesis  . Weakness    (Consider location/radiation/quality/duration/timing/severity/associated sxs/prior treatment) HPI Comments: 50 y F with PMH of CAD, prior a-fib, GERD, HLD, pulmonary fibrosis, endocarditis, lupus (on plaquenil and 5 mg daily steroids) and HTN that presents with c/o SOB, weakness and NB/NB emesis x2 today.  She was seen here on 4/6 and diagnosed with a UTI.  Culture from that visit showed K.Pneumoniae that was sensitive to cephalosporins.  Her and her family endorse that she complete 9 of 10 days of Keflex and that she had gotten better prior to today when she began not feeling well again.  No CP, diarrhea, abd pain.  + chills on ROS.  Patient is a 72 y.o. female presenting with shortness of breath. The history is provided by the patient and a relative.  Shortness of Breath Severity:  Moderate Onset quality:  Gradual Duration:  2 days Timing:  Constant Progression:  Worsening Chronicity:  New Context: activity   Relieved by:  Nothing Associated symptoms: vomiting (x2 today, "water")   Associated symptoms: no chest pain, no diaphoresis, no fever, no headaches, no rash and no syncope     Past Medical History  Diagnosis Date  . Coronary artery disease   . Atrial fibrillation     Now NSR  . GERD (gastroesophageal reflux disease)   . Hyperlipidemia   . 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 Anemia   . Angina   . Shortness of breath     with activity  . Hypertension   . Blood transfusion   . Pneumonia   . H/O hiatal hernia   . Lupus     Past Surgical History  Procedure Laterality Date  . Cardiac catheterization    . Cardiac valve replacement      2006  . Coronary artery bypass graft    . Givens capsule study  04/09/2011    Procedure: GIVENS CAPSULE STUDY;   Surgeon: Theda Belfast, MD;  Location: [facility] ENDOSCOPY;  Service: Endoscopy;  Laterality: N/A;  . Tee without cardioversion  02/06/2012    Procedure: TRANSESOPHAGEAL ECHOCARDIOGRAM (TEE);  Surgeon: Ricki Rodriguez, MD;  Location: [facility] ENDOSCOPY;  Service: Cardiovascular;  Laterality: N/A;  . Esophagogastroduodenoscopy  02/12/2012    Procedure: ESOPHAGOGASTRODUODENOSCOPY (EGD);  Surgeon: Theda Belfast, MD;  Location: [facility] ENDOSCOPY;  Service: Endoscopy;  Laterality: N/A;    Family History  Problem Relation Age of Onset  . Heart disease Mother     CHF  . Mental illness Father     suicide  . Cancer Sister     pancreatic cancer  . COPD Brother     History  Substance Use Topics  . Smoking status: Never Smoker   . Smokeless tobacco: Never Used  . Alcohol Use: No    OB History   Grav Para Term Preterm Abortions TAB SAB Ect Mult Living                  Review of Systems  Constitutional: Positive for chills. Negative for fever and diaphoresis.  HENT: Negative for rhinorrhea and trouble swallowing.   Respiratory: Positive for shortness of breath.   Cardiovascular: Negative for chest pain and syncope.  Gastrointestinal: Positive for vomiting (x2 today, "water"). Negative for  diarrhea.  Genitourinary: Negative for dysuria.  Skin: Negative for rash.  Neurological: Negative for dizziness, light-headedness and headaches.  All other systems reviewed and are negative.    Allergies  Codeine  Home Medications   Current Outpatient Rx  Name  Route  Sig  Dispense  Refill  . hydroxychloroquine (PLAQUENIL) 200 MG tablet   Oral   Take 200 mg by mouth 2 (two) times daily.          Marland Kitchen ibuprofen (ADVIL,MOTRIN) 200 MG tablet   Oral   Take 400 mg by mouth at bedtime.         . metolazone (ZAROXOLYN) 2.5 MG tablet   Oral   Take 1 tablet (2.5 mg total) by mouth daily.   30 tablet   3   . metoprolol (LOPRESSOR) 50 MG tablet   Oral   Take 1 tablet (50 mg total) by mouth 2 (two) times  daily.   60 tablet   3   . pantoprazole (PROTONIX) 40 MG tablet      TAKE 1 TABLET BY MOUTH EVERY MORNING   30 tablet   0   . potassium chloride SA (KLOR-CON M20) 20 MEQ tablet   Oral   Take 2 tablets (40 mEq total) by mouth 2 (two) times daily.   120 tablet   3   . predniSONE (DELTASONE) 5 MG tablet   Oral   Take 5 mg by mouth every morning.          Marland Kitchen rOPINIRole (REQUIP) 1 MG tablet   Oral   Take 1 mg by mouth at bedtime. Daily at bedtime         . rosuvastatin (CRESTOR) 10 MG tablet   Oral   Take 10 mg by mouth daily.           Marland Kitchen sulfaSALAzine (AZULFIDINE) 500 MG tablet   Oral   Take 1,000 mg by mouth 2 (two) times daily.          Marland Kitchen warfarin (COUMADIN) 4 MG tablet   Oral   Take 4 mg by mouth See admin instructions. Take everyday except Sunday           BP 102/59  Pulse 103  Temp(Src) 98.5 F (36.9 C) (Oral)  Resp 29  Ht 5\' 7"  (1.702 m)  SpO2 99%  Physical Exam  Vitals reviewed. Constitutional: She is oriented to person, place, and time. She appears well-developed and well-nourished.  HENT:  Right Ear: External ear normal.  Left Ear: External ear normal.  Mouth/Throat: No oropharyngeal exudate.  Eyes: Conjunctivae and EOM are normal. Pupils are equal, round, and reactive to light.  Neck: Normal range of motion. Neck supple.  Cardiovascular: Normal rate, regular rhythm and intact distal pulses.  Exam reveals no gallop and no friction rub.   Murmur (2/6 SEM) heard. Pulmonary/Chest: Effort normal and breath sounds normal.  Abdominal: Soft. Bowel sounds are normal. She exhibits no distension. There is no tenderness.  Musculoskeletal: Normal range of motion. She exhibits edema.  Neurological: She is alert and oriented to person, place, and time. No cranial nerve deficit.  Skin: Skin is warm and dry. No rash noted.  Psychiatric: She has a normal mood and affect.    ED Course  Procedures (including critical care time)  Labs Reviewed  CBC WITH  DIFFERENTIAL - Abnormal; Notable for the following:    RBC 3.12 (*)    Hemoglobin 9.6 (*)    HCT 27.8 (*)    All  other components within normal limits  COMPREHENSIVE METABOLIC PANEL - Abnormal; Notable for the following:    Sodium 128 (*)    Chloride 94 (*)    Albumin 2.4 (*)    GFR calc non Af Amer 50 (*)    GFR calc Af Amer 58 (*)    All other components within normal limits  PRO B NATRIURETIC PEPTIDE - Abnormal; Notable for the following:    Pro B Natriuretic peptide (BNP) 12830.0 (*)    All other components within normal limits  URINALYSIS, ROUTINE W REFLEX MICROSCOPIC - Abnormal; Notable for the following:    Color, Urine AMBER (*)    APPearance CLOUDY (*)    Hgb urine dipstick TRACE (*)    Bilirubin Urine SMALL (*)    Protein, ur 30 (*)    Leukocytes, UA TRACE (*)    All other components within normal limits  URINE MICROSCOPIC-ADD ON - Abnormal; Notable for the following:    Squamous Epithelial / LPF MANY (*)    Bacteria, UA MANY (*)    Casts GRANULAR CAST (*)    All other components within normal limits  LIPASE, BLOOD  CG4 I-STAT (LACTIC ACID)  POCT I-STAT TROPONIN I   Dg Chest 2 View  07/12/2012  *RADIOLOGY REPORT*  Clinical Data: Shortness of breath, weakness, vomiting, history coronary disease, atrial fibrillation, pulmonary fibrosis, hypertension, lupus  CHEST - 2 VIEW  Comparison: 06/29/2012  Findings: Enlargement of cardiac silhouette post median sternotomy. Pulmonary vascular congestion. Mild right basilar atelectasis. Question minimal perihilar edema. No gross segmental consolidation, pleural effusion or pneumothorax. Left glenohumeral degenerative changes.  IMPRESSION: Enlargement of cardiac silhouette with pulmonary vascular congestion and question mild pulmonary edema. Right basilar atelectasis.   Original Report Authenticated By: Ulyses Southward, M.D.    Ct Head Wo Contrast  07/11/2012  *RADIOLOGY REPORT*  Clinical Data: Weakness, vomiting  CT HEAD WITHOUT CONTRAST   Technique:  Contiguous axial images were obtained from the base of the skull through the vertex without contrast.  Comparison: None.  Findings: No evidence of parenchymal hemorrhage or extra-axial fluid collection.  High density in the right caudate head (series 2/image 15), favored to reflect mineralization rather than hemorrhage given the lack of associated vasogenic edema.  No mass lesion, mass effect, or midline shift.  No CT evidence of acute infarction.  Old right parietal infarct (series 2/image 20). Old left frontal infarct (series 2/image 17).  Subcortical mineralization in the right occipital lobe (series 2/image 15), likely reflecting prior laminar necrosis.  Old left thalamic lacunar infarct (series 2/image 15).  Scattered cortical calcifications (for example, series 2/image 16), chronic.  Extensive subcortical white matter and periventricular small vessel ischemic changes.  Global cortical atrophy.  No ventriculomegaly.  The visualized paranasal sinuses are essentially clear. The mastoid air cells are unopacified.  No evidence of calvarial fracture.  IMPRESSION: No evidence of acute intracranial abnormality.  Multiple prior infarcts, as described above.  Atrophy with small vessel ischemic changes and intracranial atherosclerosis.  Suspected asymmetric mineralization in the right caudate head.   Original Report Authenticated By: Charline Bills, M.D.    Ct Angio Chest Pe W/cm &/or Wo Cm  07/12/2012  *RADIOLOGY REPORT*  Clinical Data: Tachypnea; vomiting.  Weakness.  History of pulmonary fibrosis.  CT ANGIOGRAPHY CHEST  Technique:  Multidetector CT imaging of the chest using the standard protocol during bolus administration of intravenous contrast. Multiplanar reconstructed images including MIPs were obtained and reviewed to evaluate the vascular anatomy.  Contrast:  OMNIPAQUE IOHEXOL 350 MG/ML SOLN  Comparison: Chest radiograph performed 06/29/2012, and CTA of the chest performed 02/03/2012   Findings: There is no evidence of significant pulmonary embolus.  Diffuse interstitial prominence is noted, with hazy opacity throughout much of both lungs.  Underlying areas of focal airspace opacity appear grossly stable and may reflect the patient's chronic lung disease.  Findings are most compatible with pulmonary edema superimposed on the patient's chronic findings.  Minimal scattered blebs are seen.  No significant pleural effusion or pneumothorax is identified.  The mediastinum is mildly enlarged, with mild biatrial enlargement. Prominence of the pulmonary outflow tract and both pulmonary arteries appears grossly stable, and may reflect pulmonary arterial hypertension.  Mildly enlarged right paratracheal, precarinal and subcarinal nodes are seen, measuring up to 1.3 cm in short axis. These may reflect the patient's lupus.  No pericardial effusion is identified.  Diffuse coronary artery calcifications are seen.  The patient is status post median sternotomy.  The great vessels are grossly unremarkable in appearance.  No axillary lymphadenopathy is seen.  The thyroid gland is unremarkable in appearance.  The visualized portions of the liver are unremarkable, aside from a small calcification at the hepatic hilum.  Splenomegaly is again noted, with the spleen measuring 15.9 cm in length.  A large stone is noted within the gallbladder; the gallbladder is otherwise unremarkable in appearance.  Nonspecific perinephric stranding is noted bilaterally.  A tiny hiatal hernia is seen.  There is diffuse calcification along the splenic artery.  The visualized portions of the pancreas are grossly unremarkable.  No acute osseous abnormalities are seen.  IMPRESSION:  1.  No evidence of significant pulmonary embolus. 2.  Diffuse interstitial prominence, with hazy opacity throughout much of both lungs, concerning for pulmonary edema. 3.  Underlying areas of focal airspace opacity appear grossly stable and may reflect the  patient's underlying chronic lung disease.  Minimal scattered blebs seen. 4.  Mild cardiomegaly, with mild biatrial enlargement.  Stable prominence of the pulmonary outflow tract and both pulmonary arteries, raising concern for pulmonary arterial hypertension. 5.  Mildly enlarged right paratracheal, precarinal and subcarinal nodes, measuring up to 1.3 cm in short axis.  These may reflect the patient's lupus, and are only mildly changed in appearance from the prior study. 6.  Splenomegaly noted. 7.  Cholelithiasis; gallbladder otherwise unremarkable in appearance. 8.  Tiny hiatal hernia seen.   Original Report Authenticated By: Tonia Ghent, M.D.    Date: 07/12/2012  Rate: 109  Rhythm: atrial fibrillation  QRS Axis: normal  Intervals: QT prolonged  ST/T Wave abnormalities: ST depression in inferior and anterolateral leads  Conduction Disutrbances:none  Narrative Interpretation: Atrial fibrillation with ST depression and prolonged QTC. Old EKG Reviewed: When compared with ECG of 06/29/2012 the rate has increased and QT interval is unchanged but QTC has increased because of change in rate.     1. SOB (shortness of breath)   2. Heart failure       MDM   23 y F with PMH of CAD, prior a-fib, GERD, HLD, pulmonary fibrosis, endocarditis, lupus (on plaquenil and 5 mg daily steroids) and HTN that presents with c/o SOB, weakness and NB/NB emesis x2 today.  She was seen here on 4/6 and diagnosed with a UTI.  Culture from that visit showed K.Pneumoniae that was sensitive to cephalosporins.  Her and her family endorse that she complete 9 of 10 days of Keflex and that she had gotten better prior to today when she began not  feeling well again.  No CP, diarrhea, abd pain.  + chills on ROS.  AF, HR in 90s, tachypneic to 30s on 2L [place].  BPs 90s/60s.  Lungs diminished in bases.  Abd soft, NT.  2/6 SEM.  Mild LE edema.  Bedside echo with no effusion, good cardiac function, RV noted to be slightly dilated.  Diff Dx:  CHF, ACS, PNA, PE, Lupus flare, SDH, Valvular insufficiency, Pulm HTN.  CBC, CMP, bnp, lactate, urine studies, CT head.  11:36 PM bnp 13830.  VSS.  CT PE pending.  Cardiology consulted for admission for likely new onset CHF.  12:30 AM CT-PE negative for PE.  Prominence noted of pulmonary arteries on CT and in setting of dilated RV on bedside echo, concern for pulm HTN.  Admission to ICU pending.  Disposition: Admit  Condition: Stable  Pt seen in conjunction with my attending, Dr. Preston Fleeting.  Oleh Genin, MD PGY-II [facility] Emergency Medicine Resident          Oleh Genin, MD 07/12/12 [phone]

## 2012-07-11 NOTE — ED Notes (Signed)
Tried to draw patient blood, I didn't have any success, nurse was informed.

## 2012-07-11 NOTE — H&P (Signed)
[name] is an 72 y.o. female.   Chief Complaint: Shortness of breath and weakness. HPI: 72 years old female with 1 day history of shortness of breath and significant weakness. Recently treated for UTI. 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  Past Medical History  Diagnosis Date  . Coronary artery disease   . Atrial fibrillation     Now NSR  . GERD (gastroesophageal reflux disease)   . Hyperlipidemia   . 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 Anemia   . Angina   . Shortness of breath     with activity  . Hypertension   . Blood transfusion   . Pneumonia   . H/O hiatal hernia   . Lupus       Past Surgical History  Procedure Laterality Date  . Cardiac catheterization    . Cardiac valve replacement      2006  . Coronary artery bypass graft    . Givens capsule study  04/09/2011    Procedure: GIVENS CAPSULE STUDY;  Surgeon: Theda Belfast, MD;  Location: [facility] ENDOSCOPY;  Service: Endoscopy;  Laterality: N/A;  . Tee without cardioversion  02/06/2012    Procedure: TRANSESOPHAGEAL ECHOCARDIOGRAM (TEE);  Surgeon: Ricki Rodriguez, MD;  Location: [facility] ENDOSCOPY;  Service: Cardiovascular;  Laterality: N/A;  . Esophagogastroduodenoscopy  02/12/2012    Procedure: ESOPHAGOGASTRODUODENOSCOPY (EGD);  Surgeon: Theda Belfast, MD;  Location: [facility] ENDOSCOPY;  Service: Endoscopy;  Laterality: N/A;    Family History  Problem Relation Age of Onset  . Heart disease Mother     CHF  . Mental illness Father     suicide  . Cancer Sister     pancreatic cancer  . COPD Brother    Social History:  reports that she has never smoked. She has never used smokeless tobacco. She reports that she does not drink alcohol. Her drug history is not on file.  Allergies:  Allergies  Allergen Reactions  . Codeine Nausea Only     (Not in a hospital admission)  Results for orders placed during the hospital encounter of 07/11/12 (from the  past 48 hour(s))  CBC WITH DIFFERENTIAL     Status: Abnormal   Collection Time    07/11/12  7:01 PM      Result Value Range   WBC 10.0  4.0 - 10.5 K/uL   RBC 3.12 (*) 3.87 - 5.11 MIL/uL   Hemoglobin 9.6 (*) 12.0 - 15.0 g/dL   HCT 16.1 (*) 09.6 - 04.5 %   MCV 89.1  78.0 - 100.0 fL   MCH 30.8  26.0 - 34.0 pg   MCHC 34.5  30.0 - 36.0 g/dL   RDW 40.9  81.1 - 91.4 %   Platelets 177  150 - 400 K/uL   Neutrophils Relative 76  43 - 77 %   Neutro Abs 7.6  1.7 - 7.7 K/uL   Lymphocytes Relative 15  12 - 46 %   Lymphs Abs 1.5  0.7 - 4.0 K/uL   Monocytes Relative 9  3 - 12 %   Monocytes Absolute 0.9  0.1 - 1.0 K/uL   Eosinophils Relative 0  0 - 5 %   Eosinophils Absolute 0.0  0.0 - 0.7 K/uL   Basophils Relative 0  0 - 1 %   Basophils Absolute 0.0  0.0 - 0.1 K/uL  COMPREHENSIVE METABOLIC PANEL     Status: Abnormal  Collection Time    07/11/12  7:01 PM      Result Value Range   Sodium 128 (*) 135 - 145 mEq/L   Potassium 3.5  3.5 - 5.1 mEq/L   Chloride 94 (*) 96 - 112 mEq/L   CO2 20  19 - 32 mEq/L   Glucose, Bld 87  70 - 99 mg/dL   BUN 20  6 - 23 mg/dL   Creatinine, Ser 1.61  0.50 - 1.10 mg/dL   Calcium 8.7  8.4 - 09.6 mg/dL   Total Protein 7.1  6.0 - 8.3 g/dL   Albumin 2.4 (*) 3.5 - 5.2 g/dL   AST 25  0 - 37 U/L   ALT 9  0 - 35 U/L   Alkaline Phosphatase 96  39 - 117 U/L   Total Bilirubin 0.9  0.3 - 1.2 mg/dL   GFR calc non Af Amer 50 (*) >90 mL/min   GFR calc Af Amer 58 (*) >90 mL/min   Comment:            The eGFR has been calculated     using the CKD EPI equation.     This calculation has not been     validated in all clinical     situations.     eGFR's persistently     <90 mL/min signify     possible Chronic Kidney Disease.  PRO B NATRIURETIC PEPTIDE     Status: Abnormal   Collection Time    07/11/12  7:01 PM      Result Value Range   Pro B Natriuretic peptide (BNP) 12830.0 (*) 0 - 125 pg/mL  LIPASE, BLOOD     Status: None   Collection Time    07/11/12  7:01 PM       Result Value Range   Lipase 49  11 - 59 U/L  POCT I-STAT TROPONIN I     Status: None   Collection Time    07/11/12  8:14 PM      Result Value Range   Troponin i, poc 0.03  0.00 - 0.08 ng/mL   Comment 3            Comment: Due to the release kinetics of cTnI,     a negative result within the first hours     of the onset of symptoms does not rule out     myocardial infarction with certainty.     If myocardial infarction is still suspected,     repeat the test at appropriate intervals.  CG4 I-STAT (LACTIC ACID)     Status: None   Collection Time    07/11/12  8:16 PM      Result Value Range   Lactic Acid, Venous 1.93  0.5 - 2.2 mmol/L  URINALYSIS, ROUTINE W REFLEX MICROSCOPIC     Status: Abnormal   Collection Time    07/11/12 10:45 PM      Result Value Range   Color, Urine AMBER (*) YELLOW   Comment: BIOCHEMICALS MAY BE AFFECTED BY COLOR   APPearance CLOUDY (*) CLEAR   Specific Gravity, Urine 1.019  1.005 - 1.030   pH 5.5  5.0 - 8.0   Glucose, UA NEGATIVE  NEGATIVE mg/dL   Hgb urine dipstick TRACE (*) NEGATIVE   Bilirubin Urine SMALL (*) NEGATIVE   Ketones, ur NEGATIVE  NEGATIVE mg/dL   Protein, ur 30 (*) NEGATIVE mg/dL   Urobilinogen, UA 1.0  0.0 - 1.0 mg/dL   Nitrite  NEGATIVE  NEGATIVE   Leukocytes, UA TRACE (*) NEGATIVE  URINE MICROSCOPIC-ADD ON     Status: Abnormal   Collection Time    07/11/12 10:45 PM      Result Value Range   Squamous Epithelial / LPF MANY (*) RARE   WBC, UA 0-2  <3 WBC/hpf   RBC / HPF 7-10  <3 RBC/hpf   Bacteria, UA MANY (*) RARE   Casts GRANULAR CAST (*) NEGATIVE   Comment: HYALINE CASTS   Ct Head Wo Contrast  07/11/2012  *RADIOLOGY REPORT*  Clinical Data: Weakness, vomiting  CT HEAD WITHOUT CONTRAST  Technique:  Contiguous axial images were obtained from the base of the skull through the vertex without contrast.  Comparison: None.  Findings: No evidence of parenchymal hemorrhage or extra-axial fluid collection.  High density in the right caudate  head (series 2/image 15), favored to reflect mineralization rather than hemorrhage given the lack of associated vasogenic edema.  No mass lesion, mass effect, or midline shift.  No CT evidence of acute infarction.  Old right parietal infarct (series 2/image 20). Old left frontal infarct (series 2/image 17).  Subcortical mineralization in the right occipital lobe (series 2/image 15), likely reflecting prior laminar necrosis.  Old left thalamic lacunar infarct (series 2/image 15).  Scattered cortical calcifications (for example, series 2/image 16), chronic.  Extensive subcortical white matter and periventricular small vessel ischemic changes.  Global cortical atrophy.  No ventriculomegaly.  The visualized paranasal sinuses are essentially clear. The mastoid air cells are unopacified.  No evidence of calvarial fracture.  IMPRESSION: No evidence of acute intracranial abnormality.  Multiple prior infarcts, as described above.  Atrophy with small vessel ischemic changes and intracranial atherosclerosis.  Suspected asymmetric mineralization in the right caudate head.   Original Report Authenticated By: Charline Bills, M.D.    Review of Systems  Constitutional: Negative for fever.  Eyes: Negative for blurred vision and double vision.  Respiratory: Negative for cough, hemoptysis, sputum production and shortness of breath.  Cardiovascular: Positive for leg swelling. Negative for chest pain, palpitations and orthopnea.  Gastrointestinal: Negative for nausea, vomiting and abdominal pain.  Genitourinary: Negative for dysuria.  Neurological: Negative for dizziness and headaches.   Blood pressure 103/64, pulse 91, temperature 98.5 F (36.9 C), temperature source Oral, resp. rate 30, height 5\' 6"  (1.676 m), weight 75 kg (165 lb 5.5 oz), SpO2 95.00%. Physical Exam  Constitutional: She is oriented to person, place, and time. She appears well-developed and well-nourished.  HENT: Normocephalic and atraumatic. No  oropharyngeal exudate. Eyes: Conjunctivae normal are normal. Pupils are equal, round, and reactive to light. No scleral icterus.  Neck: Normal range of motion. Neck supple. No JVD present. No tracheal deviation present. No thyromegaly present.  Cardiovascular: Irregularly irregular S1-S2 soft soft systolic murmur noted no S3 gallop  Respiratory: Effort normal and breath sounds normal. +ve respiratory distress. She has no wheezes. She has no rales.  GI: Soft. Bowel sounds are normal. She exhibits no distension. There is no tenderness. There is no rebound.  Musculoskeletal: No clubbing or cyanosis. 1+ edema lower extremity. Neurological: She is alert and oriented to person, place, and time.    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 CT chest pending.  Itali Mckendry S 07/11/2012, 11:27 PM

## 2012-07-11 NOTE — ED Notes (Signed)
Pt was d/c'd from [facility] 2 weeks ago Sunday after tx for UTI.  Today was last day of antibiotics for UTI, but she was unable to take any d/t emesis.  Pt states that since this am she is barely able to stand d/t weakness.

## 2012-07-12 LAB — GLUCOSE, CAPILLARY
Glucose-Capillary: 79 mg/dL (ref 70–99)
Glucose-Capillary: 93 mg/dL (ref 70–99)

## 2012-07-12 LAB — PROTIME-INR
INR: 3.07 — ABNORMAL HIGH (ref 0.00–1.49)
Prothrombin Time: 30.1 seconds — ABNORMAL HIGH (ref 11.6–15.2)

## 2012-07-12 MED ORDER — DOBUTAMINE IN D5W 4-5 MG/ML-% IV SOLN
2.5000 ug/kg/min | INTRAVENOUS | Status: DC
  Administered 2012-07-12: 2.5 ug/kg/min via INTRAVENOUS
  Filled 2012-07-12: qty 250

## 2012-07-12 MED ORDER — ATORVASTATIN CALCIUM 10 MG PO TABS
10.0000 mg | ORAL_TABLET | Freq: Every day | ORAL | Status: DC
  Administered 2012-07-12 – 2012-07-21 (×10): 10 mg via ORAL
  Filled 2012-07-12 (×11): qty 1

## 2012-07-12 MED ORDER — DIGOXIN 0.25 MG/ML IJ SOLN
0.2500 mg | Freq: Once | INTRAMUSCULAR | Status: AC
  Administered 2012-07-12: 0.25 mg via INTRAVENOUS
  Filled 2012-07-12: qty 2

## 2012-07-12 MED ORDER — WARFARIN SODIUM 4 MG PO TABS
4.0000 mg | ORAL_TABLET | ORAL | Status: DC
  Administered 2012-07-12 – 2012-07-22 (×9): 4 mg via ORAL
  Filled 2012-07-12 (×9): qty 1

## 2012-07-12 MED ORDER — PREDNISONE 5 MG PO TABS
5.0000 mg | ORAL_TABLET | Freq: Every day | ORAL | Status: DC
  Administered 2012-07-12 – 2012-07-22 (×11): 5 mg via ORAL
  Filled 2012-07-12 (×13): qty 1

## 2012-07-12 MED ORDER — HYDROXYCHLOROQUINE SULFATE 200 MG PO TABS
200.0000 mg | ORAL_TABLET | Freq: Two times a day (BID) | ORAL | Status: DC
  Administered 2012-07-12 – 2012-07-22 (×21): 200 mg via ORAL
  Filled 2012-07-12 (×22): qty 1

## 2012-07-12 MED ORDER — METOPROLOL TARTRATE 12.5 MG HALF TABLET
12.5000 mg | ORAL_TABLET | Freq: Two times a day (BID) | ORAL | Status: DC
  Administered 2012-07-12 – 2012-07-22 (×20): 12.5 mg via ORAL
  Filled 2012-07-12 (×22): qty 1

## 2012-07-12 MED ORDER — SODIUM CHLORIDE 0.9 % IJ SOLN
3.0000 mL | Freq: Two times a day (BID) | INTRAMUSCULAR | Status: DC
  Administered 2012-07-12: 6 mL via INTRAVENOUS
  Administered 2012-07-12 – 2012-07-21 (×12): 3 mL via INTRAVENOUS

## 2012-07-12 MED ORDER — FUROSEMIDE 10 MG/ML IJ SOLN
40.0000 mg | Freq: Once | INTRAMUSCULAR | Status: AC
  Administered 2012-07-12: 40 mg via INTRAVENOUS
  Filled 2012-07-12: qty 4

## 2012-07-12 MED ORDER — IBUPROFEN 400 MG PO TABS
400.0000 mg | ORAL_TABLET | Freq: Every day | ORAL | Status: DC
  Administered 2012-07-12 – 2012-07-13 (×3): 400 mg via ORAL
  Filled 2012-07-12 (×4): qty 1

## 2012-07-12 MED ORDER — SULFASALAZINE 500 MG PO TABS
1000.0000 mg | ORAL_TABLET | Freq: Two times a day (BID) | ORAL | Status: DC
  Administered 2012-07-12 – 2012-07-22 (×21): 1000 mg via ORAL
  Filled 2012-07-12 (×22): qty 2

## 2012-07-12 MED ORDER — DIGOXIN 125 MCG PO TABS
0.1250 mg | ORAL_TABLET | Freq: Every day | ORAL | Status: DC
  Administered 2012-07-12 – 2012-07-13 (×2): 0.125 mg via ORAL
  Filled 2012-07-12 (×3): qty 1

## 2012-07-12 MED ORDER — WARFARIN SODIUM 4 MG PO TABS: 4.0000 mg | ORAL_TABLET | ORAL | Status: DC

## 2012-07-12 MED ORDER — LISINOPRIL 2.5 MG PO TABS
2.5000 mg | ORAL_TABLET | Freq: Every day | ORAL | Status: DC
  Administered 2012-07-12 – 2012-07-18 (×6): 2.5 mg via ORAL
  Filled 2012-07-12 (×8): qty 1

## 2012-07-12 MED ORDER — PANTOPRAZOLE SODIUM 40 MG PO TBEC
40.0000 mg | DELAYED_RELEASE_TABLET | Freq: Every day | ORAL | Status: DC
  Administered 2012-07-12 – 2012-07-22 (×11): 40 mg via ORAL
  Filled 2012-07-12 (×11): qty 1

## 2012-07-12 MED ORDER — HEPARIN SODIUM (PORCINE) 5000 UNIT/ML IJ SOLN
5000.0000 [IU] | Freq: Three times a day (TID) | INTRAMUSCULAR | Status: DC
  Administered 2012-07-12: 5000 [IU] via SUBCUTANEOUS

## 2012-07-12 MED ORDER — IBUPROFEN 400 MG PO TABS: 400.0000 mg | ORAL_TABLET | Freq: Every day | ORAL | Status: DC

## 2012-07-12 MED ORDER — SODIUM CHLORIDE 0.9 % IJ SOLN: 3.0000 mL | INTRAMUSCULAR | Status: DC | PRN

## 2012-07-12 MED ORDER — POTASSIUM CHLORIDE CRYS ER 20 MEQ PO TBCR
40.0000 meq | EXTENDED_RELEASE_TABLET | Freq: Two times a day (BID) | ORAL | Status: DC
  Administered 2012-07-12 – 2012-07-14 (×6): 40 meq via ORAL
  Filled 2012-07-12 (×7): qty 2

## 2012-07-12 MED ORDER — WARFARIN - PHYSICIAN DOSING INPATIENT
Freq: Every day | Status: DC
  Administered 2012-07-15 – 2012-07-21 (×3)

## 2012-07-12 MED ORDER — METOLAZONE 2.5 MG PO TABS
2.5000 mg | ORAL_TABLET | Freq: Every day | ORAL | Status: DC
  Administered 2012-07-12 – 2012-07-13 (×2): 2.5 mg via ORAL
  Filled 2012-07-12 (×2): qty 1

## 2012-07-12 MED ORDER — DIGOXIN 0.25 MG/ML IJ SOLN
0.2500 mg | Freq: Once | INTRAMUSCULAR | Status: AC
  Administered 2012-07-12: 0.25 mg via INTRAVENOUS
  Filled 2012-07-12: qty 1

## 2012-07-12 MED ORDER — SODIUM CHLORIDE 0.9 % IV SOLN: 250.0000 mL | INTRAVENOUS | Status: DC | PRN

## 2012-07-12 MED ORDER — ONDANSETRON HCL 4 MG/2ML IJ SOLN: 4.0000 mg | Freq: Four times a day (QID) | INTRAMUSCULAR | Status: DC | PRN

## 2012-07-12 MED ORDER — ROPINIROLE HCL 1 MG PO TABS
1.0000 mg | ORAL_TABLET | Freq: Every day | ORAL | Status: DC
  Administered 2012-07-12 – 2012-07-21 (×11): 1 mg via ORAL
  Filled 2012-07-12 (×12): qty 1

## 2012-07-12 MED ORDER — ACETAMINOPHEN 325 MG PO TABS: 650.0000 mg | ORAL_TABLET | ORAL | Status: DC | PRN

## 2012-07-12 NOTE — Progress Notes (Signed)
Subjective:  Feeling better but chronic shortness of breath. Afebrile.  Objective:  Vital Signs in the last 24 hours: Temp:  [98.1 F (36.7 C)-98.5 F (36.9 C)] 98.1 F (36.7 C) (04/19 0755) Pulse Rate:  [86-135] 92 (04/19 0700) Cardiac Rhythm:  [-]  Resp:  [20-37] 33 (04/19 0700) BP: (90-123)/(40-70) 123/70 mmHg (04/19 1006) SpO2:  [88 %-99 %] 95 % (04/19 0700) Weight:  [75 kg (165 lb 5.5 oz)-77 kg (169 lb 12.1 oz)] 77 kg (169 lb 12.1 oz) (04/19 0400)  Physical Exam: BP Readings from Last 1 Encounters:  07/12/12 123/70     Wt Readings from Last 1 Encounters:  07/12/12 77 kg (169 lb 12.1 oz)    Weight change:   HEENT: [place]/AT, Eyes-Blue, PERL, EOMI, Conjunctiva-Pink, Sclera-Non-icteric Neck: No JVD, No bruit, Trachea midline. Lungs:  Clear, Bilateral. Cardiac:  Regular rhythm, normal S1 and S2, no S3. II/VI systolic murmur. Abdomen:  Soft, non-tender. Extremities:  1 + edema present. No cyanosis. No clubbing. CNS: AxOx3, Cranial nerves grossly intact, moves all 4 extremities. Right handed. Skin: Warm and dry.   Intake/Output from previous day: 04/18 0701 - 04/19 0700 In: 40 [P.O.:40] Out: 100 [Urine:100]    Lab Results: BMET    Component Value Date/Time   NA 128* 07/11/2012 1901   K 3.5 07/11/2012 1901   CL 94* 07/11/2012 1901   CO2 20 07/11/2012 1901   GLUCOSE 87 07/11/2012 1901   BUN 20 07/11/2012 1901   CREATININE 1.06 07/11/2012 1901   CREATININE 0.73 03/05/2012 1652   CALCIUM 8.7 07/11/2012 1901   GFRNONAA 50* 07/11/2012 1901   GFRAA 58* 07/11/2012 1901   CBC    Component Value Date/Time   WBC 10.0 07/11/2012 1901   RBC 3.12* 07/11/2012 1901   HGB 9.6* 07/11/2012 1901   HCT 27.8* 07/11/2012 1901   PLT 177 07/11/2012 1901   MCV 89.1 07/11/2012 1901   MCH 30.8 07/11/2012 1901   MCHC 34.5 07/11/2012 1901   RDW 14.3 07/11/2012 1901   LYMPHSABS 1.5 07/11/2012 1901   MONOABS 0.9 07/11/2012 1901   EOSABS 0.0 07/11/2012 1901   BASOSABS 0.0 07/11/2012 1901   CARDIAC  ENZYMES Lab Results  Component Value Date   CKTOTAL 31 02/05/2012   CKMB 3.5 02/05/2012   TROPONINI 1.03* 02/04/2012    Scheduled Meds: . atorvastatin  10 mg Oral q1800  . digoxin  0.25 mg Intravenous Once  . digoxin  0.125 mg Oral Daily  . furosemide  40 mg Intravenous Once  . hydroxychloroquine  200 mg Oral BID  . ibuprofen  400 mg Oral QHS  . lisinopril  2.5 mg Oral Daily  . metolazone  2.5 mg Oral Daily  . metoprolol tartrate  12.5 mg Oral BID  . pantoprazole  40 mg Oral Daily  . potassium chloride SA  40 mEq Oral BID  . predniSONE  5 mg Oral Q breakfast  . rOPINIRole  1 mg Oral QHS  . sodium chloride  3 mL Intravenous Q12H  . sulfaSALAzine  1,000 mg Oral BID  . warfarin  4 mg Oral Custom  . 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   LOS: 1 day    Orpah Cobb  MD  07/12/2012, 10:59 AM

## 2012-07-12 NOTE — ED Provider Notes (Signed)
72 year old female comes in with worsening dyspnea today. She just completed a course of antibiotics for urinary tract infection although she does not able to take her dose today because she had nausea and vomiting. On exam, lungs are clear but she does have trace edema. Heart is irregularly irregular. ECG shows atrial fibrillation. Laboratory workup shows significant congestive heart failure as manifested by markedly elevated BNP. Case has been discussed with Dr. Algie Coffer who is on call for patient's cardiologist and he has come in to admit the patient. Of note, urinalysis is contaminated but still suggestive of UTI. She had been placed on Keflex for a urine infection which had grown out Klebsiella which was sensitive to cephalosporins. She was on an appropriate antibiotic. Urine cultures done to see if she has a different infecting organism.   Date: 07/12/2012  Rate: 109  Rhythm: atrial fibrillation  QRS Axis: normal  Intervals: QT prolonged  ST/T Wave abnormalities: ST depression in inferior and anterolateral leads  Conduction Disutrbances:none   Narrative Interpretation: Atrial fibrillation with ST depression and prolonged QTC. When compared with ECG of 06/29/2012, or rate is slightly faster and QT interval is unchanged but QTC has increased because of change in rate.  Old EKG Reviewed: changes noted   I saw and evaluated the patient, reviewed the resident's note and I agree with the findings and plan.  Dione Booze, MD 07/12/12 [phone]

## 2012-07-12 NOTE — ED Notes (Signed)
Report given to ICU nurse and pt transported to the ICu with nurse and monitor. Pt alert and in NAD at time of admission

## 2012-07-12 NOTE — Progress Notes (Signed)
  Echocardiogram 2D Echocardiogram has been performed.  Arloa Prak 07/12/2012, 10:46 AM

## 2012-07-12 NOTE — Care Management Note (Signed)
CARE MANAGEMENT NOTE 07/12/2012  Patient:  [name],[name]   Account Number:  [id number]  Date Initiated:  07/12/2012  Documentation initiated by:  Vance Peper  Subjective/Objective Assessment:   72 yr old female admitted with shortnes of breath, weakness.     Action/Plan:   Cm spoke with patient's daughter-Robin [phone]. Discussed need for [facility] for disease management. Choice offered.They have used[facility] in the past. CM will fax order. Patient has rolling walker and 3in1.   Anticipated DC Date:     Anticipated DC Plan:        DC Planning Services  CM consult      [facility] Choice  HOME HEALTH   Choice offered to / List presented to:  C-4 Adult Children        HH arranged  HH-10 DISEASE MANAGEMENT  HH-1 RN      [facility] agency  [facility]   Status of service:  In process, will continue to follow Medicare Important Message given?   (If response is "NO", the following Medicare IM given date fields will be blank) Date Medicare IM given:   Date Additional Medicare IM given:    Discharge Disposition:    Per UR Regulation:    If discussed at Long Length of Stay Meetings, dates discussed:    Comments:

## 2012-07-12 NOTE — Progress Notes (Signed)
In the process of setting up the alaris pump for the Dobutamine drip when the patient went to Afib with RVR with HR in the 170's to 190's. Patient was alert the entire event , was symptomatic as she could feel her heart racing. Blood pressure and RR remained stable. Md, [place] paged. Called returned. EKG completed. Afib with RVR confirmed. Md came to bedside to assess patient. Patient stable and resting at this time. HR now is 105 BPM. Will continue to assess patient.

## 2012-07-13 LAB — BASIC METABOLIC PANEL
Calcium: 8.6 mg/dL (ref 8.4–10.5)
GFR calc Af Amer: 62 mL/min — ABNORMAL LOW (ref >90)
GFR calc non Af Amer: 53 mL/min — ABNORMAL LOW (ref >90)
Glucose, Bld: 78 mg/dL (ref 70–99)
Potassium: 3.8 mEq/L (ref 3.5–5.1)
Sodium: 131 mEq/L — ABNORMAL LOW (ref 135–145)

## 2012-07-13 LAB — PROTIME-INR: INR: 2.87 — ABNORMAL HIGH (ref 0.00–1.49)

## 2012-07-13 MED ORDER — FUROSEMIDE 40 MG PO TABS
40.0000 mg | ORAL_TABLET | Freq: Every day | ORAL | Status: DC
  Administered 2012-07-13 – 2012-07-16 (×4): 40 mg via ORAL
  Filled 2012-07-13 (×5): qty 1

## 2012-07-13 MED ORDER — SILDENAFIL CITRATE 20 MG PO TABS
20.0000 mg | ORAL_TABLET | Freq: Three times a day (TID) | ORAL | Status: DC
  Administered 2012-07-13 – 2012-07-14 (×3): 20 mg via ORAL
  Filled 2012-07-13 (×10): qty 1

## 2012-07-13 MED ORDER — DILTIAZEM HCL 30 MG PO TABS
30.0000 mg | ORAL_TABLET | Freq: Two times a day (BID) | ORAL | Status: DC
  Administered 2012-07-13 – 2012-07-22 (×17): 30 mg via ORAL
  Filled 2012-07-13 (×21): qty 1

## 2012-07-13 NOTE — Evaluation (Signed)
Physical Therapy Evaluation Patient Details Name: [name] MRN: [id number] DOB: 16-Feb-1941 Today's Date: 07/13/2012 Time: 4098-1191 PT Time Calculation (min): 33 min  PT Assessment / Plan / Recommendation Clinical Impression  Pt admitted for shortness of breath and significant weakness; with h/o CAD.  Patient presents with generalized weakness and dependence in mobility.  Patient will benefit from PT to progress mobility and increase independence with mobility.  Feel patient will reach modifiend independent level for mobility.  Patient has great family support and appears motiviated.      PT Assessment  Patient needs continued PT services    Follow Up Recommendations  Home health PT    Does the patient have the potential to tolerate intense rehabilitation      Barriers to Discharge        Equipment Recommendations  Cane    Recommendations for Other Services     Frequency Min 3X/week    Precautions / Restrictions Precautions Precautions: Fall Restrictions Weight Bearing Restrictions: No   Pertinent Vitals/Pain No pain indicated      Mobility  Bed Mobility Bed Mobility: Supine to Sit Supine to Sit: 6: Modified independent (Device/Increase time);HOB elevated Transfers Transfers: Sit to Stand;Stand to Sit Sit to Stand: 4: Min assist;With upper extremity assist;From bed Stand to Sit: 4: Min assist;To chair/3-in-1;With upper extremity assist Ambulation/Gait Ambulation/Gait Assistance: 4: Min assist Ambulation Distance (Feet): 15 Feet Assistive device: 1 person hand held assist Ambulation/Gait Assistance Details: patient required physical assistance to maintain balance.   Gait Pattern: Step-through pattern    Exercises General Exercises - Lower Extremity Ankle Circles/Pumps: AROM;Both;10 reps;Seated Gluteal Sets: AROM;Both;10 reps;Seated Long Arc Quad: AROM;Both;10 reps;Seated Hip Flexion/Marching: AROM;Both;10 reps;Seated   PT Diagnosis: Generalized weakness   PT Problem List: Decreased activity tolerance;Decreased balance;Decreased mobility;Decreased knowledge of use of DME PT Treatment Interventions: Gait training;DME instruction;Functional mobility training;Therapeutic activities;Therapeutic exercise;Balance training   PT Goals Acute Rehab PT Goals PT Goal Formulation: With patient/family Time For Goal Achievement: 07/27/12 Potential to Achieve Goals: Good Pt will go Sit to Stand: with supervision PT Goal: Sit to Stand - Progress: Goal set today Pt will go Stand to Sit: with supervision PT Goal: Stand to Sit - Progress: Goal set today Pt will Stand: with supervision;3 - 5 min;with no upper extremity support PT Goal: Stand - Progress: Goal set today Pt will Ambulate: 51 - 150 feet;with cane;with supervision PT Goal: Ambulate - Progress: Goal set today Pt will Perform Home Exercise Program: with supervision, verbal cues required/provided PT Goal: Perform Home Exercise Program - Progress: Goal set today  Visit Information  Last PT Received On: 07/13/12 Assistance Needed: +1    Subjective Data  Patient Stated Goal: Family would like for her to come home with one of them   Prior Functioning  Home Living Lives With: Alone Available Help at Discharge: Family Type of Home: House Home Layout: One level Home Adaptive Equipment: None Additional Comments: plans to d/c to daughter's house with intention to eventually return home alone Prior Function Level of Independence: Independent Able to Take Stairs?: Yes Driving: Yes Communication Communication: No difficulties    Cognition  Cognition Arousal/Alertness: Awake/alert Behavior During Therapy: WFL for tasks assessed/performed Overall Cognitive Status: Within Functional Limits for tasks assessed    Extremity/Trunk Assessment Right Upper Extremity Assessment RUE ROM/Strength/Tone: [facility] for tasks assessed Left Upper Extremity Assessment LUE ROM/Strength/Tone: [facility] for tasks  assessed Right Lower Extremity Assessment RLE ROM/Strength/Tone: Within functional levels Left Lower Extremity Assessment LLE ROM/Strength/Tone: Within functional levels Trunk  Assessment Trunk Assessment: Normal   Balance Balance Balance Assessed: Yes Dynamic Standing Balance Dynamic Standing - Comments: patient withl loss of balance during ambulation; requiring assistance to maintain upright/correct  End of Session PT - End of Session Activity Tolerance: Patient tolerated treatment well Patient left: in chair;with call bell/phone within reach;with family/visitor present  GP     Olivia Canter, [place] [phone] 07/13/2012, 2:18 PM

## 2012-07-13 NOTE — Progress Notes (Signed)
Subjective:  Feeling better. T max 99.2. Echo with good LV systolic function but elevated pulmonary systolic pressure 60-75 mm Hg, dilated LA and RA and moderate MR, Severe TR..   Objective:  Vital Signs in the last 24 hours: Temp:  [97.8 F (36.6 C)-99.2 F (37.3 C)] 98.4 F (36.9 C) (04/20 0742) Pulse Rate:  [75-123] 86 (04/20 0800) Cardiac Rhythm:  [-] Atrial fibrillation (04/20 0800) Resp:  [20-38] 23 (04/20 0800) BP: (80-123)/(36-81) 109/59 mmHg (04/20 0800) SpO2:  [88 %-100 %] 98 % (04/20 0800) Weight:  [76.2 kg (167 lb 15.9 oz)] 76.2 kg (167 lb 15.9 oz) (04/20 0500)  Physical Exam: BP Readings from Last 1 Encounters:  07/13/12 109/59     Wt Readings from Last 1 Encounters:  07/13/12 76.2 kg (167 lb 15.9 oz)    Weight change: 1.2 kg (2 lb 10.3 oz)  HEENT: [place]/AT, Eyes-Blue, PERL, EOMI, Conjunctiva-Pink, Sclera-Non-icteric Neck: No JVD, No bruit, Trachea midline. Lungs:  Clear, Bilateral. Cardiac:  Regular rhythm, normal S1 and S2, no S3.  Abdomen:  Soft, non-tender. Extremities:  Trace edema present. No cyanosis. No clubbing. CNS: AxOx3, Cranial nerves grossly intact, moves all 4 extremities. Right handed. Skin: Warm and dry.   Intake/Output from previous day: 04/19 0701 - 04/20 0700 In: 836 [P.O.:810; I.V.:6] Out: 700 [Urine:700]    Lab Results: BMET    Component Value Date/Time   NA 131* 07/13/2012 0408   K 3.8 07/13/2012 0408   CL 98 07/13/2012 0408   CO2 22 07/13/2012 0408   GLUCOSE 78 07/13/2012 0408   BUN 19 07/13/2012 0408   CREATININE 1.01 07/13/2012 0408   CREATININE 0.73 03/05/2012 1652   CALCIUM 8.6 07/13/2012 0408   GFRNONAA 53* 07/13/2012 0408   GFRAA 62* 07/13/2012 0408   CBC    Component Value Date/Time   WBC 10.0 07/11/2012 1901   RBC 3.12* 07/11/2012 1901   HGB 9.6* 07/11/2012 1901   HCT 27.8* 07/11/2012 1901   PLT 177 07/11/2012 1901   MCV 89.1 07/11/2012 1901   MCH 30.8 07/11/2012 1901   MCHC 34.5 07/11/2012 1901   RDW 14.3 07/11/2012 1901   LYMPHSABS 1.5 07/11/2012 1901   MONOABS 0.9 07/11/2012 1901   EOSABS 0.0 07/11/2012 1901   BASOSABS 0.0 07/11/2012 1901   CARDIAC ENZYMES Lab Results  Component Value Date   CKTOTAL 31 02/05/2012   CKMB 3.5 02/05/2012   TROPONINI 1.03* 02/04/2012    Scheduled Meds: . atorvastatin  10 mg Oral q1800  . digoxin  0.125 mg Oral Daily  . hydroxychloroquine  200 mg Oral BID  . ibuprofen  400 mg Oral QHS  . lisinopril  2.5 mg Oral Daily  . metolazone  2.5 mg Oral Daily  . metoprolol tartrate  12.5 mg Oral BID  . pantoprazole  40 mg Oral Daily  . potassium chloride SA  40 mEq Oral BID  . predniSONE  5 mg Oral Q breakfast  . rOPINIRole  1 mg Oral QHS  . sodium chloride  3 mL Intravenous Q12H  . sulfaSALAzine  1,000 mg Oral BID  . warfarin  4 mg Oral Custom  . 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 Try Revatio 20 mg. 3 times daily as tolerated. Patient advised to use oxygen at least 12 hours a day (which she already has at home). Increase activity with PT evaluation.   LOS: 2 days    Orpah Cobb  MD  07/13/2012, 9:51 AM

## 2012-07-14 LAB — BASIC METABOLIC PANEL
BUN: 24 mg/dL — ABNORMAL HIGH (ref 6–23)
CO2: 22 mEq/L (ref 19–32)
Calcium: 8.5 mg/dL (ref 8.4–10.5)
Chloride: 98 mEq/L (ref 96–112)
Creatinine, Ser: 1.23 mg/dL — ABNORMAL HIGH (ref 0.50–1.10)
GFR calc Af Amer: 49 mL/min — ABNORMAL LOW (ref >90)
GFR calc non Af Amer: 42 mL/min — ABNORMAL LOW (ref >90)
Glucose, Bld: 92 mg/dL (ref 70–99)
Potassium: 4 mEq/L (ref 3.5–5.1)
Sodium: 130 mEq/L — ABNORMAL LOW (ref 135–145)

## 2012-07-14 LAB — PROTIME-INR: Prothrombin Time: 24.8 seconds — ABNORMAL HIGH (ref 11.6–15.2)

## 2012-07-14 LAB — PRO B NATRIURETIC PEPTIDE: Pro B Natriuretic peptide (BNP): 5791 pg/mL — ABNORMAL HIGH (ref 0–125)

## 2012-07-14 MED ORDER — POTASSIUM CHLORIDE CRYS ER 20 MEQ PO TBCR
20.0000 meq | EXTENDED_RELEASE_TABLET | Freq: Two times a day (BID) | ORAL | Status: DC
  Administered 2012-07-14 – 2012-07-16 (×4): 20 meq via ORAL
  Filled 2012-07-14 (×4): qty 1

## 2012-07-14 MED ORDER — DIGOXIN 250 MCG PO TABS
0.2500 mg | ORAL_TABLET | Freq: Every day | ORAL | Status: DC
  Administered 2012-07-15: 0.25 mg via ORAL
  Filled 2012-07-14 (×2): qty 1

## 2012-07-14 NOTE — Progress Notes (Signed)
Physical Therapy Treatment Patient Details Name: [name] MRN: [id number] DOB: 13-Nov-1940 Today's Date: 07/14/2012 Time: 8295-6213 PT Time Calculation (min): 30 min  PT Assessment / Plan / Recommendation Comments on Treatment Session  Pt progressing well however remains unsteady during ambulation with variable HR >100 but <130 bpm t/o session. Pt also with noted DOE. Pt reports she has that sometimes. Patient assisted to bathroom. Pt performed hygiene with supervision. Pt to con't to benefit from PT to maximize functional recovery as patient was I PTA.    Follow Up Recommendations  Home health PT;Supervision/Assistance - 24 hour     Does the patient have the potential to tolerate intense rehabilitation     Barriers to Discharge        Equipment Recommendations  Cane (may need RW)    Recommendations for Other Services    Frequency Min 3X/week   Plan Discharge plan remains appropriate;Frequency remains appropriate    Precautions / Restrictions Precautions Precautions: Fall Restrictions Weight Bearing Restrictions: No   Pertinent Vitals/Pain Pt denies pain    Mobility  Bed Mobility Bed Mobility: Supine to Sit Supine to Sit: 6: Modified independent (Device/Increase time);HOB elevated Details for Bed Mobility Assistance: increased time due to fatigue and sleepiness Transfers Transfers: Sit to Stand;Stand to Sit Sit to Stand: 4: Min assist;With upper extremity assist;From bed Stand to Sit: 4: Min assist;To chair/3-in-1;With upper extremity assist Details for Transfer Assistance: pt with strong use of grab bars in bathroom due to low surface height, increased time, mild DOE Ambulation/Gait Ambulation/Gait Assistance: 4: Min assist Ambulation Distance (Feet): 60 Feet Assistive device: 1 person hand held assist (RN to carry O2 tank and monitor) Ambulation/Gait Assistance Details: pt with + DOE, pt reports this to be normal, SpO2>95% on 2Lo2. HR varied from 108-128. RN  precent. once seated s/p 1 min pt HR around 93 Gait Pattern: Step-through pattern;Decreased stride length;Narrow base of support Gait velocity: slow General Gait Details: pt unsteady requiring minA to maintain balance. without HHA pt reaching for items/railings to hold onto Stairs: No    Exercises     PT Diagnosis:    PT Problem List:   PT Treatment Interventions:     PT Goals Acute Rehab PT Goals PT Goal: Sit to Stand - Progress: Progressing toward goal PT Goal: Stand to Sit - Progress: Progressing toward goal PT Goal: Ambulate - Progress: Progressing toward goal  Visit Information  Last PT Received On: 07/14/12 Assistance Needed: +1    Subjective Data  Subjective: Pt received supine in bed with c/o "I didn't sleep at all last night." Pt on 2 LO2 via [place]   Cognition  Cognition Arousal/Alertness: Awake/alert Behavior During Therapy: WFL for tasks assessed/performed Overall Cognitive Status: Within Functional Limits for tasks assessed    Balance  Dynamic Sitting Balance Dynamic Sitting - Balance Support: No upper extremity supported;During functional activity Dynamic Sitting - Level of Assistance: 5: Stand by assistance Dynamic Sitting - Balance Activities:  (pt donned bilat socks) Dynamic Sitting - Comments: pt with mild DOE with activity  End of Session PT - End of Session Equipment Utilized During Treatment: Oxygen Activity Tolerance: Patient limited by fatigue Patient left: in chair;with call bell/phone within reach;with nursing in room Nurse Communication:  (RN present for tx)   GP     Marcene Brawn 07/14/2012, 8:41 AM  Lewis Shock, PT, DPT Pager #: [phone] Office #: [phone]

## 2012-07-14 NOTE — Progress Notes (Signed)
Subjective:  Patient denies any chest pain states feels better. Beta blockers and patient elsewhere held because of low blood pressure today. Remains in A. fib with moderate to rapid ventricular response with minimal exertion.  Objective:  Vital Signs in the last 24 hours: Temp:  [97.6 F (36.4 C)-98.6 F (37 C)] 98.4 F (36.9 C) (04/21 0758) Pulse Rate:  [73-96] 79 (04/21 0758) Resp:  [15-26] 21 (04/21 0758) BP: (83-112)/(39-68) 95/39 mmHg (04/21 1000) SpO2:  [93 %-100 %] 100 % (04/21 0758) Weight:  [80.5 kg (177 lb 7.5 oz)] 80.5 kg (177 lb 7.5 oz) (04/21 0441)  Intake/Output from previous day: 04/20 0701 - 04/21 0700 In: 248 [P.O.:245; I.V.:3] Out: -  Intake/Output from this shift: Total I/O In: 240 [P.O.:240] Out: -   Physical Exam: Neck: no adenopathy, no carotid bruit, no JVD and supple, symmetrical, trachea midline Lungs: Decreased breath sound at bases with occasional rales Heart: irregularly irregular rhythm, S1, S2 normal and 2/6 systolic murmur and soft S3 gallop noted Abdomen: soft, non-tender; bowel sounds normal; no masses,  no organomegaly Extremities: extremities normal, atraumatic, no cyanosis or edema  Lab Results:  Recent Labs  07/11/12 1901  WBC 10.0  HGB 9.6*  PLT 177    Recent Labs  07/13/12 0408 07/14/12 0530  NA 131* 130*  K 3.8 4.0  CL 98 98  CO2 22 22  GLUCOSE 78 92  BUN 19 24*  CREATININE 1.01 1.23*   No results found for this basename: TROPONINI, CK, MB,  in the last 72 hours Hepatic Function Panel  Recent Labs  07/11/12 1901  PROT 7.1  ALBUMIN 2.4*  AST 25  ALT 9  ALKPHOS 96  BILITOT 0.9   No results found for this basename: CHOL,  in the last 72 hours No results found for this basename: PROTIME,  in the last 72 hours  Imaging: Imaging results have been reviewed and No results found.  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

## 2012-07-14 NOTE — Progress Notes (Signed)
MD [name] notified of Pt BP- 95/39. Metoprolol, Lisinopril, Cardizem, Digoxin held per MD request. MD notified of Pt HR with ambulation in hall as high as 140-afib. Quickly returned to 80-90 after sitting back in chair. Will cont to monitor pt. Zyen Triggs L

## 2012-07-14 NOTE — Progress Notes (Signed)
[facility]  Patient Status: New  [facility] is providing the following services: RN.  This referral was called/faxed in this weekend but the patient did not go home as expected.  We will continue to follow progression until she is discharged.  If patient discharges after hours, please call [phone].   Wynelle Bourgeois 07/14/2012, 11:58 AM

## 2012-07-15 ENCOUNTER — Encounter (HOSPITAL_COMMUNITY)
Admission: EM | Disposition: A | Discharge status: Home / Self Care | Payer: Self-pay | Source: Home / Self Care | Attending: Cardiovascular Disease

## 2012-07-15 ENCOUNTER — Encounter (HOSPITAL_COMMUNITY): Payer: Self-pay | Admitting: *Deleted

## 2012-07-15 DIAGNOSIS — I330 Acute and subacute infective endocarditis: Secondary | ICD-10-CM

## 2012-07-15 DIAGNOSIS — B952 Enterococcus as the cause of diseases classified elsewhere: Secondary | ICD-10-CM

## 2012-07-15 HISTORY — PX: TEE WITHOUT CARDIOVERSION: SHX5443

## 2012-07-15 LAB — CBC
HCT: 27 % — ABNORMAL LOW (ref 36.0–46.0)
MCH: 30.9 pg (ref 26.0–34.0)
MCV: 89.7 fL (ref 78.0–100.0)
RBC: 3.01 MIL/uL — ABNORMAL LOW (ref 3.87–5.11)
WBC: 7.5 10*3/uL (ref 4.0–10.5)

## 2012-07-15 LAB — BASIC METABOLIC PANEL
BUN: 22 mg/dL (ref 6–23)
Chloride: 97 mEq/L (ref 96–112)
Creatinine, Ser: 1.11 mg/dL — ABNORMAL HIGH (ref 0.50–1.10)
Glucose, Bld: 98 mg/dL (ref 70–99)
Potassium: 3.8 mEq/L (ref 3.5–5.1)

## 2012-07-15 LAB — URINE CULTURE: Colony Count: 50000

## 2012-07-15 SURGERY — ECHOCARDIOGRAM, TRANSESOPHAGEAL
Anesthesia: Moderate Sedation

## 2012-07-15 MED ORDER — GENTAMICIN IN SALINE 1.6-0.9 MG/ML-% IV SOLN
80.0000 mg | Freq: Two times a day (BID) | INTRAVENOUS | Status: AC
  Administered 2012-07-15: 80 mg via INTRAVENOUS
  Filled 2012-07-15 (×2): qty 50

## 2012-07-15 MED ORDER — MIDAZOLAM HCL 10 MG/2ML IJ SOLN
INTRAMUSCULAR | Status: DC | PRN
  Administered 2012-07-15: 1 mg via INTRAVENOUS
  Administered 2012-07-15 (×2): 2 mg via INTRAVENOUS

## 2012-07-15 MED ORDER — GENTAMICIN IN SALINE 1.2-0.9 MG/ML-% IV SOLN
60.0000 mg | Freq: Two times a day (BID) | INTRAVENOUS | Status: DC
  Administered 2012-07-15 – 2012-07-19 (×7): 60 mg via INTRAVENOUS
  Filled 2012-07-15 (×10): qty 50

## 2012-07-15 MED ORDER — FENTANYL CITRATE 0.05 MG/ML IJ SOLN
INTRAMUSCULAR | Status: AC
  Filled 2012-07-15: qty 4

## 2012-07-15 MED ORDER — SODIUM CHLORIDE 0.9 % IV SOLN
INTRAVENOUS | Status: DC
  Administered 2012-07-15 – 2012-07-16 (×2): via INTRAVENOUS
  Administered 2012-07-17 – 2012-07-19 (×2): 1000 mL via INTRAVENOUS
  Administered 2012-07-20: 05:00:00 via INTRAVENOUS

## 2012-07-15 MED ORDER — FENTANYL CITRATE 0.05 MG/ML IJ SOLN
INTRAMUSCULAR | Status: DC | PRN
  Administered 2012-07-15: 25 ug via INTRAVENOUS

## 2012-07-15 MED ORDER — MIDAZOLAM HCL 5 MG/ML IJ SOLN
INTRAMUSCULAR | Status: AC
  Filled 2012-07-15: qty 2

## 2012-07-15 MED ORDER — SODIUM CHLORIDE 0.9 % IV SOLN
3.0000 g | Freq: Four times a day (QID) | INTRAVENOUS | Status: DC
  Administered 2012-07-15: 3 g via INTRAVENOUS
  Filled 2012-07-15 (×3): qty 3

## 2012-07-15 MED ORDER — BUTAMBEN-TETRACAINE-BENZOCAINE 2-2-14 % EX AERO
INHALATION_SPRAY | CUTANEOUS | Status: DC | PRN
  Administered 2012-07-15: 2 via TOPICAL

## 2012-07-15 MED ORDER — SODIUM CHLORIDE 0.9 % IV SOLN
2.0000 g | INTRAVENOUS | Status: DC
  Administered 2012-07-15 – 2012-07-18 (×20): 2 g via INTRAVENOUS
  Filled 2012-07-15 (×25): qty 2000

## 2012-07-15 NOTE — Consult Note (Signed)
[facility]    Date of Admission:  07/11/2012  Date of Consult:  07/15/2012  Reason for Consult: Recurrent enterococcal bacteremia with concern for recurrent endocarditis Referring Physician: Dr. Algie Coffer   HPI: [name] is an 72 y.o. 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 Drue Second at that time in the hospital. She was also seen by Dr. Dorris Fetch from [facility] who felt strongly that the patient was NOT a good operative candidate should there be an indication for valve replacement for endocarditis and that pt had a prohibitively high intraoperative mortality. She was therefore managed medically with ampicillin and gentamicin then changed over to ampicillin and ceftriaxone high dose in an attempt to avoid nephrotoxicity associated with gentamicin. He completed 6 weeks of antibiotics on December 24 and was then stopped. Along the way the patient did develop a rash that developed elements of exfoliation. Patient was seen in Dr. Roselee Nova his office as well as by my partner Dr. Ninetta Lights. There was some concern whether this might be an allergic drug reaction to one of her antibiotics but as mentioned she had continued on these antibiotics for the duration of 6 weeks and completed therapy despite presence of his rash. At the time there is some speculation as whether the rash might be also related to volume overload and/or possibly to her lupus. Per the family the daughters and the patient did the rash did resolve when she was taken off the ceftriaxone and ampicillin.  Early this April the patient felt profound fatigue and malaise and was evaluated in emergent [facility] at [facility]. Analysis was performed with urine culture and blood cultures.   Urine cx grew >100K klebsiella PNA and pt had been in the meantime  rx with keflex for presumed UTI.  Her BLOOD CULTURES however from that same date grew 2/2 AMPicillin sensitive ENTEROCOCCUS  [facility] notified ED personnel and they were to have notified ED MD but there is NO documentation of further interventions and patient was not contacted, or brought in for evaluation of her bloodstream infection.  In the interim the patient became profoundly weak with cold chills but no measurable fevers worsening shortness of breath and malaise. She states that she could barely stand up. She is again seen in emergent department on the 18th and admitted to Dr. Roseanne Kaufman   Service  Urine was again sampled but not blood. Urine cx grew AMP R enterococcus now at 50k CFU   Today we were consulted regarding her recurrent Enterococcal bacteremia and concern for recurrent endocarditis.   The patient has repeat blood cultures drawn from today and today she has been started on ampicillin and gentamicin.  TEE shows vegetation on the Mitral valve and also on the Aortic valve.   I spent greater than 60 minutes with the patient including greater than 50% of time in face to face counsel of the patient, patients 3 daughters and son and in coordination of their care.  I did also contact risk management given nature of the case and request of pts daughter    Past Medical History  Diagnosis Date  . Coronary artery disease   . Atrial fibrillation     Now NSR  . GERD (gastroesophageal reflux disease)   . Hyperlipidemia   . Pulmonary fibrosis     due to connective  tissue disorder   . Anemia   . Angina   . Shortness of breath     with activity  . Hypertension   . Blood transfusion   . Pneumonia   . H/O hiatal hernia   . Lupus     Past Surgical History  Procedure Laterality Date  . Cardiac catheterization    . Cardiac valve replacement      2006  . Coronary artery bypass graft    . Givens capsule study  04/09/2011    Procedure: GIVENS CAPSULE STUDY;  Surgeon: Theda Belfast, MD;  Location: [facility] ENDOSCOPY;  Service: Endoscopy;  Laterality: N/A;  . Tee without cardioversion  02/06/2012    Procedure: TRANSESOPHAGEAL ECHOCARDIOGRAM (TEE);  Surgeon: Ricki Rodriguez, MD;  Location: [facility] ENDOSCOPY;  Service: Cardiovascular;  Laterality: N/A;  . Esophagogastroduodenoscopy  02/12/2012    Procedure: ESOPHAGOGASTRODUODENOSCOPY (EGD);  Surgeon: Theda Belfast, MD;  Location: [facility] ENDOSCOPY;  Service: Endoscopy;  Laterality: N/A;  ergies:   Allergies  Allergen Reactions  . Codeine Nausea Only     Medications: I have reviewed patients current medications as documented in Epic Anti-infectives   Start     Dose/Rate Route Frequency Ordered Stop   07/15/12 2200  gentamicin (GARAMYCIN) IVPB 60 mg     60 mg 100 mL/hr over 30 Minutes Intravenous Every 12 hours 07/15/12 1159     07/15/12 1600  ampicillin (OMNIPEN) 2 g in sodium chloride 0.9 % 50 mL IVPB     2 g 150 mL/hr over 20 Minutes Intravenous 6 times per day 07/15/12 1340     07/15/12 1200  gentamicin (GARAMYCIN) IVPB 80 mg     80 mg 100 mL/hr over 30 Minutes Intravenous Every 12 hours 07/15/12 1036 07/15/12 1445   07/15/12 1100  Ampicillin-Sulbactam (UNASYN) 3 g in sodium chloride 0.9 % 100 mL IVPB  Status:  Discontinued     3 g 100 mL/hr over 60 Minutes Intravenous Every 6 hours 07/15/12 1036 07/15/12 1338   07/12/12 1000  hydroxychloroquine (PLAQUENIL) tablet 200 mg     200 mg Oral 2 times daily 07/12/12 0040        Social History:  reports that she has never smoked. She has never used smokeless tobacco. She reports that she does not drink alcohol. Her drug history is not on file.  Family History  Problem Relation Age of Onset  . Heart disease Mother     CHF  . Mental illness Father     suicide  . Cancer Sister     pancreatic cancer  . COPD Brother     As in HPI and primary teams notes otherwise 12 point review of systems is negative  Blood pressure 108/52, pulse 73, temperature 98.1 F (36.7 C),  temperature source Oral, resp. rate 20, height 5\' 6"  (1.676 m), weight 177 lb 7.5 oz (80.5 kg), SpO2 99.00%. General: Alert and awake, oriented x3, not in any acute distress. HEENT: anicteric sclera,, EOMI, oropharynx clear and without exudate CVS irr, regular rate, normal r,  II/VI SEM Chest: clear to auscultation bilaterally, no wheezing, rales or rhonchi Abdomen: soft nontender, nondistended, normal bowel sounds, Extremities: no  clubbing or edema noted bilaterally Skin: no acute rashes, residual healed rash on leg from this fall Neuro: nonfocal, strength and sensation intact   Results for orders placed during the hospital encounter of 07/11/12 (from the past 48 hour(s))  BASIC METABOLIC PANEL     Status: Abnormal   Collection Time  07/14/12  5:30 AM      Result Value Range   Sodium 130 (*) 135 - 145 mEq/L   Potassium 4.0  3.5 - 5.1 mEq/L   Chloride 98  96 - 112 mEq/L   CO2 22  19 - 32 mEq/L   Glucose, Bld 92  70 - 99 mg/dL   BUN 24 (*) 6 - 23 mg/dL   Creatinine, Ser 9.60 (*) 0.50 - 1.10 mg/dL   Calcium 8.5  8.4 - 45.4 mg/dL   GFR calc non Af Amer 42 (*) >90 mL/min   GFR calc Af Amer 49 (*) >90 mL/min   Comment:            The eGFR has been calculated     using the CKD EPI equation.     This calculation has not been     validated in all clinical     situations.     eGFR's persistently     <90 mL/min signify     possible Chronic Kidney Disease.  PROTIME-INR     Status: Abnormal   Collection Time    07/14/12  5:30 AM      Result Value Range   Prothrombin Time 24.8 (*) 11.6 - 15.2 seconds   INR 2.37 (*) 0.00 - 1.49  PRO B NATRIURETIC PEPTIDE     Status: Abnormal   Collection Time    07/14/12  4:51 PM      Result Value Range   Pro B Natriuretic peptide (BNP) 5791.0 (*) 0 - 125 pg/mL  BASIC METABOLIC PANEL     Status: Abnormal   Collection Time    07/15/12  4:20 AM      Result Value Range   Sodium 131 (*) 135 - 145 mEq/L   Potassium 3.8  3.5 - 5.1 mEq/L   Chloride  97  96 - 112 mEq/L   CO2 27  19 - 32 mEq/L   Glucose, Bld 98  70 - 99 mg/dL   BUN 22  6 - 23 mg/dL   Creatinine, Ser 0.98 (*) 0.50 - 1.10 mg/dL   Calcium 8.9  8.4 - 11.9 mg/dL   GFR calc non Af Amer 48 (*) >90 mL/min   GFR calc Af Amer 55 (*) >90 mL/min   Comment:            The eGFR has been calculated     using the CKD EPI equation.     This calculation has not been     validated in all clinical     situations.     eGFR's persistently     <90 mL/min signify     possible Chronic Kidney Disease.  PROTIME-INR     Status: Abnormal   Collection Time    07/15/12  4:20 AM      Result Value Range   Prothrombin Time 22.9 (*) 11.6 - 15.2 seconds   INR 2.13 (*) 0.00 - 1.49  CBC     Status: Abnormal   Collection Time    07/15/12  4:20 AM      Result Value Range   WBC 7.5  4.0 - 10.5 K/uL   RBC 3.01 (*) 3.87 - 5.11 MIL/uL   Hemoglobin 9.3 (*) 12.0 - 15.0 g/dL   HCT 14.7 (*) 82.9 - 56.2 %   MCV 89.7  78.0 - 100.0 fL   MCH 30.9  26.0 - 34.0 pg   MCHC 34.4  30.0 - 36.0 g/dL   RDW  14.2  11.5 - 15.5 %   Platelets 136 (*) 150 - 400 K/uL      Component Value Date/Time   SDES URINE, CLEAN CATCH 07/12/2012 0345   SPECREQUEST NONE 07/12/2012 0345   CULT ENTEROCOCCUS SPECIES 07/12/2012 0345   REPTSTATUS 07/15/2012 FINAL 07/12/2012 0345   No results found.   Recent Results (from the past 720 hour(s))  CULTURE, BLOOD (ROUTINE X 2)     Status: None   Collection Time    06/29/12  5:00 PM      Result Value Range Status   Specimen Description BLOOD RIGHT ARM   Final   Special Requests BOTTLES DRAWN AEROBIC AND ANAEROBIC   Final   Culture  Setup Time 06/29/2012 20:35   Final   Culture     Final   Value: ENTEROCOCCUS SPECIES     Note: COMBINATION THERAPY OF HIGH DOSE AMPICILLIN OR VANCOMYCIN, PLUS AN AMINOGLYCOSIDE, IS USUALLY INDICATED FOR SERIOUS ENTEROCOCCAL INFECTIONS.     Note: Gram Stain Report Called to,Read Back By and Verified With: TONI FESTERMAN 06/30/12 1430 BY [email or web address]    Report Status 07/03/2012 FINAL   Final   Organism ID, Bacteria ENTEROCOCCUS SPECIES   Final  CULTURE, BLOOD (ROUTINE X 2)     Status: None   Collection Time    06/29/12  5:30 PM      Result Value Range Status   Specimen Description BLOOD RIGHT HAND   Final   Special Requests BOTTLES DRAWN AEROBIC AND ANAEROBIC   Final   Culture  Setup Time 06/29/2012 20:35   Final   Culture     Final   Value: ENTEROCOCCUS SPECIES     Note: SUSCEPTIBILITIES PERFORMED ON PREVIOUS CULTURE WITHIN THE LAST 5 DAYS.     Note: Gram Stain Report Called to,Read Back By and Verified With: TONI FESTERMAN 06/30/12 1540 BY [email or web address]   Report Status 07/03/2012 FINAL   Final  URINE CULTURE     Status: None   Collection Time    06/29/12  6:21 PM      Result Value Range Status   Specimen Description URINE, CLEAN CATCH   Final   Special Requests NONE   Final   Culture  Setup Time 06/30/2012 01:46   Final   Colony Count >=100,000 COLONIES/ML   Final   Culture KLEBSIELLA PNEUMONIAE   Final   Report Status 07/01/2012 FINAL   Final   Organism ID, Bacteria KLEBSIELLA PNEUMONIAE   Final  MRSA PCR SCREENING     Status: None   Collection Time    07/12/12 12:54 AM      Result Value Range Status   MRSA by PCR NEGATIVE  NEGATIVE Final   Comment:            The GeneXpert MRSA Assay (FDA     approved for NASAL specimens     only), is one component of a     comprehensive MRSA colonization     surveillance program. It is not     intended to diagnose MRSA     infection nor to guide or     monitor treatment for     MRSA infections.  URINE CULTURE     Status: None   Collection Time    07/12/12  3:45 AM      Result Value Range Status   Specimen Description URINE, CLEAN CATCH   Final   Special Requests NONE   Final   Culture  Setup Time  07/12/2012 11:00   Final   Colony Count 50,000 COLONIES/ML   Final   Culture ENTEROCOCCUS SPECIES   Final   Report Status 07/15/2012 FINAL   Final   Organism ID, Bacteria ENTEROCOCCUS  SPECIES   Final     Impression/Recommendation  72 year old with ampicillin sensitive native valve endocarditis (but with nearby prosthetic aortic valve not involved before) treated this Nov-->December with AMpicillin, gentamicin x3 d--> Amp and high dose rocephin to complete 6 weeks. 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 Dorris Fetch the pt is NOt an operative candidate  For now we will continue AMP and GENT and monitor for rash, and for renal toxicity  If she can tolerate this regimen will push for repeat course of 6 weeks  If she cannot due to beta lactam allergy options will be vancomycin and gentamicin which will carry a high risk of nephrotoxicity or alternatively high-dose daptomycin all for 6 weeks.  # 2 Amp R enterococus in urine 50K (this like the >100K klebsiella PNA on urine cx from before)= colonizers and we will NOT target them   #3 ? Beta lactam rash? On amp, ceftriaxone before, monitor closely   Thank you so much for this interesting consult  [facility] [phone] (pager) [phone] (office) 07/15/2012, 5:00 PM  [name] 07/15/2012, 5:00 PM

## 2012-07-15 NOTE — Progress Notes (Signed)
Echocardiogram Echocardiogram Transesophageal has been performed.  [name] 07/15/2012, 2:52 PM

## 2012-07-15 NOTE — CV Procedure (Signed)
INDICATIONS:   The patient is 72 years old female with enterococcal infection and h/o vegetations.Marland Kitchen  PROCEDURE:  Informed consent was discussed including risks, benefits and alternatives for the procedure.  Risks include, but are not limited to, cough, sore throat, vomiting, nausea, somnolence, esophageal and stomach trauma or perforation, bleeding, low blood pressure, aspiration, pneumonia, infection, trauma to the teeth and death.    Patient was given sedation.  The oropharynx was anesthetized with topical lidocaine.  The transesophageal probe was inserted in the esophagus and stomach and multiple views were obtained.  Agitated saline was used after the transesophageal probe was removed from the body.  The patient was kept under observation until the patient left the procedure room.  The patient left the procedure room in stable condition.   COMPLICATIONS:  There were no immediate complications.  FINDINGS:  1. LEFT VENTRICLE: The left ventricle has moderate to severe hypertrophy and has preserved function.  Wall motion is normal.  No thrombus or masses seen in the left ventricle.  2. RIGHT VENTRICLE:  The right ventricle is normal in structure and function without any thrombus or masses.    3. LEFT ATRIUM:  The left atrium is dilated without any thrombus or masses.  4. LEFT ATRIAL APPENDAGE:  The left atrial appendage is free of any thrombus or masses.  5. RIGHT ATRIUM:  The right atrium is free of any thrombus or masses.    6. ATRIAL SEPTUM:  The atrial septum is normal without any ASD or PFO on provocation with sonicated saline injection..   7. MITRAL VALVE:  The mitral valve is thickened, calcified and with mobile echodensity suggestive of vegetation over anterior leaflet. It showed moderate to severe regurgitation.  8. TRICUSPID VALVE:  The tricuspid valve is normal in structure and function with mild to moderate regurgitation. No masses, stenosis or vegetations.  9. AORTIC VALVE:   The aortic valve is normal in structure and function without regurgitation, masses, stenosis.Small mobile echodensity at tip of right coronary cusp of unsure etiology   10. PULMONIC VALVE:  The pulmonic valve is normal in structure and function without regurgitation, masses, stenosis or vegetations.  11. AORTIC ARCH, ASCENDING AND DESCENDING AORTA:  The aorta had moderate diffuse atherosclerosis in the descending aorta.  The aortic arch was normal.  IMPRESSION:   Moderate to severe LVH. Moderate to severe MR, Mild to moderate TR. Possible vegetation on MV with thickening and calcification of leaflets. Can not rule out vegetation on AV. No PFO. Diffuse atherosclerosis of descending aorta.  RECOMMENDATIONS:    Prolong antibiotic treatment.  Dr. Sharyn Lull notified.

## 2012-07-15 NOTE — Progress Notes (Signed)
Physical Therapy Treatment Patient Details Name: [name] MRN: [id number] DOB: 11-21-1940 Today's Date: 07/15/2012 Time: 4098-1191 PT Time Calculation (min): 24 min  PT Assessment / Plan / Recommendation Comments on Treatment Session  Pt toelrating increased amb distance however did experience increased SOB, more than normal per patient report. RN aware. Pt assist to bathroom and was supervision for hygiene and transfer on/off commode. Encouraged patient to sit up in chair for all meals as she declined sitting up in chair s/p ambulation due to "I'm a wimp, I'd rather go back to bed." Pt provided with benefits and improtance of OOB moblity. RN notified as well.    Follow Up Recommendations  Home health PT;Supervision/Assistance - 24 hour     Does the patient have the potential to tolerate intense rehabilitation     Barriers to Discharge        Equipment Recommendations  Rolling walker with 5" wheels    Recommendations for Other Services    Frequency Min 3X/week   Plan Discharge plan remains appropriate;Frequency remains appropriate    Precautions / Restrictions Precautions Precautions: Fall Restrictions Weight Bearing Restrictions: No   Pertinent Vitals/Pain Pt denies pain    Mobility  Bed Mobility Bed Mobility: Supine to Sit Supine to Sit: 6: Modified independent (Device/Increase time);HOB elevated;With rails Transfers Transfers: Sit to Stand;Stand to Sit Sit to Stand: 4: Min guard;Without upper extremity assist;From bed;From toilet Stand to Sit: 4: Min assist;To chair/3-in-1;With upper extremity assist Details for Transfer Assistance: increased time,use of grab bars, definate use of hands for safe transfer Ambulation/Gait Ambulation/Gait Assistance: 4: Min assist Ambulation Distance (Feet): 120 Feet Assistive device: Rolling walker Ambulation/Gait Assistance Details: pt with + SOB - pt reports SOB to be more than normal. Pt on 2Lo2 via [place], SpO2 dropped into the 60  percent however pulse ox on  toe therefore most likely inaccurate reading. RN aware. HR between 115-137. RN also aware. Pt with increased stability with RW compared to no device Gait Pattern: Step-through pattern Gait velocity: slow Stairs: No    Exercises     PT Diagnosis:    PT Problem List:   PT Treatment Interventions:     PT Goals Acute Rehab PT Goals PT Goal: Sit to Stand - Progress: Progressing toward goal PT Goal: Stand to Sit - Progress: Progressing toward goal PT Goal: Ambulate - Progress: Progressing toward goal  Visit Information  Last PT Received On: 07/15/12 Assistance Needed: +1    Subjective Data  Subjective: Pt received supine in bed agreeable to PT.   Cognition  Cognition Arousal/Alertness: Awake/alert Behavior During Therapy: WFL for tasks assessed/performed Overall Cognitive Status: Within Functional Limits for tasks assessed    Balance     End of Session PT - End of Session Equipment Utilized During Treatment: Gait belt;Oxygen Activity Tolerance: Patient tolerated treatment well Patient left: in bed;with call bell/phone within reach (encourage pt to sit up in chair for all meals) Nurse Communication: Mobility status (HR and SpO2 during amb)   GP     Glyn Gerads Marie 07/15/2012, 10:30 AM  Lewis Shock, PT, DPT Pager #: [phone] Office #: [phone]

## 2012-07-15 NOTE — Progress Notes (Signed)
ANTIBIOTIC CONSULT NOTE - INITIAL  Pharmacy Consult for Unasyn and gentamicin Indication: endocarditis  Allergies  Allergen Reactions  . Codeine Nausea Only    Patient Measurements: Height: 5\' 6"  (167.6 cm) Weight: 177 lb 7.5 oz (80.5 kg) IBW/kg (Calculated) : 59.3 Adjusted Body Weight: 65  Vital Signs: Temp: 98.9 F (37.2 C) (04/22 0736) Temp src: Oral (04/22 1109) BP: 113/59 mmHg (04/22 1109) Pulse Rate: 85 (04/22 1109) Intake/Output from previous day: 04/21 0701 - 04/22 0700 In: 420 [P.O.:420] Out: 1650 [Urine:1650] Intake/Output from this shift: Total I/O In: 243 [P.O.:240; I.V.:3] Out: -   Labs:  Recent Labs  07/13/12 0408 07/14/12 0530 07/15/12 0420  WBC  --   --  7.5  HGB  --   --  9.3*  PLT  --   --  136*  CREATININE 1.01 1.23* 1.11*   Estimated Creatinine Clearance: 47.6 ml/min (by C-G formula based on Cr of 1.11). No results found for this basename: VANCOTROUGH, Leodis Binet, VANCORANDOM, GENTTROUGH, GENTPEAK, GENTRANDOM, TOBRATROUGH, TOBRAPEAK, TOBRARND, AMIKACINPEAK, AMIKACINTROU, AMIKACIN,  in the last 72 hours   Microbiology: Recent Results (from the past 720 hour(s))  CULTURE, BLOOD (ROUTINE X 2)     Status: None   Collection Time    06/29/12  5:00 PM      Result Value Range Status   Specimen Description BLOOD RIGHT ARM   Final   Special Requests BOTTLES DRAWN AEROBIC AND ANAEROBIC   Final   Culture  Setup Time 06/29/2012 20:35   Final   Culture     Final   Value: ENTEROCOCCUS SPECIES     Note: COMBINATION THERAPY OF HIGH DOSE AMPICILLIN OR VANCOMYCIN, PLUS AN AMINOGLYCOSIDE, IS USUALLY INDICATED FOR SERIOUS ENTEROCOCCAL INFECTIONS.     Note: Gram Stain Report Called to,Read Back By and Verified With: TONI FESTERMAN 06/30/12 1430 BY [email or web address]   Report Status 07/03/2012 FINAL   Final   Organism ID, Bacteria ENTEROCOCCUS SPECIES   Final  CULTURE, BLOOD (ROUTINE X 2)     Status: None   Collection Time    06/29/12  5:30 PM      Result Value  Range Status   Specimen Description BLOOD RIGHT HAND   Final   Special Requests BOTTLES DRAWN AEROBIC AND ANAEROBIC   Final   Culture  Setup Time 06/29/2012 20:35   Final   Culture     Final   Value: ENTEROCOCCUS SPECIES     Note: SUSCEPTIBILITIES PERFORMED ON PREVIOUS CULTURE WITHIN THE LAST 5 DAYS.     Note: Gram Stain Report Called to,Read Back By and Verified With: TONI FESTERMAN 06/30/12 1540 BY [email or web address]   Report Status 07/03/2012 FINAL   Final  URINE CULTURE     Status: None   Collection Time    06/29/12  6:21 PM      Result Value Range Status   Specimen Description URINE, CLEAN CATCH   Final   Special Requests NONE   Final   Culture  Setup Time 06/30/2012 01:46   Final   Colony Count >=100,000 COLONIES/ML   Final   Culture KLEBSIELLA PNEUMONIAE   Final   Report Status 07/01/2012 FINAL   Final   Organism ID, Bacteria KLEBSIELLA PNEUMONIAE   Final  MRSA PCR SCREENING     Status: None   Collection Time    07/12/12 12:54 AM      Result Value Range Status   MRSA by PCR NEGATIVE  NEGATIVE Final   Comment:  The GeneXpert MRSA Assay (FDA     approved for NASAL specimens     only), is one component of a     comprehensive MRSA colonization     surveillance program. It is not     intended to diagnose MRSA     infection nor to guide or     monitor treatment for     MRSA infections.  URINE CULTURE     Status: None   Collection Time    07/12/12  3:45 AM      Result Value Range Status   Specimen Description URINE, CLEAN CATCH   Final   Special Requests NONE   Final   Culture  Setup Time 07/12/2012 11:00   Final   Colony Count 50,000 COLONIES/ML   Final   Culture ENTEROCOCCUS SPECIES   Final   Report Status PENDING   Incomplete    Medical History: Past Medical History  Diagnosis Date  . Coronary artery disease   . Atrial fibrillation     Now NSR  . GERD (gastroesophageal reflux disease)   . Hyperlipidemia   . 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 Anemia   . Angina   . Shortness of breath     with activity  . Hypertension   . Blood transfusion   . Pneumonia   . H/O hiatal hernia   . Lupus     Medications:  Prescriptions prior to admission  Medication Sig Dispense Refill  . hydroxychloroquine (PLAQUENIL) 200 MG tablet Take 200 mg by mouth 2 (two) times daily.       Marland Kitchen ibuprofen (ADVIL,MOTRIN) 200 MG tablet Take 400 mg by mouth at bedtime.      . metolazone (ZAROXOLYN) 2.5 MG tablet Take 1 tablet (2.5 mg total) by mouth daily.  30 tablet  3  . metoprolol (LOPRESSOR) 50 MG tablet Take 1 tablet (50 mg total) by mouth 2 (two) times daily.  60 tablet  3  . pantoprazole (PROTONIX) 40 MG tablet TAKE 1 TABLET BY MOUTH EVERY MORNING  30 tablet  0  . potassium chloride SA (KLOR-CON M20) 20 MEQ tablet Take 2 tablets (40 mEq total) by mouth 2 (two) times daily.  120 tablet  3  . predniSONE (DELTASONE) 5 MG tablet Take 5 mg by mouth every morning.       Marland Kitchen rOPINIRole (REQUIP) 1 MG tablet Take 1 mg by mouth at bedtime. Daily at bedtime      . rosuvastatin (CRESTOR) 10 MG tablet Take 10 mg by mouth daily.        Marland Kitchen sulfaSALAzine (AZULFIDINE) 500 MG tablet Take 1,000 mg by mouth 2 (two) times daily.       Marland Kitchen warfarin (COUMADIN) 4 MG tablet Take 4 mg by mouth See admin instructions. 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  Blood cultures earlier this month were positive with enterococcus sensitive to ampicillin, vancomycin and gentamicin.    Goal of Therapy:  Gentamicin peak 3-4 mg/L, trough 1mg /L  Plan:   Unasyn 3g IV q6h - discussed with Dr. Algis Liming - may be able to narrow to ampicillin soon  Gentamicin 80mg  IV x 1 dose, then give 60mg  IV q12.  Monitor peak/trough values if therapy to continue > 3 days   Mickeal Skinner 07/15/2012,11:47  AM

## 2012-07-15 NOTE — Progress Notes (Addendum)
Subjective:  Patient denies any chest pain or shortness of breath. Denies any urinary complaints had low-grade fever. Blood cultures drawn in the ER approximately 2 weeks ago was positive for enterococcus urine culture also positive for enterococcus  Objective:  Vital Signs in the last 24 hours: Temp:  [97.9 F (36.6 C)-100.3 F (37.9 C)] 98.9 F (37.2 C) (04/22 0736) Pulse Rate:  [81-100] 94 (04/22 0736) Resp:  [17-31] 17 (04/22 0736) BP: (95-130)/(39-77) 113/52 mmHg (04/22 0736) SpO2:  [96 %-100 %] 96 % (04/22 0736)  Intake/Output from previous day: 04/21 0701 - 04/22 0700 In: 420 [P.O.:420] Out: 1650 [Urine:1650] Intake/Output from this shift: Total I/O In: 3 [I.V.:3] Out: -   Physical Exam: Neck: no adenopathy, no carotid bruit, no JVD and supple, symmetrical, trachea midline Lungs: Decreased breath sound at bases Heart: irregularly irregular rhythm, S1, S2 normal and 2/6 systolic murmur and soft S3 gallop noted Abdomen: soft, non-tender; bowel sounds normal; no masses,  no organomegaly Extremities: extremities normal, atraumatic, no cyanosis or edema  Lab Results:  Recent Labs  07/15/12 0420  WBC 7.5  HGB 9.3*  PLT 136*    Recent Labs  07/14/12 0530 07/15/12 0420  NA 130* 131*  K 4.0 3.8  CL 98 97  CO2 22 27  GLUCOSE 92 98  BUN 24* 22  CREATININE 1.23* 1.11*   No results found for this basename: TROPONINI, CK, MB,  in the last 72 hours Hepatic Function Panel No results found for this basename: PROT, ALBUMIN, AST, ALT, ALKPHOS, BILITOT, BILIDIR, IBILI,  in the last 72 hours No results found for this basename: CHOL,  in the last 72 hours No results found for this basename: PROTIME,  in the last 72 hours  Imaging: Imaging results have been reviewed and No results found.  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

## 2012-07-16 ENCOUNTER — Encounter (HOSPITAL_COMMUNITY): Payer: Self-pay | Admitting: Cardiovascular Disease

## 2012-07-16 LAB — RENAL FUNCTION PANEL
CO2: 25 mEq/L (ref 19–32)
Calcium: 8.8 mg/dL (ref 8.4–10.5)
GFR calc Af Amer: 51 mL/min — ABNORMAL LOW (ref >90)
GFR calc non Af Amer: 44 mL/min — ABNORMAL LOW (ref >90)
Sodium: 132 mEq/L — ABNORMAL LOW (ref 135–145)

## 2012-07-16 LAB — PROTIME-INR: INR: 2.24 — ABNORMAL HIGH (ref 0.00–1.49)

## 2012-07-16 MED ORDER — SODIUM CHLORIDE 0.9 % IV BOLUS (SEPSIS)
250.0000 mL | Freq: Once | INTRAVENOUS | Status: AC
  Administered 2012-07-16: 250 mL via INTRAVENOUS

## 2012-07-16 NOTE — Progress Notes (Signed)
Dr. Sharyn Lull & Dr. Luciana Axe notified of pt's low UOP; orders received;  Pt's HR dropped to 40s for less than a minute; BP stable; will continue to monitor closely

## 2012-07-16 NOTE — Progress Notes (Signed)
Subjective:  Appreciate infectious disease consult and help. Patient denies any chest pain or shortness of breath states feels a little better. Denies any fever chills denies diarrhea  Objective:  Vital Signs in the last 24 hours: Temp:  [97.9 F (36.6 C)-98.6 F (37 C)] 98.6 F (37 C) (04/23 1139) Pulse Rate:  [73-90] 78 (04/23 1139) Resp:  [14-38] 17 (04/23 1139) BP: (81-127)/(43-73) 120/52 mmHg (04/23 1139) SpO2:  [94 %-100 %] 100 % (04/23 1139) Weight:  [81.2 kg (179 lb 0.2 oz)] 81.2 kg (179 lb 0.2 oz) (04/23 0410)  Intake/Output from previous day: 04/22 0701 - 04/23 0700 In: 1313 [P.O.:750; I.V.:213; IV Piggyback:350] Out: 1250 [Urine:1250] Intake/Output from this shift: Total I/O In: 125 [I.V.:25; IV Piggyback:100] Out: 200 [Urine:200]  Physical Exam: Neck: no adenopathy, no carotid bruit, no JVD and supple, symmetrical, trachea midline Lungs: Decreased breath sound at bases air entry improved Heart: irregularly irregular rhythm, S1, S2 normal and Soft systolic murmur and S3 gallop noted Abdomen: soft, non-tender; bowel sounds normal; no masses,  no organomegaly Extremities: extremities normal, atraumatic, no cyanosis or edema  Lab Results:  Recent Labs  07/15/12 0420  WBC 7.5  HGB 9.3*  PLT 136*    Recent Labs  07/14/12 0530 07/15/12 0420  NA 130* 131*  K 4.0 3.8  CL 98 97  CO2 22 27  GLUCOSE 92 98  BUN 24* 22  CREATININE 1.23* 1.11*   No results found for this basename: TROPONINI, CK, MB,  in the last 72 hours Hepatic Function Panel No results found for this basename: PROT, ALBUMIN, AST, ALT, ALKPHOS, BILITOT, BILIDIR, IBILI,  in the last 72 hours No results found for this basename: CHOL,  in the last 72 hours No results found for this basename: PROTIME,  in the last 72 hours  Imaging: Imaging results have been reviewed and No results found.  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

## 2012-07-16 NOTE — Progress Notes (Signed)
[facility]  Day #2 ampicillin Day # 2 gentamicin  Subjective: No new complaints   Antibiotics:  Anti-infectives   Start     Dose/Rate Route Frequency Ordered Stop   07/15/12 2200  gentamicin (GARAMYCIN) IVPB 60 mg     60 mg 100 mL/hr over 30 Minutes Intravenous Every 12 hours 07/15/12 1159     07/15/12 1600  ampicillin (OMNIPEN) 2 g in sodium chloride 0.9 % 50 mL IVPB     2 g 150 mL/hr over 20 Minutes Intravenous 6 times per day 07/15/12 1340     07/15/12 1200  gentamicin (GARAMYCIN) IVPB 80 mg     80 mg 100 mL/hr over 30 Minutes Intravenous Every 12 hours 07/15/12 1036 07/15/12 1445   07/15/12 1100  Ampicillin-Sulbactam (UNASYN) 3 g in sodium chloride 0.9 % 100 mL IVPB  Status:  Discontinued     3 g 100 mL/hr over 60 Minutes Intravenous Every 6 hours 07/15/12 1036 07/15/12 1338   07/12/12 1000  hydroxychloroquine (PLAQUENIL) tablet 200 mg     200 mg Oral 2 times daily 07/12/12 0040        Medications: Scheduled Meds: . ampicillin (OMNIPEN) IV  2 g Intravenous Q4H  . atorvastatin  10 mg Oral q1800  . diltiazem  30 mg Oral Q12H  . furosemide  40 mg Oral Daily  . gentamicin  60 mg Intravenous Q12H  . hydroxychloroquine  200 mg Oral BID  . lisinopril  2.5 mg Oral Daily  . metoprolol tartrate  12.5 mg Oral BID  . pantoprazole  40 mg Oral Daily  . potassium chloride SA  20 mEq Oral BID  . predniSONE  5 mg Oral Q breakfast  . rOPINIRole  1 mg Oral QHS  . sodium chloride  3 mL Intravenous Q12H  . sulfaSALAzine  1,000 mg Oral BID  . warfarin  4 mg Oral Custom  . Warfarin - Physician Dosing Inpatient   Does not apply q1800   Continuous Infusions: . sodium chloride 10 mL/hr at 07/16/12 0700   PRN Meds:.sodium chloride, acetaminophen, sodium chloride   Objective: Weight change:   Intake/Output Summary (Last 24 hours) at 07/16/12 1808 Last data filed at 07/16/12 1400  Gross per 24 hour  Intake    695 ml  Output   1050 ml  Net   -355 ml    Blood pressure 98/50, pulse 70, temperature 98.4 F (36.9 C), temperature source Oral, resp. rate 23, height 5\' 6"  (1.676 m), weight 179 lb 0.2 oz (81.2 kg), SpO2 100.00%. Temp:  [97.9 F (36.6 C)-98.6 F (37 C)] 98.4 F (36.9 C) (04/23 1606) Pulse Rate:  [70-90] 70 (04/23 1606) Resp:  [17-29] 23 (04/23 1606) BP: (98-127)/(44-73) 98/50 mmHg (04/23 1606) SpO2:  [95 %-100 %] 100 % (04/23 1606) Weight:  [179 lb 0.2 oz (81.2 kg)] 179 lb 0.2 oz (81.2 kg) (04/23 0410)  Physical Exam: General: Alert and awake, oriented x3, not in any acute distress.  HEENT: anicteric sclera,, EOMI, oropharynx clear and without exudate  CVS irr, regular rate, normal r, II/VI SEM  Chest: clear to auscultation bilaterally, no wheezing, rales or rhonchi  Abdomen: soft nontender, nondistended, normal bowel sounds,  Extremities: no clubbing or edema noted bilaterally  Skin: no acute rashes, residual healed rash on leg from this fall  Neuro: nonfocal, strength and sensation intact  Lab Results:  Recent Labs  07/15/12 0420  WBC 7.5  HGB 9.3*  HCT 27.0*  PLT  136*    BMET  Recent Labs  07/14/12 0530 07/15/12 0420  NA 130* 131*  K 4.0 3.8  CL 98 97  CO2 22 27  GLUCOSE 92 98  BUN 24* 22  CREATININE 1.23* 1.11*  CALCIUM 8.5 8.9    Micro Results: Recent Results (from the past 240 hour(s))  MRSA PCR SCREENING     Status: None   Collection Time    07/12/12 12:54 AM      Result Value Range Status   MRSA by PCR NEGATIVE  NEGATIVE Final   Comment:            The GeneXpert MRSA Assay (FDA     approved for NASAL specimens     only), is one component of a     comprehensive MRSA colonization     surveillance program. It is not     intended to diagnose MRSA     infection nor to guide or     monitor treatment for     MRSA infections.  URINE CULTURE     Status: None   Collection Time    07/12/12  3:45 AM      Result Value Range Status   Specimen Description URINE, CLEAN CATCH   Final    Special Requests NONE   Final   Culture  Setup Time 07/12/2012 11:00   Final   Colony Count 50,000 COLONIES/ML   Final   Culture ENTEROCOCCUS SPECIES   Final   Report Status 07/15/2012 FINAL   Final   Organism ID, Bacteria ENTEROCOCCUS SPECIES   Final  CULTURE, BLOOD (ROUTINE X 2)     Status: None   Collection Time    07/15/12 10:25 AM      Result Value Range Status   Specimen Description BLOOD RIGHT FOREARM   Final   Special Requests BOTTLES DRAWN AEROBIC AND ANAEROBIC 10CC   Final   Culture  Setup Time 07/15/2012 13:55   Final   Culture     Final   Value: GPCR     Note: Gram Stain Report Called to,Read Back By and Verified With: NIKI BEASLEY@1142  ON [id number] BY [facility]   Report Status PENDING   Incomplete  CULTURE, BLOOD (ROUTINE X 2)     Status: None   Collection Time    07/15/12 10:31 AM      Result Value Range Status   Specimen Description BLOOD RIGHT WRIST   Final   Special Requests BOTTLES DRAWN AEROBIC AND ANAEROBIC 10CC   Final   Culture  Setup Time 07/15/2012 13:55   Final   Culture     Final   Value: GRAM POSITIVE COCCI IN PAIRS     Note: Gram Stain Report Called to,Read Back By and Verified With: NIKI BEASLEY@1056  ON [id number] BY [facility]   Report Status PENDING   Incomplete  URINE CULTURE     Status: None   Collection Time    07/15/12  1:09 PM      Result Value Range Status   Specimen Description URINE, RANDOM   Final   Special Requests NONE   Final   Culture  Setup Time 07/15/2012 13:27   Final   Colony Count 30,000 COLONIES/ML   Final   Culture GRAM NEGATIVE RODS   Final   Report Status PENDING   Incomplete    Studies/Results: No results found.    Assessment/Plan: [name] is a 72 y.o. female with a recurrence of enterococcal bacteremia with mitral valve  and now prosthetic aortic valve endocarditis  #1 Recurrent Enterococcal endocarditis now involving the prosthetic aortic valve   Per Dr. Dorris Fetch the pt is NOt an operative candidate   --continue AMP  and GENT and monitor for rash, and for renal toxicity  --If she can tolerate this regimen will push for repeat course of 6 weeks   --If she cannot due to beta lactam allergy options will be vancomycin and gentamicin which will carry a high risk of nephrotoxicity or alternatively high-dose daptomycin all for 6 weeks.  --would NOT place PICC until we have proven clearance of her bacteremia   # 2 GNR in urine at 30k not significant  #3 ? History Beta lactam rash? On amp, ceftriaxone before, monitor closely       LOS: 5 days   [name] 07/16/2012, 6:08 PM

## 2012-07-17 LAB — PROTIME-INR
INR: 2.52 — ABNORMAL HIGH (ref 0.00–1.49)
Prothrombin Time: 26 seconds — ABNORMAL HIGH (ref 11.6–15.2)

## 2012-07-17 LAB — BASIC METABOLIC PANEL
BUN: 18 mg/dL (ref 6–23)
GFR calc Af Amer: 58 mL/min — ABNORMAL LOW (ref >90)
GFR calc non Af Amer: 50 mL/min — ABNORMAL LOW (ref >90)
Potassium: 4.3 mEq/L (ref 3.5–5.1)
Sodium: 132 mEq/L — ABNORMAL LOW (ref 135–145)

## 2012-07-17 LAB — URINE CULTURE

## 2012-07-17 MED ORDER — SODIUM CHLORIDE 0.9 % IV SOLN: 250.0000 mL | INTRAVENOUS | Status: DC | PRN

## 2012-07-17 MED ORDER — ENSURE COMPLETE PO LIQD
237.0000 mL | Freq: Three times a day (TID) | ORAL | Status: DC
  Administered 2012-07-17 – 2012-07-19 (×4): 237 mL via ORAL

## 2012-07-17 MED ORDER — ENSURE PLUS PO LIQD
237.0000 mL | Freq: Three times a day (TID) | ORAL | Status: DC
Start: 2012-07-17 — End: 2012-07-17

## 2012-07-17 NOTE — Progress Notes (Signed)
[facility]   Day #3 ampicillin Day # 3 gentamicin  Subjective: Feels tired, less UOP noted by RN overnight   Antibiotics:  Anti-infectives   Start     Dose/Rate Route Frequency Ordered Stop   07/15/12 2200  gentamicin (GARAMYCIN) IVPB 60 mg     60 mg 100 mL/hr over 30 Minutes Intravenous Every 12 hours 07/15/12 1159     07/15/12 1600  ampicillin (OMNIPEN) 2 g in sodium chloride 0.9 % 50 mL IVPB     2 g 150 mL/hr over 20 Minutes Intravenous 6 times per day 07/15/12 1340     07/15/12 1200  gentamicin (GARAMYCIN) IVPB 80 mg     80 mg 100 mL/hr over 30 Minutes Intravenous Every 12 hours 07/15/12 1036 07/15/12 1445   07/15/12 1100  Ampicillin-Sulbactam (UNASYN) 3 g in sodium chloride 0.9 % 100 mL IVPB  Status:  Discontinued     3 g 100 mL/hr over 60 Minutes Intravenous Every 6 hours 07/15/12 1036 07/15/12 1338   07/12/12 1000  hydroxychloroquine (PLAQUENIL) tablet 200 mg     200 mg Oral 2 times daily 07/12/12 0040        Medications: Scheduled Meds: . ampicillin (OMNIPEN) IV  2 g Intravenous Q4H  . atorvastatin  10 mg Oral q1800  . diltiazem  30 mg Oral Q12H  . gentamicin  60 mg Intravenous Q12H  . hydroxychloroquine  200 mg Oral BID  . lisinopril  2.5 mg Oral Daily  . metoprolol tartrate  12.5 mg Oral BID  . pantoprazole  40 mg Oral Daily  . predniSONE  5 mg Oral Q breakfast  . rOPINIRole  1 mg Oral QHS  . sodium chloride  3 mL Intravenous Q12H  . sulfaSALAzine  1,000 mg Oral BID  . warfarin  4 mg Oral Custom  . Warfarin - Physician Dosing Inpatient   Does not apply q1800   Continuous Infusions: . sodium chloride 10 mL/hr at 07/16/12 2255   PRN Meds:.sodium chloride, acetaminophen, sodium chloride   Objective: Weight change: 3.5 oz (0.1 kg)  Intake/Output Summary (Last 24 hours) at 07/17/12 1002 Last data filed at 07/17/12 1000  Gross per 24 hour  Intake   1190 ml  Output    450 ml  Net    740 ml   Blood pressure 126/57, pulse  83, temperature 98.7 F (37.1 C), temperature source Oral, resp. rate 17, height 5\' 6"  (1.676 m), weight 179 lb 3.7 oz (81.3 kg), SpO2 100.00%. Temp:  [97.8 F (36.6 C)-98.7 F (37.1 C)] 98.7 F (37.1 C) (04/24 0735) Pulse Rate:  [53-88] 83 (04/24 0900) Resp:  [17-36] 17 (04/24 0900) BP: (98-126)/(48-62) 126/57 mmHg (04/24 0735) SpO2:  [91 %-100 %] 100 % (04/24 0900) Weight:  [179 lb 3.7 oz (81.3 kg)] 179 lb 3.7 oz (81.3 kg) (04/24 0000)  Physical Exam: General: Alert and awake, oriented x3, not in any acute distress.  HEENT: anicteric sclera,, EOMI, oropharynx clear and without exudate  CVS irr, regular rate, normal r, II/VI SEM  Chest: clear to auscultation bilaterally, no wheezing, rales or rhonchi  Abdomen: soft nontender, nondistended, normal bowel sounds,  Extremities: no clubbing or edema noted bilaterally  Skin: no acute rashes, residual healed rash on leg from this fall  Neuro: nonfocal, strength and sensation intact  Lab Results:  Recent Labs  07/15/12 0420  WBC 7.5  HGB 9.3*  HCT 27.0*  PLT 136*    BMET  Recent Labs  07/16/12 2114 07/17/12 0458  NA 132* 132*  K 4.3 4.3  CL 96 98  CO2 25 25  GLUCOSE 110* 93  BUN 20 18  CREATININE 1.19* 1.07  CALCIUM 8.8 8.6    Micro Results: Recent Results (from the past 240 hour(s))  MRSA PCR SCREENING     Status: None   Collection Time    07/12/12 12:54 AM      Result Value Range Status   MRSA by PCR NEGATIVE  NEGATIVE Final   Comment:            The GeneXpert MRSA Assay (FDA     approved for NASAL specimens     only), is one component of a     comprehensive MRSA colonization     surveillance program. It is not     intended to diagnose MRSA     infection nor to guide or     monitor treatment for     MRSA infections.  URINE CULTURE     Status: None   Collection Time    07/12/12  3:45 AM      Result Value Range Status   Specimen Description URINE, CLEAN CATCH   Final   Special Requests NONE   Final    Culture  Setup Time 07/12/2012 11:00   Final   Colony Count 50,000 COLONIES/ML   Final   Culture ENTEROCOCCUS SPECIES   Final   Report Status 07/15/2012 FINAL   Final   Organism ID, Bacteria ENTEROCOCCUS SPECIES   Final  CULTURE, BLOOD (ROUTINE X 2)     Status: None   Collection Time    07/15/12 10:25 AM      Result Value Range Status   Specimen Description BLOOD RIGHT FOREARM   Final   Special Requests BOTTLES DRAWN AEROBIC AND ANAEROBIC 10CC   Final   Culture  Setup Time 07/15/2012 13:55   Final   Culture     Final   Value: GRAM POSITIVE COCCI IN PAIRS     Note: Gram Stain Report Called to,Read Back By and Verified With: NIKI BEASLEY@1142  ON [id number] BY [facility]   Report Status PENDING   Incomplete  CULTURE, BLOOD (ROUTINE X 2)     Status: None   Collection Time    07/15/12 10:31 AM      Result Value Range Status   Specimen Description BLOOD RIGHT WRIST   Final   Special Requests BOTTLES DRAWN AEROBIC AND ANAEROBIC 10CC   Final   Culture  Setup Time 07/15/2012 13:55   Final   Culture     Final   Value: GRAM POSITIVE COCCI IN PAIRS     Note: Gram Stain Report Called to,Read Back By and Verified With: NIKI BEASLEY@1056  ON [id number] BY [facility]   Report Status PENDING   Incomplete  URINE CULTURE     Status: None   Collection Time    07/15/12  1:09 PM      Result Value Range Status   Specimen Description URINE, RANDOM   Final   Special Requests NONE   Final   Culture  Setup Time 07/15/2012 13:27   Final   Colony Count 30,000 COLONIES/ML   Final   Culture KLEBSIELLA PNEUMONIAE   Final   Report Status 07/17/2012 FINAL   Final   Organism ID, Bacteria KLEBSIELLA PNEUMONIAE   Final    Studies/Results: No results found.    Assessment/Plan: TASHEBA HENSON is a 72 y.o. female with a recurrence of  enterococcal bacteremia with mitral valve and now prosthetic aortic valve endocarditis  #1 Recurrent Enterococcal endocarditis now involving the prosthetic aortic valve   Per Dr. Dorris Fetch  the pt is NOt an operative candidate   --continue AMP and GENT and monitor for rash, and for renal toxicity  --If she can tolerate this regimen will push for repeat course of 6 weeks   --If she cannot due to beta lactam allergy options will be vancomycin and gentamicin which will carry a high risk of nephrotoxicity or alternatively high-dose daptomycin all for 6 weeks.  --would NOT place PICC until we have proven clearance of her bacteremia   # 2 GNR in urine at 30k not significant  #3 ? History Beta lactam rash? On amp, ceftriaxone before, monitor closely   #4 Creatinine up slightly UOP down;   I WOULD DC Her ACEI and give her small IVF bolus defer to primary team      LOS: 6 days   [name] 07/17/2012, 10:02 AM

## 2012-07-17 NOTE — Progress Notes (Signed)
Physical Therapy Treatment Patient Details Name: [name] MRN: [id number] DOB: 1940/12/31 Today's Date: 07/17/2012 Time: 1191-4782 PT Time Calculation (min): 24 min  PT Assessment / Plan / Recommendation Comments on Treatment Session  Pt reports she feels more tired today than she has been but contributes it to not sleeping well last night but still agreeable to participate in therapy.   Trialed session without supplemental 02 due to pt at 99% 2L upon arrival & sats remained in 90's while performing LE exercises on RA.   02 sats did decrease to mid 80's RA while ambulating but quickly recovered back to 90's with pursed lip breathing.  HR remained ~110-115 bpm with ambulation.  RN made aware    Follow Up Recommendations  Home health PT;Supervision/Assistance - 24 hour     Does the patient have the potential to tolerate intense rehabilitation     Barriers to Discharge        Equipment Recommendations  Rolling walker with 5" wheels    Recommendations for Other Services    Frequency Min 3X/week   Plan Discharge plan remains appropriate;Frequency remains appropriate    Precautions / Restrictions Precautions Precautions: Fall Restrictions Weight Bearing Restrictions: No       Mobility  Bed Mobility Bed Mobility: Not assessed Transfers Transfers: Sit to Stand;Stand to Sit Sit to Stand: 4: Min guard;With upper extremity assist;With armrests;From bed;From chair/3-in-1 Stand to Sit: 4: Min guard;With upper extremity assist;With armrests;To bed;To chair/3-in-1 Details for Transfer Assistance: Cues for safe hand placement & body positioning before sitting.   Ambulation/Gait Ambulation/Gait Assistance: 4: Min guard Ambulation Distance (Feet): 120 Feet Assistive device: Rolling walker Ambulation/Gait Assistance Details: Trialed ambulation without supplemental 02.  Sats decreased to mid 80's but quickly increased back to 90's with pursed lip breathing.   Pt has tendency to hold her  breath.   Used RW to ensure stability.  Cues for safe use of RW & how to [facility] through narrow spaces (sidestepping technique).   Gait Pattern: Step-through pattern;Decreased stride length;Decreased hip/knee flexion - left;Decreased hip/knee flexion - right Gait velocity: slow Stairs: No Wheelchair Mobility Wheelchair Mobility: No    Exercises General Exercises - Lower Extremity Long Arc Quad: Both;10 reps;Seated Hip Flexion/Marching: Both;10 reps;Seated Toe Raises: Both;10 reps;Seated Heel Raises: Both;10 reps;Seated     PT Goals Acute Rehab PT Goals Time For Goal Achievement: 07/27/12 Potential to Achieve Goals: Good Pt will go Sit to Stand: with supervision PT Goal: Sit to Stand - Progress: Progressing toward goal Pt will go Stand to Sit: with supervision PT Goal: Stand to Sit - Progress: Progressing toward goal Pt will Stand: with supervision;3 - 5 min;with no upper extremity support Pt will Ambulate: 51 - 150 feet;with cane;with supervision PT Goal: Ambulate - Progress: Progressing toward goal Pt will Perform Home Exercise Program: with supervision, verbal cues required/provided PT Goal: Perform Home Exercise Program - Progress: Progressing toward goal  Visit Information  Last PT Received On: 07/17/12 Assistance Needed: +1    Subjective Data      Cognition  Cognition Arousal/Alertness: Awake/alert Behavior During Therapy: WFL for tasks assessed/performed Overall Cognitive Status: Within Functional Limits for tasks assessed    Balance     End of Session PT - End of Session Equipment Utilized During Treatment: Gait belt Activity Tolerance: Patient tolerated treatment well Patient left: in chair;with call bell/phone within reach;with nursing in room Nurse Communication: Mobility status;Other (comment) (02 sats)     Verdell Face, PTA [phone] 07/17/2012

## 2012-07-17 NOTE — Progress Notes (Signed)
Subjective:  Patient denies any chest pain or shortness of breath. Denies any urinary complaints. Urine output yesterday reduced received fluid bolus Lasix was DC'd renal function has slightly improved this a.m. feels tired and weak  Objective:  Vital Signs in the last 24 hours: Temp:  [97.8 F (36.6 C)-98.7 F (37.1 C)] 98.7 F (37.1 C) (04/24 0735) Pulse Rate:  [53-88] 71 (04/24 1100) Resp:  [17-36] 27 (04/24 1100) BP: (98-126)/(48-62) 126/57 mmHg (04/24 0735) SpO2:  [91 %-100 %] 100 % (04/24 1100) Weight:  [81.3 kg (179 lb 3.7 oz)] 81.3 kg (179 lb 3.7 oz) (04/24 0000)  Intake/Output from previous day: 04/23 0701 - 04/24 0700 In: 995 [P.O.:450; I.V.:195; IV Piggyback:350] Out: 450 [Urine:450] Intake/Output from this shift: Total I/O In: 330 [P.O.:240; I.V.:40; IV Piggyback:50] Out: -   Physical Exam: Neck: no adenopathy, no carotid bruit, no JVD and supple, symmetrical, trachea midline Lungs: clear to auscultation bilaterally Heart: irregularly irregular rhythm, S1, S2 normal and 2/6 systolic murmur noted Abdomen: soft, non-tender; bowel sounds normal; no masses,  no organomegaly Extremities: extremities normal, atraumatic, no cyanosis or edema  Lab Results:  Recent Labs  07/15/12 0420  WBC 7.5  HGB 9.3*  PLT 136*    Recent Labs  07/16/12 2114 07/17/12 0458  NA 132* 132*  K 4.3 4.3  CL 96 98  CO2 25 25  GLUCOSE 110* 93  BUN 20 18  CREATININE 1.19* 1.07   No results found for this basename: TROPONINI, CK, MB,  in the last 72 hours Hepatic Function Panel  Recent Labs  07/16/12 2114  ALBUMIN 2.3*   No results found for this basename: CHOL,  in the last 72 hours No results found for this basename: PROTIME,  in the last 72 hours  Imaging: Imaging results have been reviewed and No results found.  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

## 2012-07-18 LAB — CBC
MCH: 30.1 pg (ref 26.0–34.0)
MCHC: 33.3 g/dL (ref 30.0–36.0)
Platelets: 106 10*3/uL — ABNORMAL LOW (ref 150–400)
RBC: 2.79 MIL/uL — ABNORMAL LOW (ref 3.87–5.11)
RDW: 14.2 % (ref 11.5–15.5)

## 2012-07-18 LAB — BASIC METABOLIC PANEL
BUN: 17 mg/dL (ref 6–23)
Calcium: 8.7 mg/dL (ref 8.4–10.5)
GFR calc non Af Amer: 45 mL/min — ABNORMAL LOW (ref >90)
Glucose, Bld: 97 mg/dL (ref 70–99)
Sodium: 135 mEq/L (ref 135–145)

## 2012-07-18 MED ORDER — AMPICILLIN SODIUM 2 G IJ SOLR
2.0000 g | INTRAMUSCULAR | Status: DC
  Administered 2012-07-19 – 2012-07-22 (×22): 2 g via INTRAVENOUS
  Filled 2012-07-18 (×26): qty 2000

## 2012-07-18 NOTE — Progress Notes (Addendum)
[facility] ' Day # 4 ampicillin Day # 4 gentamicin  Subjective: Feels better today   Antibiotics:  Anti-infectives   Start     Dose/Rate Route Frequency Ordered Stop   07/15/12 2200  gentamicin (GARAMYCIN) IVPB 60 mg     60 mg 100 mL/hr over 30 Minutes Intravenous Every 12 hours 07/15/12 1159     07/15/12 1600  ampicillin (OMNIPEN) 2 g in sodium chloride 0.9 % 50 mL IVPB     2 g 150 mL/hr over 20 Minutes Intravenous 6 times per day 07/15/12 1340     07/15/12 1200  gentamicin (GARAMYCIN) IVPB 80 mg     80 mg 100 mL/hr over 30 Minutes Intravenous Every 12 hours 07/15/12 1036 07/15/12 1445   07/15/12 1100  Ampicillin-Sulbactam (UNASYN) 3 g in sodium chloride 0.9 % 100 mL IVPB  Status:  Discontinued     3 g 100 mL/hr over 60 Minutes Intravenous Every 6 hours 07/15/12 1036 07/15/12 1338   07/12/12 1000  hydroxychloroquine (PLAQUENIL) tablet 200 mg     200 mg Oral 2 times daily 07/12/12 0040        Medications: Scheduled Meds: . ampicillin (OMNIPEN) IV  2 g Intravenous Q4H  . atorvastatin  10 mg Oral q1800  . diltiazem  30 mg Oral Q12H  . feeding supplement  237 mL Oral TID WC  . gentamicin  60 mg Intravenous Q12H  . hydroxychloroquine  200 mg Oral BID  . lisinopril  2.5 mg Oral Daily  . metoprolol tartrate  12.5 mg Oral BID  . pantoprazole  40 mg Oral Daily  . predniSONE  5 mg Oral Q breakfast  . rOPINIRole  1 mg Oral QHS  . sodium chloride  3 mL Intravenous Q12H  . sulfaSALAzine  1,000 mg Oral BID  . warfarin  4 mg Oral Custom  . Warfarin - Physician Dosing Inpatient   Does not apply q1800   Continuous Infusions: . sodium chloride 1,000 mL (07/17/12 2204)   PRN Meds:.sodium chloride, acetaminophen, sodium chloride   Objective: Weight change: 4 lb 13.6 oz (2.2 kg)  Intake/Output Summary (Last 24 hours) at 07/18/12 1036 Last data filed at 07/18/12 1016  Gross per 24 hour  Intake   1758 ml  Output    675 ml  Net   1083 ml    Blood pressure 133/58, pulse 87, temperature 98.1 F (36.7 C), temperature source Oral, resp. rate 29, height 5\' 6"  (1.676 m), weight 184 lb 1.4 oz (83.5 kg), SpO2 94.00%. Temp:  [97.6 F (36.4 C)-98.4 F (36.9 C)] 98.1 F (36.7 C) (04/25 0803) Pulse Rate:  [61-128] 87 (04/25 1012) Resp:  [19-37] 29 (04/25 0803) BP: (95-147)/(57-72) 133/58 mmHg (04/25 1012) SpO2:  [84 %-100 %] 94 % (04/25 0830) Weight:  [184 lb 1.4 oz (83.5 kg)] 184 lb 1.4 oz (83.5 kg) (04/25 0155)  Physical Exam: General: Alert and awake, oriented x3, not in any acute distress.  HEENT: anicteric sclera,, EOMI, oropharynx clear and without exudate  CVS irr, regular rate, normal r, II/VI SEM  Chest: clear to auscultation bilaterally, no wheezing, rales or rhonchi  Abdomen: soft nontender, nondistended, normal bowel sounds,  Extremities: no clubbing or edema noted bilaterally  Skin: no acute rashes, residual healed rash on leg from this fall  Neuro: nonfocal, strength and sensation intact  Lab Results:  Recent Labs  07/18/12 0500  WBC 6.2  HGB 8.4*  HCT 25.2*  PLT  106*    BMET  Recent Labs  07/17/12 0458 07/18/12 0500  NA 132* 135  K 4.3 3.7  CL 98 100  CO2 25 25  GLUCOSE 93 97  BUN 18 17  CREATININE 1.07 1.17*  CALCIUM 8.6 8.7    Micro Results: Recent Results (from the past 240 hour(s))  MRSA PCR SCREENING     Status: None   Collection Time    07/12/12 12:54 AM      Result Value Range Status   MRSA by PCR NEGATIVE  NEGATIVE Final   Comment:            The GeneXpert MRSA Assay (FDA     approved for NASAL specimens     only), is one component of a     comprehensive MRSA colonization     surveillance program. It is not     intended to diagnose MRSA     infection nor to guide or     monitor treatment for     MRSA infections.  URINE CULTURE     Status: None   Collection Time    07/12/12  3:45 AM      Result Value Range Status   Specimen Description URINE, CLEAN CATCH   Final    Special Requests NONE   Final   Culture  Setup Time 07/12/2012 11:00   Final   Colony Count 50,000 COLONIES/ML   Final   Culture ENTEROCOCCUS SPECIES   Final   Report Status 07/15/2012 FINAL   Final   Organism ID, Bacteria ENTEROCOCCUS SPECIES   Final  CULTURE, BLOOD (ROUTINE X 2)     Status: None   Collection Time    07/15/12 10:25 AM      Result Value Range Status   Specimen Description BLOOD RIGHT FOREARM   Final   Special Requests BOTTLES DRAWN AEROBIC AND ANAEROBIC 10CC   Final   Culture  Setup Time 07/15/2012 13:55   Final   Culture     Final   Value: GRAM POSITIVE COCCI IN PAIRS     Note: Gram Stain Report Called to,Read Back By and Verified With: NIKI BEASLEY@1142  ON [id number] BY [facility]   Report Status PENDING   Incomplete  CULTURE, BLOOD (ROUTINE X 2)     Status: None   Collection Time    07/15/12 10:31 AM      Result Value Range Status   Specimen Description BLOOD RIGHT WRIST   Final   Special Requests BOTTLES DRAWN AEROBIC AND ANAEROBIC 10CC   Final   Culture  Setup Time 07/15/2012 13:55   Final   Culture     Final   Value: GRAM POSITIVE COCCI IN PAIRS     Note: Gram Stain Report Called to,Read Back By and Verified With: NIKI BEASLEY@1056  ON [id number] BY [facility]   Report Status PENDING   Incomplete  URINE CULTURE     Status: None   Collection Time    07/15/12  1:09 PM      Result Value Range Status   Specimen Description URINE, RANDOM   Final   Special Requests NONE   Final   Culture  Setup Time 07/15/2012 13:27   Final   Colony Count 30,000 COLONIES/ML   Final   Culture KLEBSIELLA PNEUMONIAE   Final   Report Status 07/17/2012 FINAL   Final   Organism ID, Bacteria KLEBSIELLA PNEUMONIAE   Final  CULTURE, BLOOD (ROUTINE X 2)     Status: None  Collection Time    07/16/12  6:30 PM      Result Value Range Status   Specimen Description BLOOD RIGHT ARM   Final   Special Requests BOTTLES DRAWN AEROBIC AND ANAEROBIC 10CC   Final   Culture  Setup Time 07/17/2012 04:51   Final    Culture     Final   Value:        BLOOD CULTURE RECEIVED NO GROWTH TO DATE CULTURE WILL BE HELD FOR 5 DAYS BEFORE ISSUING A FINAL NEGATIVE REPORT   Report Status PENDING   Incomplete  CULTURE, BLOOD (ROUTINE X 2)     Status: None   Collection Time    07/16/12  6:40 PM      Result Value Range Status   Specimen Description BLOOD LEFT ARM   Final   Special Requests BOTTLES DRAWN AEROBIC AND ANAEROBIC 10CC   Final   Culture  Setup Time 07/17/2012 04:51   Final   Culture     Final   Value:        BLOOD CULTURE RECEIVED NO GROWTH TO DATE CULTURE WILL BE HELD FOR 5 DAYS BEFORE ISSUING A FINAL NEGATIVE REPORT   Report Status PENDING   Incomplete    Studies/Results: No results found.    Assessment/Plan: [name] is a 72 y.o. 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 Dorris Fetch the pt is NOt an operative candidate   --continue AMP and GENT and monitor for rash, and for renal toxicity  --If she can tolerate this regimen will push for repeat course of 6 weeks   --If she cannot due to beta lactam allergy options will be vancomycin and gentamicin which will carry a high risk of nephrotoxicity or alternatively high-dose daptomycin all for 6 weeks.  --would NOT place PICC until we have proven clearance of her bacteremia   # 2 Amp R kleb PNA  in urine at 30k not significant (it is Natasha Bence S in any case)  #3 ? History Beta lactam rash? On amp, ceftriaxone before, monitor closely   #4 Creatinine up slightly UOP down;   I WOULD DC Her ACEI and give her small IVF bolus defer to primary team  Dr. Luciana Axe is covering this weekend.    LOS: 7 days   [name] 07/18/2012, 10:36 AM

## 2012-07-18 NOTE — Evaluation (Signed)
Occupational Therapy Evaluation Patient Details Name: [name] MRN: [id number] DOB: 01-26-1941 Today's Date: 07/18/2012 Time: 4098-1191 OT Time Calculation (min): 24 min  OT Assessment / Plan / Recommendation Clinical Impression  Pt is a 72 yr old female admitted with SOB and fatigue.  Pt now with endocarditis and heart failure.  Presents with limited endurance and overall need for min guard to min assist for most selfcare tasks.  Feel pt will benefit from acute care OT to help increase overall independence and endurance for basic selfcare tasks.  Pt plans to discharge to her daughters house post acute.  Feel she will benefit from [facility] at discharge as well.      OT Assessment  Patient needs continued OT Services    Follow Up Recommendations  Home health OT    Barriers to Discharge None    Equipment Recommendations  3 in 1 bedside comode       Frequency  Min 2X/week    Precautions / Restrictions Precautions Precautions: Fall Restrictions Weight Bearing Restrictions: No   Pertinent Vitals/Pain HR 92 at rest increasing to 128 with standing at the sink.  O2 sats 94% at rest on 2Ls    ADL  Eating/Feeding: Simulated;Independent Where Assessed - Eating/Feeding: Chair Grooming: Performed;Min guard Where Assessed - Grooming: Supported standing Upper Body Bathing: Simulated;Set up Where Assessed - Upper Body Bathing: Unsupported sitting Lower Body Bathing: Simulated;Min guard Where Assessed - Lower Body Bathing: Unsupported sit to stand Upper Body Dressing: Simulated;Set up Where Assessed - Upper Body Dressing: Unsupported sitting Lower Body Dressing: Performed;Min guard Where Assessed - Lower Body Dressing: Unsupported sit to stand Toilet Transfer: Performed;Min guard Toilet Transfer Method: Surveyor, minerals: Materials engineer and Hygiene: Performed;Minimal assistance Where Assessed - Engineer, mining  and Hygiene: Sit to stand from 3-in-1 or toilet Transfers/Ambulation Related to ADLs: Pt overall min guard assist for mobility without use of assistive device, just to the sink and to the bedside chair in the room. ADL Comments: Pt with overall decreased endurance.  Able to stand at the sink for grooming activities for 4 mins with dyspnea 2/4 and O2 sats 87% on 2Ls per st monitor.  HR increasing from 92 at rest to 128 with standing.      OT Diagnosis: Generalized weakness  OT Problem List: Decreased strength;Decreased activity tolerance;Impaired balance (sitting and/or standing);Decreased knowledge of use of DME or AE OT Treatment Interventions: Self-care/ADL training;Therapeutic activities;Energy conservation;Patient/family education;DME and/or AE instruction;Balance training   OT Goals Acute Rehab OT Goals OT Goal Formulation: With patient Time For Goal Achievement: 08/01/12 Potential to Achieve Goals: Good ADL Goals Pt Will Perform Grooming: with modified independence;Standing at sink ADL Goal: Grooming - Progress: Goal set today Pt Will Perform Upper Body Bathing: with modified independence;Sit to stand from bed ADL Goal: Upper Body Bathing - Progress: Goal set today Pt Will Perform Lower Body Bathing: with modified independence;Sit to stand from bed ADL Goal: Lower Body Bathing - Progress: Goal set today Pt Will Perform Upper Body Dressing: with modified independence;Unsupported ADL Goal: Upper Body Dressing - Progress: Goal set today Pt Will Perform Lower Body Dressing: with modified independence;Sit to stand from bed ADL Goal: Lower Body Dressing - Progress: Goal set today Pt Will Transfer to Toilet: with modified independence;with DME;3-in-1 ADL Goal: Toilet Transfer - Progress: Goal set today Miscellaneous OT Goals Miscellaneous OT Goal #1: Pt will verbalize at at least 3 energy conservation strategies for selfcare tasks independently. OT Goal:  Miscellaneous Goal #1 - Progress:  Goal set today  Visit Information  Last OT Received On: 07/18/12 Assistance Needed: +1    Subjective Data  Subjective: I think my toilets are low. Patient Stated Goal: Did not state this session.   Prior Functioning     Home Living Lives With: Alone Available Help at Discharge: Family Type of Home: House Home Layout: One level Bathroom Shower/Tub: Engineer, manufacturing systems: Standard Home Adaptive Equipment: None;Shower chair with back Additional Comments: plans to d/c to daughter's house with intention to eventually return home alone Prior Function Level of Independence: Independent Able to Take Stairs?: Yes Driving: Yes Communication Communication: No difficulties Dominant Hand: Right         Vision/Perception Vision - History Baseline Vision: No visual deficits Patient Visual Report: No change from baseline Vision - Assessment Vision Assessment: Vision not tested Perception Perception: Within Functional Limits Praxis Praxis: Intact   Cognition  Cognition Arousal/Alertness: Awake/alert Behavior During Therapy: WFL for tasks assessed/performed Overall Cognitive Status: Within Functional Limits for tasks assessed    Extremity/Trunk Assessment Right Upper Extremity Assessment RUE ROM/Strength/Tone: Within functional levels RUE Sensation: WFL - Light Touch RUE Coordination: WFL - gross/fine motor Left Upper Extremity Assessment LUE ROM/Strength/Tone: Within functional levels LUE Sensation: WFL - Light Touch LUE Coordination: WFL - gross/fine motor Trunk Assessment Trunk Assessment: Normal     Mobility Transfers Transfers: Sit to Stand Sit to Stand: 4: Min guard;With upper extremity assist;From elevated surface;From bed Stand to Sit: 4: Min guard;With upper extremity assist;To chair/3-in-1        Balance Balance Balance Assessed: Yes Static Standing Balance Static Standing - Balance Support: No upper extremity supported Static Standing -  Level of Assistance: 5: Stand by assistance   End of Session OT - End of Session Activity Tolerance: Patient limited by fatigue Patient left: in chair;with call bell/phone within reach;with nursing in room Nurse Communication: Mobility status     Shellie Rogoff OTR/L Pager number [phone] 07/18/2012, 8:41 AM

## 2012-07-18 NOTE — Progress Notes (Signed)
ANTIBIOTIC CONSULT NOTE - INITIAL  Pharmacy Consult for ampicillin and gentamicin Indication: endocarditis  Allergies  Allergen Reactions  . Codeine Nausea Only    Patient Measurements: Height: 5\' 6"  (167.6 cm) Weight: 184 lb 1.4 oz (83.5 kg) IBW/kg (Calculated) : 59.3 Adjusted Body Weight: 65  Vital Signs: Temp: 98.1 F (36.7 C) (04/25 0803) Temp src: Oral (04/25 0803) BP: 126/70 mmHg (04/25 0803) Pulse Rate: 111 (04/25 0830) Intake/Output from previous day: 04/24 0701 - 04/25 0700 In: 2075 [P.O.:1285; I.V.:340; IV Piggyback:450] Out: 475 [Urine:475] Intake/Output from this shift:    Labs:  Recent Labs  07/16/12 2114 07/17/12 0458 07/18/12 0500  WBC  --   --  6.2  HGB  --   --  8.4*  PLT  --   --  106*  CREATININE 1.19* 1.07 1.17*   Estimated Creatinine Clearance: 46 ml/min (by C-G formula based on Cr of 1.17). No results found for this basename: VANCOTROUGH, Leodis Binet, VANCORANDOM, GENTTROUGH, GENTPEAK, GENTRANDOM, TOBRATROUGH, TOBRAPEAK, TOBRARND, AMIKACINPEAK, AMIKACINTROU, AMIKACIN,  in the last 72 hours   Microbiology: Recent Results (from the past 720 hour(s))  CULTURE, BLOOD (ROUTINE X 2)     Status: None   Collection Time    06/29/12  5:00 PM      Result Value Range Status   Specimen Description BLOOD RIGHT ARM   Final   Special Requests BOTTLES DRAWN AEROBIC AND ANAEROBIC   Final   Culture  Setup Time 06/29/2012 20:35   Final   Culture     Final   Value: ENTEROCOCCUS SPECIES     Note: COMBINATION THERAPY OF HIGH DOSE AMPICILLIN OR VANCOMYCIN, PLUS AN AMINOGLYCOSIDE, IS USUALLY INDICATED FOR SERIOUS ENTEROCOCCAL INFECTIONS.     Note: Gram Stain Report Called to,Read Back By and Verified With: TONI FESTERMAN 06/30/12 1430 BY SMITHERSJ   Report Status 07/03/2012 FINAL   Final   Organism ID, Bacteria ENTEROCOCCUS SPECIES   Final  CULTURE, BLOOD (ROUTINE X 2)     Status: None   Collection Time    06/29/12  5:30 PM      Result Value Range Status   Specimen Description BLOOD RIGHT HAND   Final   Special Requests BOTTLES DRAWN AEROBIC AND ANAEROBIC   Final   Culture  Setup Time 06/29/2012 20:35   Final   Culture     Final   Value: ENTEROCOCCUS SPECIES     Note: SUSCEPTIBILITIES PERFORMED ON PREVIOUS CULTURE WITHIN THE LAST 5 DAYS.     Note: Gram Stain Report Called to,Read Back By and Verified With: TONI FESTERMAN 06/30/12 1540 BY SMITHERSJ   Report Status 07/03/2012 FINAL   Final  URINE CULTURE     Status: None   Collection Time    06/29/12  6:21 PM      Result Value Range Status   Specimen Description URINE, CLEAN CATCH   Final   Special Requests NONE   Final   Culture  Setup Time 06/30/2012 01:46   Final   Colony Count >=100,000 COLONIES/ML   Final   Culture KLEBSIELLA PNEUMONIAE   Final   Report Status 07/01/2012 FINAL   Final   Organism ID, Bacteria KLEBSIELLA PNEUMONIAE   Final  MRSA PCR SCREENING     Status: None   Collection Time    07/12/12 12:54 AM      Result Value Range Status   MRSA by PCR NEGATIVE  NEGATIVE Final   Comment:  The GeneXpert MRSA Assay (FDA     approved for NASAL specimens     only), is one component of a     comprehensive MRSA colonization     surveillance program. It is not     intended to diagnose MRSA     infection nor to guide or     monitor treatment for     MRSA infections.  URINE CULTURE     Status: None   Collection Time    07/12/12  3:45 AM      Result Value Range Status   Specimen Description URINE, CLEAN CATCH   Final   Special Requests NONE   Final   Culture  Setup Time 07/12/2012 11:00   Final   Colony Count 50,000 COLONIES/ML   Final   Culture ENTEROCOCCUS SPECIES   Final   Report Status 07/15/2012 FINAL   Final   Organism ID, Bacteria ENTEROCOCCUS SPECIES   Final  CULTURE, BLOOD (ROUTINE X 2)     Status: None   Collection Time    07/15/12 10:25 AM      Result Value Range Status   Specimen Description BLOOD RIGHT FOREARM   Final   Special Requests BOTTLES  DRAWN AEROBIC AND ANAEROBIC 10CC   Final   Culture  Setup Time 07/15/2012 13:55   Final   Culture     Final   Value: GRAM POSITIVE COCCI IN PAIRS     Note: Gram Stain Report Called to,Read Back By and Verified With: NIKI BEASLEY@1142  ON [id number] BY [facility]   Report Status PENDING   Incomplete  CULTURE, BLOOD (ROUTINE X 2)     Status: None   Collection Time    07/15/12 10:31 AM      Result Value Range Status   Specimen Description BLOOD RIGHT WRIST   Final   Special Requests BOTTLES DRAWN AEROBIC AND ANAEROBIC 10CC   Final   Culture  Setup Time 07/15/2012 13:55   Final   Culture     Final   Value: GRAM POSITIVE COCCI IN PAIRS     Note: Gram Stain Report Called to,Read Back By and Verified With: NIKI BEASLEY@1056  ON [id number] BY [facility]   Report Status PENDING   Incomplete  URINE CULTURE     Status: None   Collection Time    07/15/12  1:09 PM      Result Value Range Status   Specimen Description URINE, RANDOM   Final   Special Requests NONE   Final   Culture  Setup Time 07/15/2012 13:27   Final   Colony Count 30,000 COLONIES/ML   Final   Culture KLEBSIELLA PNEUMONIAE   Final   Report Status 07/17/2012 FINAL   Final   Organism ID, Bacteria KLEBSIELLA PNEUMONIAE   Final  CULTURE, BLOOD (ROUTINE X 2)     Status: None   Collection Time    07/16/12  6:30 PM      Result Value Range Status   Specimen Description BLOOD RIGHT ARM   Final   Special Requests BOTTLES DRAWN AEROBIC AND ANAEROBIC 10CC   Final   Culture  Setup Time 07/17/2012 04:51   Final   Culture     Final   Value:        BLOOD CULTURE RECEIVED NO GROWTH TO DATE CULTURE WILL BE HELD FOR 5 DAYS BEFORE ISSUING A FINAL NEGATIVE REPORT   Report Status PENDING   Incomplete  CULTURE, BLOOD (ROUTINE X 2)     Status:  None   Collection Time    07/16/12  6:40 PM      Result Value Range Status   Specimen Description BLOOD LEFT ARM   Final   Special Requests BOTTLES DRAWN AEROBIC AND ANAEROBIC 10CC   Final   Culture  Setup Time 07/17/2012  04:51   Final   Culture     Final   Value:        BLOOD CULTURE RECEIVED NO GROWTH TO DATE CULTURE WILL BE HELD FOR 5 DAYS BEFORE ISSUING A FINAL NEGATIVE REPORT   Report Status PENDING   Incomplete    Assessment: 72 year old woman with a history 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 Blood cultures earlier this month were positive with enterococcus sensitive to ampicillin, vancomycin and gentamicin.  Blood cultures drawn on 4/22 are positive for GPC in pairs.    Creatinine has been stable - 1.17 today  Goal of Therapy:  Gentamicin peak 3-4 mg/L, trough 1mg /L  Plan:   Continue ampicillin 2g IV q4h  Continue gentamicin 60mg  IV q12.  Will check gentamicin peak/trough around AM dose on 4/26.   Mickeal Skinner 07/18/2012,9:29 AM

## 2012-07-18 NOTE — Progress Notes (Signed)
Subjective:  Patient denies any chest pain shortness of breath. Denies any rash or diarrhea tolerating antibiotics okay. Blood cultures are still pending  Objective:  Vital Signs in the last 24 hours: Temp:  [97.6 F (36.4 C)-98.4 F (36.9 C)] 97.7 F (36.5 C) (04/25 0420) Pulse Rate:  [53-128] 99 (04/25 0420) Resp:  [17-37] 29 (04/25 0420) BP: (95-147)/(57-72) 147/72 mmHg (04/25 0420) SpO2:  [84 %-100 %] 99 % (04/25 0155) Weight:  [83.5 kg (184 lb 1.4 oz)] 83.5 kg (184 lb 1.4 oz) (04/25 0155)  Intake/Output from previous day: 04/24 0701 - 04/25 0700 In: 1750 [P.O.:960; I.V.:340; IV Piggyback:450] Out: 475 [Urine:475] Intake/Output from this shift:    Physical Exam: Neck: no adenopathy, no carotid bruit, no JVD and supple, symmetrical, trachea midline Lungs: clear to auscultation bilaterally Heart: irregularly irregular rhythm, S1, S2 normal and 2 we'll 6 systolic murmur noted Abdomen: soft, non-tender; bowel sounds normal; no masses,  no organomegaly Extremities: extremities normal, atraumatic, no cyanosis or edema  Lab Results:  Recent Labs  07/18/12 0500  WBC 6.2  HGB 8.4*  PLT PENDING    Recent Labs  07/17/12 0458 07/18/12 0500  NA 132* 135  K 4.3 3.7  CL 98 100  CO2 25 25  GLUCOSE 93 97  BUN 18 17  CREATININE 1.07 1.17*   No results found for this basename: TROPONINI, CK, MB,  in the last 72 hours Hepatic Function Panel  Recent Labs  07/16/12 2114  ALBUMIN 2.3*   No results found for this basename: CHOL,  in the last 72 hours No results found for this basename: PROTIME,  in the last 72 hours  Imaging: Imaging results have been reviewed and No results found.  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  LOS: 7 days    Julieana Eshleman N 07/18/2012, 7:42 AM

## 2012-07-19 LAB — CBC
MCHC: 34.5 g/dL (ref 30.0–36.0)
RDW: 14.5 % (ref 11.5–15.5)

## 2012-07-19 LAB — BASIC METABOLIC PANEL
BUN: 20 mg/dL (ref 6–23)
Calcium: 8.7 mg/dL (ref 8.4–10.5)
Creatinine, Ser: 1.41 mg/dL — ABNORMAL HIGH (ref 0.50–1.10)
GFR calc Af Amer: 41 mL/min — ABNORMAL LOW (ref >90)
GFR calc non Af Amer: 36 mL/min — ABNORMAL LOW (ref >90)

## 2012-07-19 LAB — GENTAMICIN LEVEL, TROUGH: Gentamicin Trough: 1.7 ug/mL (ref 0.5–2.0)

## 2012-07-19 LAB — PROTIME-INR: Prothrombin Time: 26.4 seconds — ABNORMAL HIGH (ref 11.6–15.2)

## 2012-07-19 NOTE — Progress Notes (Signed)
Subjective:  Patient denies any chest pain or shortness of breath tolerating antibiotics okay  Objective:  Vital Signs in the last 24 hours: Temp:  [98 F (36.7 C)-98.6 F (37 C)] 98.6 F (37 C) (04/26 0800) Pulse Rate:  [69-97] 96 (04/26 0947) Resp:  [16-32] 22 (04/26 0800) BP: (110-133)/(49-77) 121/55 mmHg (04/26 0946) SpO2:  [95 %-100 %] 99 % (04/26 0800) Weight:  [85 kg (187 lb 6.3 oz)] 85 kg (187 lb 6.3 oz) (04/26 0035)  Intake/Output from previous day: 04/25 0701 - 04/26 0700 In: 2056.8 [P.O.:380; I.V.:1226.8; IV Piggyback:450] Out: 575 [Urine:575] Intake/Output from this shift: Total I/O In: 50 [I.V.:50] Out: -   Physical Exam: Neck: no adenopathy, no carotid bruit, no JVD and supple, symmetrical, trachea midline Lungs: clear to auscultation bilaterally Heart: irregularly irregular rhythm, S1, S2 normal and 2/6 systolic murmur noted no S3 gallop Abdomen: soft, non-tender; bowel sounds normal; no masses,  no organomegaly Extremities: extremities normal, atraumatic, no cyanosis or edema  Lab Results:  Recent Labs  07/18/12 0500 07/19/12 0500  WBC 6.2 5.3  HGB 8.4* 8.2*  PLT 106* 111*    Recent Labs  07/18/12 0500 07/19/12 0500  NA 135 136  K 3.7 3.9  CL 100 101  CO2 25 25  GLUCOSE 97 86  BUN 17 20  CREATININE 1.17* 1.41*   No results found for this basename: TROPONINI, CK, MB,  in the last 72 hours Hepatic Function Panel  Recent Labs  07/16/12 2114  ALBUMIN 2.3*   No results found for this basename: CHOL,  in the last 72 hours No results found for this basename: PROTIME,  in the last 72 hours  Imaging: Imaging results have been reviewed and No results found.  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  LOS: 8 days    Monzerrath Mcburney N 07/19/2012, 10:07 AM

## 2012-07-19 NOTE — Progress Notes (Signed)
[facility]  Date of Admission:  07/11/2012  Antibiotics: Amp/gent day 5  Subjective: Feels ok, no rash, eating some  Objective: Temp:  [98 F (36.7 C)-98.6 F (37 C)] 98.6 F (37 C) (04/26 1136) Pulse Rate:  [69-96] 92 (04/26 1136) Resp:  [16-28] 25 (04/26 1136) BP: (110-133)/(45-77) 112/45 mmHg (04/26 1136) SpO2:  [95 %-100 %] 95 % (04/26 1136) Weight:  [187 lb 6.3 oz (85 kg)] 187 lb 6.3 oz (85 kg) (04/26 0035)  General: Awake in chair, no acute distress Skin: no rash Lungs: CTA B   Lab Results Lab Results  Component Value Date   WBC 5.3 07/19/2012   HGB 8.2* 07/19/2012   HCT 23.8* 07/19/2012   MCV 90.2 07/19/2012   PLT 111* 07/19/2012    Lab Results  Component Value Date   CREATININE 1.41* 07/19/2012   BUN 20 07/19/2012   NA 136 07/19/2012   K 3.9 07/19/2012   CL 101 07/19/2012   CO2 25 07/19/2012    Lab Results  Component Value Date   ALT 9 07/11/2012   AST 25 07/11/2012   ALKPHOS 96 07/11/2012   BILITOT 0.9 07/11/2012      Microbiology: Recent Results (from the past 240 hour(s))  MRSA PCR SCREENING     Status: None   Collection Time    07/12/12 12:54 AM      Result Value Range Status   MRSA by PCR NEGATIVE  NEGATIVE Final   Comment:            The GeneXpert MRSA Assay (FDA     approved for NASAL specimens     only), is one component of a     comprehensive MRSA colonization     surveillance program. It is not     intended to diagnose MRSA     infection nor to guide or     monitor treatment for     MRSA infections.  URINE CULTURE     Status: None   Collection Time    07/12/12  3:45 AM      Result Value Range Status   Specimen Description URINE, CLEAN CATCH   Final   Special Requests NONE   Final   Culture  Setup Time 07/12/2012 11:00   Final   Colony Count 50,000 COLONIES/ML   Final   Culture ENTEROCOCCUS SPECIES   Final   Report Status 07/15/2012 FINAL   Final   Organism ID, Bacteria ENTEROCOCCUS SPECIES   Final    CULTURE, BLOOD (ROUTINE X 2)     Status: None   Collection Time    07/15/12 10:25 AM      Result Value Range Status   Specimen Description BLOOD RIGHT FOREARM   Final   Special Requests BOTTLES DRAWN AEROBIC AND ANAEROBIC 10CC   Final   Culture  Setup Time 07/15/2012 13:55   Final   Culture     Final   Value: ENTEROCOCCUS SPECIES     Note: Gram Stain Report Called to,Read Back By and Verified With: NIKI BEASLEY@1142  ON [id number] BY [facility]   Report Status PENDING   Incomplete  CULTURE, BLOOD (ROUTINE X 2)     Status: None   Collection Time    07/15/12 10:31 AM      Result Value Range Status   Specimen Description BLOOD RIGHT WRIST   Final   Special Requests BOTTLES DRAWN AEROBIC AND ANAEROBIC 10CC   Final   Culture  Setup  Time 07/15/2012 13:55   Final   Culture     Final   Value: ENTEROCOCCUS SPECIES     Note: Gram Stain Report Called to,Read Back By and Verified With: NIKI BEASLEY@1056  ON [id number] BY [facility]   Report Status PENDING   Incomplete  URINE CULTURE     Status: None   Collection Time    07/15/12  1:09 PM      Result Value Range Status   Specimen Description URINE, RANDOM   Final   Special Requests NONE   Final   Culture  Setup Time 07/15/2012 13:27   Final   Colony Count 30,000 COLONIES/ML   Final   Culture KLEBSIELLA PNEUMONIAE   Final   Report Status 07/17/2012 FINAL   Final   Organism ID, Bacteria KLEBSIELLA PNEUMONIAE   Final  CULTURE, BLOOD (ROUTINE X 2)     Status: None   Collection Time    07/16/12  6:30 PM      Result Value Range Status   Specimen Description BLOOD RIGHT ARM   Final   Special Requests BOTTLES DRAWN AEROBIC AND ANAEROBIC 10CC   Final   Culture  Setup Time 07/17/2012 04:51   Final   Culture     Final   Value:        BLOOD CULTURE RECEIVED NO GROWTH TO DATE CULTURE WILL BE HELD FOR 5 DAYS BEFORE ISSUING A FINAL NEGATIVE REPORT   Report Status PENDING   Incomplete  CULTURE, BLOOD (ROUTINE X 2)     Status: None   Collection Time    07/16/12  6:40  PM      Result Value Range Status   Specimen Description BLOOD LEFT ARM   Final   Special Requests BOTTLES DRAWN AEROBIC AND ANAEROBIC 10CC   Final   Culture  Setup Time 07/17/2012 04:51   Final   Culture     Final   Value:        BLOOD CULTURE RECEIVED NO GROWTH TO DATE CULTURE WILL BE HELD FOR 5 DAYS BEFORE ISSUING A FINAL NEGATIVE REPORT   Report Status PENDING   Incomplete    Studies/Results: No results found.  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  Unfortunately, creat has increased again some.  I am concerned with developing renal toxicity.  ACEI stopped.  Will watch again tomorrow, if continues to increase, she may need to be switched to daptomycin.    Staci Righter, MD [facility] [phone] pager   07/19/2012, 1:49 PM

## 2012-07-20 ENCOUNTER — Inpatient Hospital Stay (HOSPITAL_COMMUNITY): Payer: Medicare Other

## 2012-07-20 LAB — CBC
HCT: 24.5 % — ABNORMAL LOW (ref 36.0–46.0)
Hemoglobin: 8.4 g/dL — ABNORMAL LOW (ref 12.0–15.0)
MCHC: 34.3 g/dL (ref 30.0–36.0)

## 2012-07-20 LAB — BASIC METABOLIC PANEL
BUN: 22 mg/dL (ref 6–23)
GFR calc non Af Amer: 34 mL/min — ABNORMAL LOW (ref >90)
Glucose, Bld: 96 mg/dL (ref 70–99)
Potassium: 3.6 mEq/L (ref 3.5–5.1)

## 2012-07-20 LAB — PROTIME-INR: INR: 2.72 — ABNORMAL HIGH (ref 0.00–1.49)

## 2012-07-20 MED ORDER — ONDANSETRON HCL 4 MG/2ML IJ SOLN: 2.0000 mg | Freq: Four times a day (QID) | INTRAMUSCULAR | Status: DC | PRN

## 2012-07-20 MED ORDER — SODIUM CHLORIDE 0.9 % IV SOLN
250.0000 mg | Freq: Four times a day (QID) | INTRAVENOUS | Status: DC
  Administered 2012-07-20 – 2012-07-22 (×9): 250 mg via INTRAVENOUS
  Filled 2012-07-20 (×12): qty 250

## 2012-07-20 MED ORDER — SODIUM CHLORIDE 0.9 % IV SOLN
INTRAVENOUS | Status: DC
  Administered 2012-07-20: 08:00:00 via INTRAVENOUS

## 2012-07-20 MED ORDER — DIGOXIN 125 MCG PO TABS
0.1250 mg | ORAL_TABLET | Freq: Every day | ORAL | Status: DC
  Administered 2012-07-20 – 2012-07-22 (×3): 0.125 mg via ORAL
  Filled 2012-07-20 (×3): qty 1

## 2012-07-20 MED ORDER — FUROSEMIDE 10 MG/ML IJ SOLN
40.0000 mg | Freq: Once | INTRAMUSCULAR | Status: AC
  Administered 2012-07-20: 40 mg via INTRAVENOUS

## 2012-07-20 MED ORDER — LEVALBUTEROL HCL 1.25 MG/0.5ML IN NEBU
1.2500 mg | INHALATION_SOLUTION | Freq: Four times a day (QID) | RESPIRATORY_TRACT | Status: DC | PRN
  Administered 2012-07-20: 1.25 mg via RESPIRATORY_TRACT
  Filled 2012-07-20: qty 0.5

## 2012-07-20 MED ORDER — FUROSEMIDE 10 MG/ML IJ SOLN
40.0000 mg | Freq: Every day | INTRAMUSCULAR | Status: DC
  Administered 2012-07-20 – 2012-07-22 (×3): 40 mg via INTRAVENOUS
  Filled 2012-07-20 (×4): qty 4

## 2012-07-20 MED ORDER — FUROSEMIDE 10 MG/ML IJ SOLN
40.0000 mg | Freq: Once | INTRAMUSCULAR | Status: AC
  Administered 2012-07-20: 40 mg via INTRAVENOUS
  Filled 2012-07-20: qty 4

## 2012-07-20 MED ORDER — DIGOXIN 0.25 MG/ML IJ SOLN
0.2500 mg | Freq: Once | INTRAMUSCULAR | Status: AC
  Administered 2012-07-20: 0.25 mg via INTRAVENOUS
  Filled 2012-07-20: qty 1

## 2012-07-20 MED ORDER — SODIUM CHLORIDE 0.9 % IV SOLN: 250.0000 mL | INTRAVENOUS | Status: DC | PRN

## 2012-07-20 NOTE — Progress Notes (Signed)
Phoned Dr. Algie Coffer to notify provider of elevated HR and RR. Unable to access a provider at present.  Will continue to monitor patient and evaluate treatment plan.

## 2012-07-20 NOTE — Progress Notes (Signed)
Paged [name], MD regarding pt HR increased (100s-110s a-fib), BP 148/82; resps 30s-40s.  Pt denies pain.  Also made MD aware that pt has 78mL/hr of fluids infusing.  Orders received to turn fluids down to [facility], give 40mg  IV lasix once, and portable chest x-ray.  Will continue to monitor.  Salomon Mast, RN

## 2012-07-20 NOTE — Progress Notes (Signed)
[facility]  Date of Admission:  07/11/2012  Antibiotics: Amp/gent day 6  Subjective: Feels ok, no rash, eating some  Objective: Temp:  [97.9 F (36.6 C)-99.2 F (37.3 C)] 98.6 F (37 C) (04/27 0813) Pulse Rate:  [80-133] 96 (04/27 0948) Resp:  [16-48] 42 (04/27 0813) BP: (107-156)/(45-90) 145/84 mmHg (04/27 0948) SpO2:  [89 %-100 %] 92 % (04/27 0813) Weight:  [187 lb 6.3 oz (85 kg)] 187 lb 6.3 oz (85 kg) (04/27 0430)  General: Awake in chair, no acute distress Skin: no rash Lungs: CTA B, no crackles noted Ext: trace pitting edema   Lab Results Lab Results  Component Value Date   WBC 7.8 07/20/2012   HGB 8.4* 07/20/2012   HCT 24.5* 07/20/2012   MCV 89.7 07/20/2012   PLT 126* 07/20/2012    Lab Results  Component Value Date   CREATININE 1.48* 07/20/2012   BUN 22 07/20/2012   NA 135 07/20/2012   K 3.6 07/20/2012   CL 101 07/20/2012   CO2 23 07/20/2012    Lab Results  Component Value Date   ALT 9 07/11/2012   AST 25 07/11/2012   ALKPHOS 96 07/11/2012   BILITOT 0.9 07/11/2012      Microbiology: Recent Results (from the past 240 hour(s))  MRSA PCR SCREENING     Status: None   Collection Time    07/12/12 12:54 AM      Result Value Range Status   MRSA by PCR NEGATIVE  NEGATIVE Final   Comment:            The GeneXpert MRSA Assay (FDA     approved for NASAL specimens     only), is one component of a     comprehensive MRSA colonization     surveillance program. It is not     intended to diagnose MRSA     infection nor to guide or     monitor treatment for     MRSA infections.  URINE CULTURE     Status: None   Collection Time    07/12/12  3:45 AM      Result Value Range Status   Specimen Description URINE, CLEAN CATCH   Final   Special Requests NONE   Final   Culture  Setup Time 07/12/2012 11:00   Final   Colony Count 50,000 COLONIES/ML   Final   Culture ENTEROCOCCUS SPECIES   Final   Report Status 07/15/2012 FINAL   Final   Organism  ID, Bacteria ENTEROCOCCUS SPECIES   Final  CULTURE, BLOOD (ROUTINE X 2)     Status: None   Collection Time    07/15/12 10:25 AM      Result Value Range Status   Specimen Description BLOOD RIGHT FOREARM   Final   Special Requests BOTTLES DRAWN AEROBIC AND ANAEROBIC 10CC   Final   Culture  Setup Time 07/15/2012 13:55   Final   Culture     Final   Value: ENTEROCOCCUS SPECIES     Note: Gram Stain Report Called to,Read Back By and Verified With: NIKI BEASLEY@1142  ON [id number] BY [place]   Report Status PENDING   Incomplete  CULTURE, BLOOD (ROUTINE X 2)     Status: None   Collection Time    07/15/12 10:31 AM      Result Value Range Status   Specimen Description BLOOD RIGHT WRIST   Final   Special Requests BOTTLES DRAWN AEROBIC AND ANAEROBIC 10CC  Final   Culture  Setup Time 07/15/2012 13:55   Final   Culture     Final   Value: ENTEROCOCCUS SPECIES     Note: Gram Stain Report Called to,Read Back By and Verified With: NIKI BEASLEY@1056  ON [id number] BY [facility]   Report Status PENDING   Incomplete  URINE CULTURE     Status: None   Collection Time    07/15/12  1:09 PM      Result Value Range Status   Specimen Description URINE, RANDOM   Final   Special Requests NONE   Final   Culture  Setup Time 07/15/2012 13:27   Final   Colony Count 30,000 COLONIES/ML   Final   Culture KLEBSIELLA PNEUMONIAE   Final   Report Status 07/17/2012 FINAL   Final   Organism ID, Bacteria KLEBSIELLA PNEUMONIAE   Final  CULTURE, BLOOD (ROUTINE X 2)     Status: None   Collection Time    07/16/12  6:30 PM      Result Value Range Status   Specimen Description BLOOD RIGHT ARM   Final   Special Requests BOTTLES DRAWN AEROBIC AND ANAEROBIC 10CC   Final   Culture  Setup Time 07/17/2012 04:51   Final   Culture     Final   Value:        BLOOD CULTURE RECEIVED NO GROWTH TO DATE CULTURE WILL BE HELD FOR 5 DAYS BEFORE ISSUING A FINAL NEGATIVE REPORT   Report Status PENDING   Incomplete  CULTURE, BLOOD (ROUTINE X 2)     Status:  None   Collection Time    07/16/12  6:40 PM      Result Value Range Status   Specimen Description BLOOD LEFT ARM   Final   Special Requests BOTTLES DRAWN AEROBIC AND ANAEROBIC 10CC   Final   Culture  Setup Time 07/17/2012 04:51   Final   Culture     Final   Value:        BLOOD CULTURE RECEIVED NO GROWTH TO DATE CULTURE WILL BE HELD FOR 5 DAYS BEFORE ISSUING A FINAL NEGATIVE REPORT   Report Status PENDING   Incomplete    Studies/Results: Dg Chest Port 1 View  07/20/2012  *RADIOLOGY REPORT*  Clinical Data: Shortness of breath, increased heart rate  PORTABLE CHEST - 1 VIEW  Comparison: 07/11/2012; 06/29/2012; chest CT - 07/11/2012  Findings: Grossly unchanged enlarged cardiac silhouette and mediastinal contours post median sternotomy.  The pulmonary vasculature is indistinct with cephalization of flow.  Interval development of bilateral perihilar heterogeneous opacities, right greater than left.  Query trace bilateral effusions.  No pneumothorax.  Unchanged bones.  IMPRESSION: Findings most suggestive of pulmonary edema and perihilar atelectasis on this AP portable examination.  Further evaluation with a PA and lateral chest radiograph may be obtained as clinically indicated.   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  Unfortunately, creat not improved.  Also a bit volume overloaded and getting lasix.   -I am going to remove the gentamicin.   -continue with ampicillin - I am hesitant to use daptomycin for endocarditis with little good evidence.   -I will add imipenem for synergy (previously rash with ceftrixone synergy) with ampicillin  Staci Righter, MD [facility] [phone] pager   07/20/2012, 10:45 AM

## 2012-07-20 NOTE — Plan of Care (Signed)
Problem: Discharge Progression Outcomes Goal: Tolerating diet Outcome: Not Progressing Pt has been having poor po intake; also, refuses scheduled ensures (states that she doesn't like them).

## 2012-07-20 NOTE — Progress Notes (Signed)
Subjective:  Patient denies any chest pain complains of shortness of breath. Patient had A. fib with RVR went into acute pulmonary edema requiring IV Lasix with improvement in breathing. Creatinines slowly trending up despite slow hydration and off ACE inhibitor. Denies any fever or chills. Denies diarrhea  Objective:  Vital Signs in the last 24 hours: Temp:  [97.9 F (36.6 C)-99.2 F (37.3 C)] 98.6 F (37 C) (04/27 0813) Pulse Rate:  [80-133] 118 (04/27 0813) Resp:  [16-48] 42 (04/27 0813) BP: (107-156)/(45-90) 156/88 mmHg (04/27 0813) SpO2:  [89 %-100 %] 92 % (04/27 0813) Weight:  [85 kg (187 lb 6.3 oz)] 85 kg (187 lb 6.3 oz) (04/27 0430)  Intake/Output from previous day: 04/26 0701 - 04/27 0700 In: 2390 [P.O.:1040; I.V.:1000; IV Piggyback:350] Out: 875 [Urine:875] Intake/Output from this shift: Total I/O In: 47.3 [I.V.:47.3] Out: -   Physical Exam: Neck: no adenopathy, no carotid bruit, no JVD and supple, symmetrical, trachea midline Lungs: Bilateral Rales noted Heart: irregularly irregular rhythm, S1, S2 normal and Removal 6 systolic murmur and S3 gallop noted Abdomen: soft, non-tender; bowel sounds normal; no masses,  no organomegaly Extremities: extremities normal, atraumatic, no cyanosis or edema  Lab Results:  Recent Labs  07/19/12 0500 07/20/12 0550  WBC 5.3 7.8  HGB 8.2* 8.4*  PLT 111* 126*    Recent Labs  07/19/12 0500 07/20/12 0550  NA 136 135  K 3.9 3.6  CL 101 101  CO2 25 23  GLUCOSE 86 96  BUN 20 22  CREATININE 1.41* 1.48*   No results found for this basename: TROPONINI, CK, MB,  in the last 72 hours Hepatic Function Panel No results found for this basename: PROT, ALBUMIN, AST, ALT, ALKPHOS, BILITOT, BILIDIR, IBILI,  in the last 72 hours No results found for this basename: CHOL,  in the last 72 hours No results found for this basename: PROTIME,  in the last 72 hours  Imaging: Imaging results have been reviewed and Dg Chest Port 1  View  07/20/2012  *RADIOLOGY REPORT*  Clinical Data: Shortness of breath, increased heart rate  PORTABLE CHEST - 1 VIEW  Comparison: 07/11/2012; 06/29/2012; chest CT - 07/11/2012  Findings: Grossly unchanged enlarged cardiac silhouette and mediastinal contours post median sternotomy.  The pulmonary vasculature is indistinct with cephalization of flow.  Interval development of bilateral perihilar heterogeneous opacities, right greater than left.  Query trace bilateral effusions.  No pneumothorax.  Unchanged bones.  IMPRESSION: Findings most suggestive of pulmonary edema and perihilar atelectasis on this AP portable examination.  Further evaluation with a PA and lateral chest radiograph may be obtained as clinically indicated.   Original Report Authenticated By: Tacey Ruiz, MD     Cardiac Studies:  Assessment/Plan:  Acute pulmonary edema precipitated by A. 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  Moderate to severe pulmonary hypertension  History of rheumatoid arthritis  Coronary artery disease/history of aortic stenosis  Status post CABG and bioprosthetic aortic valve  Hypertension, essential  Hypercholesteremia  History of pleuropericarditis in remote past  History of GI bleed and also remote past  Acute on Chronic anemia probably because of hydration rule out GI loss  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

## 2012-07-20 NOTE — Progress Notes (Signed)
ANTIBIOTIC CONSULT NOTE - INITIAL  Pharmacy Consult for Primaxin  Indication: Enterococcal Endocarditis Synergy with Ampicllin   Allergies  Allergen Reactions  . Codeine Nausea Only    Patient Measurements: Height: 5\' 6"  (167.6 cm) Weight: 187 lb 6.3 oz (85 kg) IBW/kg (Calculated) : 59.3  Vital Signs: Temp: 98.7 F (37.1 C) (04/27 1110) Temp src: Oral (04/27 1110) BP: 143/71 mmHg (04/27 1110) Pulse Rate: 84 (04/27 1110) Intake/Output from previous day: 04/26 0701 - 04/27 0700 In: 2390 [P.O.:1040; I.V.:1000; IV Piggyback:350] Out: 875 [Urine:875] Intake/Output from this shift: Total I/O In: 47.3 [I.V.:47.3] Out: 950 [Urine:950]  Labs:  Recent Labs  07/18/12 0500 07/19/12 0500 07/20/12 0550  WBC 6.2 5.3 7.8  HGB 8.4* 8.2* 8.4*  PLT 106* 111* 126*  CREATININE 1.17* 1.41* 1.48*   Estimated Creatinine Clearance: 36.6 ml/min (by C-G formula based on Cr of 1.48).  Recent Labs  07/19/12 0949 07/19/12 1115  GENTTROUGH 1.7  --   GENTPEAK  --  1.7*     Microbiology: Recent Results (from the past 720 hour(s))  CULTURE, BLOOD (ROUTINE X 2)     Status: None   Collection Time    06/29/12  5:00 PM      Result Value Range Status   Specimen Description BLOOD RIGHT ARM   Final   Special Requests BOTTLES DRAWN AEROBIC AND ANAEROBIC   Final   Culture  Setup Time 06/29/2012 20:35   Final   Culture     Final   Value: ENTEROCOCCUS SPECIES     Note: COMBINATION THERAPY OF HIGH DOSE AMPICILLIN OR VANCOMYCIN, PLUS AN AMINOGLYCOSIDE, IS USUALLY INDICATED FOR SERIOUS ENTEROCOCCAL INFECTIONS.     Note: Gram Stain Report Called to,Read Back By and Verified With: TONI FESTERMAN 06/30/12 1430 BY [email or web address]   Report Status 07/03/2012 FINAL   Final   Organism ID, Bacteria ENTEROCOCCUS SPECIES   Final  CULTURE, BLOOD (ROUTINE X 2)     Status: None   Collection Time    06/29/12  5:30 PM      Result Value Range Status   Specimen Description BLOOD RIGHT HAND   Final   Special  Requests BOTTLES DRAWN AEROBIC AND ANAEROBIC   Final   Culture  Setup Time 06/29/2012 20:35   Final   Culture     Final   Value: ENTEROCOCCUS SPECIES     Note: SUSCEPTIBILITIES PERFORMED ON PREVIOUS CULTURE WITHIN THE LAST 5 DAYS.     Note: Gram Stain Report Called to,Read Back By and Verified With: TONI FESTERMAN 06/30/12 1540 BY [email or web address]   Report Status 07/03/2012 FINAL   Final  URINE CULTURE     Status: None   Collection Time    06/29/12  6:21 PM      Result Value Range Status   Specimen Description URINE, CLEAN CATCH   Final   Special Requests NONE   Final   Culture  Setup Time 06/30/2012 01:46   Final   Colony Count >=100,000 COLONIES/ML   Final   Culture KLEBSIELLA PNEUMONIAE   Final   Report Status 07/01/2012 FINAL   Final   Organism ID, Bacteria KLEBSIELLA PNEUMONIAE   Final  MRSA PCR SCREENING     Status: None   Collection Time    07/12/12 12:54 AM      Result Value Range Status   MRSA by PCR NEGATIVE  NEGATIVE Final   Comment:            The GeneXpert MRSA Assay (  FDA     approved for NASAL specimens     only), is one component of a     comprehensive MRSA colonization     surveillance program. It is not     intended to diagnose MRSA     infection nor to guide or     monitor treatment for     MRSA infections.  URINE CULTURE     Status: None   Collection Time    07/12/12  3:45 AM      Result Value Range Status   Specimen Description URINE, CLEAN CATCH   Final   Special Requests NONE   Final   Culture  Setup Time 07/12/2012 11:00   Final   Colony Count 50,000 COLONIES/ML   Final   Culture ENTEROCOCCUS SPECIES   Final   Report Status 07/15/2012 FINAL   Final   Organism ID, Bacteria ENTEROCOCCUS SPECIES   Final  CULTURE, BLOOD (ROUTINE X 2)     Status: None   Collection Time    07/15/12 10:25 AM      Result Value Range Status   Specimen Description BLOOD RIGHT FOREARM   Final   Special Requests BOTTLES DRAWN AEROBIC AND ANAEROBIC 10CC   Final   Culture  Setup  Time 07/15/2012 13:55   Final   Culture     Final   Value: ENTEROCOCCUS SPECIES     Note: Gram Stain Report Called to,Read Back By and Verified With: NIKI BEASLEY@1142  ON [id number] BY [facility]   Report Status PENDING   Incomplete  CULTURE, BLOOD (ROUTINE X 2)     Status: None   Collection Time    07/15/12 10:31 AM      Result Value Range Status   Specimen Description BLOOD RIGHT WRIST   Final   Special Requests BOTTLES DRAWN AEROBIC AND ANAEROBIC 10CC   Final   Culture  Setup Time 07/15/2012 13:55   Final   Culture     Final   Value: ENTEROCOCCUS SPECIES     Note: Gram Stain Report Called to,Read Back By and Verified With: NIKI BEASLEY@1056  ON [id number] BY [facility]   Report Status PENDING   Incomplete  URINE CULTURE     Status: None   Collection Time    07/15/12  1:09 PM      Result Value Range Status   Specimen Description URINE, RANDOM   Final   Special Requests NONE   Final   Culture  Setup Time 07/15/2012 13:27   Final   Colony Count 30,000 COLONIES/ML   Final   Culture KLEBSIELLA PNEUMONIAE   Final   Report Status 07/17/2012 FINAL   Final   Organism ID, Bacteria KLEBSIELLA PNEUMONIAE   Final  CULTURE, BLOOD (ROUTINE X 2)     Status: None   Collection Time    07/16/12  6:30 PM      Result Value Range Status   Specimen Description BLOOD RIGHT ARM   Final   Special Requests BOTTLES DRAWN AEROBIC AND ANAEROBIC 10CC   Final   Culture  Setup Time 07/17/2012 04:51   Final   Culture     Final   Value:        BLOOD CULTURE RECEIVED NO GROWTH TO DATE CULTURE WILL BE HELD FOR 5 DAYS BEFORE ISSUING A FINAL NEGATIVE REPORT   Report Status PENDING   Incomplete  CULTURE, BLOOD (ROUTINE X 2)     Status: None   Collection Time    07/16/12  6:40 PM      Result Value Range Status   Specimen Description BLOOD LEFT ARM   Final   Special Requests BOTTLES DRAWN AEROBIC AND ANAEROBIC 10CC   Final   Culture  Setup Time 07/17/2012 04:51   Final   Culture     Final   Value:        BLOOD CULTURE RECEIVED  NO GROWTH TO DATE CULTURE WILL BE HELD FOR 5 DAYS BEFORE ISSUING A FINAL NEGATIVE REPORT   Report Status PENDING   Incomplete    Medical History: Past Medical History  Diagnosis Date  . Coronary artery disease   . Atrial fibrillation     Now NSR  . GERD (gastroesophageal reflux disease)   . Hyperlipidemia   . 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 Anemia   . Angina   . Shortness of breath     with activity  . Hypertension   . Blood transfusion   . Pneumonia   . H/O hiatal hernia   . Lupus     Medications:  Scheduled:  . ampicillin (OMNIPEN) IV  2 g Intravenous Q4H  . atorvastatin  10 mg Oral q1800  . [COMPLETED] digoxin  0.25 mg Intravenous Once  . digoxin  0.125 mg Oral Daily  . diltiazem  30 mg Oral Q12H  . feeding supplement  237 mL Oral TID WC  . [COMPLETED] furosemide  40 mg Intravenous Once  . furosemide  40 mg Intravenous Daily  . hydroxychloroquine  200 mg Oral BID  . imipenem-cilastatin  250 mg Intravenous Q6H  . metoprolol tartrate  12.5 mg Oral BID  . pantoprazole  40 mg Oral Daily  . predniSONE  5 mg Oral Q breakfast  . rOPINIRole  1 mg Oral QHS  . sodium chloride  3 mL Intravenous Q12H  . sulfaSALAzine  1,000 mg Oral BID  . warfarin  4 mg Oral Custom  . Warfarin - Physician Dosing Inpatient   Does not apply q1800  . [DISCONTINUED] gentamicin  60 mg Intravenous Q12H   Assessment: 72 y/o F who was on Amp/Gent for PV enterococcal endocarditis. Given patients labile renal function and variable UOP, she is unable to tolerate the gentamicin regimen and will be switched to Amp/Primaxin per Dr. Luciana Axe. WBC WNL, Tmax 99.2, Scr up today at 1.48<1.41, UOP 0.2-0.84ml/kg/hr. Had an elevated Gentamicin trough level on 4/26 of 1.7.   Goal of Therapy:  Gentamicin trough level <2 mcg/ml Syngery with ampicllin for eradication of infection  Plan:  -DC Gentamicin -Start Primaxin 250 mg IV q6h -Continue ampicillin at current dose -F/U renal  function -Trend clinical progression  Abran Duke, PharmD Clinical Pharmacist Phone: [phone] Pager: [phone] 07/20/2012 11:19 AM

## 2012-07-21 ENCOUNTER — Inpatient Hospital Stay (HOSPITAL_COMMUNITY): Payer: Medicare Other

## 2012-07-21 LAB — CBC
HCT: 22.3 % — ABNORMAL LOW (ref 36.0–46.0)
MCV: 88.8 fL (ref 78.0–100.0)
RBC: 2.51 MIL/uL — ABNORMAL LOW (ref 3.87–5.11)
RDW: 14.5 % (ref 11.5–15.5)
WBC: 5.2 10*3/uL (ref 4.0–10.5)

## 2012-07-21 LAB — CULTURE, BLOOD (ROUTINE X 2)

## 2012-07-21 LAB — BASIC METABOLIC PANEL
BUN: 23 mg/dL (ref 6–23)
CO2: 23 mEq/L (ref 19–32)
Chloride: 102 mEq/L (ref 96–112)
Creatinine, Ser: 1.43 mg/dL — ABNORMAL HIGH (ref 0.50–1.10)
Glucose, Bld: 89 mg/dL (ref 70–99)

## 2012-07-21 MED ORDER — SODIUM CHLORIDE 0.9 % IJ SOLN
10.0000 mL | INTRAMUSCULAR | Status: DC | PRN
  Administered 2012-07-22 (×2): 10 mL

## 2012-07-21 MED ORDER — SODIUM CHLORIDE 0.9 % IJ SOLN
10.0000 mL | Freq: Two times a day (BID) | INTRAMUSCULAR | Status: DC
  Administered 2012-07-21 (×2): 10 mL

## 2012-07-21 MED ORDER — ADULT MULTIVITAMIN W/MINERALS CH
1.0000 | ORAL_TABLET | Freq: Every day | ORAL | Status: DC
  Filled 2012-07-21 (×2): qty 1

## 2012-07-21 MED ORDER — BOOST / RESOURCE BREEZE PO LIQD
1.0000 | Freq: Three times a day (TID) | ORAL | Status: DC
  Administered 2012-07-21 (×2): 1 via ORAL

## 2012-07-21 MED ORDER — POTASSIUM CHLORIDE CRYS ER 20 MEQ PO TBCR
20.0000 meq | EXTENDED_RELEASE_TABLET | Freq: Every day | ORAL | Status: DC
  Administered 2012-07-22: 20 meq via ORAL
  Filled 2012-07-21: qty 1
  Filled 2012-07-21: qty 2
  Filled 2012-07-21: qty 1

## 2012-07-21 MED ORDER — POTASSIUM CHLORIDE CRYS ER 20 MEQ PO TBCR
40.0000 meq | EXTENDED_RELEASE_TABLET | Freq: Once | ORAL | Status: AC
  Administered 2012-07-21: 40 meq via ORAL

## 2012-07-21 NOTE — Progress Notes (Signed)
INITIAL NUTRITION ASSESSMENT  DOCUMENTATION CODES Per approved criteria  -Severe malnutrition in the context of chronic illness   INTERVENTION: 1. Recommend appetite stimulant. 2. Discontinue Ensure Complete - pt is refusing. 3. Add Resource Breeze po TID, each supplement provides 250 kcal and 9 grams of protein. 4. Add MVI 5. If intake does not improve, recommend nutrition support in setting of ongoing poor oral intake. 6. RD to continue to follow nutrition care plan  NUTRITION DIAGNOSIS: Inadequate oral intake related to poor appetite as evidenced by pt and family report.   Goal: Intake to meet >90% of estimated nutrition needs.  Monitor:  weight trends, lab trends, I/O's, PO intake, supplement tolerance  Reason for Assessment: Malnutrition Screening  72 y.o. female  Admitting Dx: SOB and weakness  ASSESSMENT: Admitted with 1 day of SOB and significant weakness. Recently treated for UTI. Work-up revealed recurrent enterococcal bacteremia and recurrent endocarditis.  Experienced low UOP on 2/23, received fluid bolus and lasix d/c'd.  Pt was ordered for scheduled Ensure Complete but she has been refusing them. Poor appetite persists.  Family at bedside confirm 20 lb (17%) wt loss x 6 months. State that pt often has periods of time where she is eating very little, such as now. Pt refusing most meals per family. Family has tried to bring in a few favorite foods however pt still does not eat well.  Pt meets criteria for severe MALNUTRITION in the context of chronic illness as evidenced by intake of <75% x at least 1 month and 17% wt loss x 6 months.   Height: Ht Readings from Last 1 Encounters:  07/11/12 5\' 6"  (1.676 m)    Weight: Wt Readings from Last 1 Encounters:  07/21/12 182 lb 5.1 oz (82.7 kg)    Ideal Body Weight: 130 lb  % Ideal Body Weight: 140%  Wt Readings from Last 10 Encounters:  07/21/12 182 lb 5.1 oz (82.7 kg)  07/21/12 182 lb 5.1 oz (82.7 kg)   06/29/12 160 lb (72.576 kg)  03/08/12 229 lb 4.8 oz (104.01 kg)  02/17/12 228 lb 9.9 oz (103.7 kg)  02/17/12 228 lb 9.9 oz (103.7 kg)  02/17/12 228 lb 9.9 oz (103.7 kg)  02/17/12 228 lb 9.9 oz (103.7 kg)  01/04/12 217 lb (98.431 kg)  12/19/11 219 lb (99.338 kg)    Usual Body Weight: 220 lb  % Usual Body Weight: 83%  BMI:  Body mass index is 29.44 kg/(m^2). Overweight.  Estimated Nutritional Needs: Kcal: 1650 - 1800 kcal Protein:  95 - 110 grams Fluid: 1.8 - 2 liters  Skin: intact  Diet Order: Cardiac  EDUCATION NEEDS: -No education needs identified at this time   Intake/Output Summary (Last 24 hours) at 07/21/12 1322 Last data filed at 07/21/12 1000  Gross per 24 hour  Intake    503 ml  Output   2250 ml  Net  -1747 ml    Last BM: 4/25  Labs:   Recent Labs Lab 07/15/12 0420 07/16/12 2114  07/19/12 0500 07/20/12 0550 07/21/12 0700  NA 131* 132*  < > 136 135 136  K 3.8 4.3  < > 3.9 3.6 3.1*  CL 97 96  < > 101 101 102  CO2 27 25  < > 25 23 23   BUN 22 20  < > 20 22 23   CREATININE 1.11* 1.19*  < > 1.41* 1.48* 1.43*  CALCIUM 8.9 8.8  < > 8.7 8.6 8.5  PHOS  --  3.1  --   --   --   --  GLUCOSE 98 110*  < > 86 96 89  < > = values in this interval not displayed.  CBG (last 3)  No results found for this basename: GLUCAP,  in the last 72 hours  Scheduled Meds: . ampicillin (OMNIPEN) IV  2 g Intravenous Q4H  . atorvastatin  10 mg Oral q1800  . digoxin  0.125 mg Oral Daily  . diltiazem  30 mg Oral Q12H  . feeding supplement  237 mL Oral TID WC  . furosemide  40 mg Intravenous Daily  . hydroxychloroquine  200 mg Oral BID  . imipenem-cilastatin  250 mg Intravenous Q6H  . metoprolol tartrate  12.5 mg Oral BID  . pantoprazole  40 mg Oral Daily  . potassium chloride  20 mEq Oral Daily  . predniSONE  5 mg Oral Q breakfast  . rOPINIRole  1 mg Oral QHS  . sodium chloride  10-40 mL Intracatheter Q12H  . sodium chloride  3 mL Intravenous Q12H  . sulfaSALAzine   1,000 mg Oral BID  . warfarin  4 mg Oral Custom  . Warfarin - Physician Dosing Inpatient   Does not apply q1800    Continuous Infusions: . sodium chloride 10 mL/hr at 07/20/12 0756    Past Medical History  Diagnosis Date  . Coronary artery disease   . Atrial fibrillation     Now NSR  . GERD (gastroesophageal reflux disease)   . Hyperlipidemia   . 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 Anemia   . Angina   . Shortness of breath     with activity  . Hypertension   . Blood transfusion   . Pneumonia   . H/O hiatal hernia   . Lupus     Past Surgical History  Procedure Laterality Date  . Cardiac catheterization    . Cardiac valve replacement      2006  . Coronary artery bypass graft    . Givens capsule study  04/09/2011    Procedure: GIVENS CAPSULE STUDY;  Surgeon: Theda Belfast, MD;  Location: [facility] ENDOSCOPY;  Service: Endoscopy;  Laterality: N/A;  . Tee without cardioversion  02/06/2012    Procedure: TRANSESOPHAGEAL ECHOCARDIOGRAM (TEE);  Surgeon: Ricki Rodriguez, MD;  Location: [facility] ENDOSCOPY;  Service: Cardiovascular;  Laterality: N/A;  . Esophagogastroduodenoscopy  02/12/2012    Procedure: ESOPHAGOGASTRODUODENOSCOPY (EGD);  Surgeon: Theda Belfast, MD;  Location: [facility] ENDOSCOPY;  Service: Endoscopy;  Laterality: N/A;  . Tee without cardioversion N/A 07/15/2012    Procedure: TRANSESOPHAGEAL ECHOCARDIOGRAM (TEE);  Surgeon: Ricki Rodriguez, MD;  Location: [facility] ENDOSCOPY;  Service: Cardiovascular;  Laterality: N/A;    Jarold Motto MS, RD, LDN Pager: [phone] After-hours pager: [phone]

## 2012-07-21 NOTE — Progress Notes (Signed)
Physical Therapy Treatment Patient Details Name: [name] MRN: [id number] DOB: Aug 13, 1940 Today's Date: 07/21/2012 Time: 4098-1191 PT Time Calculation (min): 29 min  PT Assessment / Plan / Recommendation Comments on Treatment Session  Pt remains to have generalized weakness, decreased activity tolerance, and require min guard assist for safe moblity. Pt con't to have increased HR and SOB with all activity. Pt remains to requires 24/7 assist/supervision for safe d/c home.    Follow Up Recommendations  Home health PT;Supervision/Assistance - 24 hour     Does the patient have the potential to tolerate intense rehabilitation     Barriers to Discharge        Equipment Recommendations  Rolling walker with 5" wheels    Recommendations for Other Services    Frequency Min 3X/week   Plan Discharge plan remains appropriate;Frequency remains appropriate    Precautions / Restrictions Precautions Precautions: Fall Precaution Comments: pt receiving blood transfusion Restrictions Weight Bearing Restrictions: No   Pertinent Vitals/Pain Pt denies pain    Mobility  Bed Mobility Bed Mobility: Supine to Sit Supine to Sit: 4: Min assist;HOB elevated Details for Bed Mobility Assistance: pt preferred to pull up on PT despite education to use bed rail Transfers Transfers: Sit to Stand;Stand to Sit Sit to Stand: 4: Min guard;With upper extremity assist;From elevated surface;From bed Stand to Sit: 4: Min guard;With upper extremity assist;To chair/3-in-1 Details for Transfer Assistance: increased time Ambulation/Gait Ambulation/Gait Assistance: 4: Min guard Ambulation Distance (Feet): 75 Feet Assistive device: Rolling walker Ambulation/Gait Assistance Details: + SOB, SpO2 >93%, HR into 130s Gait Pattern: Step-through pattern;Decreased stride length;Decreased hip/knee flexion - left;Decreased hip/knee flexion - right Gait velocity: slow General Gait Details: pt requires rolling walker for  safety, pt limited by SOB and onset of fatigue    Exercises     PT Diagnosis:    PT Problem List:   PT Treatment Interventions:     PT Goals Acute Rehab PT Goals PT Goal: Sit to Stand - Progress: Progressing toward goal PT Goal: Stand to Sit - Progress: Progressing toward goal PT Goal: Stand - Progress: Progressing toward goal PT Goal: Ambulate - Progress: Progressing toward goal  Visit Information  Last PT Received On: 07/21/12 Assistance Needed: +1    Subjective Data  Subjective: Pt received supine in bed agreeable to PT.   Cognition  Cognition Arousal/Alertness: Awake/alert Behavior During Therapy: WFL for tasks assessed/performed Overall Cognitive Status: Within Functional Limits for tasks assessed    Balance     End of Session PT - End of Session Equipment Utilized During Treatment: Gait belt Activity Tolerance: Patient tolerated treatment well Patient left: with nursing in room (sitting EOB) Nurse Communication: Mobility status;Other (comment)   GP     Keddrick Wyne, Becky Sax 07/21/2012, 5:22 PM  Lewis Shock, PT, DPT Pager #: [phone] Office #: [phone]

## 2012-07-21 NOTE — Progress Notes (Addendum)
[facility] ' Day # 7 ampicillin Day # 2 primaxin 5 days gentamicin  Subjective: No new complaints   Antibiotics:  Anti-infectives   Start     Dose/Rate Route Frequency Ordered Stop   07/20/12 1200  imipenem-cilastatin (PRIMAXIN) 250 mg in sodium chloride 0.9 % 100 mL IVPB     250 mg 200 mL/hr over 30 Minutes Intravenous 4 times per day 07/20/12 1113     07/19/12 0200  ampicillin (OMNIPEN) 2 g in sodium chloride 0.9 % 50 mL IVPB     2 g 150 mL/hr over 20 Minutes Intravenous 6 times per day 07/18/12 2204     07/15/12 2200  gentamicin (GARAMYCIN) IVPB 60 mg  Status:  Discontinued     60 mg 100 mL/hr over 30 Minutes Intravenous Every 12 hours 07/15/12 1159 07/19/12 1443   07/15/12 1600  ampicillin (OMNIPEN) 2 g in sodium chloride 0.9 % 50 mL IVPB  Status:  Discontinued     2 g 150 mL/hr over 20 Minutes Intravenous 6 times per day 07/15/12 1340 07/18/12 2204   07/15/12 1200  gentamicin (GARAMYCIN) IVPB 80 mg     80 mg 100 mL/hr over 30 Minutes Intravenous Every 12 hours 07/15/12 1036 07/15/12 1445   07/15/12 1100  Ampicillin-Sulbactam (UNASYN) 3 g in sodium chloride 0.9 % 100 mL IVPB  Status:  Discontinued     3 g 100 mL/hr over 60 Minutes Intravenous Every 6 hours 07/15/12 1036 07/15/12 1338   07/12/12 1000  hydroxychloroquine (PLAQUENIL) tablet 200 mg     200 mg Oral 2 times daily 07/12/12 0040        Medications: Scheduled Meds: . ampicillin (OMNIPEN) IV  2 g Intravenous Q4H  . atorvastatin  10 mg Oral q1800  . digoxin  0.125 mg Oral Daily  . diltiazem  30 mg Oral Q12H  . feeding supplement  1 Container Oral TID BM  . furosemide  40 mg Intravenous Daily  . hydroxychloroquine  200 mg Oral BID  . imipenem-cilastatin  250 mg Intravenous Q6H  . metoprolol tartrate  12.5 mg Oral BID  . multivitamin with minerals  1 tablet Oral Daily  . pantoprazole  40 mg Oral Daily  . potassium chloride  20 mEq Oral Daily  . predniSONE  5 mg Oral Q  breakfast  . rOPINIRole  1 mg Oral QHS  . sodium chloride  10-40 mL Intracatheter Q12H  . sodium chloride  3 mL Intravenous Q12H  . sulfaSALAzine  1,000 mg Oral BID  . warfarin  4 mg Oral Custom  . Warfarin - Physician Dosing Inpatient   Does not apply q1800   Continuous Infusions: . sodium chloride 10 mL/hr at 07/20/12 0756   PRN Meds:.acetaminophen, levalbuterol, ondansetron (ZOFRAN) IV, sodium chloride, sodium chloride   Objective: Weight change: -5 lb 1.1 oz (-2.3 kg)  Intake/Output Summary (Last 24 hours) at 07/21/12 1744 Last data filed at 07/21/12 1600  Gross per 24 hour  Intake    558 ml  Output   2250 ml  Net  -1692 ml   Blood pressure 141/66, pulse 102, temperature 98 F (36.7 C), temperature source Oral, resp. rate 44, height 5\' 6"  (1.676 m), weight 182 lb 5.1 oz (82.7 kg), SpO2 98.00%. Temp:  [97.9 F (36.6 C)-99.5 F (37.5 C)] 98 F (36.7 C) (04/28 1700) Pulse Rate:  [78-107] 102 (04/28 1700) Resp:  [23-44] 44 (04/28 1700) BP: (96-161)/(63-88) 141/66 mmHg (04/28 1645)  SpO2:  [94 %-100 %] 98 % (04/28 1700) Weight:  [182 lb 5.1 oz (82.7 kg)] 182 lb 5.1 oz (82.7 kg) (04/28 0100)  Physical Exam: General: Alert and awake, oriented x3, not in any acute distress.  HEENT: anicteric sclera,, EOMI, oropharynx clear and without exudate  CVS irr, regular rate, normal r, II/VI SEM  Chest: clear to auscultation bilaterally, no wheezing, rales or rhonchi  Abdomen: soft nontender, nondistended, normal bowel sounds,  Extremities: no clubbing or edema noted bilaterally  Skin: no acute rashes, residual healed rash on leg from this fall  Neuro: nonfocal, strength and sensation intact  Lab Results:  Recent Labs  07/20/12 0550 07/21/12 0700  WBC 7.8 5.2  HGB 8.4* 7.7*  HCT 24.5* 22.3*  PLT 126* 116*    BMET  Recent Labs  07/20/12 0550 07/21/12 0700  NA 135 136  K 3.6 3.1*  CL 101 102  CO2 23 23  GLUCOSE 96 89  BUN 22 23  CREATININE 1.48* 1.43*  CALCIUM  8.6 8.5    Micro Results: Recent Results (from the past 240 hour(s))  MRSA PCR SCREENING     Status: None   Collection Time    07/12/12 12:54 AM      Result Value Range Status   MRSA by PCR NEGATIVE  NEGATIVE Final   Comment:            The GeneXpert MRSA Assay (FDA     approved for NASAL specimens     only), is one component of a     comprehensive MRSA colonization     surveillance program. It is not     intended to diagnose MRSA     infection nor to guide or     monitor treatment for     MRSA infections.  URINE CULTURE     Status: None   Collection Time    07/12/12  3:45 AM      Result Value Range Status   Specimen Description URINE, CLEAN CATCH   Final   Special Requests NONE   Final   Culture  Setup Time 07/12/2012 11:00   Final   Colony Count 50,000 COLONIES/ML   Final   Culture ENTEROCOCCUS SPECIES   Final   Report Status 07/15/2012 FINAL   Final   Organism ID, Bacteria ENTEROCOCCUS SPECIES   Final  CULTURE, BLOOD (ROUTINE X 2)     Status: None   Collection Time    07/15/12 10:25 AM      Result Value Range Status   Specimen Description BLOOD RIGHT FOREARM   Final   Special Requests BOTTLES DRAWN AEROBIC AND ANAEROBIC 10CC   Final   Culture  Setup Time 07/15/2012 13:55   Final   Culture     Final   Value: ENTEROCOCCUS SPECIES     Note: COMBINATION THERAPY OF HIGH DOSE AMPICILLIN OR VANCOMYCIN, PLUS AN AMINOGLYCOSIDE, IS USUALLY INDICATED FOR SERIOUS ENTEROCOCCAL INFECTIONS.     Note: Gram Stain Report Called to,Read Back By and Verified With: NIKI BEASLEY@1142  ON [id number] BY [facility]   Report Status 07/21/2012 FINAL   Final   Organism ID, Bacteria ENTEROCOCCUS SPECIES   Final  CULTURE, BLOOD (ROUTINE X 2)     Status: None   Collection Time    07/15/12 10:31 AM      Result Value Range Status   Specimen Description BLOOD RIGHT WRIST   Final   Special Requests BOTTLES DRAWN AEROBIC AND ANAEROBIC 10CC   Final  Culture  Setup Time 07/15/2012 13:55   Final   Culture      Final   Value: ENTEROCOCCUS SPECIES     Note: SUSCEPTIBILITIES PERFORMED ON PREVIOUS CULTURE WITHIN THE LAST 5 DAYS.     Note: Gram Stain Report Called to,Read Back By and Verified With: NIKI BEASLEY@1056  ON [id number] BY [facility]   Report Status 07/21/2012 FINAL   Final  URINE CULTURE     Status: None   Collection Time    07/15/12  1:09 PM      Result Value Range Status   Specimen Description URINE, RANDOM   Final   Special Requests NONE   Final   Culture  Setup Time 07/15/2012 13:27   Final   Colony Count 30,000 COLONIES/ML   Final   Culture KLEBSIELLA PNEUMONIAE   Final   Report Status 07/17/2012 FINAL   Final   Organism ID, Bacteria KLEBSIELLA PNEUMONIAE   Final  CULTURE, BLOOD (ROUTINE X 2)     Status: None   Collection Time    07/16/12  6:30 PM      Result Value Range Status   Specimen Description BLOOD RIGHT ARM   Final   Special Requests BOTTLES DRAWN AEROBIC AND ANAEROBIC 10CC   Final   Culture  Setup Time 07/17/2012 04:51   Final   Culture     Final   Value:        BLOOD CULTURE RECEIVED NO GROWTH TO DATE CULTURE WILL BE HELD FOR 5 DAYS BEFORE ISSUING A FINAL NEGATIVE REPORT   Report Status PENDING   Incomplete  CULTURE, BLOOD (ROUTINE X 2)     Status: None   Collection Time    07/16/12  6:40 PM      Result Value Range Status   Specimen Description BLOOD LEFT ARM   Final   Special Requests BOTTLES DRAWN AEROBIC AND ANAEROBIC 10CC   Final   Culture  Setup Time 07/17/2012 04:51   Final   Culture     Final   Value:        BLOOD CULTURE RECEIVED NO GROWTH TO DATE CULTURE WILL BE HELD FOR 5 DAYS BEFORE ISSUING A FINAL NEGATIVE REPORT   Report Status PENDING   Incomplete    Studies/Results: Dg Chest Port 1 View  07/21/2012  *RADIOLOGY REPORT*  Clinical Data: Post line placement.  PORTABLE CHEST - 1 VIEW  Comparison: 07/20/2012.  Findings: Trachea is midline.  Heart is enlarged, stable.  Right PICC tip projects over the SVC.  There is mild diffuse bilateral air space disease,  possibly slightly improved in the right lower lung zone.  Small bilateral pleural effusions.  IMPRESSION: Congestive heart failure with slight interval improvement.   Original Report Authenticated By: Leanna Battles, M.D.    Dg Chest Port 1 View  07/20/2012  *RADIOLOGY REPORT*  Clinical Data: Shortness of breath, increased heart rate  PORTABLE CHEST - 1 VIEW  Comparison: 07/11/2012; 06/29/2012; chest CT - 07/11/2012  Findings: Grossly unchanged enlarged cardiac silhouette and mediastinal contours post median sternotomy.  The pulmonary vasculature is indistinct with cephalization of flow.  Interval development of bilateral perihilar heterogeneous opacities, right greater than left.  Query trace bilateral effusions.  No pneumothorax.  Unchanged bones.  IMPRESSION: Findings most suggestive of pulmonary edema and perihilar atelectasis on this AP portable examination.  Further evaluation with a PA and lateral chest radiograph may be obtained as clinically indicated.   Original Report Authenticated By: Tacey Ruiz, MD  Assessment/Plan: [name] is a 72 y.o. 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 Dorris Fetch the pt is NOt an operative candidate   --Dr. Luciana Axe changed pt this weekend to amp and imipenem due to concerns with rising creatinine and need pt had for diuresis  --OK to place PICC now  --pt needs 6 weeks of IV antibiotics from date of first negative blood culture = 42314 -->thru June the 3rd  --I will put in consult to case managent to arrange this   # 2  ? History Beta lactam rash? On amp, ceftriaxone before, monitor closely    I will arrange HSFU  For her 1-2 weeks post dc and we will follow her closely as an outpatient  I will contact the family to explain the plan and will then sign off, please call with  further questions.  I spent greater than 45 minutes with the patient including greater than 50% of time in face to face counsel of the patient and in coordination of their care.    LOS: 10 days   [name] 07/21/2012, 5:44 PM

## 2012-07-21 NOTE — Progress Notes (Signed)
Subjective:  Patient denies any chest pain states breathing has improved. Denies any fever chills  Objective:  Vital Signs in the last 24 hours: Temp:  [97.9 F (36.6 C)-99.5 F (37.5 C)] 98.1 F (36.7 C) (04/28 0729) Pulse Rate:  [83-107] 84 (04/28 0945) Resp:  [13-37] 36 (04/28 0729) BP: (129-161)/(61-88) 140/88 mmHg (04/28 0945) SpO2:  [94 %-99 %] 99 % (04/28 0729) Weight:  [82.7 kg (182 lb 5.1 oz)] 82.7 kg (182 lb 5.1 oz) (04/28 0100)  Intake/Output from previous day: 04/27 0701 - 04/28 0700 In: 777.3 [P.O.:260; I.V.:167.3; IV Piggyback:350] Out: 2800 [Urine:2800] Intake/Output from this shift: Total I/O In: 33 [I.V.:33] Out: 500 [Urine:500]  Physical Exam: Neck: no adenopathy, no carotid bruit, no JVD and supple, symmetrical, trachea midline Lungs: Bi basilar crackles noted air entry improved Heart: irregularly irregular rhythm, S1, S2 normal and Soft systolic murmur and S3 gallop noted Abdomen: soft, non-tender; bowel sounds normal; no masses,  no organomegaly Extremities: extremities normal, atraumatic, no cyanosis or edema  Lab Results:  Recent Labs  07/20/12 0550 07/21/12 0700  WBC 7.8 5.2  HGB 8.4* 7.7*  PLT 126* 116*    Recent Labs  07/20/12 0550 07/21/12 0700  NA 135 136  K 3.6 3.1*  CL 101 102  CO2 23 23  GLUCOSE 96 89  BUN 22 23  CREATININE 1.48* 1.43*   No results found for this basename: TROPONINI, CK, MB,  in the last 72 hours Hepatic Function Panel No results found for this basename: PROT, ALBUMIN, AST, ALT, ALKPHOS, BILITOT, BILIDIR, IBILI,  in the last 72 hours No results found for this basename: CHOL,  in the last 72 hours No results found for this basename: PROTIME,  in the last 72 hours  Imaging: Imaging results have been reviewed and Dg Chest Port 1 View  07/20/2012  *RADIOLOGY REPORT*  Clinical Data: Shortness of breath, increased heart rate  PORTABLE CHEST - 1 VIEW  Comparison: 07/11/2012; 06/29/2012; chest CT - 07/11/2012   Findings: Grossly unchanged enlarged cardiac silhouette and mediastinal contours post median sternotomy.  The pulmonary vasculature is indistinct with cephalization of flow.  Interval development of bilateral perihilar heterogeneous opacities, right greater than left.  Query trace bilateral effusions.  No pneumothorax.  Unchanged bones.  IMPRESSION: Findings most suggestive of pulmonary edema and perihilar atelectasis on this AP portable examination.  Further evaluation with a PA and lateral chest radiograph may be obtained as clinically indicated.   Original Report Authenticated By: Tacey Ruiz, MD     Cardiac Studies:  Assessment/Plan:  Resolving pulmonary edema precipitated by A. 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 Check labs in a.m.  LOS: 10 days    Ambriana Selway N 07/21/2012, 10:58 AM

## 2012-07-21 NOTE — Progress Notes (Signed)
Peripherally Inserted Central Catheter/Midline Placement  The IV Nurse has discussed with the patient and/or persons authorized to consent for the patient, the purpose of this procedure and the potential benefits and risks involved with this procedure.  The benefits include less needle sticks, lab draws from the catheter and patient may be discharged home with the catheter.  Risks include, but not limited to, infection, bleeding, blood clot (thrombus formation), and puncture of an artery; nerve damage and irregular heat beat.  Alternatives to this procedure were also discussed.  PICC/Midline Placement Documentation        Mellissa Kohut 07/21/2012, 12:50 PM

## 2012-07-22 ENCOUNTER — Ambulatory Visit: Payer: Medicare Other | Admitting: Critical Care Medicine

## 2012-07-22 LAB — CBC
MCH: 30.9 pg (ref 26.0–34.0)
MCHC: 34.8 g/dL (ref 30.0–36.0)
MCV: 88.7 fL (ref 78.0–100.0)
Platelets: 138 10*3/uL — ABNORMAL LOW (ref 150–400)
RDW: 15 % (ref 11.5–15.5)
WBC: 7.6 10*3/uL (ref 4.0–10.5)

## 2012-07-22 LAB — BASIC METABOLIC PANEL
Calcium: 8.8 mg/dL (ref 8.4–10.5)
Creatinine, Ser: 1.31 mg/dL — ABNORMAL HIGH (ref 0.50–1.10)
GFR calc Af Amer: 45 mL/min — ABNORMAL LOW (ref >90)
GFR calc non Af Amer: 39 mL/min — ABNORMAL LOW (ref >90)

## 2012-07-22 LAB — TYPE AND SCREEN: Unit division: 0

## 2012-07-22 MED ORDER — POTASSIUM CHLORIDE CRYS ER 20 MEQ PO TBCR
20.0000 meq | EXTENDED_RELEASE_TABLET | Freq: Once | ORAL | Status: AC
  Administered 2012-07-22: 20 meq via ORAL

## 2012-07-22 MED ORDER — DIGOXIN 125 MCG PO TABS: 0.1250 mg | ORAL_TABLET | Freq: Every day | ORAL | Status: DC

## 2012-07-22 MED ORDER — SODIUM CHLORIDE 0.9 % IV SOLN: 250.0000 mg | Freq: Four times a day (QID) | INTRAVENOUS | Status: AC

## 2012-07-22 MED ORDER — WARFARIN SODIUM 3 MG PO TABS: 3.0000 mg | ORAL_TABLET | Freq: Every day | ORAL | Status: DC

## 2012-07-22 MED ORDER — BOOST / RESOURCE BREEZE PO LIQD: 1.0000 | Freq: Three times a day (TID) | ORAL | Status: DC

## 2012-07-22 MED ORDER — HEPARIN SOD (PORK) LOCK FLUSH 100 UNIT/ML IV SOLN
250.0000 [IU] | INTRAVENOUS | Status: AC | PRN
  Administered 2012-07-22: 500 [IU]

## 2012-07-22 MED ORDER — SODIUM CHLORIDE 0.9 % IV SOLN: 2.0000 g | INTRAVENOUS | Status: AC

## 2012-07-22 MED ORDER — METOPROLOL TARTRATE 50 MG PO TABS: 25.0000 mg | ORAL_TABLET | Freq: Two times a day (BID) | ORAL | Status: DC

## 2012-07-22 MED ORDER — POTASSIUM CHLORIDE CRYS ER 20 MEQ PO TBCR: 20.0000 meq | EXTENDED_RELEASE_TABLET | Freq: Once | ORAL | Status: DC

## 2012-07-22 NOTE — Progress Notes (Signed)
ANTIBIOTIC CONSULT NOTE - follow up  Pharmacy Consult for Primaxin  Indication: Enterococcal Endocarditis Synergy with Ampicllin   Allergies  Allergen Reactions  . Codeine Nausea Only    Patient Measurements: Height: 5\' 6"  (167.6 cm) Weight: 188 lb 4.4 oz (85.4 kg) IBW/kg (Calculated) : 59.3  Vital Signs: Temp: 98.3 F (36.8 C) (04/29 1145) Temp src: Oral (04/29 1145) BP: 131/63 mmHg (04/29 1145) Pulse Rate: 81 (04/29 1145) Intake/Output from previous day: 04/28 0701 - 04/29 0700 In: 1333 [I.V.:83; Blood:400; IV Piggyback:500] Out: 1650 [Urine:1650] Intake/Output from this shift: Total I/O In: 240 [P.O.:240] Out: 850 [Urine:850]  Labs:  Recent Labs  07/20/12 0550 07/21/12 0700 07/22/12 0500  WBC 7.8 5.2 7.6  HGB 8.4* 7.7* 9.3*  PLT 126* 116* 138*  CREATININE 1.48* 1.43* 1.31*   Estimated Creatinine Clearance: 41.5 ml/min (by C-G formula based on Cr of 1.31). No results found for this basename: VANCOTROUGH, Leodis Binet, VANCORANDOM, GENTTROUGH, GENTPEAK, GENTRANDOM, TOBRATROUGH, TOBRAPEAK, TOBRARND, AMIKACINPEAK, AMIKACINTROU, AMIKACIN,  in the last 72 hours   Microbiology: Recent Results (from the past 720 hour(s))  CULTURE, BLOOD (ROUTINE X 2)     Status: None   Collection Time    06/29/12  5:00 PM      Result Value Range Status   Specimen Description BLOOD RIGHT ARM   Final   Special Requests BOTTLES DRAWN AEROBIC AND ANAEROBIC   Final   Culture  Setup Time 06/29/2012 20:35   Final   Culture     Final   Value: ENTEROCOCCUS SPECIES     Note: COMBINATION THERAPY OF HIGH DOSE AMPICILLIN OR VANCOMYCIN, PLUS AN AMINOGLYCOSIDE, IS USUALLY INDICATED FOR SERIOUS ENTEROCOCCAL INFECTIONS.     Note: Gram Stain Report Called to,Read Back By and Verified With: TONI FESTERMAN 06/30/12 1430 BY [facility]ITHERS[facility]   Report Status 07/03/2012 FINAL   Final   Organism ID, Bacteria ENTEROCOCCUS SPECIES   Final  CULTURE, BLOOD (ROUTINE X 2)     Status: None   Collection Time   06/29/12  5:30 PM      Result Value Range Status   Specimen Description BLOOD RIGHT HAND   Final   Special Requests BOTTLES DRAWN AEROBIC AND ANAEROBIC   Final   Culture  Setup Time 06/29/2012 20:35   Final   Culture     Final   Value: ENTEROCOCCUS SPECIES     Note: SUSCEPTIBILITIES PERFORMED ON PREVIOUS CULTURE WITHIN THE LAST 5 DAYS.     Note: Gram Stain Report Called to,Read Back By and Verified With: TONI FESTERMAN 06/30/12 1540 BY [facility]   Report Status 07/03/2012 FINAL   Final  URINE CULTURE     Status: None   Collection Time    06/29/12  6:21 PM      Result Value Range Status   Specimen Description URINE, CLEAN CATCH   Final   Special Requests NONE   Final   Culture  Setup Time 06/30/2012 01:46   Final   Colony Count >=100,000 COLONIES/ML   Final   Culture KLEBSIELLA PNEUMONIAE   Final   Report Status 07/01/2012 FINAL   Final   Organism ID, Bacteria KLEBSIELLA PNEUMONIAE   Final  MRSA PCR SCREENING     Status: None   Collection Time    07/12/12 12:54 AM      Result Value Range Status   MRSA by PCR NEGATIVE  NEGATIVE Final   Comment:            The GeneXpert MRSA  Assay (FDA     approved for NASAL specimens     only), is one component of a     comprehensive MRSA colonization     surveillance program. It is not     intended to diagnose MRSA     infection nor to guide or     monitor treatment for     MRSA infections.  URINE CULTURE     Status: None   Collection Time    07/12/12  3:45 AM      Result Value Range Status   Specimen Description URINE, CLEAN CATCH   Final   Special Requests NONE   Final   Culture  Setup Time 07/12/2012 11:00   Final   Colony Count 50,000 COLONIES/ML   Final   Culture ENTEROCOCCUS SPECIES   Final   Report Status 07/15/2012 FINAL   Final   Organism ID, Bacteria ENTEROCOCCUS SPECIES   Final  CULTURE, BLOOD (ROUTINE X 2)     Status: None   Collection Time    07/15/12 10:25 AM      Result Value Range Status   Specimen Description  BLOOD RIGHT FOREARM   Final   Special Requests BOTTLES DRAWN AEROBIC AND ANAEROBIC 10CC   Final   Culture  Setup Time 07/15/2012 13:55   Final   Culture     Final   Value: ENTEROCOCCUS SPECIES     Note: COMBINATION THERAPY OF HIGH DOSE AMPICILLIN OR VANCOMYCIN, PLUS AN AMINOGLYCOSIDE, IS USUALLY INDICATED FOR SERIOUS ENTEROCOCCAL INFECTIONS.     Note: Gram Stain Report Called to,Read Back By and Verified With: NIKI BEASLEY@1142  ON [id number] BY [facility]   Report Status 07/21/2012 FINAL   Final   Organism ID, Bacteria ENTEROCOCCUS SPECIES   Final  CULTURE, BLOOD (ROUTINE X 2)     Status: None   Collection Time    07/15/12 10:31 AM      Result Value Range Status   Specimen Description BLOOD RIGHT WRIST   Final   Special Requests BOTTLES DRAWN AEROBIC AND ANAEROBIC 10CC   Final   Culture  Setup Time 07/15/2012 13:55   Final   Culture     Final   Value: ENTEROCOCCUS SPECIES     Note: SUSCEPTIBILITIES PERFORMED ON PREVIOUS CULTURE WITHIN THE LAST 5 DAYS.     Note: Gram Stain Report Called to,Read Back By and Verified With: NIKI BEASLEY@1056  ON [id number] BY [facility]   Report Status 07/21/2012 FINAL   Final  URINE CULTURE     Status: None   Collection Time    07/15/12  1:09 PM      Result Value Range Status   Specimen Description URINE, RANDOM   Final   Special Requests NONE   Final   Culture  Setup Time 07/15/2012 13:27   Final   Colony Count 30,000 COLONIES/ML   Final   Culture KLEBSIELLA PNEUMONIAE   Final   Report Status 07/17/2012 FINAL   Final   Organism ID, Bacteria KLEBSIELLA PNEUMONIAE   Final  CULTURE, BLOOD (ROUTINE X 2)     Status: None   Collection Time    07/16/12  6:30 PM      Result Value Range Status   Specimen Description BLOOD RIGHT ARM   Final   Special Requests BOTTLES DRAWN AEROBIC AND ANAEROBIC 10CC   Final   Culture  Setup Time 07/17/2012 04:51   Final   Culture     Final   Value:  BLOOD CULTURE RECEIVED NO GROWTH TO DATE CULTURE WILL BE HELD FOR 5 DAYS BEFORE  ISSUING A FINAL NEGATIVE REPORT   Report Status PENDING   Incomplete  CULTURE, BLOOD (ROUTINE X 2)     Status: None   Collection Time    07/16/12  6:40 PM      Result Value Range Status   Specimen Description BLOOD LEFT ARM   Final   Special Requests BOTTLES DRAWN AEROBIC AND ANAEROBIC 10CC   Final   Culture  Setup Time 07/17/2012 04:51   Final   Culture     Final   Value:        BLOOD CULTURE RECEIVED NO GROWTH TO DATE CULTURE WILL BE HELD FOR 5 DAYS BEFORE ISSUING A FINAL NEGATIVE REPORT   Report Status PENDING   Incomplete   Assessment: 72 y/o F who was on Amp/Gent for PV enterococcal endocarditis. Given patients labile renal function and variable UOP, she is unable to tolerate the gentamicin regimen and will be switched to Amp/Primaxin per Dr. Luciana Axe on 4/27. WBC WNL, Tmax 98.3, Scr down today at 1.31.  UOP 0.8 mL/kg/hr yesterday.   Goal of Therapy:  Ampicillin and Imipenem synergy for endocarditis treatment  Plan:  -Increase Primaxin to 500mg  IV q8h no that CrCl > 40 mL/min -Continue ampicillin at current dose -F/U renal function  Celedonio Miyamoto, PharmD, [facility] Clinical Pharmacist Pager [phone]   07/22/2012 12:59 PM

## 2012-07-22 NOTE — Progress Notes (Signed)
   CARE MANAGEMENT NOTE 07/22/2012  Patient:  [name],[name]   Account Number:  [id number]  Date Initiated:  07/12/2012  Documentation initiated by:  Vance Peper  Subjective/Objective Assessment:   72 yr old female admitted with shortnes of breath, weakness.     Action/Plan:   Cm spoke with patient's daughter-[name]-[phone]. Discussed need for [facility] for disease management. Choice offered.They have used[facility] in the past. CM will fax order. Patient has rolling walker and 3in1.    DC Planning Services  CM consult      [facility] Choice  HOME HEALTH   Choice offered to / List presented to:  C-4 Adult Children   DME arranged  IV PUMP/EQUIPMENT        HH arranged  HH-1 RN  HH-2 PT  HH-3 OT      [facility] agency  [facility]   Status of service:  Completed, signed off  Discharge Disposition:  HOME W HOME HEALTH SERVICES  Per UR Regulation:  Reviewed for med. necessity/level of care/duration of stay  If discussed at Long Length of Stay Meetings, dates discussed:   07/17/2012  07/22/2012    Comments:  07/22/12 1205 Loda Bialas RN BSN MSN CCM Per [facility] liaison, arrangements have been made for home IV antibx and lab draws per ID.  Pt stated home O2 was ordered for her by her pulmonologist but she has not been using same.  RA sats @ rest 86% - pt encouraged to use home O2 . PT/OT recommend home therapy and pt agrees, states she has rolling walker @ home. 1250 Talked with dtr, [name] [phone]), re home care services and O2 - she reports that pt's O2 tanks are empty and pt will need tank supplied by [facility] for d/c - CM will notify [facility] liaison.  07/21/12 1509 Shalinda Burkholder RN BSN MSN CCM Pt will be discharged on antibx when medically stable.  [facility] liaison following.  07/15/12 1242 Jorryn Casagrande RN BSN MSN CCM PT recommends home therapy.  Discussed with pt

## 2012-07-22 NOTE — Progress Notes (Signed)
Occupational Therapy Treatment Patient Details Name: [name] MRN: [id number] DOB: December 28, 1940 Today's Date: 07/22/2012 Time: 3244-0102 OT Time Calculation (min): 17 min  OT Assessment / Plan / Recommendation Comments on Treatment Session  Pt with plan to discharge home today with 24 hour assist.  Pt. Fatigues rapidly with minimal activity.  Recommend continued OT at home.     Follow Up Recommendations  Home health OT;Supervision/Assistance - 24 hour    Barriers to Discharge       Equipment Recommendations  3 in 1 bedside comode    Recommendations for Other Services    Frequency Min 2X/week   Plan Discharge plan remains appropriate    Precautions / Restrictions Precautions Precautions: Fall Restrictions Weight Bearing Restrictions: No   Pertinent Vitals/Pain O2 sats 93-96% on 2L; however, DOE 3-4/4 with grooming standing at sink    ADL  Grooming: Teeth care;Min guard Where Assessed - Grooming: Unsupported standing Transfers/Ambulation Related to ADLs: min guard assist ADL Comments: Pt with DOE 7-2/5 with brushing teeth and was unable to participate further.  She was sitting EOB on therapist's arrival.  She is planning to discharge home today, and is very anxious to do so.  She reports she will have adequate assist at home.  Instructed her in energy conservation techniques    OT Diagnosis:    OT Problem List:   OT Treatment Interventions:     OT Goals Acute Rehab OT Goals Time For Goal Achievement: 08/01/12 ADL Goals ADL Goal: Grooming - Progress: Progressing toward goals Miscellaneous OT Goals OT Goal: Miscellaneous Goal #1 - Progress: Progressing toward goals  Visit Information  Last OT Received On: 07/22/12    Subjective Data      Prior Functioning       Cognition  Cognition Arousal/Alertness: Awake/alert Behavior During Therapy: WFL for tasks assessed/performed Overall Cognitive Status: Within Functional Limits for tasks assessed    Mobility  Transfers Transfers: Sit to Stand Sit to Stand: 4: Min guard;With upper extremity assist;From bed Stand to Sit: 4: Min guard;With upper extremity assist;To bed    Exercises      Balance Balance Balance Assessed: Yes Dynamic Standing Balance Dynamic Standing - Balance Support: No upper extremity supported Dynamic Standing - Level of Assistance: 5: Stand by assistance Dynamic Standing - Balance Activities: Other (comment) (grooming )   End of Session OT - End of Session Activity Tolerance: Patient limited by fatigue Patient left: in bed;with call bell/phone within reach (EOB) Nurse Communication: Mobility status  GO     Dondre Catalfamo, Ursula Alert M 07/22/2012, 1:39 PM

## 2012-07-22 NOTE — Progress Notes (Signed)
[facility]  Day # 8ampicillin Day # 3 primaxin 5 days gentamicin  Subjective: No new complaints   Antibiotics:  Anti-infectives   Start     Dose/Rate Route Frequency Ordered Stop   07/22/12 0000  sodium chloride 0.9 % SOLN 50 mL with ampicillin 2 G SOLR 2 g     2 g 150 mL/hr over 20 Minutes Intravenous Every 4 hours 07/22/12 1117 08/26/12 2359   07/22/12 0000  sodium chloride 0.9 % SOLN 100 mL with imipenem-cilastatin 250 MG SOLR 250 mg     250 mg 200 mL/hr over 30 Minutes Intravenous Every 6 hours 07/22/12 1117 08/26/12 2359   07/20/12 1200  imipenem-cilastatin (PRIMAXIN) 250 mg in sodium chloride 0.9 % 100 mL IVPB     250 mg 200 mL/hr over 30 Minutes Intravenous 4 times per day 07/20/12 1113     07/19/12 0200  ampicillin (OMNIPEN) 2 g in sodium chloride 0.9 % 50 mL IVPB     2 g 150 mL/hr over 20 Minutes Intravenous 6 times per day 07/18/12 2204     07/15/12 2200  gentamicin (GARAMYCIN) IVPB 60 mg  Status:  Discontinued     60 mg 100 mL/hr over 30 Minutes Intravenous Every 12 hours 07/15/12 1159 07/19/12 1443   07/15/12 1600  ampicillin (OMNIPEN) 2 g in sodium chloride 0.9 % 50 mL IVPB  Status:  Discontinued     2 g 150 mL/hr over 20 Minutes Intravenous 6 times per day 07/15/12 1340 07/18/12 2204   07/15/12 1200  gentamicin (GARAMYCIN) IVPB 80 mg     80 mg 100 mL/hr over 30 Minutes Intravenous Every 12 hours 07/15/12 1036 07/15/12 1445   07/15/12 1100  Ampicillin-Sulbactam (UNASYN) 3 g in sodium chloride 0.9 % 100 mL IVPB  Status:  Discontinued     3 g 100 mL/hr over 60 Minutes Intravenous Every 6 hours 07/15/12 1036 07/15/12 1338   07/12/12 1000  hydroxychloroquine (PLAQUENIL) tablet 200 mg     200 mg Oral 2 times daily 07/12/12 0040        Medications: Scheduled Meds: . ampicillin (OMNIPEN) IV  2 g Intravenous Q4H  . atorvastatin  10 mg Oral q1800  . digoxin  0.125 mg Oral Daily  . diltiazem  30 mg Oral Q12H  . feeding supplement   1 Container Oral TID BM  . furosemide  40 mg Intravenous Daily  . hydroxychloroquine  200 mg Oral BID  . imipenem-cilastatin  250 mg Intravenous Q6H  . metoprolol tartrate  12.5 mg Oral BID  . multivitamin with minerals  1 tablet Oral Daily  . pantoprazole  40 mg Oral Daily  . potassium chloride  20 mEq Oral Daily  . predniSONE  5 mg Oral Q breakfast  . rOPINIRole  1 mg Oral QHS  . sodium chloride  10-40 mL Intracatheter Q12H  . sodium chloride  3 mL Intravenous Q12H  . sulfaSALAzine  1,000 mg Oral BID  . warfarin  4 mg Oral Custom  . Warfarin - Physician Dosing Inpatient   Does not apply q1800   Continuous Infusions: . sodium chloride 10 mL/hr at 07/20/12 0756   PRN Meds:.acetaminophen, levalbuterol, ondansetron (ZOFRAN) IV, sodium chloride, sodium chloride   Objective: Weight change: 5 lb 15.2 oz (2.7 kg)  Intake/Output Summary (Last 24 hours) at 07/22/12 1226 Last data filed at 07/22/12 1130  Gross per 24 hour  Intake   1420 ml  Output  2000 ml  Net   -580 ml   Blood pressure 131/63, pulse 81, temperature 98.3 F (36.8 C), temperature source Oral, resp. rate 30, height 5\' 6"  (1.676 m), weight 188 lb 4.4 oz (85.4 kg), SpO2 95.00%. Temp:  [97.9 F (36.6 C)-98.7 F (37.1 C)] 98.3 F (36.8 C) (04/29 1145) Pulse Rate:  [71-108] 81 (04/29 1145) Resp:  [20-44] 30 (04/29 1145) BP: (96-160)/(63-88) 131/63 mmHg (04/29 1145) SpO2:  [85 %-100 %] 95 % (04/29 1145) Weight:  [188 lb 4.4 oz (85.4 kg)] 188 lb 4.4 oz (85.4 kg) (04/29 0338)  Physical Exam: General: Alert and awake, oriented x3, not in any acute distress.  HEENT: anicteric sclera,, EOMI, oropharynx clear and without exudate  CVS irr, regular rate, normal r, II/VI SEM  Chest: clear to auscultation bilaterally, no wheezing, rales or rhonchi  Abdomen: soft nontender, nondistended, normal bowel sounds,  Extremities: no clubbing or edema noted bilaterally  Skin: no acute rashes, residual healed rash on leg from this  fall  Neuro: nonfocal, strength and sensation intact  Lab Results:  Recent Labs  07/21/12 0700 07/22/12 0500  WBC 5.2 7.6  HGB 7.7* 9.3*  HCT 22.3* 26.7*  PLT 116* 138*    BMET  Recent Labs  07/21/12 0700 07/22/12 0500  NA 136 136  K 3.1* 3.4*  CL 102 101  CO2 23 24  GLUCOSE 89 95  BUN 23 21  CREATININE 1.43* 1.31*  CALCIUM 8.5 8.8    Micro Results: Recent Results (from the past 240 hour(s))  CULTURE, BLOOD (ROUTINE X 2)     Status: None   Collection Time    07/15/12 10:25 AM      Result Value Range Status   Specimen Description BLOOD RIGHT FOREARM   Final   Special Requests BOTTLES DRAWN AEROBIC AND ANAEROBIC 10CC   Final   Culture  Setup Time 07/15/2012 13:55   Final   Culture     Final   Value: ENTEROCOCCUS SPECIES     Note: COMBINATION THERAPY OF HIGH DOSE AMPICILLIN OR VANCOMYCIN, PLUS AN AMINOGLYCOSIDE, IS USUALLY INDICATED FOR SERIOUS ENTEROCOCCAL INFECTIONS.     Note: Gram Stain Report Called to,Read Back By and Verified With: NIKI BEASLEY@1142  ON [id number] BY [facility]   Report Status 07/21/2012 FINAL   Final   Organism ID, Bacteria ENTEROCOCCUS SPECIES   Final  CULTURE, BLOOD (ROUTINE X 2)     Status: None   Collection Time    07/15/12 10:31 AM      Result Value Range Status   Specimen Description BLOOD RIGHT WRIST   Final   Special Requests BOTTLES DRAWN AEROBIC AND ANAEROBIC 10CC   Final   Culture  Setup Time 07/15/2012 13:55   Final   Culture     Final   Value: ENTEROCOCCUS SPECIES     Note: SUSCEPTIBILITIES PERFORMED ON PREVIOUS CULTURE WITHIN THE LAST 5 DAYS.     Note: Gram Stain Report Called to,Read Back By and Verified With: NIKI BEASLEY@1056  ON [id number] BY [facility]   Report Status 07/21/2012 FINAL   Final  URINE CULTURE     Status: None   Collection Time    07/15/12  1:09 PM      Result Value Range Status   Specimen Description URINE, RANDOM   Final   Special Requests NONE   Final   Culture  Setup Time 07/15/2012 13:27   Final   Colony Count  30,000 COLONIES/ML   Final   Culture KLEBSIELLA PNEUMONIAE  Final   Report Status 07/17/2012 FINAL   Final   Organism ID, Bacteria KLEBSIELLA PNEUMONIAE   Final  CULTURE, BLOOD (ROUTINE X 2)     Status: None   Collection Time    07/16/12  6:30 PM      Result Value Range Status   Specimen Description BLOOD RIGHT ARM   Final   Special Requests BOTTLES DRAWN AEROBIC AND ANAEROBIC 10CC   Final   Culture  Setup Time 07/17/2012 04:51   Final   Culture     Final   Value:        BLOOD CULTURE RECEIVED NO GROWTH TO DATE CULTURE WILL BE HELD FOR 5 DAYS BEFORE ISSUING A FINAL NEGATIVE REPORT   Report Status PENDING   Incomplete  CULTURE, BLOOD (ROUTINE X 2)     Status: None   Collection Time    07/16/12  6:40 PM      Result Value Range Status   Specimen Description BLOOD LEFT ARM   Final   Special Requests BOTTLES DRAWN AEROBIC AND ANAEROBIC 10CC   Final   Culture  Setup Time 07/17/2012 04:51   Final   Culture     Final   Value:        BLOOD CULTURE RECEIVED NO GROWTH TO DATE CULTURE WILL BE HELD FOR 5 DAYS BEFORE ISSUING A FINAL NEGATIVE REPORT   Report Status PENDING   Incomplete    Studies/Results: Dg Chest Port 1 View  07/21/2012  *RADIOLOGY REPORT*  Clinical Data: Post line placement.  PORTABLE CHEST - 1 VIEW  Comparison: 07/20/2012.  Findings: Trachea is midline.  Heart is enlarged, stable.  Right PICC tip projects over the SVC.  There is mild diffuse bilateral air space disease, possibly slightly improved in the right lower lung zone.  Small bilateral pleural effusions.  IMPRESSION: Congestive heart failure with slight interval improvement.   Original Report Authenticated By: Leanna Battles, M.D.       Assessment/Plan: MALEEKA SABATINO is a 72 y.o. 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 Dorris Fetch the pt is NOt an  operative candidate   Dr. Luciana Axe changed pt this weekend to amp and imipenem due to concerns with rising creatinine and need pt had for diuresis  PICC placed   --pt needs 6 weeks of IV antibiotics from date of first negative blood culture = 42314 -->thru June the 3rd   --I  put in consult to case managent to arrange this  PHARMACY COULD/SHOULD HER IMIPENEM DOSE BE INCREASED TO 500MG  IV Q 6 HOURS?     I WENT OVER PLAN AT LENGTH WITH THE PT AND HER DAUGHTER ROBIN OVER THE PHONE  I spent greater than 45 minutes with the patient including greater than 50% of time in face to face counsel of the patient and in coordination of their care.  I will arrange HSFU  For her 1-2 weeks post dc and we will follow her closely as an outpatient, I will otherwise sign off for now     LOS: 11 days   [name] 07/22/2012, 12:26 PM

## 2012-07-22 NOTE — Progress Notes (Signed)
SATURATION QUALIFICATIONS: (This note is used to comply with regulatory documentation for home oxygen)  Patient Saturations on Room Air at Rest = 86%  Patient Saturations on Room Air while Ambulating = NA%  Patient Saturations on NA  Liters of oxygen while Ambulating = NA%  Please briefly explain why patient needs home oxygen: SAT 86% at rest sitting on edge of bed, pt panting. O2 replaced at 2L, sat back up to 95-97%.

## 2012-07-22 NOTE — Plan of Care (Signed)
Problem: Discharge Progression Outcomes Goal: O2 sats > or equal 90% or at baseline Outcome: Completed/Met Date Met:  07/22/12 With O2 2L

## 2012-07-22 NOTE — Progress Notes (Signed)
D/c instructions reviewed with pt and daughter. Pt going home on IV antibiotics for continued treatment of endocarditis. HH agency/HHRN to meet pt and daughter this evening to educate on IV antibiotics thru PICC line (pt and daugher has done this before). Pt given copy of instructions and scripts, pt will d/c home with belongings with O2 on via wheelchair with daughter, escorted by unit NT. IV team here and capping of PICC for home use.

## 2012-07-23 ENCOUNTER — Telehealth: Payer: Self-pay | Admitting: *Deleted

## 2012-07-23 ENCOUNTER — Telehealth: Payer: Self-pay | Admitting: Infectious Disease

## 2012-07-23 LAB — CULTURE, BLOOD (ROUTINE X 2)

## 2012-07-23 MED ORDER — ONDANSETRON HCL 4 MG PO TABS: 4.0000 mg | ORAL_TABLET | Freq: Four times a day (QID) | ORAL | Status: DC | PRN

## 2012-07-23 NOTE — Telephone Encounter (Signed)
Pt daughter called co of nausea and episode of emesis.   I am sedning zofran in e scribe  If pt has worsening ssx will work in to [place] [employer] sooner

## 2012-07-23 NOTE — Discharge Summary (Signed)
[name], [name]               ACCOUNT NO.:  [id number]  MEDICAL RECORD NO.:  [id number]  LOCATION:  2615                         FACILITY:  [facility]  PHYSICIAN:  Eduardo Osier. Sharyn Lull, M.D. DATE OF BIRTH:  03-20-1941  DATE OF ADMISSION:  07/11/2012 DATE OF DISCHARGE:  07/22/2012                              DISCHARGE SUMMARY   ADMITTING DIAGNOSES: 1. Acute left heart systolic failure. 2. Chronic atrial fibrillation. 3. 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 4. History of rheumatoid arthritis. 5. coronary artery disease status post aortic stenosis status post     coronary artery bypass grafting and bioprosthetic aortic valve. 6. Hypertension. 7. Hypercholesteremia. 8. History of pleural pericarditis in the remote past. 9. History of gastrointestinal bleed also in the remote past. 10.Status post acute renal insufficiency. 11.Chronic anemia.  DISCHARGE DIAGNOSES: 1. 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 2. Decompensated heart failure secondary to preserved systolic     function. 3. Chronic atrial fibrillation. 4. History of recurrent urinary tract infection. 5. 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 6. Moderate-to-severe pulmonary hypertension. 7. History of rheumatoid arthritis. 8. Coronary artery disease, history of aortic stenosis, status post     coronary artery bypass grafting and bioprosthetic aortic valve. 9. Hypertension. 10.Hypercholesteremia. 11.History of pleural pericarditis in the remote past. 12.History of gastrointestinal bleed in the remote past. 13.Acute on chronic anemia secondary to hydration. 14.Acute on chronic renal insufficiency stage II, improved.  DISCHARGE HOME MEDICATIONS: 1. Digoxin 0.125 mg 1 tablet daily. 2. Feeding supplement 1 container 3 times daily as tolerated. 3. Ampicillin  antibiotic 2 g every 4 hours until June 3rd. 4. Imipenem 250 mg every 6 hours until June 3rd. 5. Warfarin 3 mg 1 tablet daily. 6. Metoprolol 25 mg 1 tablet twice daily. 7. Plaquenil 200 mg twice daily as before. 8. Zaroxolyn 2.5 mg 1 tablet daily. 9. Protonix 40 mg 1 tablet daily. 10.Potassium chloride 40 mEq twice daily. 11.Prednisone 5 mg daily. 12.Requip 1 mg daily at bedtime. 13.Crestor 10 mg 1 tablet daily. 14.Sulfasalazine 500 mg 2 tablets twice daily.  DIET:  Low salt, low cholesterol.  ACTIVITY:  Increase activity slowly as tolerated.  CONDITION AT DISCHARGE:  Stable.  Home O2 2 L as before via nasal cannula.  BRIEF HISTORY AND HOSPITAL COURSE:  Ms. 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 Algie Coffer on April 18th because of 1-day history of shortness of breath with significant generalized weakness.  Recently, the patient was treated for UTI.  PAST MEDICAL HISTORY:  As above.  PAST SURGICAL HISTORY:  She had aortic valve replacement in 2006.  She  had bioprosthetic aortic valve, had upper endoscopy in November 2013, also had capsule study in the past.  PHYSICAL EXAMINATION:  VITAL SIGNS:  Her blood pressure was 103/64, pulse was 91 regularly irregular. GENERAL:  She was afebrile. HEENT:  Conjunctivae was pink. NECK:  Supple.  No JVD. LUNGS:  Clear to auscultation  without rhonchi or rales. CARDIOVASCULAR:  Regularly irregular.  S1 and S2 was soft.  There was soft systolic murmur.  No S3 gallop. ABDOMEN:  Soft.  Bowel sounds present, nontender. EXTREMITIES:  There is no clubbing, cyanosis, 1+ edema. NEURO:  She was alert, oriented to person, place, and time.  LABORATORY DATA:  Sodium was 128, potassium 3.5, BUN 20, creatinine 1.06.  Her BNP was 12,830.  Hemoglobin was 9.6, hematocrit 37.8, white count of 10.0.  The patient had blood cultures drawn on April 6th in the ER, which grew enterococcus.  The patient also had urine culture drawn, which showed Klebsiella pneumonia.  Repeat blood cultures have been negative so far, which were drawn on July 16, 2012.  Her last electrolytes; sodium 136, potassium 3.4, BUN 21, creatinine is trending down to 1.31.  Hemoglobin is 9.3, hematocrit 26.7, white count of 7.6. Her PT is 29.3, INR of 2.96.  BRIEF HOSPITAL COURSE:  The patient was admitted to telemetry unit.  MI was ruled out by serial enzymes and EKG.  The patient subsequently had 2D echo and TEE, which suggested new evidence of vegetations in the bioprosthetic aortic valve and also in the mitral valve.  Blood cultures were drawn, which 2 bottles were positive for enterococcus, which were drawn approximately 2 weeks earlier.  The patient was started on ampicillin and gentamicin during the hospital stay.  The patient remained afebrile.  The patient had a significant bump in her creatinine while on gentamicin requiring discontinuation of gentamicin and switching to imipenem, which she is tolerating well.  The patient has remained afebrile during the hospital stay.  The patient was gently hydrated while on gentamicin and went into acute diastolic heart failure requiring IV Lasix with good diuresis.  Her renal function is gradually improving towards baseline.  The patient did receive 1 unit of packed RBC during the hospital stay.  Her hemoglobin has been  stable.  OT/PT consultation was called.  The patient is ambulating in room with assistance, but still feels weak and tired.  The patient's repeat cultures so far has been negative.  The patient had PICC line placed yesterday without problems.  The patient is tolerating antibiotics and will be discharged home on above medications for total 6 weeks of IV antibiotics till June 3rd.  The patient will be followed up by Infectious Disease as an outpatient closely and will be followed up in my office in 1 week.  We will recheck the PT/INR next week.  Her Coumadin dose has been reduced to 3 mg daily.  We will adjust the Coumadin dose as an outpatient.     Eduardo Osier. Sharyn Lull, M.D.     [facility]  D:  07/22/2012  T:  07/23/2012  Job:  [id number]

## 2012-07-23 NOTE — Telephone Encounter (Signed)
Pt had transfusion prior to d/c from hospital due to low HGB.  [facility] RN to draw CBC w/ diff and BMP during visit today.

## 2012-07-29 ENCOUNTER — Telehealth: Payer: Self-pay | Admitting: Infectious Disease

## 2012-07-29 NOTE — Telephone Encounter (Signed)
Pts creatinine is up to 2.93  I think she needs to come into the hospital for workup of her renal fxn this is SIGNIficant worsening. She likely needs DIRECT admission from ED

## 2012-08-04 ENCOUNTER — Other Ambulatory Visit: Payer: Self-pay | Admitting: Critical Care Medicine

## 2012-08-07 ENCOUNTER — Ambulatory Visit (INDEPENDENT_AMBULATORY_CARE_PROVIDER_SITE_OTHER): Payer: Medicare Other | Admitting: Internal Medicine

## 2012-08-07 ENCOUNTER — Encounter: Payer: Self-pay | Admitting: Infectious Disease

## 2012-08-07 ENCOUNTER — Ambulatory Visit (INDEPENDENT_AMBULATORY_CARE_PROVIDER_SITE_OTHER): Payer: Medicare Other | Admitting: Infectious Disease

## 2012-08-07 ENCOUNTER — Encounter: Payer: Self-pay | Admitting: Internal Medicine

## 2012-08-07 ENCOUNTER — Other Ambulatory Visit: Payer: Self-pay | Admitting: *Deleted

## 2012-08-07 VITALS — BP 127/82 | HR 68 | Temp 98.1°F | Ht 66.0 in | Wt 168.0 lb

## 2012-08-07 VITALS — BP 138/86 | HR 59 | Temp 97.9°F | Resp 14 | Ht 66.0 in | Wt 168.6 lb

## 2012-08-07 DIAGNOSIS — T826XXD Infection and inflammatory reaction due to cardiac valve prosthesis, subsequent encounter: Secondary | ICD-10-CM

## 2012-08-07 DIAGNOSIS — B952 Enterococcus as the cause of diseases classified elsewhere: Secondary | ICD-10-CM

## 2012-08-07 DIAGNOSIS — R7881 Bacteremia: Secondary | ICD-10-CM

## 2012-08-07 DIAGNOSIS — K219 Gastro-esophageal reflux disease without esophagitis: Secondary | ICD-10-CM

## 2012-08-07 DIAGNOSIS — L270 Generalized skin eruption due to drugs and medicaments taken internally: Secondary | ICD-10-CM

## 2012-08-07 DIAGNOSIS — I4891 Unspecified atrial fibrillation: Secondary | ICD-10-CM

## 2012-08-07 DIAGNOSIS — Z5189 Encounter for other specified aftercare: Secondary | ICD-10-CM

## 2012-08-07 DIAGNOSIS — I2510 Atherosclerotic heart disease of native coronary artery without angina pectoris: Secondary | ICD-10-CM

## 2012-08-07 DIAGNOSIS — M359 Systemic involvement of connective tissue, unspecified: Secondary | ICD-10-CM

## 2012-08-07 DIAGNOSIS — G2581 Restless legs syndrome: Secondary | ICD-10-CM

## 2012-08-07 DIAGNOSIS — I38 Endocarditis, valve unspecified: Secondary | ICD-10-CM

## 2012-08-07 DIAGNOSIS — E785 Hyperlipidemia, unspecified: Secondary | ICD-10-CM

## 2012-08-07 DIAGNOSIS — J8410 Pulmonary fibrosis, unspecified: Secondary | ICD-10-CM

## 2012-08-07 MED ORDER — PREDNISONE 5 MG PO TABS: 5.0000 mg | ORAL_TABLET | Freq: Every morning | ORAL | Status: DC

## 2012-08-07 NOTE — Progress Notes (Signed)
      [facility]  Subjective: No new complaints, feeling MUCH better   Antibiotics:  Anti-infectives   None     High dose continuous Ampicillin and Imipenem 500mg  q 8 hours  ROS: as in HPI otherwise negative on 12 pt review  Objective: Weight change: 5 lb 15.2 oz (2.7 kg)  Intake/Output Summary (Last 24 hours) at 07/22/12 1226 Last data filed at 07/22/12 1130  Gross per 24 hour  Intake   1420 ml  Output   2000 ml  Net   -580 ml   Blood pressure 127/82, pulse 68, temperature 98.1 F (36.7 C), temperature source Oral, height 5\' 6"  (1.676 m), weight 168 lb (76.204 kg), SpO2 90.00%. Temp:  [97.9 F (36.6 C)-98.7 F (37.1 C)] 98.3 F (36.8 C) (04/29 1145) Pulse Rate:  [71-108] 81 (04/29 1145) Resp:  [20-44] 30 (04/29 1145) BP: (96-160)/(63-88) 131/63 mmHg (04/29 1145) SpO2:  [85 %-100 %] 95 % (04/29 1145) Weight:  [188 lb 4.4 oz (85.4 kg)] 188 lb 4.4 oz (85.4 kg) (04/29 0338)  Physical Exam: General: Alert and awake, oriented x3, not in any acute distress.  HEENT: anicteric sclera,, EOMI, oropharynx clear and without exudate  CVS irr, regular rate, normal r, II/VI SEM  Chest: clear to auscultation bilaterally, no wheezing, rales or rhonchi  Abdomen: soft nontender, nondistended, normal bowel sounds,  Extremities: no clubbing or edema noted bilaterally  Skin: PICC clean Neuro: nonfocal, strength and sensation intact     Assessment/Plan: [name] is a 72 y.o. 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 Dorris [name] the pt is NOt an operative candidate   pt needs 6 weeks of IV antibiotics from date of first negative blood culture = 42314 -->thru June the 3rd --I will ask [facility] pharmacy if they think IMIPENEM could be increased to q 6 hour dosing  We will check surveillance  cultures in 2 weeks post finishing abx  #2 Drug rash: has NOT happened on AMp and IMi. ? This ceph rash???   LOS: 11 days   [name] 08/07/2012, 3:24 PM

## 2012-08-07 NOTE — Progress Notes (Signed)
Patient ID: [name], female   DOB: [birth year], 72 y.o.   MRN: [id number] Code Status: getting living will and HCPOA done at this time at attorney's office, full code at this time  Allergies  Allergen Reactions  . Codeine Nausea Only    Chief Complaint  Patient presents with  . NP to establish    HPI: Patient is a 72 y.o. Caucasian female seen in the office today to establish with the practice.   Pt is here with her daughter.  She has several doctors with their own specialty.  Needs someone to coordinate everything.    Feels fine.  Her daughter notes she is doing a lot better than a week ago.  Last 2 major events had to do with infections--begin as UTIs and move to bloodstream.  In fall and 2 weeks ago for same thing.  Both required picc for IV abx.  Has valve replacement and has gotten endocarditis of this each time. Sees ID, Dr. Algis Liming.  Sees Dr. Sharyn Lull for cardiology.  Has had multiple angiograms. Had open surgery for pig valve replacement.  Has afib.  Is on coumadin for over 2 years.  Dr. Sharyn Lull follows INR.  Beta blocker was reduced and dig added at hospital discharge due to hypotension.  Pt lives alone and does not tell family when feels bad.  Doesn't call anyone and just goes to bed.  Has three daughters and a son.  Someone speaks with her at least daily.  She feels harassed at times.     Has had diagnosis of lupus about 10 years.  Has extreme photosensitivity from it that provokes skin problems.  Has bad days where she is just exhausted and can't get out of bed.  Discovered in hospital in lungs.  Has limited lung capacity.  Is on oxygen right now--comes and goes--2 liters at present.  Max has been 4L.  Been for 1.65yr.  Uses at bedtime.  Dr. Azzie Roup is rheumatologist.  Dr. Delford Field is pulmonologist.    Sees Dr. Dione Booze for ophthalmology--missed appt a month ago b/c in hospital.  Needs to reschedule.    Lasix is new to her regimen--was added when bloodwork returned last week.   Feet and legs were very swollen.  Runs to bathroom.  Has commode if has to go very badly.  Bathroom is far away.  When doesn't feel well, goes days without eating.  Daughter asks about appetite-enhancing medicine.  Is living with her daughter who is cooking for her at present while sick.    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  Did see audiology when happened but nothing since.    Review of Systems:  Review of Systems  Constitutional: Positive for weight loss and malaise/fatigue. Negative for fever and chills.       30-40 lbs since fall  Eyes: Negative for blurred vision and double vision.  Respiratory: Positive for cough and shortness of breath. Negative for sputum production.        Nonproductive Sob only when active  Cardiovascular: Positive for leg swelling. Negative for chest pain and palpitations.  Gastrointestinal: Negative for heartburn and constipation.  Genitourinary: Positive for frequency. Negative for dysuria and urgency.  Musculoskeletal:       Restless legs give her a fit sometimes--requip doesn't help much  Skin: Negative for itching and rash.  Neurological: Positive for weakness. Negative for dizziness, tingling, focal weakness and headaches.  Endo/Heme/Allergies: Bruises/bleeds easily.  Psychiatric/Behavioral: Positive for memory loss. Negative for depression. The  patient is not nervous/anxious.        Says she thinks she is mean as a snake--daughters say a little grumpy Sleeps in day more than night Pt says memory trouble, but daughters haven't noticed any decline   Past Medical History  Diagnosis Date  . Coronary artery disease   . Atrial fibrillation     Now NSR  . GERD (gastroesophageal reflux disease)   . Hyperlipidemia   . 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 Anemia   . Angina   . Shortness of breath     with activity  . Hypertension   . Blood transfusion   . Pneumonia   . H/O hiatal hernia   . Lupus   . Insomnia     Past Surgical History  Procedure Laterality Date  . Cardiac catheterization    . Cardiac valve replacement      2006  . Coronary artery bypass graft    . Givens capsule study  04/09/2011    Procedure: GIVENS CAPSULE STUDY;  Surgeon: Theda Belfast, MD;  Location: [facility] ENDOSCOPY;  Service: Endoscopy;  Laterality: N/A;  . Tee without cardioversion  02/06/2012    Procedure: TRANSESOPHAGEAL ECHOCARDIOGRAM (TEE);  Surgeon: Ricki Rodriguez, MD;  Location: [facility] ENDOSCOPY;  Service: Cardiovascular;  Laterality: N/A;  . Esophagogastroduodenoscopy  02/12/2012    Procedure: ESOPHAGOGASTRODUODENOSCOPY (EGD);  Surgeon: Theda Belfast, MD;  Location: [facility] ENDOSCOPY;  Service: Endoscopy;  Laterality: N/A;  . Tee without cardioversion N/A 07/15/2012    Procedure: TRANSESOPHAGEAL ECHOCARDIOGRAM (TEE);  Surgeon: Ricki Rodriguez, MD;  Location: [facility] ENDOSCOPY;  Service: Cardiovascular;  Laterality: N/A;  . Breast lumpectomy  1977   Social History:   reports that she has never smoked. She has never used smokeless tobacco. She reports that she does not drink alcohol. Her drug history is not on file.  Family History  Problem Relation Age of Onset  . Heart disease Mother     CHF  . Mental illness Father     suicide  . Cancer Sister     pancreatic cancer  . COPD Brother     Medications: Patient's Medications  New Prescriptions   No medications on file  Previous Medications   DIGOXIN (LANOXIN) 0.125 MG TABLET    Take 1 tablet (0.125 mg total) by mouth daily.   FUROSEMIDE (LASIX) 40 MG TABLET    Take one tablet once daily   HYDROXYCHLOROQUINE (PLAQUENIL) 200 MG TABLET    Take 200 mg by mouth 2 (two) times daily.    METOLAZONE (ZAROXOLYN) 2.5 MG TABLET    Take 1 tablet (2.5 mg total) by mouth daily.   METOPROLOL (LOPRESSOR) 50 MG TABLET    Take 0.5 tablets (25 mg total) by mouth 2 (two) times daily.   PANTOPRAZOLE (PROTONIX) 40 MG TABLET    TAKE 1 TABLET BY MOUTH ONCE EVERY MORNING   ROPINIROLE (REQUIP) 1 MG  TABLET    Take 1 mg by mouth at bedtime. Daily at bedtime   ROSUVASTATIN (CRESTOR) 10 MG TABLET    Take 10 mg by mouth daily.     SODIUM CHLORIDE 0.9 % SOLN 100 ML WITH IMIPENEM-CILASTATIN 250 MG SOLR 250 MG    Inject 250 mg into the vein every 6 (six) hours.   SODIUM CHLORIDE 0.9 % SOLN 50 ML WITH AMPICILLIN 2 G SOLR 2 G    Inject 2 g into the vein every 4 (four) hours.  SULFASALAZINE (AZULFIDINE) 500 MG TABLET    Take 1,000 mg by mouth 2 (two) times daily.   Modified Medications   Modified Medication Previous Medication   POTASSIUM CHLORIDE SA (K-DUR,KLOR-CON) 20 MEQ TABLET potassium chloride SA (K-DUR,KLOR-CON) 20 MEQ tablet      4 (four) times daily - after meals and at bedtime. Take one tablet by mouth twice daily for potassium    Take 1 tablet (20 mEq total) by mouth once.   PREDNISONE (DELTASONE) 5 MG TABLET predniSONE (DELTASONE) 5 MG tablet      Take 1 tablet (5 mg total) by mouth every morning.    Take 5 mg by mouth every morning.    WARFARIN (COUMADIN) 3 MG TABLET warfarin (COUMADIN) 3 MG tablet      Take 1.5 mg by mouth daily.    Take 1 tablet (3 mg total) by mouth daily.  Discontinued Medications   FEEDING SUPPLEMENT (RESOURCE BREEZE) LIQD    Take 1 Container by mouth 3 (three) times daily between meals.   ONDANSETRON (ZOFRAN) 4 MG TABLET    Take 1 tablet (4 mg total) by mouth 4 (four) times daily as needed for nausea.   POTASSIUM CHLORIDE SA (KLOR-CON M20) 20 MEQ TABLET    Take 2 tablets (40 mEq total) by mouth 2 (two) times daily.   WARFARIN (COUMADIN) 4 MG TABLET    Take 4 mg by mouth See admin instructions. Take everyday except Sunday   Physical Exam: Filed Vitals:   08/07/12 0834  BP: 138/86  Pulse: 59  Temp: 97.9 F (36.6 C)  TempSrc: Oral  Resp: 14  Height: 5\' 6"  (1.676 m)  Weight: 168 lb 9.6 oz (76.476 kg)  Physical Exam  Constitutional: She is oriented to person, place, and time.  Overweight Caucasian female, appears chronically ill, very pale;  On oxygen   HENT:  Head: Normocephalic and atraumatic.  Right Ear: External ear normal.  Left Ear: External ear normal.  Nose: Nose normal.  Mouth/Throat: Oropharynx is clear and moist. No oropharyngeal exudate.  Eyes: Conjunctivae and EOM are normal. Pupils are equal, round, and reactive to light.  Neck: Normal range of motion. Neck supple. No JVD present. No thyromegaly present.  Cardiovascular: Normal rate and regular rhythm.  Exam reveals no gallop and no friction rub.   Murmur heard. Seems to be in sinus rhythm today;  Has PICC line in right upper extremity  Pulmonary/Chest: Effort normal and breath sounds normal.  Abdominal: Soft. Bowel sounds are normal.  Musculoskeletal: Normal range of motion.  Neurological: She is alert and oriented to person, place, and time. No cranial nerve deficit.  Skin: Skin is warm and dry. No rash noted. There is pallor.  Psychiatric: She has a normal mood and affect. Her behavior is normal. Judgment and thought content normal.   Labs reviewed: Basic Metabolic Panel:  Recent Labs  16/10/96 2155  07/15/12 0420 07/16/12 2114  07/20/12 0550 07/21/12 0700 07/22/12 0500  NA 134*  < > 131* 132*  < > 135 136 136  K 3.7  < > 3.8 4.3  < > 3.6 3.1* 3.4*  CL 99  < > 97 96  < > 101 102 101  CO2 24  < > 27 25  < > 23 23 24   GLUCOSE 114*  < > 98 110*  < > 96 89 95  BUN 21  < > 22 20  < > 22 23 21   CREATININE 1.22*  < > 1.11* 1.19*  < >  1.48* 1.43* 1.31*  CALCIUM 8.5  < > 8.9 8.8  < > 8.6 8.5 8.8  MG 1.7  --   --   --   --   --   --   --   PHOS  --   --   --  3.1  --   --   --   --   TSH 1.844  --   --   --   --   --   --   --   < > = values in this interval not displayed. Liver Function Tests:  Recent Labs  03/08/12 0133 06/29/12 1724 07/11/12 1901 07/16/12 2114  AST 25 22 25   --   ALT 18 6 9   --   ALKPHOS 125* 90 96  --   BILITOT 0.9 0.7 0.9  --   PROT 6.3 7.6 7.1  --   ALBUMIN 2.6* 3.0* 2.4* 2.3*    Recent Labs  06/29/12 1724 07/11/12 1901   LIPASE 72* 49   CBC:  Recent Labs  03/21/12 1630 06/29/12 1724 07/11/12 1901  07/20/12 0550 07/21/12 0700 07/22/12 0500  WBC 5.3 9.7 10.0  < > 7.8 5.2 7.6  NEUTROABS 2.2 7.0 7.6  --   --   --   --   HGB 12.8 10.7* 9.6*  < > 8.4* 7.7* 9.3*  HCT 39.4 30.4* 27.8*  < > 24.5* 22.3* 26.7*  MCV 99.0 88.9 89.1  < > 89.7 88.8 88.7  PLT 160 144* 177  < > 126* 116* 138*  < > = values in this interval not displayed. Lipid Panel:  Recent Labs  02/04/12 0230  CHOL 88  HDL 28*  LDLCALC 48  TRIG 61  CHOLHDL 3.1    Past Procedures: 2013 colonoscopy  Assessment/Plan HYPERLIPIDEMIA She is on crestor 10mg .  Her last lipids 6 mos ago were at goal, but she had very low HDL.    RESTLESS LEG SYNDROME, MILD She is now taking an iron supplement.  She also takes requip 1mg  at bedtime.  Will plan to f/u her ferritin levels in about 6 mos--if they normalize, will consider trying to d/c the requip.  CORONARY ARTERY DISEASE Has had multiple previous angioplasties. Continue lopressor, crestor.  She is not on aspirin--likely due to her coumadin.    Atrial fibrillation She is on chronic coumadin for paroxysmal afib.  She has several risk factors for stroke with CHF, HTN, Age and inflammatory disease.  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  Her INR is monitored by cardiology.  It was 2.96 on 07/12/12.  During her last hospitalization, digoxin was added and her beta blocker reduced due to hypotension.    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  This is the primary manifestation of her lupus.  Follows with Dr. Dareen Piano.  GERD Continue protonix.  Symptoms are stable with therapy.  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  I will need to confirm if she is also still on methotrexate weekly and benlysta monthly.  She should also be on folic acid.    Prosthetic valve endocarditis With enterococcal bacteremia.  She is following with Dr.  Daiva Eves and currently has a PICC line in place receiving IV abx:   Imipenem/cilastin and ampicillin since 07/12/12.     Tylenol for pain Labs/tests ordered:  None today.  Reviewed recent hospital labs.   F/u in 6 wks.

## 2012-08-07 NOTE — Patient Instructions (Addendum)
Tylenol ES 500mg  up to 6 tablets in a day for pain.

## 2012-08-10 DIAGNOSIS — T826XXA Infection and inflammatory reaction due to cardiac valve prosthesis, initial encounter: Secondary | ICD-10-CM | POA: Insufficient documentation

## 2012-08-10 DIAGNOSIS — I38 Endocarditis, valve unspecified: Secondary | ICD-10-CM | POA: Insufficient documentation

## 2012-08-10 NOTE — Assessment & Plan Note (Signed)
She is on crestor 10mg .  Her last lipids 6 mos ago were at goal, but she had very low HDL.

## 2012-08-10 NOTE — Assessment & Plan Note (Addendum)
Continues on sulfasalazine and plaquenil.  I will need to confirm if she is also still on methotrexate weekly and benlysta monthly.  She should also be on folic acid.

## 2012-08-10 NOTE — Assessment & Plan Note (Signed)
Is on O2 2 liters continuously.  This is the primary manifestation of her lupus.  Follows with Dr. Dareen Piano.

## 2012-08-10 NOTE — Assessment & Plan Note (Signed)
Continue protonix.  Symptoms are stable with therapy.

## 2012-08-10 NOTE — Assessment & Plan Note (Signed)
She is on chronic coumadin for paroxysmal afib.  She has several risk factors for stroke with CHF, HTN, Age and inflammatory disease.  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  Her INR is monitored by cardiology.  It was 2.96 on 07/12/12.  During her last hospitalization, digoxin was added and her beta blocker reduced due to hypotension.

## 2012-08-10 NOTE — Assessment & Plan Note (Addendum)
With enterococcal bacteremia.  She is following with Dr. Daiva Eves and currently has a PICC line in place receiving IV abx:   Imipenem/cilastin and ampicillin since 07/12/12.

## 2012-08-10 NOTE — Assessment & Plan Note (Signed)
She is now taking an iron supplement.  She also takes requip 1mg  at bedtime.  Will plan to f/u her ferritin levels in about 6 mos--if they normalize, will consider trying to d/c the requip.

## 2012-08-10 NOTE — Assessment & Plan Note (Signed)
Has had multiple previous angioplasties. Continue lopressor, crestor.  She is not on aspirin--likely due to her coumadin.

## 2012-08-13 ENCOUNTER — Encounter: Payer: Self-pay | Admitting: Infectious Diseases

## 2012-08-13 ENCOUNTER — Telehealth: Payer: Self-pay | Admitting: *Deleted

## 2012-08-13 NOTE — Telephone Encounter (Signed)
Asher Muir could we possibly overbook ms Macdonell with one of other ID clinic [facility] tomorrow? I dont have room today.  I left message on phone for daughter. This pt had rought time with recurrent enterococcal endocarditis

## 2012-08-13 NOTE — Telephone Encounter (Signed)
Yes a 2pm slot opened up on Snider's schedule.  Will put her there. Thanks Asher Muir

## 2012-08-13 NOTE — Telephone Encounter (Signed)
Thanks

## 2012-08-13 NOTE — Telephone Encounter (Signed)
Pt having non-productive coughing episodes where she has actually vomited.  No fever.  Pt very fatigued.  Requesting a call back from MD to Carisma Troupe, pt's daughter @ [phone].

## 2012-08-14 ENCOUNTER — Encounter: Payer: Self-pay | Admitting: Infectious Disease

## 2012-08-14 ENCOUNTER — Ambulatory Visit: Payer: Medicare Other | Admitting: Internal Medicine

## 2012-08-25 ENCOUNTER — Encounter: Payer: Self-pay | Admitting: Infectious Disease

## 2012-08-25 ENCOUNTER — Telehealth: Payer: Self-pay

## 2012-08-25 NOTE — Telephone Encounter (Signed)
Home health nurse calling with information regarding IV Atbx which is due to stop today.  Pt has return appointment for  September 10, 2012. Nurse wants to know if it is okay for PICC to be removed and if she need oral antibiotics.  Please advise.   Laurell Josephs, RN

## 2012-08-25 NOTE — Telephone Encounter (Signed)
If she has completed 6 weeks then PICC is to be pulled

## 2012-08-28 ENCOUNTER — Encounter: Payer: Self-pay | Admitting: Infectious Disease

## 2012-09-08 ENCOUNTER — Ambulatory Visit: Payer: Medicare Other | Admitting: Infectious Disease

## 2012-09-10 ENCOUNTER — Ambulatory Visit: Payer: Medicare Other | Admitting: Infectious Disease

## 2012-09-10 ENCOUNTER — Ambulatory Visit (INDEPENDENT_AMBULATORY_CARE_PROVIDER_SITE_OTHER): Payer: Medicare Other | Admitting: Infectious Disease

## 2012-09-10 ENCOUNTER — Encounter: Payer: Self-pay | Admitting: Infectious Disease

## 2012-09-10 VITALS — BP 116/78 | HR 88 | Temp 98.1°F | Wt 158.0 lb

## 2012-09-10 DIAGNOSIS — I38 Endocarditis, valve unspecified: Secondary | ICD-10-CM

## 2012-09-10 DIAGNOSIS — Z5189 Encounter for other specified aftercare: Secondary | ICD-10-CM

## 2012-09-10 DIAGNOSIS — T826XXD Infection and inflammatory reaction due to cardiac valve prosthesis, subsequent encounter: Secondary | ICD-10-CM

## 2012-09-10 DIAGNOSIS — L270 Generalized skin eruption due to drugs and medicaments taken internally: Secondary | ICD-10-CM

## 2012-09-10 DIAGNOSIS — R7881 Bacteremia: Secondary | ICD-10-CM

## 2012-09-10 DIAGNOSIS — B952 Enterococcus as the cause of diseases classified elsewhere: Secondary | ICD-10-CM

## 2012-09-10 NOTE — Progress Notes (Signed)
      [facility]  HPI  72 y.o. 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 She is now out 2 weeks from last dose of abx.She is without fevers nausea, malaise.    Review of Systems  Constitutional: Negative for fever, chills, diaphoresis, activity change, appetite change, fatigue and unexpected weight change.  HENT: Negative for congestion, sore throat, rhinorrhea, sneezing, trouble swallowing and sinus pressure.   Eyes: Negative for photophobia and visual disturbance.  Respiratory: Negative for cough, chest tightness, shortness of breath, wheezing and stridor.   Cardiovascular: Negative for chest pain, palpitations and leg swelling.  Gastrointestinal: Negative for nausea, vomiting, abdominal pain, diarrhea, constipation, blood in stool, abdominal distention and anal bleeding.  Genitourinary: Negative for dysuria, hematuria, flank pain and difficulty urinating.  Musculoskeletal: Negative for myalgias, back pain, joint swelling, arthralgias and gait problem.  Skin: Negative for color change, pallor, rash and wound.  Neurological: Negative for dizziness, tremors, weakness and light-headedness.  Hematological: Negative for adenopathy. Does not bruise/bleed easily.  Psychiatric/Behavioral: Negative for behavioral problems, confusion, sleep disturbance, dysphoric mood, decreased concentration and agitation.     Physical Exam  Constitutional: She is oriented to person, place, and time. No distress.  HENT:  Head: Normocephalic and atraumatic.  Mouth/Throat: Oropharynx is clear and moist.  Eyes: EOM are normal.  Neck: Normal range of motion. Neck supple.  Cardiovascular: Normal rate and regular rhythm.  Exam reveals no gallop and no friction rub.   Murmur heard.  Systolic murmur is present with a grade of 2/6  Pulmonary/Chest:  Effort normal and breath sounds normal. No respiratory distress. She has no wheezes. She has no rales. She exhibits no tenderness.  Abdominal: She exhibits no distension and no mass. There is no tenderness. There is no rebound and no guarding.  Musculoskeletal: She exhibits no edema and no tenderness.  Neurological: She is alert and oriented to person, place, and time. She has normal reflexes. She exhibits normal muscle tone. Coordination normal.  Skin: Skin is warm and dry. She is not diaphoretic. No erythema. No pallor.     Psychiatric: She has a normal mood and affect. Her behavior is normal. Judgment and thought content normal.    Assessment/Plan: [name] is a 71 y.o. 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 Dorris Fetch the pt is NOt an operative candidate She has now finished 6 weeks of ampicillin and imipenem  --check surveillance cultures --pull PICC line --rtc in 2-3 months  #2 Drug rash: has NOT happened on AMp and IMi. ? This ceph rash???

## 2012-09-10 NOTE — Progress Notes (Signed)
RN received verbal order to discontinue the patient's PICC line.  Patient identified with name and date of birth. PICC dressing removed, site unremarkable.  PICC line removed using sterile procedure @ 1200. PICC length equal to that noted in patient's hospital chart of 03 cm. Sterile petroleum gauze + sterile 4X4 applied to PICC site, pressure applied for 10 minutes and covered with Medipore tape as a pressure dressing. Patient tolerated procedure without complaints.  Patient instructed to limit use of arm for 1 hour. Patient instructed that the pressure dressing should remain in place for 24 hours. Patient verbalized understanding of these instructions.

## 2012-09-15 ENCOUNTER — Ambulatory Visit (INDEPENDENT_AMBULATORY_CARE_PROVIDER_SITE_OTHER): Payer: Medicare Other | Admitting: Internal Medicine

## 2012-09-15 ENCOUNTER — Encounter: Payer: Self-pay | Admitting: Internal Medicine

## 2012-09-15 VITALS — BP 118/70 | HR 60 | Temp 98.0°F | Resp 18 | Ht 66.0 in | Wt 163.0 lb

## 2012-09-15 DIAGNOSIS — I4891 Unspecified atrial fibrillation: Secondary | ICD-10-CM

## 2012-09-15 DIAGNOSIS — T826XXD Infection and inflammatory reaction due to cardiac valve prosthesis, subsequent encounter: Secondary | ICD-10-CM

## 2012-09-15 DIAGNOSIS — Z5189 Encounter for other specified aftercare: Secondary | ICD-10-CM

## 2012-09-15 DIAGNOSIS — I38 Endocarditis, valve unspecified: Secondary | ICD-10-CM

## 2012-09-15 DIAGNOSIS — G2581 Restless legs syndrome: Secondary | ICD-10-CM

## 2012-09-15 DIAGNOSIS — E785 Hyperlipidemia, unspecified: Secondary | ICD-10-CM

## 2012-09-15 DIAGNOSIS — J8410 Pulmonary fibrosis, unspecified: Secondary | ICD-10-CM

## 2012-09-15 NOTE — Progress Notes (Signed)
Patient ID: [name], female   DOB: [birth year], 72 y.o.   MRN: [id number] Location:  [facility]  Code Status: full code;  Will bring living will and hcpoa next time  Allergies  Allergen Reactions  . Ceftriaxone     RASH BUT TOLERATED AMPICILLIN AND IMIPENEM WITHOUT PROBLEMS  . Codeine Nausea Only    Chief Complaint  Patient presents with  . Medical Managment of Chronic Issues    no new problems    HPI: Patient is a 72 y.o. white female seen in the office today for medical mgt of chronic diseases.  I have reviewed Dr. Lattie Haw note from ID re: her endocarditis.  She has completed her IV abx and had her picc line pulled.  Feeling good.  Daughter says she is much better now.    Restless legs are not doing great.  Do ok if takes her pills.    Discussed driving--daughter questions if she has the reflexes for driving.  She is alert and oriented and cognitively intact.  I said she should be ok to drive if her daughters feel comfortable riding in the car with her driving.    Review of Systems:  Review of Systems  Constitutional: Negative for weight loss.  Eyes:       Still need to reschedule eye appt  Respiratory: Negative for cough and shortness of breath.        Some dyspnea on exertion with a "long distance"--to mailbox and back at home, does ok around the house  Cardiovascular: Negative for chest pain and leg swelling.  Gastrointestinal: Negative for heartburn, diarrhea and constipation.  Musculoskeletal: Negative for falls.       Still a little balance trouble, "bounces off walls"  Skin: Negative for rash.  Neurological: Negative for dizziness and headaches.  Endo/Heme/Allergies: Bruises/bleeds easily.  Psychiatric/Behavioral: Negative for depression.       Par for the course, she says.  Pt says her memory "sucks", but daughter has not noticed anything    Past Medical History  Diagnosis Date  . Coronary artery disease   .  Atrial fibrillation     Now NSR  . GERD (gastroesophageal reflux disease)   . Hyperlipidemia   . 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 Anemia   . Angina   . Shortness of breath     with activity  . Hypertension   . Blood transfusion   . Pneumonia   . H/O hiatal hernia   . Lupus   . Insomnia   . Endocarditis   . Prosthetic valve endocarditis   . Enterococcal bacteremia   . Drug rash     Past Surgical History  Procedure Laterality Date  . Cardiac catheterization    . Cardiac valve replacement      2006  . Coronary artery bypass graft    . Givens capsule study  04/09/2011    Procedure: GIVENS CAPSULE STUDY;  Surgeon: Theda Belfast, MD;  Location: [facility] ENDOSCOPY;  Service: Endoscopy;  Laterality: N/A;  . Tee without cardioversion  02/06/2012    Procedure: TRANSESOPHAGEAL ECHOCARDIOGRAM (TEE);  Surgeon: Ricki Rodriguez, MD;  Location: [facility] ENDOSCOPY;  Service: Cardiovascular;  Laterality: N/A;  . Esophagogastroduodenoscopy  02/12/2012    Procedure: ESOPHAGOGASTRODUODENOSCOPY (EGD);  Surgeon: Theda Belfast, MD;  Location: [facility] ENDOSCOPY;  Service: Endoscopy;  Laterality: N/A;  . Tee without cardioversion N/A 07/15/2012    Procedure: TRANSESOPHAGEAL  ECHOCARDIOGRAM (TEE);  Surgeon: Ricki Rodriguez, MD;  Location: [facility] ENDOSCOPY;  Service: Cardiovascular;  Laterality: N/A;  . Breast lumpectomy  1977    Social History:   reports that she has never smoked. She has never used smokeless tobacco. She reports that she does not drink alcohol. Her drug history is not on file.  Family History  Problem Relation Age of Onset  . Heart disease Mother     CHF  . Mental illness Father     suicide  . Cancer Sister     pancreatic cancer  . COPD Brother     Medications: Patient's Medications  New Prescriptions   No medications on file  Previous Medications   DIGOXIN (LANOXIN) 0.125 MG TABLET    Take 1 tablet (0.125 mg total) by mouth daily.   FUROSEMIDE (LASIX) 40 MG  TABLET    Take one tablet once daily   HYDROXYCHLOROQUINE (PLAQUENIL) 200 MG TABLET    Take 200 mg by mouth 2 (two) times daily.    METOLAZONE (ZAROXOLYN) 2.5 MG TABLET    Take 1 tablet (2.5 mg total) by mouth daily.   METOPROLOL (LOPRESSOR) 50 MG TABLET    Take 0.5 tablets (25 mg total) by mouth 2 (two) times daily.   PANTOPRAZOLE (PROTONIX) 40 MG TABLET    TAKE 1 TABLET BY MOUTH ONCE EVERY MORNING   POTASSIUM CHLORIDE SA (K-DUR,KLOR-CON) 20 MEQ TABLET    5 (five) times daily. Take one tablet by mouth twice daily for potassium   PREDNISONE (DELTASONE) 5 MG TABLET    Take 1 tablet (5 mg total) by mouth every morning.   ROPINIROLE (REQUIP) 1 MG TABLET    Take 1 mg by mouth at bedtime. Daily at bedtime   ROSUVASTATIN (CRESTOR) 10 MG TABLET    Take 10 mg by mouth daily.     SULFASALAZINE (AZULFIDINE) 500 MG TABLET    Take 1,000 mg by mouth 2 (two) times daily.    WARFARIN (COUMADIN) 3 MG TABLET    Take 3 mg by mouth daily.   Modified Medications   No medications on file  Discontinued Medications   No medications on file   Physical Exam: Filed Vitals:   09/15/12 0958  BP: 118/70  Pulse: 60  Temp: 98 F (36.7 C)  TempSrc: Oral  Resp: 18  Height: 5\' 6"  (1.676 m)  Weight: 163 lb (73.936 kg)  SpO2: 98%  Physical Exam  Constitutional: She is oriented to person, place, and time. No distress.  HENT:  Head: Normocephalic and atraumatic.  Cardiovascular: Intact distal pulses.   Systolic murmur, irreg irreg  Pulmonary/Chest: Effort normal.  Scattered coarse crackles, on nasal canula oxygen  Abdominal: Soft. Bowel sounds are normal. She exhibits no distension.  Musculoskeletal: Normal range of motion. She exhibits no edema and no tenderness.  Neurological: She is alert and oriented to person, place, and time. She has normal reflexes. No cranial nerve deficit.  Skin: Skin is warm and dry.  More color than last visit  Psychiatric: She has a normal mood and affect. Her behavior is normal.  Judgment and thought content normal.  "perkier" than last visit--is talking to me more    Labs reviewed: Basic Metabolic Panel:  Recent Labs  16/10/96 2155  07/15/12 0420 07/16/12 2114  07/20/12 0550 07/21/12 0700 07/22/12 0500  NA 134*  < > 131* 132*  < > 135 136 136  K 3.7  < > 3.8 4.3  < > 3.6 3.1* 3.4*  CL 99  < > 97 96  < > 101 102 101  CO2 24  < > 27 25  < > 23 23 24   GLUCOSE 114*  < > 98 110*  < > 96 89 95  BUN 21  < > 22 20  < > 22 23 21   CREATININE 1.22*  < > 1.11* 1.19*  < > 1.48* 1.43* 1.31*  CALCIUM 8.5  < > 8.9 8.8  < > 8.6 8.5 8.8  MG 1.7  --   --   --   --   --   --   --   PHOS  --   --   --  3.1  --   --   --   --   TSH 1.844  --   --   --   --   --   --   --   < > = values in this interval not displayed. Liver Function Tests:  Recent Labs  03/08/12 0133 06/29/12 1724 07/11/12 1901 07/16/12 2114  AST 25 22 25   --   ALT 18 6 9   --   ALKPHOS 125* 90 96  --   BILITOT 0.9 0.7 0.9  --   PROT 6.3 7.6 7.1  --   ALBUMIN 2.6* 3.0* 2.4* 2.3*    Recent Labs  06/29/12 1724 07/11/12 1901  LIPASE 72* 49   CBC:  Recent Labs  03/21/12 1630 06/29/12 1724 07/11/12 1901  07/20/12 0550 07/21/12 0700 07/22/12 0500  WBC 5.3 9.7 10.0  < > 7.8 5.2 7.6  NEUTROABS 2.2 7.0 7.6  --   --   --   --   HGB 12.8 10.7* 9.6*  < > 8.4* 7.7* 9.3*  HCT 39.4 30.4* 27.8*  < > 24.5* 22.3* 26.7*  MCV 99.0 88.9 89.1  < > 89.7 88.8 88.7  PLT 160 144* 177  < > 126* 116* 138*  < > = values in this interval not displayed. Lipid Panel:  Recent Labs  02/04/12 0230  CHOL 88  HDL 28*  LDLCALC 48  TRIG 61  CHOLHDL 3.1   Assessment/Plan HYPERLIPIDEMIA Last lipids were at goal.  Would check a year from then.  RESTLESS LEG SYNDROME, MILD Has had all of her life.  I will check her ferritin level along with her labs from cardiology at [facility]  Atrial fibrillation Stable on coumadin through her cardiologist.  Was newly begun on dig at the hospital so I will f/u that  level.  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  F/u bmp due to meds.  Also to see her ophthalmologist.    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  Has pending blood cultures from ID.  Negative preliminarily.     Labs/tests ordered:  Dig, bmp, ferritin with labs for cardiology on Monday  Next appt:  3 mos

## 2012-09-15 NOTE — Assessment & Plan Note (Signed)
Stable on coumadin through her cardiologist.  Was newly begun on dig at the hospital so I will f/u that level.

## 2012-09-15 NOTE — Assessment & Plan Note (Signed)
Has had all of her life.  I will check her ferritin level along with her labs from cardiology at [facility]

## 2012-09-15 NOTE — Assessment & Plan Note (Signed)
Completed her ampicillin and imipenem therapy two weeks ago and had picc removed.  Has pending blood cultures from ID.  Negative preliminarily.

## 2012-09-15 NOTE — Assessment & Plan Note (Signed)
Last lipids were at goal.  Would check a year from then.

## 2012-09-15 NOTE — Assessment & Plan Note (Signed)
Is stable at this time.  F/u bmp due to meds.  Also to see her ophthalmologist.

## 2012-09-17 LAB — CULTURE, BLOOD (SINGLE): Organism ID, Bacteria: NO GROWTH

## 2012-09-18 ENCOUNTER — Telehealth: Payer: Self-pay | Admitting: *Deleted

## 2012-09-18 NOTE — Telephone Encounter (Signed)
Shared that both cultures were negative for growth.  Daughter verbalized understanding.

## 2012-12-01 ENCOUNTER — Other Ambulatory Visit: Payer: Self-pay | Admitting: Critical Care Medicine

## 2012-12-09 ENCOUNTER — Encounter: Payer: Self-pay | Admitting: Internal Medicine

## 2012-12-09 ENCOUNTER — Ambulatory Visit (INDEPENDENT_AMBULATORY_CARE_PROVIDER_SITE_OTHER): Payer: Medicare Other | Admitting: Internal Medicine

## 2012-12-09 VITALS — BP 134/67 | HR 81 | Temp 98.2°F | Wt 151.2 lb

## 2012-12-09 DIAGNOSIS — R634 Abnormal weight loss: Secondary | ICD-10-CM

## 2012-12-09 LAB — COMPREHENSIVE METABOLIC PANEL
ALT: <8 U/L (ref 0–35)
Alkaline Phosphatase: 109 U/L (ref 39–117)
Creat: 1.21 mg/dL — ABNORMAL HIGH (ref 0.50–1.10)
Sodium: 137 mEq/L (ref 135–145)
Total Bilirubin: 0.6 mg/dL (ref 0.3–1.2)
Total Protein: 7.5 g/dL (ref 6.0–8.3)

## 2012-12-09 LAB — CBC
MCH: 30.9 pg (ref 26.0–34.0)
MCHC: 34.2 g/dL (ref 30.0–36.0)
Platelets: 167 10*3/uL (ref 150–400)

## 2012-12-09 NOTE — Progress Notes (Signed)
Patient ID: [name], female   DOB: March 23, 1941, 72 y.o.   MRN: [id number]         [facility] for Infectious Disease  Patient Active Problem List   Diagnosis Date Noted  . Unintentional weight loss 12/09/2012  . Prosthetic valve endocarditis 08/10/2012  . Endocarditis 03/05/2012  . Rash 03/05/2012  . NSTEMI (non-ST elevated myocardial infarction) 02/04/2012  . Pneumonia 01/05/2012  . 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 Nonspecific abnormal finding in stool contents 04/08/2011  . Anemia associated with acute blood loss 04/06/2011  . 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 Atrial fibrillation 12/13/2009  . HYPERLIPIDEMIA 01/27/2007  . RESTLESS LEG SYNDROME, MILD 01/27/2007  . CORONARY ARTERY DISEASE 01/27/2007  . 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 GERD 01/27/2007  . CORONARY ARTERY BYPASS GRAFT, HX OF 01/27/2007    Patient's Medications  New Prescriptions   No medications on file  Previous Medications   DIGOXIN (LANOXIN) 0.125 MG TABLET    Take 1 tablet (0.125 mg total) by mouth daily.   FUROSEMIDE (LASIX) 40 MG TABLET    Take one tablet once daily   HYDROXYCHLOROQUINE (PLAQUENIL) 200 MG TABLET    Take 200 mg by mouth 2 (two) times daily.    METOLAZONE (ZAROXOLYN) 2.5 MG TABLET    Take 1 tablet (2.5 mg total) by mouth daily.   METOPROLOL (LOPRESSOR) 50 MG TABLET    Take 0.5 tablets (25 mg total) by mouth 2 (two) times daily.   PANTOPRAZOLE (PROTONIX) 40 MG TABLET    TAKE 1 TABLET BY MOUTH ONCE EVERY MORNING   POTASSIUM CHLORIDE SA (K-DUR,KLOR-CON) 20 MEQ TABLET    5 (five) times daily. Take one tablet by mouth twice daily for potassium   PREDNISONE (DELTASONE) 5 MG TABLET    Take 1 tablet (5 mg total) by mouth every morning.   ROPINIROLE (REQUIP) 1 MG TABLET    Take 1 mg by mouth at bedtime. Daily at bedtime   ROSUVASTATIN (CRESTOR) 10 MG TABLET    Take 10 mg by mouth daily.     SULFASALAZINE (AZULFIDINE) 500 MG TABLET    Take 1,000 mg by  mouth 2 (two) times daily.    WARFARIN (COUMADIN) 3 MG TABLET    Take 3 mg by mouth daily.   Modified Medications   No medications on file  Discontinued Medications   No medications on file    Subjective: Ms. Herbig is seen on a work in basis today. She is with her 2 daughters. 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 When she completed antibiotics he seemed to be doing better. Her appetite was improving and she was eating more. However recently she his begun to be very fatigued with increasing anorexia and continued weight loss. She has now lost a total of 80 pounds since December. She has had some recent sinus congestion but no shortness of breath, cough, fever, chills or sweats.  Review of Systems: Constitutional: positive for anorexia, fatigue, malaise and weight loss, negative for chills, fevers and sweats Eyes: negative Ears, nose, mouth, throat, and face: positive for nasal congestion, negative for sore mouth and sore throat Respiratory: negative Cardiovascular: positive for irregular heart beat, negative for chest pain, dyspnea and palpitations Gastrointestinal: negative for abdominal pain, constipation, diarrhea, nausea and vomiting Genitourinary:negative Integument/breast: negative Musculoskeletal:positive for chronic, stable low back pain Behavioral/Psych: negative for anxiety and depression  Past Medical History  Diagnosis Date  . Coronary artery disease   . Atrial fibrillation     Now NSR  . GERD (gastroesophageal reflux disease)   . Hyperlipidemia   . 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 Anemia   . Angina   . Shortness of breath     with activity  . Hypertension   . Blood transfusion   . Pneumonia   . H/O hiatal hernia   . Lupus   . Insomnia   . Endocarditis   . Prosthetic valve endocarditis   . Enterococcal bacteremia   . Drug rash      History  Substance Use Topics  . Smoking status: Never Smoker   . Smokeless tobacco: Never Used  . Alcohol Use: No    Family History  Problem Relation Age of Onset  . Heart disease Mother     CHF  . Mental illness Father     suicide  . Cancer Sister     pancreatic cancer  . COPD Brother     Allergies  Allergen Reactions  . Ceftriaxone     RASH BUT TOLERATED AMPICILLIN AND IMIPENEM WITHOUT PROBLEMS  . Codeine Nausea Only    Objective: Temp: 98.2 F (36.8 C) (09/16 1120) Temp src: Oral (09/16 1120) BP: 134/67 mmHg (09/16 1120) Pulse Rate: 81 (09/16 1120)  General: Her weight is down further 251 pounds Skin: Pale skin without any rash. There are no splinter conjunctival hemorrhages Oral: No oropharyngeal lesions Eyes: Normal external exam Neck: Supple Lungs: Clear Cor: Irregular S1 and S2 with a 1/6 systolic murmur Abdomen: Soft and nontender with no palpable masses Joints and extremities: No acute abnormalities Neuro: Alert with normal speech and conversation Mood and affect: Flat  Lab Results Blood cultures 09/10/2012 (2 weeks after completing IV antibiotics): No growth   Assessment: I'm not sure what is causing her anorexia, unintentional weight loss and fatigue. Her daughter states this is how she presented to when she had endocarditis. I will observe her off of antibiotics for now but repeat blood cultures, CBC and complete metabolic panel.  Plan: 1. Observe off of antibiotics 2. Repeat blood cultures x2 3. CBC and complete metabolic panel 4. Followup within one week   Cliffton Asters, MD [facility] for Infectious Disease [facility] [phone] pager   [phone] cell 12/09/2012, 11:45 AM

## 2012-12-11 ENCOUNTER — Telehealth: Payer: Self-pay | Admitting: Infectious Disease

## 2012-12-11 ENCOUNTER — Other Ambulatory Visit: Payer: Self-pay | Admitting: Internal Medicine

## 2012-12-11 ENCOUNTER — Ambulatory Visit (INDEPENDENT_AMBULATORY_CARE_PROVIDER_SITE_OTHER): Payer: Medicare Other | Admitting: Infectious Disease

## 2012-12-11 ENCOUNTER — Ambulatory Visit: Payer: Medicare Other

## 2012-12-11 ENCOUNTER — Encounter: Payer: Self-pay | Admitting: Infectious Disease

## 2012-12-11 VITALS — BP 103/66 | HR 53 | Temp 97.7°F | Ht 66.0 in | Wt 152.6 lb

## 2012-12-11 DIAGNOSIS — I330 Acute and subacute infective endocarditis: Secondary | ICD-10-CM

## 2012-12-11 DIAGNOSIS — G2581 Restless legs syndrome: Secondary | ICD-10-CM

## 2012-12-11 DIAGNOSIS — I4891 Unspecified atrial fibrillation: Secondary | ICD-10-CM

## 2012-12-11 DIAGNOSIS — R64 Cachexia: Secondary | ICD-10-CM

## 2012-12-11 DIAGNOSIS — R7881 Bacteremia: Secondary | ICD-10-CM

## 2012-12-11 DIAGNOSIS — R634 Abnormal weight loss: Secondary | ICD-10-CM

## 2012-12-11 LAB — CBC WITH DIFFERENTIAL/PLATELET
Basophils Relative: 0 % (ref 0–1)
Eosinophils Absolute: 0 10*3/uL (ref 0.0–0.7)
Hemoglobin: 10.9 g/dL — ABNORMAL LOW (ref 12.0–15.0)
Lymphs Abs: 1.6 10*3/uL (ref 0.7–4.0)
MCH: 30.4 pg (ref 26.0–34.0)
MCHC: 34.5 g/dL (ref 30.0–36.0)
Neutro Abs: 5.3 10*3/uL (ref 1.7–7.7)
Neutrophils Relative %: 70 % (ref 43–77)
Platelets: 148 10*3/uL — ABNORMAL LOW (ref 150–400)
RBC: 3.58 MIL/uL — ABNORMAL LOW (ref 3.87–5.11)

## 2012-12-11 LAB — COMPLETE METABOLIC PANEL WITH GFR
ALT: <8 U/L (ref 0–35)
CO2: 24 mEq/L (ref 19–32)
Calcium: 8.9 mg/dL (ref 8.4–10.5)
Chloride: 98 mEq/L (ref 96–112)
Creat: 1.21 mg/dL — ABNORMAL HIGH (ref 0.50–1.10)
GFR, Est African American: 51 mL/min — ABNORMAL LOW
GFR, Est Non African American: 44 mL/min — ABNORMAL LOW
Glucose, Bld: 98 mg/dL (ref 70–99)
Total Bilirubin: 0.8 mg/dL (ref 0.3–1.2)

## 2012-12-11 NOTE — Telephone Encounter (Signed)
Pt with 1/2 positive blood cultures, with GN coccobacilli. She is not doing well. Bring in at 130pm overbook wth low threshld to admit

## 2012-12-11 NOTE — Progress Notes (Signed)
[facility]  HPI  72 y.o. 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 I saw her when she was 2 weeks from last dose of abx.She is without fevers nausea, malaise at that time and repeat blood cultures were negative. In the interval she has been feeling poorly had poor appetite has been losing weight. She came and saw my partner Dr. Orvan Falconer less than a week ago. They had very strong concern for recurrence of endocarditis and Dr. Orvan Falconer ordered repeat blood cultures. Today a phone call from the microbiology lab that one of the 2 blood cultures is growing a gram-negative coccobacilli as in the anaerobic bottle alone. Because the patient continues to feel poorly I brought her to the clinic today and she came in accompanied by her daughter.  Patient continues to suffer from malaise poor appetite fatigue but no overt fevers. She denies an abdominal pain or vomiting or diarrhea.  We checked orthostatic vital signs on her and she was not found to be orthostatic. I propose repeat blood cultures repeat blood work and consideration to sit CT scan of the abdomen and pelvis to rule out an intra-abdominal abscess or infection that might be causing and anaerobic bacteremia. Again it is not clear that the Enterobacter later today is a true pathogen or whether it might represent a contaminant. Given the clinical picture of right ankle he did pursue this and get imaging of the abdomen and repeat blood cultures as described above.    Review of Systems  Constitutional: Positive for activity change, appetite change, fatigue and unexpected weight change. Negative for fever, chills and diaphoresis.  HENT: Negative for congestion, sore throat, rhinorrhea, sneezing, trouble swallowing and sinus pressure.   Eyes: Negative for photophobia and visual  disturbance.  Respiratory: Negative for cough, chest tightness, shortness of breath, wheezing and stridor.   Cardiovascular: Negative for chest pain, palpitations and leg swelling.  Gastrointestinal: Negative for nausea, vomiting, abdominal pain, diarrhea, constipation, blood in stool, abdominal distention and anal bleeding.  Genitourinary: Negative for dysuria, hematuria, flank pain and difficulty urinating.  Musculoskeletal: Negative for myalgias, back pain, joint swelling, arthralgias and gait problem.  Skin: Negative for color change, pallor, rash and wound.  Neurological: Negative for dizziness, tremors, weakness and light-headedness.  Hematological: Negative for adenopathy. Bruises/bleeds easily.  Psychiatric/Behavioral: Positive for dysphoric mood. Negative for behavioral problems, confusion, sleep disturbance, decreased concentration and agitation.     Physical Exam  Constitutional: She is oriented to person, place, and time. No distress.  HENT:  Head: Normocephalic and atraumatic.  Mouth/Throat: Oropharynx is clear and moist.  Eyes: EOM are normal.  Neck: Normal range of motion. Neck supple.  Cardiovascular: Normal rate and regular rhythm.  Exam reveals no gallop and no friction rub.   Murmur heard.  Systolic murmur is present with a grade of 2/6  Pulmonary/Chest: Effort normal and breath sounds normal. No respiratory distress. She has no wheezes. She has no rales. She exhibits no tenderness.  Abdominal: She exhibits no distension and no mass. There is no tenderness. There is no rebound and no guarding.  Musculoskeletal: She exhibits no edema and no tenderness.  Neurological: She is alert and oriented to person, place, and time. She exhibits normal muscle tone. Coordination normal.  Skin: Skin is warm and dry. She is not diaphoretic. No erythema. No pallor.  Psychiatric: She has a normal mood and affect. Her behavior is normal. Judgment and thought content normal.     Assessment/Plan: [name] is a 72 y.o. 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  #1 positive blood culture with gram-negative coccobacillus and anaerobic culture in one of 2 sites: Could be contaminant it could be tube true pathogen as well. We will get repeat blood cultures repeat CMP and CBC with differential and plan on getting a CT of the abdomen and pelvis to work this up along with her malaise and weight loss.  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 Dorris Fetch the pt is NOt an operative candidate. We have not proven recurrence of this on this occasion but as mentioned above are dealing with a gram-negative coccobacillus on culture.   #3 weight loss malaise: see above, get CT abdomen and pelvis

## 2012-12-12 ENCOUNTER — Inpatient Hospital Stay (HOSPITAL_COMMUNITY)
Admission: EM | Admit: 2012-12-12 | Discharge: 2012-12-15 | DRG: 288 | Disposition: A | Discharge status: Home Health | Payer: Medicare Other | Attending: Internal Medicine | Admitting: Internal Medicine

## 2012-12-12 ENCOUNTER — Encounter (HOSPITAL_COMMUNITY): Payer: Self-pay | Admitting: *Deleted

## 2012-12-12 ENCOUNTER — Telehealth: Payer: Self-pay | Admitting: Infectious Disease

## 2012-12-12 DIAGNOSIS — J8410 Pulmonary fibrosis, unspecified: Secondary | ICD-10-CM | POA: Diagnosis present

## 2012-12-12 DIAGNOSIS — B952 Enterococcus as the cause of diseases classified elsewhere: Secondary | ICD-10-CM

## 2012-12-12 DIAGNOSIS — I330 Acute and subacute infective endocarditis: Principal | ICD-10-CM

## 2012-12-12 DIAGNOSIS — M359 Systemic involvement of connective tissue, unspecified: Secondary | ICD-10-CM | POA: Diagnosis present

## 2012-12-12 DIAGNOSIS — Z954 Presence of other heart-valve replacement: Secondary | ICD-10-CM

## 2012-12-12 DIAGNOSIS — I38 Endocarditis, valve unspecified: Secondary | ICD-10-CM

## 2012-12-12 DIAGNOSIS — T826XXS Infection and inflammatory reaction due to cardiac valve prosthesis, sequela: Secondary | ICD-10-CM

## 2012-12-12 DIAGNOSIS — I10 Essential (primary) hypertension: Secondary | ICD-10-CM | POA: Diagnosis present

## 2012-12-12 DIAGNOSIS — E43 Unspecified severe protein-calorie malnutrition: Secondary | ICD-10-CM | POA: Diagnosis present

## 2012-12-12 DIAGNOSIS — I4891 Unspecified atrial fibrillation: Secondary | ICD-10-CM | POA: Diagnosis present

## 2012-12-12 DIAGNOSIS — I252 Old myocardial infarction: Secondary | ICD-10-CM

## 2012-12-12 DIAGNOSIS — K219 Gastro-esophageal reflux disease without esophagitis: Secondary | ICD-10-CM | POA: Diagnosis present

## 2012-12-12 DIAGNOSIS — Z7901 Long term (current) use of anticoagulants: Secondary | ICD-10-CM

## 2012-12-12 DIAGNOSIS — R634 Abnormal weight loss: Secondary | ICD-10-CM

## 2012-12-12 DIAGNOSIS — T826XXD Infection and inflammatory reaction due to cardiac valve prosthesis, subsequent encounter: Secondary | ICD-10-CM

## 2012-12-12 DIAGNOSIS — E785 Hyperlipidemia, unspecified: Secondary | ICD-10-CM

## 2012-12-12 DIAGNOSIS — A5203 Syphilitic endocarditis: Secondary | ICD-10-CM

## 2012-12-12 DIAGNOSIS — D62 Acute posthemorrhagic anemia: Secondary | ICD-10-CM

## 2012-12-12 DIAGNOSIS — I2510 Atherosclerotic heart disease of native coronary artery without angina pectoris: Secondary | ICD-10-CM | POA: Diagnosis present

## 2012-12-12 DIAGNOSIS — Z951 Presence of aortocoronary bypass graft: Secondary | ICD-10-CM

## 2012-12-12 DIAGNOSIS — Z5189 Encounter for other specified aftercare: Secondary | ICD-10-CM

## 2012-12-12 DIAGNOSIS — G2581 Restless legs syndrome: Secondary | ICD-10-CM | POA: Diagnosis present

## 2012-12-12 DIAGNOSIS — M329 Systemic lupus erythematosus, unspecified: Secondary | ICD-10-CM | POA: Diagnosis present

## 2012-12-12 DIAGNOSIS — G4700 Insomnia, unspecified: Secondary | ICD-10-CM | POA: Diagnosis present

## 2012-12-12 DIAGNOSIS — R7881 Bacteremia: Secondary | ICD-10-CM

## 2012-12-12 LAB — COMPREHENSIVE METABOLIC PANEL
ALT: 5 U/L (ref 0–35)
AST: 14 U/L (ref 0–37)
Alkaline Phosphatase: 116 U/L (ref 39–117)
CO2: 23 mEq/L (ref 19–32)
Chloride: 96 mEq/L (ref 96–112)
GFR calc Af Amer: 55 mL/min — ABNORMAL LOW (ref >90)
GFR calc non Af Amer: 47 mL/min — ABNORMAL LOW (ref >90)
Glucose, Bld: 119 mg/dL — ABNORMAL HIGH (ref 70–99)
Sodium: 132 mEq/L — ABNORMAL LOW (ref 135–145)
Total Bilirubin: 0.5 mg/dL (ref 0.3–1.2)

## 2012-12-12 LAB — BASIC METABOLIC PANEL
BUN/Creatinine Ratio: 22 (ref 11–26)
BUN: 24 mg/dL (ref 8–27)
CO2: 20 mmol/L (ref 18–29)
Calcium: 9.2 mg/dL (ref 8.6–10.2)
Chloride: 94 mmol/L — ABNORMAL LOW (ref 97–108)
Creatinine, Ser: 1.11 mg/dL — ABNORMAL HIGH (ref 0.57–1.00)
GFR calc Af Amer: 57 mL/min/{1.73_m2} — ABNORMAL LOW (ref >59)
GFR calc non Af Amer: 49 mL/min/{1.73_m2} — ABNORMAL LOW (ref >59)
Glucose: 101 mg/dL — ABNORMAL HIGH (ref 65–99)
Potassium: 3.9 mmol/L (ref 3.5–5.2)
Sodium: 137 mmol/L (ref 134–144)

## 2012-12-12 LAB — CBC
Hemoglobin: 10.5 g/dL — ABNORMAL LOW (ref 12.0–15.0)
MCH: 30.2 pg (ref 26.0–34.0)
Platelets: 124 10*3/uL — ABNORMAL LOW (ref 150–400)
RBC: 3.48 MIL/uL — ABNORMAL LOW (ref 3.87–5.11)
WBC: 7.3 10*3/uL (ref 4.0–10.5)

## 2012-12-12 LAB — FERRITIN: Ferritin: 411 ng/mL — ABNORMAL HIGH (ref 15–150)

## 2012-12-12 LAB — DIGOXIN LEVEL: Digoxin Level: 1.6 ng/mL (ref 0.9–2.0)

## 2012-12-12 MED ORDER — ATORVASTATIN CALCIUM 20 MG PO TABS
20.0000 mg | ORAL_TABLET | Freq: Every day | ORAL | Status: DC
  Filled 2012-12-12: qty 1

## 2012-12-12 MED ORDER — SODIUM CHLORIDE 0.9 % IV SOLN
500.0000 mg | INTRAVENOUS | Status: AC
  Administered 2012-12-12: 18:00:00 500 mg via INTRAVENOUS
  Filled 2012-12-12: qty 500

## 2012-12-12 MED ORDER — ACETAMINOPHEN 650 MG RE SUPP: 650.0000 mg | Freq: Four times a day (QID) | RECTAL | Status: DC | PRN

## 2012-12-12 MED ORDER — POLYETHYLENE GLYCOL 3350 17 G PO PACK
17.0000 g | PACK | Freq: Every day | ORAL | Status: DC | PRN
  Filled 2012-12-12: qty 1

## 2012-12-12 MED ORDER — ONDANSETRON HCL 4 MG PO TABS: 4.0000 mg | ORAL_TABLET | Freq: Four times a day (QID) | ORAL | Status: DC | PRN

## 2012-12-12 MED ORDER — FUROSEMIDE 40 MG PO TABS
40.0000 mg | ORAL_TABLET | Freq: Every morning | ORAL | Status: DC
  Administered 2012-12-13: 10:00:00 40 mg via ORAL
  Filled 2012-12-12 (×2): qty 1

## 2012-12-12 MED ORDER — PREDNISONE 5 MG PO TABS
5.0000 mg | ORAL_TABLET | Freq: Every morning | ORAL | Status: DC
  Administered 2012-12-13 – 2012-12-15 (×3): 5 mg via ORAL
  Filled 2012-12-12 (×3): qty 1

## 2012-12-12 MED ORDER — SODIUM CHLORIDE 0.9 % IV SOLN
500.0000 mg | INTRAVENOUS | Status: DC
  Filled 2012-12-12: qty 500

## 2012-12-12 MED ORDER — POTASSIUM CHLORIDE CRYS ER 20 MEQ PO TBCR: 40.0000 meq | EXTENDED_RELEASE_TABLET | Freq: Two times a day (BID) | ORAL | Status: DC

## 2012-12-12 MED ORDER — METOLAZONE 2.5 MG PO TABS: 2.5000 mg | ORAL_TABLET | Freq: Every morning | ORAL | Status: DC

## 2012-12-12 MED ORDER — SULFASALAZINE 500 MG PO TABS
1000.0000 mg | ORAL_TABLET | Freq: Two times a day (BID) | ORAL | Status: DC
  Administered 2012-12-12 – 2012-12-15 (×6): 1000 mg via ORAL
  Filled 2012-12-12 (×7): qty 2

## 2012-12-12 MED ORDER — VANCOMYCIN HCL 500 MG IV SOLR
500.0000 mg | Freq: Two times a day (BID) | INTRAVENOUS | Status: DC
  Administered 2012-12-13: 03:00:00 500 mg via INTRAVENOUS
  Filled 2012-12-12 (×2): qty 500

## 2012-12-12 MED ORDER — VANCOMYCIN HCL 500 MG IV SOLR
500.0000 mg | Freq: Two times a day (BID) | INTRAVENOUS | Status: DC
  Filled 2012-12-12: qty 500

## 2012-12-12 MED ORDER — WARFARIN SODIUM 3 MG PO TABS
3.0000 mg | ORAL_TABLET | Freq: Every day | ORAL | Status: DC
  Administered 2012-12-13: 3 mg via ORAL
  Filled 2012-12-12 (×2): qty 1

## 2012-12-12 MED ORDER — ONDANSETRON HCL 4 MG/2ML IJ SOLN: 4.0000 mg | Freq: Four times a day (QID) | INTRAMUSCULAR | Status: DC | PRN

## 2012-12-12 MED ORDER — SODIUM CHLORIDE 0.9 % IJ SOLN: 3.0000 mL | INTRAMUSCULAR | Status: DC | PRN

## 2012-12-12 MED ORDER — METOPROLOL TARTRATE 12.5 MG HALF TABLET
12.5000 mg | ORAL_TABLET | Freq: Two times a day (BID) | ORAL | Status: DC
  Administered 2012-12-13 – 2012-12-15 (×4): 12.5 mg via ORAL
  Filled 2012-12-12 (×7): qty 1

## 2012-12-12 MED ORDER — ALUM & MAG HYDROXIDE-SIMETH 200-200-20 MG/5ML PO SUSP: 30.0000 mL | Freq: Four times a day (QID) | ORAL | Status: DC | PRN

## 2012-12-12 MED ORDER — POTASSIUM CHLORIDE CRYS ER 20 MEQ PO TBCR
40.0000 meq | EXTENDED_RELEASE_TABLET | Freq: Every day | ORAL | Status: DC
  Administered 2012-12-13 – 2012-12-15 (×3): 40 meq via ORAL
  Filled 2012-12-12 (×3): qty 2

## 2012-12-12 MED ORDER — SODIUM CHLORIDE 0.9 % IJ SOLN
3.0000 mL | Freq: Two times a day (BID) | INTRAMUSCULAR | Status: DC
  Administered 2012-12-12 – 2012-12-14 (×2): 3 mL via INTRAVENOUS

## 2012-12-12 MED ORDER — SODIUM CHLORIDE 0.9 % IJ SOLN
3.0000 mL | Freq: Two times a day (BID) | INTRAMUSCULAR | Status: DC
  Administered 2012-12-14: 12:00:00 3 mL via INTRAVENOUS

## 2012-12-12 MED ORDER — SODIUM CHLORIDE 0.9 % IV SOLN
250.0000 mg | Freq: Four times a day (QID) | INTRAVENOUS | Status: DC
  Administered 2012-12-12 – 2012-12-13 (×3): 250 mg via INTRAVENOUS
  Filled 2012-12-12 (×4): qty 250

## 2012-12-12 MED ORDER — ACETAMINOPHEN 325 MG PO TABS: 650.0000 mg | ORAL_TABLET | Freq: Four times a day (QID) | ORAL | Status: DC | PRN

## 2012-12-12 MED ORDER — WARFARIN - PHARMACIST DOSING INPATIENT
Freq: Every day | Status: DC
  Administered 2012-12-12 – 2012-12-13 (×2)

## 2012-12-12 MED ORDER — BIOTENE DRY MOUTH MT LIQD
15.0000 mL | Freq: Two times a day (BID) | OROMUCOSAL | Status: DC
  Administered 2012-12-12 – 2012-12-15 (×6): 15 mL via OROMUCOSAL

## 2012-12-12 MED ORDER — PANTOPRAZOLE SODIUM 40 MG PO TBEC
40.0000 mg | DELAYED_RELEASE_TABLET | Freq: Every morning | ORAL | Status: DC
  Administered 2012-12-13 – 2012-12-15 (×3): 40 mg via ORAL
  Filled 2012-12-12 (×3): qty 1

## 2012-12-12 MED ORDER — VANCOMYCIN HCL 10 G IV SOLR
1750.0000 mg | INTRAVENOUS | Status: AC
  Administered 2012-12-12: 1750 mg via INTRAVENOUS
  Filled 2012-12-12: qty 1750

## 2012-12-12 MED ORDER — ROPINIROLE HCL 1 MG PO TABS
1.0000 mg | ORAL_TABLET | Freq: Every day | ORAL | Status: DC
  Administered 2012-12-12 – 2012-12-14 (×3): 1 mg via ORAL
  Filled 2012-12-12 (×4): qty 1

## 2012-12-12 MED ORDER — INFLUENZA VAC SPLIT QUAD 0.5 ML IM SUSP
0.5000 mL | INTRAMUSCULAR | Status: AC
  Administered 2012-12-13: 0.5 mL via INTRAMUSCULAR
  Filled 2012-12-12: qty 0.5

## 2012-12-12 MED ORDER — SODIUM CHLORIDE 0.9 % IV SOLN: 250.0000 mL | INTRAVENOUS | Status: DC | PRN

## 2012-12-12 MED ORDER — POTASSIUM CHLORIDE CRYS ER 20 MEQ PO TBCR
60.0000 meq | EXTENDED_RELEASE_TABLET | Freq: Every day | ORAL | Status: DC
  Administered 2012-12-12 – 2012-12-14 (×3): 60 meq via ORAL
  Filled 2012-12-12 (×4): qty 3

## 2012-12-12 MED ORDER — SODIUM CHLORIDE 0.9 % IV SOLN
250.0000 mg | Freq: Four times a day (QID) | INTRAVENOUS | Status: DC
  Filled 2012-12-12 (×2): qty 250

## 2012-12-12 MED ORDER — HYDROXYCHLOROQUINE SULFATE 200 MG PO TABS
200.0000 mg | ORAL_TABLET | Freq: Two times a day (BID) | ORAL | Status: DC
  Administered 2012-12-12 – 2012-12-15 (×6): 200 mg via ORAL
  Filled 2012-12-12 (×7): qty 1

## 2012-12-12 MED ORDER — DIGOXIN 125 MCG PO TABS
0.1250 mg | ORAL_TABLET | Freq: Every morning | ORAL | Status: DC
  Administered 2012-12-13 – 2012-12-15 (×3): 0.125 mg via ORAL
  Filled 2012-12-12 (×3): qty 1

## 2012-12-12 MED ORDER — DEXTROSE 5 % IV SOLN: 2.0000 g | Freq: Once | INTRAVENOUS | Status: DC

## 2012-12-12 MED ORDER — POTASSIUM CHLORIDE CRYS ER 20 MEQ PO TBCR
40.0000 meq | EXTENDED_RELEASE_TABLET | Freq: Once | ORAL | Status: AC
  Administered 2012-12-12: 40 meq via ORAL
  Filled 2012-12-12: qty 2

## 2012-12-12 MED ORDER — ZOLPIDEM TARTRATE 5 MG PO TABS
5.0000 mg | ORAL_TABLET | Freq: Every evening | ORAL | Status: DC | PRN
  Administered 2012-12-12 – 2012-12-13 (×2): 5 mg via ORAL
  Filled 2012-12-12 (×2): qty 1

## 2012-12-12 MED ORDER — DIPHENHYDRAMINE HCL 25 MG PO CAPS: 25.0000 mg | ORAL_CAPSULE | Freq: Every day | ORAL | Status: DC

## 2012-12-12 NOTE — Progress Notes (Signed)
ANTIBIOTIC CONSULT NOTE - INITIAL  Pharmacy Consult for Vanco/Primaxin Indication: Enterococcal endocarditis  Allergies  Allergen Reactions  . Ceftriaxone     RASH BUT TOLERATED AMPICILLIN AND IMIPENEM WITHOUT PROBLEMS  . Codeine Nausea Only    Patient Measurements: Height: 5' 7.5" (171.5 cm) Weight: 151 lb 4.8 oz (68.629 kg) IBW/kg (Calculated) : 62.75 Adjusted Body Weight:    Vital Signs: Temp: 99.9 F (37.7 C) (09/19 1303) Temp src: Oral (09/19 1303) BP: 111/51 mmHg (09/19 1303) Pulse Rate: 71 (09/19 1303) Intake/Output from previous day:   Intake/Output from this shift:    Labs:  Recent Labs  12/11/12 0936 12/11/12 1428  WBC  --  7.5  HGB  --  10.9*  PLT  --  148*  CREATININE 1.11* 1.21*   Estimated Creatinine Clearance: 40.4 ml/min (by C-G formula based on Cr of 1.21). No results found for this basename: VANCOTROUGH, VANCOPEAK, VANCORANDOM, GENTTROUGH, GENTPEAK, GENTRANDOM, TOBRATROUGH, TOBRAPEAK, TOBRARND, AMIKACINPEAK, AMIKACINTROU, AMIKACIN,  in the last 72 hours   Microbiology: Recent Results (from the past 720 hour(s))  CULTURE, BLOOD (SINGLE)     Status: None   Collection Time    12/09/12 11:40 AM      Result Value Range Status   Preliminary Report GRAM POSITIVE ORGANISM   Preliminary   Comment: Gram Stain Report Called to,Read Back By     and Verified With:     DR VAN DAM 1115 12/12/12 BY KRAWS  CULTURE, BLOOD (SINGLE)     Status: None   Collection Time    12/09/12 11:40 AM      Result Value Range Status   Preliminary Report Gram Negative Coccobacilli   Preliminary  CULTURE, BLOOD (SINGLE)     Status: None   Collection Time    12/11/12  2:28 PM      Result Value Range Status   Preliminary Report Blood Culture received; No Growth to date;   Preliminary   Preliminary Report Culture will be held for 5 days before issuing   Preliminary   Preliminary Report a Final Negative report.   Preliminary    Medical History: Past Medical History   Diagnosis Date  . Coronary artery disease   . Atrial fibrillation     Now NSR  . GERD (gastroesophageal reflux disease)   . Hyperlipidemia   . 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 Anemia   . Angina   . Shortness of breath     with activity  . Hypertension   . Blood transfusion   . Pneumonia   . H/O hiatal hernia   . Lupus   . Insomnia   . Endocarditis   . Prosthetic valve endocarditis   . Enterococcal bacteremia   . 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 Patient continues to feel poorly, is losing weight, and has a loss of appetite. Blood cultures growing GNC x 2  Afebrile. WBC 7.3. Scr 1.21 with estimated CrCl 40  Goal of Therapy:  Vancomycin trough level 15-20 mcg/ml  Plan:  Vanco (25mg /kg) load 1750mg  x 1 and Primaxin 500mg  IV x 1 Vancomycin 500mg  IV q12 hrs. Trough at steady state. Primaxin 250mg  IV q6 hrs.   [name] S. Merilynn Finland, PharmD, BCPS Clinical Staff Pharmacist Pager [phone]  Misty Stanley Stillinger 12/12/2012,2:22 PM

## 2012-12-12 NOTE — Telephone Encounter (Signed)
Pt with true blood stream infection 2/2 positive blood cultures  She needs admission for IV abx  Daughter asked if she could be triaged in [facility] rather than ED.   I am not sure that will

## 2012-12-12 NOTE — H&P (Signed)
Addendum  Patient seen and examined, chart and data base reviewed.  I agree with the above assessment and plan.  For full details please see Mrs. Algis Downs PA note.  History of endocarditis x2, aortic bioprosthetic valve.  Came in with blood culture positive for both gram-positive cocci and gram-negative coccobacilli.   Clint Lipps, MD [facility] Pager: [phone] 12/12/2012, 6:00 PM

## 2012-12-12 NOTE — ED Provider Notes (Signed)
CSN: [id number]     Arrival date & time 12/12/12  1257 History   First MD Initiated Contact with Patient 12/12/12 1345     Chief Complaint  Patient presents with  . Weakness  . abnormal labs    (Consider location/radiation/quality/duration/timing/severity/associated sxs/prior Treatment) Patient is a 72 y.o. female presenting with weakness.  Weakness   comPlains of generalized weakness for one week. Had blood cultures performed which were consistent with gram-positive cocci likely strep per Dr. Daiva [name]. Patient is presently asymptomatic except for generalized weakness. Has been treated with intravenous antibiotics for presumed endocarditis. Presents today for admission and repeat treatment with antibiotics. Per conversatio 4 crit n with Dr.VAn [name] patient requires intravenous imipenem and vancomycin. Nothing makes symptoms better or worse. No other associated symptoms. Past Medical History  Diagnosis Date  . Coronary artery disease   . Atrial fibrillation     Now NSR  . GERD (gastroesophageal reflux disease)   . Hyperlipidemia   . 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 Anemia   . Angina   . Shortness of breath     with activity  . Hypertension   . Blood transfusion   . Pneumonia   . H/O hiatal hernia   . Lupus   . Insomnia   . Endocarditis   . Prosthetic valve endocarditis   . Enterococcal bacteremia   . Drug rash    Past Surgical History  Procedure Laterality Date  . Cardiac catheterization    . Cardiac valve replacement      2006  . Coronary artery bypass graft    . Givens capsule study  04/09/2011    Procedure: GIVENS CAPSULE STUDY;  Surgeon: Theda Belfast, MD;  Location: [facility] ENDOSCOPY;  Service: Endoscopy;  Laterality: N/A;  . Tee without cardioversion  02/06/2012    Procedure: TRANSESOPHAGEAL ECHOCARDIOGRAM (TEE);  Surgeon: Ricki Rodriguez, MD;  Location: [facility] ENDOSCOPY;  Service: Cardiovascular;  Laterality: N/A;  . Esophagogastroduodenoscopy   02/12/2012    Procedure: ESOPHAGOGASTRODUODENOSCOPY (EGD);  Surgeon: Theda Belfast, MD;  Location: [facility] ENDOSCOPY;  Service: Endoscopy;  Laterality: N/A;  . Tee without cardioversion N/A 07/15/2012    Procedure: TRANSESOPHAGEAL ECHOCARDIOGRAM (TEE);  Surgeon: Ricki Rodriguez, MD;  Location: [facility] ENDOSCOPY;  Service: Cardiovascular;  Laterality: N/A;  . Breast lumpectomy  1977   Family History  Problem Relation Age of Onset  . Heart disease Mother     CHF  . Mental illness Father     suicide  . Cancer Sister     pancreatic cancer  . COPD Brother    History  Substance Use Topics  . Smoking status: Never Smoker   . Smokeless tobacco: Never Used  . Alcohol Use: No   OB History   Grav Para Term Preterm Abortions TAB SAB Ect Mult Living                 Review of Systems  HENT: Negative.   Respiratory: Negative.   Cardiovascular: Negative.   Gastrointestinal: Negative.   Musculoskeletal: Negative.   Skin: Negative.   Neurological: Positive for weakness.  Psychiatric/Behavioral: Negative.   All other systems reviewed and are negative.    Allergies  Ceftriaxone and Codeine  Home Medications   Current Outpatient Rx  Name  Route  Sig  Dispense  Refill  . digoxin (LANOXIN) 0.125 MG tablet   Oral   Take 0.125 mg by mouth every morning.         Marland Kitchen  furosemide (LASIX) 40 MG tablet   Oral   Take 40 mg by mouth every morning.         . hydroxychloroquine (PLAQUENIL) 200 MG tablet   Oral   Take 200 mg by mouth 2 (two) times daily.          . metolazone (ZAROXOLYN) 2.5 MG tablet   Oral   Take 2.5 mg by mouth every morning.         . metoprolol (LOPRESSOR) 50 MG tablet   Oral   Take 0.5 tablets (25 mg total) by mouth 2 (two) times daily.   60 tablet   3   . pantoprazole (PROTONIX) 40 MG tablet   Oral   Take 40 mg by mouth every morning.         . potassium chloride SA (K-DUR,KLOR-CON) 20 MEQ tablet   Oral   Take 40-60 mEq by mouth 2 (two) times daily. Takes 2  tablets every morning and takes 3 tablets every evening.         . predniSONE (DELTASONE) 5 MG tablet   Oral   Take 1 tablet (5 mg total) by mouth every morning.   30 tablet   2   . rOPINIRole (REQUIP) 1 MG tablet   Oral   Take 1 mg by mouth at bedtime.          . rosuvastatin (CRESTOR) 10 MG tablet   Oral   Take 10 mg by mouth every morning.          . sulfaSALAzine (AZULFIDINE) 500 MG tablet   Oral   Take 1,000 mg by mouth 2 (two) times daily.          Marland Kitchen warfarin (COUMADIN) 3 MG tablet   Oral   Take 3 mg by mouth every morning.          BP 111/51  Pulse 71  Temp(Src) 99.9 F (37.7 C) (Oral)  Resp 18  Ht 5' 7.5" (1.715 m)  Wt 151 lb 4.8 oz (68.629 kg)  BMI 23.33 kg/m2  SpO2 96% Physical Exam  Nursing note and vitals reviewed. Constitutional: She appears well-developed and well-nourished.  HENT:  Head: Normocephalic and atraumatic.  Eyes: Conjunctivae are normal. Pupils are equal, round, and reactive to light.  Neck: Neck supple. No tracheal deviation present. No thyromegaly present.  Cardiovascular: Normal rate and regular rhythm.   Murmur heard. Irregularly irregular 2/6 systolic murmur  Pulmonary/Chest: Effort normal and breath sounds normal.  Abdominal: Soft. Bowel sounds are normal. She exhibits no distension. There is no tenderness.  Musculoskeletal: Normal range of motion. She exhibits no edema and no tenderness.  Neurological: She is alert. Coordination normal.  Skin: Skin is warm and dry. No rash noted.  Psychiatric: She has a normal mood and affect.   Date: 12/12/2012  Rate: 70  Rhythm: atrial fibrillation  QRS Axis: normal  Intervals: normal  ST/T Wave abnormalities: nonspecific ST/T changes  Conduction Disutrbances:none  Narrative Interpretation:   Old EKG Reviewed: No significant change from 07/11/2012 interpreted by me  3:30 PM patient resting comfortably. No distress.    ED Course  Procedures (including critical care time) Labs  Review Labs Reviewed - No data to display Imaging Review No results found. Results for orders placed during the hospital encounter of 12/12/12  CBC      Result Value Range   WBC 7.3  4.0 - 10.5 K/uL   RBC 3.48 (*) 3.87 - 5.11 MIL/uL   Hemoglobin 10.5 (*)  12.0 - 15.0 g/dL   HCT 40.9 (*) 81.1 - 91.4 %   MCV 89.7  78.0 - 100.0 fL   MCH 30.2  26.0 - 34.0 pg   MCHC 33.7  30.0 - 36.0 g/dL   RDW 78.2  95.6 - 21.3 %   Platelets 124 (*) 150 - 400 K/uL  COMPREHENSIVE METABOLIC PANEL      Result Value Range   Sodium 132 (*) 135 - 145 mEq/L   Potassium 3.4 (*) 3.5 - 5.1 mEq/L   Chloride 96  96 - 112 mEq/L   CO2 23  19 - 32 mEq/L   Glucose, Bld 119 (*) 70 - 99 mg/dL   BUN 31 (*) 6 - 23 mg/dL   Creatinine, Ser 0.86 (*) 0.50 - 1.10 mg/dL   Calcium 9.0  8.4 - 57.8 mg/dL   Total Protein 7.5  6.0 - 8.3 g/dL   Albumin 3.1 (*) 3.5 - 5.2 g/dL   AST 14  0 - 37 U/L   ALT 5  0 - 35 U/L   Alkaline Phosphatase 116  39 - 117 U/L   Total Bilirubin 0.5  0.3 - 1.2 mg/dL   GFR calc non Af Amer 47 (*) >90 mL/min   GFR calc Af Amer 55 (*) >90 mL/min  PROTIME-INR      Result Value Range   Prothrombin Time 22.5 (*) 11.6 - 15.2 seconds   INR 2.05 (*) 0.00 - 1.49   No results found.   MDM  No diagnosis found. [place] w/ Ms York , plan inpt stay iv antibiotics . Pt shoud have blood cultures done in 24 hours per dr vandam to ensure clearing of bacteriemia Diagnosis #1bacteremia #2 anemia #3hyperglycemia #4 mild hypoklaemia     Doug Sou, MD 12/12/12 1535

## 2012-12-12 NOTE — H&P (Signed)
[facility] History and Physical  [name] ZOX:[id number] DOB: 05/12/1940 DOA: 12/12/2012  Referring physician: Dr. Ethelda Chick PCP: [name], MD  Specialists: Dr. Daiva Eves of ID  Chief Complaint: Fatigue, weakness  HPI: [name] is a 72 y.o. female with a history of 2 previous episodes of endocarditis, prosthetic mitral valve, Lupus, Pulmonary fibrosis, Afib, NSTEMI, HLD and CAD.  She was taken to [facility] yesterday by her daughter who noticing that the patient was becoming increasingly fatigued over the past week.  Her appetite has been poor.  The daughter comments that the 2 previous episodes of endocarditis started this way.  Otherwise, the patient has no complaints.  She feels generally well.  She complains of chronic insomnia - but no fever, nausea, changes in bowel habits, dysuria, chest pain, palpitations, or shortness of breath.   Review of Systems: denies lymphadenopathy, night sweats, cuts, scrapes, cold symptoms, sore throat.  All other systems are negative.  Past Medical History  Diagnosis Date  . Coronary artery disease   . Atrial fibrillation     Now NSR  . GERD (gastroesophageal reflux disease)   . Hyperlipidemia   . 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 Anemia   . Angina   . Shortness of breath     with activity  . Hypertension   . Blood transfusion   . Pneumonia   . H/O hiatal hernia   . Lupus   . Insomnia   . Endocarditis   . Prosthetic valve endocarditis   . Enterococcal bacteremia   . Drug rash    Past Surgical History  Procedure Laterality Date  . Cardiac catheterization    . Cardiac valve replacement      2006  . Coronary artery bypass graft    . Givens capsule study  04/09/2011    Procedure: GIVENS CAPSULE STUDY;  Surgeon: Theda Belfast, MD;  Location: [facility] ENDOSCOPY;  Service: Endoscopy;  Laterality: N/A;  . Tee without cardioversion  02/06/2012    Procedure: TRANSESOPHAGEAL ECHOCARDIOGRAM (TEE);   Surgeon: Ricki Rodriguez, MD;  Location: [facility] ENDOSCOPY;  Service: Cardiovascular;  Laterality: N/A;  . Esophagogastroduodenoscopy  02/12/2012    Procedure: ESOPHAGOGASTRODUODENOSCOPY (EGD);  Surgeon: Theda Belfast, MD;  Location: [facility] ENDOSCOPY;  Service: Endoscopy;  Laterality: N/A;  . Tee without cardioversion N/A 07/15/2012    Procedure: TRANSESOPHAGEAL ECHOCARDIOGRAM (TEE);  Surgeon: Ricki Rodriguez, MD;  Location: [facility] ENDOSCOPY;  Service: Cardiovascular;  Laterality: N/A;  . Breast lumpectomy  1977   Social History:  reports that she has never smoked. She has never used smokeless tobacco. She reports that she does not drink alcohol. Her drug history is not on file. Lives with Daughter who is at bedside.  She is independent with her ADLs  Allergies  Allergen Reactions  . Ceftriaxone     RASH BUT TOLERATED AMPICILLIN AND IMIPENEM WITHOUT PROBLEMS  . Codeine Nausea Only    Family History  Problem Relation Age of Onset  . Heart disease Mother     CHF  . Mental illness Father     suicide  . Cancer Sister     pancreatic cancer  . COPD Brother     Prior to Admission medications   Medication Sig Start Date End Date Taking? Authorizing Provider  digoxin (LANOXIN) 0.125 MG tablet Take 0.125 mg by mouth every morning.   Yes Historical Provider, MD  furosemide (LASIX) 40 MG tablet Take 40 mg by  mouth every morning.   Yes Historical Provider, MD  hydroxychloroquine (PLAQUENIL) 200 MG tablet Take 200 mg by mouth 2 (two) times daily.    Yes Historical Provider, MD  metolazone (ZAROXOLYN) 2.5 MG tablet Take 2.5 mg by mouth every morning.   Yes Historical Provider, MD  metoprolol (LOPRESSOR) 50 MG tablet Take 0.5 tablets (25 mg total) by mouth 2 (two) times daily. 07/22/12  Yes [name], MD  pantoprazole (PROTONIX) 40 MG tablet Take 40 mg by mouth every morning.   Yes Historical Provider, MD  potassium chloride SA (K-DUR,KLOR-CON) 20 MEQ tablet Take 40-60 mEq by mouth 2 (two) times daily.  Takes 2 tablets every morning and takes 3 tablets every evening.   Yes Historical Provider, MD  predniSONE (DELTASONE) 5 MG tablet Take 1 tablet (5 mg total) by mouth every morning. 08/07/12  Yes Storm Frisk, MD  rOPINIRole (REQUIP) 1 MG tablet Take 1 mg by mouth at bedtime.    Yes Historical Provider, MD  rosuvastatin (CRESTOR) 10 MG tablet Take 10 mg by mouth every morning.    Yes Historical Provider, MD  sulfaSALAzine (AZULFIDINE) 500 MG tablet Take 1,000 mg by mouth 2 (two) times daily.    Yes Historical Provider, MD  warfarin (COUMADIN) 3 MG tablet Take 3 mg by mouth every morning.   Yes Historical Provider, MD   Physical Exam: Filed Vitals:   12/12/12 1555  BP:   Pulse:   Temp: 98.2 F (36.8 C)  Resp:      General:  Wd, Wn, elderly female, NAD, well appearing, Pleasant  Eyes: PERRL, Sclera clear  ENT: no erythema or exudates, mmm  Neck: no lymphadenopathy, supple  Cardiovascular: irreg irreg, bradycardic  Respiratory: cta no w/c/r  Abdomen: soft nt, nd,+BS no masses  Skin: no rash, bruise, lesions  Musculoskeletal: able to move all 4 extremities  Psychiatric: A&O, NAD, Cooperative, Appropriate  Neurologic: Non focal   Labs on Admission:  Basic Metabolic Panel:  Recent Labs Lab 12/09/12 1146 12/11/12 0936 12/11/12 1428 12/12/12 1357  NA 137 137 134* 132*  K 3.6 3.9 3.7 3.4*  CL 99 94* 98 96  CO2 26 20 24 23   GLUCOSE 113* 101* 98 119*  BUN 26* 24 27* 31*  CREATININE 1.21* 1.11* 1.21* 1.12*  CALCIUM 9.2 9.2 8.9 9.0   Liver Function Tests:  Recent Labs Lab 12/09/12 1146 12/11/12 1428 12/12/12 1357  AST 16 13 14   ALT <8 <8 5  ALKPHOS 109 98 116  BILITOT 0.6 0.8 0.5  PROT 7.5 7.2 7.5  ALBUMIN 3.5 3.3* 3.1*  CBC:  Recent Labs Lab 12/09/12 1146 12/11/12 1428 12/12/12 1357  WBC 7.7 7.5 7.3  NEUTROABS  --  5.3  --   HGB 11.6* 10.9* 10.5*  HCT 33.9* 31.6* 31.2*  MCV 90.4 88.3 89.7  PLT 167 148* 124*    BNP (last 3  results)  Recent Labs  07/11/12 1901 07/14/12 1651  PROBNP 12830.0* 5791.0*    EKG: afib  Assessment/Plan Active Problems:   RESTLESS LEG SYNDROME, MILD   CORONARY ARTERY DISEASE   Atrial fibrillation   Postinflammatory pulmonary fibrosis   GERD   MIXED CONNECTIVE TISSUE DISEASE   Bacteremia   Bacteremia with probable endocarditis Blood cultures from 9/16:  1 culture is gram +, 1 culture is gram - Repeat blood cultures were drawn in [facility] on 9/18 Dr. Daiva Eves requests the patient be admitted and started on Vanc and Imipenem. 1st episode of endocarditis was  in 12/2011, second episode was 06/2012. Has a prosthetic mitral valve.  Lupus with pulmonary fibrosis On immunosuppressive medications Appears stable  Afib  Chronic coumadin Digoxin  Rate controlled (brady in ED) Will 1/2 dose of BB as patient is currently bradycardic   CAD  Hx of NSTEMI, CABG  GERD Protonix  HTN Well controlled. Patient on significant diuretics (lasix and zaroxolyn in addition to BB)  Restless leg Requip.     Code Status: full code.  But wants only "reasonable" measures taken.  (Needs further discussion) Family Communication: daughter at bedside Disposition Plan: inpatient.  Time spent: 60 min  Conley Canal [facility] Pager [phone]  If 7PM-7AM, please contact night-coverage [email or web address] Password [facility] 12/12/2012, 4:31 PM

## 2012-12-12 NOTE — ED Notes (Signed)
Pt went to pcp on Tues b/c she felt weak.  Cultures were drawn and resulted today.  Dr Algis Liming stated to come to ED immediately d/t "blood stream" infection.  Hx of heart valve replacement and endocarditis.  Denies any pain.  C/o sob on ambulation, but this is her norm.

## 2012-12-12 NOTE — Progress Notes (Signed)
Patient b/p 104/57- NP told RN hold lopressor.

## 2012-12-12 NOTE — Progress Notes (Signed)
ANTICOAGULATION CONSULT NOTE - Initial Consult  Pharmacy Consult for Coumadin Indication: atrial fibrillation  Allergies  Allergen Reactions  . Ceftriaxone     RASH BUT TOLERATED AMPICILLIN AND IMIPENEM WITHOUT PROBLEMS  . Codeine Nausea Only    Patient Measurements: Height: 5' 7.5" (171.5 cm) Weight: 151 lb 4.8 oz (68.629 kg) IBW/kg (Calculated) : 62.75 Heparin Dosing Weight: n/a  Vital Signs: Temp: 98.2 F (36.8 C) (09/19 1555) Temp src: Oral (09/19 1423) BP: 107/53 mmHg (09/19 1423) Pulse Rate: 63 (09/19 1423)  Labs:  Recent Labs  12/11/12 0936 12/11/12 1428 12/12/12 1357 12/12/12 1409  HGB  --  10.9* 10.5*  --   HCT  --  31.6* 31.2*  --   PLT  --  148* 124*  --   LABPROT  --   --   --  22.5*  INR  --   --   --  2.05*  CREATININE 1.11* 1.21* 1.12*  --     Estimated Creatinine Clearance: 43.7 ml/min (by C-G formula based on Cr of 1.12).   Medical History: Past Medical History  Diagnosis Date  . Coronary artery disease   . Atrial fibrillation     Now NSR  . GERD (gastroesophageal reflux disease)   . Hyperlipidemia   . 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 Anemia   . Angina   . Shortness of breath     with activity  . Hypertension   . Blood transfusion   . Pneumonia   . H/O hiatal hernia   . Lupus   . Insomnia   . Endocarditis   . Prosthetic valve endocarditis   . Enterococcal bacteremia   . Drug rash     Medications:  Scheduled:  . antiseptic oral rinse  15 mL Mouth Rinse BID  . [START ON 12/13/2012] atorvastatin  20 mg Oral q1800  . [START ON 12/13/2012] digoxin  0.125 mg Oral q morning - 10a  . [START ON 12/13/2012] furosemide  40 mg Oral q morning - 10a  . hydroxychloroquine  200 mg Oral BID  . [START ON 12/13/2012] imipenem-cilastatin  250 mg Intravenous Q6H  . [START ON 12/13/2012] influenza vac split quadrivalent PF  0.5 mL Intramuscular Tomorrow-1000  . [START ON 12/13/2012] metolazone  2.5 mg Oral q morning - 10a   . metoprolol tartrate  12.5 mg Oral BID  . [START ON 12/13/2012] pantoprazole  40 mg Oral q morning - 10a  . [START ON 12/13/2012] potassium chloride  40 mEq Oral Daily  . potassium chloride  60 mEq Oral QHS  . [START ON 12/13/2012] predniSONE  5 mg Oral q morning - 10a  . rOPINIRole  1 mg Oral QHS  . sodium chloride  3 mL Intravenous Q12H  . sodium chloride  3 mL Intravenous Q12H  . sulfaSALAzine  1,000 mg Oral BID  . [START ON 12/13/2012] vancomycin  500 mg Intravenous Q12H    Assessment: 72 yo female on chronic Coumadin for afib admitted for possible recurrence of endocarditis.  INR therapeutic today.  Per patient and daughter, she takes Coumadin 3 mg daily at home in the mornings, and has already had her dose today. No bleeding or complications noted.  Goal of Therapy:  INR 2-3 Monitor platelets by anticoagulation protocol: Yes   Plan:  1. Continue Coumadin 3 mg daily (will move to q 1800 per hospital policy). 2. Daily PT/INR.  Tad Moore, BCPS  Clinical Pharmacist Pager [phone]  12/12/2012 5:16 PM

## 2012-12-12 NOTE — Progress Notes (Addendum)
[facility]    Subjective: No new complaints   Antibiotics:  Anti-infectives   Start     Dose/Rate Route Frequency Ordered Stop   12/13/12 0400  vancomycin (VANCOCIN) 500 mg in sodium chloride 0.9 % 100 mL IVPB  Status:  Discontinued     500 mg 100 mL/hr over 60 Minutes Intravenous Every 12 hours 12/12/12 1447 12/12/12 1635   12/13/12 0400  vancomycin (VANCOCIN) 500 mg in sodium chloride 0.9 % 100 mL IVPB     500 mg 100 mL/hr over 60 Minutes Intravenous Every 12 hours 12/12/12 1702     12/13/12 0000  imipenem-cilastatin (PRIMAXIN) 250 mg in sodium chloride 0.9 % 100 mL IVPB  Status:  Discontinued     250 mg 200 mL/hr over 30 Minutes Intravenous 4 times per day 12/12/12 1447 12/12/12 1635   12/13/12 0000  imipenem-cilastatin (PRIMAXIN) 250 mg in sodium chloride 0.9 % 100 mL IVPB     250 mg 200 mL/hr over 30 Minutes Intravenous 4 times per day 12/12/12 1703     12/12/12 2200  hydroxychloroquine (PLAQUENIL) tablet 200 mg     200 mg Oral 2 times daily 12/12/12 1634     12/12/12 1800  imipenem-cilastatin (PRIMAXIN) 500 mg in sodium chloride 0.9 % 100 mL IVPB     500 mg 200 mL/hr over 30 Minutes Intravenous STAT 12/12/12 1749 12/12/12 1836   12/12/12 1430  vancomycin (VANCOCIN) 1,750 mg in sodium chloride 0.9 % 500 mL IVPB     1,750 mg 250 mL/hr over 120 Minutes Intravenous STAT 12/12/12 1427 12/12/12 1706   12/12/12 1430  imipenem-cilastatin (PRIMAXIN) 500 mg in sodium chloride 0.9 % 100 mL IVPB  Status:  Discontinued     500 mg 200 mL/hr over 30 Minutes Intravenous STAT 12/12/12 1427 12/12/12 1836   12/12/12 1415  cefTRIAXone (ROCEPHIN) 2 g in dextrose 5 % 50 mL IVPB  Status:  Discontinued     2 g 100 mL/hr over 30 Minutes Intravenous  Once 12/12/12 1408 12/12/12 1430      Medications: Scheduled Meds: . antiseptic oral rinse  15 mL Mouth Rinse BID  . [START ON 12/13/2012] atorvastatin  20 mg Oral q1800  . [START ON 12/13/2012] digoxin  0.125 mg  Oral q morning - 10a  . [START ON 12/13/2012] furosemide  40 mg Oral q morning - 10a  . hydroxychloroquine  200 mg Oral BID  . [START ON 12/13/2012] imipenem-cilastatin  250 mg Intravenous Q6H  . [START ON 12/13/2012] influenza vac split quadrivalent PF  0.5 mL Intramuscular Tomorrow-1000  . [START ON 12/13/2012] metolazone  2.5 mg Oral q morning - 10a  . metoprolol tartrate  12.5 mg Oral BID  . [START ON 12/13/2012] pantoprazole  40 mg Oral q morning - 10a  . [START ON 12/13/2012] potassium chloride  40 mEq Oral Daily  . potassium chloride  60 mEq Oral QHS  . [START ON 12/13/2012] predniSONE  5 mg Oral q morning - 10a  . rOPINIRole  1 mg Oral QHS  . sodium chloride  3 mL Intravenous Q12H  . sodium chloride  3 mL Intravenous Q12H  . sulfaSALAzine  1,000 mg Oral BID  . [START ON 12/13/2012] vancomycin  500 mg Intravenous Q12H  . [START ON 12/13/2012] warfarin  3 mg Oral q1800  . Warfarin - Pharmacist Dosing Inpatient   Does not apply q1800   Continuous Infusions:  PRN Meds:.sodium chloride, acetaminophen, acetaminophen, alum & mag hydroxide-simeth,  ondansetron (ZOFRAN) IV, ondansetron, polyethylene glycol, sodium chloride, zolpidem   Objective: Weight change:   Intake/Output Summary (Last 24 hours) at 12/12/12 2128 Last data filed at 12/12/12 2000  Gross per 24 hour  Intake    100 ml  Output      1 ml  Net     99 ml   Blood pressure 104/57, pulse 65, temperature 98.3 F (36.8 C), temperature source Oral, resp. rate 18, height 5' 7.5" (1.715 m), weight 152 lb 11.2 oz (69.264 kg), SpO2 90.00%. Temp:  [98.2 F (36.8 C)-99.9 F (37.7 C)] 98.3 F (36.8 C) (09/19 2049) Pulse Rate:  [51-71] 65 (09/19 2049) Resp:  [16-18] 18 (09/19 2049) BP: (104-119)/(51-57) 104/57 mmHg (09/19 2049) SpO2:  [90 %-96 %] 90 % (09/19 2049) Weight:  [151 lb 4.8 oz (68.629 kg)-152 lb 11.2 oz (69.264 kg)] 152 lb 11.2 oz (69.264 kg) (09/19 1714)  Physical Exam: General: Alert and awake, oriented x3, not in any  acute distress. HEENT: anicteric sclera, EOMI CVS IRR IRR rate, normal r,  no murmur rubs or gallops Chest: clear to auscultation bilaterally, no wheezing, rales or rhonchi Abdomen: soft nontender, nondistended, normal bowel sounds, Neuro: nonfocal  Lab Results:  Recent Labs  12/11/12 1428 12/12/12 1357  WBC 7.5 7.3  HGB 10.9* 10.5*  HCT 31.6* 31.2*  PLT 148* 124*    BMET  Recent Labs  12/11/12 1428 12/12/12 1357  NA 134* 132*  K 3.7 3.4*  CL 98 96  CO2 24 23  GLUCOSE 98 119*  BUN 27* 31*  CREATININE 1.21* 1.12*  CALCIUM 8.9 9.0    Micro Results: Recent Results (from the past 240 hour(s))  CULTURE, BLOOD (SINGLE)     Status: None   Collection Time    12/09/12 11:40 AM      Result Value Range Status   Preliminary Report GRAM POSITIVE ORGANISM   Preliminary   Comment: Gram Stain Report Called to,Read Back By     and Verified With:     DR VAN DAM 1115 12/12/12 BY KRAWS  CULTURE, BLOOD (SINGLE)     Status: None   Collection Time    12/09/12 11:40 AM      Result Value Range Status   Preliminary Report Gram Negative Coccobacilli   Preliminary  CULTURE, BLOOD (SINGLE)     Status: None   Collection Time    12/11/12  2:28 PM      Result Value Range Status   Preliminary Report Blood Culture received; No Growth to date;   Preliminary   Preliminary Report Culture will be held for 5 days before issuing   Preliminary   Preliminary Report a Final Negative report.   Preliminary    Studies/Results: No results found.    Assessment/Plan: [name] is a 72 y.o. 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 Sharyn Lull)         LOS: 0 days   Paulette Blanch Dam 12/12/2012, 9:28 PM

## 2012-12-12 NOTE — Progress Notes (Signed)
Admission note:   Arrival Method: Via stretcher from ED at 1640 Mental Status: A&Ox4 Telemetry: Placed on box #6  Skin: Intact.  Tubes: N/A IV: RAC 9/19 Currently receiving IV Vanc. Pain: 0/10  Family: Daughter at bedside. Living Situation: Usually lives home alone, but has been staying with her daughter while she hasn't been feeling well.  Safety Measures: Call bell at bedside. Daughter currently at bedside, but informed her that we would be turning on the bed alarm when/if daughter left.  5W Orientation: Oriented to unit and surroundings. Patient info guide given to patient.

## 2012-12-13 DIAGNOSIS — I38 Endocarditis, valve unspecified: Secondary | ICD-10-CM

## 2012-12-13 DIAGNOSIS — E43 Unspecified severe protein-calorie malnutrition: Secondary | ICD-10-CM | POA: Insufficient documentation

## 2012-12-13 DIAGNOSIS — I4891 Unspecified atrial fibrillation: Secondary | ICD-10-CM

## 2012-12-13 LAB — BASIC METABOLIC PANEL
BUN: 27 mg/dL — ABNORMAL HIGH (ref 6–23)
CO2: 26 mEq/L (ref 19–32)
Calcium: 8.6 mg/dL (ref 8.4–10.5)
Chloride: 102 mEq/L (ref 96–112)
Creatinine, Ser: 1.04 mg/dL (ref 0.50–1.10)
GFR calc Af Amer: 60 mL/min — ABNORMAL LOW (ref >90)

## 2012-12-13 LAB — CBC
HCT: 28.6 % — ABNORMAL LOW (ref 36.0–46.0)
MCHC: 33.9 g/dL (ref 30.0–36.0)
MCV: 89.7 fL (ref 78.0–100.0)
Platelets: 121 10*3/uL — ABNORMAL LOW (ref 150–400)
RDW: 13.9 % (ref 11.5–15.5)
WBC: 5.7 10*3/uL (ref 4.0–10.5)

## 2012-12-13 LAB — PROTIME-INR
INR: 1.89 — ABNORMAL HIGH (ref 0.00–1.49)
Prothrombin Time: 21.1 seconds — ABNORMAL HIGH (ref 11.6–15.2)

## 2012-12-13 LAB — CK: Total CK: 33 U/L (ref 7–177)

## 2012-12-13 MED ORDER — ENSURE PUDDING PO PUDG
1.0000 | Freq: Three times a day (TID) | ORAL | Status: DC
  Administered 2012-12-14: 11:00:00 1 via ORAL

## 2012-12-13 MED ORDER — SODIUM CHLORIDE 0.9 % IV SOLN
700.0000 mg | INTRAVENOUS | Status: DC
  Administered 2012-12-13 – 2012-12-14 (×2): 700 mg via INTRAVENOUS
  Filled 2012-12-13 (×4): qty 14

## 2012-12-13 MED ORDER — SODIUM CHLORIDE 0.9 % IV SOLN
2.0000 g | Freq: Four times a day (QID) | INTRAVENOUS | Status: DC
  Administered 2012-12-13 – 2012-12-14 (×3): 2 g via INTRAVENOUS
  Filled 2012-12-13 (×7): qty 2000

## 2012-12-13 NOTE — Progress Notes (Signed)
INITIAL NUTRITION ASSESSMENT  DOCUMENTATION CODES Per approved criteria  -Severe malnutrition in the context of chronic illness   INTERVENTION: Ensure Pudding PO TID, each supplement provides 170 kcal and 4 grams of protein.   NUTRITION DIAGNOSIS: Malnutrition related to inadequate oral intake as evidenced by 24% weight loss in the past year with intake </= 75% of estimated energy requirement for >/= 1 month.   Goal: Intake to meet >90% of estimated nutrition needs.  Monitor:  PO intake, labs, weight trend.  Reason for Assessment: MST=5  72 y.o. female  Admitting Dx: Bacteremia with probable endocarditis  ASSESSMENT: Patient was admitted with weakness and fatigue. History of 2 previous episodes of endocarditis, prosthetic mitral valve, Lupus, Pulmonary fibrosis, Afib, NSTEMI, HLD, and CAD. Patient reports that she weighed over 200 lb last December, has lost a lot of weight over the past 10 months. She was staying with her daughter before she came to the hospital, where she seemed to be eating a little better than usual. She does not like the hospital food, nor PO liquid supplements. Agreed to try Ensure Pudding to increase oral intake.  Nutrition Focused Physical Exam:  Subcutaneous Fat:  Orbital Region: WNL Upper Arm Region: WNL Thoracic and Lumbar Region: Mild-moderate depletion  Muscle:  Temple Region: WNL Clavicle Bone Region: Mild-moderate depletion Clavicle and Acromion Bone Region: Mild-moderate depletion Scapular Bone Region: Mild-moderate depletion Dorsal Hand: WNL Patellar Region: WNL Anterior Thigh Region: WNL Posterior Calf Region: WNL  Edema: none   Height: Ht Readings from Last 1 Encounters:  12/12/12 5' 7.5" (1.715 m)    Weight: Wt Readings from Last 1 Encounters:  12/12/12 152 lb 11.2 oz (69.264 kg)    Ideal Body Weight: 62.5 kg  % Ideal Body Weight: 110  Wt Readings from Last 10 Encounters:  12/12/12 152 lb 11.2 oz (69.264 kg)  12/11/12  152 lb 9 oz (69.202 kg)  12/09/12 151 lb 4 oz (68.607 kg)  09/15/12 163 lb (73.936 kg)  09/10/12 158 lb (71.668 kg)  08/07/12 168 lb (76.204 kg)  08/07/12 168 lb 9.6 oz (76.476 kg)  07/22/12 188 lb 4.4 oz (85.4 kg)  07/22/12 188 lb 4.4 oz (85.4 kg)  06/29/12 160 lb (72.576 kg)    Usual Body Weight: 200 lb 10 months ago per patient  % Usual Body Weight: 76%  BMI:  Body mass index is 23.55 kg/(m^2). WNL  Estimated Nutritional Needs: Kcal: 9562-1308 Protein: 85-100 gm Fluid: 1.8-2 L  Skin: no wounds  Diet Order: Cardiac  EDUCATION NEEDS: -Education needs addressed   Intake/Output Summary (Last 24 hours) at 12/13/12 1428 Last data filed at 12/13/12 0630  Gross per 24 hour  Intake    100 ml  Output      3 ml  Net     97 ml    Last BM: 9/19   Labs:   Recent Labs Lab 12/11/12 1428 12/12/12 1357 12/13/12 0418  NA 134* 132* 138  K 3.7 3.4* 3.4*  CL 98 96 102  CO2 24 23 26   BUN 27* 31* 27*  CREATININE 1.21* 1.12* 1.04  CALCIUM 8.9 9.0 8.6  GLUCOSE 98 119* 89    CBG (last 3)  No results found for this basename: GLUCAP,  in the last 72 hours  Scheduled Meds: . antiseptic oral rinse  15 mL Mouth Rinse BID  . atorvastatin  20 mg Oral q1800  . digoxin  0.125 mg Oral q morning - 10a  . furosemide  40 mg  Oral q morning - 10a  . hydroxychloroquine  200 mg Oral BID  . metoprolol tartrate  12.5 mg Oral BID  . pantoprazole  40 mg Oral q morning - 10a  . potassium chloride  40 mEq Oral Daily  . potassium chloride  60 mEq Oral QHS  . predniSONE  5 mg Oral q morning - 10a  . rOPINIRole  1 mg Oral QHS  . sodium chloride  3 mL Intravenous Q12H  . sodium chloride  3 mL Intravenous Q12H  . sulfaSALAzine  1,000 mg Oral BID  . warfarin  3 mg Oral q1800  . Warfarin - Pharmacist Dosing Inpatient   Does not apply q1800    Continuous Infusions:   Past Medical History  Diagnosis Date  . Coronary artery disease   . Atrial fibrillation     Now NSR  . GERD  (gastroesophageal reflux disease)   . Hyperlipidemia   . 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 Anemia   . Angina   . Shortness of breath     with activity  . Hypertension   . Blood transfusion   . Pneumonia   . H/O hiatal hernia   . Lupus   . Insomnia   . Endocarditis   . Prosthetic valve endocarditis   . Enterococcal bacteremia   . Drug rash     Past Surgical History  Procedure Laterality Date  . Cardiac catheterization    . Cardiac valve replacement      2006  . Coronary artery bypass graft    . Givens capsule study  04/09/2011    Procedure: GIVENS CAPSULE STUDY;  Surgeon: Theda Belfast, MD;  Location: [facility] ENDOSCOPY;  Service: Endoscopy;  Laterality: N/A;  . Tee without cardioversion  02/06/2012    Procedure: TRANSESOPHAGEAL ECHOCARDIOGRAM (TEE);  Surgeon: Ricki Rodriguez, MD;  Location: [facility] ENDOSCOPY;  Service: Cardiovascular;  Laterality: N/A;  . Esophagogastroduodenoscopy  02/12/2012    Procedure: ESOPHAGOGASTRODUODENOSCOPY (EGD);  Surgeon: Theda Belfast, MD;  Location: [facility] ENDOSCOPY;  Service: Endoscopy;  Laterality: N/A;  . Tee without cardioversion N/A 07/15/2012    Procedure: TRANSESOPHAGEAL ECHOCARDIOGRAM (TEE);  Surgeon: Ricki Rodriguez, MD;  Location: [facility] ENDOSCOPY;  Service: Cardiovascular;  Laterality: N/A;  . Breast lumpectomy  1977    Joaquin Courts, RD, LDN, CNSC Pager [phone] After Hours Pager [phone]

## 2012-12-13 NOTE — Progress Notes (Signed)
[facility] PROGRESS NOTE  [name] WGN:[id number] DOB: 09/08/1940 DOA: 12/12/2012 PCP: [name], MD  HPI/Subjective: Feels better, denies any shortness of breath. Denies any fever or chills.  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 -Patient is getting a PICC line today. -Last TEE on 07/11/2012 shows vegetations in the prosthetic aortic valve and in the native mitral valve -Likely she will have prolonged antibiotic therapy (likely dual antibiotic therapy). -Dr. Daiva Eves please advise if patient needs TEE again (doubt it's going to change the management as patient is not a surgical candidate)  Enterococcus bacteremia -2 culture showing enterococcus bacteremia (one of the cultures reported previously as gram-negative coccobacilli) -Patient is on vancomycin  Lupus with pulmonary fibrosis -On the immunomodulators including prednisone and Plaquenil.  Atrial fibrillation -On digoxin and beta blockers, rate is controlled. Anticoagulated with Coumadin.  Severe protein calorie malnutrition -Secondary to chronic illness, Diet per registered dietitian.  Code Status: Full code Family Communication: Plan discussed with the patient. Disposition Plan: Remains inpatient   Consultants:  [place] of infectious disease  Procedures:  Done  Antibiotics:  Imipenem and vancomycin   Objective: Filed Vitals:   12/13/12 1437  BP: 134/76  Pulse: 73  Temp: 98.7 F (37.1 C)  Resp: 18    Intake/Output Summary (Last 24 hours) at 12/13/12 1458 Last data filed at 12/13/12 0630  Gross per 24 hour  Intake    100 ml  Output      3 ml  Net     97 ml    Filed Weights   12/12/12 1315 12/12/12 1714  Weight: 68.629 kg (151 lb 4.8 oz) 69.264 kg (152 lb 11.2 oz)    Exam: General: Alert and awake, oriented x3, not in any acute distress. HEENT: anicteric sclera, pupils reactive to light and accommodation, EOMI CVS: S1-S2 clear, no murmur rubs or gallops Chest: clear to auscultation bilaterally, no wheezing, rales or rhonchi Abdomen: soft nontender, nondistended, normal bowel sounds, no organomegaly Extremities: no cyanosis, clubbing or edema noted bilaterally Neuro: Cranial nerves II-XII intact, no focal neurological deficits  Data Reviewed: Basic Metabolic Panel:  Recent Labs Lab 12/09/12 1146 12/11/12 0936 12/11/12 1428 12/12/12 1357 12/13/12 0418  NA 137 137 134* 132* 138  K 3.6 3.9 3.7 3.4* 3.4*  CL 99 94* 98 96 102  CO2 26 20 24 23 26   GLUCOSE 113* 101* 98 119* 89  BUN 26* 24 27* 31* 27*  CREATININE 1.21* 1.11* 1.21* 1.12* 1.04  CALCIUM 9.2 9.2 8.9 9.0 8.6   Liver Function Tests:  Recent Labs Lab 12/09/12 1146 12/11/12 1428 12/12/12 1357  AST 16 13 14   ALT <8 <8 5  ALKPHOS 109 98 116  BILITOT 0.6 0.8 0.5  PROT 7.5 7.2 7.5  ALBUMIN 3.5 3.3* 3.1*   No results found for this basename: LIPASE, AMYLASE,  in the last 168 hours No results found for this basename: AMMONIA,  in the last 168 hours CBC:  Recent Labs Lab 12/09/12 1146 12/11/12 1428 12/12/12 1357 12/13/12 0418  WBC 7.7 7.5 7.3 5.7  NEUTROABS  --  5.3  --   --   HGB 11.6* 10.9* 10.5* 9.7*  HCT 33.9* 31.6* 31.2* 28.6*  MCV 90.4  88.3 89.7 89.7  PLT 167 148* 124* 121*   Cardiac Enzymes: No results found for this basename: CKTOTAL, CKMB, CKMBINDEX, TROPONINI,  in the last 168 hours BNP (last 3 results)  Recent Labs  07/11/12 1901 07/14/12 1651  PROBNP 12830.0* 5791.0*   CBG: No results found for this basename: GLUCAP,  in the last 168 hours  Micro Recent Results (from the past 240 hour(s))  CULTURE, BLOOD (SINGLE)     Status: None    Collection Time    12/09/12 11:40 AM      Result Value Range Status   Preliminary Report ENTEROCOCCUS SPECIES   Preliminary   Comment: Gram Stain Report Called to,Read Back By     and Verified With:     DR VAN DAM 1115 12/12/12 BY KRAWS  CULTURE, BLOOD (SINGLE)     Status: None   Collection Time    12/09/12 11:40 AM      Result Value Range Status   Preliminary Report ENTEROCOCCUS SPECIES   Preliminary   Comment: Corrected results called to:     DR VAN DAM 1115 12/12/12 BY KRAWS     Previously reported as Gram Negative Coccobacilli     Gram Stain Report Called to,Read Back By     and Verified With:     DR VAN DAM 12/11/12 1015 BY SMITHERSJ  CULTURE, BLOOD (SINGLE)     Status: None   Collection Time    12/11/12  2:28 PM      Result Value Range Status   Preliminary Report Blood Culture received; No Growth to date;   Preliminary   Preliminary Report Culture will be held for 5 days before issuing   Preliminary   Preliminary Report a Final Negative report.   Preliminary     Studies: No results found.  Scheduled Meds: . antiseptic oral rinse  15 mL Mouth Rinse BID  . atorvastatin  20 mg Oral q1800  . digoxin  0.125 mg Oral q morning - 10a  . feeding supplement  1 Container Oral TID BM  . furosemide  40 mg Oral q morning - 10a  . hydroxychloroquine  200 mg Oral BID  . metoprolol tartrate  12.5 mg Oral BID  . pantoprazole  40 mg Oral q morning - 10a  . potassium chloride  40 mEq Oral Daily  . potassium chloride  60 mEq Oral QHS  . predniSONE  5 mg Oral q morning - 10a  . rOPINIRole  1 mg Oral QHS  . sodium chloride  3 mL Intravenous Q12H  . sodium chloride  3 mL Intravenous Q12H  . sulfaSALAzine  1,000 mg Oral BID  . warfarin  3 mg Oral q1800  . Warfarin - Pharmacist Dosing Inpatient   Does not apply q1800   Continuous Infusions:      Time spent: 35 minutes    [facility]  [facility] Pager [phone] If 7PM-7AM, please contact night-coverage at [email or web address],  password [facility] 12/13/2012, 2:58 PM  LOS: 1 day

## 2012-12-13 NOTE — Progress Notes (Signed)
ANTIBIOTIC CONSULT NOTE - INITIAL  Pharmacy Consult for Dapto/Ampicillin Indication: Enteroccocus bacteremia  Allergies  Allergen Reactions  . Ceftriaxone     RASH BUT TOLERATED AMPICILLIN AND IMIPENEM WITHOUT PROBLEMS  . Codeine Nausea Only    Patient Measurements: Height: 5' 7.5" (171.5 cm) Weight: 152 lb 11.2 oz (69.264 kg) IBW/kg (Calculated) : 62.75  Vital Signs: Temp: 98.7 F (37.1 C) (09/20 1437) Temp src: Oral (09/20 1437) BP: 134/76 mmHg (09/20 1437) Pulse Rate: 73 (09/20 1437) Intake/Output from previous day: 09/19 0701 - 09/20 0700 In: 100 [IV Piggyback:100] Out: 3 [Urine:3] Intake/Output from this shift:    Labs:  Recent Labs  12/11/12 1428 12/12/12 1357 12/13/12 0418  WBC 7.5 7.3 5.7  HGB 10.9* 10.5* 9.7*  PLT 148* 124* 121*  CREATININE 1.21* 1.12* 1.04   Estimated Creatinine Clearance: 47 ml/min (by C-G formula based on Cr of 1.04). No results found for this basename: VANCOTROUGH, Leodis Binet, VANCORANDOM, GENTTROUGH, GENTPEAK, GENTRANDOM, TOBRATROUGH, TOBRAPEAK, TOBRARND, AMIKACINPEAK, AMIKACINTROU, AMIKACIN,  in the last 72 hours   Microbiology: Recent Results (from the past 720 hour(s))  CULTURE, BLOOD (SINGLE)     Status: None   Collection Time    12/09/12 11:40 AM      Result Value Range Status   Preliminary Report ENTEROCOCCUS SPECIES   Preliminary   Comment: Gram Stain Report Called to,Read Back By     and Verified With:     DR VAN DAM 1115 12/12/12 BY KRAWS  CULTURE, BLOOD (SINGLE)     Status: None   Collection Time    12/09/12 11:40 AM      Result Value Range Status   Preliminary Report ENTEROCOCCUS SPECIES   Preliminary   Comment: Corrected results called to:     DR VAN DAM 1115 12/12/12 BY KRAWS     Previously reported as Gram Negative Coccobacilli     Gram Stain Report Called to,Read Back By     and Verified With:     DR VAN DAM 12/11/12 1015 BY SMITHERSJ  CULTURE, BLOOD (SINGLE)     Status: None   Collection Time    12/11/12   2:28 PM      Result Value Range Status   Preliminary Report Blood Culture received; No Growth to date;   Preliminary   Preliminary Report Culture will be held for 5 days before issuing   Preliminary   Preliminary Report a Final Negative report.   Preliminary    Medical History: Past Medical History  Diagnosis Date  . Coronary artery disease   . Atrial fibrillation     Now NSR  . GERD (gastroesophageal reflux disease)   . Hyperlipidemia   . 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 Anemia   . Angina   . Shortness of breath     with activity  . Hypertension   . Blood transfusion   . Pneumonia   . H/O hiatal hernia   . Lupus   . Insomnia   . Endocarditis   . Prosthetic valve endocarditis   . Enterococcal bacteremia   . Drug rash     Medications:  Scheduled:  . antiseptic oral rinse  15 mL Mouth Rinse BID  . atorvastatin  20 mg Oral q1800  . digoxin  0.125 mg Oral q morning - 10a  . feeding supplement  1 Container Oral TID BM  . furosemide  40 mg Oral q morning - 10a  . hydroxychloroquine  200  mg Oral BID  . metoprolol tartrate  12.5 mg Oral BID  . pantoprazole  40 mg Oral q morning - 10a  . potassium chloride  40 mEq Oral Daily  . potassium chloride  60 mEq Oral QHS  . predniSONE  5 mg Oral q morning - 10a  . rOPINIRole  1 mg Oral QHS  . sodium chloride  3 mL Intravenous Q12H  . sodium chloride  3 mL Intravenous Q12H  . sulfaSALAzine  1,000 mg Oral BID  . warfarin  3 mg Oral q1800  . 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 Patient presents w/ G+ organisms now shown to be enteroccocus. Afebrile, WBC stable 7.3>5.7 (on steriods). Patients renal function has improving from 1.12>1.04.   Plan:  - Start Daptomycin 10mg /kg/day (700mg ) IV  q24h - Start Ampicillin 2g IV q6h - F/U on repeat blood cultures, clinical progression, CPK and renal function  Forestine Na M 12/13/2012,3:06 PM

## 2012-12-13 NOTE — Progress Notes (Signed)
Patient's b/p 112/58 manually- called NP- verbal order to hold lopressor. Will continue to monitor pt

## 2012-12-13 NOTE — Progress Notes (Signed)
[facility]     Subjective: No new complaints   Antibiotics:  Anti-infectives   Start     Dose/Rate Route Frequency Ordered Stop   12/13/12 1800  ampicillin (OMNIPEN) 2 g in sodium chloride 0.9 % 50 mL IVPB     2 g 150 mL/hr over 20 Minutes Intravenous 4 times per day 12/13/12 1516     12/13/12 1530  DAPTOmycin (CUBICIN) 700 mg in sodium chloride 0.9 % IVPB     700 mg 228 mL/hr over 30 Minutes Intravenous Every 24 hours 12/13/12 1516     12/13/12 0400  vancomycin (VANCOCIN) 500 mg in sodium chloride 0.9 % 100 mL IVPB  Status:  Discontinued     500 mg 100 mL/hr over 60 Minutes Intravenous Every 12 hours 12/12/12 1447 12/12/12 1635   12/13/12 0400  vancomycin (VANCOCIN) 500 mg in sodium chloride 0.9 % 100 mL IVPB  Status:  Discontinued     500 mg 100 mL/hr over 60 Minutes Intravenous Every 12 hours 12/12/12 1702 12/13/12 1401   12/13/12 0000  imipenem-cilastatin (PRIMAXIN) 250 mg in sodium chloride 0.9 % 100 mL IVPB  Status:  Discontinued     250 mg 200 mL/hr over 30 Minutes Intravenous 4 times per day 12/12/12 1447 12/12/12 1635   12/13/12 0000  imipenem-cilastatin (PRIMAXIN) 250 mg in sodium chloride 0.9 % 100 mL IVPB  Status:  Discontinued     250 mg 200 mL/hr over 30 Minutes Intravenous 4 times per day 12/12/12 1703 12/13/12 1401   12/12/12 2200  hydroxychloroquine (PLAQUENIL) tablet 200 mg     200 mg Oral 2 times daily 12/12/12 1634     12/12/12 1800  imipenem-cilastatin (PRIMAXIN) 500 mg in sodium chloride 0.9 % 100 mL IVPB     500 mg 200 mL/hr over 30 Minutes Intravenous STAT 12/12/12 1749 12/12/12 1836   12/12/12 1430  vancomycin (VANCOCIN) 1,750 mg in sodium chloride 0.9 % 500 mL IVPB     1,750 mg 250 mL/hr over 120 Minutes Intravenous STAT 12/12/12 1427 12/12/12 1706   12/12/12 1430  imipenem-cilastatin (PRIMAXIN) 500 mg in sodium chloride 0.9 % 100 mL IVPB  Status:  Discontinued     500 mg 200 mL/hr over 30 Minutes Intravenous STAT  12/12/12 1427 12/12/12 1836   12/12/12 1415  cefTRIAXone (ROCEPHIN) 2 g in dextrose 5 % 50 mL IVPB  Status:  Discontinued     2 g 100 mL/hr over 30 Minutes Intravenous  Once 12/12/12 1408 12/12/12 1430      Medications: Scheduled Meds: . ampicillin (OMNIPEN) IV  2 g Intravenous Q6H  . antiseptic oral rinse  15 mL Mouth Rinse BID  . DAPTOmycin (CUBICIN)  IV  700 mg Intravenous Q24H  . digoxin  0.125 mg Oral q morning - 10a  . feeding supplement  1 Container Oral TID BM  . furosemide  40 mg Oral q morning - 10a  . hydroxychloroquine  200 mg Oral BID  . metoprolol tartrate  12.5 mg Oral BID  . pantoprazole  40 mg Oral q morning - 10a  . potassium chloride  40 mEq Oral Daily  . potassium chloride  60 mEq Oral QHS  . predniSONE  5 mg Oral q morning - 10a  . rOPINIRole  1 mg Oral QHS  . sodium chloride  3 mL Intravenous Q12H  . sodium chloride  3 mL Intravenous Q12H  . sulfaSALAzine  1,000 mg Oral BID  . warfarin  3 mg Oral q1800  . Warfarin - Pharmacist Dosing Inpatient   Does not apply q1800   Continuous Infusions:  PRN Meds:.sodium chloride, acetaminophen, acetaminophen, alum & mag hydroxide-simeth, ondansetron (ZOFRAN) IV, ondansetron, polyethylene glycol, sodium chloride, zolpidem   Objective: Weight change:   Intake/Output Summary (Last 24 hours) at 12/13/12 1611 Last data filed at 12/13/12 0630  Gross per 24 hour  Intake    100 ml  Output      3 ml  Net     97 ml   Blood pressure 134/76, pulse 73, temperature 98.7 F (37.1 C), temperature source Oral, resp. rate 18, height 5' 7.5" (1.715 m), weight 152 lb 11.2 oz (69.264 kg), SpO2 98.00%. Temp:  [98.2 F (36.8 C)-99.1 F (37.3 C)] 98.7 F (37.1 C) (09/20 1437) Pulse Rate:  [51-73] 73 (09/20 1437) Resp:  [18-20] 18 (09/20 1437) BP: (104-134)/(52-76) 134/76 mmHg (09/20 1437) SpO2:  [90 %-98 %] 98 % (09/20 1437) Weight:  [152 lb 11.2 oz (69.264 kg)] 152 lb 11.2 oz (69.264 kg) (09/19 1714)  Physical Exam: General:  Alert and awake, oriented x3, Draped for PICC line insertion  Lab Results:  Recent Labs  12/12/12 1357 12/13/12 0418  WBC 7.3 5.7  HGB 10.5* 9.7*  HCT 31.2* 28.6*  PLT 124* 121*    BMET  Recent Labs  12/12/12 1357 12/13/12 0418  NA 132* 138  K 3.4* 3.4*  CL 96 102  CO2 23 26  GLUCOSE 119* 89  BUN 31* 27*  CREATININE 1.12* 1.04  CALCIUM 9.0 8.6    Micro Results: Recent Results (from the past 240 hour(s))  CULTURE, BLOOD (SINGLE)     Status: None   Collection Time    12/09/12 11:40 AM      Result Value Range Status   Preliminary Report ENTEROCOCCUS SPECIES   Preliminary   Comment: Gram Stain Report Called to,Read Back By     and Verified With:     DR VAN DAM 1115 12/12/12 BY KRAWS  CULTURE, BLOOD (SINGLE)     Status: None   Collection Time    12/09/12 11:40 AM      Result Value Range Status   Preliminary Report ENTEROCOCCUS SPECIES   Preliminary   Comment: Corrected results called to:     DR VAN DAM 1115 12/12/12 BY KRAWS     Previously reported as Gram Negative Coccobacilli     Gram Stain Report Called to,Read Back By     and Verified With:     DR VAN DAM 12/11/12 1015 BY SMITHERSJ  CULTURE, BLOOD (SINGLE)     Status: None   Collection Time    12/11/12  2:28 PM      Result Value Range Status   Preliminary Report Blood Culture received; No Growth to date;   Preliminary   Preliminary Report Culture will be held for 5 days before issuing   Preliminary   Preliminary Report a Final Negative report.   Preliminary    Studies/Results: No results found.    Assessment/Plan: [name] is a 72 y.o. 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

## 2012-12-13 NOTE — Progress Notes (Signed)
Attempt to insert PICC in Rt. Arm x 2  Unsuccessful.  Unable to advance PICC centrally.   Meeting some type of resistance.  If PICC still desired may consider referring to radiology.

## 2012-12-14 DIAGNOSIS — D62 Acute posthemorrhagic anemia: Secondary | ICD-10-CM

## 2012-12-14 LAB — PROTIME-INR
INR: 1.94 — ABNORMAL HIGH (ref 0.00–1.49)
Prothrombin Time: 21.6 seconds — ABNORMAL HIGH (ref 11.6–15.2)

## 2012-12-14 LAB — CK: Total CK: 27 U/L (ref 7–177)

## 2012-12-14 LAB — BASIC METABOLIC PANEL
BUN: 19 mg/dL (ref 6–23)
Calcium: 8.7 mg/dL (ref 8.4–10.5)
Creatinine, Ser: 0.88 mg/dL (ref 0.50–1.10)
GFR calc Af Amer: 73 mL/min — ABNORMAL LOW (ref >90)
GFR calc non Af Amer: 63 mL/min — ABNORMAL LOW (ref >90)
Glucose, Bld: 105 mg/dL — ABNORMAL HIGH (ref 70–99)

## 2012-12-14 LAB — CULTURE, BLOOD (SINGLE)

## 2012-12-14 MED ORDER — TEMAZEPAM 7.5 MG PO CAPS: 7.5000 mg | ORAL_CAPSULE | Freq: Once | ORAL | Status: DC

## 2012-12-14 MED ORDER — SODIUM CHLORIDE 0.9 % IV SOLN
2.0000 g | INTRAVENOUS | Status: DC
  Administered 2012-12-14 – 2012-12-15 (×6): 2 g via INTRAVENOUS
  Filled 2012-12-14 (×11): qty 2000

## 2012-12-14 MED ORDER — WARFARIN SODIUM 4 MG PO TABS
4.0000 mg | ORAL_TABLET | Freq: Once | ORAL | Status: AC
  Administered 2012-12-14: 4 mg via ORAL
  Filled 2012-12-14: qty 1

## 2012-12-14 MED ORDER — GENTAMICIN IN SALINE 1.2-0.9 MG/ML-% IV SOLN
60.0000 mg | Freq: Two times a day (BID) | INTRAVENOUS | Status: DC
  Administered 2012-12-14 – 2012-12-15 (×2): 60 mg via INTRAVENOUS
  Filled 2012-12-14 (×4): qty 50

## 2012-12-14 MED ORDER — ZOLPIDEM TARTRATE 5 MG PO TABS: 5.0000 mg | ORAL_TABLET | Freq: Once | ORAL | Status: DC

## 2012-12-14 MED ORDER — TEMAZEPAM 7.5 MG PO CAPS
7.5000 mg | ORAL_CAPSULE | Freq: Once | ORAL | Status: AC
  Administered 2012-12-14: 7.5 mg via ORAL

## 2012-12-14 NOTE — Progress Notes (Signed)
Patient no longer wants to take Ambien for sleep- she reports that it has been giving her bad dreams. RN called NP for something else. Awaiting order. Will continue to monitor

## 2012-12-14 NOTE — Progress Notes (Signed)
Pt had 8 beats of v-tach. Pt denied SOB, chills, and dizziness. Will continue to monitor pt.

## 2012-12-14 NOTE — Progress Notes (Signed)
ANTICOAGULATION CONSULT NOTE - Follow Up Consult  Pharmacy Consult for Warfarin Indication: Afib  Allergies  Allergen Reactions  . Ceftriaxone     RASH BUT TOLERATED AMPICILLIN AND IMIPENEM WITHOUT PROBLEMS  . Codeine Nausea Only    Patient Measurements: Height: 5' 7.5" (171.5 cm) Weight: 152 lb 11.2 oz (69.264 kg) IBW/kg (Calculated) : 62.75  Vital Signs: Temp: 98.9 F (37.2 C) (09/21 0531) Temp src: Oral (09/21 0531) BP: 130/71 mmHg (09/21 0531) Pulse Rate: 84 (09/21 0531)  Labs:  Recent Labs  12/11/12 1428 12/12/12 1357 12/12/12 1409 12/13/12 0418 12/13/12 0945 12/13/12 1704 12/14/12 0531  HGB 10.9* 10.5*  --  9.7*  --   --   --   HCT 31.6* 31.2*  --  28.6*  --   --   --   PLT 148* 124*  --  121*  --   --   --   LABPROT  --   --  22.5*  --  21.1*  --  21.6*  INR  --   --  2.05*  --  1.89*  --  1.94*  CREATININE 1.21* 1.12*  --  1.04  --   --   --   CKTOTAL  --   --   --   --   --  33 27    Estimated Creatinine Clearance: 47 ml/min (by C-G formula based on Cr of 1.04).   Medications:  Scheduled:  . ampicillin (OMNIPEN) IV  2 g Intravenous Q6H  . antiseptic oral rinse  15 mL Mouth Rinse BID  . DAPTOmycin (CUBICIN)  IV  700 mg Intravenous Q24H  . digoxin  0.125 mg Oral q morning - 10a  . feeding supplement  1 Container Oral TID BM  . hydroxychloroquine  200 mg Oral BID  . metoprolol tartrate  12.5 mg Oral BID  . pantoprazole  40 mg Oral q morning - 10a  . potassium chloride  40 mEq Oral Daily  . potassium chloride  60 mEq Oral QHS  . predniSONE  5 mg Oral q morning - 10a  . rOPINIRole  1 mg Oral QHS  . sodium chloride  3 mL Intravenous Q12H  . sodium chloride  3 mL Intravenous Q12H  . sulfaSALAzine  1,000 mg Oral BID  . warfarin  3 mg Oral q1800  . Warfarin - Pharmacist Dosing Inpatient   Does not apply q1800    Assessment: 72 yo female on chronic Coumadin for afib admitted for possible recurrence of endocarditis. INR subtherapeutic, but trending  up 1.89>1.94. Per patient and daughter, she takes Coumadin 3 mg daily at home in the mornings. No bleeding complications noted.  Goal of Therapy:  INR 2-3 Monitor platelets by anticoagulation protocol: Yes   Plan:  - Increase Warfarin to 4mg  x1, plan to restart home dose if therapeutic tomorrow.  - F/u on PT/INR and signs of bleed  Forestine Na M 12/14/2012,8:28 AM

## 2012-12-14 NOTE — Progress Notes (Signed)
ANTIBIOTIC CONSULT NOTE - INITIAL  Pharmacy Consult for Gentamicin/Amp/Dapto Indication: Entercoccus bacteremia w/ possible endocarditis  Allergies  Allergen Reactions  . Ceftriaxone     RASH BUT TOLERATED AMPICILLIN AND IMIPENEM WITHOUT PROBLEMS  . Codeine Nausea Only    Patient Measurements: Height: 5' 7.5" (171.5 cm) Weight: 152 lb 11.2 oz (69.264 kg) IBW/kg (Calculated) : 62.75  Vital Signs: Temp: 98.9 F (37.2 C) (09/21 0531) Temp src: Oral (09/21 0531) BP: 130/71 mmHg (09/21 0531) Pulse Rate: 84 (09/21 0531) Intake/Output from previous day: 09/20 0701 - 09/21 0700 In: -  Out: 2 [Urine:2] Intake/Output from this shift:    Labs:  Recent Labs  12/11/12 1428 12/12/12 1357 12/13/12 0418  WBC 7.5 7.3 5.7  HGB 10.9* 10.5* 9.7*  PLT 148* 124* 121*  CREATININE 1.21* 1.12* 1.04   Estimated Creatinine Clearance: 47 ml/min (by C-G formula based on Cr of 1.04). No results found for this basename: VANCOTROUGH, Leodis Binet, VANCORANDOM, GENTTROUGH, GENTPEAK, GENTRANDOM, TOBRATROUGH, TOBRAPEAK, TOBRARND, AMIKACINPEAK, AMIKACINTROU, AMIKACIN,  in the last 72 hours   Microbiology: Recent Results (from the past 720 hour(s))  CULTURE, BLOOD (SINGLE)     Status: None   Collection Time    12/09/12 11:40 AM      Result Value Range Status   Preliminary Report ENTEROCOCCUS SPECIES   Preliminary   Comment: Gram Stain Report Called to,Read Back By     and Verified With:     DR VAN DAM 1115 12/12/12 BY KRAWS  CULTURE, BLOOD (SINGLE)     Status: None   Collection Time    12/09/12 11:40 AM      Result Value Range Status   Preliminary Report ENTEROCOCCUS SPECIES   Preliminary   Comment: Corrected results called to:     DR VAN DAM 1115 12/12/12 BY KRAWS     Previously reported as Gram Negative Coccobacilli     Gram Stain Report Called to,Read Back By     and Verified With:     DR VAN DAM 12/11/12 1015 BY SMITHERSJ  CULTURE, BLOOD (SINGLE)     Status: None   Collection Time   12/11/12  2:28 PM      Result Value Range Status   Preliminary Report Blood Culture received; No Growth to date;   Preliminary   Preliminary Report Culture will be held for 5 days before issuing   Preliminary   Preliminary Report a Final Negative report.   Preliminary    Medical History: Past Medical History  Diagnosis Date  . Coronary artery disease   . Atrial fibrillation     Now NSR  . GERD (gastroesophageal reflux disease)   . Hyperlipidemia   . 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 Anemia   . Angina   . Shortness of breath     with activity  . Hypertension   . Blood transfusion   . Pneumonia   . H/O hiatal hernia   . Lupus   . Insomnia   . Endocarditis   . Prosthetic valve endocarditis   . Enterococcal bacteremia   . Drug rash     Medications:  Scheduled:  . ampicillin (OMNIPEN) IV  2 g Intravenous Q6H  . antiseptic oral rinse  15 mL Mouth Rinse BID  . DAPTOmycin (CUBICIN)  IV  700 mg Intravenous Q24H  . digoxin  0.125 mg Oral q morning - 10a  . feeding supplement  1 Container Oral TID BM  . hydroxychloroquine  200 mg Oral BID  . metoprolol tartrate  12.5 mg Oral BID  . pantoprazole  40 mg Oral q morning - 10a  . potassium chloride  40 mEq Oral Daily  . potassium chloride  60 mEq Oral QHS  . predniSONE  5 mg Oral q morning - 10a  . rOPINIRole  1 mg Oral QHS  . sodium chloride  3 mL Intravenous Q12H  . sodium chloride  3 mL Intravenous Q12H  . sulfaSALAzine  1,000 mg Oral BID  . warfarin  4 mg Oral ONCE-1800  . 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 Patient presents w/ G+ organisms now shown to be enteroccocus. Afebrile, WBC stable 7.3>5.7 (on steriods). Patients renal function has improved 0.88 w/ eCrCl=55.6. However based on  previous levels back in November (pk 11.4) would be more conservative with dosing.   IBW: 62.75 Population Kinetics: Ke=0.1769hr- Vd=17.57 L T1/2=~4hr Tao=~12hr  Goal of Therapy:  Gentamicin peak 3-4 mg/L Gentamicin trough <1 mg/L  Plan:  - Start Gentamicin 60mg  IV q12h - Per guidelines adjust Ampicillin at 2g IV q4h - Continue Daptomycin 700mg  IV q24hr - Monitor pt renal function closely, CPK, and sensitives - Obtain gent peak and trough at steady state.    Forestine Na M 12/14/2012,11:14 AM

## 2012-12-14 NOTE — Progress Notes (Signed)
[facility]   Day # 2 amp/gentamicin  Subjective: No new complaints   Antibiotics:  Anti-infectives   Start     Dose/Rate Route Frequency Ordered Stop   12/14/12 1700  gentamicin (GARAMYCIN) IVPB 60 mg     60 mg 100 mL/hr over 30 Minutes Intravenous Every 12 hours 12/14/12 1336     12/14/12 1600  ampicillin (OMNIPEN) 2 g in sodium chloride 0.9 % 50 mL IVPB     2 g 150 mL/hr over 20 Minutes Intravenous 6 times per day 12/14/12 1336     12/13/12 1800  ampicillin (OMNIPEN) 2 g in sodium chloride 0.9 % 50 mL IVPB  Status:  Discontinued     2 g 150 mL/hr over 20 Minutes Intravenous 4 times per day 12/13/12 1516 12/14/12 1336   12/13/12 1530  DAPTOmycin (CUBICIN) 700 mg in sodium chloride 0.9 % IVPB  Status:  Discontinued     700 mg 228 mL/hr over 30 Minutes Intravenous Every 24 hours 12/13/12 1516 12/14/12 1927   12/13/12 0400  vancomycin (VANCOCIN) 500 mg in sodium chloride 0.9 % 100 mL IVPB  Status:  Discontinued     500 mg 100 mL/hr over 60 Minutes Intravenous Every 12 hours 12/12/12 1447 12/12/12 1635   12/13/12 0400  vancomycin (VANCOCIN) 500 mg in sodium chloride 0.9 % 100 mL IVPB  Status:  Discontinued     500 mg 100 mL/hr over 60 Minutes Intravenous Every 12 hours 12/12/12 1702 12/13/12 1401   12/13/12 0000  imipenem-cilastatin (PRIMAXIN) 250 mg in sodium chloride 0.9 % 100 mL IVPB  Status:  Discontinued     250 mg 200 mL/hr over 30 Minutes Intravenous 4 times per day 12/12/12 1447 12/12/12 1635   12/13/12 0000  imipenem-cilastatin (PRIMAXIN) 250 mg in sodium chloride 0.9 % 100 mL IVPB  Status:  Discontinued     250 mg 200 mL/hr over 30 Minutes Intravenous 4 times per day 12/12/12 1703 12/13/12 1401   12/12/12 2200  hydroxychloroquine (PLAQUENIL) tablet 200 mg     200 mg Oral 2 times daily 12/12/12 1634     12/12/12 1800  imipenem-cilastatin (PRIMAXIN) 500 mg in sodium chloride 0.9 % 100 mL IVPB     500 mg 200 mL/hr over 30 Minutes  Intravenous STAT 12/12/12 1749 12/12/12 1836   12/12/12 1430  vancomycin (VANCOCIN) 1,750 mg in sodium chloride 0.9 % 500 mL IVPB     1,750 mg 250 mL/hr over 120 Minutes Intravenous STAT 12/12/12 1427 12/12/12 1706   12/12/12 1430  imipenem-cilastatin (PRIMAXIN) 500 mg in sodium chloride 0.9 % 100 mL IVPB  Status:  Discontinued     500 mg 200 mL/hr over 30 Minutes Intravenous STAT 12/12/12 1427 12/12/12 1836   12/12/12 1415  cefTRIAXone (ROCEPHIN) 2 g in dextrose 5 % 50 mL IVPB  Status:  Discontinued     2 g 100 mL/hr over 30 Minutes Intravenous  Once 12/12/12 1408 12/12/12 1430      Medications: Scheduled Meds: . ampicillin (OMNIPEN) IV  2 g Intravenous Q4H  . antiseptic oral rinse  15 mL Mouth Rinse BID  . digoxin  0.125 mg Oral q morning - 10a  . feeding supplement  1 Container Oral TID BM  . gentamicin  60 mg Intravenous Q12H  . hydroxychloroquine  200 mg Oral BID  . metoprolol tartrate  12.5 mg Oral BID  . pantoprazole  40 mg Oral q morning - 10a  .  potassium chloride  40 mEq Oral Daily  . potassium chloride  60 mEq Oral QHS  . predniSONE  5 mg Oral q morning - 10a  . rOPINIRole  1 mg Oral QHS  . sodium chloride  3 mL Intravenous Q12H  . sodium chloride  3 mL Intravenous Q12H  . sulfaSALAzine  1,000 mg Oral BID  . Warfarin - Pharmacist Dosing Inpatient   Does not apply q1800   Continuous Infusions:  PRN Meds:.sodium chloride, acetaminophen, acetaminophen, alum & mag hydroxide-simeth, ondansetron (ZOFRAN) IV, ondansetron, polyethylene glycol, sodium chloride, zolpidem   Objective: Weight change:   Intake/Output Summary (Last 24 hours) at 12/14/12 1927 Last data filed at 12/14/12 1823  Gross per 24 hour  Intake    504 ml  Output      3 ml  Net    501 ml   Blood pressure 130/71, pulse 60, temperature 98.5 F (36.9 C), temperature source Oral, resp. rate 18, height 5' 7.5" (1.715 m), weight 152 lb 11.2 oz (69.264 kg), SpO2 99.00%. Temp:  [98.5 F (36.9 C)-100 F  (37.8 C)] 98.5 F (36.9 C) (09/21 1348) Pulse Rate:  [60-84] 60 (09/21 1348) Resp:  [18] 18 (09/21 0531) BP: (104-130)/(58-71) 130/71 mmHg (09/21 0531) SpO2:  [95 %-99 %] 99 % (09/21 1348)  Physical Exam: General: Alert and awake, oriented x3, not in any acute distress. HEENT: anicteric sclera, EOMI CVS IRR IRR rate, normal r,  no murmur rubs or gallops Chest: clear to auscultation bilaterally, no wheezing, rales or rhonchi Abdomen: soft nontender, nondistended, normal bowel sounds, Neuro: nonfocal  Lab Results:  Recent Labs  12/12/12 1357 12/13/12 0418  WBC 7.3 5.7  HGB 10.5* 9.7*  HCT 31.2* 28.6*  PLT 124* 121*    BMET  Recent Labs  12/13/12 0418 12/14/12 1105  NA 138 134*  K 3.4* 2.9*  CL 102 99  CO2 26 28  GLUCOSE 89 105*  BUN 27* 19  CREATININE 1.04 0.88  CALCIUM 8.6 8.7    Micro Results: Recent Results (from the past 240 hour(s))  CULTURE, BLOOD (SINGLE)     Status: None   Collection Time    12/09/12 11:40 AM      Result Value Range Status   Organism ID, Bacteria ENTEROCOCCUS SPECIES   Final   Comment: Gram Stain Report Called to,Read Back By     and Verified With:     DR VAN DAM 1115 12/12/12 BY KRAWS     Susceptibilities performed on previous culture     within the last 5 days.  CULTURE, BLOOD (SINGLE)     Status: None   Collection Time    12/09/12 11:40 AM      Result Value Range Status   Culture ENTEROCOCCUS SPECIES   Final   Organism ID, Bacteria ENTEROCOCCUS SPECIES   Final   Comment: Corrected results called to:     DR VAN DAM 1115 12/12/12 BY KRAWS     Previously reported as Gram Negative Coccobacilli     Combination Therapy of high-dose Ampicillin or     Vancomycin,plus an aminoglycoside,is usually     indicated for serious Enterococcal infections.     Gram Stain Report Called to,Read Back By     and Verified With:     DR VAN DAM 12/11/12 1015 BY SMITHERSJ  CULTURE, BLOOD (SINGLE)     Status: None   Collection Time    12/11/12  2:28  PM      Result Value Range  Status   Preliminary Report Blood Culture received; No Growth to date;   Preliminary   Preliminary Report Culture will be held for 5 days before issuing   Preliminary   Preliminary Report a Final Negative report.   Preliminary  CULTURE, BLOOD (SINGLE)     Status: None   Collection Time    12/11/12  2:28 PM      Result Value Range Status   Preliminary Report Blood Culture received; No Growth to date;   Preliminary   Preliminary Report Culture will be held for 5 days before issuing   Preliminary   Preliminary Report a Final Negative report.   Preliminary    Studies/Results: No results found.    Assessment/Plan: [name] is a 72 y.o. female  with 2nd  recurrence of enterococcal bacteremia   She has documented  mitral valve and  prosthetic aortic valve endocarditis. 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 Ninetta Lights is back tomorrow.        LOS: 2 days   [name] 12/14/2012, 7:27 PM

## 2012-12-14 NOTE — Progress Notes (Addendum)
[facility] PROGRESS NOTE  [name]:[id number] DOB: 09/12/1940 DOA: 12/12/2012 PCP: [name], MD  HPI/Subjective: Denies any fever or chills. IV team was not able to place the PICC yesterday.  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 -Last TEE on 07/11/2012 shows vegetations in the prosthetic aortic valve and in the native mitral valve -Likely she will have prolonged antibiotic therapy (likely dual antibiotic therapy). -Dr. Daiva Eves please advise if patient needs TEE again.  Enterococcus bacteremia -2 culture showing enterococcus bacteremia (one of the cultures reported previously as gram-negative coccobacilli) -Patient is on vancomycin -Patient to IR for PICC placement  Lupus with pulmonary fibrosis -On the immunomodulators including prednisone and Plaquenil.  Atrial fibrillation -On digoxin and beta blockers, rate is controlled. -Anticoagulated with Coumadin.  Severe protein calorie malnutrition -Secondary to chronic illness, Diet per registered dietitian.  Code Status: Full code Family Communication: Plan discussed with the patient. Disposition Plan: Remains inpatient   Consultants:  [place] of infectious disease  Procedures:  Done  Antibiotics:  Imipenem and vancomycin  Now on ampicillin and daptomycin   Objective: Filed Vitals:   12/14/12 0531  BP: 130/71  Pulse: 84  Temp: 98.9 F (37.2 C)  Resp: 18    Intake/Output Summary (Last 24 hours) at 12/14/12 1037 Last data filed at 12/13/12 2312  Gross per 24 hour  Intake      0 ml  Output      2 ml  Net      -2 ml   Filed Weights   12/12/12 1315 12/12/12 1714  Weight: 68.629 kg (151 lb 4.8 oz) 69.264 kg (152 lb 11.2 oz)    Exam: General: Alert and awake, oriented x3, not in any acute distress. HEENT: anicteric sclera, pupils reactive to light and accommodation, EOMI CVS: S1-S2 clear, no murmur rubs or gallops Chest: clear to auscultation bilaterally, no wheezing, rales or rhonchi Abdomen: soft nontender, nondistended, normal bowel sounds, no organomegaly Extremities: no cyanosis, clubbing or edema noted bilaterally Neuro: Cranial nerves II-XII intact, no focal neurological deficits  Data Reviewed: Basic Metabolic Panel:  Recent Labs Lab 12/09/12 1146 12/11/12 0936 12/11/12 1428 12/12/12 1357 12/13/12 0418  NA 137 137 134* 132* 138  K 3.6 3.9 3.7 3.4* 3.4*  CL 99 94* 98 96 102  CO2 26 20 24 23 26   GLUCOSE 113* 101* 98 119* 89  BUN 26* 24 27* 31* 27*  CREATININE 1.21* 1.11* 1.21* 1.12* 1.04  CALCIUM 9.2 9.2 8.9 9.0 8.6   Liver Function Tests:  Recent Labs Lab 12/09/12 1146 12/11/12 1428 12/12/12 1357  AST 16 13 14   ALT <8 <8 5  ALKPHOS 109 98 116  BILITOT 0.6 0.8 0.5  PROT 7.5 7.2 7.5  ALBUMIN 3.5 3.3* 3.1*   No results found for this basename: LIPASE, AMYLASE,  in the last 168 hours No results found for this basename: AMMONIA,  in the last 168 hours CBC:  Recent Labs Lab 12/09/12 1146 12/11/12 1428 12/12/12 1357 12/13/12 0418  WBC 7.7 7.5 7.3 5.7  NEUTROABS  --  5.3  --   --   HGB 11.6* 10.9* 10.5* 9.7*  HCT 33.9* 31.6*  31.2* 28.6*  MCV 90.4 88.3 89.7 89.7  PLT 167 148* 124* 121*   Cardiac Enzymes:  Recent Labs Lab 12/13/12 1704 12/14/12 0531  CKTOTAL 33 27   BNP (last 3 results)  Recent Labs  07/11/12 1901 07/14/12 1651  PROBNP 12830.0* 5791.0*   CBG: No results found for this basename: GLUCAP,  in the last 168 hours  Micro Recent Results (from the past 240 hour(s))  CULTURE, BLOOD (SINGLE)     Status: None   Collection Time     12/09/12 11:40 AM      Result Value Range Status   Preliminary Report ENTEROCOCCUS SPECIES   Preliminary   Comment: Gram Stain Report Called to,Read Back By     and Verified With:     DR VAN DAM 1115 12/12/12 BY KRAWS  CULTURE, BLOOD (SINGLE)     Status: None   Collection Time    12/09/12 11:40 AM      Result Value Range Status   Preliminary Report ENTEROCOCCUS SPECIES   Preliminary   Comment: Corrected results called to:     DR VAN DAM 1115 12/12/12 BY KRAWS     Previously reported as Gram Negative Coccobacilli     Gram Stain Report Called to,Read Back By     and Verified With:     DR VAN DAM 12/11/12 1015 BY SMITHERSJ  CULTURE, BLOOD (SINGLE)     Status: None   Collection Time    12/11/12  2:28 PM      Result Value Range Status   Preliminary Report Blood Culture received; No Growth to date;   Preliminary   Preliminary Report Culture will be held for 5 days before issuing   Preliminary   Preliminary Report a Final Negative report.   Preliminary     Studies: No results found.  Scheduled Meds: . ampicillin (OMNIPEN) IV  2 g Intravenous Q6H  . antiseptic oral rinse  15 mL Mouth Rinse BID  . DAPTOmycin (CUBICIN)  IV  700 mg Intravenous Q24H  . digoxin  0.125 mg Oral q morning - 10a  . feeding supplement  1 Container Oral TID BM  . hydroxychloroquine  200 mg Oral BID  . metoprolol tartrate  12.5 mg Oral BID  . pantoprazole  40 mg Oral q morning - 10a  . potassium chloride  40 mEq Oral Daily  . potassium chloride  60 mEq Oral QHS  . predniSONE  5 mg Oral q morning - 10a  . rOPINIRole  1 mg Oral QHS  . sodium chloride  3 mL Intravenous Q12H  . sodium chloride  3 mL Intravenous Q12H  . sulfaSALAzine  1,000 mg Oral BID  . warfarin  4 mg Oral ONCE-1800  . Warfarin - Pharmacist Dosing Inpatient   Does not apply q1800   Continuous Infusions:      Time spent: 35 minutes    [facility] A  [facility] Pager [phone] If 7PM-7AM, please contact night-coverage at  [email or web address], password [facility] 12/14/2012, 10:37 AM  LOS: 2 days

## 2012-12-15 ENCOUNTER — Ambulatory Visit: Payer: Medicare Other | Admitting: Internal Medicine

## 2012-12-15 ENCOUNTER — Ambulatory Visit: Payer: Self-pay | Admitting: Internal Medicine

## 2012-12-15 ENCOUNTER — Ambulatory Visit: Payer: Medicare Other | Admitting: Infectious Disease

## 2012-12-15 ENCOUNTER — Inpatient Hospital Stay (HOSPITAL_COMMUNITY): Payer: Medicare Other

## 2012-12-15 ENCOUNTER — Ambulatory Visit (HOSPITAL_COMMUNITY): Admission: RE | Admit: 2012-12-15 | Payer: Medicare Other | Source: Ambulatory Visit

## 2012-12-15 HISTORY — PX: PERIPHERALLY INSERTED CENTRAL CATHETER INSERTION: SHX2221

## 2012-12-15 LAB — BASIC METABOLIC PANEL
BUN: 18 mg/dL (ref 6–23)
CO2: 23 mEq/L (ref 19–32)
Calcium: 8.6 mg/dL (ref 8.4–10.5)
GFR calc non Af Amer: 55 mL/min — ABNORMAL LOW (ref >90)
Glucose, Bld: 98 mg/dL (ref 70–99)
Sodium: 134 mEq/L — ABNORMAL LOW (ref 135–145)

## 2012-12-15 LAB — PROTIME-INR: Prothrombin Time: 20.5 seconds — ABNORMAL HIGH (ref 11.6–15.2)

## 2012-12-15 MED ORDER — GENTAMICIN IN SALINE 1.2-0.9 MG/ML-% IV SOLN: 60.0000 mg | Freq: Two times a day (BID) | INTRAVENOUS | Status: DC

## 2012-12-15 MED ORDER — WARFARIN SODIUM 4 MG PO TABS
4.0000 mg | ORAL_TABLET | Freq: Once | ORAL | Status: DC
  Filled 2012-12-15: qty 1

## 2012-12-15 MED ORDER — SODIUM CHLORIDE 0.9 % IV SOLN: 2.0000 g | INTRAVENOUS | Status: DC

## 2012-12-15 NOTE — Evaluation (Signed)
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  Pt to be on long term antibiotics.  Pt had PICC line placed this morning and will be d/c'd this afternoon.   Clinical Impression   Pt admitted with above diagnosis and will be d/c'd home with daughter this afternoon.  Pt has no OT needs at this time.    OT Assessment  Patient does not need any further OT services    Follow Up Recommendations  No OT follow up    Barriers to Discharge      Equipment Recommendations  None recommended by OT    Recommendations for Other Services    Frequency       Precautions / Restrictions Precautions Precautions: None Restrictions Weight Bearing Restrictions: No   Pertinent Vitals/Pain Pt with mild soreness in L arm from new PICC line placement.    ADL  Eating/Feeding: Performed;Independent Where Assessed - Eating/Feeding: Edge of bed Grooming: Simulated;Set up Where Assessed - Grooming: Unsupported standing Upper Body Bathing: Performed;Set up Where Assessed - Upper Body Bathing: Unsupported sitting Lower Body Bathing: Simulated;Set up Where Assessed - Lower Body Bathing: Unsupported sit to stand Upper Body Dressing: Set up;Simulated Where Assessed - Upper Body Dressing: Unsupported sitting Lower Body Dressing: Performed;Set up Where Assessed - Lower Body Dressing: Unsupported sit to stand Toilet Transfer: Performed;Set up Toilet Transfer Method:  (walking to br) Toilet Transfer Equipment: Comfort height toilet Toileting - Clothing Manipulation and Hygiene: Simulated;Set up Where Assessed - Toileting Clothing Manipulation and Hygiene: Standing Equipment Used: Gait belt Transfers/Ambulation  Related to ADLs: Pt walked in hallway and in room with S ADL Comments: Pt S wit all adls.    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  Pt to be on long term antibiotics.  Pt had PICC line placed this morning and will be d/c'd this afternoon.       Prior Functioning     Home Living Family/patient expects to be discharged to:: Private residence Living Arrangements: Children Available Help at Discharge: Family Type of Home: House Home Access: Ramped entrance Home Layout: One level Home Equipment: Environmental consultant - 2 wheels;Bedside commode;Shower seat;Wheelchair - manual Additional Comments: plans to d/c to daughter's house with intention to eventually return home alone Prior Function Level of Independence: Independent Communication Communication: No difficulties Dominant Hand: Right         Vision/Perception     Cognition  Cognition Arousal/Alertness: Awake/alert Behavior During Therapy: WFL for tasks assessed/performed Overall Cognitive Status: Within Functional Limits for tasks assessed    Extremity/Trunk Assessment Upper Extremity Assessment Upper Extremity Assessment: Overall WFL for tasks assessed Lower Extremity Assessment Lower Extremity Assessment: Defer to PT evaluation Cervical / Trunk Assessment Cervical / Trunk Assessment: Normal     Mobility Bed Mobility Bed Mobility: Supine to Sit;Sitting - Scoot to Edge of Bed Supine to Sit: 6: Modified independent (Device/Increase  time) Sitting - Scoot to Edge of Bed: 6: Modified independent (Device/Increase time) Details for Bed Mobility Assistance: no  assist necessary Transfers Transfers: Sit to Stand;Stand to Sit Sit to Stand: 6: Modified independent (Device/Increase time) Stand to Sit: 6: Modified independent (Device/Increase time) Details for Transfer Assistance: no assist needed.     Exercise     Balance Balance Balance Assessed: No   End of Session OT - End of Session Equipment Utilized During Treatment: Gait belt Activity Tolerance: Patient tolerated treatment well Patient left: in bed;with call bell/phone within reach Nurse Communication: Mobility status  GO     Hope Budds 12/15/2012, 10:22 AM [phone]

## 2012-12-15 NOTE — Progress Notes (Signed)
[facility]  Patient Status:  [name] is active with [facility] for DME upon this readmission.  She has had [facility] HH and Pharmacy services in the past.   [facility] is providing the following services:   HH RN and Home Infusion Pharmacy services for home IV ABX.  [facility] hospital coordinators will follow pt  and support transition home today for home IV ABX.  [facility] hospital IV coordinator has met with pt to confirm plan for [facility] to administer 4PM dose today at home. RN to call to confirm exact visit time.  If patient discharges after hours, please call [phone].   Sedalia Muta 12/15/2012, 11:09 AM

## 2012-12-15 NOTE — Discharge Summary (Signed)
Physician Discharge Summary  [name]:[id number] DOB: 08-28-1940 DOA: 12/12/2012  PCP: [name], MD  Admit date: 12/12/2012 Discharge date: 12/15/2012  Time spent: 40 minutes  Recommendations for Outpatient Follow-up:  1. Followup with primary care physician and ID in one week. 2. Home health services to draw gentamicin level and BMP on Wednesday, please send results to Dr. Daiva Eves.  Discharge Diagnoses:  Active Problems:   RESTLESS LEG SYNDROME, MILD   CORONARY ARTERY DISEASE   Atrial fibrillation   Postinflammatory pulmonary fibrosis   GERD   MIXED CONNECTIVE TISSUE DISEASE   Bacteremia   Protein-calorie malnutrition, severe   Discharge Condition: Stable  Diet recommendation: Heart healthy diet  Filed Weights   12/12/12 1315 12/12/12 1714  Weight: 68.629 kg (151 lb 4.8 oz) 69.264 kg (152 lb 11.2 oz)    History of present illness:  [name] is a 72 y.o. female with a history of 2 previous episodes of endocarditis, prosthetic mitral valve, Lupus, Pulmonary fibrosis, Afib, NSTEMI, HLD and CAD. She was taken to [facility] yesterday by her daughter who noticing that the patient was becoming increasingly fatigued over the past week. Her appetite has been poor. The daughter comments that the 2 previous episodes of endocarditis started this way. Otherwise, the patient has no complaints. She feels generally well. She complains of chronic insomnia - but no fever, nausea, changes in bowel habits, dysuria, chest pain, palpitations, or shortness of breath.  Hospital Course:   1. 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 Patient presented to the hospital with fever and chills and positive blood cultures. Blood culture grew enterococcus. Patient upon admission was started on vancomycin and Primaxin. After he was seen by infectious disease it was switched to ampicillin and gentamicin. Antibiotics  should be continued for a total of 6 weeks of therapy. Patient did not have TEE or evaluation by cardiology because she is deemed last time she had endocarditis is not a surgical candidate. This is her third episode of endocarditis.   2. Lupus with pulmonary fibrosis: Patient is immunosuppressive medications, appears to be stable no changes were done during this hospital stay.  3. Atrial fibrillation: Patient is on chronic Coumadin therapy, her rate is controlled with digoxin.  4. Severe protein calorie malnutrition: Secondary to her chronic illness, raising the edition so the patient in the hospital recommended appropriate diet.  5. Enterococcus bacteremia: With resultant endocarditis, susceptibility showed ampicillin sensitive. Patient is getting treatment with ampicillin and gentamicin.  Procedures:  Placement of PICC placed by interventional radiology on 12/15/2012.  Consultations:  Gwen Her Dam of the infectious disease service  Discharge Exam: Filed Vitals:   12/15/12 1021  BP:   Pulse: 62  Temp:   Resp:    General: Alert and awake, oriented x3, not in any acute distress. HEENT: anicteric sclera, pupils reactive to light and accommodation, EOMI CVS: S1-S2 clear, no murmur rubs or gallops Chest: clear to auscultation bilaterally, no wheezing, rales or rhonchi Abdomen: soft nontender, nondistended, normal bowel sounds, no organomegaly Extremities: no cyanosis, clubbing or edema noted bilaterally Neuro: Cranial nerves II-XII intact, no focal neurological deficits  Discharge Instructions  Discharge Orders   Future Appointments Provider Department Dept Phone   12/16/2012 4:00 PM Randall Hiss, MD [facility] for Infectious Disease [phone]   Future Orders Complete By Expires   Increase activity slowly  As directed  Medication List    STOP taking these medications       metolazone 2.5 MG tablet  Commonly known as:  ZAROXOLYN      TAKE  these medications       digoxin 0.125 MG tablet  Commonly known as:  LANOXIN  Take 0.125 mg by mouth every morning.     furosemide 40 MG tablet  Commonly known as:  LASIX  Take 40 mg by mouth every morning.     gentamicin 1.2-0.9 MG/ML-%  Commonly known as:  GARAMYCIN  Inject 50 mLs (60 mg total) into the vein every 12 (twelve) hours.     hydroxychloroquine 200 MG tablet  Commonly known as:  PLAQUENIL  Take 200 mg by mouth 2 (two) times daily.     metoprolol 50 MG tablet  Commonly known as:  LOPRESSOR  Take 0.5 tablets (25 mg total) by mouth 2 (two) times daily.     pantoprazole 40 MG tablet  Commonly known as:  PROTONIX  Take 40 mg by mouth every morning.     potassium chloride SA 20 MEQ tablet  Commonly known as:  K-DUR,KLOR-CON  Take 40-60 mEq by mouth 2 (two) times daily. Takes 2 tablets every morning and takes 3 tablets every evening.     predniSONE 5 MG tablet  Commonly known as:  DELTASONE  Take 1 tablet (5 mg total) by mouth every morning.     rOPINIRole 1 MG tablet  Commonly known as:  REQUIP  Take 1 mg by mouth at bedtime.     rosuvastatin 10 MG tablet  Commonly known as:  CRESTOR  Take 10 mg by mouth every morning.     sodium chloride 0.9 % SOLN 50 mL with ampicillin 2 G SOLR 2 g  Inject 2 g into the vein every 4 (four) hours.     sulfaSALAzine 500 MG tablet  Commonly known as:  AZULFIDINE  Take 1,000 mg by mouth 2 (two) times daily.     warfarin 3 MG tablet  Commonly known as:  COUMADIN  Take 3 mg by mouth every morning.       Allergies  Allergen Reactions  . Ceftriaxone     RASH BUT TOLERATED AMPICILLIN AND IMIPENEM WITHOUT PROBLEMS  . Codeine Nausea Only       Follow-up Information   Follow up with REED, TIFFANY, DO In 1 week.   Specialty:  Geriatric Medicine   Contact information:   [address] [place] [address] [phone]       Follow up with [name], MD In 1 week.   Specialty:  Infectious Diseases   Contact  information:   [address] [place] [address] [phone]        The results of significant diagnostics from this hospitalization (including imaging, microbiology, ancillary and laboratory) are listed below for reference.    Significant Diagnostic Studies: Ir Fluoro Guide Cv Line Right  12/15/2012   *RADIOLOGY REPORT*  PICC PLACEMENT WITH ULTRASOUND AND FLUOROSCOPIC  GUIDANCE  Clinical History: 72 year old female with endocarditis in need of durable central venous access for outpatient antibiotic therapy.  Fluoroscopy Time: 6 seconds  Procedure:  The right arm was prepped with chlorhexidine, draped in the usual sterile fashion using maximum barrier technique (cap and mask, sterile gown, sterile gloves, large sterile sheet, hand hygiene and cutaneous antiseptic).  Local anesthesia was attained by infiltration with 1% lidocaine.  Ultrasound demonstrated patency of the right basilic  vein, and this was documented with an image.  Under real-time ultrasound guidance, this vein was accessed with a 21 gauge micropuncture needle and image documentation was performed.  The needle was exchanged over a guidewire for a peel-away sheath through which a 41 cm 5 [place] dual lumen power injectable PICC was advanced, and positioned with its tip at the lower SVC/right atrial junction.  Fluoroscopy during the procedure and fluoro spot radiograph confirms appropriate catheter position.  The catheter was flushed, secured to the skin with Prolene sutures, and covered with a sterile dressing.  Complications:  None.  The patient tolerated the procedure well.  IMPRESSION:  Successful placement of a right basilic vein approach dual lumen power PICC with sonographic and fluoroscopic guidance.  The catheter is ready for use.  Signed,  Sterling Big, MD [facility] Radiology   Original Report Authenticated By: Malachy Moan, M.D.   Ir US Guide  Vasc Access Right  12/15/2012   *RADIOLOGY REPORT*  PICC PLACEMENT WITH ULTRASOUND AND FLUOROSCOPIC  GUIDANCE  Clinical History: 72 year old female with endocarditis in need of durable central venous access for outpatient antibiotic therapy.  Fluoroscopy Time: 6 seconds  Procedure:  The right arm was prepped with chlorhexidine, draped in the usual sterile fashion using maximum barrier technique (cap and mask, sterile gown, sterile gloves, large sterile sheet, hand hygiene and cutaneous antiseptic).  Local anesthesia was attained by infiltration with 1% lidocaine.  Ultrasound demonstrated patency of the right basilic vein, and this was documented with an image.  Under real-time ultrasound guidance, this vein was accessed with a 21 gauge micropuncture needle and image documentation was performed.  The needle was exchanged over a guidewire for a peel-away sheath through which a 41 cm 5 [place] dual lumen power injectable PICC was advanced, and positioned with its tip at the lower SVC/right atrial junction.  Fluoroscopy during the procedure and fluoro spot radiograph confirms appropriate catheter position.  The catheter was flushed, secured to the skin with Prolene sutures, and covered with a sterile dressing.  Complications:  None.  The patient tolerated the procedure well.  IMPRESSION:  Successful placement of a right basilic vein approach dual lumen power PICC with sonographic and fluoroscopic guidance.  The catheter is ready for use.  Signed,  Sterling Big, MD [facility] Radiology   Original Report Authenticated By: Malachy Moan, M.D.    Microbiology: Recent Results (from the past 240 hour(s))  CULTURE, BLOOD (SINGLE)     Status: None   Collection Time    12/09/12 11:40 AM      Result Value Range Status   Organism ID, Bacteria ENTEROCOCCUS SPECIES   Final   Comment: Gram Stain Report Called to,Read Back By     and Verified With:     DR VAN DAM  1115 12/12/12 BY KRAWS     Susceptibilities performed on previous culture     within the last 5 days.  CULTURE, BLOOD (SINGLE)     Status: None   Collection Time    12/09/12 11:40 AM      Result Value Range Status   Culture ENTEROCOCCUS SPECIES   Final   Organism ID, Bacteria ENTEROCOCCUS SPECIES   Final   Comment: Corrected results called to:     DR VAN DAM 1115 12/12/12 BY KRAWS     Previously reported as Gram Negative Coccobacilli     Combination Therapy of high-dose Ampicillin or  Vancomycin,plus an aminoglycoside,is usually     indicated for serious Enterococcal infections.     Gram Stain Report Called to,Read Back By     and Verified With:     DR VAN DAM 12/11/12 1015 BY SMITHERSJ  CULTURE, BLOOD (SINGLE)     Status: None   Collection Time    12/11/12  2:28 PM      Result Value Range Status   Preliminary Report Blood Culture received; No Growth to date;   Preliminary   Preliminary Report Culture will be held for 5 days before issuing   Preliminary   Preliminary Report a Final Negative report.   Preliminary  CULTURE, BLOOD (SINGLE)     Status: None   Collection Time    12/11/12  2:28 PM      Result Value Range Status   Preliminary Report Blood Culture received; No Growth to date;   Preliminary   Preliminary Report Culture will be held for 5 days before issuing   Preliminary   Preliminary Report a Final Negative report.   Preliminary  CULTURE, BLOOD (ROUTINE X 2)     Status: None   Collection Time    12/13/12  8:30 AM      Result Value Range Status   Specimen Description BLOOD LEFT ARM   Final   Special Requests BOTTLES DRAWN AEROBIC AND ANAEROBIC 10CC   Final   Culture  Setup Time     Final   Value: 12/14/2012 13:27     Performed at [facility]   Culture     Final   Value:        BLOOD CULTURE RECEIVED NO GROWTH TO DATE CULTURE WILL BE HELD FOR 5 DAYS BEFORE ISSUING A FINAL NEGATIVE REPORT     Performed at [facility]   Report Status PENDING    Incomplete  CULTURE, BLOOD (ROUTINE X 2)     Status: None   Collection Time    12/13/12  9:45 AM      Result Value Range Status   Specimen Description BLOOD LEFT ARM   Final   Special Requests BOTTLES DRAWN AEROBIC AND ANAEROBIC 10CC   Final   Culture  Setup Time     Final   Value: 12/14/2012 13:27     Performed at [facility]   Culture     Final   Value:        BLOOD CULTURE RECEIVED NO GROWTH TO DATE CULTURE WILL BE HELD FOR 5 DAYS BEFORE ISSUING A FINAL NEGATIVE REPORT     Performed at [facility]   Report Status PENDING   Incomplete     Labs: Basic Metabolic Panel:  Recent Labs Lab 12/11/12 1428 12/12/12 1357 12/13/12 0418 12/14/12 1105 12/15/12 0500  NA 134* 132* 138 134* 134*  K 3.7 3.4* 3.4* 2.9* 4.0  CL 98 96 102 99 102  CO2 24 23 26 28 23   GLUCOSE 98 119* 89 105* 98  BUN 27* 31* 27* 19 18  CREATININE 1.21* 1.12* 1.04 0.88 0.99  CALCIUM 8.9 9.0 8.6 8.7 8.6   Liver Function Tests:  Recent Labs Lab 12/09/12 1146 12/11/12 1428 12/12/12 1357  AST 16 13 14   ALT <8 <8 5  ALKPHOS 109 98 116  BILITOT 0.6 0.8 0.5  PROT 7.5 7.2 7.5  ALBUMIN 3.5 3.3* 3.1*   No results found for this basename: LIPASE, AMYLASE,  in the last 168 hours No results found for this basename:  AMMONIA,  in the last 168 hours CBC:  Recent Labs Lab 12/09/12 1146 12/11/12 1428 12/12/12 1357 12/13/12 0418  WBC 7.7 7.5 7.3 5.7  NEUTROABS  --  5.3  --   --   HGB 11.6* 10.9* 10.5* 9.7*  HCT 33.9* 31.6* 31.2* 28.6*  MCV 90.4 88.3 89.7 89.7  PLT 167 148* 124* 121*   Cardiac Enzymes:  Recent Labs Lab 12/13/12 1704 12/14/12 0531  CKTOTAL 33 27   BNP: BNP (last 3 results)  Recent Labs  07/11/12 1901 07/14/12 1651  PROBNP 12830.0* 5791.0*   CBG: No results found for this basename: GLUCAP,  in the last 168 hours     Signed:  Corben Auzenne A  [facility] 12/15/2012, 2:02 PM

## 2012-12-15 NOTE — Care Management Note (Unsigned)
    Page 1 of 2   12/15/2012     10:21:35 AM   CARE MANAGEMENT NOTE 12/15/2012  Patient:  [name],[name]   Account Number:  [id number]  Date Initiated:  12/15/2012  Documentation initiated by:  Letha Cape  Subjective/Objective Assessment:   dx bacteremia  lives with daughter.     Action/Plan:   HH IV ABX  pt eval-   Anticipated DC Date:  12/15/2012   Anticipated DC Plan:  HOME W HOME HEALTH SERVICES      DC Planning Services  CM consult      [facility] Choice  HOME HEALTH   Choice offered to / List presented to:  [name]        HH arranged  HH-1 RN      [facility] agency  [facility]   Status of service:  Completed, signed off Medicare Important Message given?   (If response is "NO", the following Medicare IM given date fields will be blank) Date Medicare IM given:   Date Additional Medicare IM given:    Discharge Disposition:  HOME W HOME HEALTH SERVICES  Per UR Regulation:  Reviewed for med. necessity/level of care/duration of stay  If discussed at Long Length of Stay Meetings, dates discussed:    Comments:  12/15/12 10:06 Letha Cape RN, BSN [phone] patient lives alone, but will be staying with daughter at dc.  Patient states she has had [facility] in the past and would like to work with them again.  Referral made to [facility], Marie notified, for [facility] for IV ABX.  Soc will begin 24-48 hrs post discharge.  Await pt eval.

## 2012-12-15 NOTE — Procedures (Signed)
Interventional Radiology Procedure Note  Procedure: Placement of a Right basilic vein dual lumen PowerPICC.  Catheter tip at superior CAJ and ready for use. Complications: none Recommendations: - Routine line care  Signed,  Sterling Big, MD Vascular & Interventional Radiologist [facility] Radiology

## 2012-12-15 NOTE — Evaluation (Signed)
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  Pt to be on long term antibiotics.  Pt had PICC line placed this morning and will be d/c'd this afternoon.  Clinical Impression  Pt very fatigued from procedure this morning. Pt states she feels her daughter will provide enough assistance at home and requests that HHPT does not follow up with her. Pt did well during therapy eval with min guard necessary for ambulation and modified independence during transfers. This patient is at reported baseline of function and does not require or request further skilled PT interventions at this time. If needs change, please re-consult.    PT Assessment  Patent does not need any further PT services    Follow Up Recommendations  No PT follow up (Pt refuses HHPT services at home)    Does the patient have the potential to tolerate intense rehabilitation      Barriers to Discharge        Equipment Recommendations       Recommendations for Other Services     Frequency      Precautions / Restrictions Precautions Precautions: None Restrictions Weight Bearing Restrictions: No   Pertinent Vitals/Pain Pt reports mild soreness in location of PICC line placement, but has no further complaints during session.      Mobility  Bed Mobility Bed Mobility: Supine to Sit;Sitting - Scoot to Edge of Bed;Sit to Supine Supine to Sit: 6: Modified independent (Device/Increase time);With rails Sitting - Scoot to Edge of Bed: 6: Modified independent (Device/Increase time);With rail Sit to Supine: 6: Modified independent (Device/Increase time);With rail;HOB flat Details for Bed Mobility Assistance: Pt required no physical assist from therapist, however  used bed rails for support. Transfers Transfers: Sit to Stand;Stand to Sit Sit to Stand: 6: Modified independent (Device/Increase time);From bed;With upper extremity assist Stand to Sit: 6: Modified independent (Device/Increase time);To bed;With upper extremity assist Details for Transfer Assistance: Pt was able to perform without physical assist from therapist. Ambulation/Gait Ambulation/Gait Assistance: 4: Min guard Ambulation Distance (Feet): 75 Feet Assistive device: 1 person hand held assist Ambulation/Gait Assistance Details: Min guard for gait training with HHA +1. Pt did not use hand hold for heavy support, and did not display any LOB. Gait Pattern: Step-through pattern;Decreased trunk rotation Gait velocity: decreased Stairs: No Wheelchair Mobility Wheelchair Mobility: No    Exercises     PT Diagnosis:    PT Problem List:   PT Treatment Interventions:       PT Goals(Current goals can be found in the care plan section) Acute Rehab PT Goals Patient Stated Goal: to go home this afternoon PT Goal Formulation: With patient Time For Goal Achievement: 12/22/12 Potential to Achieve Goals: Good  Visit Information  Last PT Received On: 12/15/12 Assistance Needed: +1 PT/OT Co-Evaluation/Treatment: Yes History of Present Illness: Pt admitted with 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  Pt to be on long term antibiotics.  Pt had PICC line placed this morning and will be d/c'd this afternoon.       Prior Functioning  Home Living Family/patient expects to be discharged to:: Private residence Living Arrangements: Children Available Help at Discharge: Family Type of Home: House Home Access: Ramped entrance Home Layout: One level Home Equipment:  Walker - 2 wheels;Bedside commode;Shower seat;Wheelchair - manual Additional Comments: Pt plans to d/c to daughter's home, with the hope of eventually returning to her apt alone. Prior Function Level of  Independence: Independent Communication Communication: No difficulties Dominant Hand: Right    Cognition  Cognition Arousal/Alertness: Awake/alert Behavior During Therapy: WFL for tasks assessed/performed Overall Cognitive Status: Within Functional Limits for tasks assessed    Extremity/Trunk Assessment Upper Extremity Assessment Upper Extremity Assessment: Defer to OT evaluation Lower Extremity Assessment Lower Extremity Assessment: Overall WFL for tasks assessed (4+/5 strength in bilateral LE's) Cervical / Trunk Assessment Cervical / Trunk Assessment: Normal   Balance Balance Balance Assessed: Yes Static Sitting Balance Static Sitting - Balance Support: Bilateral upper extremity supported;Feet supported Static Sitting - Level of Assistance: 6: Modified independent (Device/Increase time) Static Sitting - Comment/# of Minutes: 3 Static Standing Balance Static Standing - Balance Support: Right upper extremity supported Static Standing - Level of Assistance: 5: Stand by assistance Static Standing - Comment/# of Minutes: 2  End of Session PT - End of Session Equipment Utilized During Treatment: Gait belt Activity Tolerance: Patient limited by fatigue;Patient limited by lethargy Patient left: in bed;with call bell/phone within reach  GP     Ruthann Cancer 12/15/2012, 11:37 AM  Ruthann Cancer, PT Acute Rehabilitation Services

## 2012-12-15 NOTE — Progress Notes (Signed)
ANTICOAGULATION CONSULT NOTE - Follow Up Consult  Pharmacy Consult for Warfarin Indication: Afib  Allergies  Allergen Reactions  . Ceftriaxone     RASH BUT TOLERATED AMPICILLIN AND IMIPENEM WITHOUT PROBLEMS  . Codeine Nausea Only    Patient Measurements: Height: 5' 7.5" (171.5 cm) Weight: 152 lb 11.2 oz (69.264 kg) IBW/kg (Calculated) : 62.75  Vital Signs: Temp: 99.8 F (37.7 C) (09/22 0524) Temp src: Oral (09/22 0524) BP: 137/73 mmHg (09/22 1019) Pulse Rate: 62 (09/22 1021)  Labs:  Recent Labs  12/12/12 1357  12/13/12 0418 12/13/12 0945 12/13/12 1704 12/14/12 0531 12/14/12 1105 12/15/12 0500  HGB 10.5*  --  9.7*  --   --   --   --   --   HCT 31.2*  --  28.6*  --   --   --   --   --   PLT 124*  --  121*  --   --   --   --   --   LABPROT  --   < >  --  21.1*  --  21.6*  --  20.5*  INR  --   < >  --  1.89*  --  1.94*  --  1.82*  CREATININE 1.12*  --  1.04  --   --   --  0.88 0.99  CKTOTAL  --   --   --   --  33 27  --   --   < > = values in this interval not displayed.  Estimated Creatinine Clearance: 49.4 ml/min (by C-G formula based on Cr of 0.99).   Medications:  Scheduled:  . ampicillin (OMNIPEN) IV  2 g Intravenous Q4H  . antiseptic oral rinse  15 mL Mouth Rinse BID  . digoxin  0.125 mg Oral q morning - 10a  . feeding supplement  1 Container Oral TID BM  . gentamicin  60 mg Intravenous Q12H  . hydroxychloroquine  200 mg Oral BID  . metoprolol tartrate  12.5 mg Oral BID  . pantoprazole  40 mg Oral q morning - 10a  . potassium chloride  40 mEq Oral Daily  . potassium chloride  60 mEq Oral QHS  . predniSONE  5 mg Oral q morning - 10a  . rOPINIRole  1 mg Oral QHS  . sodium chloride  3 mL Intravenous Q12H  . sodium chloride  3 mL Intravenous Q12H  . sulfaSALAzine  1,000 mg Oral BID  . Warfarin - Pharmacist Dosing Inpatient   Does not apply q1800    Assessment: 72 yo female on chronic Coumadin for afib admitted for possible recurrence of endocarditis.  INR remains subtherapeutic and trended down 1.94>1.82. Will repeat coumadin 4 mg once. No bleeding complications noted.  Goal of Therapy:  INR 2-3 Monitor platelets by anticoagulation protocol: Yes   Plan:  - Warfarin 4mg  x1, plan to restart home dose if therapeutic tomorrow.  - F/u on PT/INR and signs of bleed  Vinnie Level, PharmD.  [place] License #[id number] Application for [place] reciprocity pending  Clinical Pharmacist Pager [phone]

## 2012-12-15 NOTE — Progress Notes (Signed)
Charlann Lange to be D/C'd Home with HH antibiotics through PICC line per MD order.  Discussed with the patient and all questions fully answered.    Medication List    STOP taking these medications       metolazone 2.5 MG tablet  Commonly known as:  ZAROXOLYN      TAKE these medications       digoxin 0.125 MG tablet  Commonly known as:  LANOXIN  Take 0.125 mg by mouth every morning.     furosemide 40 MG tablet  Commonly known as:  LASIX  Take 40 mg by mouth every morning.     gentamicin 1.2-0.9 MG/ML-%  Commonly known as:  GARAMYCIN  Inject 50 mLs (60 mg total) into the vein every 12 (twelve) hours.     hydroxychloroquine 200 MG tablet  Commonly known as:  PLAQUENIL  Take 200 mg by mouth 2 (two) times daily.     metoprolol 50 MG tablet  Commonly known as:  LOPRESSOR  Take 0.5 tablets (25 mg total) by mouth 2 (two) times daily.     pantoprazole 40 MG tablet  Commonly known as:  PROTONIX  Take 40 mg by mouth every morning.     potassium chloride SA 20 MEQ tablet  Commonly known as:  K-DUR,KLOR-CON  Take 40-60 mEq by mouth 2 (two) times daily. Takes 2 tablets every morning and takes 3 tablets every evening.     predniSONE 5 MG tablet  Commonly known as:  DELTASONE  Take 1 tablet (5 mg total) by mouth every morning.     rOPINIRole 1 MG tablet  Commonly known as:  REQUIP  Take 1 mg by mouth at bedtime.     rosuvastatin 10 MG tablet  Commonly known as:  CRESTOR  Take 10 mg by mouth every morning.     sodium chloride 0.9 % SOLN 50 mL with ampicillin 2 G SOLR 2 g  Inject 2 g into the vein every 4 (four) hours.     sulfaSALAzine 500 MG tablet  Commonly known as:  AZULFIDINE  Take 1,000 mg by mouth 2 (two) times daily.     warfarin 3 MG tablet  Commonly known as:  COUMADIN  Take 3 mg by mouth every morning.        VVS, Skin clean, dry and intact without evidence of skin break down, no evidence of skin tears noted. IV catheter discontinued intact. Site  without signs and symptoms of complications. Dressing and pressure applied.  An After Visit Summary was printed and given to the patient. Follow up appointments , new prescriptions and medication administration times given. Handout given on Bacteremia and education done. Patient escorted via WC, and D/C home via private auto.  Cindra Eves, RN 12/15/2012 3:25 PM

## 2012-12-16 ENCOUNTER — Ambulatory Visit: Payer: Medicare Other | Admitting: Infectious Disease

## 2012-12-16 ENCOUNTER — Encounter (HOSPITAL_COMMUNITY): Payer: Self-pay | Admitting: Emergency Medicine

## 2012-12-16 ENCOUNTER — Inpatient Hospital Stay (HOSPITAL_COMMUNITY)
Admission: EM | Admit: 2012-12-16 | Discharge: 2012-12-20 | DRG: 811 | Disposition: A | Discharge status: Home / Self Care | Payer: Medicare Other | Attending: Internal Medicine | Admitting: Internal Medicine

## 2012-12-16 ENCOUNTER — Emergency Department (HOSPITAL_COMMUNITY): Payer: Medicare Other

## 2012-12-16 DIAGNOSIS — J189 Pneumonia, unspecified organism: Secondary | ICD-10-CM

## 2012-12-16 DIAGNOSIS — T826XXD Infection and inflammatory reaction due to cardiac valve prosthesis, subsequent encounter: Secondary | ICD-10-CM

## 2012-12-16 DIAGNOSIS — I330 Acute and subacute infective endocarditis: Secondary | ICD-10-CM | POA: Diagnosis present

## 2012-12-16 DIAGNOSIS — T826XXA Infection and inflammatory reaction due to cardiac valve prosthesis, initial encounter: Secondary | ICD-10-CM | POA: Diagnosis present

## 2012-12-16 DIAGNOSIS — N289 Disorder of kidney and ureter, unspecified: Secondary | ICD-10-CM | POA: Diagnosis present

## 2012-12-16 DIAGNOSIS — I2510 Atherosclerotic heart disease of native coronary artery without angina pectoris: Secondary | ICD-10-CM | POA: Diagnosis present

## 2012-12-16 DIAGNOSIS — E785 Hyperlipidemia, unspecified: Secondary | ICD-10-CM | POA: Diagnosis present

## 2012-12-16 DIAGNOSIS — G4700 Insomnia, unspecified: Secondary | ICD-10-CM | POA: Diagnosis present

## 2012-12-16 DIAGNOSIS — I10 Essential (primary) hypertension: Secondary | ICD-10-CM | POA: Diagnosis present

## 2012-12-16 DIAGNOSIS — T827XXA Infection and inflammatory reaction due to other cardiac and vascular devices, implants and grafts, initial encounter: Secondary | ICD-10-CM | POA: Diagnosis present

## 2012-12-16 DIAGNOSIS — Z7901 Long term (current) use of anticoagulants: Secondary | ICD-10-CM

## 2012-12-16 DIAGNOSIS — M329 Systemic lupus erythematosus, unspecified: Secondary | ICD-10-CM | POA: Diagnosis present

## 2012-12-16 DIAGNOSIS — Y831 Surgical operation with implant of artificial internal device as the cause of abnormal reaction of the patient, or of later complication, without mention of misadventure at the time of the procedure: Secondary | ICD-10-CM | POA: Diagnosis present

## 2012-12-16 DIAGNOSIS — R112 Nausea with vomiting, unspecified: Secondary | ICD-10-CM | POA: Diagnosis present

## 2012-12-16 DIAGNOSIS — D638 Anemia in other chronic diseases classified elsewhere: Principal | ICD-10-CM | POA: Diagnosis present

## 2012-12-16 DIAGNOSIS — I38 Endocarditis, valve unspecified: Secondary | ICD-10-CM

## 2012-12-16 DIAGNOSIS — M359 Systemic involvement of connective tissue, unspecified: Secondary | ICD-10-CM

## 2012-12-16 DIAGNOSIS — B952 Enterococcus as the cause of diseases classified elsewhere: Secondary | ICD-10-CM | POA: Diagnosis present

## 2012-12-16 DIAGNOSIS — R197 Diarrhea, unspecified: Secondary | ICD-10-CM | POA: Diagnosis present

## 2012-12-16 DIAGNOSIS — K219 Gastro-esophageal reflux disease without esophagitis: Secondary | ICD-10-CM | POA: Diagnosis present

## 2012-12-16 DIAGNOSIS — R0989 Other specified symptoms and signs involving the circulatory and respiratory systems: Secondary | ICD-10-CM | POA: Diagnosis not present

## 2012-12-16 DIAGNOSIS — R195 Other fecal abnormalities: Secondary | ICD-10-CM

## 2012-12-16 DIAGNOSIS — R21 Rash and other nonspecific skin eruption: Secondary | ICD-10-CM

## 2012-12-16 DIAGNOSIS — Z951 Presence of aortocoronary bypass graft: Secondary | ICD-10-CM

## 2012-12-16 DIAGNOSIS — J8410 Pulmonary fibrosis, unspecified: Secondary | ICD-10-CM | POA: Diagnosis present

## 2012-12-16 DIAGNOSIS — D696 Thrombocytopenia, unspecified: Secondary | ICD-10-CM | POA: Diagnosis present

## 2012-12-16 DIAGNOSIS — E875 Hyperkalemia: Secondary | ICD-10-CM | POA: Diagnosis present

## 2012-12-16 DIAGNOSIS — R7881 Bacteremia: Secondary | ICD-10-CM | POA: Diagnosis present

## 2012-12-16 DIAGNOSIS — G2581 Restless legs syndrome: Secondary | ICD-10-CM

## 2012-12-16 DIAGNOSIS — R634 Abnormal weight loss: Secondary | ICD-10-CM

## 2012-12-16 DIAGNOSIS — R82998 Other abnormal findings in urine: Secondary | ICD-10-CM | POA: Diagnosis present

## 2012-12-16 DIAGNOSIS — Z79899 Other long term (current) drug therapy: Secondary | ICD-10-CM

## 2012-12-16 DIAGNOSIS — E860 Dehydration: Secondary | ICD-10-CM | POA: Diagnosis present

## 2012-12-16 DIAGNOSIS — K922 Gastrointestinal hemorrhage, unspecified: Secondary | ICD-10-CM

## 2012-12-16 DIAGNOSIS — D649 Anemia, unspecified: Secondary | ICD-10-CM

## 2012-12-16 DIAGNOSIS — I214 Non-ST elevation (NSTEMI) myocardial infarction: Secondary | ICD-10-CM

## 2012-12-16 DIAGNOSIS — D131 Benign neoplasm of stomach: Secondary | ICD-10-CM

## 2012-12-16 DIAGNOSIS — IMO0002 Reserved for concepts with insufficient information to code with codable children: Secondary | ICD-10-CM | POA: Diagnosis present

## 2012-12-16 DIAGNOSIS — D62 Acute posthemorrhagic anemia: Secondary | ICD-10-CM | POA: Diagnosis present

## 2012-12-16 DIAGNOSIS — I252 Old myocardial infarction: Secondary | ICD-10-CM | POA: Diagnosis present

## 2012-12-16 DIAGNOSIS — E43 Unspecified severe protein-calorie malnutrition: Secondary | ICD-10-CM | POA: Diagnosis present

## 2012-12-16 DIAGNOSIS — I4891 Unspecified atrial fibrillation: Secondary | ICD-10-CM | POA: Diagnosis present

## 2012-12-16 HISTORY — DX: Unspecified nephritic syndrome with unspecified morphologic changes: N05.9

## 2012-12-16 HISTORY — DX: Gastrointestinal hemorrhage, unspecified: K92.2

## 2012-12-16 HISTORY — DX: Unspecified chronic bronchitis: J42

## 2012-12-16 HISTORY — DX: Heart failure, unspecified: I50.9

## 2012-12-16 HISTORY — DX: Cardiac murmur, unspecified: R01.1

## 2012-12-16 HISTORY — DX: Unspecified osteoarthritis, unspecified site: M19.90

## 2012-12-16 HISTORY — DX: Dependence on supplemental oxygen: Z99.81

## 2012-12-16 LAB — CBC WITH DIFFERENTIAL/PLATELET
Basophils Absolute: 0 10*3/uL (ref 0.0–0.1)
Basophils Relative: 0 % (ref 0–1)
Eosinophils Absolute: 0.1 K/uL (ref 0.0–0.7)
Eosinophils Relative: 1 % (ref 0–5)
HCT: 26.5 % — ABNORMAL LOW (ref 36.0–46.0)
Hemoglobin: 8.9 g/dL — ABNORMAL LOW (ref 12.0–15.0)
Lymphocytes Relative: 13 % (ref 12–46)
Lymphs Abs: 1.2 K/uL (ref 0.7–4.0)
MCH: 30.3 pg (ref 26.0–34.0)
MCHC: 33.6 g/dL (ref 30.0–36.0)
MCV: 90.1 fL (ref 78.0–100.0)
Monocytes Absolute: 0.9 K/uL (ref 0.1–1.0)
Monocytes Relative: 10 % (ref 3–12)
Neutro Abs: 6.7 10*3/uL (ref 1.7–7.7)
Neutrophils Relative %: 76 % (ref 43–77)
Platelets: 131 10*3/uL — ABNORMAL LOW (ref 150–400)
RBC: 2.94 MIL/uL — ABNORMAL LOW (ref 3.87–5.11)
RDW: 14.4 % (ref 11.5–15.5)
WBC: 8.8 10*3/uL (ref 4.0–10.5)

## 2012-12-16 LAB — COMPREHENSIVE METABOLIC PANEL
ALT: 6 U/L (ref 0–35)
AST: 21 U/L (ref 0–37)
Alkaline Phosphatase: 112 U/L (ref 39–117)
CO2: 20 mEq/L (ref 19–32)
Calcium: 8.8 mg/dL (ref 8.4–10.5)
GFR calc Af Amer: 55 mL/min — ABNORMAL LOW (ref >90)
GFR calc non Af Amer: 47 mL/min — ABNORMAL LOW (ref >90)
Sodium: 132 mEq/L — ABNORMAL LOW (ref 135–145)

## 2012-12-16 LAB — PROTIME-INR
INR: 1.68 — ABNORMAL HIGH (ref 0.00–1.49)
Prothrombin Time: 19.3 seconds — ABNORMAL HIGH (ref 11.6–15.2)

## 2012-12-16 LAB — BASIC METABOLIC PANEL
BUN: 15 mg/dL (ref 6–23)
Chloride: 103 mEq/L (ref 96–112)
GFR calc Af Amer: 60 mL/min — ABNORMAL LOW (ref >90)
GFR calc non Af Amer: 52 mL/min — ABNORMAL LOW (ref >90)
Potassium: 4.5 mEq/L (ref 3.5–5.1)
Sodium: 134 mEq/L — ABNORMAL LOW (ref 135–145)

## 2012-12-16 LAB — APTT: aPTT: 36 s (ref 24–37)

## 2012-12-16 LAB — COMPREHENSIVE METABOLIC PANEL WITH GFR
Albumin: 2.5 g/dL — ABNORMAL LOW (ref 3.5–5.2)
BUN: 17 mg/dL (ref 6–23)
Chloride: 102 meq/L (ref 96–112)
Creatinine, Ser: 1.12 mg/dL — ABNORMAL HIGH (ref 0.50–1.10)
Glucose, Bld: 108 mg/dL — ABNORMAL HIGH (ref 70–99)
Potassium: 5.4 meq/L — ABNORMAL HIGH (ref 3.5–5.1)
Total Bilirubin: 0.6 mg/dL (ref 0.3–1.2)
Total Protein: 6.9 g/dL (ref 6.0–8.3)

## 2012-12-16 LAB — CULTURE, BLOOD (SINGLE)

## 2012-12-16 LAB — LIPASE, BLOOD: Lipase: 64 U/L — ABNORMAL HIGH (ref 11–59)

## 2012-12-16 LAB — OCCULT BLOOD, POC DEVICE: Fecal Occult Bld: POSITIVE — AB

## 2012-12-16 LAB — DIGOXIN LEVEL: Digoxin Level: 0.7 ng/mL — ABNORMAL LOW (ref 0.8–2.0)

## 2012-12-16 MED ORDER — PANTOPRAZOLE SODIUM 40 MG IV SOLR
40.0000 mg | Freq: Once | INTRAVENOUS | Status: AC
  Administered 2012-12-16: 17:00:00 40 mg via INTRAVENOUS
  Filled 2012-12-16: qty 40

## 2012-12-16 MED ORDER — SODIUM CHLORIDE 0.9 % IV BOLUS (SEPSIS)
500.0000 mL | Freq: Once | INTRAVENOUS | Status: AC
  Administered 2012-12-16: 500 mL via INTRAVENOUS

## 2012-12-16 MED ORDER — SODIUM CHLORIDE 0.9 % IV SOLN
INTRAVENOUS | Status: DC
  Administered 2012-12-16: 23:00:00 via INTRAVENOUS

## 2012-12-16 MED ORDER — HYDROXYCHLOROQUINE SULFATE 200 MG PO TABS
200.0000 mg | ORAL_TABLET | Freq: Two times a day (BID) | ORAL | Status: DC
  Administered 2012-12-16 – 2012-12-20 (×8): 200 mg via ORAL
  Filled 2012-12-16 (×10): qty 1

## 2012-12-16 MED ORDER — SODIUM CHLORIDE 0.9 % IJ SOLN: 10.0000 mL | Freq: Two times a day (BID) | INTRAMUSCULAR | Status: DC

## 2012-12-16 MED ORDER — SULFASALAZINE 500 MG PO TABS
1000.0000 mg | ORAL_TABLET | Freq: Two times a day (BID) | ORAL | Status: DC
  Administered 2012-12-16 – 2012-12-20 (×8): 1000 mg via ORAL
  Filled 2012-12-16 (×9): qty 2

## 2012-12-16 MED ORDER — WARFARIN SODIUM 4 MG PO TABS
4.0000 mg | ORAL_TABLET | Freq: Once | ORAL | Status: AC
  Administered 2012-12-16: 18:00:00 4 mg via ORAL
  Filled 2012-12-16: qty 1

## 2012-12-16 MED ORDER — PREDNISONE 5 MG PO TABS
5.0000 mg | ORAL_TABLET | Freq: Every day | ORAL | Status: DC
  Administered 2012-12-17 – 2012-12-20 (×4): 5 mg via ORAL
  Filled 2012-12-16 (×5): qty 1

## 2012-12-16 MED ORDER — SODIUM CHLORIDE 0.9 % IJ SOLN
10.0000 mL | INTRAMUSCULAR | Status: DC | PRN
  Administered 2012-12-17 – 2012-12-20 (×6): 10 mL

## 2012-12-16 MED ORDER — SODIUM CHLORIDE 0.9 % IV SOLN
2.0000 g | INTRAVENOUS | Status: DC
  Filled 2012-12-16 (×5): qty 2000

## 2012-12-16 MED ORDER — ROPINIROLE HCL 1 MG PO TABS
1.0000 mg | ORAL_TABLET | Freq: Every day | ORAL | Status: DC
  Administered 2012-12-16 – 2012-12-19 (×4): 1 mg via ORAL
  Filled 2012-12-16 (×5): qty 1

## 2012-12-16 MED ORDER — FUROSEMIDE 20 MG PO TABS
20.0000 mg | ORAL_TABLET | Freq: Every day | ORAL | Status: DC
  Administered 2012-12-16 – 2012-12-20 (×5): 20 mg via ORAL
  Filled 2012-12-16 (×5): qty 1

## 2012-12-16 MED ORDER — FUROSEMIDE 40 MG PO TABS: 40.0000 mg | ORAL_TABLET | Freq: Every morning | ORAL | Status: DC

## 2012-12-16 MED ORDER — SODIUM CHLORIDE 0.9 % IV SOLN
2.0000 g | INTRAVENOUS | Status: DC
  Administered 2012-12-16 – 2012-12-20 (×25): 2 g via INTRAVENOUS
  Filled 2012-12-16 (×30): qty 2000

## 2012-12-16 MED ORDER — WARFARIN - PHARMACIST DOSING INPATIENT
Freq: Every day | Status: DC
  Administered 2012-12-16 – 2012-12-20 (×3)

## 2012-12-16 MED ORDER — PANTOPRAZOLE SODIUM 40 MG PO TBEC
40.0000 mg | DELAYED_RELEASE_TABLET | Freq: Every morning | ORAL | Status: DC
  Administered 2012-12-17 – 2012-12-18 (×2): 40 mg via ORAL
  Filled 2012-12-16 (×2): qty 1

## 2012-12-16 MED ORDER — ATORVASTATIN CALCIUM 20 MG PO TABS
20.0000 mg | ORAL_TABLET | Freq: Every day | ORAL | Status: DC
  Administered 2012-12-16 – 2012-12-20 (×5): 20 mg via ORAL
  Filled 2012-12-16 (×5): qty 1

## 2012-12-16 MED ORDER — GENTAMICIN IN SALINE 1.2-0.9 MG/ML-% IV SOLN
60.0000 mg | Freq: Two times a day (BID) | INTRAVENOUS | Status: DC
  Administered 2012-12-16 – 2012-12-17 (×2): 60 mg via INTRAVENOUS
  Filled 2012-12-16 (×4): qty 50

## 2012-12-16 MED ORDER — DIGOXIN 125 MCG PO TABS
0.1250 mg | ORAL_TABLET | Freq: Every morning | ORAL | Status: DC
  Administered 2012-12-16 – 2012-12-20 (×5): 0.125 mg via ORAL
  Filled 2012-12-16 (×6): qty 1

## 2012-12-16 MED ORDER — ONDANSETRON HCL 4 MG/2ML IJ SOLN
4.0000 mg | Freq: Three times a day (TID) | INTRAMUSCULAR | Status: AC | PRN
  Administered 2012-12-16: 19:00:00 4 mg via INTRAVENOUS
  Filled 2012-12-16: qty 2

## 2012-12-16 MED ORDER — PROMETHAZINE HCL 25 MG PO TABS: 12.5000 mg | ORAL_TABLET | Freq: Four times a day (QID) | ORAL | Status: DC | PRN

## 2012-12-16 MED ORDER — ZOLPIDEM TARTRATE 5 MG PO TABS
5.0000 mg | ORAL_TABLET | Freq: Once | ORAL | Status: DC
  Filled 2012-12-16: qty 1

## 2012-12-16 MED ORDER — METOPROLOL TARTRATE 25 MG PO TABS
25.0000 mg | ORAL_TABLET | Freq: Two times a day (BID) | ORAL | Status: DC
  Administered 2012-12-16 – 2012-12-20 (×8): 25 mg via ORAL
  Filled 2012-12-16 (×9): qty 1

## 2012-12-16 MED ORDER — SODIUM CHLORIDE 0.9 % IV SOLN
INTRAVENOUS | Status: DC
  Administered 2012-12-16: 15:00:00 via INTRAVENOUS

## 2012-12-16 MED ORDER — ONDANSETRON HCL 4 MG/2ML IJ SOLN
4.0000 mg | Freq: Once | INTRAMUSCULAR | Status: DC
  Filled 2012-12-16: qty 2

## 2012-12-16 NOTE — ED Notes (Signed)
Pt back from being out of the department; placed pt back on monitor, continuous pulse oximetry, blood pressure cuff and oxygen [place]; family at bedside; RN at bedside

## 2012-12-16 NOTE — ED Provider Notes (Signed)
CSN: [id number]     Arrival date & time 12/16/12  0706 History   First MD Initiated Contact with Patient 12/16/12 [phone]     Chief Complaint  Patient presents with  . Emesis  . Weakness   (Consider location/radiation/quality/duration/timing/severity/associated sxs/prior Treatment) HPI Comments: Pt with 2 weeks of increased lethargy, weakness, was clinically diagnosed with 3rd episode of endocarditis as she is no longer a cardiac surgery candidate, had + blood cultures of same enterococcus as previously, was admitted 9/19-9/22, released yesterday was feeling improved.  In the middle of the night, pt vomited apparently several times in the bed, couldn't specify.    Patient is a 72 y.o. female presenting with vomiting and weakness. The history is provided by the patient, a relative and medical records. The history is limited by the condition of the patient.  Emesis Severity:  Moderate Duration:  2 hours Timing:  Constant Number of daily episodes:  Unable to specify  Context: not post-tussive and not self-induced   Relieved by:  None tried Worsened by:  Nothing tried Associated symptoms: chills, fever and myalgias   Associated symptoms: no abdominal pain and no diarrhea   Weakness Pertinent negatives include no abdominal pain.    Past Medical History  Diagnosis Date  . Coronary artery disease   . Atrial fibrillation     Now NSR  . GERD (gastroesophageal reflux disease)   . Hyperlipidemia   . 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 Anemia   . Angina   . Shortness of breath     with activity  . Hypertension   . Blood transfusion   . Pneumonia   . H/O hiatal hernia   . Lupus   . Insomnia   . Endocarditis   . Prosthetic valve endocarditis   . Enterococcal bacteremia   . Drug rash    Past Surgical History  Procedure Laterality Date  . Cardiac catheterization    . Cardiac valve replacement      2006  . Coronary artery bypass graft    . Givens capsule  study  04/09/2011    Procedure: GIVENS CAPSULE STUDY;  Surgeon: Theda Belfast, MD;  Location: [facility] ENDOSCOPY;  Service: Endoscopy;  Laterality: N/A;  . Tee without cardioversion  02/06/2012    Procedure: TRANSESOPHAGEAL ECHOCARDIOGRAM (TEE);  Surgeon: Ricki Rodriguez, MD;  Location: [facility] ENDOSCOPY;  Service: Cardiovascular;  Laterality: N/A;  . Esophagogastroduodenoscopy  02/12/2012    Procedure: ESOPHAGOGASTRODUODENOSCOPY (EGD);  Surgeon: Theda Belfast, MD;  Location: [facility] ENDOSCOPY;  Service: Endoscopy;  Laterality: N/A;  . Tee without cardioversion N/A 07/15/2012    Procedure: TRANSESOPHAGEAL ECHOCARDIOGRAM (TEE);  Surgeon: Ricki Rodriguez, MD;  Location: [facility] ENDOSCOPY;  Service: Cardiovascular;  Laterality: N/A;  . Breast lumpectomy  1977   Family History  Problem Relation Age of Onset  . Heart disease Mother     CHF  . Mental illness Father     suicide  . Cancer Sister     pancreatic cancer  . COPD Brother    History  Substance Use Topics  . Smoking status: Never Smoker   . Smokeless tobacco: Never Used  . Alcohol Use: No   OB History   Grav Para Term Preterm Abortions TAB SAB Ect Mult Living                 Review of Systems  Unable to perform ROS: Dementia  Constitutional: Positive for chills.  Gastrointestinal: Positive  for nausea and vomiting. Negative for abdominal pain and diarrhea.  Musculoskeletal: Positive for myalgias.  Neurological: Positive for weakness.    Allergies  Ceftriaxone and Codeine  Home Medications   Current Outpatient Rx  Name  Route  Sig  Dispense  Refill  . digoxin (LANOXIN) 0.125 MG tablet   Oral   Take 0.125 mg by mouth every morning.         . furosemide (LASIX) 40 MG tablet   Oral   Take 40 mg by mouth every morning.         Marland Kitchen gentamicin (GARAMYCIN) 1.2-0.9 MG/ML-%   Intravenous   Inject 50 mLs (60 mg total) into the vein every 12 (twelve) hours.   50 mL   0     To be continued for 6 weeks.   . hydroxychloroquine (PLAQUENIL)  200 MG tablet   Oral   Take 200 mg by mouth 2 (two) times daily.          . IRON PO   Oral   Take 1 tablet by mouth 2 (two) times daily.         . metoprolol (LOPRESSOR) 50 MG tablet   Oral   Take 0.5 tablets (25 mg total) by mouth 2 (two) times daily.   60 tablet   3   . pantoprazole (PROTONIX) 40 MG tablet   Oral   Take 40 mg by mouth every morning.         . potassium chloride SA (K-DUR,KLOR-CON) 20 MEQ tablet   Oral   Take 40-60 mEq by mouth 2 (two) times daily. Takes 2 tablets every morning and takes 3 tablets every evening.         . predniSONE (DELTASONE) 5 MG tablet   Oral   Take 1 tablet (5 mg total) by mouth every morning.   30 tablet   2   . rOPINIRole (REQUIP) 1 MG tablet   Oral   Take 1 mg by mouth at bedtime.          . rosuvastatin (CRESTOR) 10 MG tablet   Oral   Take 10 mg by mouth every morning.          . sodium chloride 0.9 % SOLN 50 mL with ampicillin 2 G SOLR 2 g   Intravenous   Inject 2 g into the vein every 4 (four) hours.   2 g   0     To be continued for 6 weeks.   Marland Kitchen sulfaSALAzine (AZULFIDINE) 500 MG tablet   Oral   Take 1,000 mg by mouth 2 (two) times daily.          Marland Kitchen warfarin (COUMADIN) 3 MG tablet   Oral   Take 3 mg by mouth every morning.          BP 112/60  Pulse 70  Temp(Src) 99.7 F (37.6 C) (Rectal)  Resp 26  SpO2 96% Physical Exam  Nursing note and vitals reviewed. Constitutional: She appears well-developed and well-nourished.  HENT:  Head: Normocephalic.  Mouth/Throat: Uvula is midline and oropharynx is clear and moist.  Eyes: Conjunctivae and EOM are normal. No scleral icterus.  Neck: Normal range of motion. Neck supple.  Cardiovascular: Intact distal pulses.   Murmur heard. Pulmonary/Chest: Effort normal. No respiratory distress. She has no wheezes.  Abdominal: Soft. Normal appearance. She exhibits no distension. There is no tenderness. There is no rebound, no guarding and no CVA tenderness.   Neurological: She is alert. She exhibits  normal muscle tone. Coordination normal.  Skin: Skin is warm. There is pallor.  Psychiatric: She has a normal mood and affect.    ED Course  Procedures (including critical care time) Labs Review Labs Reviewed  CBC WITH DIFFERENTIAL - Abnormal; Notable for the following:    RBC 2.94 (*)    Hemoglobin 8.9 (*)    HCT 26.5 (*)    Platelets 131 (*)    All other components within normal limits  COMPREHENSIVE METABOLIC PANEL - Abnormal; Notable for the following:    Sodium 132 (*)    Potassium 5.4 (*)    Glucose, Bld 108 (*)    Creatinine, Ser 1.12 (*)    Albumin 2.5 (*)    GFR calc non Af Amer 47 (*)    GFR calc Af Amer 55 (*)    All other components within normal limits  LIPASE, BLOOD - Abnormal; Notable for the following:    Lipase 64 (*)    All other components within normal limits  PROTIME-INR - Abnormal; Notable for the following:    Prothrombin Time 19.3 (*)    INR 1.68 (*)    All other components within normal limits  DIGOXIN LEVEL - Abnormal; Notable for the following:    Digoxin Level 0.7 (*)    All other components within normal limits  OCCULT BLOOD, POC DEVICE - Abnormal; Notable for the following:    Fecal Occult Bld POSITIVE (*)    All other components within normal limits  APTT  TROPONIN I      Imaging Review Ir Fluoro Guide Cv Line Right  12/15/2012   *RADIOLOGY REPORT*  PICC PLACEMENT WITH ULTRASOUND AND FLUOROSCOPIC  GUIDANCE  Clinical History: 72 year old female with endocarditis in need of durable central venous access for outpatient antibiotic therapy.  Fluoroscopy Time: 6 seconds  Procedure:  The right arm was prepped with chlorhexidine, draped in the usual sterile fashion using maximum barrier technique (cap and mask, sterile gown, sterile gloves, large sterile sheet, hand hygiene and cutaneous antiseptic).  Local anesthesia was attained by infiltration with 1% lidocaine.  Ultrasound demonstrated patency of the  right basilic vein, and this was documented with an image.  Under real-time ultrasound guidance, this vein was accessed with a 21 gauge micropuncture needle and image documentation was performed.  The needle was exchanged over a guidewire for a peel-away sheath through which a 41 cm 5 [place] dual lumen power injectable PICC was advanced, and positioned with its tip at the lower SVC/right atrial junction.  Fluoroscopy during the procedure and fluoro spot radiograph confirms appropriate catheter position.  The catheter was flushed, secured to the skin with Prolene sutures, and covered with a sterile dressing.  Complications:  None.  The patient tolerated the procedure well.  IMPRESSION:  Successful placement of a right basilic vein approach dual lumen power PICC with sonographic and fluoroscopic guidance.  The catheter is ready for use.  Signed,  Sterling Big, MD [facility]   Original Report Authenticated By: Malachy Moan, M.D.   Ir US Guide Vasc Access Right  12/15/2012   *RADIOLOGY REPORT*  PICC PLACEMENT WITH ULTRASOUND AND FLUOROSCOPIC  GUIDANCE  Clinical History: 72 year old female with endocarditis in need of durable central venous access for outpatient antibiotic therapy.  Fluoroscopy Time: 6 seconds  Procedure:  The right arm was prepped with chlorhexidine, draped in the usual sterile fashion using maximum barrier technique (cap and mask, sterile gown, sterile gloves, large sterile sheet,  hand hygiene and cutaneous antiseptic).  Local anesthesia was attained by infiltration with 1% lidocaine.  Ultrasound demonstrated patency of the right basilic vein, and this was documented with an image.  Under real-time ultrasound guidance, this vein was accessed with a 21 gauge micropuncture needle and image documentation was performed.  The needle was exchanged over a guidewire for a peel-away sheath through which a 41 cm 5 [place] dual lumen power  injectable PICC was advanced, and positioned with its tip at the lower SVC/right atrial junction.  Fluoroscopy during the procedure and fluoro spot radiograph confirms appropriate catheter position.  The catheter was flushed, secured to the skin with Prolene sutures, and covered with a sterile dressing.  Complications:  None.  The patient tolerated the procedure well.  IMPRESSION:  Successful placement of a right basilic vein approach dual lumen power PICC with sonographic and fluoroscopic guidance.  The catheter is ready for use.  Signed,  Sterling Big, MD [facility] Radiology   Original Report Authenticated By: Malachy Moan, M.D.   Dg Abd Acute W/chest  12/16/2012   *RADIOLOGY REPORT*  Clinical Data: Vomiting.  ACUTE ABDOMEN SERIES (ABDOMEN 2 VIEW & CHEST 1 VIEW)  Comparison: Chest x-ray 07/21/2012.  Findings: The heart is enlarged.  Previous CABG.  PICC line from right arm approach lies at the cavoatrial junction.  Mild vascular congestion.  Overall improved congestive failure from priors.  No effusion or pneumothorax.  Flat and decubitus abdomen reveals no free air over the liver.  Gas pattern is nonobstructive.  Moderate vascular calcification is seen.  Degenerative changes lumbar spine with levoconvex scoliosis but no osseous lesions.  IMPRESSION: Moderate vascular congestion without overt failure.  Nonobstructive gas pattern.   Original Report Authenticated By: Davonna Belling, M.D.    ECG at time [phone] shows atrial fibrillation at rate 74, ST and T wave abn inferior and lateral leads.    Pulse ox is 94% on O2 by [place] which is adequate by my interpretation.  Abn ECG.  Normal axis.  No sig change from ECG on 12/12/12.   7:30 AM Spoke to ID on call and was agreeable to my assessment and general plan to treat symptoms and discuss with hospitalist for re-admission as needed.  Family is very concerned about patient, despite the patient seemingly not in  acute distress at the moment.  Pt did receive AM abx already.  Will continue to monitor.  10:53 AM Pt declined nausea medications, overall feels improved.  Pt was apparently somewhat confused thsi AM with symptoms, was slower to respond, isn't sharp even like she was yesterday at discharge.  In addition, family is concerned because 1, hospitalist and Dr. Daiva Eves seemed to disagree on how long the patient ought to be admitted for.  In the 2 prior cases of endocarditis, pt had prolonged admission because often the patient didn't do well for the first week and pt decompensated in the hospital even when on appropriate treatment. They expected pt to stay longer.  Pt has no pain currently.  On rectal exam however, due to the anemia worse than priors shows heme + stool.  Will consult [facility] to see pt again and re-admission.        MDM   1. Nausea vomiting and diarrhea   2. GI bleed   3. Endocarditis      Pt with recent admission for presumptive endocarditis, 2 prior episodes presenting similarly, now with PIC line and on gentamycin and ampicillin.  Pt with worsening clinical symptoms, but stable vital signs, brief hypoxemia at arrival, seems improved after being in the ED and on O2 by [place].  Will get CXR, repeat labs, treat symptoms with IVF's and IV zofran.  Will get some labs and discuss with ID.      Gavin Pound. Chantry Headen, MD 12/16/12 2142

## 2012-12-16 NOTE — Progress Notes (Addendum)
Patient monitor ringing bradycardia, patient sitting on side of bed having vomiting episode. Patient states that she does not feel sick and that this type of episode does occur at home. Patient denies nausea, HR now back to normal with HR of 78. Dr. Blake Divine paged.

## 2012-12-16 NOTE — Progress Notes (Signed)
Report received from Clydie Braun, [place] in Emergency Department

## 2012-12-16 NOTE — Progress Notes (Addendum)
ANTIBIOTIC CONSULT NOTE - INITIAL   Pharmacy Consult for Gentamicin and Ampicillin Indication: Recurrent Amp S enterococcal endocarditis   Allergies  Allergen Reactions  . Ceftriaxone     RASH BUT TOLERATED AMPICILLIN AND IMIPENEM WITHOUT PROBLEMS  . Codeine Nausea Only    Assessment: 72 y.o. female presents today with N/V overnight. Pt just d/c home yesterday with PICC line and home antibiotics per [facility]. Pt on gentamicin and ampicillin (Day #2) for recurrent Amp sensitive enterococcal endocarditis. Plans per ID note 9/21 was for 6 weeks Amp/Gent followed by [facility] suppressive amoxicillin. SCr 1.12 - relatively stable. Home doses: Gentamicin 60mg  IV q12h - dose today at 0500. Original plan to check levels with tomorrow's dose. Ampicillin 2gm IV q4h - dose being given via right now (~1610) via [facility] pump  Goal of Therapy: Gent peak 3-4 mcg/ml Gent trough < 1 mcg/ml  Plan:  1) If pt admitted, will need to continue IV gentamicin and ampicillin and will need gentamicin levels with tomorrow's dose.  2) If pt remains in ED >4 hours, will need next ampicillin dose - due ~1300.  Christoper Fabian, PharmD, BCPS Clinical pharmacist, pager [phone] 12/16/2012,8:41 AM   Spoke with Dr. Oletta Lamas [phone]): Noted plan to admit pt and okay to continue ampicillin and gentamicin.  1) Continue Gentamicin 60mg  IV q12h - next dose at 1700 2) Continue Ampicillin 2gm IV q4h. Next dose now.  Pharmacy will follow.  Christoper Fabian, PharmD, BCPS Clinical pharmacist, pager [phone] 12/16/2012   1:16 PM

## 2012-12-16 NOTE — Progress Notes (Signed)
CCMD notified about new patient arriving to unit and being placed telemetry monitor [id number].

## 2012-12-16 NOTE — ED Notes (Signed)
Pt to ED this am because "she vomited last night" per daughters. Pt states it was in her sleep, doesn't remember--pt was d/c'd from hospital yesterday with IV antibiotics per PICC line that was placed yesterday in right upper arm. Dressing with some bloody drainage.

## 2012-12-16 NOTE — Progress Notes (Signed)
No response from Dr. Blake Divine, paged again. Will try MD on call for [facility] Hospitalist

## 2012-12-16 NOTE — H&P (Signed)
[facility] History and Physical  [name]:[id number] DOB: 08-Oct-1940 DOA: 12/16/2012  Referring physician: Dr Oletta Lamas PCP: [name], MD  Specialists: Dr Synthia Innocent  Chief Complaint: nausea, vomiting and diarrhea since this am.   HPI: [name] is a 72 y.o. female  with a history of 2 previous episodes of endocarditis, prosthetic mitral valve, Lupus, Pulmonary fibrosis, Afib, NSTEMI, HLD and CAD discharged yesterday came in today with nausea, VOMITING and one episode of diarrhea. On arrival to ED, pt was on 2 lit [place] oxygen, slightly dehydrated, but  comfortable, did not have any complaints. Her labs revealed mild renal insufficiency and hyperkalemia. She is referred to medical service for observation.   Review of Systems: could not be obtained. MOST of the history obtianed from the daughters at bedside.   Past Medical History  Diagnosis Date  . Coronary artery disease   . Atrial fibrillation     Now NSR  . GERD (gastroesophageal reflux disease)   . Hyperlipidemia   . 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 Anemia   . Angina   . Shortness of breath     with activity  . Hypertension   . Blood transfusion   . Pneumonia   . H/O hiatal hernia   . Lupus   . Insomnia   . Endocarditis   . Prosthetic valve endocarditis   . Enterococcal bacteremia   . Drug rash    Past Surgical History  Procedure Laterality Date  . Cardiac catheterization    . Cardiac valve replacement      2006  . Coronary artery bypass graft    . Givens capsule study  04/09/2011    Procedure: GIVENS CAPSULE STUDY;  Surgeon: Theda Belfast, MD;  Location: [facility] ENDOSCOPY;  Service: Endoscopy;  Laterality: N/A;  . Tee without cardioversion  02/06/2012    Procedure: TRANSESOPHAGEAL ECHOCARDIOGRAM (TEE);  Surgeon: Ricki Rodriguez, MD;  Location: [facility] ENDOSCOPY;  Service: Cardiovascular;  Laterality: N/A;  . Esophagogastroduodenoscopy  02/12/2012    Procedure:  ESOPHAGOGASTRODUODENOSCOPY (EGD);  Surgeon: Theda Belfast, MD;  Location: [facility] ENDOSCOPY;  Service: Endoscopy;  Laterality: N/A;  . Tee without cardioversion N/A 07/15/2012    Procedure: TRANSESOPHAGEAL ECHOCARDIOGRAM (TEE);  Surgeon: Ricki Rodriguez, MD;  Location: [facility] ENDOSCOPY;  Service: Cardiovascular;  Laterality: N/A;  . Breast lumpectomy  1977   Social History:  reports that she has never smoked. She has never used smokeless tobacco. She reports that she does not drink alcohol. Her drug history is not on file.  where does patient live--home,  Allergies  Allergen Reactions  . Ceftriaxone     RASH BUT TOLERATED AMPICILLIN AND IMIPENEM WITHOUT PROBLEMS  . Codeine Nausea Only    Family History  Problem Relation Age of Onset  . Heart disease Mother     CHF  . Mental illness Father     suicide  . Cancer Sister     pancreatic cancer  . COPD Brother      Prior to Admission medications   Medication Sig Start Date End Date Taking? Authorizing Provider  digoxin (LANOXIN) 0.125 MG tablet Take 0.125 mg by mouth every morning.   Yes Historical Provider, MD  furosemide (LASIX) 40 MG tablet Take 40 mg by mouth every morning.   Yes Historical Provider, MD  gentamicin (GARAMYCIN) 1.2-0.9 MG/ML-% Inject 50 mLs (60 mg total) into the vein every 12 (twelve) hours. 12/15/12  Yes Mutaz Elmahi,  MD  hydroxychloroquine (PLAQUENIL) 200 MG tablet Take 200 mg by mouth 2 (two) times daily.    Yes Historical Provider, MD  IRON PO Take 1 tablet by mouth 2 (two) times daily.   Yes Historical Provider, MD  metoprolol (LOPRESSOR) 50 MG tablet Take 0.5 tablets (25 mg total) by mouth 2 (two) times daily. 07/22/12  Yes [name], MD  pantoprazole (PROTONIX) 40 MG tablet Take 40 mg by mouth every morning.   Yes Historical Provider, MD  potassium chloride SA (K-DUR,KLOR-CON) 20 MEQ tablet Take 40-60 mEq by mouth 2 (two) times daily. Takes 2 tablets every morning and takes 3 tablets every evening.   Yes Historical  Provider, MD  predniSONE (DELTASONE) 5 MG tablet Take 1 tablet (5 mg total) by mouth every morning. 08/07/12  Yes Storm Frisk, MD  rOPINIRole (REQUIP) 1 MG tablet Take 1 mg by mouth at bedtime.    Yes Historical Provider, MD  rosuvastatin (CRESTOR) 10 MG tablet Take 10 mg by mouth every morning.    Yes Historical Provider, MD  sodium chloride 0.9 % SOLN 50 mL with ampicillin 2 G SOLR 2 g Inject 2 g into the vein every 4 (four) hours. 12/15/12  Yes Clydia Llano, MD  sulfaSALAzine (AZULFIDINE) 500 MG tablet Take 1,000 mg by mouth 2 (two) times daily.    Yes Historical Provider, MD  warfarin (COUMADIN) 3 MG tablet Take 3 mg by mouth every morning.   Yes Historical Provider, MD   Physical Exam: Filed Vitals:   12/16/12 1500  BP: 135/58  Pulse: 72  Temp: 98.1 F (36.7 C)  Resp: 22    Constitutional: Vital signs reviewed.  Patient is a well-developed and well-nourished  in no acute distress and cooperative with exam. Alert and comfortable.  Head: Normocephalic and atraumatic Mouth: no erythema or exudates, MMM Eyes: PERRL, EOMI, conjunctivae normal, No scleral icterus.  Neck: Supple, Trachea midline normal ROM, No JVD, mass, thyromegaly, or carotid bruit present.  Cardiovascular: RRR, S1 normal, S2 normal, no MRG, pulses symmetric and intact bilaterally Pulmonary/Chest: normal respiratory effort, CTAB, no wheezes, rales, or rhonchi Abdominal: Soft. Non-tender, non-distended, bowel sounds are normal, no masses, organomegaly, or guarding present.  Musculoskeletal: No joint deformities, erythema, or stiffness, ROM full and no nontender  Neurological: A&O x3, Strength is normal and symmetric bilaterally, cranial nerve II-XII are grossly intact, no focal motor deficit, sensory intact to light touch bilaterally.  Skin: Warm, dry and intact. No rash, cyanosis, or clubbing.  Psychiatric: Normal mood and affect. speech and behavior is normal.     Labs on Admission:  Basic Metabolic  Panel:  Recent Labs Lab 12/13/12 0418 12/14/12 1105 12/15/12 0500 12/16/12 0845 12/16/12 1555  NA 138 134* 134* 132* 134*  K 3.4* 2.9* 4.0 5.4* 4.5  CL 102 99 102 102 103  CO2 26 28 23 20 19   GLUCOSE 89 105* 98 108* 97  BUN 27* 19 18 17 15   CREATININE 1.04 0.88 0.99 1.12* 1.04  CALCIUM 8.6 8.7 8.6 8.8 8.6   Liver Function Tests:  Recent Labs Lab 12/11/12 1428 12/12/12 1357 12/16/12 0845  AST 13 14 21   ALT 8 5 6   ALKPHOS 98 116 112  BILITOT 0.8 0.5 0.6  PROT 7.2 7.5 6.9  ALBUMIN 3.3* 3.1* 2.5*    Recent Labs Lab 12/16/12 0845  LIPASE 64*   No results found for this basename: AMMONIA,  in the last 168 hours CBC:  Recent Labs Lab 12/11/12 1428 12/12/12  1357 12/13/12 0418 12/16/12 0845  WBC 7.5 7.3 5.7 8.8  NEUTROABS 5.3  --   --  6.7  HGB 10.9* 10.5* 9.7* 8.9*  HCT 31.6* 31.2* 28.6* 26.5*  MCV 88.3 89.7 89.7 90.1  PLT 148* 124* 121* 131*   Cardiac Enzymes:  Recent Labs Lab 12/13/12 1704 12/14/12 0531  CKTOTAL 33 27    BNP (last 3 results)  Recent Labs  07/11/12 1901 07/14/12 1651  PROBNP 12830.0* 5791.0*   CBG: No results found for this basename: GLUCAP,  in the last 168 hours  Radiological Exams on Admission: Ir Fluoro Guide Cv Line Right  12/15/2012   *RADIOLOGY REPORT*  PICC PLACEMENT WITH ULTRASOUND AND FLUOROSCOPIC  GUIDANCE  Clinical History: 72 year old female with endocarditis in need of durable central venous access for outpatient antibiotic therapy.  Fluoroscopy Time: 6 seconds  Procedure:  The right arm was prepped with chlorhexidine, draped in the usual sterile fashion using maximum barrier technique (cap and mask, sterile gown, sterile gloves, large sterile sheet, hand hygiene and cutaneous antiseptic).  Local anesthesia was attained by infiltration with 1% lidocaine.  Ultrasound demonstrated patency of the right basilic vein, and this was documented with an image.  Under real-time ultrasound guidance, this vein was accessed with  a 21 gauge micropuncture needle and image documentation was performed.  The needle was exchanged over a guidewire for a peel-away sheath through which a 41 cm 5 [place] dual lumen power injectable PICC was advanced, and positioned with its tip at the lower SVC/right atrial junction.  Fluoroscopy during the procedure and fluoro spot radiograph confirms appropriate catheter position.  The catheter was flushed, secured to the skin with Prolene sutures, and covered with a sterile dressing.  Complications:  None.  The patient tolerated the procedure well.  IMPRESSION:  Successful placement of a right basilic vein approach dual lumen power PICC with sonographic and fluoroscopic guidance.  The catheter is ready for use.  Signed,  Sterling Big, MD [facility]   Original Report Authenticated By: Malachy Moan, M.D.   Ir US Guide Vasc Access Right  12/15/2012   *RADIOLOGY REPORT*  PICC PLACEMENT WITH ULTRASOUND AND FLUOROSCOPIC  GUIDANCE  Clinical History: 72 year old female with endocarditis in need of durable central venous access for outpatient antibiotic therapy.  Fluoroscopy Time: 6 seconds  Procedure:  The right arm was prepped with chlorhexidine, draped in the usual sterile fashion using maximum barrier technique (cap and mask, sterile gown, sterile gloves, large sterile sheet, hand hygiene and cutaneous antiseptic).  Local anesthesia was attained by infiltration with 1% lidocaine.  Ultrasound demonstrated patency of the right basilic vein, and this was documented with an image.  Under real-time ultrasound guidance, this vein was accessed with a 21 gauge micropuncture needle and image documentation was performed.  The needle was exchanged over a guidewire for a peel-away sheath through which a 41 cm 5 [place] dual lumen power injectable PICC was advanced, and positioned with its tip at the lower SVC/right atrial junction.  Fluoroscopy during the  procedure and fluoro spot radiograph confirms appropriate catheter position.  The catheter was flushed, secured to the skin with Prolene sutures, and covered with a sterile dressing.  Complications:  None.  The patient tolerated the procedure well.  IMPRESSION:  Successful placement of a right basilic vein approach dual lumen power PICC with sonographic and fluoroscopic guidance.  The catheter is ready for use.  Signed,  Sterling Big, MD [facility]   Original Report Authenticated By: Malachy Moan, M.D.   Dg Abd Acute W/chest  12/16/2012   *RADIOLOGY REPORT*  Clinical Data: Vomiting.  ACUTE ABDOMEN SERIES (ABDOMEN 2 VIEW & CHEST 1 VIEW)  Comparison: Chest x-ray 07/21/2012.  Findings: The heart is enlarged.  Previous CABG.  PICC line from right arm approach lies at the cavoatrial junction.  Mild vascular congestion.  Overall improved congestive failure from priors.  No effusion or pneumothorax.  Flat and decubitus abdomen reveals no free air over the liver.  Gas pattern is nonobstructive.  Moderate vascular calcification is seen.  Degenerative changes lumbar spine with levoconvex scoliosis but no osseous lesions.  IMPRESSION: Moderate vascular congestion without overt failure.  Nonobstructive gas pattern.   Original Report Authenticated By: Davonna Belling, M.D.    EKG: atrial fib  Assessment/Plan Active Problems:     1.Nausea/ vomiting Diarrhea:  - possibly from antibiotics. Because of her recent hospitalization, we will obtain stool cultures, c diff pcr, . Begin symptomatic treatment with gentle hydration, IV anti emetics.  - resume antibitoics for endocarditis as per ID. abd film does not show any obstruction.  - ID Dr Ninetta Lights on board.   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 He was  seen by infectious disease he was put on ampicillin and  gentamicin. Antibiotics should be continued for a total of 6 weeks of therapy. Patient did not have TEE or evaluation by cardiology because she is deemed she  is not a surgical candidate. This is her third episode of endocarditis.  3. 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  4. Atrial fibrillation: Patient is on chronic Coumadin therapy, her rate is controlled with digoxin.  5. Severe protein calorie malnutrition: Secondary to her chronic illness, raising the edition so the patient in the hospital recommended appropriate diet.  6. Enterococcus bacteremia: With resultant endocarditis, susceptibility showed ampicillin sensitive. Patient is getting treatment with ampicillin and gentamicin. 7. Pulmonary congestion; stop fluids and resume lasix at a low dose.  8. DVT prophylaxis.       Code Status: full code Family Communication: multiple family members at bedside. Disposition Plan: pending.   Time spent: 65 min  Margarite Vessel [facility] Pager [phone]  If 7PM-7AM, please contact night-coverage [email or web address] Password [facility] 12/16/2012, 7:01 PM

## 2012-12-16 NOTE — Progress Notes (Signed)
Utilization Review Completed.   Dennys Traughber, RN, BSN Nurse Case Manager  [phone]  

## 2012-12-16 NOTE — ED Notes (Signed)
Pt c/o N/V this am and generalized weakness; pt discharged yesterday for endocarditis per pt; pt poor historian

## 2012-12-16 NOTE — ED Notes (Signed)
IV team notified-- will check PICC line-- PICC placed yesterday-- right upper arm-- has IV tubing attached receiving IV antibiotics at home.

## 2012-12-16 NOTE — ED Notes (Signed)
Pt resting family at bedside.

## 2012-12-16 NOTE — Progress Notes (Signed)
INFECTIOUS DISEASE CONSULT NOTE  Date of Admission:  12/16/2012  Date of Consult:  12/16/2012  Reason for Consult:n/v, diarrhea, endocarditis Referring Physician:  Impression/Recommendation N/V Diarrhea Endocarditis, prosthetic Ao valve  Would Check stool for c diff Try to control her sx Watch her Cr very closely, she is dehydrated and on Gent.  Check UCx, BCx  Comment- hard to know if this is c diff, uti, adr to amp?  Hopefully can get her "tuned up". As noted previously, she is not a surgical candidate.   Thank you so much for this interesting consult,   Johny Sax (pager) [phone] [email or web address]  [name] is an 72 y.o. female.  HPI: 72 yo F adm 9-18 with hx of completing 6 weeks of IV amp and imipenem on June 4 for prosthetic Ao valve IE (enterococcus). She had f/u at ID clinic on 9-16 (with worsening fatigue, wt loss, anorexia) and her repeat BCx at that time grew enterococci. She was re-adm and was started on imipenem and vanco. She was d/c home on 9-22 on amp and gent after getting this for 2 days in hospital. She had f/u BCx taht were (-).  Last night she felt her normal self, ate dinner with family. Staying with her daughter.  This AM she was lethargic, had fever (100.0) and had n/v, diarrhea. She has had no problems with her PIC.   Past Medical History  Diagnosis Date  . Coronary artery disease   . Atrial fibrillation     Now NSR  . GERD (gastroesophageal reflux disease)   . Hyperlipidemia   . 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 Anemia   . Angina   . Shortness of breath     with activity  . Hypertension   . Blood transfusion   . Pneumonia   . H/O hiatal hernia   . Lupus   . Insomnia   . Endocarditis   . Prosthetic valve endocarditis   . Enterococcal bacteremia   . Drug rash     Past Surgical History  Procedure Laterality Date  . Cardiac catheterization    . Cardiac valve replacement      2006  .  Coronary artery bypass graft    . Givens capsule study  04/09/2011    Procedure: GIVENS CAPSULE STUDY;  Surgeon: Theda Belfast, MD;  Location: [facility] ENDOSCOPY;  Service: Endoscopy;  Laterality: N/A;  . Tee without cardioversion  02/06/2012    Procedure: TRANSESOPHAGEAL ECHOCARDIOGRAM (TEE);  Surgeon: Ricki Rodriguez, MD;  Location: [facility] ENDOSCOPY;  Service: Cardiovascular;  Laterality: N/A;  . Esophagogastroduodenoscopy  02/12/2012    Procedure: ESOPHAGOGASTRODUODENOSCOPY (EGD);  Surgeon: Theda Belfast, MD;  Location: [facility] ENDOSCOPY;  Service: Endoscopy;  Laterality: N/A;  . Tee without cardioversion N/A 07/15/2012    Procedure: TRANSESOPHAGEAL ECHOCARDIOGRAM (TEE);  Surgeon: Ricki Rodriguez, MD;  Location: [facility] ENDOSCOPY;  Service: Cardiovascular;  Laterality: N/A;  . Breast lumpectomy  1977     Allergies  Allergen Reactions  . Ceftriaxone     RASH BUT TOLERATED AMPICILLIN AND IMIPENEM WITHOUT PROBLEMS  . Codeine Nausea Only    Medications:  Scheduled: . sodium chloride   Intravenous STAT  . ampicillin (OMNIPEN) IV  2 g Intravenous Q4H  . atorvastatin  20 mg Oral q1800  . digoxin  0.125 mg Oral q morning - 10a  . gentamicin  60 mg Intravenous Q12H  . hydroxychloroquine  200 mg Oral BID  . metoprolol  25 mg Oral BID  . ondansetron (ZOFRAN) IV  4 mg Intravenous Once  . [START ON 12/17/2012] pantoprazole  40 mg Oral q morning - 10a  . pantoprazole (PROTONIX) IV  40 mg Intravenous Once  . [START ON 12/17/2012] predniSONE  5 mg Oral QAC breakfast  . rOPINIRole  1 mg Oral QHS  . sodium chloride  10-40 mL Intracatheter Q12H  . sulfaSALAzine  1,000 mg Oral BID    Total days of antibiotics: 5 (amp and gent)          Social History:  reports that she has never smoked. She has never used smokeless tobacco. She reports that she does not drink alcohol. Her drug history is not on file.  Family History  Problem Relation Age of Onset  . Heart disease Mother     CHF  . Mental illness Father      suicide  . Cancer Sister     pancreatic cancer  . COPD Brother     mild oozing at [facility] site. no problems infusing, no sob, no LE edema, no dysuria, disoriented, see HPI.   Blood pressure 135/58, pulse 72, temperature 98.1 F (36.7 C), temperature source Oral, resp. rate 22, height 5\' 7"  (1.702 m), weight 70.081 kg (154 lb 8 oz), SpO2 95.00%. General appearance: alert, cooperative, fatigued and no distress Eyes: negative findings: pupils equal, round, reactive to light and accomodation Throat: normal findings: no thrush and dry Neck: no adenopathy and supple, symmetrical, trachea midline Lungs: clear to auscultation bilaterally Heart: irregularly irregular rhythm Abdomen: normal findings: bowel sounds normal and soft, non-tender Extremities: edema none. Her RUE PIC has blood at site but no tenderness, no cordis.    Results for orders placed during the hospital encounter of 12/16/12 (from the past 48 hour(s))  CBC WITH DIFFERENTIAL     Status: Abnormal   Collection Time    12/16/12  8:45 AM      Result Value Range   WBC 8.8  4.0 - 10.5 K/uL   RBC 2.94 (*) 3.87 - 5.11 MIL/uL   Hemoglobin 8.9 (*) 12.0 - 15.0 g/dL   HCT 16.1 (*) 09.6 - 04.5 %   MCV 90.1  78.0 - 100.0 fL   MCH 30.3  26.0 - 34.0 pg   MCHC 33.6  30.0 - 36.0 g/dL   RDW 40.9  81.1 - 91.4 %   Platelets 131 (*) 150 - 400 K/uL   Neutrophils Relative % 76  43 - 77 %   Neutro Abs 6.7  1.7 - 7.7 K/uL   Lymphocytes Relative 13  12 - 46 %   Lymphs Abs 1.2  0.7 - 4.0 K/uL   Monocytes Relative 10  3 - 12 %   Monocytes Absolute 0.9  0.1 - 1.0 K/uL   Eosinophils Relative 1  0 - 5 %   Eosinophils Absolute 0.1  0.0 - 0.7 K/uL   Basophils Relative 0  0 - 1 %   Basophils Absolute 0.0  0.0 - 0.1 K/uL  COMPREHENSIVE METABOLIC PANEL     Status: Abnormal   Collection Time    12/16/12  8:45 AM      Result Value Range   Sodium 132 (*) 135 - 145 mEq/L   Potassium 5.4 (*) 3.5 - 5.1 mEq/L   Chloride 102  96 - 112 mEq/L   CO2 20  19 -  32 mEq/L   Glucose, Bld 108 (*) 70 - 99 mg/dL   BUN 17  6 - 23 mg/dL   Creatinine, Ser 8.46 (*) 0.50 - 1.10 mg/dL   Calcium 8.8  8.4 - 96.2 mg/dL   Total Protein 6.9  6.0 - 8.3 g/dL   Albumin 2.5 (*) 3.5 - 5.2 g/dL   AST 21  0 - 37 U/L   ALT 6  0 - 35 U/L   Alkaline Phosphatase 112  39 - 117 U/L   Total Bilirubin 0.6  0.3 - 1.2 mg/dL   GFR calc non Af Amer 47 (*) >90 mL/min   GFR calc Af Amer 55 (*) >90 mL/min   Comment: (NOTE)     The eGFR has been calculated using the CKD EPI equation.     This calculation has not been validated in all clinical situations.     eGFR's persistently <90 mL/min signify possible Chronic Kidney     Disease.  LIPASE, BLOOD     Status: Abnormal   Collection Time    12/16/12  8:45 AM      Result Value Range   Lipase 64 (*) 11 - 59 U/L  APTT     Status: None   Collection Time    12/16/12  8:45 AM      Result Value Range   aPTT 36  24 - 37 seconds  PROTIME-INR     Status: Abnormal   Collection Time    12/16/12  8:45 AM      Result Value Range   Prothrombin Time 19.3 (*) 11.6 - 15.2 seconds   INR 1.68 (*) 0.00 - 1.49  DIGOXIN LEVEL     Status: Abnormal   Collection Time    12/16/12  8:45 AM      Result Value Range   Digoxin Level 0.7 (*) 0.8 - 2.0 ng/mL  OCCULT BLOOD, POC DEVICE     Status: Abnormal   Collection Time    12/16/12 10:52 AM      Result Value Range   Fecal Occult Bld POSITIVE (*) NEGATIVE      Component Value Date/Time   SDES BLOOD LEFT ARM 12/13/2012 0945   SPECREQUEST BOTTLES DRAWN AEROBIC AND ANAEROBIC 10CC 12/13/2012 0945   CULT  Value:        BLOOD CULTURE RECEIVED NO GROWTH TO DATE CULTURE WILL BE HELD FOR 5 DAYS BEFORE ISSUING A FINAL NEGATIVE REPORT Performed at [facility] 12/13/2012 0945   REPTSTATUS PENDING 12/13/2012 0945   Ir Fluoro Guide Cv Line Right  12/15/2012   *RADIOLOGY REPORT*  PICC PLACEMENT WITH ULTRASOUND AND FLUOROSCOPIC  GUIDANCE  Clinical History: 72 year old female with endocarditis in need of  durable central venous access for outpatient antibiotic therapy.  Fluoroscopy Time: 6 seconds  Procedure:  The right arm was prepped with chlorhexidine, draped in the usual sterile fashion using maximum barrier technique (cap and mask, sterile gown, sterile gloves, large sterile sheet, hand hygiene and cutaneous antiseptic).  Local anesthesia was attained by infiltration with 1% lidocaine.  Ultrasound demonstrated patency of the right basilic vein, and this was documented with an image.  Under real-time ultrasound guidance, this vein was accessed with a 21 gauge micropuncture needle and image documentation was performed.  The needle was exchanged over a guidewire for a peel-away sheath through which a 41 cm 5 [place] dual lumen power injectable PICC was advanced, and positioned with its tip at the lower SVC/right atrial junction.  Fluoroscopy during the procedure and fluoro spot radiograph confirms appropriate catheter position.  The catheter was flushed, secured to  the skin with Prolene sutures, and covered with a sterile dressing.  Complications:  None.  The patient tolerated the procedure well.  IMPRESSION:  Successful placement of a right basilic vein approach dual lumen power PICC with sonographic and fluoroscopic guidance.  The catheter is ready for use.  Signed,  Sterling Big, MD [facility]   Original Report Authenticated By: Malachy Moan, M.D.   Ir US Guide Vasc Access Right  12/15/2012   *RADIOLOGY REPORT*  PICC PLACEMENT WITH ULTRASOUND AND FLUOROSCOPIC  GUIDANCE  Clinical History: 72 year old female with endocarditis in need of durable central venous access for outpatient antibiotic therapy.  Fluoroscopy Time: 6 seconds  Procedure:  The right arm was prepped with chlorhexidine, draped in the usual sterile fashion using maximum barrier technique (cap and mask, sterile gown, sterile gloves, large sterile sheet, hand hygiene and cutaneous  antiseptic).  Local anesthesia was attained by infiltration with 1% lidocaine.  Ultrasound demonstrated patency of the right basilic vein, and this was documented with an image.  Under real-time ultrasound guidance, this vein was accessed with a 21 gauge micropuncture needle and image documentation was performed.  The needle was exchanged over a guidewire for a peel-away sheath through which a 41 cm 5 [place] dual lumen power injectable PICC was advanced, and positioned with its tip at the lower SVC/right atrial junction.  Fluoroscopy during the procedure and fluoro spot radiograph confirms appropriate catheter position.  The catheter was flushed, secured to the skin with Prolene sutures, and covered with a sterile dressing.  Complications:  None.  The patient tolerated the procedure well.  IMPRESSION:  Successful placement of a right basilic vein approach dual lumen power PICC with sonographic and fluoroscopic guidance.  The catheter is ready for use.  Signed,  Sterling Big, MD [facility] Radiology   Original Report Authenticated By: Malachy Moan, M.D.   Dg Abd Acute W/chest  12/16/2012   *RADIOLOGY REPORT*  Clinical Data: Vomiting.  ACUTE ABDOMEN SERIES (ABDOMEN 2 VIEW & CHEST 1 VIEW)  Comparison: Chest x-ray 07/21/2012.  Findings: The heart is enlarged.  Previous CABG.  PICC line from right arm approach lies at the cavoatrial junction.  Mild vascular congestion.  Overall improved congestive failure from priors.  No effusion or pneumothorax.  Flat and decubitus abdomen reveals no free air over the liver.  Gas pattern is nonobstructive.  Moderate vascular calcification is seen.  Degenerative changes lumbar spine with levoconvex scoliosis but no osseous lesions.  IMPRESSION: Moderate vascular congestion without overt failure.  Nonobstructive gas pattern.   Original Report Authenticated By: Davonna Belling, M.D.   Recent Results (from the past 240 hour(s))    CULTURE, BLOOD (SINGLE)     Status: None   Collection Time    12/09/12 11:40 AM      Result Value Range Status   Organism ID, Bacteria ENTEROCOCCUS SPECIES   Final   Comment: Gram Stain Report Called to,Read Back By     and Verified With:     DR VAN DAM 1115 12/12/12 BY KRAWS     Susceptibilities performed on previous culture     within the last 5 days.  CULTURE, BLOOD (SINGLE)     Status: None   Collection Time    12/09/12 11:40 AM      Result Value Range Status   Culture ENTEROCOCCUS SPECIES   Final   Organism ID, Bacteria ENTEROCOCCUS SPECIES   Final   Comment:  Corrected results called to:     DR VAN DAM 1115 12/12/12 BY KRAWS     Previously reported as Gram Negative Coccobacilli     Combination Therapy of high-dose Ampicillin or     Vancomycin,plus an aminoglycoside,is usually     indicated for serious Enterococcal infections.     Gram Stain Report Called to,Read Back By     and Verified With:     DR VAN DAM 12/11/12 1015 BY SMITHERSJ  CULTURE, BLOOD (SINGLE)     Status: None   Collection Time    12/11/12  2:28 PM      Result Value Range Status   Organism ID, Bacteria ENTEROCOCCUS SPECIES   Final   Comment: Susceptibilities performed on previous culture     within the last 5 days.     Gram Stain Report Called to,Read Back By     and Verified With:     DR VAN DAM 12/15/12 1520 BY SMITHERSJ  CULTURE, BLOOD (SINGLE)     Status: None   Collection Time    12/11/12  2:28 PM      Result Value Range Status   Organism ID, Bacteria ENTEROCOCCUS SPECIES   Final   Comment: Susceptibilities performed on previous culture     within the last 5 days.     Gram Stain Report Called to,Read Back By     and Verified With:     DR VAN DAM 12/15/12 1518 BY SMITHERSJ  CULTURE, BLOOD (ROUTINE X 2)     Status: None   Collection Time    12/13/12  8:30 AM      Result Value Range Status   Specimen Description BLOOD LEFT ARM   Final   Special Requests BOTTLES DRAWN AEROBIC AND ANAEROBIC 10CC    Final   Culture  Setup Time     Final   Value: 12/14/2012 13:27     Performed at [facility]   Culture     Final   Value:        BLOOD CULTURE RECEIVED NO GROWTH TO DATE CULTURE WILL BE HELD FOR 5 DAYS BEFORE ISSUING A FINAL NEGATIVE REPORT     Performed at [facility]   Report Status PENDING   Incomplete  CULTURE, BLOOD (ROUTINE X 2)     Status: None   Collection Time    12/13/12  9:45 AM      Result Value Range Status   Specimen Description BLOOD LEFT ARM   Final   Special Requests BOTTLES DRAWN AEROBIC AND ANAEROBIC 10CC   Final   Culture  Setup Time     Final   Value: 12/14/2012 13:27     Performed at [facility]   Culture     Final   Value:        BLOOD CULTURE RECEIVED NO GROWTH TO DATE CULTURE WILL BE HELD FOR 5 DAYS BEFORE ISSUING A FINAL NEGATIVE REPORT     Performed at [facility]   Report Status PENDING   Incomplete      12/16/2012, 4:03 PM     LOS: 0 days

## 2012-12-16 NOTE — Progress Notes (Signed)
Dr. Blake Divine notified per order that patient has arrived to unit.

## 2012-12-16 NOTE — Progress Notes (Signed)
Spoke with Clydie Braun from [employer] regarding patients HR going bradicardia during vomiting episode. Clydie Braun states just to monitor HR and let her know if HR goes down again. Will report to [facility], RN (night shift nurse.

## 2012-12-16 NOTE — ED Notes (Signed)
Daughter asking for ice chips/water for patient--states always is nauseated

## 2012-12-17 ENCOUNTER — Encounter: Payer: Self-pay | Admitting: *Deleted

## 2012-12-17 ENCOUNTER — Encounter (HOSPITAL_COMMUNITY): Payer: Self-pay | Admitting: General Practice

## 2012-12-17 DIAGNOSIS — M329 Systemic lupus erythematosus, unspecified: Secondary | ICD-10-CM | POA: Diagnosis present

## 2012-12-17 DIAGNOSIS — E43 Unspecified severe protein-calorie malnutrition: Secondary | ICD-10-CM | POA: Diagnosis present

## 2012-12-17 DIAGNOSIS — E860 Dehydration: Secondary | ICD-10-CM

## 2012-12-17 DIAGNOSIS — B952 Enterococcus as the cause of diseases classified elsewhere: Secondary | ICD-10-CM

## 2012-12-17 DIAGNOSIS — R82998 Other abnormal findings in urine: Secondary | ICD-10-CM

## 2012-12-17 DIAGNOSIS — R112 Nausea with vomiting, unspecified: Secondary | ICD-10-CM | POA: Diagnosis present

## 2012-12-17 LAB — URINE MICROSCOPIC-ADD ON

## 2012-12-17 LAB — PROTIME-INR: INR: 2.1 — ABNORMAL HIGH (ref 0.00–1.49)

## 2012-12-17 LAB — GENTAMICIN LEVEL, TROUGH: Gentamicin Trough: 1.4 ug/mL (ref 0.5–2.0)

## 2012-12-17 LAB — URINALYSIS, ROUTINE W REFLEX MICROSCOPIC
Glucose, UA: NEGATIVE mg/dL
Hgb urine dipstick: NEGATIVE
Ketones, ur: NEGATIVE mg/dL
Leukocytes, UA: NEGATIVE
Protein, ur: 30 mg/dL — AB
Urobilinogen, UA: 1 mg/dL (ref 0.0–1.0)
pH: 5.5 (ref 5.0–8.0)

## 2012-12-17 LAB — GENTAMICIN LEVEL, PEAK: Gentamicin Pk: 3.3 ug/mL — ABNORMAL LOW (ref 5.0–10.0)

## 2012-12-17 MED ORDER — WARFARIN SODIUM 4 MG PO TABS
4.0000 mg | ORAL_TABLET | Freq: Once | ORAL | Status: AC
  Administered 2012-12-17: 18:00:00 4 mg via ORAL
  Filled 2012-12-17: qty 1

## 2012-12-17 MED ORDER — BOOST / RESOURCE BREEZE PO LIQD
1.0000 | Freq: Two times a day (BID) | ORAL | Status: DC
  Administered 2012-12-17 – 2012-12-20 (×4): 1 via ORAL

## 2012-12-17 MED ORDER — GENTAMICIN IN SALINE 1.2-0.9 MG/ML-% IV SOLN
60.0000 mg | Freq: Two times a day (BID) | INTRAVENOUS | Status: DC
  Administered 2012-12-17 – 2012-12-18 (×2): 60 mg via INTRAVENOUS
  Filled 2012-12-17 (×4): qty 50

## 2012-12-17 NOTE — Progress Notes (Signed)
12/17/12 0736 Dr. Catha Gosselin with [facility] hospitalist paged and notified this am. MD was on the unit and was able to notify again in person. Pt. HR increased to 140's non-sustained with ambulation to beside commode this am. VS were taken and documented. Pt.was asymptomatic.

## 2012-12-17 NOTE — Progress Notes (Signed)
ANTIBIOTIC CONSULT NOTE - Follow up  Pharmacy Consult for Gentamicin and Ampicillin Indication: Recurrent Amp S enterococcal endocarditis   Allergies  Allergen Reactions  . Ceftriaxone     RASH BUT TOLERATED AMPICILLIN AND IMIPENEM WITHOUT PROBLEMS  . Codeine Nausea Only    Assessment:  Gentamicin steady state peak and trough measured tonight on current regimen of 60 mg IV q12h and ampicillin 2 g IV q4h (day #3) for recurrent Amp sensitive enterococcal endocarditis in this 72 y.o. female.   Gentamicin trough = 1.4 mcg/ml (drawn ~1.2 hr early as dose was given at 06:59, infuse over 30 min, then trough drawn 10.25 hours after dose infused). Expect true trough would be closer to 1 or less.  Gent 60 mg IV dose given @ 18:44, infused over 30 min.,  Gent peak = 3.3 mcg/ml, drawn ~1.4hr after dose)  Could decrease to 50mg  IV q12hr but trough was drawn early, gentamicin dose is available as 60mg  premixed, current trough level is <2 mcg/ml, peak level therapeutic, renal function currently stable.  SCr = 1.04 relatively stable , 1.12 yesterday, 0.99 on 12/15/12.   Pt just discharged  home on 12/15/12 with PICC line and home antibiotics per [facility].  Plans per ID note 9/21 was for 6 weeks Amp/Gent followed by [facility] suppressive amoxicillin.   Goal of Therapy: Gent peak 3-4 mcg/ml Gent trough < 1 mcg/ml  Plan: Continue Gentamicin 60 mg IV q12 hours.  Follow renal function closely.  If Scr rises will need to recheck levels early.  No change in ampicillin dose.    Noah Delaine, RPh Clinical Pharmacist Pager: [phone] 12/17/2012,10:54 PM   Spoke with Dr. Oletta Lamas [phone]): Noted plan to admit pt and okay to continue ampicillin and gentamicin.  1) Continue Gentamicin 60mg  IV q12h - next dose at 1700 2) Continue Ampicillin 2gm IV q4h. Next dose now.  Pharmacy will follow.  Christoper Fabian, PharmD, BCPS Clinical pharmacist, pager [phone] 12/17/2012   10:54 PM

## 2012-12-17 NOTE — Telephone Encounter (Signed)
error 

## 2012-12-17 NOTE — Progress Notes (Signed)
[facility]                                                                                Patient Demographics  [name], is a 72 y.o. female, DOB - [birth year], ZOX:[id number]  Admit date - 12/16/2012   Admitting Physician Kathlen Mody, MD  Outpatient Primary MD for the patient is [name], MD  LOS - 1   Chief Complaint  Patient presents with  . Emesis  . Weakness        Assessment & Plan  1. Nausea/ vomiting Diarrhea:   Improved and resolved.  Still pending C. difficile PCR and stool cultures. Although patient has not had a stool.  -May be secondary to her antibiotics due to recent hospitalization.   Although we'll continue her antibiotics. Due to endocarditis. Infectious disease following.  -Will advance diet as tolerated.  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 He was seen by infectious disease he was put on ampicillin and gentamicin. Antibiotics should be continued for a total of 6 weeks of therapy. Patient did not have TEE or evaluation by cardiology because she is deemed she is not a surgical candidate. This is her third episode of endocarditis.   Patient has PICC line in place we'll also interrogate PICC line.  3. 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   4. Atrial fibrillation: Patient is on chronic Coumadin therapy, her rate is controlled with digoxin.   5. Severe protein calorie malnutrition: Secondary to her chronic illness.   6. Enterococcus bacteremia: With resultant endocarditis, susceptibility showed ampicillin sensitive. Patient is getting treatment with ampicillin and gentamicin.   7. Pulmonary congestion; stop fluids and resume lasix at a low dose.   8. DVT prophylaxis- on coumadin with pharmacy to follow.   Active Problems:   * No active hospital problems. *   Code Status: Full   Family  Communication: Daughter at bedside   Disposition Plan: Admitted.  Will continue to monitor patient and advance her diet as tolerated.   Procedures None   Consults  Infectious Disease   DVT Prophylaxis Warfarin   Lab Results  Component Value Date   PLT 131* 12/16/2012    Medications  Scheduled Meds: . ampicillin (OMNIPEN) IV  2 g Intravenous Q4H  . atorvastatin  20 mg Oral q1800  . digoxin  0.125 mg Oral q morning - 10a  . feeding supplement  1 Container Oral BID BM  . furosemide  20 mg Oral Daily  . gentamicin  60 mg Intravenous Q12H  . hydroxychloroquine  200 mg Oral BID  . metoprolol  25 mg Oral BID  . ondansetron (ZOFRAN) IV  4 mg Intravenous Once  . pantoprazole  40 mg Oral q morning - 10a  . predniSONE  5 mg Oral QAC breakfast  . rOPINIRole  1 mg Oral QHS  . sodium chloride  10-40 mL Intracatheter Q12H  . sulfaSALAzine  1,000 mg Oral BID  . warfarin  4 mg Oral ONCE-1800  . Warfarin - Pharmacist Dosing Inpatient   Does not apply q1800  . zolpidem  5 mg  Oral Once   Continuous Infusions: . sodium chloride 10 mL/hr at 12/16/12 2312   PRN Meds:.promethazine, sodium chloride  Antibiotics    Anti-infectives   Start     Dose/Rate Route Frequency Ordered Stop   12/17/12 1800  gentamicin (GARAMYCIN) IVPB 60 mg     60 mg 100 mL/hr over 30 Minutes Intravenous Every 12 hours 12/17/12 0834     12/16/12 1800  hydroxychloroquine (PLAQUENIL) tablet 200 mg     200 mg Oral 2 times daily 12/16/12 1456     12/16/12 1700  gentamicin (GARAMYCIN) IVPB 60 mg  Status:  Discontinued     60 mg 100 mL/hr over 30 Minutes Intravenous Every 12 hours 12/16/12 1318 12/17/12 0834   12/16/12 1700  ampicillin (OMNIPEN) 2 g in sodium chloride 0.9 % 50 mL IVPB     2 g 150 mL/hr over 20 Minutes Intravenous Every 4 hours 12/16/12 1502     12/16/12 1430  ampicillin (OMNIPEN) 2 g in sodium chloride 0.9 % 50 mL IVPB  Status:  Discontinued     2 g 150 mL/hr over 20 Minutes Intravenous 6 times per  day 12/16/12 1318 12/16/12 1503       Time Spent in minutes   35 minutes   Waylon Hershey D.O. on 12/17/2012 at 4:25 PM  Between 7am to 7pm - Pager - [phone]  After 7pm go to [email or web address] - password [email or web address]  And look for the night coverage person covering for me after hours  [facility] Office  [phone]    Subjective:   [name] today has, No headache, No chest pain, No abdominal pain - No Nausea, No new weakness tingling or numbness, No Cough.  Some SOB when lying down.  States dizziness and nausea have subsided, however, still unable to tolerate liquid diet.   Objective:   Filed Vitals:   12/17/12 0334 12/17/12 0650 12/17/12 0900 12/17/12 1343  BP: 121/63 110/65 115/71 114/66  Pulse: 92 86 80 72  Temp: 99.5 F (37.5 C) 99.3 F (37.4 C)  97.3 F (36.3 C)  TempSrc: Oral Oral Oral Oral  Resp: 24 20 24 20   Height:      Weight: 70.3 kg (154 lb 15.7 oz)     SpO2: 92% 92% 97% 95%    Wt Readings from Last 3 Encounters:  12/17/12 70.3 kg (154 lb 15.7 oz)  12/12/12 69.264 kg (152 lb 11.2 oz)  12/11/12 69.202 kg (152 lb 9 oz)     Intake/Output Summary (Last 24 hours) at 12/17/12 1625 Last data filed at 12/17/12 1415  Gross per 24 hour  Intake   1020 ml  Output   1075 ml  Net    -55 ml    Exam General: Awake Alert, Oriented X 3, No new deficits, Normal affect  HEENT: [place]/AT,PERRAL  Neck: Supple Neck,No JVD, No cervical lymphadenopathy appriciated.  Respiratory: Symmetrical Chest wall movement, Good air movement bilaterally, CTAB  Cardiovascular: RRR. No Gallops,Rubs or new Murmurs  Abdomen: Soft, nontender, nondistended, positive bowel sounds.  Extremities:No Cyanosis, Clubbing, edema Neuro: Grossly intact, nonfocal.    Data Review   Micro Results Recent Results (from the past 240 hour(s))  CULTURE, BLOOD (SINGLE)     Status: None   Collection Time    12/09/12 11:40 AM      Result Value Range Status   Organism ID, Bacteria  ENTEROCOCCUS SPECIES   Final   Comment: Gram Stain Report Called to,Read Back By  and Verified With:     DR VAN DAM 1115 12/12/12 BY KRAWS     Susceptibilities performed on previous culture     within the last 5 days.  CULTURE, BLOOD (SINGLE)     Status: None   Collection Time    12/09/12 11:40 AM      Result Value Range Status   Culture ENTEROCOCCUS SPECIES   Final   Organism ID, Bacteria ENTEROCOCCUS SPECIES   Final   Comment: Corrected results called to:     DR VAN DAM 1115 12/12/12 BY KRAWS     Previously reported as Gram Negative Coccobacilli     Combination Therapy of high-dose Ampicillin or     Vancomycin,plus an aminoglycoside,is usually     indicated for serious Enterococcal infections.     Gram Stain Report Called to,Read Back By     and Verified With:     DR VAN DAM 12/11/12 1015 BY SMITHERSJ  CULTURE, BLOOD (SINGLE)     Status: None   Collection Time    12/11/12  2:28 PM      Result Value Range Status   Organism ID, Bacteria ENTEROCOCCUS SPECIES   Final   Comment: Susceptibilities performed on previous culture     within the last 5 days.     Gram Stain Report Called to,Read Back By     and Verified With:     DR VAN DAM 12/15/12 1520 BY SMITHERSJ  CULTURE, BLOOD (SINGLE)     Status: None   Collection Time    12/11/12  2:28 PM      Result Value Range Status   Organism ID, Bacteria ENTEROCOCCUS SPECIES   Final   Comment: Susceptibilities performed on previous culture     within the last 5 days.     Gram Stain Report Called to,Read Back By     and Verified With:     DR VAN DAM 12/15/12 1518 BY SMITHERSJ  CULTURE, BLOOD (ROUTINE X 2)     Status: None   Collection Time    12/13/12  8:30 AM      Result Value Range Status   Specimen Description BLOOD LEFT ARM   Final   Special Requests BOTTLES DRAWN AEROBIC AND ANAEROBIC 10CC   Final   Culture  Setup Time     Final   Value: 12/14/2012 13:27     Performed at [facility]   Culture     Final   Value:         BLOOD CULTURE RECEIVED NO GROWTH TO DATE CULTURE WILL BE HELD FOR 5 DAYS BEFORE ISSUING A FINAL NEGATIVE REPORT     Performed at [facility]   Report Status PENDING   Incomplete  CULTURE, BLOOD (ROUTINE X 2)     Status: None   Collection Time    12/13/12  9:45 AM      Result Value Range Status   Specimen Description BLOOD LEFT ARM   Final   Special Requests BOTTLES DRAWN AEROBIC AND ANAEROBIC 10CC   Final   Culture  Setup Time     Final   Value: 12/14/2012 13:27     Performed at [facility]   Culture     Final   Value:        BLOOD CULTURE RECEIVED NO GROWTH TO DATE CULTURE WILL BE HELD FOR 5 DAYS BEFORE ISSUING A FINAL NEGATIVE REPORT     Performed at [facility]  Report Status PENDING   Incomplete    Radiology Reports Ir Fluoro Guide Cv Line Right  12/15/2012   *RADIOLOGY REPORT*  PICC PLACEMENT WITH ULTRASOUND AND FLUOROSCOPIC  GUIDANCE  Clinical History: 72 year old female with endocarditis in need of durable central venous access for outpatient antibiotic therapy.  Fluoroscopy Time: 6 seconds  Procedure:  The right arm was prepped with chlorhexidine, draped in the usual sterile fashion using maximum barrier technique (cap and mask, sterile gown, sterile gloves, large sterile sheet, hand hygiene and cutaneous antiseptic).  Local anesthesia was attained by infiltration with 1% lidocaine.  Ultrasound demonstrated patency of the right basilic vein, and this was documented with an image.  Under real-time ultrasound guidance, this vein was accessed with a 21 gauge micropuncture needle and image documentation was performed.  The needle was exchanged over a guidewire for a peel-away sheath through which a 41 cm 5 [place] dual lumen power injectable PICC was advanced, and positioned with its tip at the lower SVC/right atrial junction.  Fluoroscopy during the procedure and fluoro spot radiograph confirms appropriate catheter position.  The catheter was flushed, secured to  the skin with Prolene sutures, and covered with a sterile dressing.  Complications:  None.  The patient tolerated the procedure well.  IMPRESSION:  Successful placement of a right basilic vein approach dual lumen power PICC with sonographic and fluoroscopic guidance.  The catheter is ready for use.  Signed,  Sterling Big, MD [facility] Radiology   Original Report Authenticated By: Malachy Moan, M.D.   Ir US Guide Vasc Access Right  12/15/2012   *RADIOLOGY REPORT*  PICC PLACEMENT WITH ULTRASOUND AND FLUOROSCOPIC  GUIDANCE  Clinical History: 72 year old female with endocarditis in need of durable central venous access for outpatient antibiotic therapy.  Fluoroscopy Time: 6 seconds  Procedure:  The right arm was prepped with chlorhexidine, draped in the usual sterile fashion using maximum barrier technique (cap and mask, sterile gown, sterile gloves, large sterile sheet, hand hygiene and cutaneous antiseptic).  Local anesthesia was attained by infiltration with 1% lidocaine.  Ultrasound demonstrated patency of the right basilic vein, and this was documented with an image.  Under real-time ultrasound guidance, this vein was accessed with a 21 gauge micropuncture needle and image documentation was performed.  The needle was exchanged over a guidewire for a peel-away sheath through which a 41 cm 5 [place] dual lumen power injectable PICC was advanced, and positioned with its tip at the lower SVC/right atrial junction.  Fluoroscopy during the procedure and fluoro spot radiograph confirms appropriate catheter position.  The catheter was flushed, secured to the skin with Prolene sutures, and covered with a sterile dressing.  Complications:  None.  The patient tolerated the procedure well.  IMPRESSION:  Successful placement of a right basilic vein approach dual lumen power PICC with sonographic and fluoroscopic guidance.  The catheter is ready for use.  Signed,   Sterling Big, MD [facility] Rad[facility]   Original Report Authenticated By: Malachy Moan, M.D.   Dg Abd Acute W/chest  12/16/2012   *RADIOLOGY REPORT*  Clinical Data: Vomiting.  ACUTE ABDOMEN SERIES (ABDOMEN 2 VIEW & CHEST 1 VIEW)  Comparison: Chest x-ray 07/21/2012.  Findings: The heart is enlarged.  Previous CABG.  PICC line from right arm approach lies at the cavoatrial junction.  Mild vascular congestion.  Overall improved congestive failure from priors.  No effusion or pneumothorax.  Flat and decubitus abdomen reveals no free  air over the liver.  Gas pattern is nonobstructive.  Moderate vascular calcification is seen.  Degenerative changes lumbar spine with levoconvex scoliosis but no osseous lesions.  IMPRESSION: Moderate vascular congestion without overt failure.  Nonobstructive gas pattern.   Original Report Authenticated By: Davonna Belling, M.D.    CBC  Recent Labs Lab 12/11/12 1428 12/12/12 1357 12/13/12 0418 12/16/12 0845  WBC 7.5 7.3 5.7 8.8  HGB 10.9* 10.5* 9.7* 8.9*  HCT 31.6* 31.2* 28.6* 26.5*  PLT 148* 124* 121* 131*  MCV 88.3 89.7 89.7 90.1  MCH 30.4 30.2 30.4 30.3  MCHC 34.5 33.7 33.9 33.6  RDW 14.5 14.0 13.9 14.4  LYMPHSABS 1.6  --   --  1.2  MONOABS 0.7  --   --  0.9  EOSABS 0.0  --   --  0.1  BASOSABS 0.0  --   --  0.0    Chemistries   Recent Labs Lab 12/11/12 1428 12/12/12 1357 12/13/12 0418 12/14/12 1105 12/15/12 0500 12/16/12 0845 12/16/12 1555  NA 134* 132* 138 134* 134* 132* 134*  K 3.7 3.4* 3.4* 2.9* 4.0 5.4* 4.5  CL 98 96 102 99 102 102 103  CO2 24 23 26 28 23 20 19   GLUCOSE 98 119* 89 105* 98 108* 97  BUN 27* 31* 27* 19 18 17 15   CREATININE 1.21* 1.12* 1.04 0.88 0.99 1.12* 1.04  CALCIUM 8.9 9.0 8.6 8.7 8.6 8.8 8.6  AST 13 14  --   --   --  21  --   ALT <8 5  --   --   --  6  --   ALKPHOS 98 116  --   --   --  112  --   BILITOT 0.8 0.5  --   --   --  0.6  --     ------------------------------------------------------------------------------------------------------------------ estimated creatinine clearance is 46.2 ml/min (by C-G formula based on Cr of 1.04). ------------------------------------------------------------------------------------------------------------------ No results found for this basename: HGBA1C,  in the last 72 hours ------------------------------------------------------------------------------------------------------------------ No results found for this basename: CHOL, HDL, LDLCALC, TRIG, CHOLHDL, LDLDIRECT,  in the last 72 hours ------------------------------------------------------------------------------------------------------------------ No results found for this basename: TSH, T4TOTAL, FREET3, T3FREE, THYROIDAB,  in the last 72 hours ------------------------------------------------------------------------------------------------------------------ No results found for this basename: VITAMINB12, FOLATE, FERRITIN, TIBC, IRON, RETICCTPCT,  in the last 72 hours  Coagulation profile  Recent Labs Lab 12/13/12 0945 12/14/12 0531 12/15/12 0500 12/16/12 0845 12/17/12 0500  INR 1.89* 1.94* 1.82* 1.68* 2.10*    No results found for this basename: DDIMER,  in the last 72 hours  Cardiac Enzymes No results found for this basename: CK, CKMB, TROPONINI, MYOGLOBIN,  in the last 168 hours ------------------------------------------------------------------------------------------------------------------ No components found with this basename: POCBNP,

## 2012-12-17 NOTE — Progress Notes (Signed)
Utilization Review Completed.   Valerya Maxton, RN, BSN Nurse Case Manager  [phone]  

## 2012-12-17 NOTE — Progress Notes (Signed)
ANTICOAGULATION CONSULT NOTE - Follow Up Consult  Pharmacy Consult for warfarin Indication: atrial fibrillation  Allergies  Allergen Reactions  . Ceftriaxone     RASH BUT TOLERATED AMPICILLIN AND IMIPENEM WITHOUT PROBLEMS  . Codeine Nausea Only    Patient Measurements: Height: 5\' 7"  (170.2 cm) Weight: 154 lb 15.7 oz (70.3 kg) IBW/kg (Calculated) : 61.6   Vital Signs: Temp: 99.3 F (37.4 C) (09/24 0650) Temp src: Oral (09/24 0650) BP: 110/65 mmHg (09/24 0650) Pulse Rate: 86 (09/24 0650)  Labs:  Recent Labs  12/15/12 0500 12/16/12 0845 12/16/12 1555 12/17/12 0500  HGB  --  8.9*  --   --   HCT  --  26.5*  --   --   PLT  --  131*  --   --   APTT  --  36  --   --   LABPROT 20.5* 19.3*  --  22.9*  INR 1.82* 1.68*  --  2.10*  CREATININE 0.99 1.12* 1.04  --     Estimated Creatinine Clearance: 46.2 ml/min (by C-G formula based on Cr of 1.04).   Medications:  Scheduled:  . ampicillin (OMNIPEN) IV  2 g Intravenous Q4H  . atorvastatin  20 mg Oral q1800  . digoxin  0.125 mg Oral q morning - 10a  . furosemide  20 mg Oral Daily  . gentamicin  60 mg Intravenous Q12H  . hydroxychloroquine  200 mg Oral BID  . metoprolol  25 mg Oral BID  . ondansetron (ZOFRAN) IV  4 mg Intravenous Once  . pantoprazole  40 mg Oral q morning - 10a  . predniSONE  5 mg Oral QAC breakfast  . rOPINIRole  1 mg Oral QHS  . sodium chloride  10-40 mL Intracatheter Q12H  . sulfaSALAzine  1,000 mg Oral BID  . Warfarin - Pharmacist Dosing Inpatient   Does not apply q1800  . zolpidem  5 mg Oral Once    Assessment: 6 YOF who was just discharged on 9/22 who continues on warfarin for AFib. Her home dose was to be 3mg  daily. INR on admit yesterday was low at 1.68. She received 4mg  last evening and INR increased to 2.1. Her Hgb remains low, though level on admit is lower than previous readings. Plts are also low but this also appears to be a chronic issue. No overt bleeding noted.   Goal of Therapy:   INR 2-3 Monitor platelets by anticoagulation protocol: Yes   Plan:  1. Warfarin 4mg  po x1 tonight 2. Daily PT/INR 3. Follow up for any s/s bleeding  Kamica Florance D. Ulices Maack, PharmD Clinical Pharmacist Pager: [phone] 12/17/2012 8:57 AM

## 2012-12-17 NOTE — Progress Notes (Signed)
[facility]  Patient Status: Pt is active with [facility] this admission.  [facility] is providing the following services: HH RN and home infusion pharmacy for home IV Antibiotics.  [facility] hospital coordinator team will follow pt with in hospital and support DC to home when deemed appropriate by MD.  If patient discharges after hours, please call [phone].   Sedalia Muta 12/17/2012, 12:04 PM

## 2012-12-17 NOTE — Plan of Care (Signed)
Problem: Phase I Progression Outcomes Goal: Voiding-avoid urinary catheter unless indicated Outcome: Not Applicable Date Met:  12/17/12 Pt.voids without urinary catheter.

## 2012-12-17 NOTE — Progress Notes (Signed)
12/17/12 Pt.A/Ox4 and is on oxygen at 2 L [place]. She is 1 person assist to the [facility]. She has had no c/o pain and no signs of distress. When asked she had no c/o N/V during the shift. On 12/16/12 Dr.[name] came in around 2030 to speak with patient.  Pt.is currently on brown contact precautions to rule out c.diff. Pt.'s daughter Zella Ball called twice during the shift to ask how her mother was doing.

## 2012-12-17 NOTE — Progress Notes (Signed)
INITIAL NUTRITION ASSESSMENT  DOCUMENTATION CODES Per approved criteria  -Not Applicable   INTERVENTION: Diet advancement per MD discretion Provide Resource Breeze BID until diet is advanced Provide Magic Cup BID Provide Multivitamin with minerals daily  NUTRITION DIAGNOSIS: Inadequate oral intake related to poor appetite as evidenced by varying PO intake 15-50% of meals.    Goal: Pt to meet >/= 90% of their estimated nutrition needs   Monitor:  Diet advancement PO intake Weight Labs  Reason for Assessment: Malnutrition Screening Tool, score of 5  72 y.o. female  Admitting Dx: <principal problem not specified>  ASSESSMENT: 72 y.o. female with a history of 2 previous episodes of endocarditis, prosthetic mitral valve, Lupus, Pulmonary fibrosis, Afib, NSTEMI, HLD and CAD discharged yesterday came in today with nausea, vomiting and one episode of diarrhea. On arrival to ED, pt was on 2 lit [place] oxygen, slightly dehydrated. Her labs revealed mild renal insufficiency and hyperkalemia.   Pt asleep at time of visit; spoke with pt's daughter in room. Daughter reports that for the short period pt was home she was eating very well but, when the pt doesn't feel well her appetite decreases right away. Daughter reports pt is feeling better today and has desire to eat. Pt was given Ensure Pudding TID during previous admission but, pt hates it per daughter. Per weight history below pt has had 18% wt loss in the past 5 months.   Height: Ht Readings from Last 1 Encounters:  12/16/12 5\' 7"  (1.702 m)    Weight: Wt Readings from Last 1 Encounters:  12/17/12 154 lb 15.7 oz (70.3 kg)    Ideal Body Weight: 135 lbs  % Ideal Body Weight: 114%  Wt Readings from Last 10 Encounters:  12/17/12 154 lb 15.7 oz (70.3 kg)  12/12/12 152 lb 11.2 oz (69.264 kg)  12/11/12 152 lb 9 oz (69.202 kg)  12/09/12 151 lb 4 oz (68.607 kg)  09/15/12 163 lb (73.936 kg)  09/10/12 158 lb (71.668 kg)  08/07/12 168  lb (76.204 kg)  08/07/12 168 lb 9.6 oz (76.476 kg)  07/22/12 188 lb 4.4 oz (85.4 kg)  07/22/12 188 lb 4.4 oz (85.4 kg)    Usual Body Weight: 200 lbs (10 months ago per pt)  % Usual Body Weight: 77%  BMI:  Body mass index is 24.27 kg/(m^2).  Estimated Nutritional Needs: Kcal: 8119-1478 Protein: 75-85 grams Fluid: 1.8-2 L/day  Skin: WDL  Diet Order: Clear Liquid  EDUCATION NEEDS: -No education needs identified at this time   Intake/Output Summary (Last 24 hours) at 12/17/12 1152 Last data filed at 12/17/12 0902  Gross per 24 hour  Intake    900 ml  Output   1075 ml  Net   -175 ml    Last BM: 9/23   Labs:   Recent Labs Lab 12/15/12 0500 12/16/12 0845 12/16/12 1555  NA 134* 132* 134*  K 4.0 5.4* 4.5  CL 102 102 103  CO2 23 20 19   BUN 18 17 15   CREATININE 0.99 1.12* 1.04  CALCIUM 8.6 8.8 8.6  GLUCOSE 98 108* 97    CBG (last 3)  No results found for this basename: GLUCAP,  in the last 72 hours  Scheduled Meds: . ampicillin (OMNIPEN) IV  2 g Intravenous Q4H  . atorvastatin  20 mg Oral q1800  . digoxin  0.125 mg Oral q morning - 10a  . furosemide  20 mg Oral Daily  . gentamicin  60 mg Intravenous Q12H  . hydroxychloroquine  200 mg Oral BID  . metoprolol  25 mg Oral BID  . ondansetron (ZOFRAN) IV  4 mg Intravenous Once  . pantoprazole  40 mg Oral q morning - 10a  . predniSONE  5 mg Oral QAC breakfast  . rOPINIRole  1 mg Oral QHS  . sodium chloride  10-40 mL Intracatheter Q12H  . sulfaSALAzine  1,000 mg Oral BID  . warfarin  4 mg Oral ONCE-1800  . Warfarin - Pharmacist Dosing Inpatient   Does not apply q1800  . zolpidem  5 mg Oral Once    Continuous Infusions: . sodium chloride 10 mL/hr at 12/16/12 2312    Past Medical History  Diagnosis Date  . Coronary artery disease   . Atrial fibrillation     Now NSR  . GERD (gastroesophageal reflux disease)   . Hyperlipidemia   . 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 Anemia   .  Angina   . Shortness of breath     with activity  . Hypertension   . Blood transfusion   . Pneumonia   . H/O hiatal hernia   . Lupus   . Insomnia   . Endocarditis   . Prosthetic valve endocarditis   . Enterococcal bacteremia   . Drug rash     Past Surgical History  Procedure Laterality Date  . Cardiac catheterization    . Cardiac valve replacement      2006  . Coronary artery bypass graft    . Givens capsule study  04/09/2011    Procedure: GIVENS CAPSULE STUDY;  Surgeon: Theda Belfast, MD;  Location: [facility] ENDOSCOPY;  Service: Endoscopy;  Laterality: N/A;  . Tee without cardioversion  02/06/2012    Procedure: TRANSESOPHAGEAL ECHOCARDIOGRAM (TEE);  Surgeon: Ricki Rodriguez, MD;  Location[facility] ENDOSCOPY;  Service: Cardiovascular;  Laterality: N/A;  . Esophagogastroduodenoscopy  02/12/2012    Procedure: ESOPHAGOGASTRODUODENOSCOPY (EGD);  Surgeon: Theda Belfast, MD;  Location: [facility] ENDOSCOPY;  Service: Endoscopy;  Laterality: N/A;  . Tee without cardioversion N/A 07/15/2012    Procedure: TRANSESOPHAGEAL ECHOCARDIOGRAM (TEE);  Surgeon: Ricki Rodriguez, MD;  Location: [facility] ENDOSCOPY;  Service: Cardiovascular;  Laterality: N/A;  . Breast lumpectomy  1977    Ian Malkin RD, LDN Inpatient Clinical Dietitian Pager: [phone] After Hours Pager: [phone]

## 2012-12-17 NOTE — Progress Notes (Addendum)
INFECTIOUS DISEASE PROGRESS NOTE  ID: [name] is a 72 y.o. female with  Active Problems:   * No active hospital problems. *  Subjective: No n/v today. No diarrhea today.   Abtx:  Anti-infectives   Start     Dose/Rate Route Frequency Ordered Stop   12/17/12 1800  gentamicin (GARAMYCIN) IVPB 60 mg     60 mg 100 mL/hr over 30 Minutes Intravenous Every 12 hours 12/17/12 0834     12/16/12 1800  hydroxychloroquine (PLAQUENIL) tablet 200 mg     200 mg Oral 2 times daily 12/16/12 1456     12/16/12 1700  gentamicin (GARAMYCIN) IVPB 60 mg  Status:  Discontinued     60 mg 100 mL/hr over 30 Minutes Intravenous Every 12 hours 12/16/12 1318 12/17/12 0834   12/16/12 1700  ampicillin (OMNIPEN) 2 g in sodium chloride 0.9 % 50 mL IVPB     2 g 150 mL/hr over 20 Minutes Intravenous Every 4 hours 12/16/12 1502     12/16/12 1430  ampicillin (OMNIPEN) 2 g in sodium chloride 0.9 % 50 mL IVPB  Status:  Discontinued     2 g 150 mL/hr over 20 Minutes Intravenous 6 times per day 12/16/12 1318 12/16/12 1503      Medications:  Scheduled: . ampicillin (OMNIPEN) IV  2 g Intravenous Q4H  . atorvastatin  20 mg Oral q1800  . digoxin  0.125 mg Oral q morning - 10a  . feeding supplement  1 Container Oral BID BM  . furosemide  20 mg Oral Daily  . gentamicin  60 mg Intravenous Q12H  . hydroxychloroquine  200 mg Oral BID  . metoprolol  25 mg Oral BID  . ondansetron (ZOFRAN) IV  4 mg Intravenous Once  . pantoprazole  40 mg Oral q morning - 10a  . predniSONE  5 mg Oral QAC breakfast  . rOPINIRole  1 mg Oral QHS  . sodium chloride  10-40 mL Intracatheter Q12H  . sulfaSALAzine  1,000 mg Oral BID  . warfarin  4 mg Oral ONCE-1800  . Warfarin - Pharmacist Dosing Inpatient   Does not apply q1800  . zolpidem  5 mg Oral Once    Objective: Vital signs in last 24 hours: Temp:  [97.3 F (36.3 C)-99.5 F (37.5 C)] 97.3 F (36.3 C) (09/24 1343) Pulse Rate:  [72-92] 72 (09/24 1343) Resp:  [18-24] 20  (09/24 1343) BP: (110-121)/(63-71) 114/66 mmHg (09/24 1343) SpO2:  [92 %-98 %] 95 % (09/24 1343) Weight:  [70.3 kg (154 lb 15.7 oz)] 70.3 kg (154 lb 15.7 oz) (09/24 0334)   General appearance: alert, cooperative, no distress and pale Resp: clear to auscultation bilaterally Cardio: irregularly irregular rhythm GI: normal findings: bowel sounds normal and soft, non-tender  Lab Results  Recent Labs  12/16/12 0845 12/16/12 1555  WBC 8.8  --   HGB 8.9*  --   HCT 26.5*  --   NA 132* 134*  K 5.4* 4.5  CL 102 103  CO2 20 19  BUN 17 15  CREATININE 1.12* 1.04   Liver Panel  Recent Labs  12/16/12 0845  PROT 6.9  ALBUMIN 2.5*  AST 21  ALT 6  ALKPHOS 112  BILITOT 0.6   Sedimentation Rate No results found for this basename: ESRSEDRATE,  in the last 72 hours C-Reactive Protein No results found for this basename: CRP,  in the last 72 hours  Microbiology: Recent Results (from the past 240 hour(s))  CULTURE, BLOOD (  SINGLE)     Status: None   Collection Time    12/09/12 11:40 AM      Result Value Range Status   Organism ID, Bacteria ENTEROCOCCUS SPECIES   Final   Comment: Gram Stain Report Called to,Read Back By     and Verified With:     DR VAN DAM 1115 12/12/12 BY KRAWS     Susceptibilities performed on previous culture     within the last 5 days.  CULTURE, BLOOD (SINGLE)     Status: None   Collection Time    12/09/12 11:40 AM      Result Value Range Status   Culture ENTEROCOCCUS SPECIES   Final   Organism ID, Bacteria ENTEROCOCCUS SPECIES   Final   Comment: Corrected results called to:     DR VAN DAM 1115 12/12/12 BY KRAWS     Previously reported as Gram Negative Coccobacilli     Combination Therapy of high-dose Ampicillin or     Vancomycin,plus an aminoglycoside,is usually     indicated for serious Enterococcal infections.     Gram Stain Report Called to,Read Back By     and Verified With:     DR VAN DAM 12/11/12 1015 BY SMITHERSJ  CULTURE, BLOOD (SINGLE)      Status: None   Collection Time    12/11/12  2:28 PM      Result Value Range Status   Organism ID, Bacteria ENTEROCOCCUS SPECIES   Final   Comment: Susceptibilities performed on previous culture     within the last 5 days.     Gram Stain Report Called to,Read Back By     and Verified With:     DR VAN DAM 12/15/12 1520 BY SMITHERSJ  CULTURE, BLOOD (SINGLE)     Status: None   Collection Time    12/11/12  2:28 PM      Result Value Range Status   Organism ID, Bacteria ENTEROCOCCUS SPECIES   Final   Comment: Susceptibilities performed on previous culture     within the last 5 days.     Gram Stain Report Called to,Read Back By     and Verified With:     DR VAN DAM 12/15/12 1518 BY SMITHERSJ  CULTURE, BLOOD (ROUTINE X 2)     Status: None   Collection Time    12/13/12  8:30 AM      Result Value Range Status   Specimen Description BLOOD LEFT ARM   Final   Special Requests BOTTLES DRAWN AEROBIC AND ANAEROBIC 10CC   Final   Culture  Setup Time     Final   Value: 12/14/2012 13:27     Performed at [facility]   Culture     Final   Value:        BLOOD CULTURE RECEIVED NO GROWTH TO DATE CULTURE WILL BE HELD FOR 5 DAYS BEFORE ISSUING A FINAL NEGATIVE REPORT     Performed at [facility]   Report Status PENDING   Incomplete  CULTURE, BLOOD (ROUTINE X 2)     Status: None   Collection Time    12/13/12  9:45 AM      Result Value Range Status   Specimen Description BLOOD LEFT ARM   Final   Special Requests BOTTLES DRAWN AEROBIC AND ANAEROBIC 10CC   Final   Culture  Setup Time     Final   Value: 12/14/2012 13:27     Performed at [facility]   Culture     Final   Value:        BLOOD CULTURE RECEIVED NO GROWTH TO DATE CULTURE WILL BE HELD FOR 5 DAYS BEFORE ISSUING A FINAL NEGATIVE REPORT     Performed at [facility]   Report Status PENDING   Incomplete    Studies/Results: Dg Abd Acute W/chest  12/16/2012   *RADIOLOGY REPORT*  Clinical Data: Vomiting.  ACUTE  ABDOMEN SERIES (ABDOMEN 2 VIEW & CHEST 1 VIEW)  Comparison: Chest x-ray 07/21/2012.  Findings: The heart is enlarged.  Previous CABG.  PICC line from right arm approach lies at the cavoatrial junction.  Mild vascular congestion.  Overall improved congestive failure from priors.  No effusion or pneumothorax.  Flat and decubitus abdomen reveals no free air over the liver.  Gas pattern is nonobstructive.  Moderate vascular calcification is seen.  Degenerative changes lumbar spine with levoconvex scoliosis but no osseous lesions.  IMPRESSION: Moderate vascular congestion without overt failure.  Nonobstructive gas pattern.   Original Report Authenticated By: Davonna Belling, M.D.     Assessment/Plan: Enterococcal Ao prosthetic endocarditis Dehydration, n/v, diarrhea Pyuria  Total days of antibiotics 6 (amp/gent)  Await UCx She is improving.  Cr better c diff pending.  Etiology unclear so far.          Johny Sax Infectious Diseases (pager) [phone] [email or web address] 12/17/2012, 3:16 PM  LOS: 1 day

## 2012-12-18 ENCOUNTER — Encounter (HOSPITAL_COMMUNITY): Payer: Self-pay | Admitting: Physician Assistant

## 2012-12-18 DIAGNOSIS — D649 Anemia, unspecified: Secondary | ICD-10-CM

## 2012-12-18 DIAGNOSIS — T827XXA Infection and inflammatory reaction due to other cardiac and vascular devices, implants and grafts, initial encounter: Secondary | ICD-10-CM

## 2012-12-18 DIAGNOSIS — R195 Other fecal abnormalities: Secondary | ICD-10-CM

## 2012-12-18 LAB — BASIC METABOLIC PANEL
BUN: 20 mg/dL (ref 6–23)
CO2: 20 mEq/L (ref 19–32)
Chloride: 108 mEq/L (ref 96–112)
Creatinine, Ser: 1.23 mg/dL — ABNORMAL HIGH (ref 0.50–1.10)
GFR calc Af Amer: 49 mL/min — ABNORMAL LOW (ref >90)
GFR calc non Af Amer: 42 mL/min — ABNORMAL LOW (ref >90)

## 2012-12-18 LAB — CBC
HCT: 23.9 % — ABNORMAL LOW (ref 36.0–46.0)
MCHC: 33.1 g/dL (ref 30.0–36.0)
MCV: 91.2 fL (ref 78.0–100.0)
Platelets: 115 10*3/uL — ABNORMAL LOW (ref 150–400)
RDW: 14.4 % (ref 11.5–15.5)
WBC: 4.7 10*3/uL (ref 4.0–10.5)

## 2012-12-18 LAB — URINE CULTURE
Colony Count: NO GROWTH
Culture: NO GROWTH
Special Requests: NORMAL

## 2012-12-18 LAB — PROTIME-INR: INR: 2.91 — ABNORMAL HIGH (ref 0.00–1.49)

## 2012-12-18 MED ORDER — PANTOPRAZOLE SODIUM 40 MG IV SOLR
40.0000 mg | Freq: Two times a day (BID) | INTRAVENOUS | Status: DC
  Administered 2012-12-18: 13:00:00 40 mg via INTRAVENOUS
  Filled 2012-12-18 (×3): qty 40

## 2012-12-18 MED ORDER — GENTAMICIN IN SALINE 1.6-0.9 MG/ML-% IV SOLN
80.0000 mg | INTRAVENOUS | Status: DC
  Administered 2012-12-19 – 2012-12-20 (×2): 80 mg via INTRAVENOUS
  Filled 2012-12-18 (×3): qty 50

## 2012-12-18 MED ORDER — DIPHENHYDRAMINE HCL 25 MG PO CAPS
25.0000 mg | ORAL_CAPSULE | Freq: Once | ORAL | Status: AC
  Administered 2012-12-18: 25 mg via ORAL
  Filled 2012-12-18: qty 1

## 2012-12-18 MED ORDER — WARFARIN SODIUM 1 MG PO TABS
1.0000 mg | ORAL_TABLET | Freq: Once | ORAL | Status: AC
  Administered 2012-12-18: 1 mg via ORAL
  Filled 2012-12-18: qty 1

## 2012-12-18 MED ORDER — PANTOPRAZOLE SODIUM 40 MG PO TBEC
40.0000 mg | DELAYED_RELEASE_TABLET | Freq: Every day | ORAL | Status: DC
  Administered 2012-12-19 – 2012-12-20 (×2): 40 mg via ORAL
  Filled 2012-12-18 (×2): qty 1

## 2012-12-18 NOTE — Progress Notes (Signed)
ANTICOAGULATION CONSULT NOTE - Follow Up Consult  Pharmacy Consult for warfarin Indication: atrial fibrillation  Allergies  Allergen Reactions  . Ceftriaxone     RASH BUT TOLERATED AMPICILLIN AND IMIPENEM WITHOUT PROBLEMS  . Codeine Nausea Only    Patient Measurements: Height: 5\' 7"  (170.2 cm) Weight: 153 lb 14.1 oz (69.8 kg) IBW/kg (Calculated) : 61.6   Vital Signs: Temp: 97.9 F (36.6 C) (09/25 0503) Temp src: Oral (09/25 0503) BP: 130/73 mmHg (09/25 0503) Pulse Rate: 65 (09/25 0503)  Labs:  Recent Labs  12/16/12 0845 12/16/12 1555 12/17/12 0500 12/18/12 0525  HGB 8.9*  --   --  7.9*  HCT 26.5*  --   --  23.9*  PLT 131*  --   --  115*  APTT 36  --   --   --   LABPROT 19.3*  --  22.9* 29.4*  INR 1.68*  --  2.10* 2.91*  CREATININE 1.12* 1.04  --  1.23*    Estimated Creatinine Clearance: 39 ml/min (by C-G formula based on Cr of 1.23).   Medications:  Scheduled:  . ampicillin (OMNIPEN) IV  2 g Intravenous Q4H  . atorvastatin  20 mg Oral q1800  . digoxin  0.125 mg Oral q morning - 10a  . feeding supplement  1 Container Oral BID BM  . furosemide  20 mg Oral Daily  . gentamicin  60 mg Intravenous Q12H  . hydroxychloroquine  200 mg Oral BID  . metoprolol  25 mg Oral BID  . ondansetron (ZOFRAN) IV  4 mg Intravenous Once  . pantoprazole  40 mg Oral q morning - 10a  . predniSONE  5 mg Oral QAC breakfast  . rOPINIRole  1 mg Oral QHS  . sodium chloride  10-40 mL Intracatheter Q12H  . sulfaSALAzine  1,000 mg Oral BID  . Warfarin - Pharmacist Dosing Inpatient   Does not apply q1800  . zolpidem  5 mg Oral Once    Assessment: 75 YOF who was just discharged on 9/22 who continues on warfarin for AFib. Her home dose was to be 3mg  daily. INR on admit 1.68. Now has received 2 doses of 4mg  of warfarin and INR today is 2.91- drastic increase overnight. Her Hgb continues to fall and stool guaiac to be checked. Was on iron PTA, but as her ferritin was elevated recently, do  not believe non-adequate iron stores are the driving force behind drop in Hgb. platelets have also decreased since admission. No overt bleeding noted.  Goal of Therapy:  INR 2-3 Monitor platelets by anticoagulation protocol: Yes   Plan:  1. Warfarin 1mg  po x1 tonight 2. Daily PT/INR 3. Follow up for any s/s bleeding  [name] D. Cassity Christian, PharmD Clinical Pharmacist Pager: [phone] 12/18/2012 9:49 AM

## 2012-12-18 NOTE — Consult Note (Addendum)
[facility] Consult: 1:31 PM 12/18/2012   Referring Provider: Dr Jesse Fall  Primary Care Physician:  [name], MD Primary Gastroenterologist:  Dr Elnoria Howard.  Dr Elnoria Howard was initial and subsequent consultant in Jan and Nov 2013. He has never seen her in office so will not see her now.   Reason for Consultation:  Anemia.  Nausea and vomiting.   HPI: [name] is a 72 y.o. female.  Multiple medical problems including lupus, pulmonary fibrosis, morbid obesity, atrial fibrillation requiring chronic anticoagulation (currently Coumadin), DVT, CAD, endocarditis x 3, s/p CABG and bioprosthetic aortic valve.  NSTEMI.   GI wise has 2013 hx of transfusion requiring anemia and FOB + stools in setting of supratherapeutic INR as well as Eliquis therapy.  EGD, colonoscopy, Given capsule endoscopy in 03/2011, EGD in 01/2012.  Findings of gastric polyps.  These were resected and post resection bleeding treated with endoclips at endoscopy in 01/2012,   Pt just admitted 9/19 -9/22 with presumed enterococcus endocarditis. + blood cultures.  Discharged on 6 weeks of Ampicillin and gentamycin. Discharge summary mentions seer protein malnutrition   Admitted 9/23 with less than 24 hours of n/v/d.  Hgb of 8.9, down to 7.9 today.  Was ~10.5 on recent admit but as low as 7.7 in 06/2012.   MCV is 91. FOB tests were + 12/16/12, 01/2012 but at other times has tested FOB negative.   She has not had melena or bleeding per rectum.  Stool was brown and loose on day of admit, no BMs again until today when staff did not get her to commode in time and she had incontinence of loose stool. At home she was having formed stool Says that she has dropped about 80# in last 2 years.  Appetite variable, does not always eat 3 meals per day.  Relies on family to bring her meals.  No nausea, no heartburn.  No bleeding.  Takes her Protonix daily.      Previous ENDOSCOPIC STUDIES: 01/2012   EGD  Dr Elnoria Howard For:  Anemia and Heme positive stool.  IMPRESSION:  1) Bleeding antral polyps s/p resection. Removed with a hot snare.  One of the sites continued to ooze and two hemoclips were placed.  No further bleeding was noted. 2) Candidal esophagitis.  3) Nonobstructive Schatzki's ring.  4) Hiatal hernia 2-3 cm.  RECOMMENDATIONS: 1) Follow HGB and transfuse as necessary.  2) Avoid anticoagulation if possible for a week, however, ASA is  okay.  3) Fluconazole x 10 days.  03/2011  EGD  Dr Juanda Chance INDICATIONS: anemia, hemoccult positive stool INR 7.0 on  admission, pt on Coumadin for at.fib ENDOSCOPIC IMPRESSION:  1) Polyps, multiple  2) Mild gastritis  3) Schatzki's ring  4) Otherwise normal examination  atrophic appearing gastric mucosa, r/o Pernicious anemia,  biopsies taken  nothing to account for GIB  RECOMMENDATIONS:  1) Await biopsy results  colonoscopy  03/2011 Colonoscopy  Dr Juanda Chance INDICATIONS: Anemia, heme positive stool at.fib/Coumadin, INR on  admission was supratherapeutic ENDOSCOPIC IMPRESSION:  1) Diverticulum  2) Otherwise normal examination  no active or recent bleeding, appendiceal opening not reached  although cecal pouch visualized  RECOMMENDATIONS:  resume Coumadin, followH/H, if bleeding continues consider SBCE   1/203 Givens capsule Study Negative per Dr Haywood Pao consult note of 02/12/12    Past Medical History  Diagnosis Date  . Coronary artery disease   . Atrial fibrillation     Now NSR  . GERD (gastroesophageal reflux disease)   . Hyperlipidemia   .  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 Anemia   . Angina   . Hypertension   . Blood transfusion     "related to bad case of bronchitis once; 1 yr ago given due to GIB" (12/17/2012)  . H/O hiatal hernia   . Lupus     "that's compromised her lungs somewhat" (12/17/2012)  . Insomnia   . Endocarditis   . Prosthetic valve endocarditis   . Enterococcal bacteremia   .  Heart murmur   . CHF (congestive heart failure)   . Pneumonia     "once or twice" (12/17/2012)  . Chronic bronchitis     "used to get it once/yr; hasn't had it since ~ 1986" (12/17/2012)  . Shortness of breath     "all the times sometimes" (12/17/2012)  . On home oxygen therapy     "2L prn; always at night" (12/17/2012)  . Lower GI bleed ~ 01/2012  . Arthritis     "hands" (12/17/2012)  . 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 Givens capsule study  04/09/2011    Procedure: GIVENS CAPSULE STUDY;  Surgeon: Theda Belfast, MD;  Location: [facility] ENDOSCOPY;  Service: Endoscopy;  Laterality: N/A;  . Tee without cardioversion  02/06/2012    Procedure: TRANSESOPHAGEAL ECHOCARDIOGRAM (TEE);  Surgeon: Ricki Rodriguez, MD;  Location: [facility] ENDOSCOPY;  Service: Cardiovascular;  Laterality: N/A;  . Esophagogastroduodenoscopy  02/12/2012    Procedure: ESOPHAGOGASTRODUODENOSCOPY (EGD);  Surgeon: Theda Belfast, MD;  Location: [facility] ENDOSCOPY;  Service: Endoscopy;  Laterality: N/A;  . Tee without cardioversion N/A 07/15/2012    Procedure: TRANSESOPHAGEAL ECHOCARDIOGRAM (TEE);  Surgeon: Ricki Rodriguez, MD;  Location: [facility] ENDOSCOPY;  Service: Cardiovascular;  Laterality: N/A;  . Breast lumpectomy Left 1977    "benign" (12/17/2012)  . Tonsillectomy  1940's  . Coronary artery bypass graft  2006  . Cardiac valve replacement  2006    "aortic" (12/17/2012)  . Cardiac catheterization      "probably 2-3" (12/17/2012)  . Coronary angioplasty with stent placement      "several put in over the years" (12/17/2012)  . Cataract extraction w/ intraocular lens  implant, bilateral Bilateral 11/2000  . Peripherally inserted central catheter insertion Right 12/15/2012    "upper arm" (12/17/2012    Prior to Admission medications   Medication Sig Start Date End Date Taking? Authorizing Provider  digoxin (LANOXIN) 0.125 MG tablet Take 0.125 mg by mouth every  morning.   Yes Historical Provider, MD  furosemide (LASIX) 40 MG tablet Take 40 mg by mouth every morning.   Yes Historical Provider, MD  gentamicin (GARAMYCIN) 1.2-0.9 MG/ML-% Inject 50 mLs (60 mg total) into the vein every 12 (twelve) hours. 12/15/12  Yes Clydia Llano, MD  hydroxychloroquine (PLAQUENIL) 200 MG tablet Take 200 mg by mouth 2 (two) times daily.    Yes Historical Provider, MD  IRON PO Take 1 tablet by mouth 2 (two) times daily.   Yes Historical Provider, MD  metoprolol (LOPRESSOR) 50 MG tablet Take 0.5 tablets (25 mg total) by mouth 2 (two) times daily. 07/22/12  Yes [name], MD  pantoprazole (PROTONIX) 40 MG tablet Take 40 mg by mouth every morning.   Yes Historical Provider, MD  potassium chloride SA (K-DUR,KLOR-CON) 20 MEQ tablet Take 40-60 mEq by mouth 2 (two) times daily. Takes 2 tablets every morning and takes 3 tablets  every evening.   Yes Historical Provider, MD  predniSONE (DELTASONE) 5 MG tablet Take 1 tablet (5 mg total) by mouth every morning. 08/07/12  Yes Storm Frisk, MD  rOPINIRole (REQUIP) 1 MG tablet Take 1 mg by mouth at bedtime.    Yes Historical Provider, MD  rosuvastatin (CRESTOR) 10 MG tablet Take 10 mg by mouth every morning.    Yes Historical Provider, MD  sodium chloride 0.9 % SOLN 50 mL with ampicillin 2 G SOLR 2 g Inject 2 g into the vein every 4 (four) hours. 12/15/12  Yes Clydia Llano, MD  sulfaSALAzine (AZULFIDINE) 500 MG tablet Take 1,000 mg by mouth 2 (two) times daily.    Yes Historical Provider, MD  warfarin (COUMADIN) 3 MG tablet Take 3 mg by mouth every morning.   Yes Historical Provider, MD    Scheduled Meds: . ampicillin (OMNIPEN) IV  2 g Intravenous Q4H  . atorvastatin  20 mg Oral q1800  . digoxin  0.125 mg Oral q morning - 10a  . feeding supplement  1 Container Oral BID BM  . furosemide  20 mg Oral Daily  . gentamicin  60 mg Intravenous Q12H  . hydroxychloroquine  200 mg Oral BID  . metoprolol  25 mg Oral BID  . ondansetron  (ZOFRAN) IV  4 mg Intravenous Once  . pantoprazole (PROTONIX) IV  40 mg Intravenous Q12H  . predniSONE  5 mg Oral QAC breakfast  . rOPINIRole  1 mg Oral QHS  . sodium chloride  10-40 mL Intracatheter Q12H  . sulfaSALAzine  1,000 mg Oral BID  . warfarin  1 mg Oral ONCE-1800  . Warfarin - Pharmacist Dosing Inpatient   Does not apply q1800  . zolpidem  5 mg Oral Once   Infusions: . sodium chloride 10 mL/hr at 12/16/12 2312   PRN Meds: promethazine, sodium chloride   Allergies as of 12/16/2012 - Review Complete 12/16/2012  Allergen Reaction Noted  . Ceftriaxone  09/10/2012  . Codeine Nausea Only     Family History  Problem Relation Age of Onset  . Heart disease Mother     CHF  . Mental illness Father     suicide  . Cancer Sister     pancreatic cancer  . COPD Brother     History   Social History  . Marital Status: Divorced    Spouse Name: N/A    Number of Children: N/A  . Years of Education: N/A   Occupational History  . Not on file.   Social History Main Topics  . Smoking status: Never Smoker   . Smokeless tobacco: Never Used  . Alcohol Use: No  . Drug Use: No  . Sexual Activity: No   Other Topics Concern  . Not on file   Social History Narrative  . No narrative on file    REVIEW OF SYSTEMS: Per HPI No dysuria but urine smells terrible since on the abx No sores or itching No joint pain or swelling No falls.  Not dizzy.  Feels capable of walking to BR.  No dyspnea, no PND, no limb swelling.  No palpitations.   Full dentures.  No nose bleeds, no large bruises.  No nose bleeds.   PHYSICAL EXAM: Vital signs in last 24 hours: Temp:  [97.3 F (36.3 C)-98 F (36.7 C)] 97.9 F (36.6 C) (09/25 0503) Pulse Rate:  [65-72] 68 (09/25 1039) Resp:  [16-20] 18 (09/25 0503) BP: (102-130)/(59-73) 102/61 mmHg (09/25 1038) SpO2:  [94 %-96 %]  94 % (09/25 0503) Weight:  [69.8 kg (153 lb 14.1 oz)] 69.8 kg (153 lb 14.1 oz) (09/25 0336)  General: pleasant, pale  elderly WF.  Comfortable.  Does not look ill. Head:  No swelling  Eyes:  No icterus or pallor Ears:  Not HOH  Nose:  No discharge Mouth:  Clear and moist.  Full set of dentures in place Neck:  No mass, no bruits Lungs:  Clear bil.  No cough.  No dyspnea Heart: irregular  No MRG. Abdomen:  Soft, not tender.  BS active.  No mass or HSM.  No hematoma.   Rectal: loose brown stool is FOB negative.  Small external hemorrhoids.    Musc/Skeltl: no joint deformity or redness Extremities:  No pedal edema.  3+ pedal pulses  Neurologic:  No confusion.  No tremor.  Moves all 4s.  Able to provide adequate history Skin:  No rash, no sores.  hyperpigmented lesions on inner calves.   Tattoos:  none Nodes:  No cervical adenopathy.    Psych:  Pleasant, cooperative.  Fully alert.  Not anxious.   Intake/Output from previous day: 09/24 0701 - 09/25 0700 In: 360 [P.O.:360] Out: 200 [Urine:200] Intake/Output this shift: Total I/O In: 240 [P.O.:240] Out: 200 [Urine:200]  LAB RESULTS:  Recent Labs  12/16/12 0845 12/18/12 0525  WBC 8.8 4.7  HGB 8.9* 7.9*  HCT 26.5* 23.9*  PLT 131* 115*   BMET Lab Results  Component Value Date   NA 139 12/18/2012   NA 134* 12/16/2012   NA 132* 12/16/2012   K 4.5 12/18/2012   K 4.5 12/16/2012   K 5.4* 12/16/2012   CL 108 12/18/2012   CL 103 12/16/2012   CL 102 12/16/2012   CO2 20 12/18/2012   CO2 19 12/16/2012   CO2 20 12/16/2012   GLUCOSE 78 12/18/2012   GLUCOSE 97 12/16/2012   GLUCOSE 108* 12/16/2012   BUN 20 12/18/2012   BUN 15 12/16/2012   BUN 17 12/16/2012   CREATININE 1.23* 12/18/2012   CREATININE 1.04 12/16/2012   CREATININE 1.12* 12/16/2012   CALCIUM 8.0* 12/18/2012   CALCIUM 8.6 12/16/2012   CALCIUM 8.8 12/16/2012   LFT  Recent Labs  12/16/12 0845  PROT 6.9  ALBUMIN 2.5*  AST 21  ALT 6  ALKPHOS 112  BILITOT 0.6   PT/INR Lab Results  Component Value Date   INR 2.91* 12/18/2012   INR 2.10* 12/17/2012   INR 1.68* 12/16/2012      IMPRESSION: *   Acute on chronic anemia.  Normal MCV.  FOB + once on 9/23 but FOB negative by my assay at bedside. No melena. If this is ABL anemia from GI blood loss would expect to see frank blood, or melena neither of which has been seen.   *  N/V/D.  These have for the most part resolved.  ? Viral gastroenteritis?  Not having enough stools to qulify for infectious diarrhea.  *  Hx gastric polyps, endoscopically resected in 2013.  These had caused heme+ anemia.  *  Enterococcal + bacteremia, presumed enterococcal endocarditis. On 6 weeks of abx *  Chronic Coumadin:  Hx of a fib, DVT and prosthetic MVR *  Thrombocytopenia.  This is chronic dating to 2012. *  Lupus, treated with multiple meds including chronic prednisone. Marland Kitchen   PLAN: *  heart healthy diet ordered.  *  Add probiotics given that she needs abx for another several weeks? *  Any decision re endoscopes:  Per Dr  Russella Dar.  *  q 12 hour H & H.  *  Put her back on oral Protonix, since no gross melena or BPR BID IV Protonix not indicated.     LOS: 2 days   Jennye Moccasin  12/18/2012, 1:31 PM Pager: [phone]      Attending physician's note   I have taken a history, examined the patient and reviewed the chart. I agree with the Advanced Practitioner's note, impression and recommendations. Acute on chronic anemia. Intermittent heme + stool. Extensive GI evaluation in 2013 including colonoscopy, EGD with gastric polyps removed and capsule endoscopy. Likely has anemia of chronic disease. No need to repeat endoscopic evaluation as this was performed last year. Transfuse as indicated. PPI daily. Consider Hematology consultation to further evaluate her anemia. GI signing off.   Meryl Dare, MD Clement[facility] Graham

## 2012-12-18 NOTE — Progress Notes (Signed)
ANTIBIOTIC CONSULT NOTE - Follow up  Pharmacy Consult for Gentamicin and Ampicillin Indication: Recurrent Amp S enterococcal endocarditis   Allergies  Allergen Reactions  . Ceftriaxone     RASH BUT TOLERATED AMPICILLIN AND IMIPENEM WITHOUT PROBLEMS  . Codeine Nausea Only    Assessment:  Gentamicin steady state peak and trough measured on current regimen of 60 mg IV q12h and ampicillin 2 g IV q4h (day #3) for recurrent Amp sensitive enterococcal endocarditis in this 72 y.o. female.   Gentamicin trough = 1.4 mcg/ml drawn at 1755 on 9/24 Gent 60 mg IV dose given @ 1844, infused over 30 min.,  Gent peak = 3.3 mcg/ml, drawn at 2040 on 9/24  SCr = trending up 1.04>1.23 Population kinetics: eCrCl=44.2 Ke= 0.086 T1/2=8hr  Goal of Therapy: Gent peak 3-4 mcg/ml Gent trough < 1 mcg/ml  Plan: Change Gentamicin 80 mg IV q24 hours.  Follow renal function closely.  If Scr rises will need to recheck levels early.  No change in ampicillin dose.    Jahlani Lorentz M. Allena Katz, PharmD Clinical Pharmacist- Resident Pager: [phone] Pharmacy: [phone] 12/18/2012 5:14 PM

## 2012-12-18 NOTE — Progress Notes (Signed)
[facility]                                                                                Patient Demographics  [name], is a 72 y.o. female, DOB - [birth year], ZOX:[id number]  Admit date - 12/16/2012   Admitting Physician Kathlen Mody, MD  Outpatient Primary MD for the patient is [name], MD  LOS - 2   Chief Complaint  Patient presents with  . Emesis  . Weakness        Assessment & Plan  1. Anemia, possibly secondary to GI losses  We will obtain a fecal occult blood test as well as monitor her H. and H. every 4 hours. We'll discontinue her Protonix by mouth and start her on Protonix IV twice a day 40 mg.  May consider consulting GI.  2.Nausea/ vomiting Diarrhea:   Improved and resolved.  Still pending C. difficile PCR and stool cultures. Although patient has not had a stool.  -May be secondary to her antibiotics due to recent hospitalization.   Although we'll continue her antibiotics. Due to endocarditis. Infectious disease following.   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 He was seen by infectious disease he was put on ampicillin and gentamicin. Antibiotics should be continued for a total of 6 weeks of therapy. Patient did not have TEE or evaluation by cardiology because she is deemed she is not a surgical candidate. This is her third episode of endocarditis.   Patient has PICC line in place we'll also interrogate PICC line.  4. 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   5. Atrial fibrillation: Patient is on chronic Coumadin therapy, her rate is controlled with digoxin.   6. Severe protein calorie malnutrition: Secondary to her chronic illness.   7. Enterococcus bacteremia: With resultant endocarditis, susceptibility showed ampicillin sensitive. Patient is getting treatment with ampicillin and gentamicin.   8. Pulmonary  congestion; stop fluids and resume lasix at a low dose.   9.Pyuria  -Urine culture still pending.  DVT prophylaxis- on coumadin with pharmacy to follow.   Principal Problem:   Anemia associated with acute blood loss Active Problems:   Atrial fibrillation   Endocarditis   Bacteremia   Nausea with vomiting   Lupus   Severe malnutrition   Code Status: Full   Family Communication: Spoke with daughter via phone [phone].  Per daughter, patient did have blood in her stool a few days ago.  She states that her mother had a colonoscopy about 1 year ago.     Disposition Plan: Admitted.     Procedures None   Consults  Infectious Disease   DVT Prophylaxis Warfarin   Lab Results  Component Value Date   PLT 115* 12/18/2012    Medications  Scheduled Meds: . ampicillin (OMNIPEN) IV  2 g Intravenous Q4H  . atorvastatin  20 mg Oral q1800  . digoxin  0.125 mg Oral q morning - 10a  . feeding supplement  1 Container Oral BID BM  . furosemide  20 mg Oral Daily  . gentamicin  60 mg Intravenous Q12H  .  hydroxychloroquine  200 mg Oral BID  . metoprolol  25 mg Oral BID  . ondansetron (ZOFRAN) IV  4 mg Intravenous Once  . pantoprazole (PROTONIX) IV  40 mg Intravenous Q12H  . predniSONE  5 mg Oral QAC breakfast  . rOPINIRole  1 mg Oral QHS  . sodium chloride  10-40 mL Intracatheter Q12H  . sulfaSALAzine  1,000 mg Oral BID  . warfarin  1 mg Oral ONCE-1800  . Warfarin - Pharmacist Dosing Inpatient   Does not apply q1800  . zolpidem  5 mg Oral Once   Continuous Infusions: . sodium chloride 10 mL/hr at 12/16/12 2312   PRN Meds:.promethazine, sodium chloride  Antibiotics    Anti-infectives   Start     Dose/Rate Route Frequency Ordered Stop   12/17/12 1800  gentamicin (GARAMYCIN) IVPB 60 mg     60 mg 100 mL/hr over 30 Minutes Intravenous Every 12 hours 12/17/12 0834     12/16/12 1800  hydroxychloroquine (PLAQUENIL) tablet 200 mg     200 mg Oral 2 times daily 12/16/12 1456      12/16/12 1700  gentamicin (GARAMYCIN) IVPB 60 mg  Status:  Discontinued     60 mg 100 mL/hr over 30 Minutes Intravenous Every 12 hours 12/16/12 1318 12/17/12 0834   12/16/12 1700  ampicillin (OMNIPEN) 2 g in sodium chloride 0.9 % 50 mL IVPB     2 g 150 mL/hr over 20 Minutes Intravenous Every 4 hours 12/16/12 1502     12/16/12 1430  ampicillin (OMNIPEN) 2 g in sodium chloride 0.9 % 50 mL IVPB  Status:  Discontinued     2 g 150 mL/hr over 20 Minutes Intravenous 6 times per day 12/16/12 1318 12/16/12 1503       Time Spent in minutes   35 minutes   Majestic Molony D.O. on 12/18/2012 at 11:41 AM  Between 7am to 7pm - Pager - [phone]  After 7pm go to [email or web address] - password [email or web address]  And look for the night coverage person covering for me after hours  [facility] Office  [phone]    Subjective:   [name] today has, No headache, No chest pain, No abdominal pain - No Nausea, No new weakness tingling or numbness, No Cough.  Some SOB when lying down.  States dizziness and nausea have subsided.  She denies blood per rectum or with stool, although states she has not had a bowel movement.    Objective:   Filed Vitals:   12/18/12 0336 12/18/12 0503 12/18/12 1038 12/18/12 1039  BP:  130/73 102/61   Pulse:  65 68 68  Temp:  97.9 F (36.6 C)    TempSrc:  Oral    Resp:  18    Height:      Weight: 69.8 kg (153 lb 14.1 oz)     SpO2:  94%      Wt Readings from Last 3 Encounters:  12/18/12 69.8 kg (153 lb 14.1 oz)  12/12/12 69.264 kg (152 lb 11.2 oz)  12/11/12 69.202 kg (152 lb 9 oz)     Intake/Output Summary (Last 24 hours) at 12/18/12 1141 Last data filed at 12/18/12 2956  Gross per 24 hour  Intake    480 ml  Output    400 ml  Net     80 ml    Exam General: Awake Alert, Oriented X 3, No new deficits, Normal affect  HEENT: [place]/AT,PERRAL  Neck: Supple Neck,No JVD, No cervical lymphadenopathy appriciated.  Respiratory:  Symmetrical Chest wall movement,  Good air movement bilaterally, CTAB  Cardiovascular: RRR. No Gallops,Rubs or new Murmurs  Abdomen: Soft, nontender, nondistended, positive bowel sounds.  Extremities:No Cyanosis, Clubbing, edema Neuro: Grossly intact, nonfocal.    Data Review   Micro Results Recent Results (from the past 240 hour(s))  CULTURE, BLOOD (SINGLE)     Status: None   Collection Time    12/09/12 11:40 AM      Result Value Range Status   Organism ID, Bacteria ENTEROCOCCUS SPECIES   Final   Comment: Gram Stain Report Called to,Read Back By     and Verified With:     DR VAN DAM 1115 12/12/12 BY KRAWS     Susceptibilities performed on previous culture     within the last 5 days.  CULTURE, BLOOD (SINGLE)     Status: None   Collection Time    12/09/12 11:40 AM      Result Value Range Status   Culture ENTEROCOCCUS SPECIES   Final   Organism ID, Bacteria ENTEROCOCCUS SPECIES   Final   Comment: Corrected results called to:     DR VAN DAM 1115 12/12/12 BY KRAWS     Previously reported as Gram Negative Coccobacilli     Combination Therapy of high-dose Ampicillin or     Vancomycin,plus an aminoglycoside,is usually     indicated for serious Enterococcal infections.     Gram Stain Report Called to,Read Back By     and Verified With:     DR VAN DAM 12/11/12 1015 BY SMITHERSJ  CULTURE, BLOOD (SINGLE)     Status: None   Collection Time    12/11/12  2:28 PM      Result Value Range Status   Organism ID, Bacteria ENTEROCOCCUS SPECIES   Final   Comment: Susceptibilities performed on previous culture     within the last 5 days.     Gram Stain Report Called to,Read Back By     and Verified With:     DR VAN DAM 12/15/12 1520 BY SMITHERSJ  CULTURE, BLOOD (SINGLE)     Status: None   Collection Time    12/11/12  2:28 PM      Result Value Range Status   Organism ID, Bacteria ENTEROCOCCUS SPECIES   Final   Comment: Susceptibilities performed on previous culture     within the last 5 days.     Gram Stain Report Called  to,Read Back By     and Verified With:     DR VAN DAM 12/15/12 1518 BY SMITHERSJ  CULTURE, BLOOD (ROUTINE X 2)     Status: None   Collection Time    12/13/12  8:30 AM      Result Value Range Status   Specimen Description BLOOD LEFT ARM   Final   Special Requests BOTTLES DRAWN AEROBIC AND ANAEROBIC 10CC   Final   Culture  Setup Time     Final   Value: 12/14/2012 13:27     Performed at [facility]   Culture     Final   Value:        BLOOD CULTURE RECEIVED NO GROWTH TO DATE CULTURE WILL BE HELD FOR 5 DAYS BEFORE ISSUING A FINAL NEGATIVE REPORT     Performed at [facility]   Report Status PENDING   Incomplete  CULTURE, BLOOD (ROUTINE X 2)     Status: None   Collection Time    12/13/12  9:45 AM  Result Value Range Status   Specimen Description BLOOD LEFT ARM   Final   Special Requests BOTTLES DRAWN AEROBIC AND ANAEROBIC 10CC   Final   Culture  Setup Time     Final   Value: 12/14/2012 13:27     Performed at [facility]   Culture     Final   Value:        BLOOD CULTURE RECEIVED NO GROWTH TO DATE CULTURE WILL BE HELD FOR 5 DAYS BEFORE ISSUING A FINAL NEGATIVE REPORT     Performed at [facility]   Report Status PENDING   Incomplete  CULTURE, BLOOD (ROUTINE X 2)     Status: None   Collection Time    12/16/12  6:15 PM      Result Value Range Status   Specimen Description BLOOD LEFT ARM   Final   Special Requests BOTTLES DRAWN AEROBIC ONLY 3CC   Final   Culture  Setup Time     Final   Value: 12/17/2012 01:49     Performed at [facility]   Culture     Final   Value:        BLOOD CULTURE RECEIVED NO GROWTH TO DATE CULTURE WILL BE HELD FOR 5 DAYS BEFORE ISSUING A FINAL NEGATIVE REPORT     Performed at [facility]   Report Status PENDING   Incomplete  CULTURE, BLOOD (ROUTINE X 2)     Status: None   Collection Time    12/16/12  6:25 PM      Result Value Range Status   Specimen Description BLOOD LEFT FOREARM   Final   Special  Requests BOTTLES DRAWN AEROBIC ONLY 3CC   Final   Culture  Setup Time     Final   Value: 12/17/2012 01:49     Performed at [facility]   Culture     Final   Value:        BLOOD CULTURE RECEIVED NO GROWTH TO DATE CULTURE WILL BE HELD FOR 5 DAYS BEFORE ISSUING A FINAL NEGATIVE REPORT     Performed at [facility]   Report Status PENDING   Incomplete  URINE CULTURE     Status: None   Collection Time    12/17/12  3:45 AM      Result Value Range Status   Specimen Description URINE, CLEAN CATCH   Final   Special Requests Normal   Final   Culture  Setup Time     Final   Value: 12/17/2012 09:46     Performed at [facility] Count     Final   Value: NO GROWTH     Performed at [facility]   Culture     Final   Value: NO GROWTH     Performed at [facility]   Report Status 12/18/2012 FINAL   Final    Radiology Reports Ir Fluoro Guide Cv Line Right  12/15/2012   *RADIOLOGY REPORT*  PICC PLACEMENT WITH ULTRASOUND AND FLUOROSCOPIC  GUIDANCE  Clinical History: 72 year old female with endocarditis in need of durable central venous access for outpatient antibiotic therapy.  Fluoroscopy Time: 6 seconds  Procedure:  The right arm was prepped with chlorhexidine, draped in the usual sterile fashion using maximum barrier technique (cap and mask, sterile gown, sterile gloves, large sterile sheet, hand hygiene and cutaneous antiseptic).  Local anesthesia was attained by infiltration with 1% lidocaine.  Ultrasound demonstrated patency  of the right basilic vein, and this was documented with an image.  Under real-time ultrasound guidance, this vein was accessed with a 21 gauge micropuncture needle and image documentation was performed.  The needle was exchanged over a guidewire for a peel-away sheath through which a 41 cm 5 [place] dual lumen power injectable PICC was advanced, and positioned with its tip at the lower SVC/right atrial junction.  Fluoroscopy during  the procedure and fluoro spot radiograph confirms appropriate catheter position.  The catheter was flushed, secured to the skin with Prolene sutures, and covered with a sterile dressing.  Complications:  None.  The patient tolerated the procedure well.  IMPRESSION:  Successful placement of a right basilic vein approach dual lumen power PICC with sonographic and fluoroscopic guidance.  The catheter is ready for use.  Signed,  Sterling Big, MD [facility]   Original Report Authenticated By: Malachy Moan, M.D.   Ir US Guide Vasc Access Right  12/15/2012   *RADIOLOGY REPORT*  PICC PLACEMENT WITH ULTRASOUND AND FLUOROSCOPIC  GUIDANCE  Clinical History: 72 year old female with endocarditis in need of durable central venous access for outpatient antibiotic therapy.  Fluoroscopy Time: 6 seconds  Procedure:  The right arm was prepped with chlorhexidine, draped in the usual sterile fashion using maximum barrier technique (cap and mask, sterile gown, sterile gloves, large sterile sheet, hand hygiene and cutaneous antiseptic).  Local anesthesia was attained by infiltration with 1% lidocaine.  Ultrasound demonstrated patency of the right basilic vein, and this was documented with an image.  Under real-time ultrasound guidance, this vein was accessed with a 21 gauge micropuncture needle and image documentation was performed.  The needle was exchanged over a guidewire for a peel-away sheath through which a 41 cm 5 [place] dual lumen power injectable PICC was advanced, and positioned with its tip at the lower SVC/right atrial junction.  Fluoroscopy during the procedure and fluoro spot radiograph confirms appropriate catheter position.  The catheter was flushed, secured to the skin with Prolene sutures, and covered with a sterile dressing.  Complications:  None.  The patient tolerated the procedure well.  IMPRESSION:  Successful placement of a right basilic vein  approach dual lumen power PICC with sonographic and fluoroscopic guidance.  The catheter is ready for use.  Signed,  Sterling Big, MD [facility]   Original Report Authenticated By: Malachy Moan, M.D.   Dg Abd Acute W/chest  12/16/2012   *RADIOLOGY REPORT*  Clinical Data: Vomiting.  ACUTE ABDOMEN SERIES (ABDOMEN 2 VIEW & CHEST 1 VIEW)  Comparison: Chest x-ray 07/21/2012.  Findings: The heart is enlarged.  Previous CABG.  PICC line from right arm approach lies at the cavoatrial junction.  Mild vascular congestion.  Overall improved congestive failure from priors.  No effusion or pneumothorax.  Flat and decubitus abdomen reveals no free air over the liver.  Gas pattern is nonobstructive.  Moderate vascular calcification is seen.  Degenerative changes lumbar spine with levoconvex scoliosis but no osseous lesions.  IMPRESSION: Moderate vascular congestion without overt failure.  Nonobstructive gas pattern.   Original Report Authenticated By: Davonna Belling, M.D.    CBC  Recent Labs Lab 12/11/12 1428 12/12/12 1357 12/13/12 0418 12/16/12 0845 12/18/12 0525  WBC 7.5 7.3 5.7 8.8 4.7  HGB 10.9* 10.5* 9.7* 8.9* 7.9*  HCT 31.6* 31.2* 28.6* 26.5* 23.9*  PLT 148* 124* 121* 131* 115*  MCV 88.3 89.7 89.7 90.1 91.2  MCH 30.4  30.2 30.4 30.3 30.2  MCHC 34.5 33.7 33.9 33.6 33.1  RDW 14.5 14.0 13.9 14.4 14.4  LYMPHSABS 1.6  --   --  1.2  --   MONOABS 0.7  --   --  0.9  --   EOSABS 0.0  --   --  0.1  --   BASOSABS 0.0  --   --  0.0  --     Chemistries   Recent Labs Lab 12/11/12 1428 12/12/12 1357  12/14/12 1105 12/15/12 0500 12/16/12 0845 12/16/12 1555 12/18/12 0525  NA 134* 132*  < > 134* 134* 132* 134* 139  K 3.7 3.4*  < > 2.9* 4.0 5.4* 4.5 4.5  CL 98 96  < > 99 102 102 103 108  CO2 24 23  < > 28 23 20 19 20   GLUCOSE 98 119*  < > 105* 98 108* 97 78  BUN 27* 31*  < > 19 18 17 15 20   CREATININE 1.21* 1.12*  < > 0.88 0.99 1.12*  1.04 1.23*  CALCIUM 8.9 9.0  < > 8.7 8.6 8.8 8.6 8.0*  AST 13 14  --   --   --  21  --   --   ALT <8 5  --   --   --  6  --   --   ALKPHOS 98 116  --   --   --  112  --   --   BILITOT 0.8 0.5  --   --   --  0.6  --   --   < > = values in this interval not displayed. ------------------------------------------------------------------------------------------------------------------ estimated creatinine clearance is 39 ml/min (by C-G formula based on Cr of 1.23). ------------------------------------------------------------------------------------------------------------------ No results found for this basename: HGBA1C,  in the last 72 hours ------------------------------------------------------------------------------------------------------------------ No results found for this basename: CHOL, HDL, LDLCALC, TRIG, CHOLHDL, LDLDIRECT,  in the last 72 hours ------------------------------------------------------------------------------------------------------------------ No results found for this basename: TSH, T4TOTAL, FREET3, T3FREE, THYROIDAB,  in the last 72 hours ------------------------------------------------------------------------------------------------------------------ No results found for this basename: VITAMINB12, FOLATE, FERRITIN, TIBC, IRON, RETICCTPCT,  in the last 72 hours  Coagulation profile  Recent Labs Lab 12/14/12 0531 12/15/12 0500 12/16/12 0845 12/17/12 0500 12/18/12 0525  INR 1.94* 1.82* 1.68* 2.10* 2.91*    No results found for this basename: DDIMER,  in the last 72 hours  Cardiac Enzymes No results found for this basename: CK, CKMB, TROPONINI, MYOGLOBIN,  in the last 168 hours ------------------------------------------------------------------------------------------------------------------ No components found with this basename: POCBNP,

## 2012-12-18 NOTE — Progress Notes (Signed)
INFECTIOUS DISEASE PROGRESS NOTE  ID: [name] is a 72 y.o. female with  Principal Problem:   Anemia associated with acute blood loss Active Problems:   Atrial fibrillation   Endocarditis   Bacteremia   Nausea with vomiting   Lupus   Severe malnutrition  Subjective: No n/v or diarrhea. She ambulated to bathroom today.   Abtx:  Anti-infectives   Start     Dose/Rate Route Frequency Ordered Stop   12/17/12 1800  gentamicin (GARAMYCIN) IVPB 60 mg     60 mg 100 mL/hr over 30 Minutes Intravenous Every 12 hours 12/17/12 0834     12/16/12 1800  hydroxychloroquine (PLAQUENIL) tablet 200 mg     200 mg Oral 2 times daily 12/16/12 1456     12/16/12 1700  gentamicin (GARAMYCIN) IVPB 60 mg  Status:  Discontinued     60 mg 100 mL/hr over 30 Minutes Intravenous Every 12 hours 12/16/12 1318 12/17/12 0834   12/16/12 1700  ampicillin (OMNIPEN) 2 g in sodium chloride 0.9 % 50 mL IVPB     2 g 150 mL/hr over 20 Minutes Intravenous Every 4 hours 12/16/12 1502     12/16/12 1430  ampicillin (OMNIPEN) 2 g in sodium chloride 0.9 % 50 mL IVPB  Status:  Discontinued     2 g 150 mL/hr over 20 Minutes Intravenous 6 times per day 12/16/12 1318 12/16/12 1503      Medications:  Scheduled: . ampicillin (OMNIPEN) IV  2 g Intravenous Q4H  . atorvastatin  20 mg Oral q1800  . digoxin  0.125 mg Oral q morning - 10a  . feeding supplement  1 Container Oral BID BM  . furosemide  20 mg Oral Daily  . gentamicin  60 mg Intravenous Q12H  . hydroxychloroquine  200 mg Oral BID  . metoprolol  25 mg Oral BID  . ondansetron (ZOFRAN) IV  4 mg Intravenous Once  . pantoprazole (PROTONIX) IV  40 mg Intravenous Q12H  . predniSONE  5 mg Oral QAC breakfast  . rOPINIRole  1 mg Oral QHS  . sodium chloride  10-40 mL Intracatheter Q12H  . sulfaSALAzine  1,000 mg Oral BID  . warfarin  1 mg Oral ONCE-1800  . Warfarin - Pharmacist Dosing Inpatient   Does not apply q1800  . zolpidem  5 mg Oral Once     Objective: Vital signs in last 24 hours: Temp:  [97.3 F (36.3 C)-98 F (36.7 C)] 97.9 F (36.6 C) (09/25 0503) Pulse Rate:  [65-72] 68 (09/25 1039) Resp:  [16-20] 18 (09/25 0503) BP: (102-130)/(59-73) 102/61 mmHg (09/25 1038) SpO2:  [94 %-96 %] 94 % (09/25 0503) Weight:  [69.8 kg (153 lb 14.1 oz)] 69.8 kg (153 lb 14.1 oz) (09/25 0336)  General appearance: alert, cooperative, no distress and pale Resp: clear to auscultation bilaterally Cardio: regular rate and rhythm GI: normal findings: bowel sounds normal and soft, non-tender  Lab Results  Recent Labs  12/16/12 0845 12/16/12 1555 12/18/12 0525  WBC 8.8  --  4.7  HGB 8.9*  --  7.9*  HCT 26.5*  --  23.9*  NA 132* 134* 139  K 5.4* 4.5 4.5  CL 102 103 108  CO2 20 19 20   BUN 17 15 20   CREATININE 1.12* 1.04 1.23*   Liver Panel  Recent Labs  12/16/12 0845  PROT 6.9  ALBUMIN 2.5*  AST 21  ALT 6  ALKPHOS 112  BILITOT 0.6   Sedimentation Rate No results found  for this basename: ESRSEDRATE,  in the last 72 hours C-Reactive Protein No results found for this basename: CRP,  in the last 72 hours  Microbiology: Recent Results (from the past 240 hour(s))  CULTURE, BLOOD (SINGLE)     Status: None   Collection Time    12/09/12 11:40 AM      Result Value Range Status   Organism ID, Bacteria ENTEROCOCCUS SPECIES   Final   Comment: Gram Stain Report Called to,Read Back By     and Verified With:     DR VAN DAM 1115 12/12/12 BY KRAWS     Susceptibilities performed on previous culture     within the last 5 days.  CULTURE, BLOOD (SINGLE)     Status: None   Collection Time    12/09/12 11:40 AM      Result Value Range Status   Culture ENTEROCOCCUS SPECIES   Final   Organism ID, Bacteria ENTEROCOCCUS SPECIES   Final   Comment: Corrected results called to:     DR VAN DAM 1115 12/12/12 BY KRAWS     Previously reported as Gram Negative Coccobacilli     Combination Therapy of high-dose Ampicillin or     Vancomycin,plus  an aminoglycoside,is usually     indicated for serious Enterococcal infections.     Gram Stain Report Called to,Read Back By     and Verified With:     DR VAN DAM 12/11/12 1015 BY SMITHERSJ  CULTURE, BLOOD (SINGLE)     Status: None   Collection Time    12/11/12  2:28 PM      Result Value Range Status   Organism ID, Bacteria ENTEROCOCCUS SPECIES   Final   Comment: Susceptibilities performed on previous culture     within the last 5 days.     Gram Stain Report Called to,Read Back By     and Verified With:     DR VAN DAM 12/15/12 1520 BY SMITHERSJ  CULTURE, BLOOD (SINGLE)     Status: None   Collection Time    12/11/12  2:28 PM      Result Value Range Status   Organism ID, Bacteria ENTEROCOCCUS SPECIES   Final   Comment: Susceptibilities performed on previous culture     within the last 5 days.     Gram Stain Report Called to,Read Back By     and Verified With:     DR VAN DAM 12/15/12 1518 BY SMITHERSJ  CULTURE, BLOOD (ROUTINE X 2)     Status: None   Collection Time    12/13/12  8:30 AM      Result Value Range Status   Specimen Description BLOOD LEFT ARM   Final   Special Requests BOTTLES DRAWN AEROBIC AND ANAEROBIC 10CC   Final   Culture  Setup Time     Final   Value: 12/14/2012 13:27     Performed at [facility]   Culture     Final   Value:        BLOOD CULTURE RECEIVED NO GROWTH TO DATE CULTURE WILL BE HELD FOR 5 DAYS BEFORE ISSUING A FINAL NEGATIVE REPORT     Performed at [facility]   Report Status PENDING   Incomplete  CULTURE, BLOOD (ROUTINE X 2)     Status: None   Collection Time    12/13/12  9:45 AM      Result Value Range Status   Specimen Description BLOOD LEFT ARM   Final  Special Requests BOTTLES DRAWN AEROBIC AND ANAEROBIC 10CC   Final   Culture  Setup Time     Final   Value: 12/14/2012 13:27     Performed at [facility]   Culture     Final   Value:        BLOOD CULTURE RECEIVED NO GROWTH TO DATE CULTURE WILL BE HELD FOR 5 DAYS BEFORE  ISSUING A FINAL NEGATIVE REPORT     Performed at [facility]   Report Status PENDING   Incomplete  CULTURE, BLOOD (ROUTINE X 2)     Status: None   Collection Time    12/16/12  6:15 PM      Result Value Range Status   Specimen Description BLOOD LEFT ARM   Final   Special Requests BOTTLES DRAWN AEROBIC ONLY 3CC   Final   Culture  Setup Time     Final   Value: 12/17/2012 01:49     Performed at [facility]   Culture     Final   Value:        BLOOD CULTURE RECEIVED NO GROWTH TO DATE CULTURE WILL BE HELD FOR 5 DAYS BEFORE ISSUING A FINAL NEGATIVE REPORT     Performed at [facility]   Report Status PENDING   Incomplete  CULTURE, BLOOD (ROUTINE X 2)     Status: None   Collection Time    12/16/12  6:25 PM      Result Value Range Status   Specimen Description BLOOD LEFT FOREARM   Final   Special Requests BOTTLES DRAWN AEROBIC ONLY 3CC   Final   Culture  Setup Time     Final   Value: 12/17/2012 01:49     Performed at [facility]   Culture     Final   Value:        BLOOD CULTURE RECEIVED NO GROWTH TO DATE CULTURE WILL BE HELD FOR 5 DAYS BEFORE ISSUING A FINAL NEGATIVE REPORT     Performed at [facility]   Report Status PENDING   Incomplete  URINE CULTURE     Status: None   Collection Time    12/17/12  3:45 AM      Result Value Range Status   Specimen Description URINE, CLEAN CATCH   Final   Special Requests Normal   Final   Culture  Setup Time     Final   Value: 12/17/2012 09:46     Performed at [facility] Count     Final   Value: NO GROWTH     Performed at [facility]   Culture     Final   Value: NO GROWTH     Performed at [facility]   Report Status 12/18/2012 FINAL   Final    Studies/Results: No results found.   Assessment/Plan: Enterococcal Ao prosthetic endocarditis  Dehydration, n/v, diarrhea  Pyuria Anemia  Total days of antibiotics: 7 amp/gent  UCx and BCx are ngtd.  No change in  her anbx My appreciation to pharmacy to helping with her gent dosing.  She is becoming more anemic, will defer to primary team to w/u         Johny Sax Infectious Diseases (pager) [phone] [email or web address] 12/18/2012, 11:55 AM  LOS: 2 days

## 2012-12-18 NOTE — Progress Notes (Signed)
Patient's family had some questions/concerns regarding patient's care and medications. Charge nurse notified. Charge nurse spoke with patient to address any concerns the family had. Patient's family wanted nurse to know that patient does not want ambien for sleep and will only take trazodone or benadryl. On-call doctor was paged and notified and orders given for benadryl for sleep. Oncoming nurse aware. Will continue to monitor to end of shift. Nurse signing off at this time.

## 2012-12-19 LAB — CBC
HCT: 26.2 % — ABNORMAL LOW (ref 36.0–46.0)
MCH: 30.4 pg (ref 26.0–34.0)
MCHC: 33.6 g/dL (ref 30.0–36.0)
MCV: 90.7 fL (ref 78.0–100.0)
RBC: 2.89 MIL/uL — ABNORMAL LOW (ref 3.87–5.11)
RDW: 14.2 % (ref 11.5–15.5)

## 2012-12-19 LAB — BASIC METABOLIC PANEL
BUN: 17 mg/dL (ref 6–23)
CO2: 20 mEq/L (ref 19–32)
Calcium: 8 mg/dL — ABNORMAL LOW (ref 8.4–10.5)
Chloride: 104 mEq/L (ref 96–112)
Creatinine, Ser: 1.08 mg/dL (ref 0.50–1.10)
GFR calc non Af Amer: 49 mL/min — ABNORMAL LOW (ref >90)
Glucose, Bld: 115 mg/dL — ABNORMAL HIGH (ref 70–99)
Sodium: 136 mEq/L (ref 135–145)

## 2012-12-19 LAB — PROTIME-INR
INR: 3.42 — ABNORMAL HIGH (ref 0.00–1.49)
Prothrombin Time: 33.2 seconds — ABNORMAL HIGH (ref 11.6–15.2)

## 2012-12-19 MED ORDER — TRAZODONE HCL 50 MG PO TABS
50.0000 mg | ORAL_TABLET | Freq: Every evening | ORAL | Status: DC | PRN
  Administered 2012-12-19: 22:00:00 50 mg via ORAL
  Filled 2012-12-19: qty 1

## 2012-12-19 NOTE — Progress Notes (Addendum)
[facility]                                                                                Patient Demographics  [name], is a 72 y.o. female, DOB - 13-Jun-1940, YQM:[id number]  Admit date - 12/16/2012   Admitting Physician Kathlen Mody, MD  Outpatient Primary MD for the patient is [name], MD  LOS - 3   Chief Complaint  Patient presents with  . Emesis  . Weakness        Assessment & Plan  1. Anemia, possibly of chronic disease vs. GI losses  Fecal occult was reordered as patient finally had a bowel movement.  She continues to deny any melanotic stools. Gastroenterology was consulted and felt this anemia to be related to chronic disease rather than GI losses.  Still pending H/H.  Patient is hemodynamically stable.   2.Nausea/ vomiting Diarrhea:   Improved and resolved.  Still pending C. difficile PCR and stool cultures.   May be secondary to her antibiotics due to recent hospitalization.   Although we'll continue her antibiotics. Due to endocarditis. Infectious disease following.   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 He was seen by infectious disease he was put on ampicillin and gentamicin. Antibiotics should be continued for a total of 6 weeks of therapy. Patient did not have TEE or evaluation by cardiology because she is deemed she is not a surgical candidate. This is her third episode of endocarditis.   Patient has PICC line in place  And will continue antibx therapy.   4. 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   5. Atrial fibrillation: Patient is on chronic Coumadin therapy, her rate is controlled with digoxin.   6. Severe protein calorie malnutrition: Secondary to her chronic illness.   7. Enterococcus bacteremia: With resultant endocarditis, susceptibility showed ampicillin sensitive. Patient is getting treatment  with ampicillin and gentamicin.   8. Pulmonary congestion; stop fluids and resume lasix at a low dose.   9.Pyuria  -Urine culture still pending.  DVT prophylaxis- on coumadin with pharmacy to follow.   Principal Problem:   Anemia associated with acute blood loss Active Problems:   Atrial fibrillation   Endocarditis   Bacteremia   Nausea with vomiting   Lupus   Severe malnutrition   Code Status: Full   Family Communication: Spoke with daughter via phone [phone].      Disposition Plan: Admitted.     Procedures None   Consults  Infectious Disease, Gastroenterology   DVT Prophylaxis Warfarin   Lab Results  Component Value Date   PLT 115* 12/18/2012    Medications  Scheduled Meds: . ampicillin (OMNIPEN) IV  2 g Intravenous Q4H  . atorvastatin  20 mg Oral q1800  . digoxin  0.125 mg Oral q morning - 10a  . feeding supplement  1 Container Oral BID BM  . furosemide  20 mg Oral Daily  . gentamicin  80 mg Intravenous Q24H  . hydroxychloroquine  200 mg Oral BID  . metoprolol  25 mg Oral BID  . ondansetron (ZOFRAN) IV  4 mg  Intravenous Once  . pantoprazole  40 mg Oral Q0600  . predniSONE  5 mg Oral QAC breakfast  . rOPINIRole  1 mg Oral QHS  . sodium chloride  10-40 mL Intracatheter Q12H  . sulfaSALAzine  1,000 mg Oral BID  . Warfarin - Pharmacist Dosing Inpatient   Does not apply q1800  . zolpidem  5 mg Oral Once   Continuous Infusions: . sodium chloride 10 mL/hr at 12/16/12 2312   PRN Meds:.promethazine, sodium chloride  Antibiotics    Anti-infectives   Start     Dose/Rate Route Frequency Ordered Stop   12/19/12 0647  gentamicin (GARAMYCIN) IVPB 80 mg     80 mg 100 mL/hr over 30 Minutes Intravenous Every 24 hours 12/18/12 1716     12/17/12 1800  gentamicin (GARAMYCIN) IVPB 60 mg  Status:  Discontinued     60 mg 100 mL/hr over 30 Minutes Intravenous Every 12 hours 12/17/12 0834 12/18/12 1716   12/16/12 1800  hydroxychloroquine (PLAQUENIL) tablet 200 mg      200 mg Oral 2 times daily 12/16/12 1456     12/16/12 1700  gentamicin (GARAMYCIN) IVPB 60 mg  Status:  Discontinued     60 mg 100 mL/hr over 30 Minutes Intravenous Every 12 hours 12/16/12 1318 12/17/12 0834   12/16/12 1700  ampicillin (OMNIPEN) 2 g in sodium chloride 0.9 % 50 mL IVPB     2 g 150 mL/hr over 20 Minutes Intravenous Every 4 hours 12/16/12 1502     12/16/12 1430  ampicillin (OMNIPEN) 2 g in sodium chloride 0.9 % 50 mL IVPB  Status:  Discontinued     2 g 150 mL/hr over 20 Minutes Intravenous 6 times per day 12/16/12 1318 12/16/12 1503       Time Spent in minutes   25 minutes   Caillou Minus D.O. on 12/19/2012 at 12:24 PM  Between 7am to 7pm - Pager - [phone]  After 7pm go to [email or web address] - password [email or web address]  And look for the night coverage person covering for me after hours  [facility] Office  [phone]    Subjective:   [name] today has, No headache, No chest pain, No abdominal pain - No Nausea, No new weakness tingling or numbness, No Cough.  Some SOB when lying down.  States dizziness and nausea have subsided.  She denies blood per rectum or with stool, although states she has not had a bowel movement.    Objective:   Filed Vitals:   12/18/12 1405 12/18/12 2017 12/19/12 0402 12/19/12 1033  BP: 130/69 117/68 133/84 121/77  Pulse: 68 82 105 80  Temp: 97.9 F (36.6 C) 97.8 F (36.6 C) 97.9 F (36.6 C) 98.1 F (36.7 C)  TempSrc: Oral Oral Oral Oral  Resp: 18 18 18 23   Height:      Weight:   69.2 kg (152 lb 8.9 oz)   SpO2: 96% 98% 98% 96%    Wt Readings from Last 3 Encounters:  12/19/12 69.2 kg (152 lb 8.9 oz)  12/12/12 69.264 kg (152 lb 11.2 oz)  12/11/12 69.202 kg (152 lb 9 oz)     Intake/Output Summary (Last 24 hours) at 12/19/12 1224 Last data filed at 12/19/12 0910  Gross per 24 hour  Intake    630 ml  Output    652 ml  Net    -22 ml    Exam General: Awake Alert, Oriented X 3, No new deficits, Normal affect   HEENT: [place]/AT,PERRAL  Neck: Supple Neck,No JVD, No cervical lymphadenopathy appriciated.  Respiratory: Symmetrical Chest wall movement, Good air movement bilaterally, CTAB  Cardiovascular: RRR. No Gallops,Rubs or new Murmurs  Abdomen: Soft, nontender, nondistended, positive bowel sounds.  Extremities:No Cyanosis, Clubbing, edema Neuro: Grossly intact, nonfocal.    Data Review   Micro Results Recent Results (from the past 240 hour(s))  CULTURE, BLOOD (SINGLE)     Status: None   Collection Time    12/11/12  2:28 PM      Result Value Range Status   Organism ID, Bacteria ENTEROCOCCUS SPECIES   Final   Comment: Susceptibilities performed on previous culture     within the last 5 days.     Gram Stain Report Called to,Read Back By     and Verified With:     DR VAN DAM 12/15/12 1520 BY [email or web address]  CULTURE, BLOOD (SINGLE)     Status: None   Collection Time    12/11/12  2:28 PM      Result Value Range Status   Organism ID, Bacteria ENTEROCOCCUS SPECIES   Final   Comment: Susceptibilities performed on previous culture     within the last 5 days.     Gram Stain Report Called to,Read Back By     and Verified With:     DR VAN DAM 12/15/12 1518 BY [email or web address]  CULTURE, BLOOD (ROUTINE X 2)     Status: None   Collection Time    12/13/12  8:30 AM      Result Value Range Status   Specimen Description BLOOD LEFT ARM   Final   Special Requests BOTTLES DRAWN AEROBIC AND ANAEROBIC 10CC   Final   Culture  Setup Time     Final   Value: 12/14/2012 13:27     Performed at [facility]   Culture     Final   Value:        BLOOD CULTURE RECEIVED NO GROWTH TO DATE CULTURE WILL BE HELD FOR 5 DAYS BEFORE ISSUING A FINAL NEGATIVE REPORT     Performed at [facility]   Report Status PENDING   Incomplete  CULTURE, BLOOD (ROUTINE X 2)     Status: None   Collection Time    12/13/12  9:45 AM      Result Value Range Status   Specimen Description BLOOD LEFT ARM   Final   Special Requests  BOTTLES DRAWN AEROBIC AND ANAEROBIC 10CC   Final   Culture  Setup Time     Final   Value: 12/14/2012 13:27     Performed at [facility]   Culture     Final   Value:        BLOOD CULTURE RECEIVED NO GROWTH TO DATE CULTURE WILL BE HELD FOR 5 DAYS BEFORE ISSUING A FINAL NEGATIVE REPORT     Performed at [facility]   Report Status PENDING   Incomplete  CULTURE, BLOOD (ROUTINE X 2)     Status: None   Collection Time    12/16/12  6:15 PM      Result Value Range Status   Specimen Description BLOOD LEFT ARM   Final   Special Requests BOTTLES DRAWN AEROBIC ONLY 3CC   Final   Culture  Setup Time     Final   Value: 12/17/2012 01:49     Performed at [facility]   Culture     Final   Value:  BLOOD CULTURE RECEIVED NO GROWTH TO DATE CULTURE WILL BE HELD FOR 5 DAYS BEFORE ISSUING A FINAL NEGATIVE REPORT     Performed at [facility]   Report Status PENDING   Incomplete  CULTURE, BLOOD (ROUTINE X 2)     Status: None   Collection Time    12/16/12  6:25 PM      Result Value Range Status   Specimen Description BLOOD LEFT FOREARM   Final   Special Requests BOTTLES DRAWN AEROBIC ONLY 3CC   Final   Culture  Setup Time     Final   Value: 12/17/2012 01:49     Performed at [facility]   Culture     Final   Value:        BLOOD CULTURE RECEIVED NO GROWTH TO DATE CULTURE WILL BE HELD FOR 5 DAYS BEFORE ISSUING A FINAL NEGATIVE REPORT     Performed at [facility]   Report Status PENDING   Incomplete  URINE CULTURE     Status: None   Collection Time    12/17/12  3:45 AM      Result Value Range Status   Specimen Description URINE, CLEAN CATCH   Final   Special Requests Normal   Final   Culture  Setup Time     Final   Value: 12/17/2012 09:46     Performed at [facility] Count     Final   Value: NO GROWTH     Performed at [facility]   Culture     Final   Value: NO GROWTH     Performed at [facility]    Report Status 12/18/2012 FINAL   Final    Radiology Reports Ir Fluoro Guide Cv Line Right  12/15/2012   *RADIOLOGY REPORT*  PICC PLACEMENT WITH ULTRASOUND AND FLUOROSCOPIC  GUIDANCE  Clinical History: 72 year old female with endocarditis in need of durable central venous access for outpatient antibiotic therapy.  Fluoroscopy Time: 6 seconds  Procedure:  The right arm was prepped with chlorhexidine, draped in the usual sterile fashion using maximum barrier technique (cap and mask, sterile gown, sterile gloves, large sterile sheet, hand hygiene and cutaneous antiseptic).  Local anesthesia was attained by infiltration with 1% lidocaine.  Ultrasound demonstrated patency of the right basilic vein, and this was documented with an image.  Under real-time ultrasound guidance, this vein was accessed with a 21 gauge micropuncture needle and image documentation was performed.  The needle was exchanged over a guidewire for a peel-away sheath through which a 41 cm 5 [place] dual lumen power injectable PICC was advanced, and positioned with its tip at the lower SVC/right atrial junction.  Fluoroscopy during the procedure and fluoro spot radiograph confirms appropriate catheter position.  The catheter was flushed, secured to the skin with Prolene sutures, and covered with a sterile dressing.  Complications:  None.  The patient tolerated the procedure well.  IMPRESSION:  Successful placement of a right basilic vein approach dual lumen power PICC with sonographic and fluoroscopic guidance.  The catheter is ready for use.  Signed,  Sterling Big, MD [facility]   Original Report Authenticated By: Malachy Moan, M.D.   Ir US Guide Vasc Access Right  12/15/2012   *RADIOLOGY REPORT*  PICC PLACEMENT WITH ULTRASOUND AND FLUOROSCOPIC  GUIDANCE  Clinical History: 72 year old female with endocarditis in need of durable central venous access for outpatient antibiotic  therapy.  Fluoroscopy Time: 6 seconds  Procedure:  The right arm was prepped with chlorhexidine, draped in the usual sterile fashion using maximum barrier technique (cap and mask, sterile gown, sterile gloves, large sterile sheet, hand hygiene and cutaneous antiseptic).  Local anesthesia was attained by infiltration with 1% lidocaine.  Ultrasound demonstrated patency of the right basilic vein, and this was documented with an image.  Under real-time ultrasound guidance, this vein was accessed with a 21 gauge micropuncture needle and image documentation was performed.  The needle was exchanged over a guidewire for a peel-away sheath through which a 41 cm 5 [place] dual lumen power injectable PICC was advanced, and positioned with its tip at the lower SVC/right atrial junction.  Fluoroscopy during the procedure and fluoro spot radiograph confirms appropriate catheter position.  The catheter was flushed, secured to the skin with Prolene sutures, and covered with a sterile dressing.  Complications:  None.  The patient tolerated the procedure well.  IMPRESSION:  Successful placement of a right basilic vein approach dual lumen power PICC with sonographic and fluoroscopic guidance.  The catheter is ready for use.  Signed,  Sterling Big, MD [facility]   Original Report Authenticated By: Malachy Moan, M.D.   Dg Abd Acute W/chest  12/16/2012   *RADIOLOGY REPORT*  Clinical Data: Vomiting.  ACUTE ABDOMEN SERIES (ABDOMEN 2 VIEW & CHEST 1 VIEW)  Comparison: Chest x-ray 07/21/2012.  Findings: The heart is enlarged.  Previous CABG.  PICC line from right arm approach lies at the cavoatrial junction.  Mild vascular congestion.  Overall improved congestive failure from priors.  No effusion or pneumothorax.  Flat and decubitus abdomen reveals no free air over the liver.  Gas pattern is nonobstructive.  Moderate vascular calcification is seen.  Degenerative changes  lumbar spine with levoconvex scoliosis but no osseous lesions.  IMPRESSION: Moderate vascular congestion without overt failure.  Nonobstructive gas pattern.   Original Report Authenticated By: Davonna Belling, M.D.    CBC  Recent Labs Lab 12/12/12 1357 12/13/12 0418 12/16/12 0845 12/18/12 0525  WBC 7.3 5.7 8.8 4.7  HGB 10.5* 9.7* 8.9* 7.9*  HCT 31.2* 28.6* 26.5* 23.9*  PLT 124* 121* 131* 115*  MCV 89.7 89.7 90.1 91.2  MCH 30.2 30.4 30.3 30.2  MCHC 33.7 33.9 33.6 33.1  RDW 14.0 13.9 14.4 14.4  LYMPHSABS  --   --  1.2  --   MONOABS  --   --  0.9  --   EOSABS  --   --  0.1  --   BASOSABS  --   --  0.0  --     Chemistries   Recent Labs Lab 12/12/12 1357  12/14/12 1105 12/15/12 0500 12/16/12 0845 12/16/12 1555 12/18/12 0525  NA 132*  < > 134* 134* 132* 134* 139  K 3.4*  < > 2.9* 4.0 5.4* 4.5 4.5  CL 96  < > 99 102 102 103 108  CO2 23  < > 28 23 20 19 20   GLUCOSE 119*  < > 105* 98 108* 97 78  BUN 31*  < > 19 18 17 15 20   CREATININE 1.12*  < > 0.88 0.99 1.12* 1.04 1.23*  CALCIUM 9.0  < > 8.7 8.6 8.8 8.6 8.0*  AST 14  --   --   --  21  --   --   ALT 5  --   --   --  6  --   --  ALKPHOS 116  --   --   --  112  --   --   BILITOT 0.5  --   --   --  0.6  --   --   < > = values in this interval not displayed. ------------------------------------------------------------------------------------------------------------------ estimated creatinine clearance is 39 ml/min (by C-G formula based on Cr of 1.23). ------------------------------------------------------------------------------------------------------------------ No results found for this basename: HGBA1C,  in the last 72 hours ------------------------------------------------------------------------------------------------------------------ No results found for this basename: CHOL, HDL, LDLCALC, TRIG, CHOLHDL, LDLDIRECT,  in the last 72  hours ------------------------------------------------------------------------------------------------------------------ No results found for this basename: TSH, T4TOTAL, FREET3, T3FREE, THYROIDAB,  in the last 72 hours ------------------------------------------------------------------------------------------------------------------ No results found for this basename: VITAMINB12, FOLATE, FERRITIN, TIBC, IRON, RETICCTPCT,  in the last 72 hours  Coagulation profile  Recent Labs Lab 12/15/12 0500 12/16/12 0845 12/17/12 0500 12/18/12 0525 12/19/12 0455  INR 1.82* 1.68* 2.10* 2.91* 3.42*    No results found for this basename: DDIMER,  in the last 72 hours  Cardiac Enzymes No results found for this basename: CK, CKMB, TROPONINI, MYOGLOBIN,  in the last 168 hours ------------------------------------------------------------------------------------------------------------------ No components found with this basename: POCBNP,

## 2012-12-19 NOTE — Progress Notes (Signed)
This morning nurse tech notified nurse that patient refused bath and that patient stated she wanted her bath later in the day. Later this evening patient also refused to eat her dinner for she stated she didn't like it and was not able to chew the meat. Asked patient if she would like for [place] to order something else for her to eat and patient stated a firm no. Later the nurse tech gave patient some milk per request and patient had a banana from earlier this morning and decided to eat that. Also the student nurse went in to assess the patient for her check off and student nurse notified nurse that she was ready to take her bath this evening. Nurse stated that was okay and to make sure to get towels, washcloths, new gown and to ask if sheets wanted to be changed. Student nurse understood. Will continue to monitor to end of shift.

## 2012-12-19 NOTE — Progress Notes (Signed)
12/19/12 Pt.A/Ox4 and is ambulatory to the [facility] with 1 person standby assist. She had no c/o pain and no signs of distress. PRN benadryl was given at night to help patient sleep.

## 2012-12-19 NOTE — Progress Notes (Signed)
ANTICOAGULATION CONSULT NOTE - Follow Up Consult  Pharmacy Consult for warfarin Indication: atrial fibrillation  Allergies  Allergen Reactions  . Ceftriaxone     RASH BUT TOLERATED AMPICILLIN AND IMIPENEM WITHOUT PROBLEMS  . Codeine Nausea Only    Patient Measurements: Height: 5\' 7"  (170.2 cm) Weight: 152 lb 8.9 oz (69.2 kg) IBW/kg (Calculated) : 61.6   Vital Signs: Temp: 97.9 F (36.6 C) (09/26 0402) Temp src: Oral (09/26 0402) BP: 133/84 mmHg (09/26 0402) Pulse Rate: 105 (09/26 0402)  Labs:  Recent Labs  12/16/12 0845 12/16/12 1555 12/17/12 0500 12/18/12 0525 12/19/12 0455  HGB 8.9*  --   --  7.9*  --   HCT 26.5*  --   --  23.9*  --   PLT 131*  --   --  115*  --   APTT 36  --   --   --   --   LABPROT 19.3*  --  22.9* 29.4* 33.2*  INR 1.68*  --  2.10* 2.91* 3.42*  CREATININE 1.12* 1.04  --  1.23*  --     Estimated Creatinine Clearance: 39 ml/min (by C-G formula based on Cr of 1.23).   Medications:  Scheduled:  . ampicillin (OMNIPEN) IV  2 g Intravenous Q4H  . atorvastatin  20 mg Oral q1800  . digoxin  0.125 mg Oral q morning - 10a  . feeding supplement  1 Container Oral BID BM  . furosemide  20 mg Oral Daily  . gentamicin  80 mg Intravenous Q24H  . hydroxychloroquine  200 mg Oral BID  . metoprolol  25 mg Oral BID  . ondansetron (ZOFRAN) IV  4 mg Intravenous Once  . pantoprazole  40 mg Oral Q0600  . predniSONE  5 mg Oral QAC breakfast  . rOPINIRole  1 mg Oral QHS  . sodium chloride  10-40 mL Intracatheter Q12H  . sulfaSALAzine  1,000 mg Oral BID  . Warfarin - Pharmacist Dosing Inpatient   Does not apply q1800  . zolpidem  5 mg Oral Once    Assessment: 25 YOF who was discharged on 9/22 on warfarin for AFib. Her home dose was to be 3mg  daily- INR on admit 1.68. INR has increased dramatically- today it is up to 3.42 after receiving two doses of warfarin 4mg  and one dose of warfarin 1mg . Her Hgb has been trending down- GI saw and observed no frank blood  per rectum or reason for anemia to be from GI source. Was on iron PTA, but as her ferritin was elevated recently, do not believe non-adequate iron stores are the driving force behind drop in Hgb. Platelets have also decreased since admission. No overt bleeding noted.  Goal of Therapy:  INR 2-3 Monitor platelets by anticoagulation protocol: Yes   Plan:  1. No warfarin tonight 2. Daily PT/INR 3. Follow up for any s/s overt bleeding  [name] D. [name], PharmD Clinical Pharmacist Pager: [phone] 12/19/2012 8:20 AM

## 2012-12-19 NOTE — Evaluation (Signed)
Physical Therapy Evaluation Patient Details Name: [name] MRN: [id number] DOB: 1940-11-06 Today's Date: 12/19/2012 Time: 1325-1340 PT Time Calculation (min): 15 min  PT Assessment / Plan / Recommendation History of Present Illness  72 y.o. female  with a history of 2 previous episodes of endocarditis, prosthetic mitral valve, Lupus, Pulmonary fibrosis, Afib, NSTEMI, HLD and CAD discharged yesterday came in today with nausea, VOMITING and one episode of diarrhea. On arrival to ED, pt was on 2 lit [place] oxygen, slightly dehydrated, but  comfortable, did not have any complaints. Her labs revealed mild renal insufficiency and hyperkalemia. She is referred to medical service for observation.  Clinical Impression  Pt is at supervision level for gait and Mod I for transfers.  Pt has supervision at home for d/c and does not have any further PT needs at this time.    PT Assessment  Patent does not need any further PT services    Follow Up Recommendations  No PT follow up    Does the patient have the potential to tolerate intense rehabilitation      Barriers to Discharge        Equipment Recommendations       Recommendations for Other Services     Frequency      Precautions / Restrictions Restrictions Weight Bearing Restrictions: No   Pertinent Vitals/Pain No c/o pain.      Mobility  Bed Mobility Supine to Sit: 6: Modified independent (Device/Increase time) Sit to Supine: 6: Modified independent (Device/Increase time) Transfers Sit to Stand: 6: Modified independent (Device/Increase time) Stand to Sit: 6: Modified independent (Device/Increase time) Ambulation/Gait Ambulation/Gait Assistance: 5: Supervision Ambulation Distance (Feet): 300 Feet Assistive device: None Ambulation/Gait Assistance Details: supervision due to some unsteadiness, pt able to correct LOB without assistance.  Pt reports she feels "weak" but does not feel dizzy or have pain with gait Stairs:  No Wheelchair Mobility Wheelchair Mobility: No    Exercises     PT Diagnosis:    PT Problem List:   PT Treatment Interventions:       PT Goals(Current goals can be found in the care plan section)    Visit Information  Last PT Received On: 12/19/12 Assistance Needed: +1 History of Present Illness: 72 y.o. female  with a history of 2 previous episodes of endocarditis, prosthetic mitral valve, Lupus, Pulmonary fibrosis, Afib, NSTEMI, HLD and CAD discharged yesterday came in today with nausea, VOMITING and one episode of diarrhea. On arrival to ED, pt was on 2 lit [place] oxygen, slightly dehydrated, but  comfortable, did not have any complaints. Her labs revealed mild renal insufficiency and hyperkalemia. She is referred to medical service for observation.       Prior Functioning  Home Living Family/patient expects to be discharged to:: Private residence Living Arrangements: Alone Available Help at Discharge: Family Type of Home: Apartment Home Access: Level entry Home Layout: One level Additional Comments: family to assist at d/c Prior Function Level of Independence: Independent Communication Communication: No difficulties    Cognition  Cognition Arousal/Alertness: Awake/alert Behavior During Therapy: WFL for tasks assessed/performed Overall Cognitive Status: Within Functional Limits for tasks assessed    Extremity/Trunk Assessment Lower Extremity Assessment Lower Extremity Assessment: Overall WFL for tasks assessed Cervical / Trunk Assessment Cervical / Trunk Assessment: Normal   Balance Static Standing Balance Static Standing - Level of Assistance: 6: Modified independent (Device/Increase time)  End of Session PT - End of Session Equipment Utilized During Treatment: Gait belt Activity Tolerance: Patient tolerated  treatment well Patient left: in bed;with call bell/phone within reach;with family/visitor present Nurse Communication: Mobility status  GP      Kathrina Crosley 12/19/2012, 2:12 PM

## 2012-12-20 LAB — CULTURE, BLOOD (ROUTINE X 2)
Culture: NO GROWTH
Culture: NO GROWTH

## 2012-12-20 LAB — PROTIME-INR: Prothrombin Time: 25.8 seconds — ABNORMAL HIGH (ref 11.6–15.2)

## 2012-12-20 LAB — HEMOGLOBIN AND HEMATOCRIT, BLOOD: Hemoglobin: 8.4 g/dL — ABNORMAL LOW (ref 12.0–15.0)

## 2012-12-20 MED ORDER — FUROSEMIDE 20 MG PO TABS: 20.0000 mg | ORAL_TABLET | Freq: Every day | ORAL | Status: DC

## 2012-12-20 MED ORDER — TRAZODONE HCL 50 MG PO TABS: 50.0000 mg | ORAL_TABLET | Freq: Every evening | ORAL | Status: DC | PRN

## 2012-12-20 MED ORDER — SODIUM CHLORIDE 0.9 % IV SOLN: 2.0000 g | INTRAVENOUS | Status: DC

## 2012-12-20 MED ORDER — BOOST / RESOURCE BREEZE PO LIQD: 1.0000 | Freq: Two times a day (BID) | ORAL | Status: DC

## 2012-12-20 MED ORDER — WARFARIN SODIUM 3 MG PO TABS
3.0000 mg | ORAL_TABLET | Freq: Once | ORAL | Status: AC
  Administered 2012-12-20: 17:00:00 3 mg via ORAL
  Filled 2012-12-20: qty 1

## 2012-12-20 NOTE — Progress Notes (Signed)
Case Management was called regarding restart of Home Health IV antibiotics upon the pt's discharge.  A message was left to resume home health on this pt.  Pt and daughter were also advised to phone home health to ensure they are coming for this evening's dose of antibiotics.

## 2012-12-20 NOTE — Progress Notes (Signed)
   CARE MANAGEMENT NOTE 12/20/2012  Patient:  [name]   Account Number:  [id number]  Date Initiated:  12/20/2012  Documentation initiated by:  [facility]  Subjective/Objective Assessment:   adm: nausea, VOMITING and one episode of diarrhea     Action/Plan:   discharge planning   Anticipated DC Date:  12/20/2012   Anticipated DC Plan:  HOME W HOME HEALTH SERVICES      DC Planning Services  CM consult      [facility] Choice  Resumption Of Svcs/PTA Provider   Choice offered to / List presented to:     DME arranged  IV PUMP/EQUIPMENT        HH arranged  IV Antibiotics      Status of service:  Completed, signed off Medicare Important Message given?   (If response is "NO", the following Medicare IM given date fields will be blank) Date Medicare IM given:   Date Additional Medicare IM given:    Discharge Disposition:  HOME W HOME HEALTH SERVICES  Per UR Regulation:    If discussed at Long Length of Stay Meetings, dates discussed:    Comments:  12/20/12 14:30 CM spoke to pt who stated she has no pump at home for IV ABX which she was previously on at home.  Last run of IV ABX will be at 17:00 her in the hospital.  [facility] was notified and [facility] will drop off [name] new pump and IV ABX to room prior to discharge.  Pt states she is taken care of by daughter and daughter is proficient at operating the pump and running IV ABX.  [facility] agreed daughter has been running IV ABX and all parties feel comfortable in resumption of IV ABX at home beginning with 21:00 run of IV ABX this evening.  Resumption order and RN for PICC care to be placed.  No other CM needs were communicated.  Freddy Jaksch, BSN, CM [phone].

## 2012-12-20 NOTE — Discharge Summary (Signed)
Physician Discharge Summary  [name] BJY:[id number] DOB: 1941-03-26 DOA: 12/16/2012  PCP: [name], MD  Admit date: 12/16/2012 Discharge date: 12/20/2012  Time spent: 35 minutes  Recommendations for Outpatient Follow-up:  Continue home health with therapy. Continue antibiotics as prescribed by infectious disease for endocarditis. Follow up with PCP within one week of discharge.   Discharge Diagnoses:  Principal Problem:   Anemia, possible of chronic disease Active Problems:   Atrial fibrillation   Endocarditis   Bacteremia   Nausea with vomiting   Lupus   Severe malnutrition   Discharge Condition: Stable  Diet recommendation: Heart healthy, low sodium, 1.5L/day fluid restriction  Filed Weights   12/18/12 0336 12/19/12 0402 12/20/12 0524  Weight: 69.8 kg (153 lb 14.1 oz) 69.2 kg (152 lb 8.9 oz) 73.483 kg (162 lb)    History of present illness:  [name] is a 72 y.o. female with a history of 2 previous episodes of endocarditis, prosthetic mitral valve, Lupus, Pulmonary fibrosis, Afib, NSTEMI, HLD and CAD discharged yesterday came in today with nausea, VOMITING and one episode of diarrhea. On arrival to ED, pt was on 2 lit [place] oxygen, slightly dehydrated, but comfortable, did not have any complaints. Her labs revealed mild renal insufficiency and hyperkalemia. She is referred to medical service for observation.   Hospital Course:  This is a 72 year old female with a history of recurrent endocarditis, prosthetic mitral valve, Lupus, pulmonary fibrosis, atrial fibrillation, and [place], hyperlipidemia, coronary disease that was discharged earlier this week. She came in to state for nausea vomiting and weakness. Since her hospitalization patient is no longer had any nausea or vomiting. This was thought to possibly be secondary to her recent hospitalization and use of antibiotics. Her antibiotics for her endocarditis has been continued with ampicillin and gentamicin.  Infectious disease was also consulted and was following. Patient's PICC line still in place and was also evaluated and found to be good for use. During her hospital course patient's hemoglobin did fall to approximately 7.8. At which point a fecal cold blood test was conducted in gastroenterology was consulted.  No intervention was conducted as patient did have endoscopy as well as colonoscopy in 2013. Patient's occult blood was positive on 12/16/2012 however negative on 12/19/2012. Her hemoglobin did remain stable. On day of discharge her hemoglobin was found to be 8.4.  This is thought to be either secondary to anemia of chronic disease or GI losses. Patient did have an iron workup back in 2013 which showed that she had normal ferritin levels. She should possibly follow up with her primary care physician regarding this.  As for her weakness. Patient did have physical therapy come and evaluate her. They found no need for physical therapy at this time. Patient also does have some calorie malnutrition nutrition was consulted and she was placed on nutritional supplementation.  A urinalysis was conducted on 12/17/2012, and was found to be negative. No cultures have returned.  As the patient's other chronic problems including lupus with pulmonary fibrosis, atrial fibrillation, pulmonary congestion, these remain stable we continued her home medications.  Again patient will be discharged to home she should follow up with her primary care physician as well as infectious disease. She should continue doing her ampicillin and gentamicin through home health.  Procedures:  None  Consultations:  Infectious Disease, Gastroenterology  Discharge Exam: Filed Vitals:   12/20/12 0930  BP: 111/72  Pulse: 70  Temp:   Resp:     General:  Well  developed, NAD HEENT: [place]/AT,PERRAL  Neck: Supple Neck,No JVD, No cervical lymphadenopathy appriciated.  Respiratory: Symmetrical Chest wall movement, Good air movement  bilaterally, CTAB  Cardiovascular: RRR. No Gallops,Rubs or new Murmurs  Abdomen: Soft, nontender, nondistended, positive bowel sounds.  Extremities:No Cyanosis, Clubbing, edema  Neuro: Awake Alert, Oriented X 3, No new deficits, Grossly intact, nonfocal. Psych: Normal affect and mood.  Discharge Instructions  Discharge Orders   Future Orders Complete By Expires   Diet - low sodium heart healthy  As directed    Scheduling Instructions:     1.5L/day fluid restriction   Discharge instructions  As directed    Comments:     Continue home health with therapy.  Continue antibiotics as prescribed by infectious disease for endocarditis.  Follow up with PCP within one week of discharge.   Increase activity slowly  As directed        Medication List         digoxin 0.125 MG tablet  Commonly known as:  LANOXIN  Take 0.125 mg by mouth every morning.     feeding supplement Liqd  Take 1 Container by mouth 2 (two) times daily between meals.     furosemide 20 MG tablet  Commonly known as:  LASIX  Take 1 tablet (20 mg total) by mouth daily.     gentamicin 1.2-0.9 MG/ML-%  Commonly known as:  GARAMYCIN  Inject 50 mLs (60 mg total) into the vein every 12 (twelve) hours.     hydroxychloroquine 200 MG tablet  Commonly known as:  PLAQUENIL  Take 200 mg by mouth 2 (two) times daily.     IRON PO  Take 1 tablet by mouth 2 (two) times daily.     metoprolol 50 MG tablet  Commonly known as:  LOPRESSOR  Take 0.5 tablets (25 mg total) by mouth 2 (two) times daily.     pantoprazole 40 MG tablet  Commonly known as:  PROTONIX  Take 40 mg by mouth every morning.     potassium chloride SA 20 MEQ tablet  Commonly known as:  K-DUR,KLOR-CON  Take 40-60 mEq by mouth 2 (two) times daily. Takes 2 tablets every morning and takes 3 tablets every evening.     predniSONE 5 MG tablet  Commonly known as:  DELTASONE  Take 1 tablet (5 mg total) by mouth every morning.     rOPINIRole 1 MG tablet   Commonly known as:  REQUIP  Take 1 mg by mouth at bedtime.     rosuvastatin 10 MG tablet  Commonly known as:  CRESTOR  Take 10 mg by mouth every morning.     sodium chloride 0.9 % SOLN 50 mL with ampicillin 2 G SOLR 2 g  Inject 2 g into the vein every 4 (four) hours.     sodium chloride 0.9 % SOLN 50 mL with ampicillin 2 G SOLR 2 g  Inject 2 g into the vein every 4 (four) hours.     sulfaSALAzine 500 MG tablet  Commonly known as:  AZULFIDINE  Take 1,000 mg by mouth 2 (two) times daily.     traZODone 50 MG tablet  Commonly known as:  DESYREL  Take 1 tablet (50 mg total) by mouth at bedtime as needed for sleep.     warfarin 3 MG tablet  Commonly known as:  COUMADIN  Take 3 mg by mouth every morning.       Allergies  Allergen Reactions  . Ceftriaxone     RASH  BUT TOLERATED AMPICILLIN AND IMIPENEM WITHOUT PROBLEMS  . Codeine Nausea Only       Follow-up Information   Follow up with [name], MD In 1 week.   Specialty:  Cardiology   Contact information:   [address] [place] [address] [phone]        The results of significant diagnostics from this hospitalization (including imaging, microbiology, ancillary and laboratory) are listed below for reference.    Significant Diagnostic Studies: Ir Fluoro Guide Cv Line Right  12/15/2012   *RADIOLOGY REPORT*  PICC PLACEMENT WITH ULTRASOUND AND FLUOROSCOPIC  GUIDANCE  Clinical History: 72 year old female with endocarditis in need of durable central venous access for outpatient antibiotic therapy.  Fluoroscopy Time: 6 seconds  Procedure:  The right arm was prepped with chlorhexidine, draped in the usual sterile fashion using maximum barrier technique (cap and mask, sterile gown, sterile gloves, large sterile sheet, hand hygiene and cutaneous antiseptic).  Local anesthesia was attained by infiltration with 1% lidocaine.  Ultrasound demonstrated patency of the right basilic vein, and this was  documented with an image.  Under real-time ultrasound guidance, this vein was accessed with a 21 gauge micropuncture needle and image documentation was performed.  The needle was exchanged over a guidewire for a peel-away sheath through which a 41 cm 5 [place] dual lumen power injectable PICC was advanced, and positioned with its tip at the lower SVC/right atrial junction.  Fluoroscopy during the procedure and fluoro spot radiograph confirms appropriate catheter position.  The catheter was flushed, secured to the skin with Prolene sutures, and covered with a sterile dressing.  Complications:  None.  The patient tolerated the procedure well.  IMPRESSION:  Successful placement of a right basilic vein approach dual lumen power PICC with sonographic and fluoroscopic guidance.  The catheter is ready for use.  Signed,  Sterling Big, MD [facility] Radiology   Original Report Authenticated By: Malachy Moan, M.D.   Ir US Guide Vasc Access Right  12/15/2012   *RADIOLOGY REPORT*  PICC PLACEMENT WITH ULTRASOUND AND FLUOROSCOPIC  GUIDANCE  Clinical History: 72 year old female with endocarditis in need of durable central venous access for outpatient antibiotic therapy.  Fluoroscopy Time: 6 seconds  Procedure:  The right arm was prepped with chlorhexidine, draped in the usual sterile fashion using maximum barrier technique (cap and mask, sterile gown, sterile gloves, large sterile sheet, hand hygiene and cutaneous antiseptic).  Local anesthesia was attained by infiltration with 1% lidocaine.  Ultrasound demonstrated patency of the right basilic vein, and this was documented with an image.  Under real-time ultrasound guidance, this vein was accessed with a 21 gauge micropuncture needle and image documentation was performed.  The needle was exchanged over a guidewire for a peel-away sheath through which a 41 cm 5 [place] dual lumen power injectable PICC was advanced, and  positioned with its tip at the lower SVC/right atrial junction.  Fluoroscopy during the procedure and fluoro spot radiograph confirms appropriate catheter position.  The catheter was flushed, secured to the skin with Prolene sutures, and covered with a sterile dressing.  Complications:  None.  The patient tolerated the procedure well.  IMPRESSION:  Successful placement of a right basilic vein approach dual lumen power PICC with sonographic and fluoroscopic guidance.  The catheter is ready for use.  Signed,  Sterling Big, MD [facility] Radiology   Original Report Authenticated By: Malachy Moan, M.D.   Dg Abd  Acute W/chest  12/16/2012   *RADIOLOGY REPORT*  Clinical Data: Vomiting.  ACUTE ABDOMEN SERIES (ABDOMEN 2 VIEW & CHEST 1 VIEW)  Comparison: Chest x-ray 07/21/2012.  Findings: The heart is enlarged.  Previous CABG.  PICC line from right arm approach lies at the cavoatrial junction.  Mild vascular congestion.  Overall improved congestive failure from priors.  No effusion or pneumothorax.  Flat and decubitus abdomen reveals no free air over the liver.  Gas pattern is nonobstructive.  Moderate vascular calcification is seen.  Degenerative changes lumbar spine with levoconvex scoliosis but no osseous lesions.  IMPRESSION: Moderate vascular congestion without overt failure.  Nonobstructive gas pattern.   Original Report Authenticated By: Davonna Belling, M.D.    Microbiology: Recent Results (from the past 240 hour(s))  CULTURE, BLOOD (SINGLE)     Status: None   Collection Time    12/11/12  2:28 PM      Result Value Range Status   Organism ID, Bacteria ENTEROCOCCUS SPECIES   Final   Comment: Susceptibilities performed on previous culture     within the last 5 days.     Gram Stain Report Called to,Read Back By     and Verified With:     DR VAN DAM 12/15/12 1520 BY SMITHERSJ  CULTURE, BLOOD (SINGLE)     Status: None   Collection Time    12/11/12   2:28 PM      Result Value Range Status   Organism ID, Bacteria ENTEROCOCCUS SPECIES   Final   Comment: Susceptibilities performed on previous culture     within the last 5 days.     Gram Stain Report Called to,Read Back By     and Verified With:     DR VAN DAM 12/15/12 1518 BY SMITHERSJ  CULTURE, BLOOD (ROUTINE X 2)     Status: None   Collection Time    12/13/12  8:30 AM      Result Value Range Status   Specimen Description BLOOD LEFT ARM   Final   Special Requests BOTTLES DRAWN AEROBIC AND ANAEROBIC 10CC   Final   Culture  Setup Time     Final   Value: 12/14/2012 13:27     Performed at [facility]   Culture     Final   Value:        BLOOD CULTURE RECEIVED NO GROWTH TO DATE CULTURE WILL BE HELD FOR 5 DAYS BEFORE ISSUING A FINAL NEGATIVE REPORT     Performed at [facility]   Report Status PENDING   Incomplete  CULTURE, BLOOD (ROUTINE X 2)     Status: None   Collection Time    12/13/12  9:45 AM      Result Value Range Status   Specimen Description BLOOD LEFT ARM   Final   Special Requests BOTTLES DRAWN AEROBIC AND ANAEROBIC 10CC   Final   Culture  Setup Time     Final   Value: 12/14/2012 13:27     Performed at [facility]   Culture     Final   Value:        BLOOD CULTURE RECEIVED NO GROWTH TO DATE CULTURE WILL BE HELD FOR 5 DAYS BEFORE ISSUING A FINAL NEGATIVE REPORT     Performed at [facility]   Report Status PENDING   Incomplete  CULTURE, BLOOD (ROUTINE X 2)     Status: None   Collection Time    12/16/12  6:15 PM  Result Value Range Status   Specimen Description BLOOD LEFT ARM   Final   Special Requests BOTTLES DRAWN AEROBIC ONLY 3CC   Final   Culture  Setup Time     Final   Value: 12/17/2012 01:49     Performed at [facility]   Culture     Final   Value:        BLOOD CULTURE RECEIVED NO GROWTH TO DATE CULTURE WILL BE HELD FOR 5 DAYS BEFORE ISSUING A FINAL NEGATIVE REPORT     Performed at [facility]   Report  Status PENDING   Incomplete  CULTURE, BLOOD (ROUTINE X 2)     Status: None   Collection Time    12/16/12  6:25 PM      Result Value Range Status   Specimen Description BLOOD LEFT FOREARM   Final   Special Requests BOTTLES DRAWN AEROBIC ONLY 3CC   Final   Culture  Setup Time     Final   Value: 12/17/2012 01:49     Performed at [facility]   Culture     Final   Value:        BLOOD CULTURE RECEIVED NO GROWTH TO DATE CULTURE WILL BE HELD FOR 5 DAYS BEFORE ISSUING A FINAL NEGATIVE REPORT     Performed at [facility]   Report Status PENDING   Incomplete  URINE CULTURE     Status: None   Collection Time    12/17/12  3:45 AM      Result Value Range Status   Specimen Description URINE, CLEAN CATCH   Final   Special Requests Normal   Final   Culture  Setup Time     Final   Value: 12/17/2012 09:46     Performed at [facility] Count     Final   Value: NO GROWTH     Performed at [facility]   Culture     Final   Value: NO GROWTH     Performed at [facility]   Report Status 12/18/2012 FINAL   Final     Labs: Basic Metabolic Panel:  Recent Labs Lab 12/15/12 0500 12/16/12 0845 12/16/12 1555 12/18/12 0525 12/19/12 1045  NA 134* 132* 134* 139 136  K 4.0 5.4* 4.5 4.5 4.0  CL 102 102 103 108 104  CO2 23 20 19 20 20   GLUCOSE 98 108* 97 78 115*  BUN 18 17 15 20 17   CREATININE 0.99 1.12* 1.04 1.23* 1.08  CALCIUM 8.6 8.8 8.6 8.0* 8.0*   Liver Function Tests:  Recent Labs Lab 12/16/12 0845  AST 21  ALT 6  ALKPHOS 112  BILITOT 0.6  PROT 6.9  ALBUMIN 2.5*    Recent Labs Lab 12/16/12 0845  LIPASE 64*   No results found for this basename: AMMONIA,  in the last 168 hours CBC:  Recent Labs Lab 12/16/12 0845 12/18/12 0525 12/19/12 1045 12/20/12 1115  WBC 8.8 4.7 5.2  --   NEUTROABS 6.7  --   --   --   HGB 8.9* 7.9* 8.8* 8.4*  HCT 26.5* 23.9* 26.2* 24.9*  MCV 90.1 91.2 90.7  --   PLT 131* 115* 143*  --     Cardiac Enzymes:  Recent Labs Lab 12/13/12 1704 12/14/12 0531  CKTOTAL 33 27   BNP: BNP (last 3 results)  Recent Labs  07/11/12 1901 07/14/12 1651  PROBNP 12830.0* 5791.0*   CBG:  No results found for this basename: GLUCAP,  in the last 168 hours     Signed:  Edsel Petrin  [facility] 12/20/2012, 11:47 AM

## 2012-12-23 LAB — CULTURE, BLOOD (ROUTINE X 2)

## 2013-01-05 ENCOUNTER — Ambulatory Visit: Payer: Medicare Other | Admitting: Internal Medicine

## 2013-01-09 ENCOUNTER — Encounter: Payer: Self-pay | Admitting: Infectious Disease

## 2013-01-13 ENCOUNTER — Encounter: Payer: Self-pay | Admitting: Adult Health

## 2013-01-13 ENCOUNTER — Ambulatory Visit (INDEPENDENT_AMBULATORY_CARE_PROVIDER_SITE_OTHER): Payer: Medicare Other | Admitting: Adult Health

## 2013-01-13 VITALS — BP 122/84 | HR 80 | Temp 98.0°F | Ht 67.0 in | Wt 149.0 lb

## 2013-01-13 DIAGNOSIS — J8410 Pulmonary fibrosis, unspecified: Secondary | ICD-10-CM

## 2013-01-13 NOTE — Patient Instructions (Signed)
We are setting you up for an overnight oximetry test on room air.  You may continue off Oxygen for now.  Continue on current regimen  follow up Dr. Delford Field  In 3 months and As needed

## 2013-01-14 ENCOUNTER — Encounter: Payer: Self-pay | Admitting: Adult Health

## 2013-01-14 NOTE — Progress Notes (Signed)
Subjective:    Patient ID: [name], female    DOB: [birth year], 72 y.o.   MRN: [id number]  HPI  72 y.o.  white female history of mixed connected tissue disease, pulmonary fibrosis, pneumonitis,   09/05/2011 Since last ov doing well. No more bleeding /anemia issues.  Now no cough.  Dyspnea the same.  No chest pains.  No real edema in feet. Pt denies any significant sore throat, nasal congestion or excess secretions, fever, chills, sweats, unintended weight loss, pleurtic or exertional chest pain, orthopnea PND, or leg swelling Pt denies any increase in rescue therapy over baseline, denies waking up needing it or having any early am or nocturnal exacerbations of coughing/wheezing/or dyspnea. Pt also denies any obvious fluctuation in symptoms with  weather or environmental change or other alleviating or aggravating factors  01/04/2012 Pt suddenly worse since last ov, insideous.  Notes more dyspnea, not able to do any activity. No chest pain. No real cough.  No mucus.  No edema in feet.  No arthritis.  Notes qhs dyspnea.   No f/c/s.  Now Binlista for lupus for months.  Notes more heartburn recently esp at night.  01/11/2012 Pt seen one week ago for PNA.  No real change after 10days of Avelox Sats 80% on arrival.  Pt feels the same from one week ago.  No real cough.  No chest pain or fever. Pt notes pndrip.  No leg pain.  No heartburn at night now.  Notes some QHS dyspnea and awakens in a panic state.  Pt notes severe DOE  01/29/2012 Pt worse since off prednisone.   Notes more weakness. Notes dry cough. Nose is irritated from oxygen No chest pain.   No f/c/s.   >>pred burst.   01/13/13 Follow up  Pt returns for follow up , accompanied by daughter.  Requests Oxygen be discontinued as she is no longer using . Ambulatory sats in office without desats.  Says that her breathing has been doing well with no flare in cough. CXR last month showed moderate vascular congestion without overt  failure .  Denies cough, fever, orthopnea or edema.   Recently admitted with reoccurence of endocarditis. Discharged on long term antibiotics-has PICC line.  Followed by ID clinic and cards.   Remains on Prednisone 5mg  daily and Plaquenil for SLE  On coumadin with coumadin clinic following.     Past Medical History  Diagnosis Date  . Coronary artery disease   . Atrial fibrillation     Now NSR  . GERD (gastroesophageal reflux disease)   . Hyperlipidemia   . 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 Anemia   . Angina   . Hypertension   . Blood transfusion     "related to bad case of bronchitis once; 1 yr ago given due to GIB" (12/17/2012)  . H/O hiatal hernia   . Lupus     "that's compromised her lungs somewhat" (12/17/2012)  . Insomnia   . Endocarditis   . Prosthetic valve endocarditis   . Enterococcal bacteremia   . Heart murmur   . CHF (congestive heart failure)   . Pneumonia     "once or twice" (12/17/2012)  . Chronic bronchitis     "used to get it once/yr; hasn't had it since ~ 1986" (12/17/2012)  . Shortness of breath     "all the times sometimes" (12/17/2012)  . On home oxygen therapy     "2L prn; always  at night" (12/17/2012)  . Lower GI bleed ~ 01/2012  . Arthritis     "hands" (12/17/2012)  . 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 Heart disease Mother     CHF  . Mental illness Father     suicide  . Cancer Sister     pancreatic cancer  . COPD Brother      History   Social History  . Marital Status: Divorced    Spouse Name: N/A    Number of Children: N/A  . Years of Education: N/A   Occupational History  . Not on file.   Social History Main Topics  . Smoking status: Never Smoker   . Smokeless tobacco: Never Used  . Alcohol Use: No  . Drug Use: No  . Sexual Activity: No   Other Topics Concern  . Not on file   Social History Narrative  . No  narrative on file     Allergies  Allergen Reactions  . Ceftriaxone     RASH BUT TOLERATED AMPICILLIN AND IMIPENEM WITHOUT PROBLEMS  . Codeine Nausea Only     Outpatient Prescriptions Prior to Visit  Medication Sig Dispense Refill  . digoxin (LANOXIN) 0.125 MG tablet Take 0.125 mg by mouth every morning.      . furosemide (LASIX) 20 MG tablet Take 1 tablet (20 mg total) by mouth daily.  30 tablet  0  . gentamicin (GARAMYCIN) 1.2-0.9 MG/ML-% Inject 50 mLs (60 mg total) into the vein every 12 (twelve) hours.  50 mL  0  . hydroxychloroquine (PLAQUENIL) 200 MG tablet Take 200 mg by mouth 2 (two) times daily.       . IRON PO Take 1 tablet by mouth 2 (two) times daily.      . metoprolol (LOPRESSOR) 50 MG tablet Take 0.5 tablets (25 mg total) by mouth 2 (two) times daily.  60 tablet  3  . pantoprazole (PROTONIX) 40 MG tablet Take 40 mg by mouth every morning.      . potassium chloride SA (K-DUR,KLOR-CON) 20 MEQ tablet Take 40-60 mEq by mouth 2 (two) times daily. Takes 2 tablets every morning and takes 3 tablets every evening.      . predniSONE (DELTASONE) 5 MG tablet Take 1 tablet (5 mg total) by mouth every morning.  30 tablet  2  . rOPINIRole (REQUIP) 1 MG tablet Take 1 mg by mouth at bedtime.       . rosuvastatin (CRESTOR) 10 MG tablet Take 10 mg by mouth every morning.       . sodium chloride 0.9 % SOLN 50 mL with ampicillin 2 G SOLR 2 g Inject 2 g into the vein every 4 (four) hours.  30 application  0  . sulfaSALAzine (AZULFIDINE) 500 MG tablet Take 1,000 mg by mouth 2 (two) times daily.       . traZODone (DESYREL) 50 MG tablet Take 1 tablet (50 mg total) by mouth at bedtime as needed for sleep.  20 tablet  0  . warfarin (COUMADIN) 3 MG tablet Take 3 mg by mouth every morning.      . feeding supplement (RESOURCE BREEZE) LIQD Take 1 Container by mouth 2 (two) times daily between meals.  1 mL  0  . sodium chloride 0.9 % SOLN 50 mL with ampicillin 2 G SOLR 2 g Inject 2 g into the vein every 4  (four)  hours.  2 g  0   No facility-administered medications prior to visit.      Review of Systems  Constitutional:   No  weight loss, night sweats,  Fevers, chills,  +fatigue, lassitude. HEENT:   No headaches,  Difficulty swallowing,  Tooth/dental problems,  Sore throat,                No sneezing, itching, ear ache, nasal congestion, + post nasal drip,   CV:  No chest pain,  Orthopnea, PND, swelling in lower extremities, anasarca, dizziness, palpitations  GI  No heartburn, indigestion, abdominal pain, nausea, vomiting, diarrhea, change in bowel habits, loss of appetite  Resp:   No coughing up of blood.  No chest wall deformity  Skin: no rash or lesions.  GU: no dysuria, change in color of urine, no urgency or frequency.  No flank pain.  MS:  No joint pain or swelling.  No decreased range of motion.  No back pain.  Psych:  No change in mood or affect. No depression or anxiety.  No memory loss.     Objective:   Physical Exam  Filed Vitals:   01/13/13 1639  BP: 122/84  Pulse: 80  Temp: 98 F (36.7 C)  TempSrc: Oral  Height: 5\' 7"  (1.702 m)  Weight: 149 lb (67.586 kg)  SpO2: 93%    Gen: Pleasant, elderly, frail , in no distress,  normal affect  ENT: No lesions,  mouth clear,  oropharynx clear, no postnasal drip  Neck: No JVD, no TMG, no carotid bruits  Lungs: No use of accessory muscles, no dullness to percussion,dry rales    Cardiovascular: RRR, heart sounds normal, no murmur or gallops, no peripheral edema R. PICC   Abdomen: soft and NT, no HSM,  BS normal  Musculoskeletal: No deformities, no cyanosis or clubbing  Neuro: alert, non focal  Skin: Warm, no lesions or rashes     Assessment & Plan:

## 2013-01-14 NOTE — Assessment & Plan Note (Signed)
In setting of mixed connective tissue disease with Lupus  Doing well without flare  No desats in office with ambulation , will set up ONO , if not desats will d/c O2.   Plan  We are setting you up for an overnight oximetry test on room air.  You may continue off Oxygen for now.  Continue on current regimen  follow up Dr. Delford Field  In 3 months and As needed

## 2013-01-22 ENCOUNTER — Ambulatory Visit (INDEPENDENT_AMBULATORY_CARE_PROVIDER_SITE_OTHER): Payer: Medicare Other | Admitting: Internal Medicine

## 2013-01-22 ENCOUNTER — Encounter: Payer: Self-pay | Admitting: Internal Medicine

## 2013-01-22 VITALS — BP 152/76 | HR 64 | Temp 98.1°F | Wt 149.8 lb

## 2013-01-22 DIAGNOSIS — T826XXD Infection and inflammatory reaction due to cardiac valve prosthesis, subsequent encounter: Secondary | ICD-10-CM

## 2013-01-22 DIAGNOSIS — E785 Hyperlipidemia, unspecified: Secondary | ICD-10-CM

## 2013-01-22 DIAGNOSIS — I4891 Unspecified atrial fibrillation: Secondary | ICD-10-CM

## 2013-01-22 DIAGNOSIS — G2581 Restless legs syndrome: Secondary | ICD-10-CM

## 2013-01-22 DIAGNOSIS — D649 Anemia, unspecified: Secondary | ICD-10-CM

## 2013-01-22 DIAGNOSIS — I38 Endocarditis, valve unspecified: Secondary | ICD-10-CM

## 2013-01-22 DIAGNOSIS — Z5189 Encounter for other specified aftercare: Secondary | ICD-10-CM

## 2013-01-22 DIAGNOSIS — J8410 Pulmonary fibrosis, unspecified: Secondary | ICD-10-CM

## 2013-01-22 MED ORDER — GABAPENTIN 300 MG PO CAPS: 300.0000 mg | ORAL_CAPSULE | Freq: Every day | ORAL | Status: DC

## 2013-01-22 NOTE — Progress Notes (Signed)
Patient ID: [name], female   DOB: [birth year], 72 y.o.   MRN: [id number] Location:  [facility]   Allergies  Allergen Reactions  . Ceftriaxone     RASH BUT TOLERATED AMPICILLIN AND IMIPENEM WITHOUT PROBLEMS  . Codeine Nausea Only    Chief Complaint  Patient presents with  . Medical Managment of Chronic Issues    3 month f/u  . other    weight loss    HPI: Patient is a 72 y.o. female seen in the office today for management of chronic issues. Pt. Was hospitalized about 1 month ago for endocarditis and sent home on IV antibiotics (gent and ampicillin). Daughter states that this is her third time with endocarditis. Daughter states that pt. Is doing better this time than the previous times the pt. Has had endocarditis. Pt. Agrees that she is doing better this time.   Pt. Is concerned about weight loss. Daughter states that in the last 6 months she has lost 60 lbs. Daughter contributes it to have endocarditis multiple times, she was also living by herself at the time and she doesn't like to cook food. Since leaving from the hospital this last time she is living with her daughter. Pt. States appetite comes and goes. Daughter states that some days she eats really well, feeling that she can't fix enough food for her and then other days she can't force food. Daughter states that there was about 6 months between the three episodes of endocarditis and each time she never returned to her old self.   Pt. States that overall her mood is good. States that under her circumstances she does have times where she feels down.   Still has 2-3 more wks of IV abx and then probably a longstanding po abx forever.  Getting twice weekly labs.  Doesn't sleep great.  Takes restless legs medicine, but it's not that effective.     Review of Systems:  Review of Systems  Constitutional: Positive for weight loss and malaise/fatigue. Negative for fever and chills.   Respiratory: Negative for shortness of breath.   Cardiovascular: Negative for chest pain.  Gastrointestinal: Positive for nausea.       Occasionally  Musculoskeletal: Negative for falls.  Neurological: Negative for dizziness.  Psychiatric/Behavioral: Negative for depression. The patient is not nervous/anxious.      Past Medical History  Diagnosis Date  . Coronary artery disease   . Atrial fibrillation     Now NSR  . GERD (gastroesophageal reflux disease)   . Hyperlipidemia   . 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 Anemia   . Angina   . Hypertension   . Blood transfusion     "related to bad case of bronchitis once; 1 yr ago given due to GIB" (12/17/2012)  . H/O hiatal hernia   . Lupus     "that's compromised her lungs somewhat" (12/17/2012)  . Insomnia   . Endocarditis   . Prosthetic valve endocarditis   . Enterococcal bacteremia   . Heart murmur   . CHF (congestive heart failure)   . Pneumonia     "once or twice" (12/17/2012)  . Chronic bronchitis     "used to get it once/yr; hasn't had it since ~ 1986" (12/17/2012)  . Shortness of breath     "all the times sometimes" (12/17/2012)  . On home oxygen therapy     "2L prn; always at night" (  12/17/2012)  . Lower GI bleed ~ 01/2012  . Arthritis     "hands" (12/17/2012)  . 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 Givens capsule study  04/09/2011    Procedure: GIVENS CAPSULE STUDY;  Surgeon: Theda Belfast, MD;  Location: [facility] ENDOSCOPY;  Service: Endoscopy;  Laterality: N/A;  . Tee without cardioversion  02/06/2012    Procedure: TRANSESOPHAGEAL ECHOCARDIOGRAM (TEE);  Surgeon: Ricki Rodriguez, MD;  Location: [facility] ENDOSCOPY;  Service: Cardiovascular;  Laterality: N/A;  . Esophagogastroduodenoscopy  02/12/2012    Procedure: ESOPHAGOGASTRODUODENOSCOPY (EGD);  Surgeon: Theda Belfast, MD;  Location: [facility] ENDOSCOPY;  Service: Endoscopy;   Laterality: N/A;  . Tee without cardioversion N/A 07/15/2012    Procedure: TRANSESOPHAGEAL ECHOCARDIOGRAM (TEE);  Surgeon: Ricki Rodriguez, MD;  Location: [facility] ENDOSCOPY;  Service: Cardiovascular;  Laterality: N/A;  . Breast lumpectomy Left 1977    "benign" (12/17/2012)  . Tonsillectomy  1940's  . Coronary artery bypass graft  2006  . 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   . Cardiac catheterization      "probably 2-3" (12/17/2012)  . Coronary angioplasty with stent placement      "several put in over the years" (12/17/2012)  . Cataract extraction w/ intraocular lens  implant, bilateral Bilateral 11/2000  . Peripherally inserted central catheter insertion Right 12/15/2012    "upper arm" (12/17/2012    Social History:   reports that she has never smoked. She has never used smokeless tobacco. She reports that she does not drink alcohol or use illicit drugs.  Family History  Problem Relation Age of Onset  . Heart disease Mother     CHF  . Mental illness Father     suicide  . Cancer Sister     pancreatic cancer  . COPD Brother     Medications: Patient's Medications  New Prescriptions   No medications on file  Previous Medications   DIGOXIN (LANOXIN) 0.125 MG TABLET    Take 0.125 mg by mouth every morning.   FUROSEMIDE (LASIX) 20 MG TABLET    Take 1 tablet (20 mg total) by mouth daily.   GENTAMICIN (GARAMYCIN) 1.2-0.9 MG/ML-%    Inject 50 mLs (60 mg total) into the vein every 12 (twelve) hours.   HYDROXYCHLOROQUINE (PLAQUENIL) 200 MG TABLET    Take 200 mg by mouth 2 (two) times daily.    IRON PO    Take 1 tablet by mouth 2 (two) times daily.   METOPROLOL (LOPRESSOR) 50 MG TABLET    Take 0.5 tablets (25 mg total) by mouth 2 (two) times daily.   PANTOPRAZOLE (PROTONIX) 40 MG TABLET    Take 40 mg by mouth every morning.   POTASSIUM CHLORIDE SA (K-DUR,KLOR-CON) 20 MEQ TABLET    Take 40-60 mEq by mouth 2 (two) times daily. Takes 2 tablets every morning and  takes 3 tablets every evening.   PREDNISONE (DELTASONE) 5 MG TABLET    Take 1 tablet (5 mg total) by mouth every morning.   ROPINIROLE (REQUIP) 1 MG TABLET    Take 1 mg by mouth at bedtime.    ROSUVASTATIN (CRESTOR) 10 MG TABLET    Take 10 mg by mouth every morning.    SODIUM CHLORIDE 0.9 % SOLN 50 ML WITH AMPICILLIN 2 G SOLR 2 G    Inject 2 g into the vein every 4 (four) hours.  SULFASALAZINE (AZULFIDINE) 500 MG TABLET    Take 1,000 mg by mouth 2 (two) times daily.    TRAZODONE (DESYREL) 50 MG TABLET    Take 1 tablet (50 mg total) by mouth at bedtime as needed for sleep.   WARFARIN (COUMADIN) 3 MG TABLET    Take 3 mg by mouth every morning.  Modified Medications   No medications on file  Discontinued Medications   No medications on file    Physical Exam: Filed Vitals:   01/22/13 1543  BP: 152/76  Pulse: 64  Temp: 98.1 F (36.7 C)   Physical Exam  Constitutional: She is oriented to person, place, and time. She appears well-developed.  Cardiovascular: Intact distal pulses.   Murmur heard. A-fib  Pulmonary/Chest: Effort normal and breath sounds normal.  Abdominal: Soft. Bowel sounds are normal.  Neurological: She is alert and oriented to person, place, and time.  Skin: Skin is warm and dry.  PICC in RUE w/o erythema, warmth, drainage  Psychiatric: She has a normal mood and affect.   Labs reviewed: Basic Metabolic Panel:  Recent Labs  29/56/21 2155  07/15/12 0420 07/16/12 2114  12/16/12 1555 12/18/12 0525 12/19/12 1045  NA 134*  < > 131* 132*  < > 134* 139 136  K 3.7  < > 3.8 4.3  < > 4.5 4.5 4.0  CL 99  < > 97 96  < > 103 108 104  CO2 24  < > 27 25  < > 19 20 20   GLUCOSE 114*  < > 98 110*  < > 97 78 115*  BUN 21  < > 22 20  < > 15 20 17   CREATININE 1.22*  < > 1.11* 1.19*  < > 1.04 1.23* 1.08  CALCIUM 8.5  < > 8.9 8.8  < > 8.6 8.0* 8.0*  MG 1.7  --   --   --   --   --   --   --   PHOS  --   --   --  3.1  --   --   --   --   TSH 1.844  --   --   --   --   --   --    --   < > = values in this interval not displayed. Liver Function Tests:  Recent Labs  12/11/12 1428 12/12/12 1357 12/16/12 0845  AST 13 14 21   ALT 8 5 6   ALKPHOS 98 116 112  BILITOT 0.8 0.5 0.6  PROT 7.2 7.5 6.9  ALBUMIN 3.3* 3.1* 2.5*    Recent Labs  06/29/12 1724 07/11/12 1901 12/16/12 0845  LIPASE 72* 49 64*  CBC:  Recent Labs  07/11/12 1901  12/11/12 1428  12/16/12 0845 12/18/12 0525 12/19/12 1045 12/20/12 1115  WBC 10.0  < > 7.5  < > 8.8 4.7 5.2  --   NEUTROABS 7.6  --  5.3  --  6.7  --   --   --   HGB 9.6*  < > 10.9*  < > 8.9* 7.9* 8.8* 8.4*  HCT 27.8*  < > 31.6*  < > 26.5* 23.9* 26.2* 24.9*  MCV 89.1  < > 88.3  < > 90.1 91.2 90.7  --   PLT 177  < > 148*  < > 131* 115* 143*  --   < > = values in this interval not displayed. Lipid Panel:  Recent Labs  02/04/12 0230  CHOL 88  HDL 28*  LDLCALC  48  TRIG 61  CHOLHDL 3.1   Assessment/Plan 1. Restless legs syndrome (RLS) -not benefiting from requip -will try gabapentin instead--to cut requip in 1/2 for three nights, then begin gabapentin--discussed that it may be sedating but is taking at night so may actually help her sleep since nothing else has worked for that thus far - gabapentin (NEURONTIN) 300 MG capsule; Take 1 capsule (300 mg total) by mouth at bedtime.  Dispense: 30 capsule; Refill: 3  2. Prosthetic valve endocarditis, subsequent encounter Third episode -continues on amp and gent for 2-3 more weeks through infectious disease and then will go on po abx indefinitely per daughter -tolerating better than previous rounds of abx -her daughter notes that her renal function has been declining with the gentamicin but these results are not currently available to me (last visible lab 9/29 was very good)  3. Atrial fibrillation -rate controlled -continues on coumadin--dose was reduced during hospitalization from 6mg  to 3mg  -monitored through cardiology closely especially in view of abx  4.  Postinflammatory pulmonary fibrosis -breathing has recently been quite good -not wearing O2 today  5. Other and unspecified hyperlipidemia -at goal with current therapy  6.  Normocytic Anemia -likely mixed iron deficiency, chronic disease, may even have some hemolytic from endocarditis -continues on bid iron supplement -last hgb I can see was 8.8  Labs/tests ordered: will get labs from home draws (her daughter is going to ask home care to send them to me, as well) Next appt:  3 mos

## 2013-01-23 ENCOUNTER — Telehealth: Payer: Self-pay | Admitting: Adult Health

## 2013-01-23 ENCOUNTER — Encounter: Payer: Self-pay | Admitting: Adult Health

## 2013-01-23 ENCOUNTER — Telehealth: Payer: Self-pay | Admitting: Infectious Disease

## 2013-01-23 NOTE — Telephone Encounter (Signed)
10.27.14 ONO results received from [facility] Per TP: ONO with + nocturnal desats, continue qhs O2  Called spoke with patient's daughter Boyd Kerbs and advised of positive ONO and TP's recs to continue to O2 2lpm at bedtime.  Boyd Kerbs verbalized her understanding and denied any questions.  Nothing further needed; will sign off.

## 2013-01-23 NOTE — Telephone Encounter (Signed)
Notified patient and she agreed to hold medication, and I notified [facility] as well. They will collect the labs stat per Debbie in the pharmacy.

## 2013-01-23 NOTE — Telephone Encounter (Signed)
Tamika can you ask ms Stettner to hold her lasix, her potassium and her HCTZ for the next 3 days and make sure that [facility] rechecks a BMP w GFR, CBC on Monday  her serum creatine it has risen to 1.33

## 2013-01-28 ENCOUNTER — Encounter: Payer: Self-pay | Admitting: Infectious Disease

## 2013-01-28 ENCOUNTER — Telehealth: Payer: Self-pay | Admitting: *Deleted

## 2013-01-28 NOTE — Telephone Encounter (Signed)
[facility] called to get a verbal on the adjustment of the patient Gentamicin to q48h dosing until 02/02/13 which is her stop date because her Creat is up to 1.62 from 1.33 on 10/31 and she just wants to be careful. Advised her will page the doctor and get the ok due to the patient age and overall health will have to call her back with the doctors response.

## 2013-01-28 NOTE — Telephone Encounter (Signed)
When was last serum creatinine measured?

## 2013-01-29 ENCOUNTER — Encounter: Payer: Self-pay | Admitting: Infectious Disease

## 2013-01-29 NOTE — Telephone Encounter (Signed)
It was drawn 01/27/13.

## 2013-01-29 NOTE — Telephone Encounter (Signed)
I would go with q 48 hour dosing but with BMP check tomorrow (friday). I spoke with Fredonia Highland re this just now

## 2013-01-30 ENCOUNTER — Encounter: Payer: Self-pay | Admitting: Infectious Disease

## 2013-01-30 NOTE — Telephone Encounter (Signed)
Lets stay the course thru Monday

## 2013-01-30 NOTE — Telephone Encounter (Signed)
[facility] went out yesterday instead of today, patient's creatinine is 1.65

## 2013-02-02 ENCOUNTER — Encounter: Payer: Self-pay | Admitting: Internal Medicine

## 2013-02-02 ENCOUNTER — Ambulatory Visit (INDEPENDENT_AMBULATORY_CARE_PROVIDER_SITE_OTHER): Payer: Medicare Other | Admitting: Internal Medicine

## 2013-02-02 VITALS — BP 145/80 | HR 91 | Temp 98.1°F | Ht 67.0 in | Wt 153.2 lb

## 2013-02-02 DIAGNOSIS — I38 Endocarditis, valve unspecified: Secondary | ICD-10-CM

## 2013-02-02 DIAGNOSIS — N289 Disorder of kidney and ureter, unspecified: Secondary | ICD-10-CM

## 2013-02-02 LAB — BASIC METABOLIC PANEL WITH GFR
BUN: 21 mg/dL (ref 6–23)
CO2: 23 mEq/L (ref 19–32)
Calcium: 8.4 mg/dL (ref 8.4–10.5)
Chloride: 101 mEq/L (ref 96–112)
Glucose, Bld: 110 mg/dL — ABNORMAL HIGH (ref 70–99)
Sodium: 137 mEq/L (ref 135–145)

## 2013-02-02 MED ORDER — AMOXICILLIN 500 MG PO CAPS: 500.0000 mg | ORAL_CAPSULE | Freq: Two times a day (BID) | ORAL | Status: DC

## 2013-02-02 NOTE — Progress Notes (Signed)
RN received verbal order to discontinue the patient's PICC line.  Patient identified with name and date of birth. PICC dressing removed, site unremarkable.  Sutures removed without difficulty.  PICC line removed using sterile procedure @ 1700. PICC length equal to that noted in patient's hospital chart of 41 cm. Sterile petroleum gauze + sterile 4X4 applied to PICC site, pressure applied for 10 minutes and covered with Medipore tape as a pressure dressing. Patient tolerated procedure without complaints.  Patient instructed to limit use of arm for 1 hour. Patient instructed that the pressure dressing should remain in place for 24 hours. Patient verbalized understanding of these instructions.

## 2013-02-02 NOTE — Progress Notes (Signed)
Patient ID: [name], female   DOB: October 20, 1940, 72 y.o.   MRN: [id number]         [facility]  Patient Active Problem List   Diagnosis Date Noted  . Endocarditis 03/05/2012    Priority: High  . Acute renal insufficiency 02/02/2013    Priority: Medium  . Protein-calorie malnutrition, severe 12/13/2012    Priority: Medium  . Unintentional weight loss 12/09/2012    Priority: Medium  . Lupus 12/17/2012  . Rash 03/05/2012  . NSTEMI (non-ST elevated myocardial infarction) 02/04/2012  . Pneumonia 01/05/2012  . 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 Nonspecific abnormal finding in stool contents 04/08/2011  . Anemia associated with acute blood loss 04/06/2011  . 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 Atrial fibrillation 12/13/2009  . HYPERLIPIDEMIA 01/27/2007  . RESTLESS LEG SYNDROME, MILD 01/27/2007  . CORONARY ARTERY DISEASE 01/27/2007  . 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 GERD 01/27/2007  . CORONARY ARTERY BYPASS GRAFT, HX OF 01/27/2007    Patient's Medications  New Prescriptions   AMOXICILLIN (AMOXIL) 500 MG CAPSULE    Take 1 capsule (500 mg total) by mouth 2 (two) times daily.  Previous Medications   DIGOXIN (LANOXIN) 0.125 MG TABLET    Take 0.125 mg by mouth every morning.   FUROSEMIDE (LASIX) 20 MG TABLET    Take 1 tablet (20 mg total) by mouth daily.   GABAPENTIN (NEURONTIN) 300 MG CAPSULE    Take 1 capsule (300 mg total) by mouth at bedtime.   HYDROXYCHLOROQUINE (PLAQUENIL) 200 MG TABLET    Take 200 mg by mouth 2 (two) times daily.    IRON PO    Take 1 tablet by mouth 2 (two) times daily.   METOPROLOL (LOPRESSOR) 50 MG TABLET    Take 0.5 tablets (25 mg total) by mouth 2 (two) times daily.   PANTOPRAZOLE (PROTONIX) 40 MG TABLET    Take 40 mg by mouth every morning.   POTASSIUM CHLORIDE SA (K-DUR,KLOR-CON) 20 MEQ TABLET    Take 40-60 mEq by mouth 2 (two) times daily. Takes 2 tablets every morning and takes 3 tablets  every evening.   PREDNISONE (DELTASONE) 5 MG TABLET    Take 1 tablet (5 mg total) by mouth every morning.   ROSUVASTATIN (CRESTOR) 10 MG TABLET    Take 10 mg by mouth every morning.    SULFASALAZINE (AZULFIDINE) 500 MG TABLET    Take 1,000 mg by mouth 2 (two) times daily.    TRAZODONE (DESYREL) 50 MG TABLET    Take 1 tablet (50 mg total) by mouth at bedtime as needed for sleep.   WARFARIN (COUMADIN) 3 MG TABLET    Take 3 mg by mouth every morning.  Modified Medications   No medications on file  Discontinued Medications   GENTAMICIN (GARAMYCIN) 1.2-0.9 MG/ML-%    Inject 60 mg into the vein every 12 (twelve) hours. Inject 70 ml once daily   SODIUM CHLORIDE 0.9 % SOLN 50 ML WITH AMPICILLIN 2 G SOLR 2 G    Inject 2 g into the vein every 4 (four) hours.    Subjective: [name] is in with her daughter for hospital followup visit. 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 Transesophageal echocardiogram during her last admission showed small vegetations on her prosthetic aortic valve and native mitral valve. With this  latest episode she was treated with IV ampicillin and gentamicin. She has now completed a 51 days of therapy after her first negative blood culture on September 20. She has had no problems tolerating her PICC. Her last creatinine had risen to 1.65. She was due to have lab work repeated last week but for some reason only a BUN was obtained. She is feeling markedly better. She has not had any fever, chills, sweats, chest pain, shortness of breath or cough. Her appetite is improved and she is gaining weight.  Review of Systems: Pertinent items are noted in HPI.  Past Medical History  Diagnosis Date  . Coronary artery disease   . Atrial fibrillation     Now NSR  . GERD (gastroesophageal reflux disease)   . Hyperlipidemia   . 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 Anemia   . Angina   . Hypertension   . Blood transfusion     "related to bad case of bronchitis once; 1 yr ago given due to GIB" (12/17/2012)  . H/O hiatal hernia   . Lupus     "that's compromised her lungs somewhat" (12/17/2012)  . Insomnia   . Endocarditis   . Prosthetic valve endocarditis   . Enterococcal bacteremia   . Heart murmur   . CHF (congestive heart failure)   . Pneumonia     "once or twice" (12/17/2012)  . Chronic bronchitis     "used to get it once/yr; hasn't had it since ~ 1986" (12/17/2012)  . Shortness of breath     "all the times sometimes" (12/17/2012)  . On home oxygen therapy     "2L prn; always at night" (12/17/2012)  . Lower GI bleed ~ 01/2012  . Arthritis     "hands" (12/17/2012)  . 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 Smoking status: Never Smoker   . Smokeless tobacco: Never Used  . Alcohol Use: No    Family History  Problem Relation Age of Onset  . Heart disease Mother     CHF  . Mental illness Father     suicide  . Cancer Sister     pancreatic cancer  . COPD Brother     Allergies  Allergen Reactions  . Ceftriaxone     RASH BUT TOLERATED AMPICILLIN AND IMIPENEM WITHOUT PROBLEMS  . Codeine Nausea Only    Objective: Temp: 98.1 F (36.7 C) (11/10 1605) Temp src: Oral (11/10 1605) BP: 145/80 mmHg (11/10 1605) Pulse Rate: 91 (11/10 1605)  General: Her weight is up 153.5. Her color is better and she is in good spirits Skin: No rash. A right arm PICC site appears normal Lungs: Clear Cor: Irregularly irregular S1 and S2 with a 1/6 systolic murmur heard best at the left midaxillary line   Assessment: She is much improved after her third round of treatment for enterococcal endocarditis. It is unlikely that medical therapy alone will be curative and she is not a candidate for valve replacement so we have made the decision to transition to long-term suppressive therapy  with oral amoxicillin. She is in agreement with that plan.  Plan: 1. Check basic metabolic panel with GFR today 2. Remove PICC 3. Discontinue ampicillin and gentamicin 4. Start amoxicillin 500 mg twice daily 5. Followup in one month   Cliffton Asters, MD [facility] [phone]  pager   [phone] cell 02/02/2013, 4:34 PM

## 2013-02-03 ENCOUNTER — Inpatient Hospital Stay: Payer: Medicare Other | Admitting: Internal Medicine

## 2013-02-10 ENCOUNTER — Encounter: Payer: Self-pay | Admitting: Infectious Disease

## 2013-02-10 ENCOUNTER — Ambulatory Visit: Payer: Medicare Other | Admitting: Critical Care Medicine

## 2013-02-11 ENCOUNTER — Encounter: Payer: Self-pay | Admitting: Infectious Disease

## 2013-02-23 ENCOUNTER — Ambulatory Visit (INDEPENDENT_AMBULATORY_CARE_PROVIDER_SITE_OTHER): Payer: Medicare Other | Admitting: Critical Care Medicine

## 2013-02-23 ENCOUNTER — Encounter: Payer: Self-pay | Admitting: Critical Care Medicine

## 2013-02-23 VITALS — BP 112/80 | HR 81 | Temp 98.0°F | Ht 67.0 in | Wt 154.8 lb

## 2013-02-23 DIAGNOSIS — J8410 Pulmonary fibrosis, unspecified: Secondary | ICD-10-CM

## 2013-02-23 NOTE — Assessment & Plan Note (Addendum)
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

## 2013-02-23 NOTE — Progress Notes (Signed)
Subjective:    Patient ID: [name], female    DOB: July 13, 1940, 72 y.o.   MRN: [id number]  HPI  72 y.o.  white female history of mixed connected tissue disease, pulmonary fibrosis, pneumonitis,   09/05/2011 Since last ov doing well. No more bleeding /anemia issues.  Now no cough.  Dyspnea the same.  No chest pains.  No real edema in feet. Pt denies any significant sore throat, nasal congestion or excess secretions, fever, chills, sweats, unintended weight loss, pleurtic or exertional chest pain, orthopnea PND, or leg swelling Pt denies any increase in rescue therapy over baseline, denies waking up needing it or having any early am or nocturnal exacerbations of coughing/wheezing/or dyspnea. Pt also denies any obvious fluctuation in symptoms with  weather or environmental change or other alleviating or aggravating factors  01/04/2012 Pt suddenly worse since last ov, insideous.  Notes more dyspnea, not able to do any activity. No chest pain. No real cough.  No mucus.  No edema in feet.  No arthritis.  Notes qhs dyspnea.   No f/c/s.  Now Binlista for lupus for months.  Notes more heartburn recently esp at night.  01/11/2012 Pt seen one week ago for PNA.  No real change after 10days of Avelox Sats 80% on arrival.  Pt feels the same from one week ago.  No real cough.  No chest pain or fever. Pt notes pndrip.  No leg pain.  No heartburn at night now.  Notes some QHS dyspnea and awakens in a panic state.  Pt notes severe DOE  01/29/2012 Pt worse since off prednisone.   Notes more weakness. Notes dry cough. Nose is irritated from oxygen No chest pain.   No f/c/s.   >>pred burst.   01/13/13 Follow up  Pt returns for follow up , accompanied by daughter.  Requests Oxygen be discontinued as she is no longer using . Ambulatory sats in office without desats.  Says that her breathing has been doing well with no flare in cough. CXR last month showed moderate vascular congestion without overt  failure .  Denies cough, fever, orthopnea or edema.   Recently admitted with reoccurence of endocarditis. Discharged on long term antibiotics-has PICC line.  Followed by ID clinic and cards.   Remains on Prednisone 5mg  daily and Plaquenil for SLE  On coumadin with coumadin clinic following.   02/23/2013 Chief Complaint  Patient presents with  . Follow-up    needs o2 recert - using o2 3 lpm qhs.  Beathing doing well overall.  No SOB, wheezing, chest tightness/pain, or cough at this time.  Pt in hosp three times first time 01/2012, 06/2011 and again 12/2012 .  Now just finished IV ABX and PICC line out. Enterococcus ,  MV and prosthetic AV.  Not a candidate for MV or AVR .  On po amoxil for life  Now at baseline. On 74L qhs .  Off oxygen daytime.  Dyspnea is at baseline.  No real cough.  Pt Cardiology MD is [name]  No blood in stools   This patient continues to use and benefit from her oxygen therapy and has re qualified at this visit for continued oxygen therapy    Past Medical History  Diagnosis Date  . Coronary artery disease   . Atrial fibrillation     Now NSR  . GERD (gastroesophageal reflux disease)   . Hyperlipidemia   . 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  Anemia   . Angina   . Hypertension   . Blood transfusion     "related to bad case of bronchitis once; 1 yr ago given due to GIB" (12/17/2012)  . H/O hiatal hernia   . Lupus     "that's compromised her lungs somewhat" (12/17/2012)  . Insomnia   . Endocarditis   . Prosthetic valve endocarditis   . Enterococcal bacteremia   . Heart murmur   . CHF (congestive heart failure)   . Pneumonia     "once or twice" (12/17/2012)  . Chronic bronchitis     "used to get it once/yr; hasn't had it since ~ 1986" (12/17/2012)  . Shortness of breath     "all the times sometimes" (12/17/2012)  . On home oxygen therapy     "2L prn; always at night" (12/17/2012)  . Lower GI bleed ~ 01/2012  . Arthritis      "hands" (12/17/2012)  . 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 Heart disease Mother     CHF  . Mental illness Father     suicide  . Cancer Sister     pancreatic cancer  . COPD Brother      History   Social History  . Marital Status: Divorced    Spouse Name: N/A    Number of Children: N/A  . Years of Education: N/A   Occupational History  . Not on file.   Social History Main Topics  . Smoking status: Never Smoker   . Smokeless tobacco: Never Used  . Alcohol Use: No  . Drug Use: No  . Sexual Activity: No   Other Topics Concern  . Not on file   Social History Narrative  . No narrative on file     Allergies  Allergen Reactions  . Ceftriaxone     RASH BUT TOLERATED AMPICILLIN AND IMIPENEM WITHOUT PROBLEMS  . Codeine Nausea Only     Outpatient Prescriptions Prior to Visit  Medication Sig Dispense Refill  . amoxicillin (AMOXIL) 500 MG capsule Take 1 capsule (500 mg total) by mouth 2 (two) times daily.  60 capsule  11  . digoxin (LANOXIN) 0.125 MG tablet Take 0.125 mg by mouth every morning.      . furosemide (LASIX) 20 MG tablet Take 1 tablet (20 mg total) by mouth daily.  30 tablet  0  . gabapentin (NEURONTIN) 300 MG capsule Take 1 capsule (300 mg total) by mouth at bedtime.  30 capsule  3  . hydroxychloroquine (PLAQUENIL) 200 MG tablet Take 200 mg by mouth 2 (two) times daily.       . IRON PO Take 1 tablet by mouth 2 (two) times daily.      . metoprolol (LOPRESSOR) 50 MG tablet Take 0.5 tablets (25 mg total) by mouth 2 (two) times daily.  60 tablet  3  . pantoprazole (PROTONIX) 40 MG tablet Take 40 mg by mouth every morning.      . potassium chloride SA (K-DUR,KLOR-CON) 20 MEQ tablet Take 40-60 mEq by mouth 2 (two) times daily. Takes 2 tablets every morning and takes 3 tablets every evening.      . predniSONE (DELTASONE) 5 MG tablet Take 1 tablet (5 mg total) by mouth every morning.  30  tablet  2  . rosuvastatin (CRESTOR) 10 MG tablet Take 10 mg by mouth every morning.       Marland Kitchen  sulfaSALAzine (AZULFIDINE) 500 MG tablet Take 1,000 mg by mouth 2 (two) times daily.       . traZODone (DESYREL) 50 MG tablet Take 1 tablet (50 mg total) by mouth at bedtime as needed for sleep.  20 tablet  0  . warfarin (COUMADIN) 3 MG tablet Take 3 mg by mouth every morning.       No facility-administered medications prior to visit.      Review of Systems  Constitutional:   No  weight loss, night sweats,  Fevers, chills,  +fatigue, lassitude. HEENT:   No headaches,  Difficulty swallowing,  Tooth/dental problems,  Sore throat,                No sneezing, itching, ear ache, nasal congestion, + post nasal drip,   CV:  No chest pain,  Orthopnea, PND, swelling in lower extremities, anasarca, dizziness, palpitations  GI  No heartburn, indigestion, abdominal pain, nausea, vomiting, diarrhea, change in bowel habits, loss of appetite  Resp:   No coughing up of blood.  No chest wall deformity  Skin: no rash or lesions.  GU: no dysuria, change in color of urine, no urgency or frequency.  No flank pain.  MS:  No joint pain or swelling.  No decreased range of motion.  No back pain.  Psych:  No change in mood or affect. No depression or anxiety.  No memory loss.     Objective:   Physical Exam  Filed Vitals:   02/23/13 1130  BP: 112/80  Pulse: 81  Temp: 98 F (36.7 C)  TempSrc: Oral  Height: 5\' 7"  (1.702 m)  Weight: 154 lb 12.8 oz (70.217 kg)  SpO2: 97%    Gen: Pleasant, elderly, frail , in no distress,  normal affect  ENT: No lesions,  mouth clear,  oropharynx clear, no postnasal drip  Neck: No JVD, no TMG, no carotid bruits  Lungs: No use of accessory muscles, no dullness to percussion,dry rales    Cardiovascular: RRR, heart sounds normal, no murmur or gallops, no peripheral edema, PICC line has been removed   Abdomen: soft and NT, no HSM,  BS normal  Musculoskeletal: No  deformities, no cyanosis or clubbing  Neuro: alert, non focal  Skin: Warm, no lesions or rashes     Assessment & Plan:   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 The patient continues therapy per rheumatology The patient continues to have hypoxemia nighttime only but has improved as far as daytime saturations are concerned No previous history of enterococcal endocarditis now under therapy per infectious diseases was lifelong oral antibiotic suppressive therapy I agree this patient is not a candidate for surgical intervention Plan This patient continues to use and benefit from her oxygen therapy and has re qualified at this visit for continued oxygen therapy Cont nocturnal oxygen therapy Cont prednisone per rheum     Updated Medication List Outpatient Encounter Prescriptions as of 02/23/2013  Medication Sig  . amoxicillin (AMOXIL) 500 MG capsule Take 1 capsule (500 mg total) by mouth 2 (two) times daily.  . digoxin (LANOXIN) 0.125 MG tablet Take 0.125 mg by mouth every morning.  . furosemide (LASIX) 20 MG tablet Take 1 tablet (20 mg total) by mouth daily.  Marland Kitchen gabapentin (NEURONTIN) 300 MG capsule Take 1 capsule (300 mg total) by mouth at bedtime.  . hydroxychloroquine (PLAQUENIL) 200 MG tablet Take 200 mg by mouth 2 (two) times daily.   . IRON PO  Take 1 tablet by mouth 2 (two) times daily.  . metoprolol (LOPRESSOR) 50 MG tablet Take 0.5 tablets (25 mg total) by mouth 2 (two) times daily.  . pantoprazole (PROTONIX) 40 MG tablet Take 40 mg by mouth every morning.  . potassium chloride SA (K-DUR,KLOR-CON) 20 MEQ tablet Take 40-60 mEq by mouth 2 (two) times daily. Takes 2 tablets every morning and takes 3 tablets every evening.  . predniSONE (DELTASONE) 5 MG tablet Take 1 tablet (5 mg total) by mouth every morning.  . rosuvastatin (CRESTOR) 10 MG tablet Take 10 mg by mouth every morning.   . sulfaSALAzine  (AZULFIDINE) 500 MG tablet Take 1,000 mg by mouth 2 (two) times daily.   . traZODone (DESYREL) 50 MG tablet Take 1 tablet (50 mg total) by mouth at bedtime as needed for sleep.  Marland Kitchen warfarin (COUMADIN) 4 MG tablet Take 4 mg by mouth as directed.  . [DISCONTINUED] warfarin (COUMADIN) 3 MG tablet Take 3 mg by mouth every morning.

## 2013-02-23 NOTE — Patient Instructions (Signed)
No change in medications I will have [facility] pick up portable oxygen system Return 6 months

## 2013-02-25 ENCOUNTER — Encounter: Payer: Self-pay | Admitting: Adult Health

## 2013-03-02 ENCOUNTER — Ambulatory Visit (INDEPENDENT_AMBULATORY_CARE_PROVIDER_SITE_OTHER): Payer: Medicare Other | Admitting: Infectious Disease

## 2013-03-02 VITALS — BP 137/78 | HR 72 | Temp 97.7°F

## 2013-03-02 DIAGNOSIS — B952 Enterococcus as the cause of diseases classified elsewhere: Secondary | ICD-10-CM

## 2013-03-02 DIAGNOSIS — T889XXS Complication of surgical and medical care, unspecified, sequela: Secondary | ICD-10-CM

## 2013-03-02 DIAGNOSIS — D649 Anemia, unspecified: Secondary | ICD-10-CM

## 2013-03-02 DIAGNOSIS — N289 Disorder of kidney and ureter, unspecified: Secondary | ICD-10-CM

## 2013-03-02 DIAGNOSIS — T826XXS Infection and inflammatory reaction due to cardiac valve prosthesis, sequela: Secondary | ICD-10-CM

## 2013-03-02 DIAGNOSIS — R7881 Bacteremia: Secondary | ICD-10-CM

## 2013-03-02 LAB — BASIC METABOLIC PANEL WITH GFR
GFR, Est African American: 35 mL/min — ABNORMAL LOW
GFR, Est Non African American: 30 mL/min — ABNORMAL LOW
Potassium: 4.2 mEq/L (ref 3.5–5.3)
Sodium: 138 mEq/L (ref 135–145)

## 2013-03-03 ENCOUNTER — Encounter: Payer: Self-pay | Admitting: Infectious Disease

## 2013-03-03 LAB — CBC WITH DIFFERENTIAL/PLATELET
Basophils Absolute: 0 10*3/uL (ref 0.0–0.1)
Basophils Relative: 1 % (ref 0–1)
Eosinophils Absolute: 0.1 10*3/uL (ref 0.0–0.7)
Eosinophils Relative: 2 % (ref 0–5)
HCT: 30.2 % — ABNORMAL LOW (ref 36.0–46.0)
Hemoglobin: 10.3 g/dL — ABNORMAL LOW (ref 12.0–15.0)
Lymphocytes Relative: 19 % (ref 12–46)
Lymphs Abs: 0.9 10*3/uL (ref 0.7–4.0)
MCH: 31.7 pg (ref 26.0–34.0)
MCHC: 34.1 g/dL (ref 30.0–36.0)
MCV: 92.9 fL (ref 78.0–100.0)
Monocytes Absolute: 0.6 10*3/uL (ref 0.1–1.0)
Monocytes Relative: 12 % (ref 3–12)
Neutro Abs: 3.4 10*3/uL (ref 1.7–7.7)
Neutrophils Relative %: 66 % (ref 43–77)
Platelets: 128 10*3/uL — ABNORMAL LOW (ref 150–400)
RBC: 3.25 MIL/uL — ABNORMAL LOW (ref 3.87–5.11)
RDW: 14.8 % (ref 11.5–15.5)
WBC: 5.1 10*3/uL (ref 4.0–10.5)

## 2013-03-03 LAB — SEDIMENTATION RATE: Sed Rate: 35 mm/hr — ABNORMAL HIGH (ref 0–22)

## 2013-03-03 NOTE — Progress Notes (Signed)
     [facility]   HPI  72 y.o. female with HER SECOND recurrence of enterococcal bacteremia with mitral valve  prosthetic aortic valve endocarditis who has now finished another course of IV antibiotics this time with IV AMP and GENT.  She was placed on suppressive oral amoxicillin and has so far had no evidence of relapse      Review of Systems  Constitutional: Negative for fever, chills, diaphoresis, activity change, appetite change, fatigue and unexpected weight change.  HENT: Negative for congestion, rhinorrhea, sinus pressure, sneezing, sore throat and trouble swallowing.   Eyes: Negative for photophobia and visual disturbance.  Respiratory: Negative for cough, chest tightness, shortness of breath, wheezing and stridor.   Cardiovascular: Negative for chest pain, palpitations and leg swelling.  Gastrointestinal: Negative for nausea, vomiting, abdominal pain, diarrhea, constipation, blood in stool, abdominal distention and anal bleeding.  Genitourinary: Negative for dysuria, hematuria, flank pain and difficulty urinating.  Musculoskeletal: Negative for arthralgias, back pain, gait problem, joint swelling and myalgias.  Skin: Negative for color change, pallor, rash and wound.  Neurological: Negative for dizziness, tremors, weakness and light-headedness.  Hematological: Negative for adenopathy. Does not bruise/bleed easily.  Psychiatric/Behavioral: Negative for behavioral problems, confusion, sleep disturbance, dysphoric mood, decreased concentration and agitation.     Physical Exam  Constitutional: She is oriented to person, place, and time. No distress.  HENT:  Head: Normocephalic and atraumatic.  Mouth/Throat: Oropharynx is clear and moist.  Eyes: EOM are normal.  Neck: Normal range of motion. Neck supple.  Cardiovascular: Normal rate and regular rhythm.  Exam reveals no gallop and no friction rub.   Murmur heard.  Systolic murmur is present with  a grade of 2/6  Pulmonary/Chest: Effort normal and breath sounds normal. No respiratory distress. She has no wheezes. She has no rales. She exhibits no tenderness.  Abdominal: She exhibits no distension and no mass. There is no tenderness. There is no rebound and no guarding.  Musculoskeletal: She exhibits no edema and no tenderness.  Neurological: She is alert and oriented to person, place, and time. She exhibits normal muscle tone. Coordination normal.  Skin: Skin is warm and dry. She is not diaphoretic. No erythema. No pallor.  Psychiatric: She has a normal mood and affect. Her behavior is normal. Judgment and thought content normal.    Assessment/Plan: [name] is a 72 y.o. female with a SECOND recurrence of enterococcal bacteremia with mitral valve and now prosthetic aortic valve endocarditis sp THIRD course of iv abx now with amp and gent now on suppressive oral amoxicillin  #1Recurrent ENterococcal prosthetic and native valve endocarditis: sp 3rd course of IV abx now on oral amoxcillin.   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 Recheck BMP today and consider increase amox to TID

## 2013-03-06 ENCOUNTER — Encounter: Payer: Self-pay | Admitting: Infectious Disease

## 2013-03-10 ENCOUNTER — Encounter: Payer: Self-pay | Admitting: Infectious Disease

## 2013-04-20 ENCOUNTER — Ambulatory Visit: Payer: Medicare Other | Admitting: Internal Medicine

## 2013-04-27 ENCOUNTER — Encounter: Payer: Self-pay | Admitting: Internal Medicine

## 2013-04-27 ENCOUNTER — Ambulatory Visit (INDEPENDENT_AMBULATORY_CARE_PROVIDER_SITE_OTHER): Payer: Medicare Other | Admitting: Internal Medicine

## 2013-04-27 VITALS — BP 130/72 | HR 60 | Temp 98.4°F | Resp 18 | Wt 151.8 lb

## 2013-04-27 DIAGNOSIS — D649 Anemia, unspecified: Secondary | ICD-10-CM

## 2013-04-27 DIAGNOSIS — M545 Low back pain, unspecified: Secondary | ICD-10-CM

## 2013-04-27 DIAGNOSIS — M359 Systemic involvement of connective tissue, unspecified: Secondary | ICD-10-CM

## 2013-04-27 DIAGNOSIS — I2510 Atherosclerotic heart disease of native coronary artery without angina pectoris: Secondary | ICD-10-CM

## 2013-04-27 DIAGNOSIS — I4891 Unspecified atrial fibrillation: Secondary | ICD-10-CM

## 2013-04-27 DIAGNOSIS — I38 Endocarditis, valve unspecified: Secondary | ICD-10-CM

## 2013-04-27 DIAGNOSIS — J8410 Pulmonary fibrosis, unspecified: Secondary | ICD-10-CM

## 2013-04-27 NOTE — Progress Notes (Signed)
Patient ID: GENAVIE BOETTGER, female   DOB: Apr 01, 1940, 73 y.o.   MRN: [id number]   Location:  [facility]  Allergies  Allergen Reactions  . Ceftriaxone     RASH BUT TOLERATED AMPICILLIN AND IMIPENEM WITHOUT PROBLEMS  . Codeine Nausea Only    Chief Complaint  Patient presents with  . Medical Managment of Chronic Issues    3 month f/u     HPI: Patient is a 73 y.o. white female seen in the office today for medical mgt of chronic diseases.    Now has UTI--on abx for it.  Feels ok.  Just happened to be tested and it was found.  This was a nephrologist that found it (Dr. Moshe Cipro).  Has not progressed to endocarditis at this point.  Most days eats well.  Her back hurts her almost all of the time.  Went to chiropractor in late nov/early dec. and was very sore for a week afterward.  Dr. Terrence Dupont sent in something for pain when called here and didn't get message back.  Lower back pain.  Hurts sitting and standing.  Gets some relief when laying down.  Is at waist on right hand side.  No numbness, tingling or burning down her leg.  Uses heat sometimes which hasn't helped much.  Is a little better than it had been, but has been spending a lot of time in bed.  Takes some tylenol, but not helpful.  Had xrays at chiropractor, but has not had w/in the system.    Dosage of coumadin had to be increased slightly by 0.5mg  two times weekly to keep her therapeutic.    Breathing is pretty good.  Not on O2.  Occasionally uses some at night.    Mood is fine she says.  Her daughter, [name], says memory is acting funny--forgets what she is doing.  These are more frequent than before.  She admits she has more trouble.  Depends on her dtr to keep track of appts.  Forgot how to make a taco and work the fridge, but then will pass.    [place] a couple of days ago.  Sat down at night and they aren't sure what happened--she got up to go to the bathroom, got turned around w/o the  light on.    Review of Systems:  Review of Systems  Constitutional: Positive for malaise/fatigue. Negative for fever and chills.       Better lately  HENT: Negative for congestion.   Eyes: Negative for blurred vision.  Respiratory: Positive for shortness of breath.        Unchanged  Cardiovascular: Negative for chest pain, palpitations and leg swelling.  Gastrointestinal: Negative for abdominal pain, constipation, blood in stool and melena.  Genitourinary: Negative for dysuria.  Musculoskeletal: Positive for back pain and falls. Negative for joint pain and myalgias.  Skin: Negative for rash.  Neurological: Positive for weakness. Negative for dizziness and loss of consciousness.  Psychiatric/Behavioral: Positive for memory loss. Negative for depression.    Past Medical History  Diagnosis Date  . Coronary artery disease   . Atrial fibrillation     Now NSR  . GERD (gastroesophageal reflux disease)   . Hyperlipidemia   . 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 Anemia   . Angina   . Hypertension   . Blood transfusion     "related to bad case of bronchitis once; 1 yr ago given  due to GIB" (12/17/2012)  . H/O hiatal hernia   . Lupus     "that's compromised her lungs somewhat" (12/17/2012)  . Insomnia   . Endocarditis   . Prosthetic valve endocarditis   . Enterococcal bacteremia   . Heart murmur   . CHF (congestive heart failure)   . Pneumonia     "once or twice" (12/17/2012)  . Chronic bronchitis     "used to get it once/yr; hasn't had it since ~ 1986" (12/17/2012)  . Shortness of breath     "all the times sometimes" (12/17/2012)  . On home oxygen therapy     "2L prn; always at night" (12/17/2012)  . Lower GI bleed ~ 01/2012  . Arthritis     "hands" (12/17/2012)  . 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 Givens capsule study  04/09/2011    Procedure: GIVENS CAPSULE STUDY;   Surgeon: Beryle Beams, MD;  Location: [place];  Service: Endoscopy;  Laterality: N/A;  . Tee without cardioversion  02/06/2012    Procedure: TRANSESOPHAGEAL ECHOCARDIOGRAM (TEE);  Surgeon: Birdie Riddle, MD;  Location: [facility] ENDOSCOPY;  Service: Cardiovascular;  Laterality: N/A;  . Esophagogastroduodenoscopy  02/12/2012    Procedure: ESOPHAGOGASTRODUODENOSCOPY (EGD);  Surgeon: Beryle Beams, MD;  Location: [facility] ENDOSCOPY;  Service: Endoscopy;  Laterality: N/A;  . Tee without cardioversion N/A 07/15/2012    Procedure: TRANSESOPHAGEAL ECHOCARDIOGRAM (TEE);  Surgeon: Birdie Riddle, MD;  Location: [facility] ENDOSCOPY;  Service: Cardiovascular;  Laterality: N/A;  . Breast lumpectomy Left 1977    "benign" (12/17/2012)  . Tonsillectomy  1940's  . Coronary artery bypass graft  2006  . 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   . Cardiac catheterization      "probably 2-3" (12/17/2012)  . Coronary angioplasty with stent placement      "several put in over the years" (12/17/2012)  . Cataract extraction w/ intraocular lens  implant, bilateral Bilateral 11/2000  . Peripherally inserted central catheter insertion Right 12/15/2012    "upper arm" (12/17/2012    Social History:   reports that she has never smoked. She has never used smokeless tobacco. She reports that she does not drink alcohol or use illicit drugs.  Family History  Problem Relation Age of Onset  . Heart disease Mother     CHF  . Mental illness Father     suicide  . Cancer Sister     pancreatic cancer  . COPD Brother     Medications: Patient's Medications  New Prescriptions   No medications on file  Previous Medications   AMOXICILLIN (AMOXIL) 500 MG CAPSULE    Take 1 capsule (500 mg total) by mouth 2 (two) times daily.   DIGOXIN (LANOXIN) 0.125 MG TABLET    Take 0.125 mg by mouth every morning.   FUROSEMIDE (LASIX) 20 MG TABLET    Take 1 tablet (20 mg total) by mouth daily.   GABAPENTIN (NEURONTIN)  300 MG CAPSULE    Take 1 capsule (300 mg total) by mouth at bedtime.   HYDROXYCHLOROQUINE (PLAQUENIL) 200 MG TABLET    Take 200 mg by mouth 2 (two) times daily.    IRON PO    Take 1 tablet by mouth 2 (two) times daily.   METOPROLOL (LOPRESSOR) 50 MG TABLET    Take 0.5 tablets (25 mg total) by mouth 2 (two) times daily.  PANTOPRAZOLE (PROTONIX) 40 MG TABLET    Take 40 mg by mouth every morning.   POTASSIUM CHLORIDE SA (K-DUR,KLOR-CON) 20 MEQ TABLET    Take 40-60 mEq by mouth 2 (two) times daily. Takes 2 tablets every morning and takes 3 tablets every evening.   PREDNISONE (DELTASONE) 5 MG TABLET    Take 1 tablet (5 mg total) by mouth every morning.   ROSUVASTATIN (CRESTOR) 10 MG TABLET    Take 10 mg by mouth every morning.    SULFASALAZINE (AZULFIDINE) 500 MG TABLET    Take 1,000 mg by mouth 2 (two) times daily.    TRAZODONE (DESYREL) 50 MG TABLET    Take 1 tablet (50 mg total) by mouth at bedtime as needed for sleep.   WARFARIN (COUMADIN) 4 MG TABLET    Take 4 mg by mouth as directed. Weds. & Sunday 6 mg, other days 4 mg tab  Modified Medications   No medications on file  Discontinued Medications   No medications on file     Physical Exam: Filed Vitals:   04/27/13 0952  BP: 130/72  Pulse: 60  Temp: 98.4 F (36.9 C)  TempSrc: Oral  Resp: 18  Weight: 151 lb 12.8 oz (68.856 kg)  Physical Exam  Constitutional: She is oriented to person, place, and time. No distress.  HENT:  Head: Normocephalic and atraumatic.  Cardiovascular:  irreg irreg with systolic murmur  Pulmonary/Chest: Effort normal and breath sounds normal. No respiratory distress.  Abdominal: Soft. Bowel sounds are normal. She exhibits no distension and no mass. There is no tenderness.  Musculoskeletal: Normal range of motion. She exhibits no edema and no tenderness.  No localized tenderness of lumbar spine, paravertebral muscles or buttocks; walks w/o assistive device  Neurological: She is alert and oriented to person,  place, and time. No cranial nerve deficit.  Skin: Skin is warm and dry. There is pallor.  Psychiatric: She has a normal mood and affect.    Labs reviewed: Basic Metabolic Panel:  Recent Labs  07/15/12 0420 07/16/12 2114  12/19/12 1045 02/02/13 1645 03/02/13 1637  NA 131* 132*  < > 136 137 138  K 3.8 4.3  < > 4.0 3.4* 4.2  CL 97 96  < > 104 101 103  CO2 27 25  < > 20 23 24   GLUCOSE 98 110*  < > 115* 110* 116*  BUN 22 20  < > 17 21 27*  CREATININE 1.11* 1.19*  < > 1.08 1.61* 1.65*  CALCIUM 8.9 8.8  < > 8.0* 8.4 9.0  PHOS  --  3.1  --   --   --   --   < > = values in this interval not displayed. Liver Function Tests:  Recent Labs  12/11/12 1428 12/12/12 1357 12/16/12 0845  AST 13 14 21   ALT 8 5 6   ALKPHOS 98 116 112  BILITOT 0.8 0.5 0.6  PROT 7.2 7.5 6.9  ALBUMIN 3.3* 3.1* 2.5*    Recent Labs  06/29/12 1724 07/11/12 1901 12/16/12 0845  LIPASE 72* 49 64*  CBC:  Recent Labs  12/11/12 1428  12/16/12 0845 12/18/12 0525 12/19/12 1045 12/20/12 1115 03/02/13 1637  WBC 7.5  < > 8.8 4.7 5.2  --  5.1  NEUTROABS 5.3  --  6.7  --   --   --  3.4  HGB 10.9*  < > 8.9* 7.9* 8.8* 8.4* 10.3*  HCT 31.6*  < > 26.5* 23.9* 26.2* 24.9* 30.2*  MCV 88.3  < >  90.1 91.2 90.7  --  92.9  PLT 148*  < > 131* 115* 143*  --  128*  < > = values in this interval not displayed.  Assessment/Plan 1. Low back pain -likely degenerative disc disease -encouraged frequent positional changes -may use tylenol - obtain back xrays to further evaluate:  DG Lumbar Spine 2-3 Views; Future  2. A-fib -stable, f/u labs esp dig level due to increased confusion recently - Basic metabolic panel - Digoxin level  3. Coronary atherosclerosis of unspecified type of vessel, native or graft - has not had recent lipid panel that I have access to so will check one as fasting today - Lipid panel  4. Postinflammatory pulmonary fibrosis -stable, not requiring O2 except at rest lately  5. Normocytic  anemia -f/u blood counts - CBC with Differential  6. Unspecified diffuse connective tissue disease -has lupus diagnosis--being followed by rheum and nephrology due to proteinuria  7.  Endocarditis:  On amoxicillin indefinitely as prophylaxis -also on another abx from nephrology for UTI at this time but unsure what it is  Labs/tests ordered: Orders Placed This Encounter  Procedures  . DG Lumbar Spine 2-3 Views    Standing Status: Future     Number of Occurrences:      Standing Expiration Date: 06/26/2014    Order Specific Question:  Reason for Exam (SYMPTOM  OR DIAGNOSIS REQUIRED)    Answer:  low back pain worse on standing and sitting, relieved when supine    Order Specific Question:  Preferred imaging location?    Answer:  [facility]  . CBC with Differential  . Basic metabolic panel  . Digoxin level  . Lipid panel    Next appt:  52mos

## 2013-04-28 LAB — CBC WITH DIFFERENTIAL/PLATELET
Basophils Absolute: 0.1 10*3/uL (ref 0.0–0.2)
Basos: 1 %
Eos: 4 %
Eosinophils Absolute: 0.2 10*3/uL (ref 0.0–0.4)
HCT: 35.2 % (ref 34.0–46.6)
Hemoglobin: 11.9 g/dL (ref 11.1–15.9)
Immature Grans (Abs): 0 10*3/uL (ref 0.0–0.1)
Immature Granulocytes: 0 %
Lymphocytes Absolute: 1.1 10*3/uL (ref 0.7–3.1)
Lymphs: 29 %
MCH: 31.9 pg (ref 26.6–33.0)
MCHC: 33.8 g/dL (ref 31.5–35.7)
MCV: 94 fL (ref 79–97)
Monocytes Absolute: 0.5 10*3/uL (ref 0.1–0.9)
Monocytes: 15 %
Neutrophils Absolute: 1.8 10*3/uL (ref 1.4–7.0)
Neutrophils Relative %: 51 %
RBC: 3.73 x10E6/uL — ABNORMAL LOW (ref 3.77–5.28)
RDW: 15.6 % — ABNORMAL HIGH (ref 12.3–15.4)
WBC: 3.6 10*3/uL (ref 3.4–10.8)

## 2013-04-28 LAB — BASIC METABOLIC PANEL
BUN/Creatinine Ratio: 13 (ref 11–26)
BUN: 17 mg/dL (ref 8–27)
CO2: 22 mmol/L (ref 18–29)
Calcium: 8.9 mg/dL (ref 8.7–10.3)
Chloride: 100 mmol/L (ref 97–108)
Creatinine, Ser: 1.28 mg/dL — ABNORMAL HIGH (ref 0.57–1.00)
GFR calc Af Amer: 48 mL/min/{1.73_m2} — ABNORMAL LOW (ref >59)
GFR calc non Af Amer: 41 mL/min/{1.73_m2} — ABNORMAL LOW (ref >59)
Glucose: 86 mg/dL (ref 65–99)
Potassium: 4.5 mmol/L (ref 3.5–5.2)
Sodium: 137 mmol/L (ref 134–144)

## 2013-04-28 LAB — LIPID PANEL
Chol/HDL Ratio: 2.7 ratio units (ref 0.0–4.4)
Cholesterol, Total: 122 mg/dL (ref 100–199)
HDL: 46 mg/dL (ref >39)
LDL Calculated: 64 mg/dL (ref 0–99)
Triglycerides: 60 mg/dL (ref 0–149)
VLDL Cholesterol Cal: 12 mg/dL (ref 5–40)

## 2013-04-28 LAB — DIGOXIN LEVEL: Digoxin Level: 0.5 ng/mL — ABNORMAL LOW (ref 0.9–2.0)

## 2013-05-01 ENCOUNTER — Ambulatory Visit
Admission: RE | Admit: 2013-05-01 | Discharge: 2013-05-01 | Disposition: A | Discharge status: Home / Self Care | Payer: Medicare Other | Source: Ambulatory Visit | Attending: Internal Medicine | Admitting: Internal Medicine

## 2013-05-01 ENCOUNTER — Other Ambulatory Visit: Payer: Self-pay | Admitting: Critical Care Medicine

## 2013-05-01 DIAGNOSIS — M545 Low back pain, unspecified: Secondary | ICD-10-CM

## 2013-05-22 ENCOUNTER — Other Ambulatory Visit: Payer: Self-pay

## 2013-05-22 ENCOUNTER — Other Ambulatory Visit (HOSPITAL_COMMUNITY): Payer: Self-pay | Admitting: Internal Medicine

## 2013-05-22 ENCOUNTER — Other Ambulatory Visit: Payer: Self-pay | Admitting: Internal Medicine

## 2013-05-22 NOTE — Telephone Encounter (Signed)
Daughter or patient calling c/o Mother is starting to have similar symptoms that occur when her infection has returned. She has appointment scheduled with Dr Tommy Medal for June 01, 2013 but would like to have labs done prior to visit to confirm infection and does not think her Mother can wait that long.  She would like to bring her for blood cultures.  Patient was given appointment with Dr Megan Salon who has seen the patient before. He will determine at that time what labs if any would be appropriate.   Laverle Patter, RN

## 2013-05-25 ENCOUNTER — Encounter: Payer: Self-pay | Admitting: Internal Medicine

## 2013-05-25 ENCOUNTER — Ambulatory Visit (INDEPENDENT_AMBULATORY_CARE_PROVIDER_SITE_OTHER): Payer: Medicare Other | Admitting: Internal Medicine

## 2013-05-25 VITALS — BP 150/77 | HR 106 | Temp 97.7°F | Ht 67.0 in | Wt 148.0 lb

## 2013-05-25 DIAGNOSIS — R109 Unspecified abdominal pain: Secondary | ICD-10-CM | POA: Insufficient documentation

## 2013-05-25 DIAGNOSIS — R5381 Other malaise: Secondary | ICD-10-CM

## 2013-05-25 DIAGNOSIS — R10A1 Flank pain, right side: Secondary | ICD-10-CM

## 2013-05-25 DIAGNOSIS — I2510 Atherosclerotic heart disease of native coronary artery without angina pectoris: Secondary | ICD-10-CM

## 2013-05-25 DIAGNOSIS — R5383 Other fatigue: Secondary | ICD-10-CM

## 2013-05-25 DIAGNOSIS — I38 Endocarditis, valve unspecified: Secondary | ICD-10-CM

## 2013-05-25 LAB — CBC
HCT: 35 % — ABNORMAL LOW (ref 36.0–46.0)
Hemoglobin: 11.8 g/dL — ABNORMAL LOW (ref 12.0–15.0)
MCH: 31.1 pg (ref 26.0–34.0)
MCHC: 33.7 g/dL (ref 30.0–36.0)
MCV: 92.1 fL (ref 78.0–100.0)
Platelets: 138 10*3/uL — ABNORMAL LOW (ref 150–400)
RBC: 3.8 MIL/uL — ABNORMAL LOW (ref 3.87–5.11)
RDW: 15.6 % — ABNORMAL HIGH (ref 11.5–15.5)
WBC: 4.6 10*3/uL (ref 4.0–10.5)

## 2013-05-25 LAB — COMPREHENSIVE METABOLIC PANEL
ALK PHOS: 166 U/L — AB (ref 39–117)
ALT: 19 U/L (ref 0–35)
AST: 23 U/L (ref 0–37)
Albumin: 3.7 g/dL (ref 3.5–5.2)
BUN: 15 mg/dL (ref 6–23)
CO2: 26 mEq/L (ref 19–32)
Calcium: 9.2 mg/dL (ref 8.4–10.5)
Chloride: 102 mEq/L (ref 96–112)
Creat: 0.98 mg/dL (ref 0.50–1.10)
Glucose, Bld: 96 mg/dL (ref 70–99)
Potassium: 2.9 mEq/L — ABNORMAL LOW (ref 3.5–5.3)
SODIUM: 139 meq/L (ref 135–145)
TOTAL PROTEIN: 7.2 g/dL (ref 6.0–8.3)
Total Bilirubin: 1 mg/dL (ref 0.2–1.2)

## 2013-05-25 NOTE — Progress Notes (Signed)
Patient ID: [name], female   DOB: 1940/08/10, 73 y.o.   MRN: [id number]         [facility]  Patient Active Problem List   Diagnosis Date Noted  . Right flank pain 05/25/2013    Priority: High  . Fatigue 05/25/2013    Priority: High  . Endocarditis 03/05/2012    Priority: High  . CKD (chronic kidney disease) stage 3, GFR 30-59 ml/min 02/02/2013    Priority: Medium  . Protein-calorie malnutrition, severe 12/13/2012    Priority: Medium  . Unintentional weight loss 12/09/2012    Priority: Medium  . Lupus 12/17/2012  . History of non-ST elevation myocardial infarction (NSTEMI) 02/04/2012  . 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 Anemia associated with acute blood loss 04/06/2011  . 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 Atrial fibrillation 12/13/2009  . HYPERLIPIDEMIA 01/27/2007  . RESTLESS LEG SYNDROME, MILD 01/27/2007  . CORONARY ARTERY DISEASE 01/27/2007  . 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 GERD 01/27/2007  . CORONARY ARTERY BYPASS GRAFT, HX OF 01/27/2007    Patient's Medications  New Prescriptions   No medications on file  Previous Medications   AMOXICILLIN (AMOXIL) 500 MG CAPSULE    Take 1 capsule (500 mg total) by mouth 2 (two) times daily.   DIGOXIN (LANOXIN) 0.125 MG TABLET    Take 0.125 mg by mouth every morning.   FUROSEMIDE (LASIX) 20 MG TABLET    Take 1 tablet (20 mg total) by mouth daily.   GABAPENTIN (NEURONTIN) 300 MG CAPSULE    TAKE ONE CAPSULE BY MOUTH AT BEDTIME   HYDROXYCHLOROQUINE (PLAQUENIL) 200 MG TABLET    Take 200 mg by mouth 2 (two) times daily.    IRON PO    Take 1 tablet by mouth 2 (two) times daily.   METOPROLOL (LOPRESSOR) 50 MG TABLET    Take 0.5 tablets (25 mg total) by mouth 2 (two) times daily.   OXYCODONE-ACETAMINOPHEN (PERCOCET/ROXICET) 5-325 MG PER TABLET    Take by mouth every 4 (four) hours as needed for severe pain.   PANTOPRAZOLE (PROTONIX) 40 MG TABLET    Take 40 mg by  mouth every morning.   POTASSIUM CHLORIDE SA (K-DUR,KLOR-CON) 20 MEQ TABLET    Take 40-60 mEq by mouth 2 (two) times daily. Takes 2 tablets every morning and takes 3 tablets every evening.   PREDNISONE (DELTASONE) 5 MG TABLET    Take 1 tablet (5 mg total) by mouth every morning.   ROSUVASTATIN (CRESTOR) 10 MG TABLET    Take 10 mg by mouth every morning.    SULFASALAZINE (AZULFIDINE) 500 MG TABLET    Take 1,000 mg by mouth 2 (two) times daily.    TRAZODONE (DESYREL) 50 MG TABLET    TAKE 1 TABLET BY MOUTH AT BEDTIME AS NEEDED FOR SLEEP   WARFARIN (COUMADIN) 4 MG TABLET    Take 4 mg by mouth as directed. Weds. & Sunday 6 mg, other days 4 mg tab  Modified Medications   No medications on file  Discontinued Medications   PANTOPRAZOLE (PROTONIX) 40 MG TABLET    TAKE 1 TABLET BY MOUTH EVERY MORNING    Subjective: [name] is in with her daughter for a workin appointment. 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 She completed 51 days of IV abs in November and has been on longterm suppressive amoxicillin since then. Over the past few months she has developed right flank pain. It  came on gradually. It is present all of the time and is not effected by position, movement or diet. She rates the current pain at 5 but says pain usually hit 10 on a daily basis. She takes oxycodone acetomenophen 5/325 sparingly (one every few days) so she will not run out. A gallstone and some degenerative spine changes were seen on plain films 1 month ago. She has also developed extreme progressive fatigue and anorexia over the past 2 weeks and is concerned her infection is coming back.  Review of Systems: Constitutional: positive for anorexia and fatigue, negative for chills, fevers, sweats and weight loss Eyes: negative Ears, nose, mouth, throat, and face: negative Respiratory: negative Cardiovascular: negative Gastrointestinal: negative Genitourinary:negative  Past Medical History    Diagnosis Date  . Coronary artery disease   . Atrial fibrillation     Now NSR  . GERD (gastroesophageal reflux disease)   . Hyperlipidemia   . 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 Anemia   . Angina   . Hypertension   . Blood transfusion     "related to bad case of bronchitis once; 1 yr ago given due to GIB" (12/17/2012)  . H/O hiatal hernia   . Lupus     "that's compromised her lungs somewhat" (12/17/2012)  . Insomnia   . Endocarditis   . Prosthetic valve endocarditis   . Enterococcal bacteremia   . Heart murmur   . CHF (congestive heart failure)   . Pneumonia     "once or twice" (12/17/2012)  . Chronic bronchitis     "used to get it once/yr; hasn't had it since ~ 1986" (12/17/2012)  . Shortness of breath     "all the times sometimes" (12/17/2012)  . On home oxygen therapy     "2L prn; always at night" (12/17/2012)  . Lower GI bleed ~ 01/2012  . Arthritis     "hands" (12/17/2012)  . 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 Smoking status: Never Smoker   . Smokeless tobacco: Never Used  . Alcohol Use: No    Family History  Problem Relation Age of Onset  . Heart disease Mother     CHF  . Mental illness Father     suicide  . Cancer Sister     pancreatic cancer  . COPD Brother     Allergies  Allergen Reactions  . Ceftriaxone     RASH BUT TOLERATED AMPICILLIN AND IMIPENEM WITHOUT PROBLEMS  . Codeine Nausea Only    Objective: Temp: 97.7 F (36.5 C) (03/02 1508) Temp src: Oral (03/02 1508) BP: 150/77 mmHg (03/02 1508) Pulse Rate: 106 (03/02 1508)  General: looks tired. Weight is unchanged. Skin: No rash or splinter hemorrhages Lungs: clear Cor: reg S1 and S2, no murmurs Abdomen: soft and non tender. No CVA tenderness    Assessment: The cause of her new pain, fatigue and anorexia is unclear. Certainly, I will repeat BCs to make sure her enterococcal bacteremia is not  breaking through suppressive amoxicillin therapy. If cultures are negative she may need further imaging to looks for causes of her flank pain.   Plan: 1. Check CBC, CMP, Blood cultures  2. Follow up next week   Michel Bickers, MD [facility] [place] [phone] pager   [phone] cell 05/25/2013, 3:42 PM

## 2013-05-28 ENCOUNTER — Telehealth: Payer: Self-pay | Admitting: Internal Medicine

## 2013-05-28 NOTE — Telephone Encounter (Signed)
Lab Results  Component Value Date   WBC 4.6 05/25/2013   HGB 11.8* 05/25/2013   HCT 35.0* 05/25/2013   MCV 92.1 05/25/2013   PLT 138* 05/25/2013   CMP  BMET    Component Value Date/Time   NA 139 05/25/2013 1558   NA 137 04/27/2013 1101   K 2.9* 05/25/2013 1558   CL 102 05/25/2013 1558   CO2 26 05/25/2013 1558   GLUCOSE 96 05/25/2013 1558   GLUCOSE 86 04/27/2013 1101   BUN 15 05/25/2013 1558   BUN 17 04/27/2013 1101   CREATININE 0.98 05/25/2013 1558   CREATININE 1.28* 04/27/2013 1101   CALCIUM 9.2 05/25/2013 1558   GFRNONAA 41* 04/27/2013 1101   GFRAA 48* 04/27/2013 1101   Blood cultures 05/25/2013: No growth to date  I spoke with Ms. Hochberg's daughter, [name], today. Ms. Wagenaar is still feeling poorly and the primary problem is her right flank pain. I let them know that the blood cultures are negative so far. I am not convinced that she is feeling poorly because of enterococcal bacteremia. She will continue the suppressive amoxicillin therapy for now. There was an incidental finding of a low potassium. I suggested that she double up on her supplemental potassium for the next 3 days. She is scheduled to come in and see my partner or, Dr. Tommy Medal, on March 9. I think we need to pursue further diagnostic testing to find out why she is having right flank pain. I'm not certain if it is due to her gall stone. She will probably need further imaging. We can recheck her potassium level at that.

## 2013-05-31 LAB — CULTURE, BLOOD (SINGLE)
ORGANISM ID, BACTERIA: NO GROWTH
Organism ID, Bacteria: NO GROWTH

## 2013-06-01 ENCOUNTER — Ambulatory Visit (INDEPENDENT_AMBULATORY_CARE_PROVIDER_SITE_OTHER): Payer: Medicare Other | Admitting: Infectious Disease

## 2013-06-01 ENCOUNTER — Encounter: Payer: Self-pay | Admitting: Infectious Disease

## 2013-06-01 VITALS — BP 163/96 | HR 99 | Temp 97.4°F | Wt 144.0 lb

## 2013-06-01 DIAGNOSIS — R7881 Bacteremia: Secondary | ICD-10-CM

## 2013-06-01 DIAGNOSIS — T827XXA Infection and inflammatory reaction due to other cardiac and vascular devices, implants and grafts, initial encounter: Secondary | ICD-10-CM

## 2013-06-01 DIAGNOSIS — M545 Low back pain, unspecified: Secondary | ICD-10-CM

## 2013-06-01 DIAGNOSIS — F05 Delirium due to known physiological condition: Secondary | ICD-10-CM

## 2013-06-01 DIAGNOSIS — I330 Acute and subacute infective endocarditis: Secondary | ICD-10-CM

## 2013-06-01 DIAGNOSIS — I2510 Atherosclerotic heart disease of native coronary artery without angina pectoris: Secondary | ICD-10-CM

## 2013-06-01 DIAGNOSIS — T826XXA Infection and inflammatory reaction due to cardiac valve prosthesis, initial encounter: Secondary | ICD-10-CM

## 2013-06-01 DIAGNOSIS — I38 Endocarditis, valve unspecified: Secondary | ICD-10-CM

## 2013-06-01 DIAGNOSIS — B952 Enterococcus as the cause of diseases classified elsewhere: Secondary | ICD-10-CM

## 2013-06-01 NOTE — Progress Notes (Signed)
HPI  73 y.o. female with HER SECOND recurrence of enterococcal bacteremia with mitral valve  prosthetic aortic valve endocarditis who has now finished another course of IV antibiotics this time with IV AMP and GENT and  placed on suppressive oral amoxicillin and has so far had no evidence of relapse.  She had new symptoms of malaise and confusion along with severe LBP and saw my partner Dr. Megan Salon last week. Blood cultures x 2 done and were negative. CBC and CMP were unremarkable.  Less confusion but -she is also on percocet for her LBP  In the room she is visibly uncomfortable frequently standing to relieve the pain.       Review of Systems  Constitutional: Positive for activity change. Negative for fever, chills, diaphoresis, appetite change, fatigue and unexpected weight change.  HENT: Negative for congestion and dental problem.   Eyes: Negative for photophobia and visual disturbance.  Respiratory: Negative for cough.   Gastrointestinal: Negative for nausea, vomiting, abdominal pain and diarrhea.  Genitourinary: Negative for dysuria, hematuria and difficulty urinating.  Musculoskeletal: Negative for arthralgias, back pain, gait problem, joint swelling and myalgias.  Skin: Negative for color change, pallor, rash and wound.  Neurological: Positive for weakness. Negative for dizziness, tremors and light-headedness.  Hematological: Negative for adenopathy. Does not bruise/bleed easily.  Psychiatric/Behavioral: Positive for confusion. Negative for behavioral problems, sleep disturbance, dysphoric mood, decreased concentration and agitation.     Physical Exam  Constitutional: She is oriented to person, place, and time. No distress.  HENT:  Head: Normocephalic and atraumatic.  Mouth/Throat: Oropharynx is clear and moist.  Eyes: EOM are normal.  Neck: Normal range of motion. Neck supple.  Cardiovascular: Normal rate and regular rhythm.  Exam reveals no gallop and no  friction rub.   Murmur heard.  Systolic murmur is present with a grade of 2/6  Pulmonary/Chest: Effort normal and breath sounds normal. No respiratory distress. She has no wheezes. She has no rales. She exhibits no tenderness.  Abdominal: She exhibits no distension and no mass. There is no tenderness. There is no rebound and no guarding.  Musculoskeletal: She exhibits no edema and no tenderness.       Arms: Neurological: She is alert and oriented to person, place, and time. She exhibits normal muscle tone. Coordination normal.  Skin: Skin is warm and dry. She is not diaphoretic. No erythema. No pallor.  Psychiatric: She has a normal mood and affect. Her behavior is normal. Judgment and thought content normal.    Assessment/Plan:   CHRISANN MELARAGNO is a 73 y.o. female with a SECOND recurrence of enterococcal bacteremia with mitral valve and now prosthetic aortic valve endocarditis sp THIRD course of iv abx now with amp and gent now on suppressive oral amoxicillin  #1 New problem of Lower back pain excruciating, 8/10 with radiation down leg. Somewhat alleviated by percocet  --try to get MRI T and L spine with contrast to exclude diskitis from her mx episodes of bacteremia (she does have clips in her stomach from GIB--not sure if this would preclude MRI) and she has porcine heart valve (this should not be a problem) Otherwise will get CT L and T spine  I spent greater than 25 minutes with the patient including greater than 50% of time in face to face counsel of the patient and in coordination of their care.   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   --continue amoxicillin indefinitely  #3 confusion: likely related to narcotics

## 2013-06-04 ENCOUNTER — Other Ambulatory Visit: Payer: Self-pay | Admitting: Internal Medicine

## 2013-06-04 ENCOUNTER — Other Ambulatory Visit: Payer: Self-pay | Admitting: *Deleted

## 2013-06-04 DIAGNOSIS — M545 Low back pain, unspecified: Secondary | ICD-10-CM

## 2013-06-04 MED ORDER — OXYCODONE-ACETAMINOPHEN 5-325 MG PO TABS: 1.0000 | ORAL_TABLET | ORAL | Status: DC | PRN

## 2013-06-06 ENCOUNTER — Ambulatory Visit (HOSPITAL_BASED_OUTPATIENT_CLINIC_OR_DEPARTMENT_OTHER)
Admission: RE | Admit: 2013-06-06 | Discharge: 2013-06-06 | Disposition: A | Discharge status: Home / Self Care | Payer: Medicare Other | Source: Ambulatory Visit | Attending: Infectious Disease | Admitting: Infectious Disease

## 2013-06-06 DIAGNOSIS — M545 Low back pain, unspecified: Secondary | ICD-10-CM

## 2013-06-08 ENCOUNTER — Other Ambulatory Visit: Payer: Self-pay | Admitting: Internal Medicine

## 2013-06-08 ENCOUNTER — Telehealth: Payer: Self-pay | Admitting: Internal Medicine

## 2013-06-08 NOTE — Telephone Encounter (Signed)
[facility] called back and said to disregard the message

## 2013-06-09 ENCOUNTER — Ambulatory Visit (HOSPITAL_BASED_OUTPATIENT_CLINIC_OR_DEPARTMENT_OTHER)
Admission: RE | Admit: 2013-06-09 | Discharge: 2013-06-09 | Disposition: A | Discharge status: Home / Self Care | Payer: Medicare Other | Source: Ambulatory Visit | Attending: Infectious Disease | Admitting: Infectious Disease

## 2013-06-09 ENCOUNTER — Telehealth: Payer: Self-pay | Admitting: *Deleted

## 2013-06-09 DIAGNOSIS — M4120 Other idiopathic scoliosis, site unspecified: Secondary | ICD-10-CM | POA: Insufficient documentation

## 2013-06-09 DIAGNOSIS — M5126 Other intervertebral disc displacement, lumbar region: Secondary | ICD-10-CM | POA: Insufficient documentation

## 2013-06-09 DIAGNOSIS — M51379 Other intervertebral disc degeneration, lumbosacral region without mention of lumbar back pain or lower extremity pain: Secondary | ICD-10-CM | POA: Insufficient documentation

## 2013-06-09 DIAGNOSIS — M5137 Other intervertebral disc degeneration, lumbosacral region: Secondary | ICD-10-CM | POA: Insufficient documentation

## 2013-06-09 MED ORDER — GADOBENATE DIMEGLUMINE 529 MG/ML IV SOLN: 13.0000 mL | Freq: Once | INTRAVENOUS | Status: DC | PRN

## 2013-06-09 NOTE — Telephone Encounter (Signed)
MRI results do NOT show evidence of infection. She does have some narrowing of her spine--spinal stenosis but this is NOT due to an infectious process

## 2013-06-09 NOTE — Telephone Encounter (Signed)
Daughter, [name] called for MRI results, please advise. Call back # [phone] [name]

## 2013-06-09 NOTE — Telephone Encounter (Signed)
Patient's daughter, [name] notified. [name]

## 2013-06-11 ENCOUNTER — Ambulatory Visit: Payer: Medicare Other | Admitting: Internal Medicine

## 2013-06-27 ENCOUNTER — Emergency Department (HOSPITAL_COMMUNITY)
Admission: EM | Admit: 2013-06-27 | Discharge: 2013-06-27 | Disposition: A | Discharge status: Home / Self Care | Payer: Medicare Other | Attending: Emergency Medicine | Admitting: Emergency Medicine

## 2013-06-27 ENCOUNTER — Emergency Department (HOSPITAL_COMMUNITY): Payer: Medicare Other

## 2013-06-27 ENCOUNTER — Encounter (HOSPITAL_COMMUNITY): Payer: Self-pay | Admitting: Emergency Medicine

## 2013-06-27 DIAGNOSIS — S0101XA Laceration without foreign body of scalp, initial encounter: Secondary | ICD-10-CM

## 2013-06-27 DIAGNOSIS — E785 Hyperlipidemia, unspecified: Secondary | ICD-10-CM | POA: Insufficient documentation

## 2013-06-27 DIAGNOSIS — Z8739 Personal history of other diseases of the musculoskeletal system and connective tissue: Secondary | ICD-10-CM | POA: Insufficient documentation

## 2013-06-27 DIAGNOSIS — I509 Heart failure, unspecified: Secondary | ICD-10-CM | POA: Insufficient documentation

## 2013-06-27 DIAGNOSIS — W19XXXA Unspecified fall, initial encounter: Secondary | ICD-10-CM

## 2013-06-27 DIAGNOSIS — S0100XA Unspecified open wound of scalp, initial encounter: Secondary | ICD-10-CM | POA: Insufficient documentation

## 2013-06-27 DIAGNOSIS — I2510 Atherosclerotic heart disease of native coronary artery without angina pectoris: Secondary | ICD-10-CM | POA: Insufficient documentation

## 2013-06-27 DIAGNOSIS — Y9389 Activity, other specified: Secondary | ICD-10-CM | POA: Insufficient documentation

## 2013-06-27 DIAGNOSIS — I209 Angina pectoris, unspecified: Secondary | ICD-10-CM | POA: Insufficient documentation

## 2013-06-27 DIAGNOSIS — Z23 Encounter for immunization: Secondary | ICD-10-CM | POA: Insufficient documentation

## 2013-06-27 DIAGNOSIS — D649 Anemia, unspecified: Secondary | ICD-10-CM | POA: Insufficient documentation

## 2013-06-27 DIAGNOSIS — Z79899 Other long term (current) drug therapy: Secondary | ICD-10-CM | POA: Insufficient documentation

## 2013-06-27 DIAGNOSIS — Z954 Presence of other heart-valve replacement: Secondary | ICD-10-CM | POA: Insufficient documentation

## 2013-06-27 DIAGNOSIS — Z951 Presence of aortocoronary bypass graft: Secondary | ICD-10-CM | POA: Insufficient documentation

## 2013-06-27 DIAGNOSIS — I4891 Unspecified atrial fibrillation: Secondary | ICD-10-CM | POA: Insufficient documentation

## 2013-06-27 DIAGNOSIS — S0990XA Unspecified injury of head, initial encounter: Secondary | ICD-10-CM | POA: Insufficient documentation

## 2013-06-27 DIAGNOSIS — G4700 Insomnia, unspecified: Secondary | ICD-10-CM | POA: Insufficient documentation

## 2013-06-27 DIAGNOSIS — Z7901 Long term (current) use of anticoagulants: Secondary | ICD-10-CM | POA: Insufficient documentation

## 2013-06-27 DIAGNOSIS — Z9889 Other specified postprocedural states: Secondary | ICD-10-CM | POA: Insufficient documentation

## 2013-06-27 DIAGNOSIS — Z8709 Personal history of other diseases of the respiratory system: Secondary | ICD-10-CM | POA: Insufficient documentation

## 2013-06-27 DIAGNOSIS — I10 Essential (primary) hypertension: Secondary | ICD-10-CM | POA: Insufficient documentation

## 2013-06-27 DIAGNOSIS — IMO0002 Reserved for concepts with insufficient information to code with codable children: Secondary | ICD-10-CM | POA: Insufficient documentation

## 2013-06-27 DIAGNOSIS — Z87448 Personal history of other diseases of urinary system: Secondary | ICD-10-CM | POA: Insufficient documentation

## 2013-06-27 DIAGNOSIS — Z8701 Personal history of pneumonia (recurrent): Secondary | ICD-10-CM | POA: Insufficient documentation

## 2013-06-27 DIAGNOSIS — Z9981 Dependence on supplemental oxygen: Secondary | ICD-10-CM | POA: Insufficient documentation

## 2013-06-27 DIAGNOSIS — Z9861 Coronary angioplasty status: Secondary | ICD-10-CM | POA: Insufficient documentation

## 2013-06-27 DIAGNOSIS — W1809XA Striking against other object with subsequent fall, initial encounter: Secondary | ICD-10-CM | POA: Insufficient documentation

## 2013-06-27 DIAGNOSIS — K219 Gastro-esophageal reflux disease without esophagitis: Secondary | ICD-10-CM | POA: Insufficient documentation

## 2013-06-27 DIAGNOSIS — Y92009 Unspecified place in unspecified non-institutional (private) residence as the place of occurrence of the external cause: Secondary | ICD-10-CM | POA: Insufficient documentation

## 2013-06-27 DIAGNOSIS — Z8619 Personal history of other infectious and parasitic diseases: Secondary | ICD-10-CM | POA: Insufficient documentation

## 2013-06-27 MED ORDER — TETANUS-DIPHTH-ACELL PERTUSSIS 5-2.5-18.5 LF-MCG/0.5 IM SUSP
0.5000 mL | Freq: Once | INTRAMUSCULAR | Status: AC
Start: 2013-06-27 — End: 2013-06-27
  Administered 2013-06-27: 0.5 mL via INTRAMUSCULAR
  Filled 2013-06-27: qty 0.5

## 2013-06-27 NOTE — ED Notes (Addendum)
Pt wears 3L [place] O2 as needed at home. Pts O2 saturation dropped to 86% on RA. Pt placed on  at 3L. Pt also has 1.5cm laceration to the back of head. Wound cleansed and new dressing applied.

## 2013-06-27 NOTE — Discharge Instructions (Signed)
Please follow up with your primary care doctor in 5 days for suture removal. Please keep the area clean dry and covered. Please read all discharge instructions and return precautions.    Laceration Care, Adult A laceration is a cut or lesion that goes through all layers of the skin and into the tissue just beneath the skin. TREATMENT  Some lacerations may not require closure. Some lacerations may not be able to be closed due to an increased risk of infection. It is important to see your caregiver as soon as possible after an injury to minimize the risk of infection and maximize the opportunity for successful closure. If closure is appropriate, pain medicines may be given, if needed. The wound will be cleaned to help prevent infection. Your caregiver will use stitches (sutures), staples, wound glue (adhesive), or skin adhesive strips to repair the laceration. These tools bring the skin edges together to allow for faster healing and a better cosmetic outcome. However, all wounds will heal with a scar. Once the wound has healed, scarring can be minimized by covering the wound with sunscreen during the day for 1 full year. HOME CARE INSTRUCTIONS  For sutures or staples:  Keep the wound clean and dry.  If you were given a bandage (dressing), you should change it at least once a day. Also, change the dressing if it becomes wet or dirty, or as directed by your caregiver.  Wash the wound with soap and water 2 times a day. Rinse the wound off with water to remove all soap. Pat the wound dry with a clean towel.  After cleaning, apply a thin layer of the antibiotic ointment as recommended by your caregiver. This will help prevent infection and keep the dressing from sticking.  You may shower as usual after the first 24 hours. Do not soak the wound in water until the sutures are removed.  Only take over-the-counter or prescription medicines for pain, discomfort, or fever as directed by your  caregiver.  Get your sutures or staples removed as directed by your caregiver. For skin adhesive strips:  Keep the wound clean and dry.  Do not get the skin adhesive strips wet. You may bathe carefully, using caution to keep the wound dry.  If the wound gets wet, pat it dry with a clean towel.  Skin adhesive strips will fall off on their own. You may trim the strips as the wound heals. Do not remove skin adhesive strips that are still stuck to the wound. They will fall off in time. For wound adhesive:  You may briefly wet your wound in the shower or bath. Do not soak or scrub the wound. Do not swim. Avoid periods of heavy perspiration until the skin adhesive has fallen off on its own. After showering or bathing, gently pat the wound dry with a clean towel.  Do not apply liquid medicine, cream medicine, or ointment medicine to your wound while the skin adhesive is in place. This may loosen the film before your wound is healed.  If a dressing is placed over the wound, be careful not to apply tape directly over the skin adhesive. This may cause the adhesive to be pulled off before the wound is healed.  Avoid prolonged exposure to sunlight or tanning lamps while the skin adhesive is in place. Exposure to ultraviolet light in the first year will darken the scar.  The skin adhesive will usually remain in place for 5 to 10 days, then naturally fall off the  skin. Do not pick at the adhesive film. You may need a tetanus shot if:  You cannot remember when you had your last tetanus shot.  You have never had a tetanus shot. If you get a tetanus shot, your arm may swell, get red, and feel warm to the touch. This is common and not a problem. If you need a tetanus shot and you choose not to have one, there is a rare chance of getting tetanus. Sickness from tetanus can be serious. SEEK MEDICAL CARE IF:   You have redness, swelling, or increasing pain in the wound.  You see a red line that goes away  from the wound.  You have yellowish-white fluid (pus) coming from the wound.  You have a fever.  You notice a bad smell coming from the wound or dressing.  Your wound breaks open before or after sutures have been removed.  You notice something coming out of the wound such as wood or glass.  Your wound is on your hand or foot and you cannot move a finger or toe. SEEK IMMEDIATE MEDICAL CARE IF:   Your pain is not controlled with prescribed medicine.  You have severe swelling around the wound causing pain and numbness or a change in color in your arm, hand, leg, or foot.  Your wound splits open and starts bleeding.  You have worsening numbness, weakness, or loss of function of any joint around or beyond the wound.  You develop painful lumps near the wound or on the skin anywhere on your body. MAKE SURE YOU:   Understand these instructions.  Will watch your condition.  Will get help right away if you are not doing well or get worse. Document Released: 03/12/2005 Document Revised: 06/04/2011 Document Reviewed: 09/05/2010 [facility] Patient Information 2014 [place], [place].   Hematoma A hematoma is a collection of blood under the skin, in an organ, in a body space, in a joint space, or in other tissue. The blood can clot to form a lump that you can see and feel. The lump is often firm and may sometimes become sore and tender. Most hematomas get better in a few days to weeks. However, some hematomas may be serious and require medical care. Hematomas can range in size from very small to very large. CAUSES  A hematoma can be caused by a blunt or penetrating injury. It can also be caused by spontaneous leakage from a blood vessel under the skin. Spontaneous leakage from a blood vessel is more likely to occur in older people, especially those taking blood thinners. Sometimes, a hematoma can develop after certain medical procedures. SIGNS AND SYMPTOMS   A firm lump on the body.  Possible  pain and tenderness in the area.  Bruising.Blue, dark blue, purple-red, or yellowish skin may appear at the site of the hematoma if the hematoma is close to the surface of the skin. For hematomas in deeper tissues or body spaces, the signs and symptoms may be subtle. For example, an intra-abdominal hematoma may cause abdominal pain, weakness, fainting, and shortness of breath. An intracranial hematoma may cause a headache or symptoms such as weakness, trouble speaking, or a change in consciousness. DIAGNOSIS  A hematoma can usually be diagnosed based on your medical history and a physical exam. Imaging tests may be needed if your health care provider suspects a hematoma in deeper tissues or body spaces, such as the abdomen, head, or chest. These tests may include ultrasonography or a CT scan.  TREATMENT  Hematomas usually go away on their own over time. Rarely does the blood need to be drained out of the body. Large hematomas or those that may affect vital organs will sometimes need surgical drainage or monitoring. HOME CARE INSTRUCTIONS   Apply ice to the injured area:   Put ice in a plastic bag.   Place a towel between your skin and the bag.   Leave the ice on for 20 minutes, 2 3 times a day for the first 1 to 2 days.   After the first 2 days, switch to using warm compresses on the hematoma.   Elevate the injured area to help decrease pain and swelling. Wrapping the area with an elastic bandage may also be helpful. Compression helps to reduce swelling and promotes shrinking of the hematoma. Make sure the bandage is not wrapped too tight.   If your hematoma is on a lower extremity and is painful, crutches may be helpful for a couple days.   Only take over-the-counter or prescription medicines as directed by your health care provider. SEEK IMMEDIATE MEDICAL CARE IF:   You have increasing pain, or your pain is not controlled with medicine.   You have a fever.   You have  worsening swelling or discoloration.   Your skin over the hematoma breaks or starts bleeding.   Your hematoma is in your chest or abdomen and you have weakness, shortness of breath, or a change in consciousness.  Your hematoma is on your scalp (caused by a fall or injury) and you have a worsening headache or a change in alertness or consciousness. MAKE SURE YOU:   Understand these instructions.  Will watch your condition.  Will get help right away if you are not doing well or get worse. Document Released: 10/25/2003 Document Revised: 11/12/2012 Document Reviewed: 08/20/2012 [facility] Patient Information 2014 [place].

## 2013-06-27 NOTE — ED Provider Notes (Signed)
Medical screening examination/treatment/procedure(s) were conducted as a shared visit with non-physician practitioner(s) and myself.  I personally evaluated the patient during the encounter.  Elderly female with mechanical fall and small posterior scalp lac on exam. No neuro deficits. CT head reviewed no ICB. Plan lac repair and d/c home with head injury precautions  Teressa Lower, MD 06/27/13 2303

## 2013-06-27 NOTE — ED Notes (Signed)
Pt reports that she got up to go to the bathroom and on the way back miss stepped and fell. Pt hit head and has wound to the back of head. Pt takes heparin for a-fib.

## 2013-06-27 NOTE — ED Provider Notes (Signed)
CSN: [id number]     Arrival date & time 06/27/13  0602 History   First MD Initiated Contact with Patient 06/27/13 0602     Chief Complaint  Patient presents with  . Fall     (Consider location/radiation/quality/duration/timing/severity/associated sxs/prior Treatment) HPI Comments: Patient is a 73 year old female past medical history significant for CAD, A. Fib, GERD, HLD, Anemia, HTN, Lupus, Hx of endocarditis, Hx of enterococcal bacteremia, CHF, Chronic bronchitits on home 2L O2 presenting to the ED after a fall. The patient states she was getting up to use the restroom when she misstepped causing her to fall and hit the back of her head on a bedside table. Patient did not lose consciousness. She denies any precipitating chest pain, shortness of breath, lightheadedness, dizziness, headache. She denies any pain currently. Bleeding has improved. Patient is on heparin at home for A. fib. 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   Patient is a 73 y.o. female presenting with fall.  Fall Pertinent negatives include no arthralgias, chest pain, headaches, myalgias, nausea, neck pain or vomiting.    Past Medical History  Diagnosis Date  . Coronary artery disease   . Atrial fibrillation     Now NSR  . GERD (gastroesophageal reflux disease)   . Hyperlipidemia   . 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 Anemia   . Angina   . Hypertension   . Blood transfusion     "related to bad case of bronchitis once; 1 yr ago given due to GIB" (12/17/2012)  . H/O hiatal hernia   . Lupus     "that's compromised her lungs somewhat" (12/17/2012)  . Insomnia   . Endocarditis   . Prosthetic valve endocarditis   . Enterococcal bacteremia   . Heart murmur   . CHF (congestive heart failure)   . Pneumonia     "once or twice" (12/17/2012)  . Chronic bronchitis     "used to get it once/yr; hasn't had it since ~  1986" (12/17/2012)  . Shortness of breath     "all the times sometimes" (12/17/2012)  . On home oxygen therapy     "2L prn; always at night" (12/17/2012)  . Lower GI bleed ~ 01/2012  . Arthritis     "hands" (12/17/2012)  . 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 Givens capsule study  04/09/2011    Procedure: GIVENS CAPSULE STUDY;  Surgeon: Beryle Beams, MD;  Location: [place];  Service: Endoscopy;  Laterality: N/A;  . Tee without cardioversion  02/06/2012    Procedure: TRANSESOPHAGEAL ECHOCARDIOGRAM (TEE);  Surgeon: Birdie Riddle, MD;  Location: [facility] ENDOSCOPY;  Service: Cardiovascular;  Laterality: N/A;  . Esophagogastroduodenoscopy  02/12/2012    Procedure: ESOPHAGOGASTRODUODENOSCOPY (EGD);  Surgeon: Beryle Beams, MD;  Location: [facility] ENDOSCOPY;  Service: Endoscopy;  Laterality: N/A;  . Tee without cardioversion N/A 07/15/2012    Procedure: TRANSESOPHAGEAL ECHOCARDIOGRAM (TEE);  Surgeon: Birdie Riddle, MD;  Location: [facility] ENDOSCOPY;  Service: Cardiovascular;  Laterality: N/A;  . Breast lumpectomy Left 1977    "benign" (12/17/2012)  . Tonsillectomy  1940's  . Coronary artery bypass graft  2006  . 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   . Cardiac catheterization      "probably 2-3" (12/17/2012)  . Coronary angioplasty  with stent placement      "several put in over the years" (12/17/2012)  . Cataract extraction w/ intraocular lens  implant, bilateral Bilateral 11/2000  . Peripherally inserted central catheter insertion Right 12/15/2012    "upper arm" (12/17/2012   Family History  Problem Relation Age of Onset  . Heart disease Mother     CHF  . Mental illness Father     suicide  . Cancer Sister     pancreatic cancer  . COPD Brother    History  Substance Use Topics  . Smoking status: Never Smoker   . Smokeless tobacco: Never Used  . Alcohol Use: No   OB History   Grav  Para Term Preterm Abortions TAB SAB Ect Mult Living                 Review of Systems  Respiratory: Negative for shortness of breath.   Cardiovascular: Negative for chest pain and palpitations.  Gastrointestinal: Negative for nausea and vomiting.  Musculoskeletal: Negative for arthralgias, back pain, myalgias, neck pain and neck stiffness.  Skin: Positive for wound.  Neurological: Negative for dizziness, light-headedness and headaches.  All other systems reviewed and are negative.      Allergies  Ceftriaxone and Codeine  Home Medications   Current Outpatient Rx  Name  Route  Sig  Dispense  Refill  . digoxin (LANOXIN) 0.125 MG tablet   Oral   Take 0.125 mg by mouth every morning.         . furosemide (LASIX) 20 MG tablet   Oral   Take 1 tablet (20 mg total) by mouth daily.   30 tablet   0   . gabapentin (NEURONTIN) 300 MG capsule      TAKE ONE CAPSULE BY MOUTH AT BEDTIME   30 capsule   3   . hydroxychloroquine (PLAQUENIL) 200 MG tablet   Oral   Take 200 mg by mouth 2 (two) times daily.          . IRON PO   Oral   Take 1 tablet by mouth 2 (two) times daily.         . metoprolol (LOPRESSOR) 50 MG tablet   Oral   Take 0.5 tablets (25 mg total) by mouth 2 (two) times daily.   60 tablet   3   . oxyCODONE-acetaminophen (PERCOCET/ROXICET) 5-325 MG per tablet   Oral   Take 1 tablet by mouth every 4 (four) hours as needed for severe pain.   180 tablet   0   . pantoprazole (PROTONIX) 40 MG tablet   Oral   Take 40 mg by mouth every morning.         . potassium chloride SA (K-DUR,KLOR-CON) 20 MEQ tablet   Oral   Take 40-60 mEq by mouth 2 (two) times daily. Takes 2 tablets every morning and takes 3 tablets every evening.         . predniSONE (DELTASONE) 5 MG tablet   Oral   Take 1 tablet (5 mg total) by mouth every morning.   30 tablet   2   . rosuvastatin (CRESTOR) 10 MG tablet   Oral   Take 10 mg by mouth every morning.          .  sulfaSALAzine (AZULFIDINE) 500 MG tablet   Oral   Take 1,000 mg by mouth 2 (two) times daily.          Marland Kitchen warfarin (COUMADIN) 4 MG tablet   Oral  Take 4-6 mg by mouth daily. Takes 4mg  (1tablet) daily, except Wednesday and Sunday, then takes 6mg  (1 1/2 tablets)         . traZODone (DESYREL) 50 MG tablet      TAKE 1 TABLET BY MOUTH AT BEDTIME AS NEEDED FOR SLEEP   20 tablet   0    BP 160/74  Pulse 86  Temp(Src) 98.6 F (37 C) (Oral)  Resp 24  SpO2 97% Physical Exam  Nursing note and vitals reviewed. Constitutional: She is oriented to person, place, and time. She appears well-developed and well-nourished. No distress.  HENT:  Head: Normocephalic. Head is with laceration (1 cm posterior scalp).    Right Ear: External ear normal.  Left Ear: External ear normal.  Nose: Nose normal.  Mouth/Throat: Oropharynx is clear and moist. No oropharyngeal exudate.  Eyes: Conjunctivae and EOM are normal. Pupils are equal, round, and reactive to light.  Neck: Normal range of motion. Neck supple.  Cardiovascular: Normal rate, regular rhythm and intact distal pulses.   Murmur heard.  Systolic murmur is present with a grade of 2/6  Pulmonary/Chest: Effort normal and breath sounds normal. No respiratory distress.  Abdominal: Soft. There is no tenderness.  Musculoskeletal: Normal range of motion.  Neurological: She is alert and oriented to person, place, and time. She has normal strength. No cranial nerve deficit or sensory deficit. Gait normal. GCS eye subscore is 4. GCS verbal subscore is 5. GCS motor subscore is 6.  No pronator drift. Bilateral heel-knee-shin intact.  Skin: Skin is warm and dry. She is not diaphoretic.    ED Course  Procedures (including critical care time) LACERATION REPAIR Performed by: Harlow Mares Authorized by: Harlow Mares Consent: Verbal consent obtained. Risks and benefits: risks, benefits and alternatives were discussed Consent given  by: patient Patient identity confirmed: provided demographic data Prepped and Draped in normal sterile fashion Wound explored  Laceration Location: posterior scalp  Laceration Length: 1 cm  No Foreign Bodies seen or palpated  Anesthesia: NA  Local anesthetic: none  Anesthetic total: 0 ml  Irrigation method: syringe Amount of cleaning: standard  Skin closure: Staples  Number of sutures: 2  Technique: Staples  Patient tolerance: Patient tolerated the procedure well with no immediate complications.  Labs Review Labs Reviewed - No data to display Imaging Review Ct Head Wo Contrast  06/27/2013   CLINICAL DATA:  Head injury after falling.  EXAM: CT HEAD WITHOUT CONTRAST  CT CERVICAL SPINE WITHOUT CONTRAST  TECHNIQUE: Multidetector CT imaging of the head and cervical spine was performed following the standard protocol without intravenous contrast. Multiplanar CT image reconstructions of the cervical spine were also generated.  COMPARISON:  Head CT 07/11/2012.  FINDINGS: CT HEAD FINDINGS  There is no evidence of acute intracranial hemorrhage, mass lesion, brain edema or extra-axial fluid collection. Atrophy and chronic small vessel ischemic changes are stable with asymmetric encephalomalacia in the right frontoparietal lobe. There is no evidence of acute infarct or hydrocephalus.  The visualized paranasal sinuses, mastoid air cells and middle ears are clear. The calvarium is intact.  CT CERVICAL SPINE FINDINGS  There is straightening of the usual cervical lordosis. There is no focal angulation or listhesis. There is no evidence of acute fracture or traumatic subluxation. Relatively mild multilevel spondylosis is demonstrated with disc space loss greatest at C5-6 and C6-7. The facet joints appear ankylosed at C2-3.  No acute soft tissue findings are identified. Carotid arterial calcifications are present bilaterally.  IMPRESSION: 1.  Stable CT of the head. No acute intracranial or calvarial  findings. 2. No evidence of acute cervical spine fracture, traumatic subluxation or static signs of instability. Spondylosis as described.   Electronically Signed   By: Camie Patience M.D.   On: 06/27/2013 07:04   Ct Cervical Spine Wo Contrast  06/27/2013   CLINICAL DATA:  Head injury after falling.  EXAM: CT HEAD WITHOUT CONTRAST  CT CERVICAL SPINE WITHOUT CONTRAST  TECHNIQUE: Multidetector CT imaging of the head and cervical spine was performed following the standard protocol without intravenous contrast. Multiplanar CT image reconstructions of the cervical spine were also generated.  COMPARISON:  Head CT 07/11/2012.  FINDINGS: CT HEAD FINDINGS  There is no evidence of acute intracranial hemorrhage, mass lesion, brain edema or extra-axial fluid collection. Atrophy and chronic small vessel ischemic changes are stable with asymmetric encephalomalacia in the right frontoparietal lobe. There is no evidence of acute infarct or hydrocephalus.  The visualized paranasal sinuses, mastoid air cells and middle ears are clear. The calvarium is intact.  CT CERVICAL SPINE FINDINGS  There is straightening of the usual cervical lordosis. There is no focal angulation or listhesis. There is no evidence of acute fracture or traumatic subluxation. Relatively mild multilevel spondylosis is demonstrated with disc space loss greatest at C5-6 and C6-7. The facet joints appear ankylosed at C2-3.  No acute soft tissue findings are identified. Carotid arterial calcifications are present bilaterally.  IMPRESSION: 1. Stable CT of the head. No acute intracranial or calvarial findings. 2. No evidence of acute cervical spine fracture, traumatic subluxation or static signs of instability. Spondylosis as described.   Electronically Signed   By: Camie Patience M.D.   On: 06/27/2013 07:04     EKG Interpretation None      MDM   Final diagnoses:  Scalp laceration  Fall at home    Filed Vitals:   06/27/13 0750  BP: 160/74  Pulse: 86    Temp:   Resp:     Afebrile, NAD, non-toxic appearing, AAOx4. No neurofocal deficits. Small laceration noted the posterior scalp with hematoma. No other obvious skull deformities noted. Patient with mechanical sounding fall. No precipitating chest pain, shortness of breath, lightheadedness, dizziness. CT scans obtained without acute abnormality. Tdap booster given. Wound cleaning complete with pressure irrigation, bottom of wound visualized, no foreign bodies appreciated. Laceration occurred < 8 hours prior to repair which was well tolerated. Discussed suture home care w pt and answered questions. Pt to f-u for wound check and staple removal in 5 days. Pt is already on Abx by ID for prophylaxis. Pt is hemodynamically stable w no complaints prior to dc. Pt lives at home with family and they will return with patient if anything changes. Patient d/w with Dr. Marnette Burgess, agrees with plan.         Harlow Mares, PA-C 06/27/13 [id number]

## 2013-06-27 NOTE — ED Notes (Signed)
Patient transported to CT 

## 2013-07-03 ENCOUNTER — Encounter: Payer: Self-pay | Admitting: Internal Medicine

## 2013-07-03 ENCOUNTER — Ambulatory Visit (INDEPENDENT_AMBULATORY_CARE_PROVIDER_SITE_OTHER): Payer: Medicare Other | Admitting: Internal Medicine

## 2013-07-03 VITALS — BP 126/70 | HR 76 | Resp 12 | Wt 161.0 lb

## 2013-07-03 DIAGNOSIS — G2581 Restless legs syndrome: Secondary | ICD-10-CM

## 2013-07-03 DIAGNOSIS — J8410 Pulmonary fibrosis, unspecified: Secondary | ICD-10-CM

## 2013-07-03 DIAGNOSIS — I38 Endocarditis, valve unspecified: Secondary | ICD-10-CM

## 2013-07-03 DIAGNOSIS — R627 Adult failure to thrive: Secondary | ICD-10-CM

## 2013-07-03 DIAGNOSIS — M329 Systemic lupus erythematosus, unspecified: Secondary | ICD-10-CM

## 2013-07-03 DIAGNOSIS — I2510 Atherosclerotic heart disease of native coronary artery without angina pectoris: Secondary | ICD-10-CM

## 2013-07-03 DIAGNOSIS — M545 Low back pain, unspecified: Secondary | ICD-10-CM | POA: Insufficient documentation

## 2013-07-03 MED ORDER — BUPRENORPHINE 5 MCG/HR TD PTWK: 5.0000 ug | MEDICATED_PATCH | TRANSDERMAL | Status: DC

## 2013-07-03 NOTE — Progress Notes (Signed)
Patient ID: [name], female   DOB: 1940-08-13, 73 y.o.   MRN: [id number]   Location:  [facility]  Code Status: full code, we do not yet have copies of advance directives  Allergies  Allergen Reactions  . Ceftriaxone     RASH BUT TOLERATED AMPICILLIN AND IMIPENEM WITHOUT PROBLEMS  . Codeine Nausea Only    Chief Complaint  Patient presents with  . Suture / Staple Removal    Remove 2 staples in the back of head, placed on 06/27/13  . Leg Problem    Patient with ongoing RLS, no improvement     HPI: Patient is a 73 y.o. white female seen in the office today for ED visit after mechanical fall 06/27/13 with posterior scalp laceration--she received 2 sutures which were removed today.    She had had a misstep walking to the bathroom and hit her head on the bedside table.  Head no longer painful.  No more falls since.    Seen 05/25/13 by Dr. Gilman Buttner done and blood cultures were negative.  She was a little hypokalemic   Remains tired all of the time, breathing remains short, she ahd no energy and no appetite.  Not walking well today.  O2 script is only for at night.  Would not register on her when we checked it today.    Then Dr. Drucilla Schmidt did an MRI to r/o infection in her back.  Showed arthritis and DDD.  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  Saw Dr. Ouida Sills Monday and he tested her blood and urine.  Protein still a little high but not unusually high.  Gave her pain patch for her back pain.  It is Butrans 36mcg/hr.  Has been taking three days--has been helping her sleep.      Once in a while, a little confused, but not too bad.    Feet are a little swollen.  Has gained weight, but hardly eaten in a week.     Review of Systems:  Review of Systems  Constitutional: Positive for malaise/fatigue. Negative for fever, chills and diaphoresis.   Has gained weight but eating poorly  Eyes: Negative for blurred vision.  Respiratory: Positive for shortness of breath and wheezing.   Cardiovascular: Negative for chest pain and leg swelling.       Trace edema bilateral feet and ankles  Gastrointestinal: Negative for abdominal pain.  Genitourinary: Negative for dysuria.  Musculoskeletal: Positive for back pain and falls.       One fall as in hpi  Neurological: Positive for dizziness and weakness. Negative for loss of consciousness and headaches.  Endo/Heme/Allergies: Bruises/bleeds easily.  Psychiatric/Behavioral: Positive for memory loss.       Some confusion intermittently with pain meds, steroid, hypoxia (is on O2 at hs)    Past Medical History  Diagnosis Date  . Coronary artery disease   . Atrial fibrillation     Now NSR  . GERD (gastroesophageal reflux disease)   . Hyperlipidemia   . 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 Anemia   . Angina   . Hypertension   . Blood transfusion     "related to bad case of bronchitis once; 1 yr ago given due to GIB" (12/17/2012)  . H/O hiatal hernia   . Lupus     "that's compromised her lungs  somewhat" (12/17/2012)  . Insomnia   . Endocarditis   . Prosthetic valve endocarditis   . Enterococcal bacteremia   . Heart murmur   . CHF (congestive heart failure)   . Pneumonia     "once or twice" (12/17/2012)  . Chronic bronchitis     "used to get it once/yr; hasn't had it since ~ 1986" (12/17/2012)  . Shortness of breath     "all the times sometimes" (12/17/2012)  . On home oxygen therapy     "2L prn; always at night" (12/17/2012)  . Lower GI bleed ~ 01/2012  . Arthritis     "hands" (12/17/2012)  . 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 Givens capsule study  04/09/2011    Procedure: GIVENS CAPSULE STUDY;  Surgeon: Beryle Beams, MD;  Location: [place];  Service: Endoscopy;   Laterality: N/A;  . Tee without cardioversion  02/06/2012    Procedure: TRANSESOPHAGEAL ECHOCARDIOGRAM (TEE);  Surgeon: Birdie Riddle, MD;  Location: [facility] ENDOSCOPY;  Service: Cardiovascular;  Laterality: N/A;  . Esophagogastroduodenoscopy  02/12/2012    Procedure: ESOPHAGOGASTRODUODENOSCOPY (EGD);  Surgeon: Beryle Beams, MD;  Location: [facility] ENDOSCOPY;  Service: Endoscopy;  Laterality: N/A;  . Tee without cardioversion N/A 07/15/2012    Procedure: TRANSESOPHAGEAL ECHOCARDIOGRAM (TEE);  Surgeon: Birdie Riddle, MD;  Location: [facility] ENDOSCOPY;  Service: Cardiovascular;  Laterality: N/A;  . Breast lumpectomy Left 1977    "benign" (12/17/2012)  . Tonsillectomy  1940's  . Coronary artery bypass graft  2006  . 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   . Cardiac catheterization      "probably 2-3" (12/17/2012)  . Coronary angioplasty with stent placement      "several put in over the years" (12/17/2012)  . Cataract extraction w/ intraocular lens  implant, bilateral Bilateral 11/2000  . Peripherally inserted central catheter insertion Right 12/15/2012    "upper arm" (12/17/2012    Social History:   reports that she has never smoked. She has never used smokeless tobacco. She reports that she does not drink alcohol or use illicit drugs.  Family History  Problem Relation Age of Onset  . Heart disease Mother     CHF  . Mental illness Father     suicide  . Cancer Sister     pancreatic cancer  . COPD Brother     Medications: Patient's Medications  New Prescriptions   No medications on file  Previous Medications   DIGOXIN (LANOXIN) 0.125 MG TABLET    Take 0.125 mg by mouth every morning.   FUROSEMIDE (LASIX) 20 MG TABLET    Take 1 tablet (20 mg total) by mouth daily.   GABAPENTIN (NEURONTIN) 300 MG CAPSULE    TAKE ONE CAPSULE BY MOUTH AT BEDTIME   HYDROXYCHLOROQUINE (PLAQUENIL) 200 MG TABLET    Take 200 mg by mouth 2 (two) times daily.    IRON PO    Take 1 tablet by  mouth 2 (two) times daily.   METOPROLOL (LOPRESSOR) 50 MG TABLET    Take 0.5 tablets (25 mg total) by mouth 2 (two) times daily.   OXYCODONE-ACETAMINOPHEN (PERCOCET/ROXICET) 5-325 MG PER TABLET    Take 1 tablet by mouth every 4 (four) hours as needed for severe pain.   PANTOPRAZOLE (PROTONIX) 40 MG TABLET    Take 40 mg by mouth every morning.   POTASSIUM CHLORIDE SA (K-DUR,KLOR-CON)  20 MEQ TABLET    Take 40-60 mEq by mouth 2 (two) times daily. Takes 2 tablets every morning and takes 3 tablets every evening.   PREDNISONE (DELTASONE) 5 MG TABLET    Take 1 tablet (5 mg total) by mouth every morning.   ROSUVASTATIN (CRESTOR) 10 MG TABLET    Take 10 mg by mouth every morning.    SULFASALAZINE (AZULFIDINE) 500 MG TABLET    Take 1,000 mg by mouth 2 (two) times daily.    TRAZODONE (DESYREL) 50 MG TABLET    TAKE 1 TABLET BY MOUTH AT BEDTIME AS NEEDED FOR SLEEP   WARFARIN (COUMADIN) 4 MG TABLET    Take 4-6 mg by mouth daily. Takes 4mg  (1tablet) daily, except Wednesday and Sunday, then takes 6mg  (1 1/2 tablets)  Modified Medications   No medications on file  Discontinued Medications   No medications on file     Physical Exam: Filed Vitals:   07/03/13 1133  BP: 126/70  Pulse: 76  Resp: 12  Weight: 161 lb (73.029 kg)  Physical Exam  Constitutional: She is oriented to person, place, and time.  Increasingly frail appearing white female breathing rapidly today, very weak, needed help getting up out of chair, walking slowly  HENT:  Head: Normocephalic.  Has hematoma of left posterior scalp--sutures were removed today without complications  Eyes: EOM are normal. Pupils are equal, round, and reactive to light.  Neck: No JVD present.  Cardiovascular: Normal rate, regular rhythm and intact distal pulses.   Murmur heard. Pulmonary/Chest: Effort normal. She exhibits no tenderness.  Some scattered wheezes of lungs bilaterally, but not more than baseline  Abdominal: Soft. Bowel sounds are normal. She  exhibits no distension and no mass. There is no tenderness.  Musculoskeletal: Normal range of motion.  Walking very slowly, needed help to stand  Neurological: She is alert and oriented to person, place, and time.  Skin: There is pallor.  Slight bluish discoloration around lips     Labs reviewed: Basic Metabolic Panel:  Recent Labs  07/15/12 0420 07/16/12 2114  03/02/13 1637 04/27/13 1101 05/25/13 1558  NA 131* 132*  < > 138 137 139  K 3.8 4.3  < > 4.2 4.5 2.9*  CL 97 96  < > 103 100 102  CO2 27 25  < > 24 22 26   GLUCOSE 98 110*  < > 116* 86 96  BUN 22 20  < > 27* 17 15  CREATININE 1.11* 1.19*  < > 1.65* 1.28* 0.98  CALCIUM 8.9 8.8  < > 9.0 8.9 9.2  PHOS  --  3.1  --   --   --   --   < > = values in this interval not displayed. Liver Function Tests:  Recent Labs  12/12/12 1357 12/16/12 0845 05/25/13 1558  AST 14 21 23   ALT 5 6 19   ALKPHOS 116 112 166*  BILITOT 0.5 0.6 1.0  PROT 7.5 6.9 7.2  ALBUMIN 3.1* 2.5* 3.7    Recent Labs  07/11/12 1901 12/16/12 0845  LIPASE 49 64*  CBC:  Recent Labs  12/16/12 0845  12/19/12 1045  03/02/13 1637 04/27/13 1101 05/25/13 1558  WBC 8.8  < > 5.2  --  5.1 3.6 4.6  NEUTROABS 6.7  --   --   --  3.4 1.8  --   HGB 8.9*  < > 8.8*  < > 10.3* 11.9 11.8*  HCT 26.5*  < > 26.2*  < > 30.2* 35.2 35.0*  MCV 90.1  < > 90.7  --  92.9 94 92.1  PLT 131*  < > 143*  --  128*  --  138*  < > = values in this interval not displayed. Lipid Panel:  Recent Labs  04/27/13 1101  HDL 46  LDLCALC 64  TRIG 60  CHOLHDL 2.7  Ct brain   Assessment/Plan 1. Failure to thrive in adult -I am concerned that her po intake has remained poor for this entire past month, she looked very ill today--breathing rapidly, slightly peripherally cyanotic, but pox not detectable, felt nauseous, very weak, having more trouble walking, had fall last weekend with hematoma on left posterior scalp (sutures were removed today) -I reordered a panel of labs as her  daughter and I didn't want to miss something -pt does not really want a lot of testing--thinks she will be feeling better tomorrow, but her daughter and I convinced her to get more testing -labs one month ago were unrevealing -she has just had urine testing at her rheumatologist - CBC With differential/Platelet - Comprehensive metabolic panel - Culture, Blood - Culture, Blood - Sedimentation Rate -if this becomes her new baseline and no etiology to her decline can be found, I will need to discuss hospice with her daughter  2. Low back pain -improved with patch--seems to be sedating her a bit, but she feels better with it so will not change - buprenorphine (BUTRANS) 5 MCG/HR PTWK patch; Place 1 patch (5 mcg total) onto the skin once a week.  Dispense: 1 patch; Refill: 0  3. Endocarditis -had recurrence x 3 of prosthetic and native valves, now on ampicillin indefinitely - CBC With differential/Platelet - Comprehensive metabolic panel - Culture, Blood - Culture, Blood - Sedimentation Rate -back imaging was negative for diskitis/abscess  4. Restless legs syndrome (RLS) -remains problematic--not really improved with gabapentin -is sleeping better now since pain patch butrans was added by Dr. Ouida Sills  5. Postinflammatory pulmonary fibrosis -suspect she is hypoxic and this is part of why she feels so bad -lungs unchanged from her baseline (no asymmetry to exam) -POX could not be obtained with gel manicure in place  -no definitive s/s of pneumonia (pt has never really mounted a fever even with her endocarditis episodes though) -remains on steroids longstanding -her daughter is obtaining a pulse ox herself and when her mom gets her nail polish removed, they are going to recheck her POX, all other vs here stable  6. Lupus - follows with rheum, Dr. Ouida Sills -Comprehensive metabolic panel - buprenorphine (BUTRANS) 5 MCG/HR PTWK patch; Place 1 patch (5 mcg total) onto the skin once a week.   Dispense: 1 patch; Refill: 0 - Sedimentation Rate  Labs/tests ordered: Orders Placed This Encounter  Procedures  . Culture, Blood  . Culture, Blood  . CBC With differential/Platelet  . Comprehensive metabolic panel  . Sedimentation Rate    Next appt: keep as scheduled in about a month, keep f/u with Dr. Drucilla Schmidt next month also

## 2013-07-04 LAB — COMPREHENSIVE METABOLIC PANEL
ALT: 53 IU/L — ABNORMAL HIGH (ref 0–32)
AST: 74 IU/L — ABNORMAL HIGH (ref 0–40)
Albumin/Globulin Ratio: 1 — ABNORMAL LOW (ref 1.1–2.5)
Albumin: 3.8 g/dL (ref 3.5–4.8)
Alkaline Phosphatase: 159 IU/L — ABNORMAL HIGH (ref 39–117)
BUN/Creatinine Ratio: 20 (ref 11–26)
BUN: 37 mg/dL — ABNORMAL HIGH (ref 8–27)
CO2: 17 mmol/L — ABNORMAL LOW (ref 18–29)
Calcium: 9.6 mg/dL (ref 8.7–10.3)
Chloride: 94 mmol/L — ABNORMAL LOW (ref 97–108)
Creatinine, Ser: 1.88 mg/dL — ABNORMAL HIGH (ref 0.57–1.00)
GFR calc Af Amer: 30 mL/min/{1.73_m2} — ABNORMAL LOW (ref >59)
GFR calc non Af Amer: 26 mL/min/{1.73_m2} — ABNORMAL LOW (ref >59)
Globulin, Total: 3.7 g/dL (ref 1.5–4.5)
Glucose: 81 mg/dL (ref 65–99)
Potassium: 4.3 mmol/L (ref 3.5–5.2)
Sodium: 135 mmol/L (ref 134–144)
Total Bilirubin: 1.6 mg/dL — ABNORMAL HIGH (ref 0.0–1.2)
Total Protein: 7.5 g/dL (ref 6.0–8.5)

## 2013-07-04 LAB — CBC WITH DIFFERENTIAL
Basophils Absolute: 0.1 10*3/uL (ref 0.0–0.2)
Basos: 1 %
Eos: 1 %
Eosinophils Absolute: 0.1 10*3/uL (ref 0.0–0.4)
HCT: 39.9 % (ref 34.0–46.6)
Hemoglobin: 13.1 g/dL (ref 11.1–15.9)
Immature Grans (Abs): 0 10*3/uL (ref 0.0–0.1)
Immature Granulocytes: 0 %
Lymphocytes Absolute: 0.8 10*3/uL (ref 0.7–3.1)
Lymphs: 8 %
MCH: 32 pg (ref 26.6–33.0)
MCHC: 32.8 g/dL (ref 31.5–35.7)
MCV: 97 fL (ref 79–97)
Monocytes Absolute: 0.7 10*3/uL (ref 0.1–0.9)
Monocytes: 7 %
Neutrophils Absolute: 7.8 10*3/uL — ABNORMAL HIGH (ref 1.4–7.0)
Neutrophils Relative %: 83 %
Platelets: 123 10*3/uL — ABNORMAL LOW (ref 150–379)
RBC: 4.1 x10E6/uL (ref 3.77–5.28)
RDW: 17.8 % — ABNORMAL HIGH (ref 12.3–15.4)
WBC: 9.4 10*3/uL (ref 3.4–10.8)

## 2013-07-04 LAB — SEDIMENTATION RATE: Sed Rate: 6 mm/hr (ref 0–40)

## 2013-07-06 ENCOUNTER — Telehealth: Payer: Self-pay

## 2013-07-06 NOTE — Telephone Encounter (Signed)
Discussed with patient's daughter  Notes Recorded by Gayland Curry, DO on 07/05/2013 at 12:25 PM 1. Her anemia has improved.  2. Her wbc count is normal (going against infection, but she is chronically immunocompromised with her lupus) 3. Platelets are low, but not critically and this is not new. 4. Blood cultures to date are negative, but not final.  5. Liver panel is ABNORMAL. Her transaminases are elevated suggesting a bile duct problem. The one was elevated one month ago, but others were normal. Are any of her medications new in the past month other than the butrans patch?  6. She is in acute renal failure due to her poor appetite and lousy intake. Please call to see how she is doing. If she still feels poorly, I would favor an admission for IVF hydration and workup of the abnormal liver tests.  7. Her ESR is not elevated so I doubt this is lupus related.  Per patient's daughter, patient is slightly better than when last seen at [place]. Per Dr.Reed have patient f/u this week. I reviewed Dr.Reeds schedule- no available slots, scheduled patient to see Hassell Done (NP) on Thursday at 4:00 pm

## 2013-07-09 ENCOUNTER — Encounter: Payer: Self-pay | Admitting: Nurse Practitioner

## 2013-07-09 ENCOUNTER — Ambulatory Visit (INDEPENDENT_AMBULATORY_CARE_PROVIDER_SITE_OTHER): Payer: Medicare Other | Admitting: Nurse Practitioner

## 2013-07-09 VITALS — BP 116/70 | HR 56 | Temp 97.1°F | Resp 14 | Wt 151.8 lb

## 2013-07-09 DIAGNOSIS — R627 Adult failure to thrive: Secondary | ICD-10-CM

## 2013-07-09 DIAGNOSIS — I2510 Atherosclerotic heart disease of native coronary artery without angina pectoris: Secondary | ICD-10-CM

## 2013-07-09 LAB — CULTURE, BLOOD (SINGLE)

## 2013-07-09 NOTE — Progress Notes (Signed)
Patient ID: [name], female   DOB: [birth year], 73 y.o.   MRN: [id number]    Allergies  Allergen Reactions  . Ceftriaxone     RASH BUT TOLERATED AMPICILLIN AND IMIPENEM WITHOUT PROBLEMS  . Codeine Nausea Only    Chief Complaint  Patient presents with  . Follow-up    1 week f/u per Dr Mariea Clonts    HPI: Patient is a 73 y.o. female seen in the office today for follow up per Dr Mariea Clonts. Pt with a complex past medical history including lupus, CKD, CAD, endocarditis, a fib, anemia, pulmonary fibrosis and overall failure to thrive with decrease PO intake  Who was seen last week by Dr Mariea Clonts and appeared acutely ill, labs did not reveal any acute findings and she is here to follow up today to evaluate condition.   Pt here with daughter today, reports she is doing much much better. Eating somewhat better, still not much appetite. No additional falls, walking has improved.  Saw cardiologist today, no new recommendations following PT/INR- got lab today  everything has improved, pt is more alert and aware, breathing is much better Review of Systems:  Review of Systems  Constitutional: Positive for malaise/fatigue. Negative for fever and chills.       Better lately  HENT: Negative for congestion.   Eyes: Negative for blurred vision.  Respiratory: Positive for shortness of breath (still having periods of SOB but has improved).   Cardiovascular: Negative for chest pain, palpitations and leg swelling.  Gastrointestinal: Negative for nausea, vomiting, abdominal pain, diarrhea, constipation, blood in stool and melena.  Genitourinary: Negative for dysuria.  Musculoskeletal: Positive for back pain and falls (none since last week). Negative for joint pain and myalgias.  Skin: Negative for rash.  Neurological: Positive for weakness. Negative for dizziness, focal weakness and loss of consciousness.  Psychiatric/Behavioral: Positive for memory loss. Negative for depression.     Past Medical History    Diagnosis Date  . Coronary artery disease   . Atrial fibrillation     Now NSR  . GERD (gastroesophageal reflux disease)   . Hyperlipidemia   . 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 Anemia   . Angina   . Hypertension   . Blood transfusion     "related to bad case of bronchitis once; 1 yr ago given due to GIB" (12/17/2012)  . H/O hiatal hernia   . Lupus     "that's compromised her lungs somewhat" (12/17/2012)  . Insomnia   . Endocarditis   . Prosthetic valve endocarditis   . Enterococcal bacteremia   . Heart murmur   . CHF (congestive heart failure)   . Pneumonia     "once or twice" (12/17/2012)  . Chronic bronchitis     "used to get it once/yr; hasn't had it since ~ 1986" (12/17/2012)  . Shortness of breath     "all the times sometimes" (12/17/2012)  . On home oxygen therapy     "2L prn; always at night" (12/17/2012)  . Lower GI bleed ~ 01/2012  . Arthritis     "hands" (12/17/2012)  . 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 Givens capsule study  04/09/2011    Procedure: GIVENS CAPSULE STUDY;  Surgeon: Beryle Beams, MD;  Location: [place];  Service: Endoscopy;  Laterality: N/A;  . Tee without cardioversion  02/06/2012  Procedure: TRANSESOPHAGEAL ECHOCARDIOGRAM (TEE);  Surgeon: Birdie Riddle, MD;  Location: [facility] ENDOSCOPY;  Service: Cardiovascular;  Laterality: N/A;  . Esophagogastroduodenoscopy  02/12/2012    Procedure: ESOPHAGOGASTRODUODENOSCOPY (EGD);  Surgeon: Beryle Beams, MD;  Location: [facility] ENDOSCOPY;  Service: Endoscopy;  Laterality: N/A;  . Tee without cardioversion N/A 07/15/2012    Procedure: TRANSESOPHAGEAL ECHOCARDIOGRAM (TEE);  Surgeon: Birdie Riddle, MD;  Location: [facility] ENDOSCOPY;  Service: Cardiovascular;  Laterality: N/A;  . Breast lumpectomy Left 1977    "benign" (12/17/2012)  . Tonsillectomy  1940's  . Coronary artery bypass graft  2006  . Cardiac valve  replacement  2006    "aortic" (12/17/2012) bioprosthetic.   . Cardiac catheterization      "probably 2-3" (12/17/2012)  . Coronary angioplasty with stent placement      "several put in over the years" (12/17/2012)  . Cataract extraction w/ intraocular lens  implant, bilateral Bilateral 11/2000  . Peripherally inserted central catheter insertion Right 12/15/2012    "upper arm" (12/17/2012   Social History:   reports that she has never smoked. She has never used smokeless tobacco. She reports that she does not drink alcohol or use illicit drugs.  Family History  Problem Relation Age of Onset  . Heart disease Mother     CHF  . Mental illness Father     suicide  . Cancer Sister     pancreatic cancer  . COPD Brother     Medications: Patient's Medications  New Prescriptions   No medications on file  Previous Medications   BUPRENORPHINE (BUTRANS) 5 MCG/HR PTWK PATCH    Place 1 patch (5 mcg total) onto the skin once a week.   DIGOXIN (LANOXIN) 0.125 MG TABLET    Take 0.125 mg by mouth every morning.   FUROSEMIDE (LASIX) 20 MG TABLET    Take 1 tablet (20 mg total) by mouth daily.   GABAPENTIN (NEURONTIN) 300 MG CAPSULE    TAKE ONE CAPSULE BY MOUTH AT BEDTIME   HYDROXYCHLOROQUINE (PLAQUENIL) 200 MG TABLET    Take 200 mg by mouth 2 (two) times daily.    IRON PO    Take 1 tablet by mouth 2 (two) times daily.   METOPROLOL (LOPRESSOR) 50 MG TABLET    Take 0.5 tablets (25 mg total) by mouth 2 (two) times daily.   OXYCODONE-ACETAMINOPHEN (PERCOCET/ROXICET) 5-325 MG PER TABLET    Take 1 tablet by mouth every 4 (four) hours as needed for severe pain.   PANTOPRAZOLE (PROTONIX) 40 MG TABLET    Take 40 mg by mouth every morning.   POTASSIUM CHLORIDE SA (K-DUR,KLOR-CON) 20 MEQ TABLET    Take 40-60 mEq by mouth 2 (two) times daily. Takes 2 tablets every morning and takes 3 tablets every evening.   PREDNISONE (DELTASONE) 5 MG TABLET    Take 1 tablet (5 mg total) by mouth every morning.   ROSUVASTATIN  (CRESTOR) 10 MG TABLET    Take 10 mg by mouth every morning.    SULFASALAZINE (AZULFIDINE) 500 MG TABLET    Take 1,000 mg by mouth 2 (two) times daily.    TRAZODONE (DESYREL) 50 MG TABLET    TAKE 1 TABLET BY MOUTH AT BEDTIME AS NEEDED FOR SLEEP   WARFARIN (COUMADIN) 4 MG TABLET    Take 4-6 mg by mouth daily. Takes 4mg  (1tablet) daily, except Wednesday and Sunday, then takes 6mg  (1 1/2 tablets)  Modified Medications   No medications on file  Discontinued Medications   No medications  on file     Physical Exam:  Filed Vitals:   07/09/13 1615  BP: 116/70  Pulse: 56  Temp: 97.1 F (36.2 C)  TempSrc: Oral  Resp: 14  Weight: 151 lb 12.8 oz (68.856 kg)  SpO2: 95%    Physical Exam Physical Exam  Constitutional: She is oriented to person, place, and time.   frail appearing white female in NAD HENT:  Head: Normocephalic.  Eyes: EOM are normal. Pupils are equal, round, and reactive to light.  Neck: No JVD present.  Cardiovascular: Normal rate, regular rhythm and intact distal pulses.  Murmur heard.  Pulmonary/Chest: Effort normal. She exhibits no tenderness.  Minimal wheezing of lungs bilaterally-baseline Abdominal: Soft. Bowel sounds are normal. She exhibits no distension and no mass. There is no tenderness.  Musculoskeletal: Normal range of motion.  Neurological: She is alert and oriented to person, place, and time.  Skin: There is pallor.     Labs reviewed: Basic Metabolic Panel:  Recent Labs  07/15/12 0420 07/16/12 2114  04/27/13 1101 05/25/13 1558 07/03/13 1239  NA 131* 132*  < > 137 139 135  K 3.8 4.3  < > 4.5 2.9* 4.3  CL 97 96  < > 100 102 94*  CO2 27 25  < > 22 26 17*  GLUCOSE 98 110*  < > 86 96 81  BUN 22 20  < > 17 15 37*  CREATININE 1.11* 1.19*  < > 1.28* 0.98 1.88*  CALCIUM 8.9 8.8  < > 8.9 9.2 9.6  PHOS  --  3.1  --   --   --   --   < > = values in this interval not displayed. Liver Function Tests:  Recent Labs  12/12/12 1357 12/16/12 0845  05/25/13 1558 07/03/13 1239  AST 14 21 23  74*  ALT 5 6 19  53*  ALKPHOS 116 112 166* 159*  BILITOT 0.5 0.6 1.0 1.6*  PROT 7.5 6.9 7.2 7.5  ALBUMIN 3.1* 2.5* 3.7  --     Recent Labs  07/11/12 1901 12/16/12 0845  LIPASE 49 64*   No results found for this basename: AMMONIA,  in the last 8760 hours CBC:  Recent Labs  03/02/13 1637 04/27/13 1101 05/25/13 1558 07/03/13 1239  WBC 5.1 3.6 4.6 9.4  NEUTROABS 3.4 1.8  --  7.8*  HGB 10.3* 11.9 11.8* 13.1  HCT 30.2* 35.2 35.0* 39.9  MCV 92.9 94 92.1 97  PLT 128*  --  138* 123*   Lipid Panel:  Recent Labs  04/27/13 1101  HDL 46  LDLCALC 64  TRIG 60  CHOLHDL 2.7    Assessment/Plan  Failure to Thrive -overall doing better today -despite  the improvement pt still very frail, conts to have decreased appetite, encouraged to push fluids and increase PO intake  -started conversation about hospice care if pts conts to decline if she did not want to have further hospitalizations and extensive work ups; also discussed additional resources this services has, pts daughter does not think they are at the point of needing hospice and is aware of the service   To keep follow up with Mariea Clonts

## 2013-07-27 ENCOUNTER — Ambulatory Visit (INDEPENDENT_AMBULATORY_CARE_PROVIDER_SITE_OTHER): Payer: Medicare Other | Admitting: Internal Medicine

## 2013-07-27 ENCOUNTER — Encounter: Payer: Self-pay | Admitting: Internal Medicine

## 2013-07-27 VITALS — BP 128/76 | HR 60 | Resp 10 | Wt 144.0 lb

## 2013-07-27 DIAGNOSIS — Z5181 Encounter for therapeutic drug level monitoring: Secondary | ICD-10-CM

## 2013-07-27 DIAGNOSIS — R7402 Elevation of levels of lactic acid dehydrogenase (LDH): Secondary | ICD-10-CM

## 2013-07-27 DIAGNOSIS — R630 Anorexia: Secondary | ICD-10-CM

## 2013-07-27 DIAGNOSIS — R634 Abnormal weight loss: Secondary | ICD-10-CM

## 2013-07-27 DIAGNOSIS — R740 Nonspecific elevation of levels of transaminase and lactic acid dehydrogenase [LDH]: Principal | ICD-10-CM

## 2013-07-27 DIAGNOSIS — R7401 Elevation of levels of liver transaminase levels: Secondary | ICD-10-CM

## 2013-07-27 MED ORDER — GABAPENTIN 300 MG PO CAPS: ORAL_CAPSULE | ORAL | Status: DC

## 2013-07-27 MED ORDER — ZOSTER VACCINE LIVE 19400 UNT/0.65ML ~~LOC~~ SOLR: 0.6500 mL | Freq: Once | SUBCUTANEOUS | Status: DC

## 2013-07-27 NOTE — Progress Notes (Signed)
Patient ID: [name], female   DOB: 01-30-1941, 73 y.o.   MRN: [id number]   Location:  [facility]  Code Status: full code  Allergies  Allergen Reactions  . Ceftriaxone     RASH BUT TOLERATED AMPICILLIN AND IMIPENEM WITHOUT PROBLEMS  . Codeine Nausea Only    Chief Complaint  Patient presents with  . Medical Management of Chronic Issues    3 month follow-up   . Weight Loss    Patient with 7 pound weight loss since last OV   . Results    Discuss Gentic testing     HPI: Patient is a 73 y.o. white female seen in the office today for medical mgt of chronic diseases.  She has lost 7 lbs since he last visit and continues to eat poorly according to her daughter.  Eating is more down than up.  Won't drink ensure, etc.  Discussed multivitamin.  May affect coumadin at first.  Feels full a lot of the time.  Her daughter fixes her plate, and she tries to eat it, but cannot finish it.  Is not eating the snacks and chocolate she normally enjoys.  Has had two packs of mallow cups she hasn't eaten in a 2 mo period.  Has a lot of ups and downs.    Is losing balance and weak.  Is taking amoxicillin bid--daughter questions if she is suppressing the infection just enough for it not to show up.    Is occasionally vomiting.  Coughs up something.  Doesn't happen when she's eaten more.    Has been on the amoxicillin orally since early November. Wants to get her strength back so she can drive her car and come and go.    Using butrans patch to help with pain.  Not using percocet or trazodone.  Is sleeping better since pain controlled.  Review of Systems:  Review of Systems  Constitutional: Positive for weight loss and malaise/fatigue. Negative for fever and chills.  Respiratory: Positive for cough and shortness of breath.   Cardiovascular: Negative for chest pain.  Gastrointestinal: Positive for vomiting.       Early satiety, eats only small amts    Musculoskeletal: Negative for back pain and falls.       Multiple near falls  Skin: Negative for rash.  Neurological: Positive for dizziness and weakness.  Psychiatric/Behavioral: The patient does not have insomnia.     Past Medical History  Diagnosis Date  . Coronary artery disease   . Atrial fibrillation     Now NSR  . GERD (gastroesophageal reflux disease)   . Hyperlipidemia   . 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 Anemia   . Angina   . Hypertension   . Blood transfusion     "related to bad case of bronchitis once; 1 yr ago given due to GIB" (12/17/2012)  . H/O hiatal hernia   . Lupus     "that's compromised her lungs somewhat" (12/17/2012)  . Insomnia   . Endocarditis   . Prosthetic valve endocarditis   . Enterococcal bacteremia   . Heart murmur   . CHF (congestive heart failure)   . Pneumonia     "once or twice" (12/17/2012)  . Chronic bronchitis     "used to get it once/yr; hasn't had it since ~ 1986" (12/17/2012)  . Shortness of breath     "all the times sometimes" (12/17/2012)  .  On home oxygen therapy     "2L prn; always at night" (12/17/2012)  . Lower GI bleed ~ 01/2012  . Arthritis     "hands" (12/17/2012)  . 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 Givens capsule study  04/09/2011    Procedure: GIVENS CAPSULE STUDY;  Surgeon: Beryle Beams, MD;  Location: [place];  Service: Endoscopy;  Laterality: N/A;  . Tee without cardioversion  02/06/2012    Procedure: TRANSESOPHAGEAL ECHOCARDIOGRAM (TEE);  Surgeon: Birdie Riddle, MD;  Location: [facility] ENDOSCOPY;  Service: Cardiovascular;  Laterality: N/A;  . Esophagogastroduodenoscopy  02/12/2012    Procedure: ESOPHAGOGASTRODUODENOSCOPY (EGD);  Surgeon: Beryle Beams, MD;  Location: [facility] ENDOSCOPY;  Service: Endoscopy;  Laterality: N/A;  . Tee without cardioversion N/A 07/15/2012    Procedure: TRANSESOPHAGEAL  ECHOCARDIOGRAM (TEE);  Surgeon: Birdie Riddle, MD;  Location: [facility] ENDOSCOPY;  Service: Cardiovascular;  Laterality: N/A;  . Breast lumpectomy Left 1977    "benign" (12/17/2012)  . Tonsillectomy  1940's  . Coronary artery bypass graft  2006  . 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   . Cardiac catheterization      "probably 2-3" (12/17/2012)  . Coronary angioplasty with stent placement      "several put in over the years" (12/17/2012)  . Cataract extraction w/ intraocular lens  implant, bilateral Bilateral 11/2000  . Peripherally inserted central catheter insertion Right 12/15/2012    "upper arm" (12/17/2012    Social History:   reports that she has never smoked. She has never used smokeless tobacco. She reports that she does not drink alcohol or use illicit drugs.  Family History  Problem Relation Age of Onset  . Heart disease Mother     CHF  . Mental illness Father     suicide  . Cancer Sister     pancreatic cancer  . COPD Brother     Medications: Patient's Medications  New Prescriptions   No medications on file  Previous Medications   BUPRENORPHINE (BUTRANS) 5 MCG/HR PTWK PATCH    Place 1 patch (5 mcg total) onto the skin once a week.   DIGOXIN (LANOXIN) 0.125 MG TABLET    Take 0.125 mg by mouth every morning.   FUROSEMIDE (LASIX) 20 MG TABLET    Take 1 tablet (20 mg total) by mouth daily.   HYDROXYCHLOROQUINE (PLAQUENIL) 200 MG TABLET    Take 200 mg by mouth 2 (two) times daily.    IRON PO    Take 1 tablet by mouth 2 (two) times daily.   METOPROLOL (LOPRESSOR) 50 MG TABLET    Take 0.5 tablets (25 mg total) by mouth 2 (two) times daily.   OXYCODONE-ACETAMINOPHEN (PERCOCET/ROXICET) 5-325 MG PER TABLET    Take 1 tablet by mouth every 4 (four) hours as needed for severe pain.   PANTOPRAZOLE (PROTONIX) 40 MG TABLET    Take 40 mg by mouth every morning.   POTASSIUM CHLORIDE SA (K-DUR,KLOR-CON) 20 MEQ TABLET    Take 40-60 mEq by mouth 2 (two) times  daily. Takes 2 tablets every morning and takes 3 tablets every evening.   PREDNISONE (DELTASONE) 5 MG TABLET    Take 1 tablet (5 mg total) by mouth every morning.   ROSUVASTATIN (CRESTOR) 10 MG TABLET    Take 10 mg by mouth every morning.    TRAZODONE (DESYREL) 50 MG TABLET  TAKE 1 TABLET BY MOUTH AT BEDTIME AS NEEDED FOR SLEEP   WARFARIN (COUMADIN) 4 MG TABLET    Take 4-6 mg by mouth daily. Takes 4mg  (1tablet) daily, except Wednesday, Friday and Sunday, then takes 6mg  (1 1/2 tablets)  Modified Medications   Modified Medication Previous Medication   GABAPENTIN (NEURONTIN) 300 MG CAPSULE gabapentin (NEURONTIN) 300 MG capsule      TAKE ONE CAPSULE BY MOUTH AT BEDTIME    TAKE ONE CAPSULE BY MOUTH AT BEDTIME   ZOSTER VACCINE LIVE, PF, (ZOSTAVAX) 06269 UNT/0.65ML INJECTION zoster vaccine live, PF, (ZOSTAVAX) 48546 UNT/0.65ML injection      Inject 19,400 Units into the skin once.    Inject 0.65 mLs into the skin once.  Discontinued Medications   SULFASALAZINE (AZULFIDINE) 500 MG TABLET    Take 1,000 mg by mouth 2 (two) times daily.      Physical Exam: Filed Vitals:   07/27/13 1054  BP: 128/76  Pulse: 60  Resp: 10  Weight: 144 lb (65.318 kg)  SpO2: 97%  Physical Exam  Constitutional: She is oriented to person, place, and time.  Increasingly frail appearing white female, walks unsteadily with cane (but came in wheelchair last visit)  Cardiovascular:  irreg irreg, 2/6 systolic murmur  Pulmonary/Chest: Effort normal and breath sounds normal. She has no wheezes. She has no rales.  Abdominal: Soft. Bowel sounds are normal. She exhibits no distension and no mass. There is no tenderness.  Musculoskeletal: Normal range of motion. She exhibits no edema and no tenderness.  Neurological: She is alert and oriented to person, place, and time.  Skin: Skin is warm and dry. There is pallor.  Psychiatric: She has a normal mood and affect.    Labs reviewed: Basic Metabolic Panel:  Recent Labs   04/27/13 1101 05/25/13 1558 07/03/13 1239  NA 137 139 135  K 4.5 2.9* 4.3  CL 100 102 94*  CO2 22 26 17*  GLUCOSE 86 96 81  BUN 17 15 37*  CREATININE 1.28* 0.98 1.88*  CALCIUM 8.9 9.2 9.6   Liver Function Tests:  Recent Labs  12/12/12 1357 12/16/12 0845 05/25/13 1558 07/03/13 1239  AST 14 21 23  74*  ALT 5 6 19  53*  ALKPHOS 116 112 166* 159*  BILITOT 0.5 0.6 1.0 1.6*  PROT 7.5 6.9 7.2 7.5  ALBUMIN 3.1* 2.5* 3.7  --     Recent Labs  12/16/12 0845  LIPASE 64*   No results found for this basename: AMMONIA,  in the last 8760 hours CBC:  Recent Labs  03/02/13 1637 04/27/13 1101 05/25/13 1558 07/03/13 1239  WBC 5.1 3.6 4.6 9.4  NEUTROABS 3.4 1.8  --  7.8*  HGB 10.3* 11.9 11.8* 13.1  HCT 30.2* 35.2 35.0* 39.9  MCV 92.9 94 92.1 97  PLT 128*  --  138* 123*   Lipid Panel:  Recent Labs  04/27/13 1101  HDL 46  LDLCALC 64  TRIG 60  CHOLHDL 2.7   Assessment/Plan 1. Transaminitis -noted on labs along with loss of appetite, weight loss, and early satiety - Comprehensive metabolic panel - CT Abdomen Pelvis W Contrast; Future to rule out malignancy as a cause  2. Loss of appetite - may be due to her endocarditis which is being suppressed by amoxicillin--continue indefinitely due to h/o recurrence - Comprehensive metabolic panel - CT Abdomen Pelvis W Contrast; Future  3. Loss of weight - suspect also due to her underlying infection, but will rule out other cause - Comprehensive metabolic  panel - CT Abdomen Pelvis W Contrast; Future  4. Encounter for therapeutic drug monitoring -reviewed her genetic testing results with her and her daughter today--no specific changes to her regimen were needed, but this will be scanned so we can use it in the future to select the best meds for her  Labs/tests ordered: Orders Placed This Encounter  Procedures  . CT Abdomen Pelvis W Contrast    LABS DRAWN 07/27/13 NO RESULTS YET WILL BE IN EPIC 144 LBS/NOT DIABETIC/PT HAS A  WALKER/PT TO P/U/CM INS MEDICARE & BCBS/SM/EPIC ORDER DORTHY    Standing Status: Future     Number of Occurrences: 1     Standing Expiration Date: 10/28/2014    Order Specific Question:  Reason for Exam (SYMPTOM  OR DIAGNOSIS REQUIRED)    Answer:  early satiety, weight loss, failure to thrive, transaminitis, sister with pancreatic ca    Order Specific Question:  Preferred imaging location?    Answer:  [facility]  . Comprehensive metabolic panel    Next appt:  1 month

## 2013-07-29 LAB — COMPREHENSIVE METABOLIC PANEL
ALT: 6 IU/L (ref 0–32)
AST: 20 IU/L (ref 0–40)
Albumin/Globulin Ratio: 1 — ABNORMAL LOW (ref 1.1–2.5)
Albumin: 3.3 g/dL — ABNORMAL LOW (ref 3.5–4.8)
Alkaline Phosphatase: 106 IU/L (ref 39–117)
BUN/Creatinine Ratio: 14 (ref 11–26)
BUN: 21 mg/dL (ref 8–27)
CO2: 24 mmol/L (ref 18–29)
Calcium: 9.2 mg/dL (ref 8.7–10.3)
Chloride: 98 mmol/L (ref 97–108)
Creatinine, Ser: 1.45 mg/dL — ABNORMAL HIGH (ref 0.57–1.00)
GFR calc Af Amer: 41 mL/min/{1.73_m2} — ABNORMAL LOW (ref >59)
GFR calc non Af Amer: 35 mL/min/{1.73_m2} — ABNORMAL LOW (ref >59)
Globulin, Total: 3.2 g/dL (ref 1.5–4.5)
Glucose: 75 mg/dL (ref 65–99)
Potassium: 4.3 mmol/L (ref 3.5–5.2)
Sodium: 140 mmol/L (ref 134–144)
Total Bilirubin: 1.8 mg/dL — ABNORMAL HIGH (ref 0.0–1.2)
Total Protein: 6.5 g/dL (ref 6.0–8.5)

## 2013-08-03 ENCOUNTER — Ambulatory Visit
Admission: RE | Admit: 2013-08-03 | Discharge: 2013-08-03 | Disposition: A | Discharge status: Home / Self Care | Payer: Medicare Other | Source: Ambulatory Visit | Attending: Internal Medicine | Admitting: Internal Medicine

## 2013-08-03 ENCOUNTER — Encounter: Payer: Self-pay | Admitting: Infectious Disease

## 2013-08-03 ENCOUNTER — Ambulatory Visit (INDEPENDENT_AMBULATORY_CARE_PROVIDER_SITE_OTHER): Payer: Medicare Other | Admitting: Infectious Disease

## 2013-08-03 VITALS — BP 111/70 | HR 62 | Temp 97.5°F | Wt 147.0 lb

## 2013-08-03 DIAGNOSIS — R630 Anorexia: Secondary | ICD-10-CM

## 2013-08-03 DIAGNOSIS — I2510 Atherosclerotic heart disease of native coronary artery without angina pectoris: Secondary | ICD-10-CM

## 2013-08-03 DIAGNOSIS — T827XXA Infection and inflammatory reaction due to other cardiac and vascular devices, implants and grafts, initial encounter: Secondary | ICD-10-CM

## 2013-08-03 DIAGNOSIS — R7401 Elevation of levels of liver transaminase levels: Secondary | ICD-10-CM

## 2013-08-03 DIAGNOSIS — R740 Nonspecific elevation of levels of transaminase and lactic acid dehydrogenase [LDH]: Principal | ICD-10-CM

## 2013-08-03 DIAGNOSIS — M545 Low back pain, unspecified: Secondary | ICD-10-CM

## 2013-08-03 DIAGNOSIS — B952 Enterococcus as the cause of diseases classified elsewhere: Secondary | ICD-10-CM

## 2013-08-03 DIAGNOSIS — R5383 Other fatigue: Secondary | ICD-10-CM

## 2013-08-03 DIAGNOSIS — R634 Abnormal weight loss: Secondary | ICD-10-CM

## 2013-08-03 DIAGNOSIS — R5381 Other malaise: Secondary | ICD-10-CM

## 2013-08-03 DIAGNOSIS — I38 Endocarditis, valve unspecified: Secondary | ICD-10-CM

## 2013-08-03 DIAGNOSIS — R7881 Bacteremia: Secondary | ICD-10-CM

## 2013-08-03 DIAGNOSIS — T826XXA Infection and inflammatory reaction due to cardiac valve prosthesis, initial encounter: Secondary | ICD-10-CM

## 2013-08-03 MED ORDER — IOHEXOL 300 MG/ML  SOLN: 100.0000 mL | Freq: Once | INTRAMUSCULAR | Status: DC | PRN

## 2013-08-03 MED ORDER — IOHEXOL 300 MG/ML  SOLN
75.0000 mL | Freq: Once | INTRAMUSCULAR | Status: AC | PRN
  Administered 2013-08-03: 75 mL via INTRAVENOUS

## 2013-08-03 NOTE — Progress Notes (Signed)
HPI  73 y.o. 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  When I last saw her she was suffering from severe LBP.  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  Mild bilateral lateral recess stenosis, right greater than left at  L3-4. There is also mild foraminal encroachment bilaterally.  Mild bilateral lateral recess stenosis and mild right foraminal  encroachment at L4-5.  Degenerate disc disease and focal central disc protrusion at L5-S1  with mild mass effect on the ventral thecal sac   She continues to suffer from some weight loss malaise fatigue poor appetite. Some of these symptoms preceded her prior episodes of bacteremia. Had extensive discussions with the patient and her daughter about the possibility have persistent infection in fact I believe she does still have persistent infection in her heart valve which needs to be resected but which she cannot have removed due to the fact she has not good operative candidate.  I recommended continuing her on amoxicillin indefinitely. She had a workup for abdominal pain done by her primary care physician with a CT scan of the abdomen pelvis and this will be done later today.     Review of Systems  Constitutional: Positive for activity change. Negative for fever, chills, diaphoresis, appetite change, fatigue and unexpected weight change.  HENT: Negative for congestion and dental problem.   Eyes: Negative for photophobia and visual disturbance.  Respiratory: Negative for cough.   Gastrointestinal: Negative for nausea, vomiting, abdominal pain and diarrhea.  Genitourinary: Negative  for dysuria, hematuria and difficulty urinating.  Musculoskeletal: Negative for arthralgias, back pain, gait problem, joint swelling and myalgias.  Skin: Negative for color change, pallor, rash and wound.  Neurological: Positive for weakness. Negative for dizziness, tremors and light-headedness.  Hematological: Negative for adenopathy. Does not bruise/bleed easily.  Psychiatric/Behavioral: Negative for behavioral problems, sleep disturbance, dysphoric mood, decreased concentration and agitation.     Physical Exam  Nursing note and vitals reviewed. Constitutional: She is oriented to person, place, and time. No distress.  HENT:  Head: Normocephalic and atraumatic.  Mouth/Throat: Oropharynx is clear and moist.  Eyes: EOM are normal.  Neck: Normal range of motion. Neck supple.  Cardiovascular: Normal rate and regular rhythm.  Exam reveals no gallop and no friction rub.   Murmur heard.  Systolic murmur is present with a grade of 2/6  Pulmonary/Chest: Effort normal and breath sounds normal. No respiratory distress. She has no wheezes. She has no rales. She exhibits no tenderness.  Abdominal: She exhibits no distension and no mass. There is no tenderness. There is no rebound and no guarding.  Musculoskeletal: She exhibits no edema and no tenderness.       Arms: Neurological: She is alert and oriented to person, place, and time. She exhibits normal muscle tone. Coordination normal.  Skin: Skin is warm and dry. She is not diaphoretic. No erythema. No pallor.  Psychiatric: She has a normal mood and affect. Her behavior is normal. Judgment and thought content normal.    Assessment/Plan:   [name] is a 73 y.o. 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   --continue amoxicillin indefinitely I spent greater than 25 minutes with the patient including greater than 50% of time in face to face counsel of the patient and in coordination of their care.  #2 back pain: no infection at this site  #3 prior confusion: likely related to narcotics  #4 Malaise, poor oral intake: not clear what is causing this. Would followup her CT scan and if this is unrevealing could consider endoscopy she has no evidence of thrush on my exam today.

## 2013-08-17 ENCOUNTER — Encounter: Payer: Self-pay | Admitting: Internal Medicine

## 2013-08-31 ENCOUNTER — Ambulatory Visit (INDEPENDENT_AMBULATORY_CARE_PROVIDER_SITE_OTHER): Payer: Medicare Other | Admitting: Internal Medicine

## 2013-08-31 ENCOUNTER — Encounter: Payer: Self-pay | Admitting: Internal Medicine

## 2013-08-31 VITALS — BP 122/70 | HR 65 | Temp 97.1°F | Resp 10 | Wt 140.0 lb

## 2013-08-31 DIAGNOSIS — R634 Abnormal weight loss: Secondary | ICD-10-CM

## 2013-08-31 DIAGNOSIS — I2510 Atherosclerotic heart disease of native coronary artery without angina pectoris: Secondary | ICD-10-CM

## 2013-08-31 DIAGNOSIS — R627 Adult failure to thrive: Secondary | ICD-10-CM

## 2013-08-31 DIAGNOSIS — T3695XA Adverse effect of unspecified systemic antibiotic, initial encounter: Secondary | ICD-10-CM

## 2013-08-31 DIAGNOSIS — I38 Endocarditis, valve unspecified: Secondary | ICD-10-CM

## 2013-08-31 DIAGNOSIS — K521 Toxic gastroenteritis and colitis: Secondary | ICD-10-CM

## 2013-08-31 DIAGNOSIS — R197 Diarrhea, unspecified: Secondary | ICD-10-CM

## 2013-08-31 DIAGNOSIS — I4891 Unspecified atrial fibrillation: Secondary | ICD-10-CM

## 2013-08-31 MED ORDER — MIRTAZAPINE 7.5 MG PO TABS: 7.5000 mg | ORAL_TABLET | Freq: Every day | ORAL | Status: DC

## 2013-08-31 MED ORDER — SACCHAROMYCES BOULARDII 250 MG PO CAPS: 250.0000 mg | ORAL_CAPSULE | Freq: Two times a day (BID) | ORAL | Status: AC

## 2013-08-31 NOTE — Progress Notes (Signed)
Patient ID: [name], female   DOB: 11/27/1940, 73 y.o.   MRN: [id number]   Location:  [facility]  Code Status: DNR discussed previously  Allergies  Allergen Reactions  . Ceftriaxone     RASH BUT TOLERATED AMPICILLIN AND IMIPENEM WITHOUT PROBLEMS  . Codeine Nausea Only    Chief Complaint  Patient presents with  . Medical Management of Chronic Issues    1 month follow-up.   . Edema    Lower legs and bilateral foot swelling, ongoing concern. Swelling worse x copule weeks   . Diarrhea    ? if diarrhea is related to medicaitons, patient unable to recall last normal bowel.     HPI: Patient is a 73 y.o. white female seen in the office today for medical mgt of chronic diseases.    Did not tolerate the barium she had to drink--nausea and severe diarrhea.    Still having a lot of blah days.   Swelling of feet--saw renal today and lasix quadrupled for 2 days, then doubled thereafter, had urine and blood work Nephrologist thinks she is putting out protein and causing legs to swell  Loose stools persist.  Happen every 3rd day--one big time.  Loose stool, not watery, not slimy or mucusy, not particularly foul smelling. Often hits her when she's asleep and has gone before she can get up to go to the restroom. Continues to lose weight, strength and energy and having falls.  Will sometimes use the walker.  Mostly occur at night. Encouraged her to wear the buzzer--medalert.    Sleeps well.  Down 21 lbs since April 10th.        Review of Systems:  Review of Systems  Constitutional: Positive for weight loss and malaise/fatigue. Negative for fever and chills.  HENT: Negative for congestion.   Eyes: Negative for blurred vision.  Respiratory: Negative for cough and shortness of breath.   Cardiovascular: Positive for leg swelling. Negative for chest pain.  Gastrointestinal: Positive for diarrhea. Negative for heartburn, nausea, vomiting, abdominal  pain, constipation, blood in stool and melena.  Genitourinary: Negative for dysuria.  Musculoskeletal: Positive for falls.  Skin: Negative for rash.  Neurological: Positive for weakness. Negative for dizziness, loss of consciousness and headaches.  Endo/Heme/Allergies: Bruises/bleeds easily.  Psychiatric/Behavioral: Positive for memory loss. Negative for depression.       Only a small amt of memory loss (mild cognitive impairment)    Past Medical History  Diagnosis Date  . Coronary artery disease   . Atrial fibrillation     Now NSR  . GERD (gastroesophageal reflux disease)   . Hyperlipidemia   . 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 Anemia   . Angina   . Hypertension   . Blood transfusion     "related to bad case of bronchitis once; 1 yr ago given due to GIB" (12/17/2012)  . H/O hiatal hernia   . Lupus     "that's compromised her lungs somewhat" (12/17/2012)  . Insomnia   . Endocarditis   . Prosthetic valve endocarditis   . Enterococcal bacteremia   . Heart murmur   . CHF (congestive heart failure)   . Pneumonia     "once or twice" (12/17/2012)  . Chronic bronchitis     "used to get it once/yr; hasn't had it since ~ 1986" (12/17/2012)  . Shortness of breath     "all the times sometimes" (12/17/2012)  .  On home oxygen therapy     "2L prn; always at night" (12/17/2012)  . Lower GI bleed ~ 01/2012  . Arthritis     "hands" (12/17/2012)  . 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 Givens capsule study  04/09/2011    Procedure: GIVENS CAPSULE STUDY;  Surgeon: Beryle Beams, MD;  Location: [place];  Service: Endoscopy;  Laterality: N/A;  . Tee without cardioversion  02/06/2012    Procedure: TRANSESOPHAGEAL ECHOCARDIOGRAM (TEE);  Surgeon: Birdie Riddle, MD;  Location: [facility] ENDOSCOPY;  Service: Cardiovascular;  Laterality: N/A;  . Esophagogastroduodenoscopy  02/12/2012     Procedure: ESOPHAGOGASTRODUODENOSCOPY (EGD);  Surgeon: Beryle Beams, MD;  Location: [facility] ENDOSCOPY;  Service: Endoscopy;  Laterality: N/A;  . Tee without cardioversion N/A 07/15/2012    Procedure: TRANSESOPHAGEAL ECHOCARDIOGRAM (TEE);  Surgeon: Birdie Riddle, MD;  Location: [facility] ENDOSCOPY;  Service: Cardiovascular;  Laterality: N/A;  . Breast lumpectomy Left 1977    "benign" (12/17/2012)  . Tonsillectomy  1940's  . Coronary artery bypass graft  2006  . 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   . Cardiac catheterization      "probably 2-3" (12/17/2012)  . Coronary angioplasty with stent placement      "several put in over the years" (12/17/2012)  . Cataract extraction w/ intraocular lens  implant, bilateral Bilateral 11/2000  . Peripherally inserted central catheter insertion Right 12/15/2012    "upper arm" (12/17/2012    Social History:   reports that she has never smoked. She has never used smokeless tobacco. She reports that she does not drink alcohol or use illicit drugs.  Family History  Problem Relation Age of Onset  . Heart disease Mother     CHF  . Mental illness Father     suicide  . Cancer Sister     pancreatic cancer  . COPD Brother     Medications: Patient's Medications  New Prescriptions   No medications on file  Previous Medications   BUPRENORPHINE (BUTRANS) 5 MCG/HR PTWK PATCH    Place 1 patch (5 mcg total) onto the skin once a week.   DIGOXIN (LANOXIN) 0.125 MG TABLET    Take 0.125 mg by mouth every morning.   GABAPENTIN (NEURONTIN) 300 MG CAPSULE    TAKE ONE CAPSULE BY MOUTH AT BEDTIME   HYDROXYCHLOROQUINE (PLAQUENIL) 200 MG TABLET    Take 200 mg by mouth 2 (two) times daily.    IRON PO    Take 1 tablet by mouth 2 (two) times daily.   METOPROLOL (LOPRESSOR) 50 MG TABLET    Take 0.5 tablets (25 mg total) by mouth 2 (two) times daily.   PANTOPRAZOLE (PROTONIX) 40 MG TABLET    Take 40 mg by mouth every morning.   POTASSIUM CHLORIDE SA  (K-DUR,KLOR-CON) 20 MEQ TABLET    Take 40-60 mEq by mouth 2 (two) times daily. Takes 2 tablets every morning and takes 3 tablets every evening.   PREDNISONE (DELTASONE) 5 MG TABLET    Take 1 tablet (5 mg total) by mouth every morning.   ROSUVASTATIN (CRESTOR) 10 MG TABLET    Take 10 mg by mouth every morning.    WARFARIN (COUMADIN) 4 MG TABLET    Take 4-6 mg by mouth daily. Takes 4mg  (1tablet) daily, except Wednesday, Friday and Sunday, then takes 6mg  (1 1/2 tablets)   ZOSTER VACCINE LIVE, PF, (  ZOSTAVAX) 96789 UNT/0.65ML INJECTION    Inject 19,400 Units into the skin once.  Modified Medications   Modified Medication Previous Medication   FUROSEMIDE (LASIX) 20 MG TABLET furosemide (LASIX) 20 MG tablet      Take 20 mg by mouth 2 (two) times daily.    Take 1 tablet (20 mg total) by mouth daily.  Discontinued Medications   No medications on file     Physical Exam: Filed Vitals:   08/31/13 1508  BP: 122/70  Pulse: 65  Temp: 97.1 F (36.2 C)  TempSrc: Oral  Resp: 10  Weight: 140 lb (63.504 kg)  SpO2: 94%  Physical Exam  Constitutional: She is oriented to person, place, and time.  Frail, pale female seated in wheelchair  HENT:  Head: Normocephalic and atraumatic.  Cardiovascular:  irreg irreg;  3/6 systolic murmur, also heard a diastolic murmur today I had not previously heard;  2+ pitting edema present that is new  Pulmonary/Chest: Effort normal and breath sounds normal. She has no rales.  Abdominal: Soft. Bowel sounds are normal. She exhibits no distension and no mass. There is no tenderness.  Musculoskeletal: Normal range of motion. She exhibits no edema and no tenderness.  Neurological: She is alert and oriented to person, place, and time.  Skin: Skin is warm and dry. There is pallor.  Psychiatric: She has a normal mood and affect.     Labs reviewed: Basic Metabolic Panel:  Recent Labs  05/25/13 1558 07/03/13 1239 07/27/13 1207  NA 139 135 140  K 2.9* 4.3 4.3  CL 102  94* 98  CO2 26 17* 24  GLUCOSE 96 81 75  BUN 15 37* 21  CREATININE 0.98 1.88* 1.45*  CALCIUM 9.2 9.6 9.2   Liver Function Tests:  Recent Labs  12/12/12 1357 12/16/12 0845 05/25/13 1558 07/03/13 1239 07/27/13 1207  AST 14 21 23  74* 20  ALT 5 6 19  53* 6  ALKPHOS 116 112 166* 159* 106  BILITOT 0.5 0.6 1.0 1.6* 1.8*  PROT 7.5 6.9 7.2 7.5 6.5  ALBUMIN 3.1* 2.5* 3.7  --   --     Recent Labs  12/16/12 0845  LIPASE 64*  CBC:  Recent Labs  03/02/13 1637 04/27/13 1101 05/25/13 1558 07/03/13 1239  WBC 5.1 3.6 4.6 9.4  NEUTROABS 3.4 1.8  --  7.8*  HGB 10.3* 11.9 11.8* 13.1  HCT 30.2* 35.2 35.0* 39.9  MCV 92.9 94 92.1 97  PLT 128*  --  138* 123*   Lipid Panel:  Recent Labs  04/27/13 1101  HDL 46  LDLCALC 64  TRIG 60  CHOLHDL 2.7    Assessment/Plan 1. Loss of weight -continues--seems this could be due to the diarrhea vs. Simply her chronically suppressed bacterial infection (with the amoxicillin that she will be on indefinitely) - I've asked her to provide a stool sample for c diff and culture due to this ongoing--also encouraged yogurt, but her daughter did not think she would eat it so florastor was prescribed -Clostridium difficile  - Stool culture - will also add mirtazapine (REMERON) 7.5 MG tablet; Take 1 tablet (7.5 mg total) by mouth at bedtime. For appetite  Dispense: 30 tablet; Refill: 3 - saccharomyces boulardii (FLORASTOR) 250 MG capsule; Take 1 capsule (250 mg total) by mouth 2 (two) times daily.  Dispense: 60 capsule; Refill: 3  2. Antibiotic-associated diarrhea - r/o cdiff - Clostridium difficile EIA - Stool culture - saccharomyces boulardii (FLORASTOR) 250 MG capsule; Take 1 capsule (250 mg  total) by mouth 2 (two) times daily.  Dispense: 60 capsule; Refill: 3  3. Failure to thrive in adult -r/o infectious diarrhea as cause, but may simply be due to her ongoing infection kept at [place] with abx -have discussed that we not be able to make her better  any longer if the diarrhea is not the cause of her decline  4. Endocarditis -cont amoxicillin as per ID indefinitely -seems she now has both a systolic and diastolic murmur -her daughter is going to take her back to Dr. Terrence Dupont for reevaluation also b/c of her increased edema (lasix increased by renal this am as in hpi)  5. A-fib -chronic, persistent, continues on coumadin -rate is controlled  Labs/tests ordered:   Orders Placed This Encounter  Procedures  . Clostridium difficile EIA  . Stool culture    Next appt:  1 month

## 2013-09-08 ENCOUNTER — Other Ambulatory Visit: Payer: Medicare Other

## 2013-09-08 ENCOUNTER — Other Ambulatory Visit: Payer: Self-pay | Admitting: *Deleted

## 2013-09-08 ENCOUNTER — Telehealth: Payer: Self-pay | Admitting: *Deleted

## 2013-09-08 DIAGNOSIS — R197 Diarrhea, unspecified: Secondary | ICD-10-CM

## 2013-09-08 NOTE — Telephone Encounter (Signed)
Spoke with daughter about stool sample, and she has finally got it and plans to bring it to office as soon as possible. No diarrhea is happening but not as often.

## 2013-09-08 NOTE — Telephone Encounter (Signed)
Message copied by RICE, SHARON L on Tue Sep 08, 2013 11:09 AM ------      Message from: Gayland Curry      Created: Tue Sep 08, 2013  8:03 AM       Please call Ms. Ezra's daughter to find out if she has been unable to make the stool samples--stool culture and c diff?  They "expired" in the system so I was notified.  Has her diarrhea resolved or happening only when she's not prepared?            ----- Message -----         From: SYSTEM         Sent: 09/05/2013  12:01 AM           To: Gayland Curry, DO                   ------

## 2013-09-10 ENCOUNTER — Telehealth: Payer: Self-pay

## 2013-09-10 ENCOUNTER — Other Ambulatory Visit: Payer: Medicare Other

## 2013-09-10 DIAGNOSIS — I38 Endocarditis, valve unspecified: Secondary | ICD-10-CM

## 2013-09-10 DIAGNOSIS — R5383 Other fatigue: Secondary | ICD-10-CM

## 2013-09-10 NOTE — Telephone Encounter (Signed)
Message left on triage voicemail, Patient unable to stand on her own, unable to sit up (leaning over) x 24 hours. Patient's daughter spoke with the on-call nurse and was told patient may need some additional lab work.  I spoke with Dr.Reed, called patient's daughter(Penny) and informed her patient to come in per Dr.Reed for labs

## 2013-09-10 NOTE — Addendum Note (Signed)
Addended by: Logan Bores on: 09/10/2013 02:54 PM   Modules accepted: Orders

## 2013-09-11 LAB — COMPREHENSIVE METABOLIC PANEL
ALT: 10 IU/L (ref 0–32)
AST: 20 IU/L (ref 0–40)
Albumin/Globulin Ratio: 1.2 (ref 1.1–2.5)
Albumin: 3.3 g/dL — ABNORMAL LOW (ref 3.5–4.8)
Alkaline Phosphatase: 101 IU/L (ref 39–117)
BUN/Creatinine Ratio: 20 (ref 11–26)
BUN: 29 mg/dL — ABNORMAL HIGH (ref 8–27)
CO2: 25 mmol/L (ref 18–29)
Calcium: 9 mg/dL (ref 8.7–10.3)
Chloride: 101 mmol/L (ref 97–108)
Creatinine, Ser: 1.46 mg/dL — ABNORMAL HIGH (ref 0.57–1.00)
GFR calc Af Amer: 40 mL/min/{1.73_m2} — ABNORMAL LOW (ref >59)
GFR calc non Af Amer: 35 mL/min/{1.73_m2} — ABNORMAL LOW (ref >59)
Globulin, Total: 2.8 g/dL (ref 1.5–4.5)
Glucose: 91 mg/dL (ref 65–99)
Potassium: 3.6 mmol/L (ref 3.5–5.2)
Sodium: 142 mmol/L (ref 134–144)
Total Bilirubin: 1.7 mg/dL — ABNORMAL HIGH (ref 0.0–1.2)
Total Protein: 6.1 g/dL (ref 6.0–8.5)

## 2013-09-11 LAB — CBC WITH DIFFERENTIAL/PLATELET
Basophils Absolute: 0 10*3/uL (ref 0.0–0.2)
Basos: 1 %
Eos: 1 %
Eosinophils Absolute: 0.1 10*3/uL (ref 0.0–0.4)
HCT: 42.9 % (ref 34.0–46.6)
Hemoglobin: 14 g/dL (ref 11.1–15.9)
Immature Grans (Abs): 0 10*3/uL (ref 0.0–0.1)
Immature Granulocytes: 0 %
Lymphocytes Absolute: 1.2 10*3/uL (ref 0.7–3.1)
Lymphs: 25 %
MCH: 31 pg (ref 26.6–33.0)
MCHC: 32.6 g/dL (ref 31.5–35.7)
MCV: 95 fL (ref 79–97)
Monocytes Absolute: 0.5 10*3/uL (ref 0.1–0.9)
Monocytes: 11 %
Neutrophils Absolute: 3 10*3/uL (ref 1.4–7.0)
Neutrophils Relative %: 62 %
RBC: 4.51 x10E6/uL (ref 3.77–5.28)
RDW: 16 % — ABNORMAL HIGH (ref 12.3–15.4)
WBC: 4.8 10*3/uL (ref 3.4–10.8)

## 2013-09-11 LAB — CLOSTRIDIUM DIFFICILE BY PCR: Toxigenic C. Difficile by PCR: POSITIVE — AB

## 2013-09-12 ENCOUNTER — Encounter (HOSPITAL_COMMUNITY): Payer: Self-pay | Admitting: Emergency Medicine

## 2013-09-12 ENCOUNTER — Inpatient Hospital Stay (HOSPITAL_COMMUNITY)
Admission: EM | Admit: 2013-09-12 | Discharge: 2013-09-15 | DRG: 371 | Disposition: A | Discharge status: Home Health | Payer: Medicare Other | Attending: Internal Medicine | Admitting: Internal Medicine

## 2013-09-12 ENCOUNTER — Emergency Department (HOSPITAL_COMMUNITY): Payer: Medicare Other

## 2013-09-12 DIAGNOSIS — I5023 Acute on chronic systolic (congestive) heart failure: Secondary | ICD-10-CM

## 2013-09-12 DIAGNOSIS — E785 Hyperlipidemia, unspecified: Secondary | ICD-10-CM | POA: Diagnosis present

## 2013-09-12 DIAGNOSIS — T360X5A Adverse effect of penicillins, initial encounter: Secondary | ICD-10-CM | POA: Diagnosis present

## 2013-09-12 DIAGNOSIS — I38 Endocarditis, valve unspecified: Secondary | ICD-10-CM

## 2013-09-12 DIAGNOSIS — K219 Gastro-esophageal reflux disease without esophagitis: Secondary | ICD-10-CM

## 2013-09-12 DIAGNOSIS — I498 Other specified cardiac arrhythmias: Secondary | ICD-10-CM | POA: Diagnosis present

## 2013-09-12 DIAGNOSIS — I509 Heart failure, unspecified: Secondary | ICD-10-CM | POA: Diagnosis present

## 2013-09-12 DIAGNOSIS — M069 Rheumatoid arthritis, unspecified: Secondary | ICD-10-CM | POA: Diagnosis present

## 2013-09-12 DIAGNOSIS — G2581 Restless legs syndrome: Secondary | ICD-10-CM | POA: Diagnosis present

## 2013-09-12 DIAGNOSIS — Z8249 Family history of ischemic heart disease and other diseases of the circulatory system: Secondary | ICD-10-CM

## 2013-09-12 DIAGNOSIS — Z881 Allergy status to other antibiotic agents status: Secondary | ICD-10-CM

## 2013-09-12 DIAGNOSIS — R5381 Other malaise: Secondary | ICD-10-CM | POA: Diagnosis present

## 2013-09-12 DIAGNOSIS — T826XXA Infection and inflammatory reaction due to cardiac valve prosthesis, initial encounter: Secondary | ICD-10-CM | POA: Diagnosis present

## 2013-09-12 DIAGNOSIS — E876 Hypokalemia: Secondary | ICD-10-CM | POA: Diagnosis not present

## 2013-09-12 DIAGNOSIS — Z7901 Long term (current) use of anticoagulants: Secondary | ICD-10-CM

## 2013-09-12 DIAGNOSIS — N179 Acute kidney failure, unspecified: Secondary | ICD-10-CM | POA: Diagnosis present

## 2013-09-12 DIAGNOSIS — IMO0002 Reserved for concepts with insufficient information to code with codable children: Secondary | ICD-10-CM

## 2013-09-12 DIAGNOSIS — A0472 Enterocolitis due to Clostridium difficile, not specified as recurrent: Principal | ICD-10-CM

## 2013-09-12 DIAGNOSIS — D649 Anemia, unspecified: Secondary | ICD-10-CM | POA: Diagnosis present

## 2013-09-12 DIAGNOSIS — I129 Hypertensive chronic kidney disease with stage 1 through stage 4 chronic kidney disease, or unspecified chronic kidney disease: Secondary | ICD-10-CM | POA: Diagnosis present

## 2013-09-12 DIAGNOSIS — Z951 Presence of aortocoronary bypass graft: Secondary | ICD-10-CM

## 2013-09-12 DIAGNOSIS — E860 Dehydration: Secondary | ICD-10-CM | POA: Diagnosis present

## 2013-09-12 DIAGNOSIS — I4891 Unspecified atrial fibrillation: Secondary | ICD-10-CM

## 2013-09-12 DIAGNOSIS — N183 Chronic kidney disease, stage 3 unspecified: Secondary | ICD-10-CM

## 2013-09-12 DIAGNOSIS — Z885 Allergy status to narcotic agent status: Secondary | ICD-10-CM

## 2013-09-12 DIAGNOSIS — R627 Adult failure to thrive: Secondary | ICD-10-CM

## 2013-09-12 DIAGNOSIS — E46 Unspecified protein-calorie malnutrition: Secondary | ICD-10-CM | POA: Diagnosis present

## 2013-09-12 DIAGNOSIS — T45515A Adverse effect of anticoagulants, initial encounter: Secondary | ICD-10-CM | POA: Diagnosis present

## 2013-09-12 DIAGNOSIS — Z9981 Dependence on supplemental oxygen: Secondary | ICD-10-CM

## 2013-09-12 DIAGNOSIS — D696 Thrombocytopenia, unspecified: Secondary | ICD-10-CM | POA: Diagnosis present

## 2013-09-12 DIAGNOSIS — R233 Spontaneous ecchymoses: Secondary | ICD-10-CM | POA: Diagnosis present

## 2013-09-12 DIAGNOSIS — Z8619 Personal history of other infectious and parasitic diseases: Secondary | ICD-10-CM | POA: Diagnosis present

## 2013-09-12 DIAGNOSIS — M329 Systemic lupus erythematosus, unspecified: Secondary | ICD-10-CM | POA: Diagnosis present

## 2013-09-12 DIAGNOSIS — E43 Unspecified severe protein-calorie malnutrition: Secondary | ICD-10-CM

## 2013-09-12 DIAGNOSIS — I2510 Atherosclerotic heart disease of native coronary artery without angina pectoris: Secondary | ICD-10-CM | POA: Diagnosis present

## 2013-09-12 LAB — CBC
HCT: 43.6 % (ref 36.0–46.0)
HEMOGLOBIN: 14.4 g/dL (ref 12.0–15.0)
MCH: 31.2 pg (ref 26.0–34.0)
MCHC: 33 g/dL (ref 30.0–36.0)
MCV: 94.6 fL (ref 78.0–100.0)
Platelets: 76 10*3/uL — ABNORMAL LOW (ref 150–400)
RBC: 4.61 MIL/uL (ref 3.87–5.11)
RDW: 16 % — ABNORMAL HIGH (ref 11.5–15.5)
WBC: 5.4 10*3/uL (ref 4.0–10.5)

## 2013-09-12 LAB — BASIC METABOLIC PANEL
BUN: 26 mg/dL — AB (ref 6–23)
CALCIUM: 9.3 mg/dL (ref 8.4–10.5)
CO2: 26 meq/L (ref 19–32)
CREATININE: 1.18 mg/dL — AB (ref 0.50–1.10)
Chloride: 99 mEq/L (ref 96–112)
GFR calc Af Amer: 51 mL/min — ABNORMAL LOW (ref >90)
GFR calc non Af Amer: 44 mL/min — ABNORMAL LOW (ref >90)
GLUCOSE: 81 mg/dL (ref 70–99)
Potassium: 3.6 mEq/L — ABNORMAL LOW (ref 3.7–5.3)
Sodium: 140 mEq/L (ref 137–147)

## 2013-09-12 LAB — URINALYSIS, ROUTINE W REFLEX MICROSCOPIC
Bilirubin Urine: NEGATIVE
GLUCOSE, UA: NEGATIVE mg/dL
KETONES UR: NEGATIVE mg/dL
Leukocytes, UA: NEGATIVE
Nitrite: NEGATIVE
Protein, ur: 30 mg/dL — AB
Specific Gravity, Urine: 1.013 (ref 1.005–1.030)
Urobilinogen, UA: 1 mg/dL (ref 0.0–1.0)
pH: 5.5 (ref 5.0–8.0)

## 2013-09-12 LAB — PRO B NATRIURETIC PEPTIDE: PRO B NATRI PEPTIDE: 16828 pg/mL — AB (ref 0–450)

## 2013-09-12 LAB — PROTIME-INR
INR: 8.33 (ref 0.00–1.49)
Prothrombin Time: 65.6 seconds — ABNORMAL HIGH (ref 11.6–15.2)

## 2013-09-12 LAB — CBG MONITORING, ED: Glucose-Capillary: 81 mg/dL (ref 70–99)

## 2013-09-12 LAB — SODIUM, URINE, RANDOM: Sodium, Ur: 87 mEq/L

## 2013-09-12 LAB — URINE MICROSCOPIC-ADD ON

## 2013-09-12 LAB — TROPONIN I

## 2013-09-12 LAB — DIGOXIN LEVEL: Digoxin Level: 2 ng/mL (ref 0.8–2.0)

## 2013-09-12 LAB — CK: Total CK: 87 U/L (ref 7–177)

## 2013-09-12 MED ORDER — METRONIDAZOLE 500 MG PO TABS
500.0000 mg | ORAL_TABLET | Freq: Once | ORAL | Status: AC
  Administered 2013-09-12: 500 mg via ORAL
  Filled 2013-09-12: qty 1

## 2013-09-12 MED ORDER — POTASSIUM CHLORIDE CRYS ER 20 MEQ PO TBCR
40.0000 meq | EXTENDED_RELEASE_TABLET | Freq: Once | ORAL | Status: AC
  Administered 2013-09-12: 40 meq via ORAL
  Filled 2013-09-12: qty 2

## 2013-09-12 MED ORDER — BUPRENORPHINE 5 MCG/HR TD PTWK
5.0000 ug | MEDICATED_PATCH | TRANSDERMAL | Status: DC
  Administered 2013-09-12: 5 ug via TRANSDERMAL
  Filled 2013-09-12: qty 1

## 2013-09-12 MED ORDER — GABAPENTIN 300 MG PO CAPS
300.0000 mg | ORAL_CAPSULE | Freq: Every day | ORAL | Status: DC
  Administered 2013-09-12 – 2013-09-14 (×3): 300 mg via ORAL
  Filled 2013-09-12 (×4): qty 1

## 2013-09-12 MED ORDER — ONDANSETRON HCL 4 MG PO TABS: 4.0000 mg | ORAL_TABLET | Freq: Four times a day (QID) | ORAL | Status: DC | PRN

## 2013-09-12 MED ORDER — PREDNISONE 5 MG PO TABS
5.0000 mg | ORAL_TABLET | Freq: Every morning | ORAL | Status: DC
  Administered 2013-09-12 – 2013-09-15 (×4): 5 mg via ORAL
  Filled 2013-09-12 (×4): qty 1

## 2013-09-12 MED ORDER — KCL IN DEXTROSE-NACL 20-5-0.9 MEQ/L-%-% IV SOLN: INTRAVENOUS | Status: DC

## 2013-09-12 MED ORDER — BUPRENORPHINE 5 MCG/HR TD PTWK: 5.0000 ug | MEDICATED_PATCH | TRANSDERMAL | Status: DC

## 2013-09-12 MED ORDER — METOPROLOL TARTRATE 25 MG PO TABS: 25.0000 mg | ORAL_TABLET | Freq: Two times a day (BID) | ORAL | Status: DC

## 2013-09-12 MED ORDER — ALUM & MAG HYDROXIDE-SIMETH 200-200-20 MG/5ML PO SUSP: 30.0000 mL | Freq: Four times a day (QID) | ORAL | Status: DC | PRN

## 2013-09-12 MED ORDER — SODIUM CHLORIDE 0.9 % IV SOLN
INTRAVENOUS | Status: AC
  Administered 2013-09-12: 18:00:00 via INTRAVENOUS

## 2013-09-12 MED ORDER — METOPROLOL TARTRATE 25 MG PO TABS
25.0000 mg | ORAL_TABLET | Freq: Two times a day (BID) | ORAL | Status: DC
  Filled 2013-09-12 (×3): qty 1

## 2013-09-12 MED ORDER — DIGOXIN 125 MCG PO TABS
0.1250 mg | ORAL_TABLET | Freq: Every morning | ORAL | Status: DC
  Filled 2013-09-12: qty 1

## 2013-09-12 MED ORDER — WARFARIN SODIUM 4 MG PO TABS: 4.0000 mg | ORAL_TABLET | Freq: Every day | ORAL | Status: DC

## 2013-09-12 MED ORDER — ONDANSETRON HCL 4 MG/2ML IJ SOLN: 4.0000 mg | Freq: Four times a day (QID) | INTRAMUSCULAR | Status: DC | PRN

## 2013-09-12 MED ORDER — FUROSEMIDE 10 MG/ML IJ SOLN
60.0000 mg | Freq: Once | INTRAMUSCULAR | Status: AC
  Administered 2013-09-12: 60 mg via INTRAVENOUS
  Filled 2013-09-12: qty 6

## 2013-09-12 MED ORDER — SACCHAROMYCES BOULARDII 250 MG PO CAPS
250.0000 mg | ORAL_CAPSULE | Freq: Two times a day (BID) | ORAL | Status: DC
  Administered 2013-09-12 – 2013-09-15 (×6): 250 mg via ORAL
  Filled 2013-09-12 (×7): qty 1

## 2013-09-12 MED ORDER — MIRTAZAPINE 7.5 MG PO TABS
7.5000 mg | ORAL_TABLET | Freq: Every day | ORAL | Status: DC
  Administered 2013-09-12 – 2013-09-14 (×3): 7.5 mg via ORAL
  Filled 2013-09-12 (×4): qty 1

## 2013-09-12 NOTE — Progress Notes (Signed)
ANTICOAGULATION CONSULT NOTE - Initial Consult  Pharmacy Consult for coumadin Indication: atrial fibrillation  Allergies  Allergen Reactions  . Ceftriaxone     RASH BUT TOLERATED AMPICILLIN AND IMIPENEM WITHOUT PROBLEMS  . Codeine Nausea Only    Patient Measurements: Height: 5\' 6"  (167.6 cm) Weight: 138 lb 0.1 oz (62.6 kg) (bed scale) IBW/kg (Calculated) : 59.3   Vital Signs: Temp: 98.1 F (36.7 C) (06/20 2032) Temp src: Oral (06/20 2032) BP: 143/74 mmHg (06/20 2032) Pulse Rate: 64 (06/20 2032)  Labs:  Recent Labs  09/10/13 1455 09/12/13 1122 09/12/13 1828  HGB 14.0 14.4  --   HCT 42.9 43.6  --   PLT  --  76*  --   LABPROT  --   --  65.6*  INR  --   --  8.33*  CREATININE 1.46* 1.18*  --   CKTOTAL  --  87  --   TROPONINI  --  <0.30  --     Estimated Creatinine Clearance: 38.6 ml/min (by C-G formula based on Cr of 1.18).   Medical History: Past Medical History  Diagnosis Date  . Coronary artery disease   . Atrial fibrillation     Now NSR  . GERD (gastroesophageal reflux disease)   . Hyperlipidemia   . 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 Anemia   . Angina   . Hypertension   . Blood transfusion     "related to bad case of bronchitis once; 1 yr ago given due to GIB" (12/17/2012)  . H/O hiatal hernia   . Lupus     "that's compromised her lungs somewhat" (12/17/2012)  . Insomnia   . Endocarditis   . Prosthetic valve endocarditis   . Enterococcal bacteremia   . Heart murmur   . CHF (congestive heart failure)   . Pneumonia     "once or twice" (12/17/2012)  . Chronic bronchitis     "used to get it once/yr; hasn't had it since ~ 1986" (12/17/2012)  . Shortness of breath     "all the times sometimes" (12/17/2012)  . On home oxygen therapy     "2L prn; always at night" (12/17/2012)  . Lower GI bleed ~ 01/2012  . Arthritis     "hands" (12/17/2012)  . 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  INR now back supratherapeutic at 8.33.  Per RN, no bleeding noted.   Goal of Therapy:  INR 2-3    Plan:  1) hold coumadin for now 2) f/u INR in AM   Jeramy Dimmick P 09/12/2013,8:39 PM

## 2013-09-12 NOTE — ED Notes (Signed)
Patient transported to X-ray 

## 2013-09-12 NOTE — ED Notes (Signed)
Pt is cooperative. PO fluids given pt asked for UA

## 2013-09-12 NOTE — ED Notes (Signed)
Attempted to call report

## 2013-09-12 NOTE — ED Notes (Signed)
Pt placed on contact precautions per Hospitalist request

## 2013-09-12 NOTE — Consult Note (Signed)
Referring Physician: Charolette Forward, MD  [name] is an 73 y.o. female.                       Chief Complaint: Weakness/difficulty ambulating/Abnormal weight loss HPI: 73 year old female with PMH of Lupus, chronic endocarditis on Amoxil, HTN, and anorexia has difficulty walking for 4-5 days. She has had diarrhea for 2 months and some occasional vomiting (once or twice a week). She also has weight loss of 10lbs over the last 18months and 100lbs over the last year. She also had leg edema improved by doubling dose of lasix 2 weeks ago. Elevated Pro-BNP of 16,828. Chest x-ray negative for pulmonary edema.    Past Medical History  Diagnosis Date  . Coronary artery disease   . Atrial fibrillation     Now NSR  . GERD (gastroesophageal reflux disease)   . Hyperlipidemia   . 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 Anemia   . Angina   . Hypertension   . Blood transfusion     "related to bad case of bronchitis once; 1 yr ago given due to GIB" (12/17/2012)  . H/O hiatal hernia   . Lupus     "that's compromised her lungs somewhat" (12/17/2012)  . Insomnia   . Endocarditis   . Prosthetic valve endocarditis   . Enterococcal bacteremia   . Heart murmur   . CHF (congestive heart failure)   . Pneumonia     "once or twice" (12/17/2012)  . Chronic bronchitis     "used to get it once/yr; hasn't had it since ~ 1986" (12/17/2012)  . Shortness of breath     "all the times sometimes" (12/17/2012)  . On home oxygen therapy     "2L prn; always at night" (12/17/2012)  . Lower GI bleed ~ 01/2012  . Arthritis     "hands" (12/17/2012)  . Bright's disease 1942    "hospitalized for 2 wks" (12/17/2012)      Past Surgical History  Procedure Laterality Date  . Givens capsule study  04/09/2011    Procedure: GIVENS CAPSULE STUDY;  Surgeon: Beryle Beams, MD;  Location: [place];  Service: Endoscopy;  Laterality: N/A;  . Tee without cardioversion  02/06/2012    Procedure:  TRANSESOPHAGEAL ECHOCARDIOGRAM (TEE);  Surgeon: Birdie Riddle, MD;  Location: [facility] ENDOSCOPY;  Service: Cardiovascular;  Laterality: N/A;  . Esophagogastroduodenoscopy  02/12/2012    Procedure: ESOPHAGOGASTRODUODENOSCOPY (EGD);  Surgeon: Beryle Beams, MD;  Location: [facility] ENDOSCOPY;  Service: Endoscopy;  Laterality: N/A;  . Tee without cardioversion N/A 07/15/2012    Procedure: TRANSESOPHAGEAL ECHOCARDIOGRAM (TEE);  Surgeon: Birdie Riddle, MD;  Location: [facility] ENDOSCOPY;  Service: Cardiovascular;  Laterality: N/A;  . Breast lumpectomy Left 1977    "benign" (12/17/2012)  . Tonsillectomy  1940's  . Coronary artery bypass graft  2006  . 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   . Cardiac catheterization      "probably 2-3" (12/17/2012)  . Coronary angioplasty with stent placement      "several put in over the years" (12/17/2012)  . Cataract extraction w/ intraocular lens  implant, bilateral Bilateral 11/2000  . Peripherally inserted central catheter insertion Right 12/15/2012    "upper arm" (12/17/2012    Family History  Problem Relation Age of Onset  . Heart disease Mother     CHF  . Mental illness Father  suicide  . Cancer Sister     pancreatic cancer  . COPD Brother    Social History:  reports that she has never smoked. She has never used smokeless tobacco. She reports that she does not drink alcohol or use illicit drugs.  Allergies:  Allergies  Allergen Reactions  . Ceftriaxone     RASH BUT TOLERATED AMPICILLIN AND IMIPENEM WITHOUT PROBLEMS  . Codeine Nausea Only     (Not in a hospital admission)  Results for orders placed during the hospital encounter of 09/12/13 (from the past 48 hour(s))  CBC     Status: Abnormal   Collection Time    09/12/13 11:22 AM      Result Value Ref Range   WBC 5.4  4.0 - 10.5 K/uL   RBC 4.61  3.87 - 5.11 MIL/uL   Hemoglobin 14.4  12.0 - 15.0 g/dL   HCT 43.6  36.0 - 46.0 %   MCV 94.6  78.0 - 100.0 fL   MCH 31.2   26.0 - 34.0 pg   MCHC 33.0  30.0 - 36.0 g/dL   RDW 16.0 (*) 11.5 - 15.5 %   Platelets 76 (*) 150 - 400 K/uL   Comment: PLATELET COUNT CONFIRMED BY SMEAR  BASIC METABOLIC PANEL     Status: Abnormal   Collection Time    09/12/13 11:22 AM      Result Value Ref Range   Sodium 140  137 - 147 mEq/L   Potassium 3.6 (*) 3.7 - 5.3 mEq/L   Chloride 99  96 - 112 mEq/L   CO2 26  19 - 32 mEq/L   Glucose, Bld 81  70 - 99 mg/dL   BUN 26 (*) 6 - 23 mg/dL   Creatinine, Ser 1.18 (*) 0.50 - 1.10 mg/dL   Calcium 9.3  8.4 - 10.5 mg/dL   GFR calc non Af Amer 44 (*) >90 mL/min   GFR calc Af Amer 51 (*) >90 mL/min   Comment: (NOTE)     The eGFR has been calculated using the CKD EPI equation.     This calculation has not been validated in all clinical situations.     eGFR's persistently <90 mL/min signify possible Chronic Kidney     Disease.  PRO B NATRIURETIC PEPTIDE     Status: Abnormal   Collection Time    09/12/13 11:22 AM      Result Value Ref Range   Pro B Natriuretic peptide (BNP) 16828.0 (*) 0 - 450 pg/mL  TROPONIN I     Status: None   Collection Time    09/12/13 11:22 AM      Result Value Ref Range   Troponin I <0.30  <0.30 ng/mL   Comment:            Due to the release kinetics of cTnI,     a negative result within the first hours     of the onset of symptoms does not rule out     myocardial infarction with certainty.     If myocardial infarction is still suspected,     repeat the test at appropriate intervals.  CK     Status: None   Collection Time    09/12/13 11:22 AM      Result Value Ref Range   Total CK 87  7 - 177 U/L  CBG MONITORING, ED     Status: None   Collection Time    09/12/13 11:39 AM  Result Value Ref Range   Glucose-Capillary 81  70 - 99 mg/dL  URINALYSIS, ROUTINE W REFLEX MICROSCOPIC     Status: Abnormal   Collection Time    09/12/13 12:49 PM      Result Value Ref Range   Color, Urine YELLOW  YELLOW   APPearance CLEAR  CLEAR   Specific Gravity, Urine  1.013  1.005 - 1.030   pH 5.5  5.0 - 8.0   Glucose, UA NEGATIVE  NEGATIVE mg/dL   Hgb urine dipstick MODERATE (*) NEGATIVE   Bilirubin Urine NEGATIVE  NEGATIVE   Ketones, ur NEGATIVE  NEGATIVE mg/dL   Protein, ur 30 (*) NEGATIVE mg/dL   Urobilinogen, UA 1.0  0.0 - 1.0 mg/dL   Nitrite NEGATIVE  NEGATIVE   Leukocytes, UA NEGATIVE  NEGATIVE  URINE MICROSCOPIC-ADD ON     Status: Abnormal   Collection Time    09/12/13 12:49 PM      Result Value Ref Range   Squamous Epithelial / LPF RARE  RARE   WBC, UA 0-2  <3 WBC/hpf   RBC / HPF 0-2  <3 RBC/hpf   Casts HYALINE CASTS (*) NEGATIVE   Dg Chest 2 View  09/12/2013   CLINICAL DATA:  Shortness of breath  EXAM: CHEST  2 VIEW  COMPARISON:  07/21/2012  FINDINGS: Chronic cardiopericardial enlargement with notable pulmonary arterial enlargement, likely from pulmonary hypertension. Bulky mitral annular calcification. Remote median sternotomy, likely for CABG. Coronary stents noted.  There is elevation the anterior right diaphragm, often from previous median sternotomy. No consolidation, edema, effusion, or pneumothorax.  IMPRESSION: No active cardiopulmonary disease.   Electronically Signed   By: Jorje Guild M.D.   On: 09/12/2013 13:13   Review of Systems  Constitutional: Negative for fever.  Eyes: Negative for blurred vision and double vision.  Respiratory: Negative for cough, hemoptysis, sputum production but exertional shortness of breath.  Cardiovascular: Positive for leg swelling. Negative for chest pain, palpitations and orthopnea.  Gastrointestinal: Negative for nausea, vomiting and abdominal pain.  Genitourinary: Negative for dysuria.  Neurological: Negative for dizziness and headaches.   Blood pressure 138/51, pulse 50, temperature 98.8 F (37.1 C), resp. rate 18, height $RemoveBe'5\' 6"'[id number]$  (1.676 m), weight 63.957 kg (141 lb), SpO2 91.00%.  Physical Exam  Constitutional: She appears well-developed and averagely-nourished.  HENT: Normocephalic and  atraumatic. No oropharyngeal exudate. Eyes: Conjunctivae normal are normal. Pupils are equal, round, and reactive to light. No scleral icterus.  Neck: Normal range of motion. Neck supple. + JVD at 20 degree angle present. No tracheal deviation present. No thyromegaly present.  Cardiovascular: Irregularly irregular S1-S2 soft. soft systolic and diastolic murmur noted no S3 gallop  Respiratory: Effort normal and breath sounds normal. No respiratory distress. She has no wheezes. She has no rales.  GI: Soft. Bowel sounds are normal. She exhibits no distension. There is no tenderness. There is no rebound.  Musculoskeletal: No clubbing or cyanosis. 1+ edema lower extremity. Neurological: She is alert and oriented to person and place. Moves all 4 extremities in bed.   Assessment/Plan Acute left heart systolic failure Chronic endocarditis with valvular hear disease C. difficile colitis  Dehydration Weakness Chronic renal insufficiency  Gentle hydration. Agee with Echocardiogram and home medications like coumadin and digoxin. IV albumin may help renal perfusion and blood pressure.   Shaune Malacara S 09/12/2013, 3:14 PM

## 2013-09-12 NOTE — H&P (Addendum)
[facility] History and Physical  [name] JQB:[id number] DOB: 07-Nov-1940 DOA: 09/12/2013  Referring physician: Blanchie Dessert PCP: Hollace Kinnier, DO  Specialists: Dr Ouida Sills ([place])  Chief Complaint: unable to walk for 4-5 days  HPI: [name] is a 73 y.o. 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 She is brought in by her daughter due to not walking 4-5 days. She has had diarrhea for 2 months and some occasional vomiting (once or twice a week). Diarrhea is very bad now and she is incontinent of stool. Her PCP perfomed a C diff PCR on 6 /16 which is found to be positive by the ER doc today.  Furthermore, she has worsening pedal edema. Lasix was prescribed by Dr Clover Mealy as outpt. Per the daughter and her girlfriend, she suspected the edema was coming from protein malnutrition. It helped the edema initially but over the past few days, despite laying in be (keeping feet up) they are more swollen.  In addition, she has a new diastolic murmur and there are concerns by her PCP for worsening endocarditis. Her cardiologist is Dr Terrence Dupont.      General: The patient denies anorexia,  weight loss " never runs a fever" even when she has infections Cardiac: Denies chest pain, syncope, palpitations + pedal edema  Respiratory: + dyspnea on exertion no cough, shortness of breath, wheezing  GI: Denies severe indigestion/heartburn, abdominal pain, +nausea, occasional vomiting and diarrhea  GU: Denies hematuria, dysuria + incontinence Musculoskeletal: arthritis in knees Skin: Denies suspicious skin lesions, bruises easily  Neurologic: Denies focal weakness or numbness, change in vision  Past Medical History  Diagnosis Date  . Coronary artery disease   . Atrial fibrillation     Now NSR  . GERD (gastroesophageal reflux disease)   . Hyperlipidemia   . 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 Anemia   . Angina   . Hypertension   . Blood transfusion     "related to bad case of bronchitis once; 1 yr ago given due to GIB" (12/17/2012)  . H/O hiatal hernia   . Lupus     "that's compromised her lungs somewhat" (12/17/2012)  . Insomnia   . Endocarditis   . Prosthetic valve endocarditis   . Enterococcal bacteremia   . Heart murmur   . CHF (congestive heart failure)   . Pneumonia     "once or twice" (12/17/2012)  . Chronic bronchitis     "used to get it once/yr; hasn't had it since ~ 1986" (12/17/2012)  . Shortness of breath     "all the times sometimes" (12/17/2012)  . On home oxygen therapy     "2L prn; always at night" (12/17/2012)  . Lower GI bleed ~ 01/2012  . Arthritis     "hands" (12/17/2012)  . 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 Givens capsule study  04/09/2011    Procedure: GIVENS CAPSULE STUDY;  Surgeon: Beryle Beams, MD;  Location: [facility];  Service: Endoscopy;  Laterality: N/A;  . Tee without cardioversion  02/06/2012    Procedure: TRANSESOPHAGEAL ECHOCARDIOGRAM (TEE);  Surgeon: Birdie Riddle, MD;  Location: [facility] ENDOSCOPY;  Service: Cardiovascular;  Laterality: N/A;  . Esophagogastroduodenoscopy  02/12/2012    Procedure: ESOPHAGOGASTRODUODENOSCOPY (EGD);  Surgeon: Beryle Beams, MD;  Location: [facility] ENDOSCOPY;  Service: Endoscopy;  Laterality: N/A;  . Tee without cardioversion N/A 07/15/2012    Procedure: TRANSESOPHAGEAL ECHOCARDIOGRAM (TEE);  Surgeon: Birdie Riddle, MD;  Location: [facility] ENDOSCOPY;  Service: Cardiovascular;  Laterality: N/A;  . Breast lumpectomy Left 1977    "benign" (12/17/2012)  . Tonsillectomy  1940's  . Coronary artery bypass graft  2006  . 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   . Cardiac catheterization      "probably 2-3" (12/17/2012)  . Coronary angioplasty with stent placement      "several  put in over the years" (12/17/2012)  . Cataract extraction w/ intraocular lens  implant, bilateral Bilateral 11/2000  . Peripherally inserted central catheter insertion Right 12/15/2012    "upper arm" (12/17/2012   Social History:  reports that she has never smoked. She has never used smokeless tobacco. She reports that she does not drink alcohol or use illicit drugs. Lives at home with daughter- [name] Was doing good with  ADLs  Allergies  Allergen Reactions  . Ceftriaxone     RASH BUT TOLERATED AMPICILLIN AND IMIPENEM WITHOUT PROBLEMS  . Codeine Nausea Only    Family History  Problem Relation Age of Onset  . Heart disease Mother     CHF  . Mental illness Father     suicide  . Cancer Sister     pancreatic cancer  . COPD Brother       Prior to Admission medications   Medication Sig Start Date End Date Taking? Authorizing Zeke Aker  digoxin (LANOXIN) 0.125 MG tablet Take 0.125 mg by mouth every morning.   Yes Historical Amberlynn Tempesta, MD  furosemide (LASIX) 20 MG tablet Take 20 mg by mouth 2 (two) times daily. 12/20/12  Yes Maryann Mikhail, DO  gabapentin (NEURONTIN) 300 MG capsule Take 300 mg by mouth at bedtime.   Yes Historical Aoife Bold, MD  hydroxychloroquine (PLAQUENIL) 200 MG tablet Take 200 mg by mouth 2 (two) times daily.    Yes Historical Kytzia Gienger, MD  IRON PO Take 1 tablet by mouth 2 (two) times daily.   Yes Historical Maryann Mccall, MD  metoprolol (LOPRESSOR) 50 MG tablet Take 0.5 tablets (25 mg total) by mouth 2 (two) times daily. 07/22/12  Yes Clent Demark, MD  mirtazapine (REMERON) 7.5 MG tablet Take 1 tablet (7.5 mg total) by mouth at bedtime. For appetite 08/31/13  Yes Tiffany L Reed, DO  pantoprazole (PROTONIX) 40 MG tablet Take 40 mg by mouth every morning.   Yes Historical Cherre Kothari, MD  potassium chloride SA (K-DUR,KLOR-CON) 20 MEQ tablet Take 40-60 mEq by mouth 2 (two) times daily. Takes 2 tablets every morning and takes 3 tablets every evening.   Yes Historical Akeel Reffner, MD   predniSONE (DELTASONE) 5 MG tablet Take 1 tablet (5 mg total) by mouth every morning. 08/07/12  Yes Elsie Stain, MD  rosuvastatin (CRESTOR) 10 MG tablet Take 10 mg by mouth every morning.    Yes Historical Melannie Metzner, MD  saccharomyces boulardii (FLORASTOR) 250 MG capsule Take 1 capsule (250 mg total) by mouth 2 (two) times daily. 08/31/13  Yes Tiffany L Reed, DO  warfarin (COUMADIN) 4 MG tablet Take 4-6 mg by mouth daily. Takes 4mg  (1tablet) daily, except Wednesday, Friday and Sunday, then takes 6mg  (1 1/2 tablets)   Yes Historical Kinley Dozier, MD  buprenorphine (BUTRANS) 5 MCG/HR PTWK patch Place 1 patch (5 mcg total) onto the skin once a week. 07/03/13   Gayland Curry, DO  ciprofloxacin (CIPRO) 500 MG tablet Take 500 mg by mouth 2 (two) times daily. 7 day course completed on 6/17 for UTI    Historical Pratik Dalziel, MD  zoster vaccine live, PF, (ZOSTAVAX) 37106 UNT/0.65ML injection Inject 19,400 Units into the skin once. 07/27/13   Gayland Curry, DO     Physical Exam: Vitals Filed Vitals:   09/12/13 1415 09/12/13 1445 09/12/13 1500 09/12/13 1601  BP:   138/51 116/72  Pulse: 33 65 50 67  Temp:    98.1 F (36.7 C)  TempSrc:    Oral  Resp: 17 26 18 20   Height:    5\' 6"  (1.676 m)  Weight:    62.6 kg (138 lb 0.1 oz)  SpO2: 79% 88% 91% 95%      General: Awake and alert, confused about time(month and year) otherwise well oriented to situation  HEENT: Normocephalic and Atraumatic, Mucous membranes dry                PERRLA; EOM intact; No scleral icterus,                 Nares: Patent, Oropharynx: Clear                Neck: FROM, no cervical lymphadenopathy, thyromegaly, carotid bruit or JVD;  Breasts: deferred CHEST WALL: No tenderness  CHEST: Normal respiration, clear to auscultation bilaterally  HEART: Regular rate and rhythm; + murmur at mitral and aortic area, no rubs or gallops  BACK: No kyphosis or scoliosis; no CVA tenderness  ABDOMEN: Positive Bowel Sounds, soft, non-tender; no  masses, no organomegaly Rectal Exam: deferred EXTREMITIES: No cyanosis, clubbing, +2 edema in legs Genitalia: not examined  SKIN:  no rash or ulceration - petechia noted on legs CNS: Alert and Oriented x 4, Nonfocal exam, CN 2-12 intact  Labs on Admission:  Basic Metabolic Panel:  Recent Labs Lab 09/10/13 1455 09/12/13 1122  NA 142 140  K 3.6 3.6*  CL 101 99  CO2 25 26  GLUCOSE 91 81  BUN 29* 26*  CREATININE 1.46* 1.18*  CALCIUM 9.0 9.3   Liver Function Tests:  Recent Labs Lab 09/10/13 1455  AST 20  ALT 10  ALKPHOS 101  BILITOT 1.7*  PROT 6.1   No results found for this basename: LIPASE, AMYLASE,  in the last 168 hours No results found for this basename: AMMONIA,  in the last 168 hours CBC:  Recent Labs Lab 09/10/13 1455 09/12/13 1122  WBC 4.8 5.4  NEUTROABS 3.0  --   HGB 14.0 14.4  HCT 42.9 43.6  MCV 95 94.6  PLT  --  76*   Cardiac Enzymes:  Recent Labs Lab 09/12/13 1122  CKTOTAL 87  TROPONINI <0.30    BNP (last 3 results)  Recent Labs  09/12/13 1122  PROBNP 16828.0*   CBG:  Recent Labs Lab 09/12/13 1139  GLUCAP 81    Radiological Exams on Admission: Dg Chest 2 View  09/12/2013   CLINICAL DATA:  Shortness of breath  EXAM: CHEST  2 VIEW  COMPARISON:  07/21/2012  FINDINGS: Chronic cardiopericardial enlargement with notable pulmonary arterial enlargement, likely from pulmonary hypertension. Bulky mitral annular calcification. Remote median sternotomy, likely for CABG. Coronary stents noted.  There is elevation the anterior right diaphragm, often from previous median sternotomy. No consolidation, edema, effusion, or pneumothorax.  IMPRESSION: No active cardiopulmonary disease.   Electronically Signed   By: Jorje Guild M.D.   On: 09/12/2013 13:13    EKG: Independently  reviewed. A-fib 60s  Assessment/Plan Principal Problem:   C. difficile colitis - start oral flagyl and follow for tolerance - likely due to being on chronic amoxil and  having recently received a course of Cipro (for a UTI?) Active Problems:    RESTLESS LEG SYNDROME, MILD   Atrial fibrillation - bradycardic with rates dropping into 30s- - asymptomatic at rest-  hold B Blocker and Dig for HR< 50  - cont coumadin per pharmacy    Endocarditis - of prosthetic aortic valve- managed by ID - chronically on Amoxil- will need to continue - will evaluate new murmur with ECHO- may need TEE  Pedal edema  - suspect from severe pro cal malnutrition-ER doc has ordered Lasix which has already been given- Dr Doylene Canard agrees with one dose of Lasix - she appears per lab work to be intravascularly depleted to me- hyaline casts in urine support this diagnosis-      Lupus - cont Plaquenil and low dose Prednisone- no need for stress dose steroids at this dose of Prednisone    AKI on CKD (chronic kidney disease) stage 3, GFR 30-59 ml/min - has been on Lasix at home- follow levels with further diuresis  Severe Protein calorie malnutrition - chronic issue- family states she does not eat - Nutrition eval  Thrombocytopenia/ petechiae on legs/ hematuria - follow- no major bleeding- Hgb looks good at 14 - note that Platelets were 123 on 4/10 and normal prior to that - may be due to sepsis/ colitis vs medications - recheck in AM    Consulted: Cardiology consulted by ER  Code Status: Do not intubate - per patient Family Communication:  With daughter [name] (she shares POA with Kerry Fort) and Penny's wife Disposition Plan: to be determined   Time spent: >45 min  RIZWAN,SAIMA, MD [facility]  If 7PM-7AM, please contact night-coverage [email or web address] 09/12/2013, 7:09 PM

## 2013-09-12 NOTE — Progress Notes (Signed)
Notified Dr. Wynelle Cleveland patient's heart rate dropping to 37-39 at times. Goes immediately back up. This is her normal at home per family. Lanoxin held. Will continue to monitor.

## 2013-09-12 NOTE — ED Notes (Signed)
Pt voided approx 250 ccs of dark yellow urine. Hospitalist at bedside

## 2013-09-12 NOTE — ED Notes (Addendum)
Daughter states since Wednesday the pt has not been able to get up and walk. Shes been having diarrhea for the past few months, her PCP did a stool sample and told the daughter it "was normal." the doctor also drew labs and blood cultures in the office this week. today when the daughter tried to get the pt up to potty seat she fell so they decided to bring her in. She denies any injuries from the fall, she is A&Ox4. She does have scratches to her arms she states it is from playing with her pets. She just finished abx for UTI Wednesday.

## 2013-09-12 NOTE — ED Provider Notes (Addendum)
CSN: [id number]     Arrival date & time 09/12/13  1105 History   First MD Initiated Contact with Patient 09/12/13 1147     Chief Complaint  Patient presents with  . 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  Normal pt is able to get around her home and no unable to even stand.  Family states she is eating practically nothing but they have been trying to push fluids.  She had blood cultures done at PCP office on Thursday which currently show no growth and blood work done on thurs neg for acute findings.  However pt getting worse and more weak and brought in today.  Family states in the past when pt has had bouts of endocarditis this is usually how it starts.  No fevers or rashes but 2 weeks ago developed peripheral edema in bilateral lower legs treated by doubling lasix which improved but in the last 2 days worse again.  No urinary changes.  Intermittent N/V/D and stool culture on 09/08/13 was positive for c.diff but currently not on treatment.  Pt has SOB with exertion but none at rest.  No cough.  Weight loss of 10lbs over the last 31months and 100lbs over the last year  Patient is a 73 y.o. female presenting with weakness. The history is provided by the patient and a relative.  Weakness This is a new problem. Episode onset: 3 days ago. The problem occurs constantly. The problem has been gradually worsening. Associated symptoms comments: Decreased oral intake, intermittent diarrhea and vomiting and generalized weakness to the point now where she cannot get out of bed and today when trying to get to the bedside potty she just collapsed onto the floor.  Denies any  injury from collapsing to the floor and no injury to the head.. The symptoms are aggravated by exertion, standing and walking. Nothing relieves the symptoms. She has tried rest (fluids) for the symptoms. The treatment provided no relief.    Past Medical History  Diagnosis Date  . Coronary artery disease   . Atrial fibrillation     Now NSR  . GERD (gastroesophageal reflux disease)   . Hyperlipidemia   . 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 Anemia   . Angina   . Hypertension   . Blood transfusion     "related to bad case of bronchitis once; 1 yr ago given due to GIB" (12/17/2012)  . H/O hiatal hernia   . Lupus     "that's compromised her lungs somewhat" (12/17/2012)  . Insomnia   . Endocarditis   . Prosthetic valve endocarditis   . Enterococcal bacteremia   . Heart murmur   . CHF (congestive heart failure)   . Pneumonia     "once or twice" (12/17/2012)  . Chronic bronchitis     "used to get it once/yr; hasn't had it since ~ 1986" (12/17/2012)  . Shortness of breath     "all the times sometimes" (12/17/2012)  . On home oxygen therapy     "2L prn; always at night" (12/17/2012)  . Lower GI bleed ~ 01/2012  . Arthritis     "hands" (12/17/2012)  . Bright's disease 1942    "  hospitalized for 2 wks" (12/17/2012)   Past Surgical History  Procedure Laterality Date  . Givens capsule study  04/09/2011    Procedure: GIVENS CAPSULE STUDY;  Surgeon: Beryle Beams, MD;  Location: [place];  Service: Endoscopy;  Laterality: N/A;  . Tee without cardioversion  02/06/2012    Procedure: TRANSESOPHAGEAL ECHOCARDIOGRAM (TEE);  Surgeon: Birdie Riddle, MD;  Location: [facility] ENDOSCOPY;  Service: Cardiovascular;  Laterality: N/A;  . Esophagogastroduodenoscopy  02/12/2012    Procedure: ESOPHAGOGASTRODUODENOSCOPY (EGD);  Surgeon: Beryle Beams, MD;  Location: [facility] ENDOSCOPY;  Service: Endoscopy;  Laterality: N/A;  . Tee without cardioversion N/A 07/15/2012    Procedure:  TRANSESOPHAGEAL ECHOCARDIOGRAM (TEE);  Surgeon: Birdie Riddle, MD;  Location: [facility] ENDOSCOPY;  Service: Cardiovascular;  Laterality: N/A;  . Breast lumpectomy Left 1977    "benign" (12/17/2012)  . Tonsillectomy  1940's  . Coronary artery bypass graft  2006  . 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   . Cardiac catheterization      "probably 2-3" (12/17/2012)  . Coronary angioplasty with stent placement      "several put in over the years" (12/17/2012)  . Cataract extraction w/ intraocular lens  implant, bilateral Bilateral 11/2000  . Peripherally inserted central catheter insertion Right 12/15/2012    "upper arm" (12/17/2012   Family History  Problem Relation Age of Onset  . Heart disease Mother     CHF  . Mental illness Father     suicide  . Cancer Sister     pancreatic cancer  . COPD Brother    History  Substance Use Topics  . Smoking status: Never Smoker   . Smokeless tobacco: Never Used  . Alcohol Use: No   OB History   Grav Para Term Preterm Abortions TAB SAB Ect Mult Living                 Review of Systems  Neurological: Positive for weakness.  All other systems reviewed and are negative.     Allergies  Ceftriaxone and Codeine  Home Medications   Prior to Admission medications   Medication Sig Start Date End Date Taking? Authorizing Provider  digoxin (LANOXIN) 0.125 MG tablet Take 0.125 mg by mouth every morning.   Yes Historical Provider, MD  furosemide (LASIX) 20 MG tablet Take 20 mg by mouth 2 (two) times daily. 12/20/12  Yes Maryann Mikhail, DO  gabapentin (NEURONTIN) 300 MG capsule Take 300 mg by mouth at bedtime.   Yes Historical Provider, MD  hydroxychloroquine (PLAQUENIL) 200 MG tablet Take 200 mg by mouth 2 (two) times daily.    Yes Historical Provider, MD  IRON PO Take 1 tablet by mouth 2 (two) times daily.   Yes Historical Provider, MD  metoprolol (LOPRESSOR) 50 MG tablet Take 0.5 tablets (25 mg total) by mouth 2 (two)  times daily. 07/22/12  Yes Clent Demark, MD  mirtazapine (REMERON) 7.5 MG tablet Take 1 tablet (7.5 mg total) by mouth at bedtime. For appetite 08/31/13  Yes Tiffany L Reed, DO  pantoprazole (PROTONIX) 40 MG tablet Take 40 mg by mouth every morning.   Yes Historical Provider, MD  potassium chloride SA (K-DUR,KLOR-CON) 20 MEQ tablet Take 40-60 mEq by mouth 2 (two) times daily. Takes 2 tablets every morning and takes 3 tablets every evening.   Yes Historical Provider, MD  predniSONE (DELTASONE) 5 MG tablet Take 1 tablet (5 mg total) by mouth every morning. 08/07/12  Yes Elsie Stain,  MD  rosuvastatin (CRESTOR) 10 MG tablet Take 10 mg by mouth every morning.    Yes Historical Provider, MD  saccharomyces boulardii (FLORASTOR) 250 MG capsule Take 1 capsule (250 mg total) by mouth 2 (two) times daily. 08/31/13  Yes Tiffany L Reed, DO  warfarin (COUMADIN) 4 MG tablet Take 4-6 mg by mouth daily. Takes 4mg  (1tablet) daily, except Wednesday, Friday and Sunday, then takes 6mg  (1 1/2 tablets)   Yes Historical Provider, MD  buprenorphine (BUTRANS) 5 MCG/HR PTWK patch Place 1 patch (5 mcg total) onto the skin once a week. 07/03/13   Tiffany L Reed, DO  ciprofloxacin (CIPRO) 500 MG tablet Take 500 mg by mouth 2 (two) times daily. 7 day course completed on 6/17 for UTI    Historical Provider, MD  zoster vaccine live, PF, (ZOSTAVAX) 93810 UNT/0.65ML injection Inject 19,400 Units into the skin once. 07/27/13   Tiffany L Reed, DO   BP 151/46  Pulse 46  Temp(Src) 98.8 F (37.1 C)  Resp 25  Ht 5\' 6"  (1.676 m)  Wt 141 lb (63.957 kg)  BMI 22.77 kg/m2  SpO2 88% Physical Exam  Nursing note and vitals reviewed. Constitutional: She is oriented to person, place, and time. She appears well-developed. She appears cachectic. No distress.  HENT:  Head: Normocephalic and atraumatic.  Mouth/Throat: Oropharynx is clear and moist.  Eyes: Conjunctivae and EOM are normal. Pupils are equal, round, and reactive to light.  Neck:  Normal range of motion. Neck supple.  Cardiovascular: Normal rate and intact distal pulses.  An irregularly irregular rhythm present.  Murmur heard. Pulmonary/Chest: Effort normal. No respiratory distress. She has no wheezes. She has rales in the right lower field.  Abdominal: Soft. She exhibits no distension. There is no tenderness. There is no rebound and no guarding.  Musculoskeletal: Normal range of motion. She exhibits edema. She exhibits no tenderness.  3+ pitting edema to bilateral lower legs  Neurological: She is alert and oriented to person, place, and time. No cranial nerve deficit or sensory deficit.  Generalized decreased strength in all ext 4/5  Skin: Skin is warm and dry. No rash noted. No erythema. There is pallor.  No splinter hemorrhages  Psychiatric: She has a normal mood and affect. Her behavior is normal.    ED Course  Procedures (including critical care time) Labs Review Labs Reviewed  CBC - Abnormal; Notable for the following:    RDW 16.0 (*)    Platelets 76 (*)    All other components within normal limits  BASIC METABOLIC PANEL - Abnormal; Notable for the following:    Potassium 3.6 (*)    BUN 26 (*)    Creatinine, Ser 1.18 (*)    GFR calc non Af Amer 44 (*)    GFR calc Af Amer 51 (*)    All other components within normal limits  URINALYSIS, ROUTINE W REFLEX MICROSCOPIC - Abnormal; Notable for the following:    Hgb urine dipstick MODERATE (*)    Protein, ur 30 (*)    All other components within normal limits  PRO B NATRIURETIC PEPTIDE - Abnormal; Notable for the following:    Pro B Natriuretic peptide (BNP) 16828.0 (*)    All other components within normal limits  URINE MICROSCOPIC-ADD ON - Abnormal; Notable for the following:    Casts HYALINE CASTS (*)    All other components within normal limits  URINE CULTURE  TROPONIN I  CK  PROTIME-INR  DIGOXIN LEVEL  CBG MONITORING, ED  Imaging Review Dg Chest 2 View  09/12/2013   CLINICAL DATA:   Shortness of breath  EXAM: CHEST  2 VIEW  COMPARISON:  07/21/2012  FINDINGS: Chronic cardiopericardial enlargement with notable pulmonary arterial enlargement, likely from pulmonary hypertension. Bulky mitral annular calcification. Remote median sternotomy, likely for CABG. Coronary stents noted.  There is elevation the anterior right diaphragm, often from previous median sternotomy. No consolidation, edema, effusion, or pneumothorax.  IMPRESSION: No active cardiopulmonary disease.   Electronically Signed   By: Jorje Guild M.D.   On: 09/12/2013 13:13     EKG Interpretation   Date/Time:  Saturday September 12 2013 11:19:08 EDT Ventricular Rate:  61 PR Interval:    QRS Duration: 108 QT Interval:  430 QTC Calculation: 432 R Axis:   68 Text Interpretation:  Atrial fibrillation ST \\T \ T wave abnormality,  consider inferolateral ischemia No significant change since last tracing  Confirmed by Maryan Rued  MD, Loree Fee ([id number]) on 09/12/2013 11:58:17 AM      MDM   Final diagnoses:  C. difficile colitis  Failure to thrive in adult    Patient being brought in by family for failure to thrive, worsening weakness over the last 3 days and no appetite. 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  Patient seen by PCP 2 days ago with blood work drawn however symptoms are worsening and family per her in today because she is too weak to stand at home.  Also worsening lower extremity edema in the last 2 days. Patient had a stool sample sent on 09/08/2013 that was positive for C. difficile current treatment. She also finished a course of Cipro 3 days prior to arrival for a UTI. Patient has peripheral edema, heart murmur and without splinter hemorrhages, fever, rash.  Patient denies any pain and currently is afebrile here with stable blood pressure. EKG shows atrial fibrillation without significant  change from prior EKGs.  Patient's thrive and weakness concerning for recurrent endocarditis versus a result of the C. difficile infection now will need treatment versus renal failure versus CHF.  CBC, BMP, UA, INR, digoxin level, BNP, troponin, CK pending for further evaluation. Chest x-ray pending.  12:30 PM CBC, BMP, CBG are within normal limits.  1:57 PM BMP elevated to 16,000 which is probably the cause for leg swelling and SOB with exertion.  Also PCP noted a new diastolic murmur on last eval.  Will discuss with cardiology for consult for possible echo and eval.  Pt given IVF lasix here.  UA without signs of new infection and CBC, CMP unchanged.  C.diff given positive 4 days ago will start flagyl and admit for further care and evaluation for possibility of recurrent endocarditis as well.  3:00 PM Spoke with Dr. Doylene Canard who will come see the pt.  Blanchie Dessert, MD 09/12/13 [place], MD 09/12/13 1501

## 2013-09-12 NOTE — ED Notes (Signed)
U/A collected and sent.

## 2013-09-13 ENCOUNTER — Encounter (HOSPITAL_COMMUNITY): Payer: Self-pay | Admitting: *Deleted

## 2013-09-13 DIAGNOSIS — I38 Endocarditis, valve unspecified: Secondary | ICD-10-CM

## 2013-09-13 DIAGNOSIS — I5023 Acute on chronic systolic (congestive) heart failure: Secondary | ICD-10-CM

## 2013-09-13 DIAGNOSIS — A0472 Enterocolitis due to Clostridium difficile, not specified as recurrent: Principal | ICD-10-CM

## 2013-09-13 DIAGNOSIS — I4891 Unspecified atrial fibrillation: Secondary | ICD-10-CM

## 2013-09-13 DIAGNOSIS — E43 Unspecified severe protein-calorie malnutrition: Secondary | ICD-10-CM

## 2013-09-13 LAB — COMPREHENSIVE METABOLIC PANEL
ALT: 11 U/L (ref 0–35)
AST: 23 U/L (ref 0–37)
Albumin: 2.8 g/dL — ABNORMAL LOW (ref 3.5–5.2)
Alkaline Phosphatase: 103 U/L (ref 39–117)
BILIRUBIN TOTAL: 2.2 mg/dL — AB (ref 0.3–1.2)
BUN: 23 mg/dL (ref 6–23)
CO2: 25 mEq/L (ref 19–32)
Calcium: 8.8 mg/dL (ref 8.4–10.5)
Chloride: 100 mEq/L (ref 96–112)
Creatinine, Ser: 1.03 mg/dL (ref 0.50–1.10)
GFR calc non Af Amer: 52 mL/min — ABNORMAL LOW (ref >90)
GFR, EST AFRICAN AMERICAN: 60 mL/min — AB (ref >90)
Glucose, Bld: 80 mg/dL (ref 70–99)
POTASSIUM: 3.5 meq/L — AB (ref 3.7–5.3)
SODIUM: 141 meq/L (ref 137–147)
TOTAL PROTEIN: 6.5 g/dL (ref 6.0–8.3)

## 2013-09-13 LAB — URINE CULTURE: Colony Count: 6000

## 2013-09-13 LAB — OSMOLALITY, URINE: Osmolality, Ur: 335 mosm/kg — ABNORMAL LOW (ref 390–1090)

## 2013-09-13 LAB — CBC
HEMATOCRIT: 40.2 % (ref 36.0–46.0)
Hemoglobin: 13.4 g/dL (ref 12.0–15.0)
MCH: 31.5 pg (ref 26.0–34.0)
MCHC: 33.3 g/dL (ref 30.0–36.0)
MCV: 94.6 fL (ref 78.0–100.0)
Platelets: 63 10*3/uL — ABNORMAL LOW (ref 150–400)
RBC: 4.25 MIL/uL (ref 3.87–5.11)
RDW: 16.1 % — ABNORMAL HIGH (ref 11.5–15.5)
WBC: 3.7 10*3/uL — ABNORMAL LOW (ref 4.0–10.5)

## 2013-09-13 LAB — STOOL CULTURE: E coli, Shiga toxin Assay: NEGATIVE

## 2013-09-13 LAB — PROTIME-INR
INR: 7.33 — AB (ref 0.00–1.49)
PROTHROMBIN TIME: 59.4 s — AB (ref 11.6–15.2)

## 2013-09-13 MED ORDER — METOPROLOL TARTRATE 12.5 MG HALF TABLET
12.5000 mg | ORAL_TABLET | Freq: Two times a day (BID) | ORAL | Status: DC
  Administered 2013-09-14: 12.5 mg via ORAL
  Filled 2013-09-13 (×5): qty 1

## 2013-09-13 MED ORDER — PHYTONADIONE 5 MG PO TABS
5.0000 mg | ORAL_TABLET | Freq: Once | ORAL | Status: AC
  Administered 2013-09-13: 5 mg via ORAL
  Filled 2013-09-13: qty 1

## 2013-09-13 MED ORDER — ENSURE COMPLETE PO LIQD: 237.0000 mL | Freq: Two times a day (BID) | ORAL | Status: DC

## 2013-09-13 MED ORDER — DIGOXIN 0.0625 MG HALF TABLET
0.0625 mg | ORAL_TABLET | Freq: Every morning | ORAL | Status: DC
  Administered 2013-09-14: 0.0625 mg via ORAL
  Filled 2013-09-13 (×2): qty 1

## 2013-09-13 MED ORDER — POTASSIUM CHLORIDE CRYS ER 20 MEQ PO TBCR
30.0000 meq | EXTENDED_RELEASE_TABLET | Freq: Every day | ORAL | Status: DC
  Administered 2013-09-13: 30 meq via ORAL
  Filled 2013-09-13 (×2): qty 1

## 2013-09-13 MED ORDER — WARFARIN - PHARMACIST DOSING INPATIENT
Freq: Every day | Status: DC
  Administered 2013-09-14: 18:00:00

## 2013-09-13 MED ORDER — METRONIDAZOLE 500 MG PO TABS
500.0000 mg | ORAL_TABLET | Freq: Three times a day (TID) | ORAL | Status: DC
  Administered 2013-09-13 – 2013-09-15 (×7): 500 mg via ORAL
  Filled 2013-09-13 (×9): qty 1

## 2013-09-13 MED ORDER — BIOTENE DRY MOUTH MT LIQD
15.0000 mL | Freq: Two times a day (BID) | OROMUCOSAL | Status: DC
  Administered 2013-09-14: 15 mL via OROMUCOSAL

## 2013-09-13 MED ORDER — FUROSEMIDE 20 MG PO TABS
20.0000 mg | ORAL_TABLET | Freq: Every day | ORAL | Status: DC
  Administered 2013-09-13 – 2013-09-15 (×3): 20 mg via ORAL
  Filled 2013-09-13 (×3): qty 1

## 2013-09-13 MED ORDER — POTASSIUM CHLORIDE CRYS ER 20 MEQ PO TBCR
40.0000 meq | EXTENDED_RELEASE_TABLET | Freq: Once | ORAL | Status: AC
  Administered 2013-09-13: 40 meq via ORAL
  Filled 2013-09-13: qty 2

## 2013-09-13 NOTE — Progress Notes (Signed)
INR 7.33 now, platelets 63, and WBC 3.5, MD notified and aware will continue to monitor, thanks Arvella Nigh RN

## 2013-09-13 NOTE — Progress Notes (Signed)
ANTICOAGULATION CONSULT NOTE - Follow Up Consult  Pharmacy Consult for warfarin Indication: atrial fibrillation  Allergies  Allergen Reactions  . Ceftriaxone     RASH BUT TOLERATED AMPICILLIN AND IMIPENEM WITHOUT PROBLEMS  . Codeine Nausea Only    Patient Measurements: Height: 5\' 6"  (167.6 cm) Weight: 138 lb 14.2 oz (63 kg) IBW/kg (Calculated) : 59.3  Vital Signs: Temp: 97.2 F (36.2 C) (06/21 0515) Temp src: Oral (06/21 0515) BP: 114/67 mmHg (06/21 0515) Pulse Rate: 68 (06/21 0001)  Labs:  Recent Labs  09/10/13 1455 09/12/13 1122 09/12/13 1828 09/13/13 0320  HGB 14.0 14.4  --  13.4  HCT 42.9 43.6  --  40.2  PLT  --  76*  --  63*  LABPROT  --   --  65.6* 59.4*  INR  --   --  8.33* 7.33*  CREATININE 1.46* 1.18*  --  1.03  CKTOTAL  --  87  --   --   TROPONINI  --  <0.30  --   --     Estimated Creatinine Clearance: 44.2 ml/min (by C-G formula based on Cr of 1.03).   Medications:  Scheduled:  . [START ON 09/19/2013] buprenorphine  5 mcg Transdermal Weekly  . digoxin  0.125 mg Oral q morning - 10a  . gabapentin  300 mg Oral QHS  . metoprolol  25 mg Oral BID  . mirtazapine  7.5 mg Oral QHS  . predniSONE  5 mg Oral q morning - 10a  . saccharomyces boulardii  250 mg Oral BID    Assessment: 73 yo F admitted with complaints of not walking for 4-5 days, diarrhea for 2 months, and occasional vomiting. She is on warfarin PTA for atrial fibrillation, and pharmacy has been consulted to manage her anticoagulation therapy.  Her INR was SUPRAtherapeutic upon admission at 8.33, though no bleeding noted. Yesterday's warfarin dose was held, and no vitamin K was administered. Her INR remains SUPRAtherapeutic this morning, though has trended down to 7.33.  Hgb is stable, and platelets are low at 63, though patient seems to have chronic thrombocytopenia component, as her platelet count was only 123 two months ago. No bleeding issues noted.   It appears she recently completed a  course of Cipro for a UTI, which could have potentially elevated the INR. Of note, patient has been found to be C diff + for which one dose of Flagyl has been given. Flagyl also has the potential to elevate the INR, and this interaction will continue to be monitored.  Goal of Therapy:  INR 2-3 Monitor platelets by anticoagulation protocol: Yes   Plan:  -Hold coumadin tonight -Daily INR -f/u bleeding complications   Katelyn C. Felt, PharmD Clinical Pharmacist-Resident Pager: [phone] Pharmacy: [phone] 09/13/2013 7:31 AM

## 2013-09-13 NOTE — Progress Notes (Signed)
[facility] PROGRESS NOTE  [name] ION:[id number] DOB: October 14, 1940 DOA: 09/12/2013 PCP: Hollace Kinnier, DO  Assessment/Plan: Weakness/deconditioning: due to poor PO intake and decline of chronic problems. -will provide supportive care -optimize her conditions and ask PT to evaluate  C. Diff colitis: -continue treatment with flagyl and florastor -diarrhea significantly improved  Endocarditis -Follow 2-d echo results -continue chronic amoxicillin for now  Acute on chronic systolic heart failure -mild exacerbation -improved with IV lasix given during admission -will switch to PO lasix now -low sodium diet, daily weight and strict intake and output  Acute on chronic renal failure Renal function back to normal after lasix -will monitor  HTN: stable. Continue current medication regimen  Protein calorie malnutrition: severe -will start ensure BID -continue remeron and megace -follow rec's from nutrition service  Atrial fibrillation Rate controlled with intermittent episodes of bradycardia -will adjust amiodarone and metoprolol -continue coumadin pe rpharmacy  Elevated INR -will give low dose Vit K and follow levels -no overt bleeding seen  Thrombocytopenia -chronic -will follow trend    Code Status: partial code  Family Communication: daughter at bedside  Disposition Plan: to be determine    Consultants:  Cardiology (Dr. Doylene Canard)  Procedures:  2-D echo: pending  Antibiotics:  Flagyl   HPI/Subjective: Feeling slightly better; no fever. Reports increase weakness tiredness and with decrease appetite   Objective: Filed Vitals:   09/13/13 1343  BP: 135/67  Pulse: 50  Temp: 97.7 F (36.5 C)  Resp: 16    Intake/Output Summary (Last 24 hours) at 09/13/13 1440 Last data filed at 09/13/13 0940  Gross per 24 hour  Intake    240 ml  Output    775 ml  Net   -535 ml   Filed Weights   09/12/13 1122 09/12/13 1601 09/13/13 0515  Weight:  63.957 kg (141 lb) 62.6 kg (138 lb 0.1 oz) 63 kg (138 lb 14.2 oz)    Exam:   General:  Feeling slightly better; patient is very weak and deconditioned. No fever  Cardiovascular: bradycardic, no rubs or gallops. 3++ edema bilaterally  Respiratory: fine crackles at the bases, no wheezing  Abdomen: soft, NT, ND, positive BS  Musculoskeletal: no joint swelling; no cyanosis   Data Reviewed: Basic Metabolic Panel:  Recent Labs Lab 09/10/13 1455 09/12/13 1122 09/13/13 0320  NA 142 140 141  K 3.6 3.6* 3.5*  CL 101 99 100  CO2 25 26 25   GLUCOSE 91 81 80  BUN 29* 26* 23  CREATININE 1.46* 1.18* 1.03  CALCIUM 9.0 9.3 8.8   Liver Function Tests:  Recent Labs Lab 09/10/13 1455 09/13/13 0320  AST 20 23  ALT 10 11  ALKPHOS 101 103  BILITOT 1.7* 2.2*  PROT 6.1 6.5  ALBUMIN  --  2.8*   CBC:  Recent Labs Lab 09/10/13 1455 09/12/13 1122 09/13/13 0320  WBC 4.8 5.4 3.7*  NEUTROABS 3.0  --   --   HGB 14.0 14.4 13.4  HCT 42.9 43.6 40.2  MCV 95 94.6 94.6  PLT  --  76* 63*   Cardiac Enzymes:  Recent Labs Lab 09/12/13 1122  CKTOTAL 87  TROPONINI <0.30   BNP (last 3 results)  Recent Labs  09/12/13 1122  PROBNP 16828.0*   CBG:  Recent Labs Lab 09/12/13 1139  GLUCAP 81    Recent Results (from the past 240 hour(s))  STOOL CULTURE     Status: None   Collection Time    09/08/13  1:56 PM  Result Value Ref Range Status   Salmonella/Shigella Screen Final report   Final   RESULT 1 Comment   Final   Comment: No Salmonella or Shigella recovered.   Campylobacter Culture Final report   Final   RESULT 1 Comment   Final   Comment: No Campylobacter species isolated.   E coli, Shiga toxin Assay Negative  Negative Final  CLOSTRIDIUM DIFFICILE BY PCR     Status: Abnormal   Collection Time    09/08/13  2:02 PM      Result Value Ref Range Status   C difficile by pcr Positive (*) Negative Final   Comment: Toxigenic C difficile: Positive     Epidemic Strain  Bl/NAP1/027: Presumptive Negative  CULTURE, BLOOD (SINGLE)     Status: None   Collection Time    09/10/13  2:54 PM      Result Value Ref Range Status   BLOOD CULTURE, ROUTINE Preliminary report   Preliminary   RESULT 1 Comment   Preliminary   Comment: No growth in 36 - 48 hours.  CULTURE, BLOOD (SINGLE)     Status: None   Collection Time    09/10/13  2:54 PM      Result Value Ref Range Status   BLOOD CULTURE, ROUTINE Preliminary report   Preliminary   RESULT 1 Comment   Preliminary   Comment: No growth in 36 - 48 hours.     Studies: Dg Chest 2 View  09/12/2013   CLINICAL DATA:  Shortness of breath  EXAM: CHEST  2 VIEW  COMPARISON:  07/21/2012  FINDINGS: Chronic cardiopericardial enlargement with notable pulmonary arterial enlargement, likely from pulmonary hypertension. Bulky mitral annular calcification. Remote median sternotomy, likely for CABG. Coronary stents noted.  There is elevation the anterior right diaphragm, often from previous median sternotomy. No consolidation, edema, effusion, or pneumothorax.  IMPRESSION: No active cardiopulmonary disease.   Electronically Signed   By: Jorje Guild M.D.   On: 09/12/2013 13:13    Scheduled Meds: . [START ON 09/19/2013] buprenorphine  5 mcg Transdermal Weekly  . [START ON 09/14/2013] digoxin  0.0625 mg Oral q morning - 10a  . feeding supplement (ENSURE COMPLETE)  237 mL Oral BID BM  . furosemide  20 mg Oral Daily  . gabapentin  300 mg Oral QHS  . metoprolol  12.5 mg Oral BID  . mirtazapine  7.5 mg Oral QHS  . potassium chloride  30 mEq Oral Daily  . predniSONE  5 mg Oral q morning - 10a  . saccharomyces boulardii  250 mg Oral BID  . Warfarin - Pharmacist Dosing Inpatient   Does not apply q1800   Continuous Infusions:   Principal Problem:   C. difficile colitis Active Problems:   RESTLESS LEG SYNDROME, MILD   Atrial fibrillation   Endocarditis   Lupus   CKD (chronic kidney disease) stage 3, GFR 30-59 ml/min    Time spent:  >30 minutes    Barton Dubois  [facility] Pager [phone]. If 7PM-7AM, please contact night-coverage at [email or web address], password [facility] 09/13/2013, 2:40 PM  LOS: 1 day

## 2013-09-13 NOTE — Plan of Care (Signed)
Problem: Consults Goal: Nutrition Consult-if indicated Outcome: Completed/Met Date Met:  09/13/13 Nutrition consult ordered.

## 2013-09-13 NOTE — Progress Notes (Signed)
Pt had INR of 8.33, no s/s of bleeding, MD notified, MD ordered another PT/INR in am, will continue to monitor, Thanks Arvella Nigh RN

## 2013-09-13 NOTE — Progress Notes (Signed)
  Echocardiogram 2D Echocardiogram has been performed.  [name] 09/13/2013, 10:52 AM

## 2013-09-13 NOTE — Progress Notes (Signed)
Patient drowsy but arousable during bedside report.  Disoriented to time and situation until more awake.  Patient alert and oriented x4 when assisted to sit up in bed by RNs.  Assisted patient to bedside commode, oxygen saturation fell to 72% with ambulation 19ft.  Oxygen at 3L via nasal cannula reapplied.  Able to wean to 2L via nasal cannula, saturation 94%.  Will continue to monitor.

## 2013-09-13 NOTE — Progress Notes (Signed)
Utilization review completed.  

## 2013-09-14 ENCOUNTER — Telehealth: Payer: Self-pay | Admitting: *Deleted

## 2013-09-14 DIAGNOSIS — N183 Chronic kidney disease, stage 3 unspecified: Secondary | ICD-10-CM

## 2013-09-14 DIAGNOSIS — K219 Gastro-esophageal reflux disease without esophagitis: Secondary | ICD-10-CM

## 2013-09-14 LAB — BASIC METABOLIC PANEL
BUN: 20 mg/dL (ref 6–23)
CHLORIDE: 101 meq/L (ref 96–112)
CO2: 27 mEq/L (ref 19–32)
Calcium: 8.5 mg/dL (ref 8.4–10.5)
Creatinine, Ser: 1.04 mg/dL (ref 0.50–1.10)
GFR calc Af Amer: 59 mL/min — ABNORMAL LOW (ref >90)
GFR calc non Af Amer: 51 mL/min — ABNORMAL LOW (ref >90)
Glucose, Bld: 92 mg/dL (ref 70–99)
POTASSIUM: 3.1 meq/L — AB (ref 3.7–5.3)
Sodium: 140 mEq/L (ref 137–147)

## 2013-09-14 LAB — CBC
HEMATOCRIT: 36.8 % (ref 36.0–46.0)
Hemoglobin: 11.9 g/dL — ABNORMAL LOW (ref 12.0–15.0)
MCH: 30.9 pg (ref 26.0–34.0)
MCHC: 32.3 g/dL (ref 30.0–36.0)
MCV: 95.6 fL (ref 78.0–100.0)
PLATELETS: 59 10*3/uL — AB (ref 150–400)
RBC: 3.85 MIL/uL — ABNORMAL LOW (ref 3.87–5.11)
RDW: 16.2 % — AB (ref 11.5–15.5)
WBC: 3.6 10*3/uL — AB (ref 4.0–10.5)

## 2013-09-14 LAB — PROTIME-INR
INR: 2.44 — AB (ref 0.00–1.49)
Prothrombin Time: 25.7 seconds — ABNORMAL HIGH (ref 11.6–15.2)

## 2013-09-14 MED ORDER — WARFARIN SODIUM 4 MG PO TABS
4.0000 mg | ORAL_TABLET | Freq: Once | ORAL | Status: AC
  Administered 2013-09-14: 4 mg via ORAL
  Filled 2013-09-14: qty 1

## 2013-09-14 MED ORDER — ADULT MULTIVITAMIN W/MINERALS CH
1.0000 | ORAL_TABLET | Freq: Every day | ORAL | Status: DC
  Administered 2013-09-14 – 2013-09-15 (×2): 1 via ORAL
  Filled 2013-09-14 (×2): qty 1

## 2013-09-14 MED ORDER — MEGESTROL ACETATE 400 MG/10ML PO SUSP
400.0000 mg | Freq: Every day | ORAL | Status: DC
  Administered 2013-09-14 – 2013-09-15 (×2): 400 mg via ORAL
  Filled 2013-09-14 (×2): qty 10

## 2013-09-14 MED ORDER — POTASSIUM CHLORIDE CRYS ER 20 MEQ PO TBCR
40.0000 meq | EXTENDED_RELEASE_TABLET | Freq: Every day | ORAL | Status: DC
  Administered 2013-09-14 – 2013-09-15 (×2): 40 meq via ORAL
  Filled 2013-09-14 (×2): qty 2

## 2013-09-14 MED ORDER — AMOXICILLIN 500 MG PO CAPS
500.0000 mg | ORAL_CAPSULE | Freq: Two times a day (BID) | ORAL | Status: DC
  Administered 2013-09-14 – 2013-09-15 (×3): 500 mg via ORAL
  Filled 2013-09-14 (×4): qty 1

## 2013-09-14 NOTE — Evaluation (Signed)
Physical Therapy Evaluation Patient Details Name: [name] MRN: [id number] DOB: 08-Jun-1940 Today's Date: 09/14/2013   History of Present Illness  Adm 6/20 with h/o diarrhea x 2 months with progressive weakness. Pt was unable to walk on admission. PMHx- lupus, AVR with endocarditis, home O2 (daughter reports she only uses at night), pulmonary fibrosis, arthritis, CHF  Clinical Impression  Pt admitted with diarrhea and inability to walk due to profound weakness. Pt currently with functional limitations due to the deficits listed below (see PT Problem List).  Pt will benefit from skilled PT to increase their independence and safety with mobility to allow discharge to the venue listed below. Pt reluctant to agree to HHPT, however daughter felt it would be helpful and convinced pt to agree.      Follow Up Recommendations Home health PT;Supervision/Assistance - 24 hour    Equipment Recommendations  None recommended by PT    Recommendations for Other Services OT consult     Precautions / Restrictions Precautions Precautions: Fall      Mobility  Bed Mobility Overal bed mobility: Needs Assistance Bed Mobility: Supine to Sit     Supine to sit: Min assist;HOB elevated     General bed mobility comments: pt preferred to pull to sit from supine (using rail)) and then pivot to sit EOB. incr time and effort   Transfers Overall transfer level: Needs assistance Equipment used: Rolling walker (2 wheeled) Transfers: Sit to/from [facility] Sit to Stand: Min assist Stand pivot transfers: Min assist       General transfer comment: x 2; vc for safe use of RW, vc and facilitation for weight shift forward over BOS, steadying assist on standing  Ambulation/Gait Ambulation/Gait assistance: Min assist Ambulation Distance (Feet): 15 Feet Assistive device: Rolling walker (2 wheeled) Gait Pattern/deviations: Step-through pattern;Decreased stride length;Trunk flexed   Gait  velocity interpretation: <1.8 ft/sec, indicative of risk for recurrent falls General Gait Details: very small steps; vc for safe use of RW and upright posture to maintain proper distance to [employer] Mobility    Modified Rankin (Stroke Patients Only)       Balance Overall balance assessment: Needs assistance Sitting-balance support: Single extremity supported;Feet supported Sitting balance-Leahy Scale: Poor     Standing balance support: Bilateral upper extremity supported Standing balance-Leahy Scale: Poor                               Pertinent Vitals/Pain Denied pain On arrival on RA with SaO2 84%; with deep breaths ony able to incr to 85% 2LO2 via [place] replaced with SaO2 up to 97% (sitting EOB)    Home Living Family/patient expects to be discharged to:: Private residence Living Arrangements: Children Available Help at Discharge: Family;Available 24 hours/day Type of Home: Apartment Home Access: Ramped entrance     Home Layout: One level Home Equipment: Walker - 2 wheels;Bedside commode;Wheelchair - manual;Shower seat - built in;Grab bars - toilet;Grab bars - tub/shower      Prior Function Level of Independence: Needs assistance   Gait / Transfers Assistance Needed: prior to recent decline, daughter reports she was imbalanced and supposed to use RW, but wouldn't           Hand Dominance   Dominant Hand: Right    Extremity/Trunk Assessment   Upper Extremity Assessment: Generalized weakness  Lower Extremity Assessment: Generalized weakness      Cervical / Trunk Assessment: Normal  Communication   Communication: No difficulties  Cognition Arousal/Alertness: Awake/alert Behavior During Therapy: Flat affect Overall Cognitive Status: History of cognitive impairments - at baseline       Memory: Decreased short-term memory (deferred questions to her daughter)              General Comments  General comments (skin integrity, edema, etc.): Daughter present throughout    Exercises        Assessment/Plan    PT Assessment Patient needs continued PT services  PT Diagnosis Generalized weakness;Difficulty walking   PT Problem List Decreased strength;Decreased activity tolerance;Decreased balance;Decreased mobility;Decreased knowledge of use of DME;Cardiopulmonary status limiting activity  PT Treatment Interventions DME instruction;Gait training;Functional mobility training;Therapeutic activities;Therapeutic exercise;Balance training;Cognitive remediation;Patient/family education   PT Goals (Current goals can be found in the Care Plan section) Acute Rehab PT Goals Patient Stated Goal: to walk without rW PT Goal Formulation: With patient Time For Goal Achievement: 09/21/13 Potential to Achieve Goals: Good    Frequency Min 3X/week   Barriers to discharge        Co-evaluation               End of Session Equipment Utilized During Treatment: Gait belt;Oxygen Activity Tolerance: Patient limited by fatigue Patient left: in chair;with call bell/phone within reach;with chair alarm set;with family/visitor present           Time: 1115-1150 PT Time Calculation (min): 35 min   Charges:         PT G Codes:          SASSER,LYNN 2013/10/05, 12:39 PM Pager [phone]

## 2013-09-14 NOTE — Progress Notes (Signed)
Patient is currently active with [facility] for chronic disease management services.  Patient has been engaged by a [employer].  Our community based plan of care has focused on disease management.  Patient has continued to lose weight and would like to continue with the use of probiotics and yogurt at home to restore normal bowel flora.  She had started an appetite stimulant prior to admission that dive provide her with some positive effect.Patient will receive a post discharge transition of care call and will be evaluated for monthly home visits for assessments and disease process education.  Made Inpatient Case Manager aware that [facility] following. Of note, [facility] Care Management services does not replace or interfere with any services that are arranged by inpatient case management or social work.  For additional questions or referrals please contact Corliss Blacker BSN RN [facility] Liaison at [phone].

## 2013-09-14 NOTE — Progress Notes (Signed)
Subjective:  Patient denies any chest pain and shortness of breath. Denies any further diarrhea. Denies abdominal pain. States appetite is slowly improving. Denies fever or chills.  Objective:  Vital Signs in the last 24 hours: Temp:  [97.5 F (36.4 C)-98.3 F (36.8 C)] 97.7 F (36.5 C) (06/22 0500) Pulse Rate:  [48-73] 61 (06/22 1037) Resp:  [16-20] 18 (06/22 0500) BP: (130-150)/(61-77) 141/61 mmHg (06/22 1037) SpO2:  [94 %-97 %] 95 % (06/22 0500) Weight:  [62 kg (136 lb 11 oz)] 62 kg (136 lb 11 oz) (06/22 0500)  Intake/Output from previous day: 06/21 0701 - 06/22 0700 In: 800 [P.O.:800] Out: 2025 [Urine:2025] Intake/Output from this shift: Total I/O In: 240 [P.O.:240] Out: -   Physical Exam: Neck: no adenopathy, no carotid bruit, no JVD and supple, symmetrical, trachea midline Lungs: Decreased breath sound at bases Heart: irregularly irregular rhythm, S1, S2 normal and Soft systolic and diastolic murmur noted Abdomen: soft, non-tender; bowel sounds normal; no masses,  no organomegaly Extremities: extremities normal, atraumatic, no cyanosis or edema  Lab Results:  Recent Labs  09/13/13 0320 09/14/13 0255  WBC 3.7* 3.6*  HGB 13.4 11.9*  PLT 63* 59*    Recent Labs  09/13/13 0320 09/14/13 0255  NA 141 140  K 3.5* 3.1*  CL 100 101  CO2 25 27  GLUCOSE 80 92  BUN 23 20  CREATININE 1.03 1.04    Recent Labs  09/12/13 1122  TROPONINI <0.30   Hepatic Function Panel  Recent Labs  09/13/13 0320  PROT 6.5  ALBUMIN 2.8*  AST 23  ALT 11  ALKPHOS 103  BILITOT 2.2*   No results found for this basename: CHOL,  in the last 72 hours No results found for this basename: PROTIME,  in the last 72 hours  Imaging: Imaging results have been reviewed and Dg Chest 2 View  09/12/2013   CLINICAL DATA:  Shortness of breath  EXAM: CHEST  2 VIEW  COMPARISON:  07/21/2012  FINDINGS: Chronic cardiopericardial enlargement with notable pulmonary arterial enlargement, likely  from pulmonary hypertension. Bulky mitral annular calcification. Remote median sternotomy, likely for CABG. Coronary stents noted.  There is elevation the anterior right diaphragm, often from previous median sternotomy. No consolidation, edema, effusion, or pneumothorax.  IMPRESSION: No active cardiopulmonary disease.   Electronically Signed   By: Jorje Guild M.D.   On: 09/12/2013 13:13    Cardiac Studies:  Assessment/Plan:  Resolving C. 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

## 2013-09-14 NOTE — Telephone Encounter (Signed)
Message copied by Eilene Ghazi on Mon Sep 14, 2013  1:02 PM ------      Message from: Alianza, [place] L      Created: Mon Sep 14, 2013 12:30 PM       I see that she has gone to the hospital and looks like she is still there.  Please still call her daughter:      C diff is positive (antibiotic associated diarrhea infection--overgrowth of the wrong bacteria due to good bacteria being killed off).  She needs to be started on flagyl 500mg  po tid for 14 days for this.  This is probably why she has felt so bad and lost her appetite.  I will also copy Dr. Drucilla Schmidt on this to see what he would like to do about her ongoing amoxicillin for the endocarditis she has (to be indefinite therapy). ------

## 2013-09-14 NOTE — Telephone Encounter (Signed)
Spoke with patient's daughter(Robin) to inform her that we did get the results of the stool sample, and aware that patient is in the hospital already taking antibiotics.

## 2013-09-14 NOTE — Plan of Care (Signed)
Problem: Phase I Progression Outcomes Goal: EF % per last Echo/documented,Core Reminder form on chart Outcome: Completed/Met Date Met:  09/14/13 55-60%

## 2013-09-14 NOTE — Progress Notes (Signed)
Report given to receiving RN. Patient in bed resting with family at bed side. No signs or symptoms of distress or discomfort noted. 

## 2013-09-14 NOTE — Progress Notes (Signed)
ANTICOAGULATION CONSULT NOTE - Follow Up Consult  Pharmacy Consult for Warfarin Indication: atrial fibrillation  Allergies  Allergen Reactions  . Ceftriaxone     RASH BUT TOLERATED AMPICILLIN AND IMIPENEM WITHOUT PROBLEMS  . Codeine Nausea Only    Patient Measurements: Height: 5\' 6"  (167.6 cm) Weight: 136 lb 11 oz (62 kg) IBW/kg (Calculated) : 59.3  Vital Signs: Temp: 97.7 F (36.5 C) (06/22 0500) Temp src: Oral (06/22 0500) BP: 141/61 mmHg (06/22 1037) Pulse Rate: 61 (06/22 1037)  Labs:  Recent Labs  09/12/13 1122 09/12/13 1828 09/13/13 0320 09/14/13 0255  HGB 14.4  --  13.4 11.9*  HCT 43.6  --  40.2 36.8  PLT 76*  --  63* 59*  LABPROT  --  65.6* 59.4* 25.7*  INR  --  8.33* 7.33* 2.44*  CREATININE 1.18*  --  1.03 1.04  CKTOTAL 87  --   --   --   TROPONINI <0.30  --   --   --     Estimated Creatinine Clearance: 43.8 ml/min (by C-G formula based on Cr of 1.04).   Medications:  Scheduled:  . antiseptic oral rinse  15 mL Mouth Rinse BID  . [START ON 09/19/2013] buprenorphine  5 mcg Transdermal Weekly  . digoxin  0.0625 mg Oral q morning - 10a  . feeding supplement (ENSURE COMPLETE)  237 mL Oral BID BM  . furosemide  20 mg Oral Daily  . gabapentin  300 mg Oral QHS  . metoprolol  12.5 mg Oral BID  . metroNIDAZOLE  500 mg Oral 3 times per day  . mirtazapine  7.5 mg Oral QHS  . potassium chloride  40 mEq Oral Daily  . predniSONE  5 mg Oral q morning - 10a  . saccharomyces boulardii  250 mg Oral BID  . Warfarin - Pharmacist Dosing Inpatient   Does not apply q1800    Assessment: [name] on warfarin PTA for hx Afib - admitted with a SUPRAtherapeutic INR of 8.33 on admission. PTA dose was 4 mg daily EXCEPT for 6 mg on Wed/Fri/Sat. The patient is noted to be on chronic Amoxicillin suppressive therapy (500 mg bid) for recurrent Enterococcus IE and also to have recently completed a 7 day course of Cipro for UTI. Both of these are known to increase warfarin sensitivity.  The patient is also noted to have a poor appetite which will also elevate INR.    The INR today is therapeutic after a dose of Vit K 5 mg po given on 6/21 (INR 2.44 << 7.33, goal of 2-3). The patient is also noted to be on flagyl for CDiff treatment this admission - which will increase warfarin sensitivity (class D interaction). This was discussed with Dr. Dyann Kief who wishes to continue Flagyl for now instead of switching to po Vancomycin for CDiff treatment. Will plan to monitor INR closely. Hgb/Hct slight drop - no bleeding noted. Plts low on admit (76) and continuing to trend down (59 today). No overt s/sx of bleeding noted at this time.   Goal of Therapy:  INR 2-3 Monitor platelets by anticoagulation protocol: Yes   Plan:  1. Warfarin 4 mg x 1 dose at 1800 today 2 Will continue to monitor for any signs/symptoms of bleeding and will follow up with PT/INR in the a.m.   Alycia Rossetti, PharmD, BCPS Clinical Pharmacist Pager: [phone] 09/14/2013 11:36 AM

## 2013-09-14 NOTE — Progress Notes (Signed)
INITIAL NUTRITION ASSESSMENT  DOCUMENTATION CODES Per approved criteria  -Severe malnutrition in the context of chronic illness  Pt meets criteria for SEVERE MALNUTRITION in the context of CHRONIC ILLNESS as evidenced by 10% weight loss in less than 3 months, estimated energy intake <75% of estimated energy needs for > 1 month, and moderate/severe wasting per physical exam.  INTERVENTION: Provide Snacks BID Provide Carnation Instant Breakfast once daily Provide Magic Cup ice cream once daily Provide Multivitamin with minerals daily Discontinue Ensure  NUTRITION DIAGNOSIS: Inadequate oral intake related to decreased appetite as evidenced by 10% weight loss in less than 3 months.   Goal: Pt to meet >/= 90% of their estimated nutrition needs   Monitor:  PO intake, supplement acceptance, weight trend, labs  Reason for Assessment: Consult/ Malnutrition Screening Tool  73 y.o. female  Admitting Dx: C. difficile colitis  ASSESSMENT: 73 y.o. 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 She is brought in by her daughter due to not walking 4-5 days. She has had diarrhea for 2 months and some occasional vomiting (once or twice a week). Diarrhea is very bad now and she is incontinent of stool. Her PCP perfomed a C diff PCR on 6 /16 which is found to be positive by the ER doc.   Weight history shows pt has lost 10% of her body weight in less than 3 months. Pt reports that for the past couple months her appetite has been poor and she has been eating significantly less than usual, about 50% less. She reports that she used to weight 160 lbs. Pt states that she has not had any more diarrhea since admission; her appetite is better but, she does not like the hospital food. Pt has been ordered Ensure Complete BID but, RN report pt does not like Ensure and has been refusing all supplements.  Pt reports inability  to walk recently due to feeling very weak. RD encouraged PO intake. Pt agreeable to receiving snacks, carnation instant breakfast, and magic cup ice cream.   Nutrition Focused Physical Exam:  Subcutaneous Fat:  Orbital Region: wnl Upper Arm Region: mild wasting Thoracic and Lumbar Region: moderate/severe wasting  Muscle:  Temple Region: moderate wasting Clavicle Bone Region: moderate wasting Clavicle and Acromion Bone Region: moderate wasting Scapular Bone Region: NA Dorsal Hand: moderate wasting Patellar Region: mild wasting Anterior Thigh Region: moderate wasting Posterior Calf Region: NA  Edema: +1 RLE and LLE edema   Height: Ht Readings from Last 1 Encounters:  09/12/13 5\' 6"  (1.676 m)    Weight: Wt Readings from Last 1 Encounters:  09/14/13 136 lb 11 oz (62 kg)    Ideal Body Weight: 130 lbs  % Ideal Body Weight: 105%  Wt Readings from Last 10 Encounters:  09/14/13 136 lb 11 oz (62 kg)  08/31/13 140 lb (63.504 kg)  08/03/13 147 lb (66.679 kg)  07/27/13 144 lb (65.318 kg)  07/09/13 151 lb 12.8 oz (68.856 kg)  07/03/13 161 lb (73.029 kg)  06/01/13 144 lb (65.318 kg)  05/25/13 148 lb (67.132 kg)  04/27/13 151 lb 12.8 oz (68.856 kg)  02/23/13 154 lb 12.8 oz (70.217 kg)    Usual Body Weight: 160 lbs  % Usual Body Weight: 85%  BMI:  Body mass index is 22.07 kg/(m^2).  Estimated Nutritional Needs: Kcal: 1550-1750 Protein: 70-80 grams Fluid: 1.5-1.7 L/day  Skin: +1 RLE and LLE edema; intact  Diet  Order: Cardiac  EDUCATION NEEDS: -No education needs identified at this time   Intake/Output Summary (Last 24 hours) at 09/14/13 1507 Last data filed at 09/14/13 0853  Gross per 24 hour  Intake    800 ml  Output   1675 ml  Net   -875 ml    Last BM: 6/18  Labs:   Recent Labs Lab 09/12/13 1122 09/13/13 0320 09/14/13 0255  NA 140 141 140  K 3.6* 3.5* 3.1*  CL 99 100 101  CO2 26 25 27   BUN 26* 23 20  CREATININE 1.18* 1.03 1.04  CALCIUM 9.3  8.8 8.5  GLUCOSE 81 80 92    CBG (last 3)   Recent Labs  09/12/13 1139  GLUCAP 81    Scheduled Meds: . amoxicillin  500 mg Oral Q12H  . antiseptic oral rinse  15 mL Mouth Rinse BID  . [START ON 09/19/2013] buprenorphine  5 mcg Transdermal Weekly  . digoxin  0.0625 mg Oral q morning - 10a  . feeding supplement (ENSURE COMPLETE)  237 mL Oral BID BM  . furosemide  20 mg Oral Daily  . gabapentin  300 mg Oral QHS  . metoprolol  12.5 mg Oral BID  . metroNIDAZOLE  500 mg Oral 3 times per day  . mirtazapine  7.5 mg Oral QHS  . potassium chloride  40 mEq Oral Daily  . predniSONE  5 mg Oral q morning - 10a  . saccharomyces boulardii  250 mg Oral BID  . warfarin  4 mg Oral ONCE-1800  . Warfarin - Pharmacist Dosing Inpatient   Does not apply q1800    Continuous Infusions:   Past Medical History  Diagnosis Date  . Coronary artery disease   . Atrial fibrillation     Now NSR  . GERD (gastroesophageal reflux disease)   . Hyperlipidemia   . 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 Anemia   . Angina   . Hypertension   . Blood transfusion     "related to bad case of bronchitis once; 1 yr ago given due to GIB" (12/17/2012)  . H/O hiatal hernia   . Lupus     "that's compromised her lungs somewhat" (12/17/2012)  . Insomnia   . Endocarditis   . Prosthetic valve endocarditis   . Enterococcal bacteremia   . Heart murmur   . CHF (congestive heart failure)   . Pneumonia     "once or twice" (12/17/2012)  . Chronic bronchitis     "used to get it once/yr; hasn't had it since ~ 1986" (12/17/2012)  . Shortness of breath     "all the times sometimes" (12/17/2012)  . On home oxygen therapy     "2L prn; always at night" (12/17/2012)  . Lower GI bleed ~ 01/2012  . Arthritis     "hands" (12/17/2012)  . 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 Givens capsule study  04/09/2011    Procedure: GIVENS  CAPSULE STUDY;  Surgeon: Beryle Beams, MD;  Location: [facility];  Service: Endoscopy;  Laterality: N/A;  . Tee without cardioversion  02/06/2012    Procedure: TRANSESOPHAGEAL ECHOCARDIOGRAM (TEE);  Surgeon: Birdie Riddle, MD;  Location[facility] ENDOSCOPY;  Service: Cardiovascular;  Laterality: N/A;  . Esophagogastroduodenoscopy  02/12/2012    Procedure: ESOPHAGOGASTRODUODENOSCOPY (EGD);  Surgeon: Beryle Beams, MD;  Location: [facility];  Service: Endoscopy;  Laterality: N/A;  . Tee without cardioversion N/A 07/15/2012    Procedure: TRANSESOPHAGEAL ECHOCARDIOGRAM (TEE);  Surgeon: Birdie Riddle, MD;  Location: [facility] ENDOSCOPY;  Service: Cardiovascular;  Laterality: N/A;  . Breast lumpectomy Left 1977    "benign" (12/17/2012)  . Tonsillectomy  1940's  . Coronary artery bypass graft  2006  . 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   . Cardiac catheterization      "probably 2-3" (12/17/2012)  . Coronary angioplasty with stent placement      "several put in over the years" (12/17/2012)  . Cataract extraction w/ intraocular lens  implant, bilateral Bilateral 11/2000  . Peripherally inserted central catheter insertion Right 12/15/2012    "upper arm" (12/17/2012    Pryor Ochoa RD, LDN Inpatient Clinical Dietitian Pager: [phone] After Hours Pager: [phone]

## 2013-09-14 NOTE — Progress Notes (Signed)
[facility] PROGRESS NOTE  [name] XFG:[id number] DOB: 03-03-1941 DOA: 09/12/2013 PCP: Hollace Kinnier, DO  Assessment/Plan: Weakness/deconditioning: due to poor PO intake and decline of chronic problems. -will provide supportive care -optimize her conditions and as per PT rec's will discharge home with [facility] services   C. Diff colitis: -continue treatment with flagyl and florastor -diarrhea significantly improved  Endocarditis -Follow 2-d echo results -continue chronic amoxicillin for now  Acute on chronic systolic heart failure -mild exacerbation -will continue current PO lasix -low sodium diet, daily weight and strict intake and output  Acute on chronic renal failure -Renal function back to normal after lasix -will monitor  HTN: stable. Continue current medication regimen  Protein calorie malnutrition: severe -will start ensure BID -continue remeron and megace -follow rec's from nutrition service  Atrial fibrillation -Rate controlled and with lless episodes of bradycardia with adjusted dose of amiodarone and metoprolol -continue coumadin pe rpharmacy  Elevated INR -will give low dose Vit K X 1 given on 6/21 -INR therapeutic today 6/22 -will follow pharmacy rec's -flagyl can affect INR; if difficult to adjust, will change to oral vanc -no overt bleeding seen  Thrombocytopenia -chronic -will follow trend    Code Status: partial code  Family Communication: daughter at bedside  Disposition Plan: to be determine    Consultants:  Cardiology (Dr. Doylene Canard)  Procedures:  2-D echo: pending  Antibiotics:  Flagyl   HPI/Subjective: Feeling better and breathing better; no CP; reports no fever and slight improvement in appetite   Objective: Filed Vitals:   09/14/13 1432  BP: 122/65  Pulse: 58  Temp: 98.5 F (36.9 C)  Resp: 18    Intake/Output Summary (Last 24 hours) at 09/14/13 1649 Last data filed at 09/14/13 0853  Gross per 24 hour   Intake    800 ml  Output   1675 ml  Net   -875 ml   Filed Weights   09/12/13 1601 09/13/13 0515 09/14/13 0500  Weight: 62.6 kg (138 lb 0.1 oz) 63 kg (138 lb 14.2 oz) 62 kg (136 lb 11 oz)    Exam:   General:  Feeling better and breathing better; no CP; reports no fever and slight improvement in appetite   Cardiovascular: bradycardic, no rubs or gallops. 1+ edema bilaterally  Respiratory: no frank crackles appreciated; no wheezing  Abdomen: soft, NT, ND, positive BS  Musculoskeletal: no joint swelling; no cyanosis   Data Reviewed: Basic Metabolic Panel:  Recent Labs Lab 09/10/13 1455 09/12/13 1122 09/13/13 0320 09/14/13 0255  NA 142 140 141 140  K 3.6 3.6* 3.5* 3.1*  CL 101 99 100 101  CO2 25 26 25 27   GLUCOSE 91 81 80 92  BUN 29* 26* 23 20  CREATININE 1.46* 1.18* 1.03 1.04  CALCIUM 9.0 9.3 8.8 8.5   Liver Function Tests:  Recent Labs Lab 09/10/13 1455 09/13/13 0320  AST 20 23  ALT 10 11  ALKPHOS 101 103  BILITOT 1.7* 2.2*  PROT 6.1 6.5  ALBUMIN  --  2.8*   CBC:  Recent Labs Lab 09/10/13 1455 09/12/13 1122 09/13/13 0320 09/14/13 0255  WBC 4.8 5.4 3.7* 3.6*  NEUTROABS 3.0  --   --   --   HGB 14.0 14.4 13.4 11.9*  HCT 42.9 43.6 40.2 36.8  MCV 95 94.6 94.6 95.6  PLT  --  76* 63* 59*   Cardiac Enzymes:  Recent Labs Lab 09/12/13 1122  CKTOTAL 87  TROPONINI <0.30   BNP (last 3 results)  Recent Labs  09/12/13 1122  PROBNP 16828.0*   CBG:  Recent Labs Lab 09/12/13 1139  GLUCAP 81    Recent Results (from the past 240 hour(s))  STOOL CULTURE     Status: None   Collection Time    09/08/13  1:56 PM      Result Value Ref Range Status   Salmonella/Shigella Screen Final report   Final   RESULT 1 Comment   Final   Comment: No Salmonella or Shigella recovered.   Campylobacter Culture Final report   Final   RESULT 1 Comment   Final   Comment: No Campylobacter species isolated.   E coli, Shiga toxin Assay Negative  Negative Final   CLOSTRIDIUM DIFFICILE BY PCR     Status: Abnormal   Collection Time    09/08/13  2:02 PM      Result Value Ref Range Status   C difficile by pcr Positive (*) Negative Final   Comment: Toxigenic C difficile: Positive     Epidemic Strain Bl/NAP1/027: Presumptive Negative  CULTURE, BLOOD (SINGLE)     Status: None   Collection Time    09/10/13  2:54 PM      Result Value Ref Range Status   BLOOD CULTURE, ROUTINE Preliminary report   Preliminary   RESULT 1 Comment   Preliminary   Comment: No growth in 36 - 48 hours.  CULTURE, BLOOD (SINGLE)     Status: None   Collection Time    09/10/13  2:54 PM      Result Value Ref Range Status   BLOOD CULTURE, ROUTINE Preliminary report   Preliminary   RESULT 1 Comment   Preliminary   Comment: No growth in 36 - 48 hours.  URINE CULTURE     Status: None   Collection Time    09/12/13 12:49 PM      Result Value Ref Range Status   Specimen Description URINE, CLEAN CATCH   Final   Special Requests NONE   Final   Culture  Setup Time     Final   Value: 09/12/2013 19:00     Performed at [facility] Count     Final   Value: 6,000 COLONIES/ML     Performed at [facility]   Culture     Final   Value: INSIGNIFICANT GROWTH     Performed at [facility]   Report Status 09/13/2013 FINAL   Final     Studies: No results found.  Scheduled Meds: . amoxicillin  500 mg Oral Q12H  . antiseptic oral rinse  15 mL Mouth Rinse BID  . [START ON 09/19/2013] buprenorphine  5 mcg Transdermal Weekly  . digoxin  0.0625 mg Oral q morning - 10a  . furosemide  20 mg Oral Daily  . gabapentin  300 mg Oral QHS  . metoprolol  12.5 mg Oral BID  . metroNIDAZOLE  500 mg Oral 3 times per day  . mirtazapine  7.5 mg Oral QHS  . multivitamin with minerals  1 tablet Oral Daily  . potassium chloride  40 mEq Oral Daily  . predniSONE  5 mg Oral q morning - 10a  . saccharomyces boulardii  250 mg Oral BID  . warfarin  4 mg Oral ONCE-1800  .  Warfarin - Pharmacist Dosing Inpatient   Does not apply q1800   Continuous Infusions:   Principal Problem:   C. difficile colitis Active Problems:   RESTLESS LEG SYNDROME, MILD  Atrial fibrillation   Endocarditis   Lupus   CKD (chronic kidney disease) stage 3, GFR 30-59 ml/min    Time spent: >30 minutes   Barton Dubois  [facility] Pager [phone]. If 7PM-7AM, please contact night-coverage at [email or web address], password [facility] 09/14/2013, 4:49 PM  LOS: 2 days

## 2013-09-15 LAB — PROTIME-INR
INR: 1.75 — AB (ref 0.00–1.49)
Prothrombin Time: 19.9 seconds — ABNORMAL HIGH (ref 11.6–15.2)

## 2013-09-15 MED ORDER — WARFARIN SODIUM 3 MG PO TABS
3.0000 mg | ORAL_TABLET | Freq: Once | ORAL | Status: AC
  Administered 2013-09-15: 3 mg via ORAL
  Filled 2013-09-15: qty 1

## 2013-09-15 MED ORDER — METRONIDAZOLE 500 MG PO TABS: 500.0000 mg | ORAL_TABLET | Freq: Three times a day (TID) | ORAL | Status: AC

## 2013-09-15 MED ORDER — WARFARIN SODIUM 4 MG PO TABS: 2.0000 mg | ORAL_TABLET | Freq: Every day | ORAL | Status: DC

## 2013-09-15 MED ORDER — FAMOTIDINE 20 MG PO TABS: 20.0000 mg | ORAL_TABLET | Freq: Two times a day (BID) | ORAL | Status: DC

## 2013-09-15 MED ORDER — POTASSIUM CHLORIDE CRYS ER 20 MEQ PO TBCR
40.0000 meq | EXTENDED_RELEASE_TABLET | Freq: Once | ORAL | Status: AC
  Administered 2013-09-15: 40 meq via ORAL
  Filled 2013-09-15: qty 2

## 2013-09-15 MED ORDER — MEGESTROL ACETATE 400 MG/10ML PO SUSP: 400.0000 mg | Freq: Every day | ORAL | Status: DC

## 2013-09-15 MED ORDER — AMOXICILLIN 500 MG PO CAPS: 500.0000 mg | ORAL_CAPSULE | Freq: Two times a day (BID) | ORAL | Status: AC

## 2013-09-15 NOTE — Discharge Summary (Signed)
Physician Discharge Summary  [name] TWS:[id number] DOB: 12-12-1940 DOA: 09/12/2013  PCP: Hollace Kinnier, DO  Admit date: 09/12/2013 Discharge date: 09/15/2013  Time spent: >30 minutes  Recommendations for Outpatient Follow-up:  BMET to follow electrolytes and renal function Will need follow up with cardiology for atrial fibrillation meds (as she has been experiencing bradycardia) Also follow up with ID for further decisions on chronic endocarditis    Discharge Diagnoses:  Principal Problem:   C. difficile colitis Active Problems:   RESTLESS LEG SYNDROME, MILD   Atrial fibrillation   Endocarditis   Lupus   CKD (chronic kidney disease) stage 3, GFR 30-59 ml/min   Discharge Condition: stable and improved. discharge home with family care and home health services. Follow up with PCP in 10 days  Diet recommendation: low sodium diet  Filed Weights   09/13/13 0515 09/14/13 0500 09/15/13 0437  Weight: 63 kg (138 lb 14.2 oz) 62 kg (136 lb 11 oz) 60.873 kg (134 lb 3.2 oz)    History of present illness:  73 y.o. 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 She is brought in by her daughter due to not walking 4-5 days. She has had diarrhea for 2 months and some occasional vomiting (once or twice a week). Diarrhea is very bad now and she is incontinent of stool. Her PCP perfomed a C diff PCR on 6 /16 which is found to be positive by the ER doc today.  Furthermore, she has worsening pedal edema. Lasix was prescribed by Dr Clover Mealy as outpt. Per the daughter and her girlfriend, she suspected the edema was coming from protein malnutrition. It helped the edema initially but over the past few days, despite laying in be (keeping feet up) they are more swollen.  In addition, she has a new diastolic murmur and there are concerns by her PCP for worsening endocarditis. Her cardiologist is Dr Terrence Dupont.     Hospital Course:  Weakness/deconditioning: due to poor PO intake and further declining of chronic problems.  -continue supportive care and assistance -per PT rec's will discharge home with [facility] services ([place] and HHPT)  C. Diff colitis:  -continue treatment with flagyl and florastor  -diarrhea significantly improved -will need another 11 days.   Endocarditis  -2-d echo results w/o visible vegetations; patient also afebrile and w/o elevated WBC's -discussed with ID and the plan is to continue chronic amoxicillin for now  -she will follow up with Dr. Tommy Medal in outpatient setting  Acute on chronic systolic heart failure  -mild exacerbation on admission; now compensated -will continue current PO lasix  -low sodium diet, daily weight and fluid restriction discussed with patient and family   Acute on chronic renal failure  -Renal function back to normal after lasix  -will monitor   HTN: stable. Continue current medication regimen   Protein calorie malnutrition: severe  -will continue megace, boost BID and carnation essential   -continue remeron   -follow rec's from nutrition service   Atrial fibrillation  -Rate controlled; but still with episodes of bradycardia  -after discussing with cardiology will stop digoxin and metoprolol for now  -continue coumadin   Elevated INR  -will give low dose Vit K X 1 given on 6/21  -INR therapeutic today 6/22  -will need close follow up of INR given use of flagyl -no overt bleeding seen   Thrombocytopenia  -chronic and stable -will follow trend  Hypokalemia: from diarrhea and diuresis -repleted and discharge with potassium supplementation   Procedures:  2-D echo - Left ventricle: The cavity size was normal. There was mild concentric hypertrophy. Systolic function was normal. The estimated ejection fraction was in the range of 55% to 60%. Left ventricular diastolic function parameters were normal. - Aortic valve: A bioprosthesis  was present. The sewing ring showed no evidence of dehiscence. Cannot exclude vegetation. There was mild regurgitation. There was mild perivalvular regurgitation. Valve area (VTI): 1.85 cm^2. Valve area (Vmax): 1.45 cm^2. - Mitral valve: Cannot exclude vegetation. There was moderate regurgitation. - Left atrium: The atrium was moderately dilated. - Right atrium: The atrium was mildly dilated. - Atrial septum: No defect or patent foramen ovale was identified. - Tricuspid valve: There was moderate regurgitation. - Pulmonary arteries: Systolic pressure was severely increased. PA peak pressure: 81 mm Hg (S).   Consultations:  Cardiology   Discharge Exam: Filed Vitals:   09/15/13 0943  BP: 121/52  Pulse: 48  Temp:   Resp: 20   General: Feeling better and breathing better; no CP; reports no fever and slight improvement in appetite Cardiovascular: bradycardic, no rubs or gallops. 1+ edema bilaterally  Respiratory: no frank crackles appreciated; no wheezing  Abdomen: soft, NT, ND, positive BS  Musculoskeletal: no joint swelling; no cyanosis    Discharge Instructions You were cared for by a hospitalist during your hospital stay. If you have any questions about your discharge medications or the care you received while you were in the hospital after you are discharged, you can call the unit and asked to speak with the hospitalist on call if the hospitalist that took care of you is not available. Once you are discharged, your primary care physician will handle any further medical issues. Please note that NO REFILLS for any discharge medications will be authorized once you are discharged, as it is imperative that you return to your primary care physician (or establish a relationship with a primary care physician if you do not have one) for your aftercare needs so that they can reassess your need for medications and monitor your lab values.  Discharge Instructions   Diet - low sodium heart  healthy    Complete by:  As directed      Discharge instructions    Complete by:  As directed   Arrange follow up with PCP in 1 week Call infectious disease office to set up follow up for endocarditis  Check INR on Friday (contact Dr. Terrence Dupont office for that) Take medications as prescribed Please increase intake and follow recommendations from nutritional service (carnation breakfast essential once a day; Boost BID and daily multivitamin)            Medication List    STOP taking these medications       ciprofloxacin 500 MG tablet  Commonly known as:  CIPRO     digoxin 0.125 MG tablet  Commonly known as:  LANOXIN     metoprolol 50 MG tablet  Commonly known as:  LOPRESSOR     pantoprazole 40 MG tablet  Commonly known as:  PROTONIX     zoster vaccine live (PF) 19400 UNT/0.65ML injection  Commonly known as:  ZOSTAVAX      TAKE these medications       amoxicillin 500 MG capsule  Commonly known as:  AMOXIL  Take 1 capsule (500 mg total) by mouth every 12 (twelve) hours.     buprenorphine 5 MCG/HR Ptwk patch  Commonly  known as:  BUTRANS  Place 1 patch (5 mcg total) onto the skin once a week.     famotidine 20 MG tablet  Commonly known as:  PEPCID  Take 1 tablet (20 mg total) by mouth 2 (two) times daily.     furosemide 20 MG tablet  Commonly known as:  LASIX  Take 20 mg by mouth 2 (two) times daily.     gabapentin 300 MG capsule  Commonly known as:  NEURONTIN  Take 300 mg by mouth at bedtime.     hydroxychloroquine 200 MG tablet  Commonly known as:  PLAQUENIL  Take 200 mg by mouth 2 (two) times daily.     IRON PO  Take 1 tablet by mouth 2 (two) times daily.     megestrol 400 MG/10ML suspension  Commonly known as:  MEGACE  Take 10 mLs (400 mg total) by mouth daily.     metroNIDAZOLE 500 MG tablet  Commonly known as:  FLAGYL  Take 1 tablet (500 mg total) by mouth every 8 (eight) hours.     mirtazapine 7.5 MG tablet  Commonly known as:  REMERON  Take 1  tablet (7.5 mg total) by mouth at bedtime. For appetite     potassium chloride SA 20 MEQ tablet  Commonly known as:  K-DUR,KLOR-CON  Take 40-60 mEq by mouth 2 (two) times daily. Takes 2 tablets every morning and takes 3 tablets every evening.     predniSONE 5 MG tablet  Commonly known as:  DELTASONE  Take 1 tablet (5 mg total) by mouth every morning.     rosuvastatin 10 MG tablet  Commonly known as:  CRESTOR  Take 10 mg by mouth every morning.     saccharomyces boulardii 250 MG capsule  Commonly known as:  FLORASTOR  Take 1 capsule (250 mg total) by mouth 2 (two) times daily.     warfarin 4 MG tablet  Commonly known as:  COUMADIN  Take 0.5 tablets (2 mg total) by mouth daily. Takes 4mg  (1tablet) daily and follow INR on Friday to determine further adjustments on coumadin dose       Allergies  Allergen Reactions  . Ceftriaxone     RASH BUT TOLERATED AMPICILLIN AND IMIPENEM WITHOUT PROBLEMS  . Codeine Nausea Only       Follow-up Information   Follow up with Alcide Evener, MD. (call office to set up follow up appointment)    Specialty:  Infectious Diseases   Contact information:   [address] [place] [address] [phone]       Follow up with REED, TIFFANY, DO.   Specialty:  Geriatric Medicine   Contact information:   [address] [place] [address] [phone]       Follow up with Clent Demark, MD. (contact office for follow up appointment; you needs INR recheck on Firday 09/18/13)    Specialty:  Cardiology   Contact information:   [address] [place] [address] [phone]        The results of significant diagnostics from this hospitalization (including imaging, microbiology, ancillary and laboratory) are listed below for reference.    Significant Diagnostic Studies: Dg Chest 2 View  09/12/2013   CLINICAL DATA:  Shortness of breath  EXAM: CHEST  2 VIEW  COMPARISON:  07/21/2012  FINDINGS:  Chronic cardiopericardial enlargement with notable pulmonary arterial enlargement, likely from pulmonary hypertension. Bulky mitral annular calcification. 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 Coronary stents noted.  There is elevation the anterior right diaphragm, often from previous median sternotomy. No consolidation, edema, effusion, or pneumothorax.  IMPRESSION: No active cardiopulmonary disease.   Electronically Signed   By: Jorje Guild M.D.   On: 09/12/2013 13:13    Microbiology: Recent Results (from the past 240 hour(s))  STOOL CULTURE     Status: None   Collection Time    09/08/13  1:56 PM      Result Value Ref Range Status   Salmonella/Shigella Screen Final report   Final   RESULT 1 Comment   Final   Comment: No Salmonella or Shigella recovered.   Campylobacter Culture Final report   Final   RESULT 1 Comment   Final   Comment: No Campylobacter species isolated.   E coli, Shiga toxin Assay Negative  Negative Final  CLOSTRIDIUM DIFFICILE BY PCR     Status: Abnormal   Collection Time    09/08/13  2:02 PM      Result Value Ref Range Status   C difficile by pcr Positive (*) Negative Final   Comment: Toxigenic C difficile: Positive     Epidemic Strain Bl/NAP1/027: Presumptive Negative  CULTURE, BLOOD (SINGLE)     Status: None   Collection Time    09/10/13  2:54 PM      Result Value Ref Range Status   BLOOD CULTURE, ROUTINE Preliminary report   Preliminary   RESULT 1 Comment   Preliminary   Comment: No growth in 36 - 48 hours.  CULTURE, BLOOD (SINGLE)     Status: None   Collection Time    09/10/13  2:54 PM      Result Value Ref Range Status   BLOOD CULTURE, ROUTINE Preliminary report   Preliminary   RESULT 1 Comment   Preliminary   Comment: No growth in 36 - 48 hours.  URINE CULTURE     Status: None   Collection Time    09/12/13 12:49 PM      Result Value Ref Range Status   Specimen Description URINE, CLEAN CATCH   Final   Special Requests NONE   Final   Culture   Setup Time     Final   Value: 09/12/2013 19:00     Performed at [facility]     Final   Value: 6,000 COLONIES/ML     Performed at [facility]   Culture     Final   Value: INSIGNIFICANT GROWTH     Performed at [facility]   Report Status 09/13/2013 FINAL   Final     Labs: Basic Metabolic Panel:  Recent Labs Lab 09/10/13 1455 09/12/13 1122 09/13/13 0320 09/14/13 0255  NA 142 140 141 140  K 3.6 3.6* 3.5* 3.1*  CL 101 99 100 101  CO2 25 26 25 27   GLUCOSE 91 81 80 92  BUN 29* 26* 23 20  CREATININE 1.46* 1.18* 1.03 1.04  CALCIUM 9.0 9.3 8.8 8.5   Liver Function Tests:  Recent Labs Lab 09/10/13 1455 09/13/13 0320  AST 20 23  ALT 10 11  ALKPHOS 101 103  BILITOT 1.7* 2.2*  PROT 6.1 6.5  ALBUMIN  --  2.8*   CBC:  Recent Labs Lab 09/10/13 1455 09/12/13 1122 09/13/13 0320 09/14/13 0255  WBC 4.8 5.4 3.7* 3.6*  NEUTROABS 3.0  --   --   --   HGB 14.0 14.4 13.4 11.9*  HCT 42.9 43.6 40.2  36.8  MCV 95 94.6 94.6 95.6  PLT  --  76* 63* 59*   Cardiac Enzymes:  Recent Labs Lab 09/12/13 1122  CKTOTAL 87  TROPONINI <0.30   BNP: BNP (last 3 results)  Recent Labs  09/12/13 1122  PROBNP 16828.0*   CBG:  Recent Labs Lab 09/12/13 1139  GLUCAP 81    Signed:  Barton Dubois  [facility] 09/15/2013, 2:06 PM

## 2013-09-15 NOTE — Progress Notes (Signed)
ANTICOAGULATION CONSULT NOTE - Follow Up Consult  Pharmacy Consult for Warfarin Indication: atrial fibrillation  Allergies  Allergen Reactions  . Ceftriaxone     RASH BUT TOLERATED AMPICILLIN AND IMIPENEM WITHOUT PROBLEMS  . Codeine Nausea Only    Patient Measurements: Height: 5\' 6"  (167.6 cm) Weight: 134 lb 3.2 oz (60.873 kg) (scale C) IBW/kg (Calculated) : 59.3  Vital Signs: Temp: 97.9 F (36.6 C) (06/23 0626) Temp src: Oral (06/23 0626) BP: 121/52 mmHg (06/23 0943) Pulse Rate: 48 (06/23 0943)  Labs:  Recent Labs  09/12/13 1122  09/13/13 0320 09/14/13 0255 09/15/13 0355  HGB 14.4  --  13.4 11.9*  --   HCT 43.6  --  40.2 36.8  --   PLT 76*  --  63* 59*  --   LABPROT  --   < > 59.4* 25.7* 19.9*  INR  --   < > 7.33* 2.44* 1.75*  CREATININE 1.18*  --  1.03 1.04  --   CKTOTAL 87  --   --   --   --   TROPONINI <0.30  --   --   --   --   < > = values in this interval not displayed.  Estimated Creatinine Clearance: 43.8 ml/min (by C-G formula based on Cr of 1.04).   Medications:  Scheduled:  . amoxicillin  500 mg Oral Q12H  . antiseptic oral rinse  15 mL Mouth Rinse BID  . [START ON 09/19/2013] buprenorphine  5 mcg Transdermal Weekly  . furosemide  20 mg Oral Daily  . gabapentin  300 mg Oral QHS  . megestrol  400 mg Oral Daily  . metoprolol  12.5 mg Oral BID  . metroNIDAZOLE  500 mg Oral 3 times per day  . mirtazapine  7.5 mg Oral QHS  . multivitamin with minerals  1 tablet Oral Daily  . potassium chloride  40 mEq Oral Daily  . predniSONE  5 mg Oral q morning - 10a  . saccharomyces boulardii  250 mg Oral BID  . Warfarin - Pharmacist Dosing Inpatient   Does not apply q1800    Assessment: 73 YOF on warfarin PTA for hx Afib - admitted with a SUPRAtherapeutic INR of 8.33 on admission. PTA warfarin dose was 4 mg daily EXCEPT for 6 mg on Wed/Fri/Sat. 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 Both of these are known to increase warfarin sensitivity. The patient is also noted to have a poor appetite which will also elevate INR.    Patient received vitamin K 5mg  po x1 on 6/21. INR this morning is 1.75- this is fully reflective of vitamin K that was given, effects of warfarin 4mg  po x1 that was given yesterday evening not yet seen in INR.  Note patient is on Flagyl for CDiff treatment this admission - this will increase warfarin sensitivity which was discussed with Dr. Dyann Kief by another pharmacist- planning to continue Flagyl for 14 days of treatment (7/3).  History of thrombocytopenia- platelets have trended down here. Hemoglobin dropped slightly yesterday. No overt bleeding noted.  Goal of Therapy:  INR 2-3 Monitor platelets by anticoagulation protocol: Yes   Plan:  1. Warfarin 3 mg x 1 today 2. Discussed during progression rounds and likely d/c today- recommend patient take warfarin 2mg  po daily starting 6/24 with INR follow up on 6/26. She will need  to be monitored closely while she is taking Flagyl. 3. Daily INR while in hospital 4. CBC tomorrow if still here  Lauren D. Bajbus, PharmD, BCPS Clinical Pharmacist Pager: [phone] 09/15/2013 9:59 AM

## 2013-09-15 NOTE — Progress Notes (Signed)
Subjective:  Patient denies any chest pain or shortness of breath. Denies any dizziness had episode of A. fib with slow ventricular response asymptomatic. Denies any fever or chills. Denies any urinary complaints. Blood cultures so far has been negative 2-D echo transthoracic could not rule out evidence of vegetation  Objective:  Vital Signs in the last 24 hours: Temp:  [97.9 F (36.6 C)-98.5 F (36.9 C)] 97.9 F (36.6 C) (06/23 0626) Pulse Rate:  [43-61] 48 (06/23 0943) Resp:  [18-20] 20 (06/23 0943) BP: (121-155)/(44-66) 121/52 mmHg (06/23 0943) SpO2:  [94 %-96 %] 94 % (06/23 0943) Weight:  [60.873 kg (134 lb 3.2 oz)] 60.873 kg (134 lb 3.2 oz) (06/23 0437)  Intake/Output from previous day: 06/22 0701 - 06/23 0700 In: 480 [P.O.:480] Out: 600 [Urine:600] Intake/Output from this shift: Total I/O In: 600 [P.O.:600] Out: -   Physical Exam: Neck: no adenopathy, no carotid bruit, no JVD and supple, symmetrical, trachea midline Lungs: Decreased breath sound at bases Heart: irregularly irregular rhythm, S1, S2 normal and Soft systolic and diastolic murmur noted Abdomen: soft, non-tender; bowel sounds normal; no masses,  no organomegaly Extremities: extremities normal, atraumatic, no cyanosis or edema  Lab Results:  Recent Labs  09/13/13 0320 09/14/13 0255  WBC 3.7* 3.6*  HGB 13.4 11.9*  PLT 63* 59*    Recent Labs  09/13/13 0320 09/14/13 0255  NA 141 140  K 3.5* 3.1*  CL 100 101  CO2 25 27  GLUCOSE 80 92  BUN 23 20  CREATININE 1.03 1.04    Recent Labs  09/12/13 1122  TROPONINI <0.30   Hepatic Function Panel  Recent Labs  09/13/13 0320  PROT 6.5  ALBUMIN 2.8*  AST 23  ALT 11  ALKPHOS 103  BILITOT 2.2*   No results found for this basename: CHOL,  in the last 72 hours No results found for this basename: PROTIME,  in the last 72 hours  Imaging: Imaging results have been reviewed and No results found.  Cardiac Studies:  Assessment/Plan:  Resolving  C. 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 DC beta blockers and digoxin  LOS: 3 days    [name] N 09/15/2013, 10:05 AM

## 2013-09-15 NOTE — Progress Notes (Signed)
Pt HR brady to 35 this AM when asleep. Pt with history of this on this admission. Pt aymptomatic. VSS.

## 2013-09-16 LAB — CULTURE, BLOOD (SINGLE)

## 2013-09-17 DIAGNOSIS — M329 Systemic lupus erythematosus, unspecified: Secondary | ICD-10-CM

## 2013-09-17 DIAGNOSIS — A0472 Enterocolitis due to Clostridium difficile, not specified as recurrent: Secondary | ICD-10-CM

## 2013-09-17 DIAGNOSIS — R634 Abnormal weight loss: Secondary | ICD-10-CM

## 2013-09-17 DIAGNOSIS — IMO0001 Reserved for inherently not codable concepts without codable children: Secondary | ICD-10-CM

## 2013-09-28 ENCOUNTER — Ambulatory Visit (INDEPENDENT_AMBULATORY_CARE_PROVIDER_SITE_OTHER): Payer: Medicare Other | Admitting: Internal Medicine

## 2013-09-28 ENCOUNTER — Encounter: Payer: Self-pay | Admitting: Internal Medicine

## 2013-09-28 VITALS — BP 122/80 | HR 65 | Temp 97.4°F | Resp 12 | Wt 150.0 lb

## 2013-09-28 DIAGNOSIS — R634 Abnormal weight loss: Secondary | ICD-10-CM

## 2013-09-28 DIAGNOSIS — I38 Endocarditis, valve unspecified: Secondary | ICD-10-CM

## 2013-09-28 DIAGNOSIS — R627 Adult failure to thrive: Secondary | ICD-10-CM

## 2013-09-28 DIAGNOSIS — W06XXXA Fall from bed, initial encounter: Secondary | ICD-10-CM

## 2013-09-28 DIAGNOSIS — A0472 Enterocolitis due to Clostridium difficile, not specified as recurrent: Secondary | ICD-10-CM

## 2013-09-28 DIAGNOSIS — I5022 Chronic systolic (congestive) heart failure: Secondary | ICD-10-CM

## 2013-09-28 DIAGNOSIS — I2510 Atherosclerotic heart disease of native coronary artery without angina pectoris: Secondary | ICD-10-CM
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
## 2013-09-29 LAB — BASIC METABOLIC PANEL
BUN/Creatinine Ratio: 22 (ref 11–26)
BUN: 25 mg/dL (ref 8–27)
CO2: 26 mmol/L (ref 18–29)
Calcium: 8.6 mg/dL — ABNORMAL LOW (ref 8.7–10.3)
Chloride: 100 mmol/L (ref 97–108)
Creatinine, Ser: 1.13 mg/dL — ABNORMAL HIGH (ref 0.57–1.00)
GFR calc Af Amer: 55 mL/min/{1.73_m2} — ABNORMAL LOW (ref >59)
GFR calc non Af Amer: 48 mL/min/{1.73_m2} — ABNORMAL LOW (ref >59)
Glucose: 91 mg/dL (ref 65–99)
Potassium: 2.9 mmol/L — ABNORMAL LOW (ref 3.5–5.2)
Sodium: 139 mmol/L (ref 134–144)

## 2013-10-12 ENCOUNTER — Telehealth: Payer: Self-pay | Admitting: *Deleted

## 2013-10-12 NOTE — Telephone Encounter (Signed)
FYI ---Caregiver, Beth, Called and stated that patient had a fall over the weekend.Bruise on Left jaw and sore. Denies any dizziness and denies balance lost. Patient is non compliant with using assistance device.

## 2013-10-14 NOTE — Telephone Encounter (Signed)
LMOM to return call.

## 2013-10-14 NOTE — Telephone Encounter (Signed)
I recommend they alternate heat and ice over her jaw.  If she continues to have pain and swelling there that is unremitting, she may need an xray.  Please advise [name] to remind the patient that I recommend she use her walker so she doesn't break any bones.

## 2013-10-14 NOTE — Telephone Encounter (Signed)
Beth states that patient is ok. She has a bruised jaw but doing fine. Will remind her to use her walker.

## 2013-10-19 ENCOUNTER — Other Ambulatory Visit: Payer: Self-pay | Admitting: *Deleted

## 2013-10-19 MED ORDER — GABAPENTIN 300 MG PO CAPS: 300.0000 mg | ORAL_CAPSULE | Freq: Every day | ORAL | Status: DC

## 2013-10-19 MED ORDER — FAMOTIDINE 20 MG PO TABS: 20.0000 mg | ORAL_TABLET | Freq: Two times a day (BID) | ORAL | Status: DC

## 2013-10-19 NOTE — Telephone Encounter (Signed)
[employer] 

## 2013-10-27 ENCOUNTER — Other Ambulatory Visit: Payer: Self-pay | Admitting: *Deleted

## 2013-10-27 MED ORDER — GABAPENTIN 300 MG PO CAPS: 300.0000 mg | ORAL_CAPSULE | Freq: Every day | ORAL | Status: DC

## 2013-10-27 MED ORDER — FAMOTIDINE 20 MG PO TABS: 20.0000 mg | ORAL_TABLET | Freq: Two times a day (BID) | ORAL | Status: AC

## 2013-10-27 NOTE — Telephone Encounter (Signed)
Patient Requested 

## 2013-11-07 ENCOUNTER — Inpatient Hospital Stay (HOSPITAL_COMMUNITY): Payer: Medicare Other

## 2013-11-07 ENCOUNTER — Emergency Department (HOSPITAL_COMMUNITY): Payer: Medicare Other

## 2013-11-07 ENCOUNTER — Encounter (HOSPITAL_COMMUNITY): Payer: Self-pay | Admitting: Emergency Medicine

## 2013-11-07 ENCOUNTER — Inpatient Hospital Stay (HOSPITAL_COMMUNITY)
Admission: EM | Admit: 2013-11-07 | Discharge: 2013-11-13 | DRG: 871 | Disposition: A | Discharge status: Home Health | Payer: Medicare Other | Attending: Internal Medicine | Admitting: Internal Medicine

## 2013-11-07 DIAGNOSIS — I2510 Atherosclerotic heart disease of native coronary artery without angina pectoris: Secondary | ICD-10-CM | POA: Diagnosis present

## 2013-11-07 DIAGNOSIS — I5032 Chronic diastolic (congestive) heart failure: Secondary | ICD-10-CM | POA: Diagnosis present

## 2013-11-07 DIAGNOSIS — E875 Hyperkalemia: Secondary | ICD-10-CM

## 2013-11-07 DIAGNOSIS — I509 Heart failure, unspecified: Secondary | ICD-10-CM | POA: Diagnosis present

## 2013-11-07 DIAGNOSIS — R7401 Elevation of levels of liver transaminase levels: Secondary | ICD-10-CM

## 2013-11-07 DIAGNOSIS — I330 Acute and subacute infective endocarditis: Secondary | ICD-10-CM | POA: Diagnosis present

## 2013-11-07 DIAGNOSIS — Z952 Presence of prosthetic heart valve: Secondary | ICD-10-CM

## 2013-11-07 DIAGNOSIS — Z9849 Cataract extraction status, unspecified eye: Secondary | ICD-10-CM | POA: Diagnosis not present

## 2013-11-07 DIAGNOSIS — D649 Anemia, unspecified: Secondary | ICD-10-CM | POA: Diagnosis present

## 2013-11-07 DIAGNOSIS — M329 Systemic lupus erythematosus, unspecified: Secondary | ICD-10-CM | POA: Diagnosis present

## 2013-11-07 DIAGNOSIS — K644 Residual hemorrhoidal skin tags: Secondary | ICD-10-CM | POA: Diagnosis present

## 2013-11-07 DIAGNOSIS — I129 Hypertensive chronic kidney disease with stage 1 through stage 4 chronic kidney disease, or unspecified chronic kidney disease: Secondary | ICD-10-CM | POA: Diagnosis present

## 2013-11-07 DIAGNOSIS — A419 Sepsis, unspecified organism: Secondary | ICD-10-CM | POA: Diagnosis present

## 2013-11-07 DIAGNOSIS — I4510 Unspecified right bundle-branch block: Secondary | ICD-10-CM | POA: Diagnosis present

## 2013-11-07 DIAGNOSIS — Z8249 Family history of ischemic heart disease and other diseases of the circulatory system: Secondary | ICD-10-CM | POA: Diagnosis not present

## 2013-11-07 DIAGNOSIS — Z881 Allergy status to other antibiotic agents status: Secondary | ICD-10-CM

## 2013-11-07 DIAGNOSIS — Z6827 Body mass index (BMI) 27.0-27.9, adult: Secondary | ICD-10-CM | POA: Diagnosis not present

## 2013-11-07 DIAGNOSIS — E872 Acidosis, unspecified: Secondary | ICD-10-CM

## 2013-11-07 DIAGNOSIS — Z66 Do not resuscitate: Secondary | ICD-10-CM | POA: Diagnosis present

## 2013-11-07 DIAGNOSIS — E874 Mixed disorder of acid-base balance: Secondary | ICD-10-CM | POA: Diagnosis present

## 2013-11-07 DIAGNOSIS — Z7901 Long term (current) use of anticoagulants: Secondary | ICD-10-CM

## 2013-11-07 DIAGNOSIS — B952 Enterococcus as the cause of diseases classified elsewhere: Secondary | ICD-10-CM | POA: Diagnosis present

## 2013-11-07 DIAGNOSIS — Z9981 Dependence on supplemental oxygen: Secondary | ICD-10-CM | POA: Diagnosis not present

## 2013-11-07 DIAGNOSIS — R0603 Acute respiratory distress: Secondary | ICD-10-CM

## 2013-11-07 DIAGNOSIS — N183 Chronic kidney disease, stage 3 unspecified: Secondary | ICD-10-CM

## 2013-11-07 DIAGNOSIS — I4891 Unspecified atrial fibrillation: Secondary | ICD-10-CM

## 2013-11-07 DIAGNOSIS — R6520 Severe sepsis without septic shock: Secondary | ICD-10-CM

## 2013-11-07 DIAGNOSIS — E785 Hyperlipidemia, unspecified: Secondary | ICD-10-CM | POA: Diagnosis present

## 2013-11-07 DIAGNOSIS — M069 Rheumatoid arthritis, unspecified: Secondary | ICD-10-CM | POA: Diagnosis present

## 2013-11-07 DIAGNOSIS — J9600 Acute respiratory failure, unspecified whether with hypoxia or hypercapnia: Secondary | ICD-10-CM | POA: Diagnosis present

## 2013-11-07 DIAGNOSIS — D62 Acute posthemorrhagic anemia: Secondary | ICD-10-CM

## 2013-11-07 DIAGNOSIS — I38 Endocarditis, valve unspecified: Secondary | ICD-10-CM

## 2013-11-07 DIAGNOSIS — R0989 Other specified symptoms and signs involving the circulatory and respiratory systems: Secondary | ICD-10-CM

## 2013-11-07 DIAGNOSIS — K7200 Acute and subacute hepatic failure without coma: Secondary | ICD-10-CM | POA: Diagnosis present

## 2013-11-07 DIAGNOSIS — K219 Gastro-esophageal reflux disease without esophagitis: Secondary | ICD-10-CM | POA: Diagnosis present

## 2013-11-07 DIAGNOSIS — Z951 Presence of aortocoronary bypass graft: Secondary | ICD-10-CM | POA: Diagnosis not present

## 2013-11-07 DIAGNOSIS — Z79899 Other long term (current) drug therapy: Secondary | ICD-10-CM | POA: Diagnosis not present

## 2013-11-07 DIAGNOSIS — A088 Other specified intestinal infections: Secondary | ICD-10-CM | POA: Diagnosis present

## 2013-11-07 DIAGNOSIS — R0902 Hypoxemia: Secondary | ICD-10-CM

## 2013-11-07 DIAGNOSIS — R0602 Shortness of breath: Secondary | ICD-10-CM

## 2013-11-07 DIAGNOSIS — Z9861 Coronary angioplasty status: Secondary | ICD-10-CM | POA: Diagnosis not present

## 2013-11-07 DIAGNOSIS — I5022 Chronic systolic (congestive) heart failure: Secondary | ICD-10-CM

## 2013-11-07 DIAGNOSIS — IMO0002 Reserved for concepts with insufficient information to code with codable children: Secondary | ICD-10-CM

## 2013-11-07 DIAGNOSIS — R5381 Other malaise: Secondary | ICD-10-CM | POA: Diagnosis present

## 2013-11-07 DIAGNOSIS — R5382 Chronic fatigue, unspecified: Secondary | ICD-10-CM

## 2013-11-07 DIAGNOSIS — E162 Hypoglycemia, unspecified: Secondary | ICD-10-CM | POA: Diagnosis present

## 2013-11-07 DIAGNOSIS — E43 Unspecified severe protein-calorie malnutrition: Secondary | ICD-10-CM | POA: Diagnosis present

## 2013-11-07 DIAGNOSIS — R0609 Other forms of dyspnea: Secondary | ICD-10-CM

## 2013-11-07 DIAGNOSIS — R740 Nonspecific elevation of levels of transaminase and lactic acid dehydrogenase [LDH]: Secondary | ICD-10-CM

## 2013-11-07 DIAGNOSIS — N179 Acute kidney failure, unspecified: Secondary | ICD-10-CM

## 2013-11-07 DIAGNOSIS — R1110 Vomiting, unspecified: Secondary | ICD-10-CM

## 2013-11-07 DIAGNOSIS — J8410 Pulmonary fibrosis, unspecified: Secondary | ICD-10-CM

## 2013-11-07 HISTORY — DX: Unspecified right bundle-branch block: I45.10

## 2013-11-07 LAB — COMPREHENSIVE METABOLIC PANEL
ALBUMIN: 3.6 g/dL (ref 3.5–5.2)
ALT: 179 U/L — ABNORMAL HIGH (ref 0–35)
ANION GAP: 26 — AB (ref 5–15)
AST: 244 U/L — AB (ref 0–37)
Alkaline Phosphatase: 170 U/L — ABNORMAL HIGH (ref 39–117)
BILIRUBIN TOTAL: 2.7 mg/dL — AB (ref 0.3–1.2)
BUN: 25 mg/dL — ABNORMAL HIGH (ref 6–23)
CHLORIDE: 106 meq/L (ref 96–112)
CO2: 10 mEq/L — CL (ref 19–32)
CREATININE: 1.65 mg/dL — AB (ref 0.50–1.10)
Calcium: 10.1 mg/dL (ref 8.4–10.5)
GFR calc Af Amer: 34 mL/min — ABNORMAL LOW (ref >90)
GFR calc non Af Amer: 29 mL/min — ABNORMAL LOW (ref >90)
Glucose, Bld: 32 mg/dL — CL (ref 70–99)
Potassium: 6.4 mEq/L — ABNORMAL HIGH (ref 3.7–5.3)
Sodium: 142 mEq/L (ref 137–147)
TOTAL PROTEIN: 7.8 g/dL (ref 6.0–8.3)

## 2013-11-07 LAB — I-STAT ARTERIAL BLOOD GAS, ED
Acid-base deficit: 17 mmol/L — ABNORMAL HIGH (ref 0.0–2.0)
Acid-base deficit: 10 mmol/L — ABNORMAL HIGH (ref 0.0–2.0)
Bicarbonate: 11.7 mEq/L — ABNORMAL LOW (ref 20.0–24.0)
Bicarbonate: 13.6 mEq/L — ABNORMAL LOW (ref 20.0–24.0)
O2 Saturation: 43 %
O2 Saturation: 100 %
PCO2 ART: 24.4 mmHg — AB (ref 35.0–45.0)
PO2 ART: 32 mmHg — AB (ref 80.0–100.0)
Patient temperature: 98.6
Patient temperature: 98.6
TCO2: 13 mmol/L (ref 0–100)
TCO2: 14 mmol/L (ref 0–100)
pCO2 arterial: 37.8 mmHg (ref 35.0–45.0)
pH, Arterial: 7.098 — CL (ref 7.350–7.450)
pH, Arterial: 7.353 (ref 7.350–7.450)
pO2, Arterial: 214 mmHg — ABNORMAL HIGH (ref 80.0–100.0)

## 2013-11-07 LAB — PRO B NATRIURETIC PEPTIDE: Pro B Natriuretic peptide (BNP): 24587 pg/mL — ABNORMAL HIGH (ref 0–450)

## 2013-11-07 LAB — MAGNESIUM: MAGNESIUM: 2.2 mg/dL (ref 1.5–2.5)

## 2013-11-07 LAB — CBC WITH DIFFERENTIAL/PLATELET
BASOS ABS: 0.1 10*3/uL (ref 0.0–0.1)
BASOS PCT: 1 % (ref 0–1)
EOS ABS: 0.1 10*3/uL (ref 0.0–0.7)
Eosinophils Relative: 1 % (ref 0–5)
HEMATOCRIT: 40.9 % (ref 36.0–46.0)
Hemoglobin: 13.1 g/dL (ref 12.0–15.0)
Lymphocytes Relative: 25 % (ref 12–46)
Lymphs Abs: 2.9 10*3/uL (ref 0.7–4.0)
MCH: 32 pg (ref 26.0–34.0)
MCHC: 32 g/dL (ref 30.0–36.0)
MCV: 99.8 fL (ref 78.0–100.0)
MONO ABS: 1.6 10*3/uL — AB (ref 0.1–1.0)
Monocytes Relative: 14 % — ABNORMAL HIGH (ref 3–12)
NEUTROS ABS: 7 10*3/uL (ref 1.7–7.7)
Neutrophils Relative %: 60 % (ref 43–77)
Platelets: 100 10*3/uL — ABNORMAL LOW (ref 150–400)
RBC: 4.1 MIL/uL (ref 3.87–5.11)
RDW: 16.8 % — AB (ref 11.5–15.5)
WBC: 11.6 10*3/uL — ABNORMAL HIGH (ref 4.0–10.5)

## 2013-11-07 LAB — I-STAT CHEM 8, ED
BUN: 32 mg/dL — AB (ref 6–23)
CHLORIDE: 117 meq/L — AB (ref 96–112)
CREATININE: 1.8 mg/dL — AB (ref 0.50–1.10)
Calcium, Ion: 1.14 mmol/L (ref 1.13–1.30)
Glucose, Bld: 30 mg/dL — CL (ref 70–99)
HCT: 44 % (ref 36.0–46.0)
Hemoglobin: 15 g/dL (ref 12.0–15.0)
POTASSIUM: 5.9 meq/L — AB (ref 3.7–5.3)
SODIUM: 140 meq/L (ref 137–147)
TCO2: 12 mmol/L (ref 0–100)

## 2013-11-07 LAB — CBG MONITORING, ED
GLUCOSE-CAPILLARY: 138 mg/dL — AB (ref 70–99)
Glucose-Capillary: 50 mg/dL — ABNORMAL LOW (ref 70–99)

## 2013-11-07 LAB — URINALYSIS, ROUTINE W REFLEX MICROSCOPIC
Glucose, UA: NEGATIVE mg/dL
Ketones, ur: 15 mg/dL — AB
Leukocytes, UA: NEGATIVE
Nitrite: NEGATIVE
Protein, ur: 100 mg/dL — AB
Specific Gravity, Urine: 1.019 (ref 1.005–1.030)
UROBILINOGEN UA: 1 mg/dL (ref 0.0–1.0)
pH: 5.5 (ref 5.0–8.0)

## 2013-11-07 LAB — I-STAT TROPONIN, ED: Troponin i, poc: 0.04 ng/mL (ref 0.00–0.08)

## 2013-11-07 LAB — I-STAT CG4 LACTIC ACID, ED: LACTIC ACID, VENOUS: 8.4 mmol/L — AB (ref 0.5–2.2)

## 2013-11-07 LAB — PROTIME-INR
INR: 1.96 — AB (ref 0.00–1.49)
PROTHROMBIN TIME: 22.3 s — AB (ref 11.6–15.2)

## 2013-11-07 LAB — URINE MICROSCOPIC-ADD ON

## 2013-11-07 LAB — TSH: TSH: 2.17 u[IU]/mL (ref 0.350–4.500)

## 2013-11-07 LAB — CK: Total CK: 156 U/L (ref 7–177)

## 2013-11-07 LAB — PHOSPHORUS: Phosphorus: 4.4 mg/dL (ref 2.3–4.6)

## 2013-11-07 LAB — MRSA PCR SCREENING: MRSA by PCR: NEGATIVE

## 2013-11-07 MED ORDER — SACCHAROMYCES BOULARDII 250 MG PO CAPS
250.0000 mg | ORAL_CAPSULE | Freq: Two times a day (BID) | ORAL | Status: DC
  Administered 2013-11-07 – 2013-11-13 (×13): 250 mg via ORAL
  Filled 2013-11-07 (×15): qty 1

## 2013-11-07 MED ORDER — WARFARIN - PHARMACIST DOSING INPATIENT: Freq: Every day | Status: DC

## 2013-11-07 MED ORDER — DEXTROSE 50 % IV SOLN
INTRAVENOUS | Status: DC | PRN
  Administered 2013-11-07 (×2): 50 mL via INTRAVENOUS

## 2013-11-07 MED ORDER — SODIUM BICARBONATE 8.4 % IV SOLN
INTRAVENOUS | Status: AC
  Administered 2013-11-07: 50 meq via INTRAVENOUS
  Filled 2013-11-07: qty 50

## 2013-11-07 MED ORDER — WARFARIN SODIUM 4 MG PO TABS
4.0000 mg | ORAL_TABLET | Freq: Once | ORAL | Status: AC
  Administered 2013-11-07: 4 mg via ORAL
  Filled 2013-11-07: qty 1

## 2013-11-07 MED ORDER — SODIUM CHLORIDE 0.9 % IV SOLN
1.0000 g | Freq: Once | INTRAVENOUS | Status: AC
  Administered 2013-11-07: 1 g via INTRAVENOUS
  Filled 2013-11-07: qty 10

## 2013-11-07 MED ORDER — ONDANSETRON HCL 4 MG PO TABS: 4.0000 mg | ORAL_TABLET | Freq: Four times a day (QID) | ORAL | Status: DC | PRN

## 2013-11-07 MED ORDER — ACETAMINOPHEN 325 MG PO TABS: 650.0000 mg | ORAL_TABLET | Freq: Four times a day (QID) | ORAL | Status: DC | PRN

## 2013-11-07 MED ORDER — DILTIAZEM HCL 100 MG IV SOLR
5.0000 mg/h | INTRAVENOUS | Status: AC
  Administered 2013-11-07 – 2013-11-08 (×2): 5 mg/h via INTRAVENOUS
  Filled 2013-11-07: qty 100

## 2013-11-07 MED ORDER — PREDNISONE 5 MG PO TABS
5.0000 mg | ORAL_TABLET | Freq: Every morning | ORAL | Status: DC
  Administered 2013-11-07 – 2013-11-08 (×2): 5 mg via ORAL
  Filled 2013-11-07 (×2): qty 1

## 2013-11-07 MED ORDER — ONDANSETRON HCL 4 MG/2ML IJ SOLN: 4.0000 mg | Freq: Four times a day (QID) | INTRAMUSCULAR | Status: DC | PRN

## 2013-11-07 MED ORDER — HYDROXYCHLOROQUINE SULFATE 200 MG PO TABS
200.0000 mg | ORAL_TABLET | Freq: Two times a day (BID) | ORAL | Status: DC
  Administered 2013-11-07 – 2013-11-13 (×13): 200 mg via ORAL
  Filled 2013-11-07 (×15): qty 1

## 2013-11-07 MED ORDER — CETYLPYRIDINIUM CHLORIDE 0.05 % MT LIQD
7.0000 mL | Freq: Two times a day (BID) | OROMUCOSAL | Status: DC
  Administered 2013-11-07 – 2013-11-13 (×12): 7 mL via OROMUCOSAL

## 2013-11-07 MED ORDER — DEXTROSE-NACL 5-0.45 % IV SOLN
INTRAVENOUS | Status: DC
  Administered 2013-11-08 – 2013-11-10 (×3): via INTRAVENOUS

## 2013-11-07 MED ORDER — INSULIN ASPART 100 UNIT/ML ~~LOC~~ SOLN
10.0000 [IU] | Freq: Once | SUBCUTANEOUS | Status: AC
  Administered 2013-11-07: 10 [IU] via INTRAVENOUS
  Filled 2013-11-07: qty 1

## 2013-11-07 MED ORDER — DEXTROSE 50 % IV SOLN
INTRAVENOUS | Status: AC
  Filled 2013-11-07: qty 50

## 2013-11-07 MED ORDER — DEXTROSE-NACL 5-0.9 % IV SOLN
INTRAVENOUS | Status: DC
  Administered 2013-11-07: 09:00:00 via INTRAVENOUS

## 2013-11-07 MED ORDER — SODIUM CHLORIDE 0.9 % IV SOLN
500.0000 mg | INTRAVENOUS | Status: DC
  Administered 2013-11-07: 500 mg via INTRAVENOUS
  Filled 2013-11-07 (×3): qty 10

## 2013-11-07 MED ORDER — SODIUM BICARBONATE 8.4 % IV SOLN
50.0000 meq | Freq: Once | INTRAVENOUS | Status: AC
  Administered 2013-11-07 (×2): 50 meq via INTRAVENOUS

## 2013-11-07 MED ORDER — DILTIAZEM HCL 25 MG/5ML IV SOLN
10.0000 mg | Freq: Once | INTRAVENOUS | Status: AC
  Administered 2013-11-07: 10 mg via INTRAVENOUS

## 2013-11-07 MED ORDER — BUPRENORPHINE 5 MCG/HR TD PTWK
5.0000 ug | MEDICATED_PATCH | TRANSDERMAL | Status: DC
  Administered 2013-11-07: 5 ug via TRANSDERMAL

## 2013-11-07 MED ORDER — DEXTROSE 50 % IV SOLN
1.0000 | Freq: Once | INTRAVENOUS | Status: AC
  Administered 2013-11-07: 50 mL via INTRAVENOUS

## 2013-11-07 MED ORDER — GABAPENTIN 300 MG PO CAPS
300.0000 mg | ORAL_CAPSULE | Freq: Every day | ORAL | Status: DC
  Administered 2013-11-07 – 2013-11-12 (×6): 300 mg via ORAL
  Filled 2013-11-07 (×8): qty 1

## 2013-11-07 MED ORDER — ACETAMINOPHEN 650 MG RE SUPP: 650.0000 mg | Freq: Four times a day (QID) | RECTAL | Status: DC | PRN

## 2013-11-07 MED ORDER — DILTIAZEM HCL 100 MG IV SOLR
5.0000 mg/h | Freq: Once | INTRAVENOUS | Status: AC
  Administered 2013-11-07: 5 mg/h via INTRAVENOUS

## 2013-11-07 MED ORDER — PIPERACILLIN-TAZOBACTAM 3.375 G IVPB
3.3750 g | Freq: Three times a day (TID) | INTRAVENOUS | Status: DC
  Administered 2013-11-07 – 2013-11-09 (×6): 3.375 g via INTRAVENOUS
  Filled 2013-11-07 (×10): qty 50

## 2013-11-07 MED ORDER — ATORVASTATIN CALCIUM 20 MG PO TABS
20.0000 mg | ORAL_TABLET | Freq: Every day | ORAL | Status: DC
  Administered 2013-11-07: 20 mg via ORAL
  Filled 2013-11-07 (×2): qty 1

## 2013-11-07 MED ORDER — PIPERACILLIN-TAZOBACTAM 3.375 G IVPB 30 MIN
3.3750 g | Freq: Once | INTRAVENOUS | Status: AC
  Administered 2013-11-07: 3.375 g via INTRAVENOUS
  Filled 2013-11-07: qty 50

## 2013-11-07 MED ORDER — SODIUM CHLORIDE 0.9 % IV BOLUS (SEPSIS)
500.0000 mL | Freq: Once | INTRAVENOUS | Status: AC
  Administered 2013-11-07: 500 mL via INTRAVENOUS

## 2013-11-07 MED ORDER — VANCOMYCIN HCL IN DEXTROSE 1-5 GM/200ML-% IV SOLN
1000.0000 mg | Freq: Once | INTRAVENOUS | Status: AC
  Administered 2013-11-07: 1000 mg via INTRAVENOUS
  Filled 2013-11-07: qty 200

## 2013-11-07 NOTE — ED Provider Notes (Signed)
CSN: [id number]     Arrival date & time 11/07/13  0720 History   First MD Initiated Contact with Patient 11/07/13 [id number]     Chief Complaint  Patient presents with  . Tachycardia     (Consider location/radiation/quality/duration/timing/severity/associated sxs/prior Treatment) HPI  This is a 73 year old female with a history of coronary artery disease, atrial fibrillation, endocarditis on chronic antibiotics, congestive heart failure who presents with generalized weakness and vomiting. Per the patient's daughter, she has been complaining of generalized fatigue and weakness over the last several days. This morning the daughter found the patient is somnolent and more confused. She also noted that patient had multiple episodes of nonbilious, nonbloody emesis. Patient is notably tachypneic on exam. She denies any shortness of breath, chest pain, abdominal pain, diarrhea. She denies any fevers. The patient is somnolent but arousable. She will answer simple questions and is oriented to person and place.  Of note, patient had lab work obtained on Wednesday by Dr. Terrence Dupont, and this was reportedly "normal."  Level V caveat for acuity of condition.  Past Medical History  Diagnosis Date  . Coronary artery disease   . Atrial fibrillation     Now NSR  . GERD (gastroesophageal reflux disease)   . Hyperlipidemia   . 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 Anemia   . Angina   . Hypertension   . Blood transfusion     "related to bad case of bronchitis once; 1 yr ago given due to GIB" (12/17/2012)  . H/O hiatal hernia   . Lupus     "that's compromised her lungs somewhat" (12/17/2012)  . Insomnia   . Endocarditis   . Prosthetic valve endocarditis   . Enterococcal bacteremia   . Heart murmur   . CHF (congestive heart failure)   . Pneumonia     "once or twice" (12/17/2012)  . Chronic bronchitis     "used to get it once/yr; hasn't had it since ~ 1986" (12/17/2012)  . Shortness of  breath     "all the times sometimes" (12/17/2012)  . On home oxygen therapy     "2L prn; always at night" (12/17/2012)  . Lower GI bleed ~ 01/2012  . Arthritis     "hands" (12/17/2012)  . 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 Givens capsule study  04/09/2011    Procedure: GIVENS CAPSULE STUDY;  Surgeon: Beryle Beams, MD;  Location: [facility];  Service: Endoscopy;  Laterality: N/A;  . Tee without cardioversion  02/06/2012    Procedure: TRANSESOPHAGEAL ECHOCARDIOGRAM (TEE);  Surgeon: Birdie Riddle, MD;  Location[facility] ENDOSCOPY;  Service: Cardiovascular;  Laterality: N/A;  . Esophagogastroduodenoscopy  02/12/2012    Procedure: ESOPHAGOGASTRODUODENOSCOPY (EGD);  Surgeon: Beryle Beams, MD;  Location: [facility] ENDOSCOPY;  Service: Endoscopy;  Laterality: N/A;  . Tee without cardioversion N/A 07/15/2012    Procedure: TRANSESOPHAGEAL ECHOCARDIOGRAM (TEE);  Surgeon: Birdie Riddle, MD;  Location: [facility] ENDOSCOPY;  Service: Cardiovascular;  Laterality: N/A;  . Breast lumpectomy Left 1977    "benign" (12/17/2012)  . Tonsillectomy  1940's  . Coronary artery bypass graft  2006  . 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   . Cardiac catheterization      "probably 2-3" (12/17/2012)  . Coronary angioplasty with stent placement      "several put in over the years" (  12/17/2012)  . Cataract extraction w/ intraocular lens  implant, bilateral Bilateral 11/2000  . Peripherally inserted central catheter insertion Right 12/15/2012    "upper arm" (12/17/2012   Family History  Problem Relation Age of Onset  . Heart disease Mother     CHF  . Mental illness Father     suicide  . Cancer Sister     pancreatic cancer  . COPD Brother    History  Substance Use Topics  . Smoking status: Never Smoker   . Smokeless tobacco: Never Used  . Alcohol Use: No   OB History   Grav Para Term Preterm Abortions TAB SAB  Ect Mult Living                 Review of Systems  Constitutional: Positive for diaphoresis and fatigue. Negative for fever.  Respiratory: Negative for cough, chest tightness and shortness of breath.   Cardiovascular: Negative for chest pain.  Gastrointestinal: Positive for nausea and vomiting. Negative for abdominal pain and diarrhea.  Genitourinary: Negative for dysuria.  Musculoskeletal: Negative for back pain.  Skin: Negative for rash.  Neurological: Negative for headaches.  Psychiatric/Behavioral: Positive for confusion.  All other systems reviewed and are negative.     Allergies  Ceftriaxone and Codeine  Home Medications   Prior to Admission medications   Medication Sig Start Date End Date Taking? Authorizing Provider  amoxicillin (AMOXIL) 500 MG capsule Take 1 capsule (500 mg total) by mouth every 12 (twelve) hours. 09/15/13  Yes Barton Dubois, MD  buprenorphine (BUTRANS) 5 MCG/HR PTWK patch Place 1 patch (5 mcg total) onto the skin once a week. 07/03/13  Yes Tiffany L Reed, DO  famotidine (PEPCID) 20 MG tablet Take 1 tablet (20 mg total) by mouth 2 (two) times daily. 10/27/13  Yes Estill Dooms, MD  furosemide (LASIX) 20 MG tablet Take 20 mg by mouth 2 (two) times daily. 12/20/12  Yes Maryann Mikhail, DO  gabapentin (NEURONTIN) 300 MG capsule Take 1 capsule (300 mg total) by mouth at bedtime. 10/27/13  Yes Estill Dooms, MD  hydroxychloroquine (PLAQUENIL) 200 MG tablet Take 200 mg by mouth 2 (two) times daily.    Yes Historical Provider, MD  IRON PO Take 1 tablet by mouth 2 (two) times daily.   Yes Historical Provider, MD  metolazone (ZAROXOLYN) 2.5 MG tablet Take 2.5 mg by mouth daily.   Yes Historical Provider, MD  mirtazapine (REMERON) 7.5 MG tablet Take 1 tablet (7.5 mg total) by mouth at bedtime. For appetite 08/31/13  Yes Tiffany L Reed, DO  potassium chloride SA (K-DUR,KLOR-CON) 20 MEQ tablet Take 40-60 mEq by mouth 2 (two) times daily. Takes 2 tablets every morning and  takes 3 tablets every evening.   Yes Historical Provider, MD  predniSONE (DELTASONE) 5 MG tablet Take 1 tablet (5 mg total) by mouth every morning. 08/07/12  Yes Elsie Stain, MD  rosuvastatin (CRESTOR) 10 MG tablet Take 10 mg by mouth every morning.    Yes Historical Provider, MD  saccharomyces boulardii (FLORASTOR) 250 MG capsule Take 1 capsule (250 mg total) by mouth 2 (two) times daily. 08/31/13  Yes Tiffany L Reed, DO  warfarin (COUMADIN) 4 MG tablet Take 4 mg by mouth daily.   Yes Historical Provider, MD   BP 129/66  Pulse 123  Temp(Src) 98.5 F (36.9 C) (Rectal)  Resp 27  Wt 150 lb (68.04 kg)  SpO2 79% Physical Exam  Nursing note and vitals reviewed. Constitutional:  Ill-appearing, somnolent  but arousable, tachypneic  HENT:  Head: Normocephalic and atraumatic.  Mucous membranes dry  Eyes: Pupils are equal, round, and reactive to light.  Pupils 3 mm and reactive bilaterally  Neck: Neck supple. No JVD present.  Cardiovascular:  Murmur heard. Tachycardia, irregular rhythm  Pulmonary/Chest: Breath sounds normal. She is in respiratory distress. She has no wheezes.  Tachypnea with increased work of breathing  Abdominal: Soft. Bowel sounds are normal. There is no tenderness. There is no rebound.  Musculoskeletal: She exhibits no edema.  Neurological: She is alert.  Oriented x2, moves all 4 extremities  Skin: Skin is warm.  Diaphoretic  Psychiatric: She has a normal mood and affect.    ED Course  Procedures (including critical care time)  CRITICAL CARE Performed by: Thayer Jew, F   Total critical care time: 75 min  Critical care time was exclusive of separately billable procedures and treating other patients.  Critical care was necessary to treat or prevent imminent or life-threatening deterioration.  Critical care was time spent personally by me on the following activities: development of treatment plan with patient and/or surrogate as well as nursing,  discussions with consultants, evaluation of patient's response to treatment, examination of patient, obtaining history from patient or surrogate, ordering and performing treatments and interventions, ordering and review of laboratory studies, ordering and review of radiographic studies, pulse oximetry and re-evaluation of patient's condition.  Labs Review Labs Reviewed  CBC WITH DIFFERENTIAL - Abnormal; Notable for the following:    WBC 11.6 (*)    RDW 16.8 (*)    Platelets 100 (*)    Monocytes Relative 14 (*)    Monocytes Absolute 1.6 (*)    All other components within normal limits  COMPREHENSIVE METABOLIC PANEL - Abnormal; Notable for the following:    Potassium 6.4 (*)    CO2 10 (*)    Glucose, Bld 32 (*)    BUN 25 (*)    Creatinine, Ser 1.65 (*)    AST 244 (*)    ALT 179 (*)    Alkaline Phosphatase 170 (*)    Total Bilirubin 2.7 (*)    GFR calc non Af Amer 29 (*)    GFR calc Af Amer 34 (*)    Anion gap 26 (*)    All other components within normal limits  PROTIME-INR - Abnormal; Notable for the following:    Prothrombin Time 22.3 (*)    INR 1.96 (*)    All other components within normal limits  URINALYSIS, ROUTINE W REFLEX MICROSCOPIC - Abnormal; Notable for the following:    Hgb urine dipstick TRACE (*)    Bilirubin Urine SMALL (*)    Ketones, ur 15 (*)    Protein, ur 100 (*)    All other components within normal limits  URINE MICROSCOPIC-ADD ON - Abnormal; Notable for the following:    Bacteria, UA FEW (*)    All other components within normal limits  I-STAT CHEM 8, ED - Abnormal; Notable for the following:    Potassium 5.9 (*)    Chloride 117 (*)    BUN 32 (*)    Creatinine, Ser 1.80 (*)    Glucose, Bld 30 (*)    All other components within normal limits  I-STAT CG4 LACTIC ACID, ED - Abnormal; Notable for the following:    Lactic Acid, Venous 8.40 (*)    All other components within normal limits  I-STAT ARTERIAL BLOOD GAS, ED - Abnormal; Notable for the  following:  pH, Arterial 7.098 (*)    pO2, Arterial 32.0 (*)    Bicarbonate 11.7 (*)    Acid-base deficit 17.0 (*)    All other components within normal limits  CBG MONITORING, ED - Abnormal; Notable for the following:    Glucose-Capillary 50 (*)    All other components within normal limits  CULTURE, BLOOD (ROUTINE X 2)  CULTURE, BLOOD (ROUTINE X 2)  URINE CULTURE  I-STAT TROPOININ, ED    Imaging Review No results found.   EKG Interpretation   Date/Time:  Saturday November 07 2013 07:27:43 EDT Ventricular Rate:  148 PR Interval:    QRS Duration: 179 QT Interval:  349 QTC Calculation: 548 R Axis:   -159 Text Interpretation:  Extreme tachycardia with wide complex, no further  rhythm analysis attempted WIde when compared to prior Confirmed by HORTON   MD, COURTNEY ([id number]) on 11/07/2013 8:02:04 AM     Medications  dextrose 50 % solution (50 mLs Intravenous Given 11/07/13 0834)  vancomycin (VANCOCIN) IVPB 1000 mg/200 mL premix (1,000 mg Intravenous New Bag/Given 11/07/13 0921)  dextrose 5 %-0.9 % sodium chloride infusion ( Intravenous New Bag/Given 11/07/13 0850)  calcium gluconate 1 g in sodium chloride 0.9 % 100 mL IVPB (0 g Intravenous Stopped 11/07/13 0822)  sodium chloride 0.9 % bolus 500 mL (0 mLs Intravenous Stopped 11/07/13 0853)  dextrose 50 % solution 50 mL (50 mLs Intravenous Given 11/07/13 0801)  diltiazem (CARDIZEM) 100 mg in dextrose 5 % 100 mL (1 mg/mL) infusion (5 mg/hr Intravenous New Bag/Given 11/07/13 0810)  insulin aspart (novoLOG) injection 10 Units (10 Units Intravenous Given 11/07/13 0827)  piperacillin-tazobactam (ZOSYN) IVPB 3.375 g (3.375 g Intravenous New Bag/Given 11/07/13 0839)  diltiazem (CARDIZEM) injection 10 mg (0 mg Intravenous Stopped 11/07/13 0857)  sodium chloride 0.9 % bolus 500 mL (500 mLs Intravenous New Bag/Given 11/07/13 0845)  sodium bicarbonate injection 50 mEq (50 mEq Intravenous Given 11/07/13 0844)    MDM   Final diagnoses:  Hyperkalemia   AKI (acute kidney injury)  Lactic acid acidosis  Respiratory distress  Atrial fibrillation with RVR  Transaminitis  Hypoxia   Patient presents with generalized weakness over the last several days. On initial exam she is in respiratory distress with tachypnea and increased work of breathing. Initial O2 sats 90%. Pulse 155. EKG shows a wide complex tachycardia. History of atrial fibrillation but no intraventricular conduction delay. Would be concerned for hyperkalemia. Basic labwork obtained including sepsis protocol. Patient was given IV calcium gluconate. Previous echo showed an EF of 50-55%. She was given fluids. I-STAT lab work obtained and shows a potassium of 5.9, glucose of 30, creatinine of 1.8, and lactate of 8.4. Troponin is negative. Patient was given 3 amps of D50, 10 units of insulin. She was also given 500 cc of normal saline and impaired antibiotics for sepsis. Noted the patient dropped her O2 sats into the 60s. On a nonrebreather with a good waveform, patient maintained O2 sats in the mid-70s. Discussed with patient and her family CODE STATUS. Patient has an advanced directive per her daughters that states that she does not want to be placed on a breathing tube or ventilator if it will not prolong her life in a meaningful way. Patient is still alert and oriented. She continues to endorse that she would not want a breathing tube or chest compressions at this time. She is DO NOT RESUSCITATE/DO NOT INTUBATE. Respiratory consulted to place patient on BiPAP. ABG obtained with PO2 of 35 and pH  of 7.06.   Patient was given 1 amp of bicarbonate. She appears to be tolerating the BiPAP well; however, she continues to have a marginal O2 sats by pulse ox. While she is anticoagulated, it appears she has a shunt. At this time and do not feel she is stable for CT scan. Patient's monitor her improved with calcium gluconate. We'll place on diltiazem drip for atrial fibrillation with RVR.  Hospitalist was  consulted for admission to the step down unit.  Cardiology also consulted given that she is followed closely by Dr. Terrence Dupont. At this time I'm unsure of the exact cause of the patient's decline.  No obvious source of infection at this time.   Merryl Hacker, MD 11/07/13 [phone]

## 2013-11-07 NOTE — ED Notes (Signed)
PT started on bipap, tolerating well at this time

## 2013-11-07 NOTE — ED Notes (Signed)
Pt in from home with family, reporting generalized weakness over the last few days, vomiting during the night, acute change in condition this morning, pt was found to be pale with rapid breathing, c/o shortness of breath, diaphoretic upon arrival, pt speaking in short phrases, MD to bedside

## 2013-11-07 NOTE — ED Notes (Signed)
Pt remains on bi-pap, arouses to voice, will answer questions appropriately, tolerating well.

## 2013-11-07 NOTE — ED Notes (Signed)
MD and family aware of O2 level, MD at bedside establishing plan of care based on patient wishes. Pt daughter is co-power of attorney and is at bedside.

## 2013-11-07 NOTE — ED Notes (Addendum)
Family given dentures, upper and lower

## 2013-11-07 NOTE — Consult Note (Signed)
[name]     Reason for Consult: ? infection    Referring Physician: Dr. Ree Kida  Active Problems:   Respiratory distress   Severe sepsis   . atorvastatin  20 mg Oral q1800  . buprenorphine  5 mcg Transdermal Weekly  . DAPTOmycin (CUBICIN)  IV  500 mg Intravenous Q48H  . gabapentin  300 mg Oral QHS  . hydroxychloroquine  200 mg Oral BID  . piperacillin-tazobactam (ZOSYN)  IV  3.375 g Intravenous 3 times per day  . predniSONE  5 mg Oral q morning - 10a  . saccharomyces boulardii  250 mg Oral BID  . warfarin  4 mg Oral ONCE-1800  . Warfarin - Pharmacist Dosing Inpatient   Does not apply q1800    Recommendations: Continue with daptomycin and zosyn for now  No diarrhea so no indication for stool studies  Assessment: She developed somewhat acute SOB, vomited once has afib with RVR.  On presentation she was signficantly hypoxic with lactic acidosis and metabolic acidosis but no signs of infection, no diarrhea at home or here.  She has a history of Afib.  She also has a history of Enterococcal prosthetic and native valve endocarditis and not a surgical candidate.  DDx includes lactic acidosis from hypoxia with afib in the setting of pulmonary fibrosis and resultant decreased bicarbonate vs infectious, though she has not been hypotensive, no fever, and mild increase in WBC and no signs of infection at home.     Antibiotics: Has received vancomycin   HPI: [name] is a 73 y.o. 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  Had one episode of emesis yesterday according to the patient, no diarrhea and some congestion.  She noticed some mild sob, no fevers.  No palpatations.  Some confustion and brought in this am for  evaluation.  Noted lactic acidosis, metabolic acidosis, hypoxia and pH of 7.098 on ABG.  Started on bipap and diltiazem and blood cultures sent.  ABG now with normal pH.     Review of Systems: Pertinent items are noted in HPI.  Past Medical History  Diagnosis Date  . Coronary artery disease   . Atrial fibrillation     Now NSR  . GERD (gastroesophageal reflux disease)   . Hyperlipidemia   . 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 Anemia   . Angina   . Hypertension   . Blood transfusion     "related to bad case of bronchitis once; 1 yr ago given due to GIB" (12/17/2012)  . H/O hiatal hernia   . Lupus     "that's compromised her lungs somewhat" (12/17/2012)  . Insomnia   . Endocarditis   . Prosthetic valve endocarditis   . Enterococcal bacteremia   . Heart murmur   . CHF (congestive heart failure)   . Pneumonia     "once or twice" (12/17/2012)  . Chronic bronchitis     "used to get it once/yr; hasn't had it since ~ 1986" (12/17/2012)  . Shortness of breath     "all the times sometimes" (12/17/2012)  . On home oxygen therapy     "2L prn; always at night" (12/17/2012)  . Lower GI bleed ~ 01/2012  .  Arthritis     "hands" (12/17/2012)  . 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 Right bundle branch block 11/07/2013    History  Substance Use Topics  . Smoking status: Never Smoker   . Smokeless tobacco: Never Used  . Alcohol Use: No    Family History  Problem Relation Age of Onset  . Heart disease Mother     CHF  . Mental illness Father     suicide  . Cancer Sister     pancreatic cancer  . COPD Brother    Allergies  Allergen Reactions  . Ceftriaxone     RASH BUT TOLERATED AMPICILLIN AND IMIPENEM WITHOUT PROBLEMS  . Codeine Nausea Only    OBJECTIVE: Blood pressure 117/65, pulse 58, temperature 99.3 F (37.4 C), temperature source Axillary, resp. rate 27, height 5\' 7"  (1.702 m), weight 158 lb 11.7 oz (72 kg), SpO2  95.00%. General: awake, alert, mild respiratory distress Skin: no rashes Lungs: CTA B Cor: irr irr, tachy Abdomen: soft, nt, nd Ext: no edema  Microbiology: No results found for this or any previous visit (from the past 240 hour(s)).  [name], [place] [facility] Infectious [facility] [place] [email or web address] [phone] pager  [phone] cell 11/07/2013, 2:20 PM

## 2013-11-07 NOTE — ED Notes (Signed)
Cardiology at bedside.

## 2013-11-07 NOTE — H&P (Signed)
[facility] History and Physical  SYNA GAD NWG:[id number] DOB: 01/27/1941 DOA: 11/07/2013  Referring physician:  PCP: Hollace Kinnier, DO  Specialists: Dr. Terrence Dupont, cardiologist  Chief Complaint: Generalized weakness and vomiting  HPI: [name] is a 73 y.o. 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 Patient resides with her daughter who is at bedside. Patient's daughter she complaining of weakness and fatigue for the past several days and has had a poor appetite. Daughter stated that she has been somewhat confused and very sleepy. She had 4 episodes of vomiting, nonbilious and nonbloody. Patient's daughter stated she had labs done at Dr. Zenia Resides office this past Wednesday and were apparently were normal. Patient required BiPAP while in the emergency room do to respiratory acidosis and tachypnea. She is able to answer yes and no questions. Patient denies any chest pain, abdominal pain, further nausea and vomiting, headache, dizziness.  Per daughter, no recent travel the patient does not have any contact with ill persons.  While in the emergency department, patient was noted to have atrial fibrillation with RVR, and started on diltiazem drip  Review of Systems:  Constitutional: Denies fever, chills, diaphoresis, appetite change and fatigue.  HEENT: Denies photophobia, eye pain, redness, hearing loss, ear pain, congestion, sore throat, rhinorrhea, sneezing, mouth sores, trouble swallowing, neck pain, neck stiffness and tinnitus.   Respiratory: Complains of SOB. Cardiovascular: Denies chest pain, palpitations and leg swelling.  Gastrointestinal: Had nausea and vomiting yesterday, but resolved.  Genitourinary: Denies dysuria, urgency, frequency, hematuria,  flank pain and difficulty urinating.  Musculoskeletal: Denies myalgias, back pain, joint swelling, arthralgias and gait problem.  Skin: Denies pallor, rash and wound.  Neurological: Denies dizziness, seizures, syncope, weakness, light-headedness, numbness and headaches.  Hematological: Denies adenopathy. Easy bruising, personal or family bleeding history  Psychiatric/Behavioral: Denies suicidal ideation, mood changes, confusion, nervousness, sleep disturbance and agitation  Past Medical History  Diagnosis Date  . Coronary artery disease   . Atrial fibrillation     Now NSR  . GERD (gastroesophageal reflux disease)   . Hyperlipidemia   . 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 Anemia   . Angina   . Hypertension   . Blood transfusion     "related to bad case of bronchitis once; 1 yr ago given due to GIB" (12/17/2012)  . H/O hiatal hernia   . Lupus     "that's compromised her lungs somewhat" (12/17/2012)  . Insomnia   . Endocarditis   . Prosthetic valve endocarditis   . Enterococcal bacteremia   . Heart murmur   . CHF (congestive heart failure)   . Pneumonia     "once or twice" (12/17/2012)  . Chronic bronchitis     "used to get it once/yr; hasn't had it since ~ 1986" (12/17/2012)  . Shortness of breath     "all the times sometimes" (12/17/2012)  . On home oxygen therapy     "2L prn; always at night" (12/17/2012)  . Lower GI bleed ~ 01/2012  . Arthritis     "hands" (12/17/2012)  . 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 Givens capsule study  04/09/2011    Procedure: GIVENS CAPSULE STUDY;  Surgeon: Beryle Beams, MD;  Location: [facility] ENDOSCOPY;  Service: Endoscopy;  Laterality: N/A;  . Tee without cardioversion  02/06/2012    Procedure: TRANSESOPHAGEAL ECHOCARDIOGRAM (TEE);  Surgeon: Birdie Riddle, MD;  Location: [facility] ENDOSCOPY;  Service: Cardiovascular;  Laterality: N/A;  .  Esophagogastroduodenoscopy  02/12/2012    Procedure: ESOPHAGOGASTRODUODENOSCOPY (EGD);  Surgeon: Beryle Beams, MD;  Location: [facility] ENDOSCOPY;  Service: Endoscopy;  Laterality: N/A;  . Tee without cardioversion N/A 07/15/2012    Procedure: TRANSESOPHAGEAL ECHOCARDIOGRAM (TEE);  Surgeon: Birdie Riddle, MD;  Location: [facility] ENDOSCOPY;  Service: Cardiovascular;  Laterality: N/A;  . Breast lumpectomy Left 1977    "benign" (12/17/2012)  . Tonsillectomy  1940's  . Coronary artery bypass graft  2006  . 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   . Cardiac catheterization      "probably 2-3" (12/17/2012)  . Coronary angioplasty with stent placement      "several put in over the years" (12/17/2012)  . Cataract extraction w/ intraocular lens  implant, bilateral Bilateral 11/2000  . Peripherally inserted central catheter insertion Right 12/15/2012    "upper arm" (12/17/2012   Social History:  reports that she has never smoked. She has never used smokeless tobacco. She reports that she does not drink alcohol or use illicit drugs.   Allergies  Allergen Reactions  . Ceftriaxone     RASH BUT TOLERATED AMPICILLIN AND IMIPENEM WITHOUT PROBLEMS  . Codeine Nausea Only    Family History  Problem Relation Age of Onset  . Heart disease Mother     CHF  . Mental illness Father     suicide  . Cancer Sister     pancreatic cancer  . COPD Brother     Prior to Admission medications   Medication Sig Start Date End Date Taking? Authorizing Provider  amoxicillin (AMOXIL) 500 MG capsule Take 1 capsule (500 mg total) by mouth every 12 (twelve) hours. 09/15/13  Yes Barton Dubois, MD  buprenorphine (BUTRANS) 5 MCG/HR PTWK patch Place 1 patch (5 mcg total) onto the skin once a week. 07/03/13  Yes Tiffany L Reed, DO  famotidine (PEPCID) 20 MG tablet Take 1 tablet (20 mg total) by mouth 2 (two) times daily. 10/27/13  Yes Estill Dooms, MD  furosemide (LASIX) 20 MG tablet Take 20 mg by mouth 2  (two) times daily. 12/20/12  Yes Beckett Maden, DO  gabapentin (NEURONTIN) 300 MG capsule Take 1 capsule (300 mg total) by mouth at bedtime. 10/27/13  Yes Estill Dooms, MD  hydroxychloroquine (PLAQUENIL) 200 MG tablet Take 200 mg by mouth 2 (two) times daily.    Yes Historical Provider, MD  IRON PO Take 1 tablet by mouth 2 (two) times daily.   Yes Historical Provider, MD  metolazone (ZAROXOLYN) 2.5 MG tablet Take 2.5 mg by mouth daily.   Yes Historical Provider, MD  mirtazapine (REMERON) 7.5 MG tablet Take 1 tablet (7.5 mg total) by mouth at bedtime. For appetite 08/31/13  Yes Tiffany L Reed, DO  potassium chloride SA (K-DUR,KLOR-CON) 20 MEQ tablet Take 40-60 mEq by mouth 2 (two) times daily. Takes 2 tablets every morning and takes 3 tablets every evening.   Yes Historical Provider, MD  predniSONE (DELTASONE) 5 MG tablet Take 1 tablet (5 mg total) by mouth every morning. 08/07/12  Yes Elsie Stain, MD  rosuvastatin (CRESTOR) 10 MG tablet Take 10 mg by mouth every morning.    Yes Historical Provider, MD  saccharomyces boulardii (FLORASTOR) 250 MG capsule Take  1 capsule (250 mg total) by mouth 2 (two) times daily. 08/31/13  Yes Tiffany L Reed, DO  warfarin (COUMADIN) 4 MG tablet Take 4 mg by mouth daily.   Yes Historical Provider, MD   Physical Exam: Filed Vitals:   11/07/13 0915  BP: 149/126  Pulse: 125  Temp:   Resp: 20     General: Well developed, well nourished, comfortable on BiPap, appears stated age  HEENT: NCAT, PERRLA, EOMI, Anicteic Sclera, mucous membranes dry, on bipap  Neck: Supple, no JVD, no masses  Cardiovascular: S1 S2 auscultated,+3/6 SEM, Irregular, tachycardic  Respiratory: Clear to auscultation bilaterally with equal chest rise  Abdomen: Soft, nontender, nondistended, + bowel sounds  Extremities: dry without cyanosis clubbing or edema, feet cool to touch and cyanotic- but good pulses  Neuro: AAOx3, cranial nerves grossly intact.  Strength equal and  bilateral  Skin: Without rashes exudates or nodules  Labs on Admission:  Basic Metabolic Panel:  Recent Labs Lab 11/07/13 0735 11/07/13 0752  NA 142 140  K 6.4* 5.9*  CL 106 117*  CO2 10*  --   GLUCOSE 32* 30*  BUN 25* 32*  CREATININE 1.65* 1.80*  CALCIUM 10.1  --    Liver Function Tests:  Recent Labs Lab 11/07/13 0735  AST 244*  ALT 179*  ALKPHOS 170*  BILITOT 2.7*  PROT 7.8  ALBUMIN 3.6   No results found for this basename: LIPASE, AMYLASE,  in the last 168 hours No results found for this basename: AMMONIA,  in the last 168 hours CBC:  Recent Labs Lab 11/07/13 0735 11/07/13 0752  WBC 11.6*  --   NEUTROABS 7.0  --   HGB 13.1 15.0  HCT 40.9 44.0  MCV 99.8  --   PLT 100*  --    Cardiac Enzymes: No results found for this basename: CKTOTAL, CKMB, CKMBINDEX, TROPONINI,  in the last 168 hours  BNP (last 3 results)  Recent Labs  09/12/13 1122  PROBNP 16828.0*   CBG:  Recent Labs Lab 11/07/13 0828  GLUCAP 50*    Radiological Exams on Admission: No results found.  EKG: Independently reviewed. Wide complex tachycardia, rate 148  Assessment/Plan  Severe Sepsis with lactic acidosis -Patient will be admitted to step down unit -Secondary to unknown source at this time. Patient does have a slight leukocytosis, and tachycardic as well as taychycardic -Chest x-ray and blood cultures pending, UA negative -Will obtain C. difficile and GI pathogen panel -Will place patient on broad-spectrum antibiotics with daptomyocin pharmacy to dose and follow -Patient has had recent history of repeat endocarditis as well as C. difficile colitis -Patient currently has no diarrhea or complaints of abdominal pain -Spoke with Dr. Linus Salmons, infectious disease, who will see the patient. -Could possibly be secondary to viral gastroenteritis versus recurrent C. difficile versus recurrent endocarditis -Repeat a lactic acid   Acute hypoxic respiratory failure -Continue patient  on BiPAP -Patient does not wish to be intubated -Possibly secondary to patient's A. fib RVR versus sepsis  Atrial fibrillation with RVR -Possibly secondary to sepsis -Continue diltiazem drip -Cardiology consulted and appreciated -Will obtain TSH, magnesium, phosphate level -Continue Coumadin per pharmacy  Acute kidney injury on CAD stage III -Creatinine currently 1.8, baseline usually less than 1 -Likely secondary to sepsis -Will place patient on IV fluids and continue to monitor  Elevated transaminases -Likely secondary to sepsis -Will obtain abdominal ultrasound -Continue to monitor LFTs  Nausea and vomiting -Likely related to sepsis versus gastroenteritis -Will continue antiemetic  History of lupus -Continue steroids and plaquenil  Hyperlipidemia -Continue statin  Chronic diastolic heart failure -Will hold diuretics at this time due to patient's sepsis -Currently compensated -Will continue to monitor her daily weights and strict intake and output  Recurrent enterococcal endocarditis -Will hold ampicillin -Will place patient on daptomycin and Zosyn -Blood cultures pending -Infectious disease consulted and appreciated  DVT prophylaxis: Coumadin  Code Status: DNR/DNI  Condition: Critical  Family Communication: Daughters at bedside.  Admission, patients condition and plan of care including tests being ordered have been discussed with the patient and daughters who indicate understanding and agree with the plan and Code Status.  Disposition Plan: Admitted  Time spent: 65 minutes  Sydney Hasten D.O. [facility] Pager [phone]  If 7PM-7AM, please contact night-coverage [email or web address] Password [facility] 11/07/2013, 9:27 AM

## 2013-11-07 NOTE — ED Notes (Signed)
Critical labs received from lab CO2: 10 Glucose: 32 Dr. Dina Rich informed, and acknowledged via readback

## 2013-11-07 NOTE — Consult Note (Signed)
Referring Physician: Charolette Forward, MD  [name] is an 73 y.o. female.                       Reason for consult: Wide complex tachycardia and hyperkalemia, metabolic and respiratory acidosis.  HPI: 73 year old female with PMH of Lupus, chronic endocarditis on Amoxil, HTN, and anorexia presented with generalized weakness and vomiting. Patient appeared confused this AM. No fever, abdominal pain or chest pain. Recent blood work was unremarkable per family. Currently patient is on Bipap. Her ABG showed severe metabolic and respiratory acidosis and hyperkalemia with severe hypoglycemia.    Past Medical History  Diagnosis Date  . Coronary artery disease   . Atrial fibrillation     Now NSR  . GERD (gastroesophageal reflux disease)   . Hyperlipidemia   . 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 Anemia   . Angina   . Hypertension   . Blood transfusion     "related to bad case of bronchitis once; 1 yr ago given due to GIB" (12/17/2012)  . H/O hiatal hernia   . Lupus     "that's compromised her lungs somewhat" (12/17/2012)  . Insomnia   . Endocarditis   . Prosthetic valve endocarditis   . Enterococcal bacteremia   . Heart murmur   . CHF (congestive heart failure)   . Pneumonia     "once or twice" (12/17/2012)  . Chronic bronchitis     "used to get it once/yr; hasn't had it since ~ 1986" (12/17/2012)  . Shortness of breath     "all the times sometimes" (12/17/2012)  . On home oxygen therapy     "2L prn; always at night" (12/17/2012)  . Lower GI bleed ~ 01/2012  . Arthritis     "hands" (12/17/2012)  . 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 Right bundle branch block 11/07/2013      Past Surgical History  Procedure Laterality Date  . Givens capsule study  04/09/2011    Procedure: GIVENS CAPSULE STUDY;  Surgeon: Beryle Beams, MD;  Location: [facility];  Service: Endoscopy;  Laterality: N/A;  . Tee without cardioversion  02/06/2012     Procedure: TRANSESOPHAGEAL ECHOCARDIOGRAM (TEE);  Surgeon: Birdie Riddle, MD;  Location: [facility] ENDOSCOPY;  Service: Cardiovascular;  Laterality: N/A;  . Esophagogastroduodenoscopy  02/12/2012    Procedure: ESOPHAGOGASTRODUODENOSCOPY (EGD);  Surgeon: Beryle Beams, MD;  Location: [facility] ENDOSCOPY;  Service: Endoscopy;  Laterality: N/A;  . Tee without cardioversion N/A 07/15/2012    Procedure: TRANSESOPHAGEAL ECHOCARDIOGRAM (TEE);  Surgeon: Birdie Riddle, MD;  Location: [facility] ENDOSCOPY;  Service: Cardiovascular;  Laterality: N/A;  . Breast lumpectomy Left 1977    "benign" (12/17/2012)  . Tonsillectomy  1940's  . Coronary artery bypass graft  2006  . 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   . Cardiac catheterization      "probably 2-3" (12/17/2012)  . Coronary angioplasty with stent placement      "several put in over the years" (12/17/2012)  . Cataract extraction w/ intraocular lens  implant, bilateral Bilateral 11/2000  . Peripherally inserted central catheter insertion Right 12/15/2012    "upper arm" (12/17/2012    Family History  Problem Relation Age of Onset  . Heart disease Mother     CHF  . Mental illness Father     suicide  . Cancer  Sister     pancreatic cancer  . COPD Brother    Social History:  reports that she has never smoked. She has never used smokeless tobacco. She reports that she does not drink alcohol or use illicit drugs.  Allergies:  Allergies  Allergen Reactions  . Ceftriaxone     RASH BUT TOLERATED AMPICILLIN AND IMIPENEM WITHOUT PROBLEMS  . Codeine Nausea Only     (Not in a hospital admission)  Results for orders placed during the hospital encounter of 11/07/13 (from the past 48 hour(s))  CBC WITH DIFFERENTIAL     Status: Abnormal   Collection Time    11/07/13  7:35 AM      Result Value Ref Range   WBC 11.6 (*) 4.0 - 10.5 K/uL   RBC 4.10  3.87 - 5.11 MIL/uL   Hemoglobin 13.1  12.0 - 15.0 g/dL   HCT 40.9  36.0 - 46.0 %   MCV  99.8  78.0 - 100.0 fL   MCH 32.0  26.0 - 34.0 pg   MCHC 32.0  30.0 - 36.0 g/dL   RDW 16.8 (*) 11.5 - 15.5 %   Platelets 100 (*) 150 - 400 K/uL   Comment: PLATELET COUNT CONFIRMED BY SMEAR   Neutrophils Relative % 60  43 - 77 %   Neutro Abs 7.0  1.7 - 7.7 K/uL   Lymphocytes Relative 25  12 - 46 %   Lymphs Abs 2.9  0.7 - 4.0 K/uL   Monocytes Relative 14 (*) 3 - 12 %   Monocytes Absolute 1.6 (*) 0.1 - 1.0 K/uL   Eosinophils Relative 1  0 - 5 %   Eosinophils Absolute 0.1  0.0 - 0.7 K/uL   Basophils Relative 1  0 - 1 %   Basophils Absolute 0.1  0.0 - 0.1 K/uL  COMPREHENSIVE METABOLIC PANEL     Status: Abnormal   Collection Time    11/07/13  7:35 AM      Result Value Ref Range   Sodium 142  137 - 147 mEq/L   Potassium 6.4 (*) 3.7 - 5.3 mEq/L   Chloride 106  96 - 112 mEq/L   CO2 10 (*) 19 - 32 mEq/L   Comment: CRITICAL RESULT CALLED TO, READ BACK BY AND VERIFIED WITH:     SNYDERSRN [id number] MCCAULEG   Glucose, Bld 32 (*) 70 - 99 mg/dL   Comment: CRITICAL RESULT CALLED TO, READ BACK BY AND VERIFIED WITH:     SNYDERSRN [id number] M[place]AU[place]G   BUN 25 (*) 6 - 23 mg/dL   Creatinine, Ser 1.65 (*) 0.50 - 1.10 mg/dL   Calcium 10.1  8.4 - 10.5 mg/dL   Total Protein 7.8  6.0 - 8.3 g/dL   Albumin 3.6  3.5 - 5.2 g/dL   AST 244 (*) 0 - 37 U/L   Comment: HEMOLYSIS AT THIS LEVEL MAY AFFECT RESULT   ALT 179 (*) 0 - 35 U/L   Alkaline Phosphatase 170 (*) 39 - 117 U/L   Total Bilirubin 2.7 (*) 0.3 - 1.2 mg/dL   GFR calc non Af Amer 29 (*) >90 mL/min   GFR calc Af Amer 34 (*) >90 mL/min   Comment: (NOTE)     The eGFR has been calculated using the CKD EPI equation.     This calculation has not been validated in all clinical situations.     eGFR's persistently <90 mL/min signify possible Chronic Kidney     Disease.   Anion  gap 26 (*) 5 - 15  PROTIME-INR     Status: Abnormal   Collection Time    11/07/13  7:35 AM      Result Value Ref Range   Prothrombin Time 22.3 (*) 11.6 - 15.2 seconds    INR 1.96 (*) 0.00 - 1.49  I-STAT TROPOININ, ED     Status: None   Collection Time    11/07/13  7:50 AM      Result Value Ref Range   Troponin i, poc 0.04  0.00 - 0.08 ng/mL   Comment 3            Comment: Due to the release kinetics of cTnI,     a negative result within the first hours     of the onset of symptoms does not rule out     myocardial infarction with certainty.     If myocardial infarction is still suspected,     repeat the test at appropriate intervals.  I-STAT CHEM 8, ED     Status: Abnormal   Collection Time    11/07/13  7:52 AM      Result Value Ref Range   Sodium 140  137 - 147 mEq/L   Potassium 5.9 (*) 3.7 - 5.3 mEq/L   Chloride 117 (*) 96 - 112 mEq/L   BUN 32 (*) 6 - 23 mg/dL   Creatinine, Ser 1.80 (*) 0.50 - 1.10 mg/dL   Glucose, Bld 30 (*) 70 - 99 mg/dL   Calcium, Ion 1.14  1.13 - 1.30 mmol/L   TCO2 12  0 - 100 mmol/L   Hemoglobin 15.0  12.0 - 15.0 g/dL   HCT 44.0  36.0 - 46.0 %   Comment NOTIFIED PHYSICIAN    I-STAT CG4 LACTIC ACID, ED     Status: Abnormal   Collection Time    11/07/13  7:52 AM      Result Value Ref Range   Lactic Acid, Venous 8.40 (*) 0.5 - 2.2 mmol/L  URINALYSIS, ROUTINE W REFLEX MICROSCOPIC     Status: Abnormal   Collection Time    11/07/13  7:56 AM      Result Value Ref Range   Color, Urine YELLOW  YELLOW   APPearance CLEAR  CLEAR   Specific Gravity, Urine 1.019  1.005 - 1.030   pH 5.5  5.0 - 8.0   Glucose, UA NEGATIVE  NEGATIVE mg/dL   Hgb urine dipstick TRACE (*) NEGATIVE   Bilirubin Urine SMALL (*) NEGATIVE   Ketones, ur 15 (*) NEGATIVE mg/dL   Protein, ur 100 (*) NEGATIVE mg/dL   Urobilinogen, UA 1.0  0.0 - 1.0 mg/dL   Nitrite NEGATIVE  NEGATIVE   Leukocytes, UA NEGATIVE  NEGATIVE  URINE MICROSCOPIC-ADD ON     Status: Abnormal   Collection Time    11/07/13  7:56 AM      Result Value Ref Range   Squamous Epithelial / LPF RARE  RARE   WBC, UA 0-2  <3 WBC/hpf   RBC / HPF 0-2  <3 RBC/hpf   Bacteria, UA FEW (*) RARE   CBG MONITORING, ED     Status: Abnormal   Collection Time    11/07/13  8:28 AM      Result Value Ref Range   Glucose-Capillary 50 (*) 70 - 99 mg/dL   Comment 1 Documented in Chart     Comment 2 Notify RN    I-STAT ARTERIAL BLOOD GAS, ED     Status: Abnormal  Collection Time    11/07/13  8:36 AM      Result Value Ref Range   pH, Arterial 7.098 (*) 7.350 - 7.450   pCO2 arterial 37.8  35.0 - 45.0 mmHg   pO2, Arterial 32.0 (*) 80.0 - 100.0 mmHg   Bicarbonate 11.7 (*) 20.0 - 24.0 mEq/L   TCO2 13  0 - 100 mmol/L   O2 Saturation 43.0     Acid-base deficit 17.0 (*) 0.0 - 2.0 mmol/L   Patient temperature 98.6 F     Collection site RADIAL, ALLEN'S TEST ACCEPTABLE     Drawn by Operator     Sample type ARTERIAL     Comment MD NOTIFIED, REPEAT TEST    CBG MONITORING, ED     Status: Abnormal   Collection Time    11/07/13  9:33 AM      Result Value Ref Range   Glucose-Capillary 138 (*) 70 - 99 mg/dL   Comment 1 Documented in Chart     Comment 2 Notify RN     Dg Chest Portable 1 View  11/07/2013   CLINICAL DATA:  Generalized weakness  EXAM: PORTABLE CHEST - 1 VIEW  COMPARISON:  09/12/2013  FINDINGS: Stable changes from previous cardiac surgery. Cardiac silhouette is enlarged. No mediastinal or hilar masses.  Mild stable interstitial thickening. Lungs are otherwise clear. No pleural effusion or pneumothorax.  Bony thorax is demineralized but grossly intact.  IMPRESSION: No acute cardiopulmonary disease.   Electronically Signed   By: Lajean Manes M.D.   On: 11/07/2013 09:29    Review Of Systems Constitutional: Negative for fever.  Eyes: Negative for blurred vision and double vision.  Respiratory: Negative for cough, hemoptysis, sputum production but positive for exertional shortness of breath.  Cardiovascular: Positive for leg swelling. Negative for chest pain, palpitations and orthopnea.  Gastrointestinal: Positive for nausea, vomiting and abdominal pain.  Genitourinary: Negative for  dysuria.  Neurological: Negative for dizziness and headaches.   Blood pressure 142/108, pulse 114, temperature 98.5 F (36.9 C), temperature source Rectal, resp. rate 28, weight 68.04 kg (150 lb), SpO2 100.00%. Constitutional: She appears well-developed and averagely-nourished.  HENT: Normocephalic and atraumatic. No oropharyngeal exudate. Eyes: Conjunctivae normal are normal. Pupils are equal, round, and reactive to light. No scleral icterus.  Neck: Normal range of motion. Neck supple. No JVD No tracheal deviation present. No thyromegaly present.  Cardiovascular: Irregularly irregular and tachycardic. S1-S2 soft. soft systolic and diastolic murmur noted no S3 gallop  Respiratory: Increased effort. Breath sounds normal. Moderate to severe respiratory distress. She has no wheezes. She has no rales.  GI: Soft. Bowel sounds are normal. She exhibits no distension. There is no tenderness. There is no rebound.  Musculoskeletal: No clubbing or cyanosis. Trace edema lower extremity. Neurological: She is alert and oriented to person. Moves all 4 extremities in bed   Assessment/Plan Wide complex tachycardia with RBBB. H/O atrial fibrillation Severe hyperkalemia Severe hypoglycemia R/O sepsis Chronic left heart systolic failure  Chronic endocarditis with valvular hear disease  Weakness  Chronic renal insufficiency  Recheck ABG/Continue ventilation support with bipap-Patient is DNR. IV antibiotics Zosyn and Vancomycin started by Hospitalist.   Birdie Riddle, MD  11/07/2013, 9:36 AM

## 2013-11-07 NOTE — Progress Notes (Signed)
ANTIBIOTIC CONSULT NOTE - INITIAL  Pharmacy Consult for Zosyn, Daptomycin, Warfarin Indication: rule out sepsis, Afib  Allergies  Allergen Reactions  . Ceftriaxone     RASH BUT TOLERATED AMPICILLIN AND IMIPENEM WITHOUT PROBLEMS  . Codeine Nausea Only    Patient Measurements: Height: 5\' 7"  (170.2 cm) Weight: 158 lb 11.7 oz (72 kg) IBW/kg (Calculated) : 61.6   Vital Signs: Temp: 99.3 F (37.4 C) (08/15 1225) Temp src: Axillary (08/15 1225) BP: 117/65 mmHg (08/15 1225) Pulse Rate: 58 (08/15 1225) Intake/Output from previous day:   Intake/Output from this shift:    Labs:  Recent Labs  11/07/13 0735 11/07/13 0752  WBC 11.6*  --   HGB 13.1 15.0  PLT 100*  --   CREATININE 1.65* 1.80*   Estimated Creatinine Clearance: 26.3 ml/min (by C-G formula based on Cr of 1.8). No results found for this basename: VANCOTROUGH, VANCOPEAK, VANCORANDOM, GENTTROUGH, GENTPEAK, GENTRANDOM, TOBRATROUGH, TOBRAPEAK, TOBRARND, AMIKACINPEAK, AMIKACINTROU, AMIKACIN,  in the last 72 hours   Microbiology: No results found for this or any previous visit (from the past 720 hour(s)).  Medical History: Past Medical History  Diagnosis Date  . Coronary artery disease   . Atrial fibrillation     Now NSR  . GERD (gastroesophageal reflux disease)   . Hyperlipidemia   . 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 Anemia   . Angina   . Hypertension   . Blood transfusion     "related to bad case of bronchitis once; 1 yr ago given due to GIB" (12/17/2012)  . H/O hiatal hernia   . Lupus     "that's compromised her lungs somewhat" (12/17/2012)  . Insomnia   . Endocarditis   . Prosthetic valve endocarditis   . Enterococcal bacteremia   . Heart murmur   . CHF (congestive heart failure)   . Pneumonia     "once or twice" (12/17/2012)  . Chronic bronchitis     "used to get it once/yr; hasn't had it since ~ 1986" (12/17/2012)  . Shortness of breath     "all the times sometimes"  (12/17/2012)  . On home oxygen therapy     "2L prn; always at night" (12/17/2012)  . Lower GI bleed ~ 01/2012  . Arthritis     "hands" (12/17/2012)  . 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 Right bundle branch block 11/07/2013    Medications:  Scheduled:  . atorvastatin  20 mg Oral q1800  . buprenorphine  5 mcg Transdermal Weekly  . gabapentin  300 mg Oral QHS  . hydroxychloroquine  200 mg Oral BID  . predniSONE  5 mg Oral q morning - 10a  . saccharomyces boulardii  250 mg Oral BID   Assessment: 22 yoF presents with generalized weakness, vomiting, and increased confusion to be started on IV Zosyn and Daptomycin to rule out sepsis.  PMH is significant for recurrent enterococcal endocarditis.  Patient received one dose of vancomycin and zosyn in ED.  Urinalysis was not significant for UTI.  Urine cultures and blood cultures are pending.  She is afebrile, WBC 11.6, and CrCl 26.3.  Patient also has a PMH significant for Afib and prothetic aortic valve and was on warfarin outpatient with plans to continue therapy inpatient.  INR on admission was 1.96 (Goal 2-3) with documented home regimen Warfarin 4 mg daily.  Goal of Therapy:  INR 2-3  Plan:  1) Start Zosyn 3.375  g Q 8 hours 2) Start Daptomycin 500 mg Q 48 hours 3) Give warfarin 4 mg X 1 4) Daily INR  Theron Arista, PharmD Clinical Pharmacist - Resident Pager: [phone] 8/15/20151:44 PM

## 2013-11-08 DIAGNOSIS — R740 Nonspecific elevation of levels of transaminase and lactic acid dehydrogenase [LDH]: Secondary | ICD-10-CM

## 2013-11-08 DIAGNOSIS — I38 Endocarditis, valve unspecified: Secondary | ICD-10-CM

## 2013-11-08 DIAGNOSIS — D62 Acute posthemorrhagic anemia: Secondary | ICD-10-CM

## 2013-11-08 DIAGNOSIS — R0902 Hypoxemia: Secondary | ICD-10-CM

## 2013-11-08 DIAGNOSIS — R7401 Elevation of levels of liver transaminase levels: Secondary | ICD-10-CM

## 2013-11-08 LAB — IRON AND TIBC
Iron: 86 ug/dL (ref 42–135)
SATURATION RATIOS: 36 % (ref 20–55)
TIBC: 242 ug/dL — ABNORMAL LOW (ref 250–470)
UIBC: 156 ug/dL (ref 125–400)

## 2013-11-08 LAB — URINE CULTURE
COLONY COUNT: NO GROWTH
Culture: NO GROWTH

## 2013-11-08 LAB — PROTIME-INR
INR: 3.14 — ABNORMAL HIGH (ref 0.00–1.49)
PROTHROMBIN TIME: 32.3 s — AB (ref 11.6–15.2)

## 2013-11-08 LAB — CBC
HCT: 31.3 % — ABNORMAL LOW (ref 36.0–46.0)
Hemoglobin: 10.2 g/dL — ABNORMAL LOW (ref 12.0–15.0)
MCH: 31.8 pg (ref 26.0–34.0)
MCHC: 32.6 g/dL (ref 30.0–36.0)
MCV: 97.5 fL (ref 78.0–100.0)
PLATELETS: 59 10*3/uL — AB (ref 150–400)
RBC: 3.21 MIL/uL — ABNORMAL LOW (ref 3.87–5.11)
RDW: 16.5 % — ABNORMAL HIGH (ref 11.5–15.5)
WBC: 9.2 10*3/uL (ref 4.0–10.5)

## 2013-11-08 LAB — LACTIC ACID, PLASMA: Lactic Acid, Venous: 2.6 mmol/L — ABNORMAL HIGH (ref 0.5–2.2)

## 2013-11-08 LAB — COMPREHENSIVE METABOLIC PANEL
ALK PHOS: 124 U/L — AB (ref 39–117)
ALT: 1428 U/L — AB (ref 0–35)
AST: 1870 U/L — ABNORMAL HIGH (ref 0–37)
Albumin: 2.7 g/dL — ABNORMAL LOW (ref 3.5–5.2)
Anion gap: 15 (ref 5–15)
BUN: 32 mg/dL — ABNORMAL HIGH (ref 6–23)
CHLORIDE: 106 meq/L (ref 96–112)
CO2: 14 mEq/L — ABNORMAL LOW (ref 19–32)
Calcium: 8.9 mg/dL (ref 8.4–10.5)
Creatinine, Ser: 1.89 mg/dL — ABNORMAL HIGH (ref 0.50–1.10)
GFR calc Af Amer: 29 mL/min — ABNORMAL LOW (ref >90)
GFR, EST NON AFRICAN AMERICAN: 25 mL/min — AB (ref >90)
GLUCOSE: 109 mg/dL — AB (ref 70–99)
POTASSIUM: 5.9 meq/L — AB (ref 3.7–5.3)
SODIUM: 135 meq/L — AB (ref 137–147)
Total Bilirubin: 2.1 mg/dL — ABNORMAL HIGH (ref 0.3–1.2)
Total Protein: 5.9 g/dL — ABNORMAL LOW (ref 6.0–8.3)

## 2013-11-08 LAB — RETICULOCYTES
RBC.: 3.58 MIL/uL — ABNORMAL LOW (ref 3.87–5.11)
RETIC CT PCT: 3.1 % (ref 0.4–3.1)
Retic Count, Absolute: 111 10*3/uL (ref 19.0–186.0)

## 2013-11-08 LAB — OCCULT BLOOD X 1 CARD TO LAB, STOOL: Fecal Occult Bld: POSITIVE — AB

## 2013-11-08 MED ORDER — SODIUM POLYSTYRENE SULFONATE 15 GM/60ML PO SUSP
30.0000 g | Freq: Once | ORAL | Status: AC
  Administered 2013-11-08: 30 g via ORAL
  Filled 2013-11-08: qty 120

## 2013-11-08 MED ORDER — SODIUM CHLORIDE 0.9 % IV SOLN
1.0000 g | Freq: Once | INTRAVENOUS | Status: AC
  Administered 2013-11-08: 1 g via INTRAVENOUS
  Filled 2013-11-08: qty 10

## 2013-11-08 MED ORDER — DEXTROSE 5 % IV SOLN
5.0000 mg/h | INTRAVENOUS | Status: AC
  Filled 2013-11-08: qty 100

## 2013-11-08 MED ORDER — POTASSIUM CHLORIDE 20 MEQ/15ML (10%) PO LIQD: 20.0000 meq | Freq: Two times a day (BID) | ORAL | Status: DC

## 2013-11-08 MED ORDER — PANTOPRAZOLE SODIUM 40 MG IV SOLR
40.0000 mg | INTRAVENOUS | Status: DC
  Administered 2013-11-08 – 2013-11-10 (×3): 40 mg via INTRAVENOUS
  Filled 2013-11-08 (×4): qty 40

## 2013-11-08 MED ORDER — HYDROCORTISONE NA SUCCINATE PF 100 MG IJ SOLR: 50.0000 mg | Freq: Three times a day (TID) | INTRAMUSCULAR | Status: DC

## 2013-11-08 MED ORDER — HYDROCORTISONE NA SUCCINATE PF 100 MG IJ SOLR
100.0000 mg | Freq: Three times a day (TID) | INTRAMUSCULAR | Status: DC
  Administered 2013-11-08 – 2013-11-10 (×5): 100 mg via INTRAVENOUS
  Filled 2013-11-08 (×8): qty 2

## 2013-11-08 MED ORDER — DILTIAZEM HCL 60 MG PO TABS
60.0000 mg | ORAL_TABLET | Freq: Three times a day (TID) | ORAL | Status: DC
Start: 2013-11-08 — End: 2013-11-08
  Filled 2013-11-08 (×3): qty 1

## 2013-11-08 MED ORDER — CALCIUM GLUCONATE 10 % IV SOLN: 1.0000 g | Freq: Once | INTRAVENOUS | Status: DC

## 2013-11-08 NOTE — Progress Notes (Signed)
Unit CM UR Completed by [facility] ED CM  W. Kierstan Auer RN  

## 2013-11-08 NOTE — Progress Notes (Signed)
Placed pt. On bipap,. Pt. Is tolerating well at this time.

## 2013-11-08 NOTE — Progress Notes (Addendum)
[facility]                                                                              Patient Demographics  [name], is a 73 y.o. female, DOB - 09/08/1940, IRJ:[id number]  Admit date - 11/07/2013   Admitting Physician Cristal Ford, DO  Outpatient Primary MD for the patient is REED, TIFFANY, DO  LOS - 1   Chief Complaint  Patient presents with  . Tachycardia      HPI on 11/07/2013 [name] is a 73 y.o. 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 Patient resides with her daughter who was at bedside, during admission. Patient's daughter stated that she was complaining of weakness and fatigue for the past several days and has had a poor appetite. Daughter stated that she has been somewhat confused and very sleepy. She had 4 episodes of vomiting, nonbilious and nonbloody. Patient's daughter stated she had labs done at Dr. Zenia Resides office this past Wednesday and were apparently were normal. Patient required BiPAP while in the emergency room due to respiratory acidosis and tachypnea. She was able to answer yes and no questions. Patient denied any chest pain, abdominal pain, further nausea and vomiting, headache, dizziness. Per daughter, no recent travel or ill contacts. While in the emergency department, patient was noted to have atrial fibrillation with RVR, and started on diltiazem drip   Assessment & Plan   Severe Sepsis with lactic acidosis  -Secondary to unknown source at this time. Upon admission, patient does have a slight leukocytosis, and tachycardic as well as tachycardic. -Leukocytosis resolved, tachycardia resolved, afebrile  -Could possibly be secondary to viral gastroenteritis versus recurrent endocarditis  -Chest x-ray negative for  acute cardiopulmonary process, UA negative for infection -Blood cultures pending -Continue broad-spectrum antibiotics with daptomyocin pharmacy to dose and follow  -Patient currently has no diarrhea or complaints of abdominal pain  -Infectious disease, Dr. Linus Salmons, consulted and appreciated -Repeat lactic acid pending  Acute hypoxic respiratory failure  -Improved, Continue patient on BiPAP  -Patient does not wish to be intubated  -Possibly secondary to patient's A. fib RVR versus sepsis   Atrial fibrillation with RVR  -Possibly secondary to sepsis  -Continue diltiazem drip, rate improving  -Cardiology consulted and appreciated  -TSH 2.17, Magnesium 2.2, phos 4.4 -Continue Coumadin per pharmacy   Acute kidney injury on CAD stage III  -Creatinine currently 1.8, baseline usually less than 1  -Likely secondary to sepsis  -Continue gentle IV fluids and continue to monitor   Hyperkalemia -Patient was given calcium gluconate 1 insulin and D50 and Kayexalate in the emergency department -Potassium continues to be elevated -Will order additional dose of Kayexalate and continue to monitor BMP  Elevated transaminases  -Likely secondary to sepsis/shock liver -Continue to rise   -Abdominal ultrasound: no acute abnormality seen within the abdomen -Continue to monitor LFTs   Nausea and vomiting  -Resolved, Likely related to sepsis versus gastroenteritis  -Will continue antiemetic PRN  History of lupus  -Continue steroids and plaquenil   Hyperlipidemia  -  Continue statin   Chronic diastolic heart failure  -Will hold diuretics at this time due to patient's sepsis  -Currently compensated  -Will continue to monitor her daily weights and strict intake and output   Recurrent enterococcal endocarditis  -Will hold ampicillin  -Continue daptomycin and Zosyn  -Blood cultures pending  -Infectious disease consulted and appreciated  Normocytic Anemia -Will obtain anemia panel and fecal  occult -continue to monitor CBC  Code Status: DNR/DNI  Family Communication: None at bedside  Disposition Plan: Admitted  Time Spent in minutes   30 minutes  Procedures  None  Consults   Cardiology Infectious Disease  DVT Prophylaxis  Coumadin  Lab Results  Component Value Date   PLT 59* 11/08/2013    Medications  Scheduled Meds: . antiseptic oral rinse  7 mL Mouth Rinse BID  . buprenorphine  5 mcg Transdermal Weekly  . calcium chloride  IV  1 g Intravenous Once  . DAPTOmycin (CUBICIN)  IV  500 mg Intravenous Q48H  . gabapentin  300 mg Oral QHS  . hydroxychloroquine  200 mg Oral BID  . piperacillin-tazobactam (ZOSYN)  IV  3.375 g Intravenous 3 times per day  . potassium chloride  20 mEq Oral BID  . predniSONE  5 mg Oral q morning - 10a  . saccharomyces boulardii  250 mg Oral BID  . sodium polystyrene  30 g Oral Once  . Warfarin - Pharmacist Dosing Inpatient   Does not apply q1800   Continuous Infusions: . dextrose 5 % and 0.45% NaCl 75 mL/hr at 11/07/13 1800  . dextrose 5 % and 0.9% NaCl 75 mL/hr at 11/07/13 0850  . diltiazem (CARDIZEM) infusion 5 mg/hr (11/07/13 2012)   PRN Meds:.acetaminophen, acetaminophen, dextrose, ondansetron (ZOFRAN) IV, ondansetron  Antibiotics    Anti-infectives   Start     Dose/Rate Route Frequency Ordered Stop   11/07/13 1400  DAPTOmycin (CUBICIN) 500 mg in sodium chloride 0.9 % IVPB     500 mg 220 mL/hr over 30 Minutes Intravenous Every 48 hours 11/07/13 1352     11/07/13 1400  piperacillin-tazobactam (ZOSYN) IVPB 3.375 g     3.375 g 12.5 mL/hr over 240 Minutes Intravenous 3 times per day 11/07/13 1352     11/07/13 1330  hydroxychloroquine (PLAQUENIL) tablet 200 mg     200 mg Oral 2 times daily 11/07/13 1237     11/07/13 0830  vancomycin (VANCOCIN) IVPB 1000 mg/200 mL premix     1,000 mg 200 mL/hr over 60 Minutes Intravenous  Once 11/07/13 0824 11/07/13 1021   11/07/13 0830  piperacillin-tazobactam (ZOSYN) IVPB 3.375 g      3.375 g 100 mL/hr over 30 Minutes Intravenous  Once 11/07/13 0824 11/07/13 1026        Subjective:   [name] seen and examined today.  Patient currently wearing BiPAP.  Able to answer questions.  No complaints of chest pain abdominal pain, nausea, vomiting, diarrhea at this time  Objective:   Filed Vitals:   11/08/13 0015 11/08/13 0300 11/08/13 0719 11/08/13 0759  BP: 114/60 107/61 98/46   Pulse: 93 84 82 75  Temp: 97.6 F (36.4 C) 97.9 F (36.6 C) 98.5 F (36.9 C)   TempSrc: Axillary Axillary Axillary   Resp: 28 22 24 27   Height:      Weight:  73.1 kg (161 lb 2.5 oz)    SpO2: 98% 100% 100% 98%    Wt Readings from Last 3 Encounters:  11/08/13 73.1 kg (161 lb 2.5  oz)  09/28/13 68.04 kg (150 lb)  09/15/13 60.873 kg (134 lb 3.2 oz)     Intake/Output Summary (Last 24 hours) at 11/08/13 0900 Last data filed at 11/08/13 0400  Gross per 24 hour  Intake 1932.92 ml  Output      0 ml  Net 1932.92 ml    Exam General: Well developed, well nourished, comfortable on BiPap, appears stated age  HEENT: NCAT, PERRLA, EOMI, Anicteic Sclera, mucous membranes dry, on bipap  Neck: Supple, no JVD, no masses  Cardiovascular: S1 S2 auscultated,+3/6 SEM, Irregular, tachycardic  Respiratory: Clear to auscultation bilaterally with equal chest rise  Abdomen: Soft, nontender, nondistended, + bowel sounds  Extremities: dry without cyanosis clubbing or edema, feet cool to touch and cyanotic- but good pulses  Neuro: AAOx3, cranial nerves grossly intact. Strength equal and bilateral  Skin: Without rashes exudates or nodules  Data Review   Micro Results Recent Results (from the past 240 hour(s))  MRSA PCR SCREENING     Status: None   Collection Time    11/07/13  2:09 PM      Result Value Ref Range Status   MRSA by PCR NEGATIVE  NEGATIVE Final   Comment:            The GeneXpert MRSA Assay (FDA     approved for NASAL specimens     only), is one component of a     comprehensive  MRSA colonization     surveillance program. It is not     intended to diagnose MRSA     infection nor to guide or     monitor treatment for     MRSA infections.    Radiology Reports US Abdomen Complete  11/07/2013   CLINICAL DATA:  Severe sepsis.  Elevated LFTs.  EXAM: ULTRASOUND ABDOMEN COMPLETE  COMPARISON:  CT abdomen and pelvis from 08/03/2013, and renal ultrasound performed 02/13/2012  FINDINGS: Gallbladder:  Contracted and not well assessed. A 2.0 cm stone is noted within the gallbladder. This appearance is grossly unchanged from the prior CT. No ultrasonographic Murphy's sign is elicited.  Common bile duct:  Diameter: 0.4 cm, within normal limits in caliber.  Liver:  No focal lesion identified. Diffuse soft tissue thickening about the porta hepatis is thought to reflect periportal edema, also noted on prior CT. Within normal limits in parenchymal echogenicity. Trace ascites is noted about the liver.  IVC:  Not fully characterized, but grossly unremarkable in appearance.  Pancreas:  The visualized portions of the pancreas are grossly unremarkable, though the pancreas is difficult to fully assess.  Spleen:  Size and appearance within normal limits.  Right Kidney:  Length: 13.1 cm. Echogenicity within normal limits. Trace perinephric fluid is noted. No hydronephrosis visualized. A 2.3 cm cyst is noted at the lower pole of the right kidney.  Left Kidney:  Length: 12.7 cm. Echogenicity within normal limits. Trace perinephric fluid is noted. No hydronephrosis visualized. A 0.9 cm cyst is noted at the upper pole of the left kidney.  Abdominal aorta:  No aneurysm visualized. Not fully characterized due to overlying bowel gas.  Other findings:  None.  IMPRESSION: 1. No acute abnormality seen within the abdomen. Evaluation is mildly suboptimal due to bowel gas. 2. Gallbladder contracted and not well assessed, with a 2.0 cm stone in the gallbladder. This appearance is stable from the prior CT. No  ultrasonographic Murphy's sign elicited. No evidence for acute cholecystitis or obstruction. 3. Periportal edema again noted. 4. Trace perinephric  fluid noted bilaterally, mildly more prominent than on the prior CT and of uncertain significance. Small bilateral renal cysts seen. 5. Trace ascites noted about the liver.   Electronically Signed   By: Garald Balding M.D.   On: 11/07/2013 21:23   Dg Chest Portable 1 View  11/07/2013   CLINICAL DATA:  Generalized weakness  EXAM: PORTABLE CHEST - 1 VIEW  COMPARISON:  09/12/2013  FINDINGS: Stable changes from previous cardiac surgery. Cardiac silhouette is enlarged. No mediastinal or hilar masses.  Mild stable interstitial thickening. Lungs are otherwise clear. No pleural effusion or pneumothorax.  Bony thorax is demineralized but grossly intact.  IMPRESSION: No acute cardiopulmonary disease.   Electronically Signed   By: Lajean Manes M.D.   On: 11/07/2013 09:29    CBC  Recent Labs Lab 11/07/13 0735 11/07/13 0752 11/08/13 0355  WBC 11.6*  --  9.2  HGB 13.1 15.0 10.2*  HCT 40.9 44.0 31.3*  PLT 100*  --  59*  MCV 99.8  --  97.5  MCH 32.0  --  31.8  MCHC 32.0  --  32.6  RDW 16.8*  --  16.5*  LYMPHSABS 2.9  --   --   MONOABS 1.6*  --   --   EOSABS 0.1  --   --   BASOSABS 0.1  --   --     Chemistries   Recent Labs Lab 11/07/13 0735 11/07/13 0752 11/07/13 1340 11/08/13 0355  NA 142 140  --  135*  K 6.4* 5.9*  --  5.9*  CL 106 117*  --  106  CO2 10*  --   --  14*  GLUCOSE 32* 30*  --  109*  BUN 25* 32*  --  32*  CREATININE 1.65* 1.80*  --  1.89*  CALCIUM 10.1  --   --  8.9  MG  --   --  2.2  --   AST 244*  --   --  1870*  ALT 179*  --   --  1428*  ALKPHOS 170*  --   --  124*  BILITOT 2.7*  --   --  2.1*   ------------------------------------------------------------------------------------------------------------------ estimated creatinine clearance is 25 ml/min (by C-G formula based on Cr of  1.89). ------------------------------------------------------------------------------------------------------------------ No results found for this basename: HGBA1C,  in the last 72 hours ------------------------------------------------------------------------------------------------------------------ No results found for this basename: CHOL, HDL, LDLCALC, TRIG, CHOLHDL, LDLDIRECT,  in the last 72 hours ------------------------------------------------------------------------------------------------------------------  Recent Labs  11/07/13 1340  TSH 2.170   ------------------------------------------------------------------------------------------------------------------ No results found for this basename: VITAMINB12, FOLATE, FERRITIN, TIBC, IRON, RETICCTPCT,  in the last 72 hours  Coagulation profile  Recent Labs Lab 11/07/13 0735 11/08/13 0355  INR 1.96* 3.14*    No results found for this basename: DDIMER,  in the last 72 hours  Cardiac Enzymes No results found for this basename: CK, CKMB, TROPONINI, MYOGLOBIN,  in the last 168 hours ------------------------------------------------------------------------------------------------------------------ No components found with this basename: POCBNP,     [name] D.O. on 11/08/2013 at 9:00 AM  Between 7am to 7pm - Pager - [phone]  After 7pm go to [email or web address] - password [email or web address]  And look for the night coverage person covering for me after hours  [facility] Office  [phone]

## 2013-11-08 NOTE — Progress Notes (Signed)
[facility]  Date of Admission:  11/07/2013  Antibiotics: daptomycin zosyn  Subjective: Feels better, still some sob  Objective: Temp:  [97.3 F (36.3 C)-98.5 F (36.9 C)] 98.3 F (36.8 C) (08/16 1141) Pulse Rate:  [75-96] 79 (08/16 1141) Resp:  [22-32] 22 (08/16 1141) BP: (98-114)/(46-78) 103/73 mmHg (08/16 1141) SpO2:  [92 %-100 %] 96 % (08/16 1141) FiO2 (%):  [80 %-100 %] 80 % (08/16 0719) Weight:  [161 lb 2.5 oz (73.1 kg)] 161 lb 2.5 oz (73.1 kg) (08/16 0300)  General: awake, in bed, facemask in place Skin: no rashes Lungs: CTA B, some mild tachypnea but able to speak in short sentences Cor: irr irr with rate around 85-95 Abdomen: Ext: no edema  Lab Results Lab Results  Component Value Date   WBC 9.2 11/08/2013   HGB 10.2* 11/08/2013   HCT 31.3* 11/08/2013   MCV 97.5 11/08/2013   PLT 59* 11/08/2013    Lab Results  Component Value Date   CREATININE 1.89* 11/08/2013   BUN 32* 11/08/2013   NA 135* 11/08/2013   K 5.9* 11/08/2013   CL 106 11/08/2013   CO2 14* 11/08/2013    Lab Results  Component Value Date   ALT 1428* 11/08/2013   AST 1870* 11/08/2013   ALKPHOS 124* 11/08/2013   BILITOT 2.1* 11/08/2013      Microbiology: Recent Results (from the past 240 hour(s))  CULTURE, BLOOD (ROUTINE X 2)     Status: None   Collection Time    11/07/13  8:20 AM      Result Value Ref Range Status   Specimen Description BLOOD LEFT ARM   Final   Special Requests BOTTLES DRAWN AEROBIC ONLY 4CC   Final   Culture  Setup Time     Final   Value: 11/07/2013 18:00     Performed at [facility]   Culture     Final   Value:        BLOOD CULTURE RECEIVED NO GROWTH TO DATE CULTURE WILL BE HELD FOR 5 DAYS BEFORE ISSUING A FINAL NEGATIVE REPORT     Performed at [facility]   Report Status PENDING   Incomplete  CULTURE, BLOOD (ROUTINE X 2)     Status: None   Collection Time    11/07/13  8:27 AM      Result Value Ref Range Status   Specimen  Description BLOOD LEFT HAND   Final   Special Requests BOTTLES DRAWN AEROBIC ONLY 3CC   Final   Culture  Setup Time     Final   Value: 11/07/2013 18:00     Performed at [facility]   Culture     Final   Value:        BLOOD CULTURE RECEIVED NO GROWTH TO DATE CULTURE WILL BE HELD FOR 5 DAYS BEFORE ISSUING A FINAL NEGATIVE REPORT     Performed at [facility]   Report Status PENDING   Incomplete  MRSA PCR SCREENING     Status: None   Collection Time    11/07/13  2:09 PM      Result Value Ref Range Status   MRSA by PCR NEGATIVE  NEGATIVE Final   Comment:            The GeneXpert MRSA Assay (FDA     approved for NASAL specimens     only), is one component of a     comprehensive MRSA colonization  surveillance program. It is not     intended to diagnose MRSA     infection nor to guide or     monitor treatment for     MRSA infections.    Studies/Results: US Abdomen Complete  11/07/2013   CLINICAL DATA:  Severe sepsis.  Elevated LFTs.  EXAM: ULTRASOUND ABDOMEN COMPLETE  COMPARISON:  CT abdomen and pelvis from 08/03/2013, and renal ultrasound performed 02/13/2012  FINDINGS: Gallbladder:  Contracted and not well assessed. A 2.0 cm stone is noted within the gallbladder. This appearance is grossly unchanged from the prior CT. No ultrasonographic Murphy's sign is elicited.  Common bile duct:  Diameter: 0.4 cm, within normal limits in caliber.  Liver:  No focal lesion identified. Diffuse soft tissue thickening about the porta hepatis is thought to reflect periportal edema, also noted on prior CT. Within normal limits in parenchymal echogenicity. Trace ascites is noted about the liver.  IVC:  Not fully characterized, but grossly unremarkable in appearance.  Pancreas:  The visualized portions of the pancreas are grossly unremarkable, though the pancreas is difficult to fully assess.  Spleen:  Size and appearance within normal limits.  Right Kidney:  Length: 13.1 cm. Echogenicity  within normal limits. Trace perinephric fluid is noted. No hydronephrosis visualized. A 2.3 cm cyst is noted at the lower pole of the right kidney.  Left Kidney:  Length: 12.7 cm. Echogenicity within normal limits. Trace perinephric fluid is noted. No hydronephrosis visualized. A 0.9 cm cyst is noted at the upper pole of the left kidney.  Abdominal aorta:  No aneurysm visualized. Not fully characterized due to overlying bowel gas.  Other findings:  None.  IMPRESSION: 1. No acute abnormality seen within the abdomen. Evaluation is mildly suboptimal due to bowel gas. 2. Gallbladder contracted and not well assessed, with a 2.0 cm stone in the gallbladder. This appearance is stable from the prior CT. No ultrasonographic Murphy's sign elicited. No evidence for acute cholecystitis or obstruction. 3. Periportal edema again noted. 4. Trace perinephric fluid noted bilaterally, mildly more prominent than on the prior CT and of uncertain significance. Small bilateral renal cysts seen. 5. Trace ascites noted about the liver.   Electronically Signed   By: Garald Balding M.D.   On: 11/07/2013 21:23   Dg Chest Portable 1 View  11/07/2013   CLINICAL DATA:  Generalized weakness  EXAM: PORTABLE CHEST - 1 VIEW  COMPARISON:  09/12/2013  FINDINGS: Stable changes from previous cardiac surgery. Cardiac silhouette is enlarged. No mediastinal or hilar masses.  Mild stable interstitial thickening. Lungs are otherwise clear. No pleural effusion or pneumothorax.  Bony thorax is demineralized but grossly intact.  IMPRESSION: No acute cardiopulmonary disease.   Electronically Signed   By: Lajean Manes M.D.   On: 11/07/2013 09:29    Assessment/Plan: 1) Possible IE - no evidence so far, blood cultures remain negative, afebrile, WBC wnl.  Will continue with daptomycin and await blood cultures.  If remain negative, will d/c.    2) Afib - has not converted.    3) transaminitis - increased today, alk phos ok. May be due to #1 or 2 in the  setting of shock.     4) lactic acidosis - much improved.    [name], [facility] [place] [email or web address] [phone] pager   [phone] cell 11/08/2013, 1:02 PM

## 2013-11-08 NOTE — Progress Notes (Signed)
ANTICOAGULATION CONSULT NOTE - Follow Up Consult  Pharmacy Consult for warfarin Indication: atrial fibrillation  Allergies  Allergen Reactions  . Ceftriaxone     RASH BUT TOLERATED AMPICILLIN AND IMIPENEM WITHOUT PROBLEMS  . Codeine Nausea Only    Patient Measurements: Height: 5\' 7"  (170.2 cm) Weight: 161 lb 2.5 oz (73.1 kg) IBW/kg (Calculated) : 61.6  Vital Signs: Temp: 98.5 F (36.9 C) (08/16 0719) Temp src: Axillary (08/16 0719) BP: 98/46 mmHg (08/16 0719) Pulse Rate: 75 (08/16 0759)  Labs:  Recent Labs  11/07/13 0140  11/07/13 0735 11/07/13 0752 11/08/13 0355  HGB  --   < > 13.1 15.0 10.2*  HCT  --   --  40.9 44.0 31.3*  PLT  --   --  100*  --  59*  LABPROT  --   --  22.3*  --  32.3*  INR  --   --  1.96*  --  3.14*  CREATININE  --   --  1.65* 1.80* 1.89*  CKTOTAL 156  --   --   --   --   < > = values in this interval not displayed.  Estimated Creatinine Clearance: 25 ml/min (by C-G formula based on Cr of 1.89).   Assessment: 7 yoF presents with generalized weakness, vomiting, and increased confusion started on IV Zosyn and Daptomycin to rule out sepsis. 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  Her documented home regimen was Warfarin 4 mg Daily.  On admission, INR was 1.96.  She received warfarin 4 mg last night.  Today INR is 3.14.  LFTs are significantly elevated which likely accounts for the large jump in INR. Hgb and Plts are trending down. No signs of bleeding documented. FOBT ordered. Will plan to hold warfarin tonight and reevaluate tomorrow.  Goal of Therapy:  INR 2-3 Monitor platelets by anticoagulation protocol: Yes   Plan:  1) Hold Warfarin X 1 Tonight 2) Daily INR 3) Continue Zosyn 3.375 g Q 8 hours 4) Continue Daptomycin 500 mg Q 48 hours 5) Monitor liver enzymes and CK.  Consider checking next CK earlier than one week.  If elevated, may need to consider D/Cing  Daptomycin.  Theron Arista, PharmD Clinical Pharmacist - Resident Pager: [phone] 8/16/20158:32 AM

## 2013-11-09 DIAGNOSIS — M329 Systemic lupus erythematosus, unspecified: Secondary | ICD-10-CM

## 2013-11-09 LAB — COMPREHENSIVE METABOLIC PANEL
ALBUMIN: 2.5 g/dL — AB (ref 3.5–5.2)
ALT: 1006 U/L — AB (ref 0–35)
AST: 562 U/L — ABNORMAL HIGH (ref 0–37)
Alkaline Phosphatase: 119 U/L — ABNORMAL HIGH (ref 39–117)
Anion gap: 15 (ref 5–15)
BILIRUBIN TOTAL: 2.1 mg/dL — AB (ref 0.3–1.2)
BUN: 27 mg/dL — ABNORMAL HIGH (ref 6–23)
CHLORIDE: 106 meq/L (ref 96–112)
CO2: 16 mEq/L — ABNORMAL LOW (ref 19–32)
Calcium: 8.7 mg/dL (ref 8.4–10.5)
Creatinine, Ser: 1.36 mg/dL — ABNORMAL HIGH (ref 0.50–1.10)
GFR calc Af Amer: 43 mL/min — ABNORMAL LOW (ref >90)
GFR calc non Af Amer: 37 mL/min — ABNORMAL LOW (ref >90)
GLUCOSE: 171 mg/dL — AB (ref 70–99)
POTASSIUM: 4.1 meq/L (ref 3.7–5.3)
Sodium: 137 mEq/L (ref 137–147)
TOTAL PROTEIN: 5.8 g/dL — AB (ref 6.0–8.3)

## 2013-11-09 LAB — VITAMIN B12: Vitamin B-12: 1735 pg/mL — ABNORMAL HIGH (ref 211–911)

## 2013-11-09 LAB — CBC
HEMATOCRIT: 32.4 % — AB (ref 36.0–46.0)
HEMOGLOBIN: 10.7 g/dL — AB (ref 12.0–15.0)
MCH: 31.6 pg (ref 26.0–34.0)
MCHC: 33 g/dL (ref 30.0–36.0)
MCV: 95.6 fL (ref 78.0–100.0)
Platelets: 60 10*3/uL — ABNORMAL LOW (ref 150–400)
RBC: 3.39 MIL/uL — ABNORMAL LOW (ref 3.87–5.11)
RDW: 16.1 % — AB (ref 11.5–15.5)
WBC: 6.6 10*3/uL (ref 4.0–10.5)

## 2013-11-09 LAB — PROTIME-INR
INR: 2.98 — AB (ref 0.00–1.49)
Prothrombin Time: 31 seconds — ABNORMAL HIGH (ref 11.6–15.2)

## 2013-11-09 LAB — CORTISOL: Cortisol, Plasma: 13.3 ug/dL

## 2013-11-09 LAB — FOLATE: Folate: >20 ng/mL

## 2013-11-09 LAB — FERRITIN: FERRITIN: 3177 ng/mL — AB (ref 10–291)

## 2013-11-09 MED ORDER — WARFARIN SODIUM 1 MG PO TABS
1.0000 mg | ORAL_TABLET | Freq: Once | ORAL | Status: AC
  Administered 2013-11-09: 1 mg via ORAL
  Filled 2013-11-09: qty 1

## 2013-11-09 MED ORDER — AMOXICILLIN 500 MG PO CAPS
500.0000 mg | ORAL_CAPSULE | Freq: Two times a day (BID) | ORAL | Status: DC
  Administered 2013-11-09 – 2013-11-13 (×9): 500 mg via ORAL
  Filled 2013-11-09 (×10): qty 1

## 2013-11-09 NOTE — Progress Notes (Signed)
[facility]  Date of Admission:  11/07/2013  Antibiotics: daptomycin zosyn  Subjective: Feels much better today  Objective: Temp:  [97.6 F (36.4 C)-98.8 F (37.1 C)] 98.2 F (36.8 C) (08/17 0846) Pulse Rate:  [74-118] 79 (08/17 0846) Resp:  [22-30] 28 (08/17 0846) BP: (115-143)/(65-82) 129/68 mmHg (08/17 0846) SpO2:  [95 %-100 %] 95 % (08/17 0846) FiO2 (%):  [60 %] 60 % (08/17 0319) Weight:  [163 lb 2.3 oz (74 kg)] 163 lb 2.3 oz (74 kg) (08/17 0319)  General: awake, in bed, speaking in full sentences and good spirits Skin: no rashes Lungs: CTA B, some mild tachypnea but able to speak in short sentences Cor: irr irr with rate around 85-95 Abdomen: Ext: no edema  Lab Results Lab Results  Component Value Date   WBC 6.6 11/09/2013   HGB 10.7* 11/09/2013   HCT 32.4* 11/09/2013   MCV 95.6 11/09/2013   PLT 60* 11/09/2013    Lab Results  Component Value Date   CREATININE 1.36* 11/09/2013   BUN 27* 11/09/2013   NA 137 11/09/2013   K 4.1 11/09/2013   CL 106 11/09/2013   CO2 16* 11/09/2013    Lab Results  Component Value Date   ALT 1006* 11/09/2013   AST 562* 11/09/2013   ALKPHOS 119* 11/09/2013   BILITOT 2.1* 11/09/2013      Microbiology: Recent Results (from the past 240 hour(s))  URINE CULTURE     Status: None   Collection Time    11/07/13  7:56 AM      Result Value Ref Range Status   Specimen Description URINE, CATHETERIZED   Final   Special Requests NONE   Final   Culture  Setup Time     Final   Value: 11/07/2013 18:34     Performed at [place]     Final   Value: NO GROWTH     Performed at [facility]   Culture     Final   Value: NO GROWTH     Performed at [facility]   Report Status 11/08/2013 FINAL   Final  CULTURE, BLOOD (ROUTINE X 2)     Status: None   Collection Time    11/07/13  8:20 AM      Result Value Ref Range Status   Specimen Description BLOOD LEFT ARM   Final   Special  Requests BOTTLES DRAWN AEROBIC ONLY 4CC   Final   Culture  Setup Time     Final   Value: 11/07/2013 18:00     Performed at [facility]   Culture     Final   Value:        BLOOD CULTURE RECEIVED NO GROWTH TO DATE CULTURE WILL BE HELD FOR 5 DAYS BEFORE ISSUING A FINAL NEGATIVE REPORT     Performed at [facility]   Report Status PENDING   Incomplete  CULTURE, BLOOD (ROUTINE X 2)     Status: None   Collection Time    11/07/13  8:27 AM      Result Value Ref Range Status   Specimen Description BLOOD LEFT HAND   Final   Special Requests BOTTLES DRAWN AEROBIC ONLY 3CC   Final   Culture  Setup Time     Final   Value: 11/07/2013 18:00     Performed at [facility]   Culture     Final   Value:  BLOOD CULTURE RECEIVED NO GROWTH TO DATE CULTURE WILL BE HELD FOR 5 DAYS BEFORE ISSUING A FINAL NEGATIVE REPORT     Performed at [facility]   Report Status PENDING   Incomplete  MRSA PCR SCREENING     Status: None   Collection Time    11/07/13  2:09 PM      Result Value Ref Range Status   MRSA by PCR NEGATIVE  NEGATIVE Final   Comment:            The GeneXpert MRSA Assay (FDA     approved for NASAL specimens     only), is one component of a     comprehensive MRSA colonization     surveillance program. It is not     intended to diagnose MRSA     infection nor to guide or     monitor treatment for     MRSA infections.    Studies/Results: US Abdomen Complete  11/07/2013   CLINICAL DATA:  Severe sepsis.  Elevated LFTs.  EXAM: ULTRASOUND ABDOMEN COMPLETE  COMPARISON:  CT abdomen and pelvis from 08/03/2013, and renal ultrasound performed 02/13/2012  FINDINGS: Gallbladder:  Contracted and not well assessed. A 2.0 cm stone is noted within the gallbladder. This appearance is grossly unchanged from the prior CT. No ultrasonographic Murphy's sign is elicited.  Common bile duct:  Diameter: 0.4 cm, within normal limits in caliber.  Liver:  No focal lesion identified.  Diffuse soft tissue thickening about the porta hepatis is thought to reflect periportal edema, also noted on prior CT. Within normal limits in parenchymal echogenicity. Trace ascites is noted about the liver.  IVC:  Not fully characterized, but grossly unremarkable in appearance.  Pancreas:  The visualized portions of the pancreas are grossly unremarkable, though the pancreas is difficult to fully assess.  Spleen:  Size and appearance within normal limits.  Right Kidney:  Length: 13.1 cm. Echogenicity within normal limits. Trace perinephric fluid is noted. No hydronephrosis visualized. A 2.3 cm cyst is noted at the lower pole of the right kidney.  Left Kidney:  Length: 12.7 cm. Echogenicity within normal limits. Trace perinephric fluid is noted. No hydronephrosis visualized. A 0.9 cm cyst is noted at the upper pole of the left kidney.  Abdominal aorta:  No aneurysm visualized. Not fully characterized due to overlying bowel gas.  Other findings:  None.  IMPRESSION: 1. No acute abnormality seen within the abdomen. Evaluation is mildly suboptimal due to bowel gas. 2. Gallbladder contracted and not well assessed, with a 2.0 cm stone in the gallbladder. This appearance is stable from the prior CT. No ultrasonographic Murphy's sign elicited. No evidence for acute cholecystitis or obstruction. 3. Periportal edema again noted. 4. Trace perinephric fluid noted bilaterally, mildly more prominent than on the prior CT and of uncertain significance. Small bilateral renal cysts seen. 5. Trace ascites noted about the liver.   Electronically Signed   By: Garald Balding M.D.   On: 11/07/2013 21:23    Assessment/Plan: 1) Possible IE - cultures have remained negative, no signs of IE.  Will d/c antibiotics and observe.    2) Afib - has not converted.    3) transaminitis - improved today.     [name], [facility] [place] [email or web address] [phone] pager     [phone] cell 11/09/2013, 12:00 PM

## 2013-11-09 NOTE — Progress Notes (Signed)
[facility]                                                                              Patient Demographics  [name], is a 73 y.o. female, DOB - [birth year], RNH:[id number]  Admit date - 11/07/2013   Admitting Physician Cristal Ford, DO  Outpatient Primary MD for the patient is REED, TIFFANY, DO  LOS - 2   Chief Complaint  Patient presents with  . Tachycardia      HPI on 11/07/2013 [name] is a 73 y.o. 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 Patient resides with her daughter who was at bedside, during admission. Patient's daughter stated that she was complaining of weakness and fatigue for the past several days and has had a poor appetite. Daughter stated that she has been somewhat confused and very sleepy. She had 4 episodes of vomiting, nonbilious and nonbloody. Patient's daughter stated she had labs done at Dr. Zenia Resides office this past Wednesday and were apparently were normal. Patient required BiPAP while in the emergency room due to respiratory acidosis and tachypnea. She was able to answer yes and no questions. Patient denied any chest pain, abdominal pain, further nausea and vomiting, headache, dizziness. Per daughter, no recent travel or ill contacts. While in the emergency department, patient was noted to have atrial fibrillation with RVR, and started on diltiazem drip   Assessment & Plan   Severe Sepsis with lactic acidosis  -Secondary to unknown source at this time. Upon admission, patient does have a slight leukocytosis, and tachycardic as well as tachycardic. -Leukocytosis resolved, tachycardia resolved, afebrile  -Could possibly be secondary to viral gastroenteritis versus recurrent endocarditis  -Chest x-ray negative for  acute cardiopulmonary process, UA negative for infection -Blood cultures show no growth to date -Continue broad-spectrum antibiotics with daptomyocin pharmacy to dose and follow  -Patient currently has no diarrhea or complaints of abdominal pain  -Infectious disease, Dr. Linus Salmons, consulted and appreciated -repeat lactic acid improved, 2.6  Acute hypoxic respiratory failure  -Improved, Continue patient on BiPAP  -Patient does not wish to be intubated  -Possibly secondary to patient's A. fib RVR versus sepsis  -Will try to wean off of BiPAP  Atrial fibrillation with RVR  -Possibly secondary to sepsis  -Continue diltiazem drip, rate improving  -Cardiology consulted and appreciated  -TSH 2.17, Magnesium 2.2, phos 4.4 -Continue Coumadin per pharmacy   Acute kidney injury on CAD stage III  -Creatinine currently 1.8, baseline usually less than 1  -Likely secondary to sepsis  -Continue gentle IV fluids and continue to monitor   Hyperkalemia -Patient was given calcium gluconate 1 insulin and D50 and Kayexalate in the emergency department -Potassium continues to be elevated -Will order additional dose of Kayexalate and continue to monitor BMP  Elevated transaminases  -Likely secondary to sepsis/shock liver -Continue to rise   -Abdominal ultrasound: no acute abnormality seen within the abdomen -Continue to monitor LFTs, pending labs this morning.    Nausea and vomiting  -Resolved, Likely related to sepsis versus gastroenteritis  -  Will continue antiemetic PRN  History of lupus  -Continue plaquenil  -Started patient on hydrocortisone -PM cortisol WNL  Hyperlipidemia  -Continue statin   Chronic diastolic heart failure  -Will hold diuretics at this time due to patient's sepsis  -Currently compensated  -Will continue to monitor her daily weights and strict intake and output   Recurrent enterococcal endocarditis  -Aampicillin held -Continue daptomycin and Zosyn  -Blood cultures  pending  -Infectious disease consulted and appreciated  Normocytic Anemia -Fecal occult was positive, however, patient on coumadin and does have external hemorrhoids, will continue to monitor CBC -Anemia panel: Iron 86, Ferritin 3177 (likely acute phase reactant)  Code Status: DNR/DNI  Family Communication: None at bedside  Disposition Plan: Admitted  Time Spent in minutes   30 minutes  Procedures  None  Consults   Cardiology Infectious Disease  DVT Prophylaxis  Coumadin  Lab Results  Component Value Date   PLT 59* 11/08/2013    Medications  Scheduled Meds: . antiseptic oral rinse  7 mL Mouth Rinse BID  . buprenorphine  5 mcg Transdermal Weekly  . DAPTOmycin (CUBICIN)  IV  500 mg Intravenous Q48H  . gabapentin  300 mg Oral QHS  . hydrocortisone sod succinate (SOLU-CORTEF) inj  100 mg Intravenous Q8H  . hydroxychloroquine  200 mg Oral BID  . pantoprazole (PROTONIX) IV  40 mg Intravenous Q24H  . piperacillin-tazobactam (ZOSYN)  IV  3.375 g Intravenous 3 times per day  . saccharomyces boulardii  250 mg Oral BID  . Warfarin - Pharmacist Dosing Inpatient   Does not apply q1800   Continuous Infusions: . dextrose 5 % and 0.45% NaCl 75 mL/hr at 11/08/13 2101  . dextrose 5 % and 0.9% NaCl 75 mL/hr at 11/07/13 0850  . diltiazem (CARDIZEM) infusion     PRN Meds:.acetaminophen, acetaminophen, dextrose, ondansetron (ZOFRAN) IV, ondansetron  Antibiotics    Anti-infectives   Start     Dose/Rate Route Frequency Ordered Stop   11/07/13 1400  DAPTOmycin (CUBICIN) 500 mg in sodium chloride 0.9 % IVPB     500 mg 220 mL/hr over 30 Minutes Intravenous Every 48 hours 11/07/13 1352     11/07/13 1400  piperacillin-tazobactam (ZOSYN) IVPB 3.375 g     3.375 g 12.5 mL/hr over 240 Minutes Intravenous 3 times per day 11/07/13 1352     11/07/13 1330  hydroxychloroquine (PLAQUENIL) tablet 200 mg     200 mg Oral 2 times daily 11/07/13 1237     11/07/13 0830  vancomycin (VANCOCIN) IVPB  1000 mg/200 mL premix     1,000 mg 200 mL/hr over 60 Minutes Intravenous  Once 11/07/13 0824 11/07/13 1021   11/07/13 0830  piperacillin-tazobactam (ZOSYN) IVPB 3.375 g     3.375 g 100 mL/hr over 30 Minutes Intravenous  Once 11/07/13 0824 11/07/13 1026        Subjective:   Hilda Lias seen and examined today.  Patient currently wearing BiPAP.  Able to answer questions.  No complaints of chest pain abdominal pain, nausea, vomiting, diarrhea at this time  Objective:   Filed Vitals:   11/08/13 2019 11/08/13 2300 11/08/13 2312 11/09/13 0319  BP: 139/75  132/65 115/66  Pulse: 104 102 115 74  Temp:   97.6 F (36.4 C) 97.8 F (36.6 C)  TempSrc:   Oral Oral  Resp: 24 30 23 29   Height:      Weight:    74 kg (163 lb 2.3 oz)  SpO2: 99% 100% 99% 100%  Wt Readings from Last 3 Encounters:  11/09/13 74 kg (163 lb 2.3 oz)  09/28/13 68.04 kg (150 lb)  09/15/13 60.873 kg (134 lb 3.2 oz)     Intake/Output Summary (Last 24 hours) at 11/09/13 3474 Last data filed at 11/09/13 0400  Gross per 24 hour  Intake 2035.08 ml  Output      0 ml  Net 2035.08 ml    Exam General: Well developed, well nourished, comfortable on BiPap, appears stated age  HEENT: NCAT, PERRLA, EOMI, Anicteic Sclera, mucous membranes dry, on bipap  Neck: Supple, no JVD, no masses  Cardiovascular: S1 S2 auscultated,+3/6 SEM, Irregular, tachycardic  Respiratory: Clear to auscultation bilaterally with equal chest rise  Abdomen: Soft, nontender, nondistended, + bowel sounds  Extremities: dry without cyanosis clubbing or edema, feet cool to touch and cyanotic- but good pulses. RUE edema (from infiltrated IV)  Neuro: AAOx3, cranial nerves grossly intact. Strength equal and bilateral  Skin: Without rashes exudates or nodules  Data Review   Micro Results Recent Results (from the past 240 hour(s))  URINE CULTURE     Status: None   Collection Time    11/07/13  7:56 AM      Result Value Ref Range Status   Specimen  Description URINE, CATHETERIZED   Final   Special Requests NONE   Final   Culture  Setup Time     Final   Value: 11/07/2013 18:34     Performed at [place]     Final   Value: NO GROWTH     Performed at [facility]   Culture     Final   Value: NO GROWTH     Performed at [facility]   Report Status 11/08/2013 FINAL   Final  CULTURE, BLOOD (ROUTINE X 2)     Status: None   Collection Time    11/07/13  8:20 AM      Result Value Ref Range Status   Specimen Description BLOOD LEFT ARM   Final   Special Requests BOTTLES DRAWN AEROBIC ONLY 4CC   Final   Culture  Setup Time     Final   Value: 11/07/2013 18:00     Performed at [facility]   Culture     Final   Value:        BLOOD CULTURE RECEIVED NO GROWTH TO DATE CULTURE WILL BE HELD FOR 5 DAYS BEFORE ISSUING A FINAL NEGATIVE REPORT     Performed at [facility]   Report Status PENDING   Incomplete  CULTURE, BLOOD (ROUTINE X 2)     Status: None   Collection Time    11/07/13  8:27 AM      Result Value Ref Range Status   Specimen Description BLOOD LEFT HAND   Final   Special Requests BOTTLES DRAWN AEROBIC ONLY 3CC   Final   Culture  Setup Time     Final   Value: 11/07/2013 18:00     Performed at [facility]   Culture     Final   Value:        BLOOD CULTURE RECEIVED NO GROWTH TO DATE CULTURE WILL BE HELD FOR 5 DAYS BEFORE ISSUING A FINAL NEGATIVE REPORT     Performed at [facility]   Report Status PENDING   Incomplete  MRSA PCR SCREENING     Status: None   Collection Time    11/07/13  2:09 PM  Result Value Ref Range Status   MRSA by PCR NEGATIVE  NEGATIVE Final   Comment:            The GeneXpert MRSA Assay (FDA     approved for NASAL specimens     only), is one component of a     comprehensive MRSA colonization     surveillance program. It is not     intended to diagnose MRSA     infection nor to guide or     monitor treatment for     MRSA  infections.    Radiology Reports US Abdomen Complete  11/07/2013   CLINICAL DATA:  Severe sepsis.  Elevated LFTs.  EXAM: ULTRASOUND ABDOMEN COMPLETE  COMPARISON:  CT abdomen and pelvis from 08/03/2013, and renal ultrasound performed 02/13/2012  FINDINGS: Gallbladder:  Contracted and not well assessed. A 2.0 cm stone is noted within the gallbladder. This appearance is grossly unchanged from the prior CT. No ultrasonographic Murphy's sign is elicited.  Common bile duct:  Diameter: 0.4 cm, within normal limits in caliber.  Liver:  No focal lesion identified. Diffuse soft tissue thickening about the porta hepatis is thought to reflect periportal edema, also noted on prior CT. Within normal limits in parenchymal echogenicity. Trace ascites is noted about the liver.  IVC:  Not fully characterized, but grossly unremarkable in appearance.  Pancreas:  The visualized portions of the pancreas are grossly unremarkable, though the pancreas is difficult to fully assess.  Spleen:  Size and appearance within normal limits.  Right Kidney:  Length: 13.1 cm. Echogenicity within normal limits. Trace perinephric fluid is noted. No hydronephrosis visualized. A 2.3 cm cyst is noted at the lower pole of the right kidney.  Left Kidney:  Length: 12.7 cm. Echogenicity within normal limits. Trace perinephric fluid is noted. No hydronephrosis visualized. A 0.9 cm cyst is noted at the upper pole of the left kidney.  Abdominal aorta:  No aneurysm visualized. Not fully characterized due to overlying bowel gas.  Other findings:  None.  IMPRESSION: 1. No acute abnormality seen within the abdomen. Evaluation is mildly suboptimal due to bowel gas. 2. Gallbladder contracted and not well assessed, with a 2.0 cm stone in the gallbladder. This appearance is stable from the prior CT. No ultrasonographic Murphy's sign elicited. No evidence for acute cholecystitis or obstruction. 3. Periportal edema again noted. 4. Trace perinephric fluid noted  bilaterally, mildly more prominent than on the prior CT and of uncertain significance. Small bilateral renal cysts seen. 5. Trace ascites noted about the liver.   Electronically Signed   By: Garald Balding M.D.   On: 11/07/2013 21:23   Dg Chest Portable 1 View  11/07/2013   CLINICAL DATA:  Generalized weakness  EXAM: PORTABLE CHEST - 1 VIEW  COMPARISON:  09/12/2013  FINDINGS: Stable changes from previous cardiac surgery. Cardiac silhouette is enlarged. No mediastinal or hilar masses.  Mild stable interstitial thickening. Lungs are otherwise clear. No pleural effusion or pneumothorax.  Bony thorax is demineralized but grossly intact.  IMPRESSION: No acute cardiopulmonary disease.   Electronically Signed   By: Lajean Manes M.D.   On: 11/07/2013 09:29    CBC  Recent Labs Lab 11/07/13 0735 11/07/13 0752 11/08/13 0355  WBC 11.6*  --  9.2  HGB 13.1 15.0 10.2*  HCT 40.9 44.0 31.3*  PLT 100*  --  59*  MCV 99.8  --  97.5  MCH 32.0  --  31.8  MCHC 32.0  --  32.6  RDW 16.8*  --  16.5*  LYMPHSABS 2.9  --   --   MONOABS 1.6*  --   --   EOSABS 0.1  --   --   BASOSABS 0.1  --   --     Chemistries   Recent Labs Lab 11/07/13 0735 11/07/13 0752 11/07/13 1340 11/08/13 0355  NA 142 140  --  135*  K 6.4* 5.9*  --  5.9*  CL 106 117*  --  106  CO2 10*  --   --  14*  GLUCOSE 32* 30*  --  109*  BUN 25* 32*  --  32*  CREATININE 1.65* 1.80*  --  1.89*  CALCIUM 10.1  --   --  8.9  MG  --   --  2.2  --   AST 244*  --   --  1870*  ALT 179*  --   --  1428*  ALKPHOS 170*  --   --  124*  BILITOT 2.7*  --   --  2.1*   ------------------------------------------------------------------------------------------------------------------ estimated creatinine clearance is 27 ml/min (by C-G formula based on Cr of 1.89). ------------------------------------------------------------------------------------------------------------------ No results found for this basename: HGBA1C,  in the last 72  hours ------------------------------------------------------------------------------------------------------------------ No results found for this basename: CHOL, HDL, LDLCALC, TRIG, CHOLHDL, LDLDIRECT,  in the last 72 hours ------------------------------------------------------------------------------------------------------------------  Recent Labs  11/07/13 1340  TSH 2.170   ------------------------------------------------------------------------------------------------------------------  Recent Labs  11/08/13 1140  VITAMINB12 1735*  FOLATE >20.0  FERRITIN 3177*  TIBC 242*  IRON 86  RETICCTPCT 3.1    Coagulation profile  Recent Labs Lab 11/07/13 0735 11/08/13 0355 11/09/13 0315  INR 1.96* 3.14* 2.98*    No results found for this basename: DDIMER,  in the last 72 hours  Cardiac Enzymes No results found for this basename: CK, CKMB, TROPONINI, MYOGLOBIN,  in the last 168 hours ------------------------------------------------------------------------------------------------------------------ No components found with this basename: POCBNP,     Norvil Martensen D.O. on 11/09/2013 at 7:18 AM  Between 7am to 7pm - Pager - [phone]  After 7pm go to [email or web address] - password [email or web address]  And look for the night coverage person covering for me after hours  [facility] Office  [phone]

## 2013-11-09 NOTE — Progress Notes (Signed)
Nutrition Brief Note  Reason for assessment: Pt was identified at nutrition risk by the malnutrition screening tool.  Pt with PMH of CAD, recurrent enterococcal endocarditis, atrial fibrillation, and CHF with chief complaints of generalized weakness and nausea and vomiting.  Pt reports she currently has an "ok" appetite and that her appetite at home was good with her consuming 3 full meals a day. Meal completion is 100%. Pt was offered oral supplements, but pt refused reporting that she is fine. Pt denies any nausea, stomach pains, or difficulties chewing/swallowing. Pt reports she is unaware of her usual body weight and reports she has lost weight. Per Epic reports, weight loss is not significant.   Pt with no observed significant signs of fat and muscle mass loss during time of visit.  No nutrition intervention at this time. If nutrition issues arise, please consult RD.  Height: 5 feet 7 inches  Wt Readings from Last 10 Encounters:  11/09/13 163 lb 2.3 oz (74 kg)  09/28/13 150 lb (68.04 kg)  09/15/13 134 lb 3.2 oz (60.873 kg)  08/31/13 140 lb (63.504 kg)  08/03/13 147 lb (66.679 kg)  07/27/13 144 lb (65.318 kg)  07/09/13 151 lb 12.8 oz (68.856 kg)  07/03/13 161 lb (73.029 kg)  06/01/13 144 lb (65.318 kg)  05/25/13 148 lb (67.132 kg)   Body mass index is 25.55 kg/(m^2).  Diet order: Regular  Kallie Locks, MS, Provisional LDN Pager # [phone] After hours/ weekend pager # [phone]

## 2013-11-09 NOTE — Progress Notes (Signed)
ANTICOAGULATION CONSULT NOTE - Follow Up Consult  Pharmacy Consult for warfarin Indication: atrial fibrillation  Allergies  Allergen Reactions  . Ceftriaxone     RASH BUT TOLERATED AMPICILLIN AND IMIPENEM WITHOUT PROBLEMS  . Codeine Nausea Only    Patient Measurements: Height: 5\' 7"  (170.2 cm) Weight: 163 lb 2.3 oz (74 kg) IBW/kg (Calculated) : 61.6  Vital Signs: Temp: 97.8 F (36.6 C) (08/17 1202) Temp src: Oral (08/17 1202) BP: 129/68 mmHg (08/17 1202) Pulse Rate: 73 (08/17 1202)  Labs:  Recent Labs  11/07/13 0140  11/07/13 0735 11/07/13 0752 11/08/13 0355 11/09/13 0315 11/09/13 0947  HGB  --   < > 13.1 15.0 10.2*  --  10.7*  HCT  --   < > 40.9 44.0 31.3*  --  32.4*  PLT  --   --  100*  --  59*  --  60*  LABPROT  --   --  22.3*  --  32.3* 31.0*  --   INR  --   --  1.96*  --  3.14* 2.98*  --   CREATININE  --   < > 1.65* 1.80* 1.89*  --  1.36*  CKTOTAL 156  --   --   --   --   --   --   < > = values in this interval not displayed.  Estimated Creatinine Clearance: 37.6 ml/min (by C-G formula based on Cr of 1.36).   Assessment: 3 yoF presents with generalized weakness, vomiting, and increased confusion. She continues on coumadin for hx of afib. INR is now therapeutic at 2.98 but at the upper end of goal range. Noted positive FOB however MD noted external hemorrhoids. Coumadin to continue. Also LFTs are significantly elevated but trending down.   Goal of Therapy:  INR 2-3   Plan:  1. Warfarin 1mg  PO x 1 tonight 2. F/u AM INR  Salome Arnt, PharmD, BCPS Pager # [phone] 11/09/2013 12:53 PM

## 2013-11-09 NOTE — Progress Notes (Signed)
Pt states she has been breathing better all day and she feels that she does not need Bipap at this time. Pt encouraged to call RT if pt changes mind. RN aware.

## 2013-11-10 DIAGNOSIS — J8410 Pulmonary fibrosis, unspecified: Secondary | ICD-10-CM

## 2013-11-10 LAB — CBC
HEMATOCRIT: 28.8 % — AB (ref 36.0–46.0)
Hemoglobin: 9.6 g/dL — ABNORMAL LOW (ref 12.0–15.0)
MCH: 31.7 pg (ref 26.0–34.0)
MCHC: 33.3 g/dL (ref 30.0–36.0)
MCV: 95 fL (ref 78.0–100.0)
Platelets: 49 10*3/uL — ABNORMAL LOW (ref 150–400)
RBC: 3.03 MIL/uL — AB (ref 3.87–5.11)
RDW: 16.2 % — ABNORMAL HIGH (ref 11.5–15.5)
WBC: 4.2 10*3/uL (ref 4.0–10.5)

## 2013-11-10 LAB — COMPREHENSIVE METABOLIC PANEL
ALT: 812 U/L — ABNORMAL HIGH (ref 0–35)
AST: 301 U/L — ABNORMAL HIGH (ref 0–37)
Albumin: 2.4 g/dL — ABNORMAL LOW (ref 3.5–5.2)
Alkaline Phosphatase: 133 U/L — ABNORMAL HIGH (ref 39–117)
Anion gap: 15 (ref 5–15)
BILIRUBIN TOTAL: 1.4 mg/dL — AB (ref 0.3–1.2)
BUN: 26 mg/dL — AB (ref 6–23)
CALCIUM: 8.8 mg/dL (ref 8.4–10.5)
CHLORIDE: 109 meq/L (ref 96–112)
CO2: 15 mEq/L — ABNORMAL LOW (ref 19–32)
CREATININE: 1.18 mg/dL — AB (ref 0.50–1.10)
GFR, EST AFRICAN AMERICAN: 51 mL/min — AB (ref >90)
GFR, EST NON AFRICAN AMERICAN: 44 mL/min — AB (ref >90)
GLUCOSE: 170 mg/dL — AB (ref 70–99)
Potassium: 3.9 mEq/L (ref 3.7–5.3)
Sodium: 139 mEq/L (ref 137–147)
Total Protein: 5.8 g/dL — ABNORMAL LOW (ref 6.0–8.3)

## 2013-11-10 LAB — PROTIME-INR
INR: 2.85 — ABNORMAL HIGH (ref 0.00–1.49)
Prothrombin Time: 29.9 seconds — ABNORMAL HIGH (ref 11.6–15.2)

## 2013-11-10 MED ORDER — RAMIPRIL 2.5 MG PO CAPS
2.5000 mg | ORAL_CAPSULE | Freq: Every day | ORAL | Status: DC
  Administered 2013-11-10 – 2013-11-13 (×4): 2.5 mg via ORAL
  Filled 2013-11-10 (×4): qty 1

## 2013-11-10 MED ORDER — DEXTROSE-NACL 5-0.45 % IV SOLN: INTRAVENOUS | Status: DC

## 2013-11-10 MED ORDER — DILTIAZEM HCL 30 MG PO TABS
30.0000 mg | ORAL_TABLET | Freq: Four times a day (QID) | ORAL | Status: DC
  Administered 2013-11-10 – 2013-11-13 (×13): 30 mg via ORAL
  Filled 2013-11-10 (×17): qty 1

## 2013-11-10 MED ORDER — HYDROCORTISONE NA SUCCINATE PF 100 MG IJ SOLR
100.0000 mg | Freq: Two times a day (BID) | INTRAMUSCULAR | Status: DC
  Administered 2013-11-10 – 2013-11-11 (×2): 100 mg via INTRAVENOUS
  Filled 2013-11-10 (×4): qty 2

## 2013-11-10 MED ORDER — WARFARIN SODIUM 2 MG PO TABS
2.0000 mg | ORAL_TABLET | Freq: Once | ORAL | Status: AC
Start: 2013-11-10 — End: 2013-11-10
  Administered 2013-11-10: 2 mg via ORAL
  Filled 2013-11-10: qty 1

## 2013-11-10 MED ORDER — AMIODARONE HCL 200 MG PO TABS
200.0000 mg | ORAL_TABLET | Freq: Every day | ORAL | Status: DC
  Administered 2013-11-10 – 2013-11-13 (×4): 200 mg via ORAL
  Filled 2013-11-10 (×4): qty 1

## 2013-11-10 NOTE — Progress Notes (Addendum)
[facility] for Infectious Disease  Date of Admission:  11/07/2013  Antibiotics: Home amoxicillin  Subjective: No complaints today  Objective: Temp:  [97.3 F (36.3 C)-97.8 F (36.6 C)] 97.4 F (36.3 C) (08/18 0725) Pulse Rate:  [73-104] 98 (08/18 0401) Resp:  [18-28] 18 (08/18 0401) BP: (115-129)/(56-68) 120/67 mmHg (08/18 0401) SpO2:  [93 %-98 %] 98 % (08/18 0401) Weight:  [171 lb 11.8 oz (77.9 kg)] 171 lb 11.8 oz (77.9 kg) (08/18 0506)  General: awake, alert, nad Skin: no rashes Lungs: CTA B, no respiratory distress Cor: irr irr with rate around 85-95 Abdomen: Ext: no edema  Lab Results Lab Results  Component Value Date   WBC 4.2 11/10/2013   HGB 9.6* 11/10/2013   HCT 28.8* 11/10/2013   MCV 95.0 11/10/2013   PLT 49* 11/10/2013    Lab Results  Component Value Date   CREATININE 1.18* 11/10/2013   BUN 26* 11/10/2013   NA 139 11/10/2013   K 3.9 11/10/2013   CL 109 11/10/2013   CO2 15* 11/10/2013    Lab Results  Component Value Date   ALT 812* 11/10/2013   AST 301* 11/10/2013   ALKPHOS 133* 11/10/2013   BILITOT 1.4* 11/10/2013      Microbiology: Recent Results (from the past 240 hour(s))  URINE CULTURE     Status: None   Collection Time    11/07/13  7:56 AM      Result Value Ref Range Status   Specimen Description URINE, CATHETERIZED   Final   Special Requests NONE   Final   Culture  Setup Time     Final   Value: 11/07/2013 18:34     Performed at [facility]     Final   Value: NO GROWTH     Performed at [facility]   Culture     Final   Value: NO GROWTH     Performed at [facility]   Report Status 11/08/2013 FINAL   Final  CULTURE, BLOOD (ROUTINE X 2)     Status: None   Collection Time    11/07/13  8:20 AM      Result Value Ref Range Status   Specimen Description BLOOD LEFT ARM   Final   Special Requests BOTTLES DRAWN AEROBIC ONLY 4CC   Final   Culture  Setup Time     Final   Value: 11/07/2013 18:00   Performed at [facility]   Culture     Final   Value:        BLOOD CULTURE RECEIVED NO GROWTH TO DATE CULTURE WILL BE HELD FOR 5 DAYS BEFORE ISSUING A FINAL NEGATIVE REPORT     Performed at [facility]   Report Status PENDING   Incomplete  CULTURE, BLOOD (ROUTINE X 2)     Status: None   Collection Time    11/07/13  8:27 AM      Result Value Ref Range Status   Specimen Description BLOOD LEFT HAND   Final   Special Requests BOTTLES DRAWN AEROBIC ONLY 3CC   Final   Culture  Setup Time     Final   Value: 11/07/2013 18:00     Performed at [facility]   Culture     Final   Value:        BLOOD CULTURE RECEIVED NO GROWTH TO DATE CULTURE WILL BE HELD FOR 5 DAYS BEFORE ISSUING A FINAL NEGATIVE REPORT  Performed at [facility]   Report Status PENDING   Incomplete  MRSA PCR SCREENING     Status: None   Collection Time    11/07/13  2:09 PM      Result Value Ref Range Status   MRSA by PCR NEGATIVE  NEGATIVE Final   Comment:            The GeneXpert MRSA Assay (FDA     approved for NASAL specimens     only), is one component of a     comprehensive MRSA colonization     surveillance program. It is not     intended to diagnose MRSA     infection nor to guide or     monitor treatment for     MRSA infections.    Studies/Results: No results found.  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   -have resumed daily amoxicillin therapy.   2) Afib - has not converted.    3) transaminitis - improved again today.    I will sign off, she has an appt with Dr. Tommy Medal already scheduled 9/18 and she can keep that.  thanks   [name], [facility] for Infectious Disease [place] [email or web address] [phone] pager   [phone] cell 11/10/2013, 10:16 AM

## 2013-11-10 NOTE — Progress Notes (Signed)
Subjective:  Patient denies any chest pain or shortness of breath. Remains in A. fib with moderate ventricular response  Objective:  Vital Signs in the last 24 hours: Temp:  [97.3 F (36.3 C)-97.8 F (36.6 C)] 97.4 F (36.3 C) (08/18 0725) Pulse Rate:  [73-104] 98 (08/18 0401) Resp:  [18-28] 18 (08/18 0401) BP: (115-129)/(56-68) 120/67 mmHg (08/18 0401) SpO2:  [93 %-98 %] 98 % (08/18 0401) Weight:  [77.9 kg (171 lb 11.8 oz)] 77.9 kg (171 lb 11.8 oz) (08/18 0506)  Intake/Output from previous day: 08/17 0701 - 08/18 0700 In: 430 [P.O.:360; I.V.:70] Out: 1200 [Urine:1200] Intake/Output from this shift:    Physical Exam: Neck: no adenopathy, no carotid bruit, no JVD and supple, symmetrical, trachea midline Lungs: Clear to auscultation anterolaterally Heart: irregularly irregular rhythm, S1, S2 normal and 2/6 systolic murmur noted Abdomen: soft, non-tender; bowel sounds normal; no masses,  no organomegaly Extremities: extremities normal, atraumatic, no cyanosis or edema  Lab Results:  Recent Labs  11/09/13 0947 11/10/13 0234  WBC 6.6 4.2  HGB 10.7* 9.6*  PLT 60* 49*    Recent Labs  11/09/13 0947 11/10/13 0234  NA 137 139  K 4.1 3.9  CL 106 109  CO2 16* 15*  GLUCOSE 171* 170*  BUN 27* 26*  CREATININE 1.36* 1.18*   No results found for this basename: TROPONINI, CK, MB,  in the last 72 hours Hepatic Function Panel  Recent Labs  11/10/13 0234  PROT 5.8*  ALBUMIN 2.4*  AST 301*  ALT 812*  ALKPHOS 133*  BILITOT 1.4*   No results found for this basename: CHOL,  in the last 72 hours No results found for this basename: PROTIME,  in the last 72 hours  Imaging: Imaging results have been reviewed and No results found.  Cardiac Studies:  Assessment/Plan:  Status post A. 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 History switching Cardizem to by mouth Adjust Coumadin dose while on amiodarone per pharmacy  LOS: 3 days    [name] N 11/10/2013, 11:04 AM

## 2013-11-10 NOTE — Progress Notes (Signed)
ANTICOAGULATION CONSULT NOTE - Follow Up Consult  Pharmacy Consult for warfarin Indication: atrial fibrillation  Allergies  Allergen Reactions  . Ceftriaxone     RASH BUT TOLERATED AMPICILLIN AND IMIPENEM WITHOUT PROBLEMS  . Codeine Nausea Only    Patient Measurements: Height: 5\' 7"  (170.2 cm) Weight: 171 lb 11.8 oz (77.9 kg) IBW/kg (Calculated) : 61.6  Vital Signs: Temp: 97.4 F (36.3 C) (08/18 0725) Temp src: Oral (08/18 0725) BP: 120/67 mmHg (08/18 0401) Pulse Rate: 98 (08/18 0401)  Labs:  Recent Labs  11/08/13 0355 11/09/13 0315 11/09/13 0947 11/10/13 0234  HGB 10.2*  --  10.7* 9.6*  HCT 31.3*  --  32.4* 28.8*  PLT 59*  --  60* 49*  LABPROT 32.3* 31.0*  --  29.9*  INR 3.14* 2.98*  --  2.85*  CREATININE 1.89*  --  1.36* 1.18*    Estimated Creatinine Clearance: 44.3 ml/min (by C-G formula based on Cr of 1.18).  Assessment: 54 yoF presents with generalized weakness, vomiting, and increased confusion. She continues on coumadin for hx of afib. INR remains therapeutic at 2.85. Noted positive FOB however MD noted external hemorrhoids. Platelets are also very low at 49 but this is a chronic issue. Coumadin to continue. Also LFTs are significantly elevated but trending down.   Goal of Therapy:  INR 2-3   Plan:  1. Warfarin 2mg  PO x 1 tonight 2. F/u AM INR  Salome Arnt, PharmD, BCPS Pager # [phone] 11/10/2013 8:15 AM

## 2013-11-10 NOTE — Progress Notes (Signed)
Note/chart reviewed.  Katie Marianne Golightly, RD, LDN Pager #: [phone] After-Hours Pager #: [phone]  

## 2013-11-10 NOTE — Care Management Note (Addendum)
  Page 2 of 2   11/13/2013     5:32:26 PM CARE MANAGEMENT NOTE 11/13/2013  Patient:  [name],[name]   Account Number:  [id number]  Date Initiated:  11/09/2013  Documentation initiated by:  Marvetta Gibbons  Subjective/Objective Assessment:   Pt admitted with resp. distress - admitted on BIPAP     Action/Plan:   PTA pt lived at home with daughter- PT eval ordered   Anticipated DC Date:  11/13/2013   Anticipated DC Plan:        [place]  CM consult      [facility]  HOME HEALTH   Choice offered to / List presented to:  [name]        [place] arranged  HH-1 RN  [place]      [place].   Status of service:  Completed, signed off Medicare Important Message given?  YES (If response is "NO", the following Medicare IM given date fields will be blank) Date Medicare IM given:  11/10/2013 Medicare IM given by:  Marvetta Gibbons Date Additional Medicare IM given:  11/13/2013 Additional Medicare IM given by:  [place]  Discharge Disposition:    Per UR Regulation:  Reviewed for med. necessity/level of care/duration of stay  If discussed at [place] of Stay Meetings, dates discussed:    Comments:  George Haggart RN, BSN, MSHL, CCM  Nurse - Case Manager,  ([facility] [place])  [phone]  11/13/2013 Social:  From home THN:  Initiated for COPD MGMT Home DME:  Home Oxygen HHS:  RN, PT services ordered with ([place] / Butch Penny notified)   11/10/13- 1500- Marvetta Gibbons RN, BSN [phone] Spoke with pt at bedside- per conversation pt states that she has had [place] many times in the past and in fact has recently finished [place] therapy at home- pt states that she does not feel like she would benefit from any further [place] therapy at this time- she wears home 02 at night (DME through [facility]) she also reports that she has all needed DME at home that includes walker, canes, BSC, shower chairs, ect. - explained that CM would continue to follow for  any d/c needs that should arise or if she changed her mind about any recommendations regarding HH.

## 2013-11-10 NOTE — Evaluation (Signed)
Physical Therapy Evaluation Patient Details Name: [name] MRN: [id number] DOB: 11-Jul-1940 Today's Date: 11/10/2013   History of Present Illness  Patient is a 73 yo female with complex medical history. Adtmitted on biPap with respiratory distress. Patient with recent admission on 6/20 with h/o diarrhea x 2 months with progressive weakness. Pt was unable to walk on admission. PMHx- lupus, AVR with endocarditis, home O2 (daughter reports she only uses at night), pulmonary fibrosis, arthritis, CHF.            Clinical Impression  Patient demonstrates deficits in functional mobility as indicated below. Will need continued skilled PT to address deficits and maximize function. Will see as indicated and progress as tolerated. Recommend HHPT given patients current limitations and fall history however patient reluctant at this time.  OF NOTE: Patient with elevated HR 158 and sustained in 150s for 2 minutes with activity. Nsg aware    Follow Up Recommendations Home health PT;Supervision/Assistance - 24 hour    Equipment Recommendations  None recommended by PT    Recommendations for Other Services       Precautions / Restrictions Precautions Precautions: Fall Restrictions Weight Bearing Restrictions: No      Mobility  Bed Mobility Overal bed mobility: Needs Assistance Bed Mobility: Supine to Sit     Supine to sit: Max assist        Transfers Overall transfer level: Needs assistance Equipment used: Rolling walker (2 wheeled) Transfers: Sit to/from [facility] Sit to Stand: Mod assist;+2 physical assistance Stand pivot transfers: Mod assist;+2 physical assistance       General transfer comment: Patient with significant posterior lean upon coming to standing, performed x3 requiring assist to elevate and power up, then continued assist for stability. Pt assisted for pivot to chair. Patient with HR elevated to 158 with activity  Ambulation/Gait                Stairs            Wheelchair Mobility    Modified Rankin (Stroke Patients Only)       Balance Overall balance assessment: Needs assistance;History of Falls Sitting-balance support: Feet supported Sitting balance-Leahy Scale: Fair     Standing balance support: Bilateral upper extremity supported;During functional activity Standing balance-Leahy Scale: Poor Standing balance comment: significant posterior lean                             Pertinent Vitals/Pain Pain Assessment: No/denies pain    Home Living Family/patient expects to be discharged to:: Private residence Living Arrangements: Children Available Help at Discharge: Family;Available 24 hours/day Type of Home: Apartment Home Access: Ramped entrance     Home Layout: One level Home Equipment: Walker - 2 wheels;Bedside commode;Wheelchair - manual;Shower seat - built in;Grab bars - toilet;Grab bars - tub/shower Additional Comments: family to assist at d/c    Prior Function Level of Independence: Needs assistance               Hand Dominance   Dominant Hand: Right    Extremity/Trunk Assessment   Upper Extremity Assessment: Generalized weakness           Lower Extremity Assessment: Generalized weakness (circulatory issues BLEs)         Communication   Communication: No difficulties  Cognition Arousal/Alertness: Awake/alert Behavior During Therapy: Flat affect Overall Cognitive Status: Within Functional Limits for tasks assessed  General Comments General comments (skin integrity, edema, etc.): BLEs turned red to blue in color during dependent positioning with shiny skin, nsg aware, patient reports this is normal for her    Exercises        Assessment/Plan    PT Assessment Patient needs continued PT services  PT Diagnosis Difficulty walking;Abnormality of gait;Generalized weakness   PT Problem List Decreased strength;Decreased activity  tolerance;Decreased balance;Decreased mobility;Decreased safety awareness;Cardiopulmonary status limiting activity  PT Treatment Interventions DME instruction;Gait training;Stair training;Functional mobility training;Therapeutic activities;Therapeutic exercise;Balance training;Patient/family education   PT Goals (Current goals can be found in the Care Plan section) Acute Rehab PT Goals Patient Stated Goal: to go home PT Goal Formulation: With patient Time For Goal Achievement: 11/24/13 Potential to Achieve Goals: Good    Frequency Min 4X/week   Barriers to discharge        Co-evaluation               End of Session Equipment Utilized During Treatment: Oxygen;Gait belt Activity Tolerance: Treatment limited secondary to medical complications (Comment) (HR elevated to 158 with activty, remained 150s 30mins) Patient left: in chair;with call bell/phone within reach Nurse Communication: Mobility status         Time: 2952-8413 PT Time Calculation (min): 20 min   Charges:   PT Evaluation $Initial PT Evaluation Tier I: 1 Procedure PT Treatments $Therapeutic Activity: 8-22 mins   PT G CodesDuncan Dull 11/10/2013, 4:08 PM Alben Deeds, [place] DPT  [phone]

## 2013-11-10 NOTE — Progress Notes (Signed)
Utilization review completed.  

## 2013-11-10 NOTE — Progress Notes (Signed)
Pt stated she does not feel as though she needs Bipap at this time. No distress noted. Vitals WNL. Bipap at bedside if needed.

## 2013-11-10 NOTE — Progress Notes (Addendum)
[facility]                                                                              Patient Demographics  [name], is a 73 y.o. female, DOB - [birth year], ZOX:[id number]  Admit date - 11/07/2013   Admitting Physician Cristal Ford, DO  Outpatient Primary MD for the patient is REED, TIFFANY, DO  LOS - 3   Chief Complaint  Patient presents with  . Tachycardia      HPI on 11/07/2013 [name] is a 73 y.o. 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 Patient resides with her daughter who was at bedside, during admission. Patient's daughter stated that she was complaining of weakness and fatigue for the past several days and has had a poor appetite. Daughter stated that she has been somewhat confused and very sleepy. She had 4 episodes of vomiting, nonbilious and nonbloody. Patient's daughter stated she had labs done at Dr. Zenia Resides office this past Wednesday and were apparently were normal. Patient required BiPAP while in the emergency room due to respiratory acidosis and tachypnea. She was able to answer yes and no questions. Patient denied any chest pain, abdominal pain, further nausea and vomiting, headache, dizziness. Per daughter, no recent travel or ill contacts. While in the emergency department, patient was noted to have atrial fibrillation with RVR, and started on diltiazem drip  Interim history Patient was started on Solucortef, appears to be improving.  Will start to wean today.  Patient still on dilt drip, will switch to PO today.  Pending cardiology followup.  If remains stable, once switched to PO dilt, may likely be transferred to the floor.  May need a few additional days to wean off of steroids.  AKI and transaminases improving.  Currently on ampicillin for prophylaxis of recurrent endocarditis.  Assessment & Plan   Severe Sepsis with lactic acidosis  -Secondary to unknown source at this time. Upon admission, patient does have a slight leukocytosis, and tachycardic as well as tachycardic. -Leukocytosis resolved, tachycardia resolved, afebrile  -Could possibly be secondary to viral gastroenteritis versus recurrent endocarditis  -Chest x-ray negative for acute cardiopulmonary process, UA negative for infection -Blood cultures show no growth to date -Continue broad-spectrum antibiotics with daptomyocin pharmacy to dose and follow  -Patient currently has no diarrhea or complaints of abdominal pain  -Infectious disease, Dr. Linus Salmons, consulted and appreciated -repeat lactic acid improved, 2.6 -possibly related to underlying lung disease -Patient improving.  No longer requiring BiPAP  Acute hypoxic respiratory failure  -Possibly secondary to exac of underlying fibrosis -Improved -Initially required BIPAP, transitioned to [place] -Patient does not wish to be intubated  -Possibly secondary to patient's A. fib RVR versus sepsis  -Will decrease steroids  Atrial fibrillation with RVR  -Possibly secondary to acute resp failure -Continue diltiazem drip, rate improving  -Will switch to PO dilt, still pending recommendations from cardiology -Cardiology consulted and appreciated  -TSH 2.17, Magnesium 2.2, phos 4.4 -Continue Coumadin per pharmacy   Acute kidney injury on CAD stage III  -  Creatinine currently 1.8, baseline usually less than 1  -Likely secondary to hypoperfusion from acute resp failure/sepsis -Continue gentle IV fluids and continue to monitor   Hyperkalemia -Resolved -Patient was given calcium gluconate 1 insulin and D50 and Kayexalate in the emergency department -Continue to monitor BMP  Elevated transaminases  -Likely secondary to sepsis/shock liver from hypoperfusion secondary to Afib RVR -Trending  downward -Abdominal ultrasound: no acute abnormality seen within the abdomen -Continue to monitor LFTs  Nausea and vomiting  -Resolved, Likely related to sepsis versus gastroenteritis  -Will continue antiemetic PRN  History of lupus  -Continue plaquenil  -Will begin to taper hydrocortisone -PM cortisol WNL  Hyperlipidemia  -Continue statin   Chronic diastolic heart failure  -Will hold diuretics at this time due to patient's sepsis  -Currently compensated  -Will continue to monitor her daily weights and strict intake and output   Recurrent enterococcal endocarditis  -Ampicillin restarted on 8/17 -Discontinued daptomycin and Zosyn as blood cultures are negative to date -Infectious disease consulted and appreciated  Normocytic Anemia -Fecal occult was positive, however, patient on coumadin and does have external hemorrhoids, will continue to monitor CBC -Anemia panel: Iron 86, Ferritin 3177 (likely acute phase reactant)  Code Status: DNR/DNI  Family Communication: None at bedside  Disposition Plan: Admitted  Time Spent in minutes   30 minutes  Procedures  None  Consults   Cardiology Infectious Disease  DVT Prophylaxis  Coumadin  Lab Results  Component Value Date   PLT 49* 11/10/2013    Medications  Scheduled Meds: . amoxicillin  500 mg Oral Q12H  . antiseptic oral rinse  7 mL Mouth Rinse BID  . buprenorphine  5 mcg Transdermal Weekly  . gabapentin  300 mg Oral QHS  . hydrocortisone sod succinate (SOLU-CORTEF) inj  100 mg Intravenous Q8H  . hydroxychloroquine  200 mg Oral BID  . pantoprazole (PROTONIX) IV  40 mg Intravenous Q24H  . saccharomyces boulardii  250 mg Oral BID  . Warfarin - Pharmacist Dosing Inpatient   Does not apply q1800   Continuous Infusions: . dextrose 5 % and 0.45% NaCl 75 mL/hr at 11/10/13 0600  . dextrose 5 % and 0.9% NaCl 75 mL/hr at 11/07/13 0850   PRN Meds:.acetaminophen, acetaminophen, dextrose, ondansetron (ZOFRAN) IV,  ondansetron  Antibiotics    Anti-infectives   Start     Dose/Rate Route Frequency Ordered Stop   11/09/13 1330  amoxicillin (AMOXIL) capsule 500 mg     500 mg Oral Every 12 hours 11/09/13 1245     11/07/13 1400  DAPTOmycin (CUBICIN) 500 mg in sodium chloride 0.9 % IVPB  Status:  Discontinued     500 mg 220 mL/hr over 30 Minutes Intravenous Every 48 hours 11/07/13 1352 11/09/13 1202   11/07/13 1400  piperacillin-tazobactam (ZOSYN) IVPB 3.375 g  Status:  Discontinued     3.375 g 12.5 mL/hr over 240 Minutes Intravenous 3 times per day 11/07/13 1352 11/09/13 1202   11/07/13 1330  hydroxychloroquine (PLAQUENIL) tablet 200 mg     200 mg Oral 2 times daily 11/07/13 1237     11/07/13 0830  vancomycin (VANCOCIN) IVPB 1000 mg/200 mL premix     1,000 mg 200 mL/hr over 60 Minutes Intravenous  Once 11/07/13 0824 11/07/13 1021   11/07/13 0830  piperacillin-tazobactam (ZOSYN) IVPB 3.375 g     3.375 g 100 mL/hr over 30 Minutes Intravenous  Once 11/07/13 0824 11/07/13 1026        Subjective:   [name] seen  and examined today.  Patient states she is feeling much better today.  She did not need BiPAP overnight.  No complaints of chest pain abdominal pain, nausea, vomiting, diarrhea at this time.  Objective:   Filed Vitals:   11/09/13 2239 11/10/13 0020 11/10/13 0401 11/10/13 0506  BP: 115/61 118/56 120/67   Pulse: 94 104 98   Temp: 97.7 F (36.5 C)  97.3 F (36.3 C)   TempSrc: Oral  Oral   Resp: 28 26 18    Height:      Weight:    77.9 kg (171 lb 11.8 oz)  SpO2: 98% 98% 98%     Wt Readings from Last 3 Encounters:  11/10/13 77.9 kg (171 lb 11.8 oz)  09/28/13 68.04 kg (150 lb)  09/15/13 60.873 kg (134 lb 3.2 oz)     Intake/Output Summary (Last 24 hours) at 11/10/13 8338 Last data filed at 11/10/13 0300  Gross per 24 hour  Intake    360 ml  Output   1000 ml  Net   -640 ml    Exam General: Well developed, well nourished, appears stated age  28: NCAT, mucous membranes  warm Cardiovascular: S1 S2 auscultated,+3/6 SEM, Irregular, tachycardic  Respiratory: Clear to auscultation bilaterally with equal chest rise  Abdomen: Soft, nontender, nondistended, + bowel sounds  Extremities: dry without cyanosis clubbing or edema, feet cool to touch and cyanotic- but good pulses. RUE edema (from infiltrated IV)  Neuro: AAOx3, no focal deficits   Data Review   Micro Results Recent Results (from the past 240 hour(s))  URINE CULTURE     Status: None   Collection Time    11/07/13  7:56 AM      Result Value Ref Range Status   Specimen Description URINE, CATHETERIZED   Final   Special Requests NONE   Final   Culture  Setup Time     Final   Value: 11/07/2013 18:34     Performed at [facility]     Final   Value: NO GROWTH     Performed at [facility]   Culture     Final   Value: NO GROWTH     Performed at [facility]   Report Status 11/08/2013 FINAL   Final  CULTURE, BLOOD (ROUTINE X 2)     Status: None   Collection Time    11/07/13  8:20 AM      Result Value Ref Range Status   Specimen Description BLOOD LEFT ARM   Final   Special Requests BOTTLES DRAWN AEROBIC ONLY 4CC   Final   Culture  Setup Time     Final   Value: 11/07/2013 18:00     Performed at [facility]   Culture     Final   Value:        BLOOD CULTURE RECEIVED NO GROWTH TO DATE CULTURE WILL BE HELD FOR 5 DAYS BEFORE ISSUING A FINAL NEGATIVE REPORT     Performed at [facility]   Report Status PENDING   Incomplete  CULTURE, BLOOD (ROUTINE X 2)     Status: None   Collection Time    11/07/13  8:27 AM      Result Value Ref Range Status   Specimen Description BLOOD LEFT HAND   Final   Special Requests BOTTLES DRAWN AEROBIC ONLY 3CC   Final   Culture  Setup Time     Final   Value: 11/07/2013 18:00  Performed at [facility]     Final   Value:        BLOOD CULTURE RECEIVED NO GROWTH TO DATE CULTURE WILL BE HELD FOR 5 DAYS  BEFORE ISSUING A FINAL NEGATIVE REPORT     Performed at [facility]   Report Status PENDING   Incomplete  MRSA PCR SCREENING     Status: None   Collection Time    11/07/13  2:09 PM      Result Value Ref Range Status   MRSA by PCR NEGATIVE  NEGATIVE Final   Comment:            The GeneXpert MRSA Assay (FDA     approved for NASAL specimens     only), is one component of a     comprehensive MRSA colonization     surveillance program. It is not     intended to diagnose MRSA     infection nor to guide or     monitor treatment for     MRSA infections.    Radiology Reports US Abdomen Complete  11/07/2013   CLINICAL DATA:  Severe sepsis.  Elevated LFTs.  EXAM: ULTRASOUND ABDOMEN COMPLETE  COMPARISON:  CT abdomen and pelvis from 08/03/2013, and renal ultrasound performed 02/13/2012  FINDINGS: Gallbladder:  Contracted and not well assessed. A 2.0 cm stone is noted within the gallbladder. This appearance is grossly unchanged from the prior CT. No ultrasonographic Murphy's sign is elicited.  Common bile duct:  Diameter: 0.4 cm, within normal limits in caliber.  Liver:  No focal lesion identified. Diffuse soft tissue thickening about the porta hepatis is thought to reflect periportal edema, also noted on prior CT. Within normal limits in parenchymal echogenicity. Trace ascites is noted about the liver.  IVC:  Not fully characterized, but grossly unremarkable in appearance.  Pancreas:  The visualized portions of the pancreas are grossly unremarkable, though the pancreas is difficult to fully assess.  Spleen:  Size and appearance within normal limits.  Right Kidney:  Length: 13.1 cm. Echogenicity within normal limits. Trace perinephric fluid is noted. No hydronephrosis visualized. A 2.3 cm cyst is noted at the lower pole of the right kidney.  Left Kidney:  Length: 12.7 cm. Echogenicity within normal limits. Trace perinephric fluid is noted. No hydronephrosis visualized. A 0.9 cm cyst is noted at the  upper pole of the left kidney.  Abdominal aorta:  No aneurysm visualized. Not fully characterized due to overlying bowel gas.  Other findings:  None.  IMPRESSION: 1. No acute abnormality seen within the abdomen. Evaluation is mildly suboptimal due to bowel gas. 2. Gallbladder contracted and not well assessed, with a 2.0 cm stone in the gallbladder. This appearance is stable from the prior CT. No ultrasonographic Murphy's sign elicited. No evidence for acute cholecystitis or obstruction. 3. Periportal edema again noted. 4. Trace perinephric fluid noted bilaterally, mildly more prominent than on the prior CT and of uncertain significance. Small bilateral renal cysts seen. 5. Trace ascites noted about the liver.   Electronically Signed   By: Garald Balding M.D.   On: 11/07/2013 21:23   Dg Chest Portable 1 View  11/07/2013   CLINICAL DATA:  Generalized weakness  EXAM: PORTABLE CHEST - 1 VIEW  COMPARISON:  09/12/2013  FINDINGS: Stable changes from previous cardiac surgery. Cardiac silhouette is enlarged. No mediastinal or hilar masses.  Mild stable interstitial thickening. Lungs are otherwise clear. No pleural effusion or pneumothorax.  Bony thorax  is demineralized but grossly intact.  IMPRESSION: No acute cardiopulmonary disease.   Electronically Signed   By: Lajean Manes M.D.   On: 11/07/2013 09:29    CBC  Recent Labs Lab 11/07/13 0735 11/07/13 0752 11/08/13 0355 11/09/13 0947 11/10/13 0234  WBC 11.6*  --  9.2 6.6 4.2  HGB 13.1 15.0 10.2* 10.7* 9.6*  HCT 40.9 44.0 31.3* 32.4* 28.8*  PLT 100*  --  59* 60* 49*  MCV 99.8  --  97.5 95.6 95.0  MCH 32.0  --  31.8 31.6 31.7  MCHC 32.0  --  32.6 33.0 33.3  RDW 16.8*  --  16.5* 16.1* 16.2*  LYMPHSABS 2.9  --   --   --   --   MONOABS 1.6*  --   --   --   --   EOSABS 0.1  --   --   --   --   BASOSABS 0.1  --   --   --   --     Chemistries   Recent Labs Lab 11/07/13 0735 11/07/13 0752 11/07/13 1340 11/08/13 0355 11/09/13 0947 11/10/13 0234    NA 142 140  --  135* 137 139  K 6.4* 5.9*  --  5.9* 4.1 3.9  CL 106 117*  --  106 106 109  CO2 10*  --   --  14* 16* 15*  GLUCOSE 32* 30*  --  109* 171* 170*  BUN 25* 32*  --  32* 27* 26*  CREATININE 1.65* 1.80*  --  1.89* 1.36* 1.18*  CALCIUM 10.1  --   --  8.9 8.7 8.8  MG  --   --  2.2  --   --   --   AST 244*  --   --  1870* 562* 301*  ALT 179*  --   --  1428* 1006* 812*  ALKPHOS 170*  --   --  124* 119* 133*  BILITOT 2.7*  --   --  2.1* 2.1* 1.4*   ------------------------------------------------------------------------------------------------------------------ estimated creatinine clearance is 44.3 ml/min (by C-G formula based on Cr of 1.18). ------------------------------------------------------------------------------------------------------------------ No results found for this basename: HGBA1C,  in the last 72 hours ------------------------------------------------------------------------------------------------------------------ No results found for this basename: CHOL, HDL, LDLCALC, TRIG, CHOLHDL, LDLDIRECT,  in the last 72 hours ------------------------------------------------------------------------------------------------------------------  Recent Labs  11/07/13 1340  TSH 2.170   ------------------------------------------------------------------------------------------------------------------  Recent Labs  11/08/13 1140  VITAMINB12 1735*  FOLATE >20.0  FERRITIN 3177*  TIBC 242*  IRON 86  RETICCTPCT 3.1    Coagulation profile  Recent Labs Lab 11/07/13 0735 11/08/13 0355 11/09/13 0315 11/10/13 0234  INR 1.96* 3.14* 2.98* 2.85*    No results found for this basename: DDIMER,  in the last 72 hours  Cardiac Enzymes No results found for this basename: CK, CKMB, TROPONINI, MYOGLOBIN,  in the last 168 hours ------------------------------------------------------------------------------------------------------------------ No components found with this  basename: POCBNP,     Jenese Mischke D.O. on 11/10/2013 at 7:18 AM  Between 7am to 7pm - Pager - [phone]  After 7pm go to [email or web address] - password [email or web address]  And look for the night coverage person covering for me after hours  [facility] Office  [phone]

## 2013-11-11 LAB — COMPREHENSIVE METABOLIC PANEL
ALBUMIN: 2.6 g/dL — AB (ref 3.5–5.2)
ALK PHOS: 146 U/L — AB (ref 39–117)
ALT: 603 U/L — ABNORMAL HIGH (ref 0–35)
AST: 142 U/L — ABNORMAL HIGH (ref 0–37)
Anion gap: 14 (ref 5–15)
BUN: 29 mg/dL — AB (ref 6–23)
CALCIUM: 9.3 mg/dL (ref 8.4–10.5)
CO2: 17 mEq/L — ABNORMAL LOW (ref 19–32)
Chloride: 107 mEq/L (ref 96–112)
Creatinine, Ser: 1.09 mg/dL (ref 0.50–1.10)
GFR calc Af Amer: 56 mL/min — ABNORMAL LOW (ref >90)
GFR calc non Af Amer: 48 mL/min — ABNORMAL LOW (ref >90)
Glucose, Bld: 145 mg/dL — ABNORMAL HIGH (ref 70–99)
POTASSIUM: 3.9 meq/L (ref 3.7–5.3)
SODIUM: 138 meq/L (ref 137–147)
Total Bilirubin: 1.2 mg/dL (ref 0.3–1.2)
Total Protein: 6.1 g/dL (ref 6.0–8.3)

## 2013-11-11 LAB — PROTIME-INR
INR: 2.42 — AB (ref 0.00–1.49)
PROTHROMBIN TIME: 26.3 s — AB (ref 11.6–15.2)

## 2013-11-11 LAB — CBC
HCT: 31.2 % — ABNORMAL LOW (ref 36.0–46.0)
Hemoglobin: 10.6 g/dL — ABNORMAL LOW (ref 12.0–15.0)
MCH: 32.5 pg (ref 26.0–34.0)
MCHC: 34 g/dL (ref 30.0–36.0)
MCV: 95.7 fL (ref 78.0–100.0)
Platelets: 62 10*3/uL — ABNORMAL LOW (ref 150–400)
RBC: 3.26 MIL/uL — ABNORMAL LOW (ref 3.87–5.11)
RDW: 16.4 % — AB (ref 11.5–15.5)
WBC: 5.7 10*3/uL (ref 4.0–10.5)

## 2013-11-11 MED ORDER — SENNA 8.6 MG PO TABS
2.0000 | ORAL_TABLET | Freq: Every day | ORAL | Status: DC | PRN
  Administered 2013-11-12: 17.2 mg via ORAL
  Filled 2013-11-11: qty 2

## 2013-11-11 MED ORDER — HYDROCORTISONE NA SUCCINATE PF 100 MG IJ SOLR
50.0000 mg | Freq: Two times a day (BID) | INTRAMUSCULAR | Status: DC
  Administered 2013-11-11 – 2013-11-13 (×4): 50 mg via INTRAVENOUS
  Filled 2013-11-11 (×6): qty 1

## 2013-11-11 MED ORDER — WARFARIN SODIUM 2 MG PO TABS
2.0000 mg | ORAL_TABLET | Freq: Once | ORAL | Status: AC
  Administered 2013-11-11: 2 mg via ORAL
  Filled 2013-11-11: qty 1

## 2013-11-11 MED ORDER — PANTOPRAZOLE SODIUM 40 MG PO TBEC
40.0000 mg | DELAYED_RELEASE_TABLET | Freq: Every day | ORAL | Status: DC
  Administered 2013-11-12 – 2013-11-13 (×2): 40 mg via ORAL
  Filled 2013-11-11 (×2): qty 1

## 2013-11-11 MED ORDER — ACETAMINOPHEN 500 MG PO TABS: 500.0000 mg | ORAL_TABLET | Freq: Four times a day (QID) | ORAL | Status: DC | PRN

## 2013-11-11 MED ORDER — ACETAMINOPHEN 650 MG RE SUPP: 325.0000 mg | Freq: Four times a day (QID) | RECTAL | Status: DC | PRN

## 2013-11-11 NOTE — Progress Notes (Signed)
Report called to [facility], RN on [address]. Pt's VSS, no c/o pain, all due meds given. Personal belongings with pt, family at bedside. Pt transferred to 3E07 on monitor, via wheelchair.

## 2013-11-11 NOTE — Progress Notes (Signed)
Pt states that she feels fine without BiPAP and that she does not need it. Pt vitals are stable. No distress noted.

## 2013-11-11 NOTE — Progress Notes (Signed)
ANTICOAGULATION CONSULT NOTE - Follow Up Consult  Pharmacy Consult for warfarin Indication: atrial fibrillation  Allergies  Allergen Reactions  . Ceftriaxone     RASH BUT TOLERATED AMPICILLIN AND IMIPENEM WITHOUT PROBLEMS  . Codeine Nausea Only    Patient Measurements: Height: 5\' 7"  (170.2 cm) Weight: 171 lb 11.8 oz (77.9 kg) IBW/kg (Calculated) : 61.6  Vital Signs: Temp: 97.9 F (36.6 C) (08/19 0334) Temp src: Oral (08/19 0334) BP: 127/88 mmHg (08/19 0334) Pulse Rate: 96 (08/19 0334)  Labs:  Recent Labs  11/09/13 0315  11/09/13 0947 11/10/13 0234 11/11/13 0304  HGB  --   < > 10.7* 9.6* 10.6*  HCT  --   --  32.4* 28.8* 31.2*  PLT  --   --  60* 49* 62*  LABPROT 31.0*  --   --  29.9* 26.3*  INR 2.98*  --   --  2.85* 2.42*  CREATININE  --   --  1.36* 1.18* 1.09  < > = values in this interval not displayed.  Estimated Creatinine Clearance: 47.9 ml/min (by C-G formula based on Cr of 1.09).  Assessment: 3 yoF presents with generalized weakness, vomiting, and increased confusion. She continues on coumadin for hx of afib. INR remains therapeutic at 2.42 but is trending down after held dose and reduced doses. Noted positive FOB however MD noted external hemorrhoids. Platelets are also very low at 62 but this is a chronic issue. Coumadin to continue. Of note, LFTs are significantly elevated but trending down and amiodarone has been started which will increase the INR.  Goal of Therapy:  INR 2-3   Plan:  1. Repeat Warfarin 2mg  PO x 1 tonight  2. F/u AM INR  Salome Arnt, PharmD, BCPS Pager # [phone] 11/11/2013 10:35 AM

## 2013-11-11 NOTE — Progress Notes (Signed)
[place] TEAM 1 - Stepdown/ICU TEAM Progress Note  [name] NIO:[id number] DOB: 03-26-1941 DOA: 11/07/2013 PCP: Hollace Kinnier, DO  Admit HPI / Brief Narrative: 73 y.o. 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 She had been complaining of weakness and fatigue for several days and had a poor appetite.  She had 4 episodes of vomiting, nonbilious and nonbloody.   Patient required BiPAP while in the emergency room due to respiratory acidosis and tachypnea. While in the emergency department, patient was noted to have atrial fibrillation with RVR, and started on diltiazem drip.  Interim history  Patient was started on Solucortef, with slow wean in process  Diltiazem IV has been transitioned to oral dosing. AKI and transaminases improving. Currently on ampicillin for prophylaxis of recurrent endocarditis.   HPI/Subjective: Pt has no new complaints today.  She is anxious to progress.    Assessment/Plan:  SIRS - suspected severe sepsis with lactic acidosis  -Secondary to unknown source at this time. Upon admission patient had a leukocytosis, and tachycardia -Leukocytosis resolved, tachycardia resolved, afebrile  -possibly secondary to viral gastroenteritis versus recurrent endocarditis  -Chest x-ray negative for acute cardiopulmonary process, UA negative for infection  -Blood cultures no growth  -Patient currently has no diarrhea or complaints of abdominal pain  -Infectious disease, Dr. Linus Salmons, consulted  -repeat lactic acid improved, 2.6  -possibly related to underlying lung disease  -no further evaluation planned at this time - taper steroids as able   Acute hypoxic respiratory failure  -Possibly secondary to exac of underlying fibrosis  -resolved - stats 90% or > on RA -Initially required  BIPAP  -Patient does NOT wish to be intubated  -Possibly secondary to patient's A. fib RVR versus sepsis   Chronic Atrial fibrillation with acute RVR  -Continue diltiazem  -Cardiology consulted  -TSH 2.17, Magnesium 2.2, phos 4.4  -Continue Coumadin per pharmacy  -currently on amio per Cards - would hesitate to continue this long term in setting of possible underling chronic lung disease   Acute kidney injury on CAD stage III  -Creatinine baseline less than 1  -Likely secondary to hypoperfusion from acute resp failure/sepsis  -essentially resolved   Hyperkalemia  -Resolved   Elevated transaminases  -Likely secondary to sepsis/shock liver from hypoperfusion secondary to Afib RVR  -Trending downward  -Abdominal ultrasound: no acute abnormality seen within the abdomen  -Continue to monitor LFTs   Nausea and vomiting  -Resolved, Likely related to sepsis versus gastroenteritis    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  -Continue plaquenil  -Will begin to taper hydrocortisone  -PM cortisol WNL   Hyperlipidemia  -Continue statin   Chronic diastolic heart failure  -Will hold diuretics at this time due to patient's sepsis  -Currently compensated  -Will continue to monitor her daily weights and strict intake and output   Recurrent enterococcal endocarditis  -Ampicillin restarted on 8/17  -Discontinued daptomycin and Zosyn as blood cultures negative   -Infectious disease consulted   Normocytic Anemia  -Fecal occult was positive, however, patient on coumadin and does have external hemorrhoids, will continue to monitor CBC  -Anemia panel: Iron 86, Ferritin 3177 (likely acute phase reactant)   Code Status: NO CODE BLUE Family Communication: no family present at time of exam Disposition Plan: transfer to tele bed   Consultants: ID Cardiology  Antibiotics: Amoxicillin 8/17 >>  DVT prophylaxis: warfarin  Objective: Blood pressure 127/88, pulse 96,  temperature 97.8 F (36.6 C), temperature source Oral, resp. rate 20, height 5\' 7"  (1.702 m), weight 77.9 kg (171 lb 11.8 oz), SpO2 98.00%.  Intake/Output Summary (Last 24 hours) at 11/11/13 1501 Last data filed at 11/11/13 1154  Gross per 24 hour  Intake    240 ml  Output    275 ml  Net    -35 ml   Exam: General: No acute respiratory distress when resting in bed  Lungs: Clear to auscultation bilaterally without wheezes or crackles - breath sounds distant  Cardiovascular: Regular rate without gallop or rub normal S1 and S2 Abdomen: Nontender, nondistended, soft, bowel sounds positive, no rebound, no ascites, no appreciable mass Extremities: No significant cyanosis, clubbing, or edema bilateral lower extremities  Data Reviewed: Basic Metabolic Panel:  Recent Labs Lab 11/07/13 0735 11/07/13 0752 11/07/13 1340 11/08/13 0355 11/09/13 0947 11/10/13 0234 11/11/13 0304  NA 142 140  --  135* 137 139 138  K 6.4* 5.9*  --  5.9* 4.1 3.9 3.9  CL 106 117*  --  106 106 109 107  CO2 10*  --   --  14* 16* 15* 17*  GLUCOSE 32* 30*  --  109* 171* 170* 145*  BUN 25* 32*  --  32* 27* 26* 29*  CREATININE 1.65* 1.80*  --  1.89* 1.36* 1.18* 1.09  CALCIUM 10.1  --   --  8.9 8.7 8.8 9.3  MG  --   --  2.2  --   --   --   --   PHOS  --   --  4.4  --   --   --   --     Liver Function Tests:  Recent Labs Lab 11/07/13 0735 11/08/13 0355 11/09/13 0947 11/10/13 0234 11/11/13 0304  AST 244* 1870* 562* 301* 142*  ALT 179* 1428* 1006* 812* 603*  ALKPHOS 170* 124* 119* 133* 146*  BILITOT 2.7* 2.1* 2.1* 1.4* 1.2  PROT 7.8 5.9* 5.8* 5.8* 6.1  ALBUMIN 3.6 2.7* 2.5* 2.4* 2.6*   No results found for this basename: LIPASE, AMYLASE,  in the last 168 hours No results found for this basename: AMMONIA,  in the last 168 hours  Coags:  Recent Labs Lab 11/07/13 0735 11/08/13 0355 11/09/13 0315 11/10/13 0234 11/11/13 0304  INR 1.96* 3.14* 2.98* 2.85* 2.42*   No results found for this basename:  PTT,  in the last 168 hours  CBC:  Recent Labs Lab 11/07/13 0735 11/07/13 0752 11/08/13 0355 11/09/13 0947 11/10/13 0234 11/11/13 0304  WBC 11.6*  --  9.2 6.6 4.2 5.7  NEUTROABS 7.0  --   --   --   --   --   HGB 13.1 15.0 10.2* 10.7* 9.6* 10.6*  HCT 40.9 44.0 31.3* 32.4* 28.8* 31.2*  MCV 99.8  --  97.5 95.6 95.0 95.7  PLT 100*  --  59* 60* 49* 62*   Cardiac Enzymes:  Recent Labs Lab 11/07/13 0140  CKTOTAL 156   CBG:  Recent Labs Lab 11/07/13 0828 11/07/13 0933  GLUCAP 50* 138*    Recent Results (from the past 240 hour(s))  URINE CULTURE     Status: None   Collection Time    11/07/13  7:56 AM      Result Value Ref Range Status   Specimen Description URINE, CATHETERIZED   Final   Special Requests NONE   Final  Culture  Setup Time     Final   Value: 11/07/2013 18:34     Performed at [facility] Count     Final   Value: NO GROWTH     Performed at [facility]   Culture     Final   Value: NO GROWTH     Performed at [facility]   Report Status 11/08/2013 FINAL   Final  CULTURE, BLOOD (ROUTINE X 2)     Status: None   Collection Time    11/07/13  8:20 AM      Result Value Ref Range Status   Specimen Description BLOOD LEFT ARM   Final   Special Requests BOTTLES DRAWN AEROBIC ONLY 4CC   Final   Culture  Setup Time     Final   Value: 11/07/2013 18:00     Performed at [facility]   Culture     Final   Value:        BLOOD CULTURE RECEIVED NO GROWTH TO DATE CULTURE WILL BE HELD FOR 5 DAYS BEFORE ISSUING A FINAL NEGATIVE REPORT     Performed at [facility]   Report Status PENDING   Incomplete  CULTURE, BLOOD (ROUTINE X 2)     Status: None   Collection Time    11/07/13  8:27 AM      Result Value Ref Range Status   Specimen Description BLOOD LEFT HAND   Final   Special Requests BOTTLES DRAWN AEROBIC ONLY 3CC   Final   Culture  Setup Time     Final   Value: 11/07/2013 18:00     Performed at [facility]   Culture     Final   Value:        BLOOD CULTURE RECEIVED NO GROWTH TO DATE CULTURE WILL BE HELD FOR 5 DAYS BEFORE ISSUING A FINAL NEGATIVE REPORT     Performed at [facility]   Report Status PENDING   Incomplete  MRSA PCR SCREENING     Status: None   Collection Time    11/07/13  2:09 PM      Result Value Ref Range Status   MRSA by PCR NEGATIVE  NEGATIVE Final   Comment:            The GeneXpert MRSA Assay (FDA     approved for NASAL specimens     only), is one component of a     comprehensive MRSA colonization     surveillance program. It is not     intended to diagnose MRSA     infection nor to guide or     monitor treatment for     MRSA infections.     Studies:  Recent x-ray studies have been reviewed in detail by the Attending Physician  Scheduled Meds:  Scheduled Meds: . amiodarone  200 mg Oral Daily  . amoxicillin  500 mg Oral Q12H  . antiseptic oral rinse  7 mL Mouth Rinse BID  . buprenorphine  5 mcg Transdermal Weekly  . diltiazem  30 mg Oral 4 times per day  . gabapentin  300 mg Oral QHS  . hydrocortisone sod succinate (SOLU-CORTEF) inj  100 mg Intravenous Q12H  . hydroxychloroquine  200 mg Oral BID  . pantoprazole (PROTONIX) IV  40 mg Intravenous Q24H  . ramipril  2.5 mg Oral Daily  . saccharomyces boulardii  250 mg Oral BID  . warfarin  2  mg Oral ONCE-1800  . Warfarin - Pharmacist Dosing Inpatient   Does not apply q1800    Time spent on care of this patient: 35 mins   [facility] T , MD   [facility] Office  [phone] Pager - Text Page per Amion as per below:  On-Call/Text Page:      [email or web address]      password [email or web address]  If 7PM-7AM, please contact night-coverage [email or web address] Password [email or web address] 11/11/2013, 3:01 PM   LOS: 4 days

## 2013-11-11 NOTE — Progress Notes (Signed)
Subjective:   Patient denies any chest pain or shortness of breath. Denies palpitation. Complains of generalized achiness. Denies fever or chills. Remains in A. fib with controlled ventricular response.  Objective:  Vital Signs in the last 24 hours: Temp:  [97.5 F (36.4 C)-97.9 F (36.6 C)] 97.8 F (36.6 C) (08/19 1154) Pulse Rate:  [89-108] 96 (08/19 0334) Resp:  [18-25] 20 (08/19 0334) BP: (115-149)/(55-88) 127/88 mmHg (08/19 0334) SpO2:  [96 %-99 %] 98 % (08/19 0334) Weight:  [77.9 kg (171 lb 11.8 oz)] 77.9 kg (171 lb 11.8 oz) (08/19 0334)  Intake/Output from previous day: 08/18 0701 - 08/19 0700 In: 240 [P.O.:240] Out: 650 [Urine:650] Intake/Output from this shift:    Physical Exam: Neck: no adenopathy, no carotid bruit, no JVD and supple, symmetrical, trachea midline Lungs: Decreased breath sound at bases Heart: irregularly irregular rhythm, S1, S2 normal and 2/6 systolic murmur noted Abdomen: soft, non-tender; bowel sounds normal; no masses,  no organomegaly Extremities: extremities normal, atraumatic, no cyanosis or edema  Lab Results:  Recent Labs  11/10/13 0234 11/11/13 0304  WBC 4.2 5.7  HGB 9.6* 10.6*  PLT 49* 62*    Recent Labs  11/10/13 0234 11/11/13 0304  NA 139 138  K 3.9 3.9  CL 109 107  CO2 15* 17*  GLUCOSE 170* 145*  BUN 26* 29*  CREATININE 1.18* 1.09   No results found for this basename: TROPONINI, CK, MB,  in the last 72 hours Hepatic Function Panel  Recent Labs  11/11/13 0304  PROT 6.1  ALBUMIN 2.6*  AST 142*  ALT 603*  ALKPHOS 146*  BILITOT 1.2   No results found for this basename: CHOL,  in the last 72 hours No results found for this basename: PROTIME,  in the last 72 hours  Imaging: Imaging results have been reviewed and No results found.  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

## 2013-11-11 NOTE — Progress Notes (Signed)
Physical Therapy Treatment Patient Details Name: [name] MRN: [id number] DOB: 20-Mar-1941 Today's Date: 11/11/2013    History of Present Illness Patient is a 73 yo female with complex medical history. Adtmitted on biPap with respiratory distress. Patient with recent admission on 6/20 with h/o diarrhea x 2 months with progressive weakness. Pt was unable to walk on admission. PMHx- lupus, AVR with endocarditis, home O2 (daughter reports she only uses at night), pulmonary fibrosis, arthritis, CHF.              PT Comments    Patient session again limited by significantly elevated HR  Uppers 140s with standing, increased time to resolve. Patient positioned in chair. Continues to require assist for stability. Will see and progress as tolerated.   Follow Up Recommendations  Home health PT;Supervision/Assistance - 24 hour     Equipment Recommendations  None recommended by PT    Recommendations for Other Services       Precautions / Restrictions Precautions Precautions: Fall Restrictions Weight Bearing Restrictions: No    Mobility  Bed Mobility Overal bed mobility: Needs Assistance Bed Mobility: Supine to Sit     Supine to sit: Min assist     General bed mobility comments: HOB elevated today, heavy reliance on bed rail, poor postural control  Transfers Overall transfer level: Needs assistance Equipment used: Rolling walker (2 wheeled) Transfers: Sit to/from Stand Sit to Stand: Min assist;+2 physical assistance Stand pivot transfers: Min assist;+2 physical assistance       General transfer comment: Pt with cues for safety with mobility, cues to not abandom rw. Assist for stability during transfer. (decreased posterior lean today)  Ambulation/Gait                 Stairs            Wheelchair Mobility    Modified Rankin (Stroke Patients Only)       Balance   Sitting-balance support: Feet supported Sitting balance-Leahy Scale: Fair      Standing balance support: Bilateral upper extremity supported Standing balance-Leahy Scale: Poor                      Cognition Arousal/Alertness: Awake/alert Behavior During Therapy: Flat affect Overall Cognitive Status: Within Functional Limits for tasks assessed                      Exercises      General Comments        Pertinent Vitals/Pain Pain Assessment: No/denies pain    Home Living                      Prior Function            PT Goals (current goals can now be found in the care plan section) Acute Rehab PT Goals Patient Stated Goal: to go home PT Goal Formulation: With patient Time For Goal Achievement: 11/24/13 Potential to Achieve Goals: Good Progress towards PT goals: Progressing toward goals    Frequency  Min 4X/week    PT Plan Current plan remains appropriate    Co-evaluation             End of Session Equipment Utilized During Treatment: Oxygen;Gait belt Activity Tolerance: Treatment limited secondary to medical complications (Comment) (HR elevated to again to 140s with minimal activty) Patient left: in chair;with call bell/phone within reach     Time: 3762-8315 PT Time Calculation (min): 16 min  Charges:  $Therapeutic Activity: 8-22 mins                    G CodesDuncan Dull 12/03/13, 9:56 AM Alben Deeds, PT DPT  [phone]

## 2013-11-12 LAB — CBC
HCT: 33.5 % — ABNORMAL LOW (ref 36.0–46.0)
HEMOGLOBIN: 11.2 g/dL — AB (ref 12.0–15.0)
MCH: 32.1 pg (ref 26.0–34.0)
MCHC: 33.4 g/dL (ref 30.0–36.0)
MCV: 96 fL (ref 78.0–100.0)
Platelets: 81 10*3/uL — ABNORMAL LOW (ref 150–400)
RBC: 3.49 MIL/uL — AB (ref 3.87–5.11)
RDW: 17.2 % — ABNORMAL HIGH (ref 11.5–15.5)
WBC: 6.7 10*3/uL (ref 4.0–10.5)

## 2013-11-12 LAB — COMPREHENSIVE METABOLIC PANEL
ALBUMIN: 2.7 g/dL — AB (ref 3.5–5.2)
ALK PHOS: 150 U/L — AB (ref 39–117)
ALT: 473 U/L — AB (ref 0–35)
AST: 83 U/L — AB (ref 0–37)
Anion gap: 14 (ref 5–15)
BUN: 34 mg/dL — ABNORMAL HIGH (ref 6–23)
CALCIUM: 9.4 mg/dL (ref 8.4–10.5)
CO2: 16 mEq/L — ABNORMAL LOW (ref 19–32)
Chloride: 109 mEq/L (ref 96–112)
Creatinine, Ser: 1.03 mg/dL (ref 0.50–1.10)
GFR calc non Af Amer: 52 mL/min — ABNORMAL LOW (ref >90)
GFR, EST AFRICAN AMERICAN: 60 mL/min — AB (ref >90)
GLUCOSE: 130 mg/dL — AB (ref 70–99)
POTASSIUM: 4.5 meq/L (ref 3.7–5.3)
SODIUM: 139 meq/L (ref 137–147)
Total Bilirubin: 1.1 mg/dL (ref 0.3–1.2)
Total Protein: 6.2 g/dL (ref 6.0–8.3)

## 2013-11-12 LAB — PROTIME-INR
INR: 2.71 — ABNORMAL HIGH (ref 0.00–1.49)
PROTHROMBIN TIME: 28.8 s — AB (ref 11.6–15.2)

## 2013-11-12 MED ORDER — WARFARIN SODIUM 2 MG PO TABS
2.0000 mg | ORAL_TABLET | Freq: Once | ORAL | Status: AC
  Administered 2013-11-12: 2 mg via ORAL
  Filled 2013-11-12: qty 1

## 2013-11-12 NOTE — Progress Notes (Signed)
Subjective:  Denies chest pain or shortness of breath. States overall feeling a little better slowly improving. Remains in A. Fib with controlled ventricular response. Right bundle branch block resolved  Objective:  Vital Signs in the last 24 hours: Temp:  [97.3 F (36.3 C)-98 F (36.7 C)] 97.3 F (36.3 C) (08/20 0645) Pulse Rate:  [74-106] 106 (08/20 1006) Resp:  [20-28] 20 (08/20 0645) BP: (120-140)/(48-87) 135/83 mmHg (08/20 1006) SpO2:  [91 %-100 %] 93 % (08/20 0645) Weight:  [77.7 kg (171 lb 4.8 oz)] 77.7 kg (171 lb 4.8 oz) (08/20 0536)  Intake/Output from previous day: 08/19 0701 - 08/20 0700 In: -  Out: 525 [Urine:525] Intake/Output from this shift: Total I/O In: 480 [P.O.:480] Out: -   Physical Exam: Neck: no adenopathy, no carotid bruit, no JVD and supple, symmetrical, trachea midline Lungs: decreased breath sound at bases Heart: irregularly irregular rhythm, S1, S2 normal and 2/6 systolic murmur noted Abdomen: soft, non-tender; bowel sounds normal; no masses,  no organomegaly Extremities: extremities normal, atraumatic, no cyanosis or edema  Lab Results:  Recent Labs  11/11/13 0304 11/12/13 0500  WBC 5.7 6.7  HGB 10.6* 11.2*  PLT 62* 81*    Recent Labs  11/11/13 0304 11/12/13 0500  NA 138 139  K 3.9 4.5  CL 107 109  CO2 17* 16*  GLUCOSE 145* 130*  BUN 29* 34*  CREATININE 1.09 1.03   No results found for this basename: TROPONINI, CK, MB,  in the last 72 hours Hepatic Function Panel  Recent Labs  11/12/13 0500  PROT 6.2  ALBUMIN 2.7*  AST 83*  ALT 473*  ALKPHOS 150*  BILITOT 1.1   No results found for this basename: CHOL,  in the last 72 hours No results found for this basename: PROTIME,  in the last 72 hours  Imaging: Imaging results have been reviewed and No results found.  Cardiac Studies:  Assessment/Plan:  Chronic atrial fibrillation with controlled ventricular response  Status post hyperkalemia  History of recurrent mitral  valve and bioprosthetic aortic valve endocarditis on chronic antibiotic therapy  Weakness/deconditioning  Protein calorie malnutrition  Mixed connective tissue disease  History of rheumatoid arthritis  CAD status post CABG and bioprosthetic aortic valve replacement in the past  valvular heart disease with moderate MR and TR  Resolving passive hepatic congestion secondary to above  Chronic kidney disease improved Plan Continue present management We'll reduce the dose of amiodarone 100 milligrams daily as outpatient.  LOS: 5 days    Marquel Spoto N 11/12/2013, 11:39 AM

## 2013-11-12 NOTE — Progress Notes (Signed)
ANTICOAGULATION CONSULT NOTE - Follow Up Consult  Pharmacy Consult for warfarin Indication: atrial fibrillation  Allergies  Allergen Reactions  . Ceftriaxone     RASH BUT TOLERATED AMPICILLIN AND IMIPENEM WITHOUT PROBLEMS  . Codeine Nausea Only    Patient Measurements: Height: 5\' 7"  (170.2 cm) Weight: 171 lb 4.8 oz (77.7 kg) (scale A) IBW/kg (Calculated) : 61.6  Vital Signs: Temp: 97.3 F (36.3 C) (08/20 0645) Temp src: Oral (08/20 0645) BP: 135/83 mmHg (08/20 1006) Pulse Rate: 106 (08/20 1006)  Labs:  Recent Labs  11/10/13 0234 11/11/13 0304 11/12/13 0500  HGB 9.6* 10.6* 11.2*  HCT 28.8* 31.2* 33.5*  PLT 49* 62* 81*  LABPROT 29.9* 26.3* 28.8*  INR 2.85* 2.42* 2.71*  CREATININE 1.18* 1.09 1.03    Estimated Creatinine Clearance: 50.7 ml/min (by C-G formula based on Cr of 1.03).  Assessment: 54 yoF presents with generalized weakness, vomiting, and increased confusion. She continues on coumadin for hx of afib  INR = 2.71 (trended up some)  Goal of Therapy:  INR 2-3   Plan:  1. Repeat Warfarin 2mg  PO x 1 tonight  2. F/u AM INR  Anette Guarneri, PharmD Pager # [phone]  11/12/2013 10:44 AM

## 2013-11-12 NOTE — Progress Notes (Signed)
Patient evaluated for community based chronic disease management services with [facility] as a benefit of patient's [employer]. Spoke with patient at bedside to explain [facility] services.  Patient has accepted services with verbal consent.  She elects her daughter [name] [phone]) as her authorized contact.  Her daughter [place] Freshour is her HCPOA but has a new number that the patient does not recall.  She may benefit from CHF/CAD management support at home. She has a limited understanding of early symptom recognition and how to activate an action plan to stabilize at home.  Patient will receive a post discharge transition of care call and will be evaluated for monthly home visits for assessments and disease process education.  Left contact information and [facility] literature at bedside. Made Inpatient Case Manager aware that [facility] following. Of note, [facility] Management services does not replace or interfere with any services that are arranged by inpatient case management or social work.  For additional questions or referrals please contact Corliss Blacker BSN RN [facility] Liaison at [phone].

## 2013-11-12 NOTE — Progress Notes (Signed)
Physical Therapy Treatment Patient Details Name: [name] MRN: [id number] DOB: [birth year] Today's Date: 11/12/2013    History of Present Illness 73 yo female with complex medical history. Adtmitted on biPap with respiratory distress. Patient with recent admission on 6/20 with h/o diarrhea x 2 months with progressive weakness. Pt was unable to walk on admission. PMHx- lupus, AVR with endocarditis, home O2 (daughter reports she only uses at night), pulmonary fibrosis, arthritis, CHF.  Pt dx with SIRS, and acute on chronic respiratory distress (underlying fibrosis).               PT Comments    Pt is progressing well with her mobility. She is planning on going and staying with her daughter who she reports can provide 24/7 assist at discharge.  She continues to be dyspneic and weaker than her usual, so PT will continue to follow acutely and continue to recommend HHPT at discharge.   Follow Up Recommendations  Home health PT;Supervision for mobility/OOB     Equipment Recommendations  None recommended by PT    Recommendations for Other Services   NA       Precautions / Restrictions Precautions Precautions: Fall    Mobility  Bed Mobility Overal bed mobility: Modified Independent             General bed mobility comments: Heavy reliance on bed rail to get to EOB. Exta time needed without external assist.   Transfers Overall transfer level: Needs assistance Equipment used: Rolling walker (2 wheeled) Transfers: Sit to/from Stand Sit to Stand: Min guard         General transfer comment: Min guard assist to support trunk during transitions.   Ambulation/Gait Ambulation/Gait assistance: Min guard Ambulation Distance (Feet): 100 Feet Assistive device: Rolling walker (2 wheeled) Gait Pattern/deviations: Step-through pattern;Trunk flexed;Shuffle Gait velocity: decreased   General Gait Details: Pt with shuffling gait pattern and increased trunk flexion.  DOE consistant  throught 2/3 even at rest.  Pt given verbal cues for upright posture as she tends to flex over the RW.  I encouraged good posture to help lift her lungs and assist with her ease of breating.           Balance Overall balance assessment: Needs assistance Sitting-balance support: Feet supported;No upper extremity supported Sitting balance-Leahy Scale: Good     Standing balance support: Bilateral upper extremity supported;No upper extremity supported Standing balance-Leahy Scale: Fair                      Cognition Arousal/Alertness: Awake/alert Behavior During Therapy: WFL for tasks assessed/performed Overall Cognitive Status: Within Functional Limits for tasks assessed                             Pertinent Vitals/Pain Pain Assessment: No/denies pain           PT Goals (current goals can now be found in the care plan section) Acute Rehab PT Goals Patient Stated Goal: to go home Progress towards PT goals: Progressing toward goals    Frequency  Min 3X/week    PT Plan Current plan remains appropriate;Frequency needs to be updated       End of Session Equipment Utilized During Treatment: Gait belt Activity Tolerance: Patient limited by fatigue Patient left: in bed;Other (comment) (seated EOB)     Time: 1458-1510 PT Time Calculation (min): 12 min  Charges:  $Gait Training: 8-22 mins  Barbarann Ehlers [place], [place], DPT [phone]    11/12/2013, 4:11 PM

## 2013-11-12 NOTE — Progress Notes (Signed)
[facility] TEAM 1 - Stepdown/ICU TEAM Progress Note  [name] NWG:[id number] DOB: [birth year] DOA: 11/07/2013 PCP: Hollace Kinnier, DO  Admit HPI / Brief Narrative: 73 y.o. 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 She had been complaining of weakness and fatigue for several days and had a poor appetite.  She had 4 episodes of vomiting, nonbilious and nonbloody.   Patient required BiPAP while in the emergency room due to respiratory acidosis and tachypnea. While in the emergency department, patient was noted to have atrial fibrillation with RVR, and started on diltiazem drip.  Interim history  Patient was started on Solucortef, with slow wean in process  Diltiazem IV has been transitioned to oral dosing. AKI and transaminases improving. Currently on ampicillin for prophylaxis of recurrent endocarditis.   HPI/Subjective: Pt has no new complaints today.  She is anxious to progress.    Assessment/Plan:  SIRS - suspected severe sepsis with lactic acidosis  -Secondary to unknown source at this time. Upon admission patient had a leukocytosis, and tachycardia -Leukocytosis resolved, , afebrile , persistent tachycardia -possibly secondary to viral gastroenteritis versus recurrent endocarditis  -Chest x-ray negative for acute cardiopulmonary process, UA negative for infection  -Blood cultures no growth  -Patient currently has no diarrhea or complaints of abdominal pain  -Infectious disease, Dr. Linus Salmons, consulted  -repeat lactic acid improved, 2.6  -possibly related to underlying lung disease  -no further evaluation planned at this time - taper steroids as able   Acute hypoxic respiratory failure  -Possibly secondary to exac of underlying fibrosis  -resolved - stats 90% or > on RA -Initially  required BIPAP  -Patient does NOT wish to be intubated  -Possibly secondary to patient's A. fib RVR versus sepsis   Chronic Atrial fibrillation with acute RVR  -Continue diltiazem  -Cardiology consulted  -TSH 2.17, Magnesium 2.2, phos 4.4  -Continue Coumadin per pharmacy  -currently on amio per Cards - would hesitate to continue this long term in setting of possible underling chronic lung disease   Acute kidney injury on CAD stage III  -Creatinine baseline less than 1  -Likely secondary to hypoperfusion from acute resp failure/sepsis  -essentially resolved   Hyperkalemia  -Resolved   Elevated transaminases  -Likely secondary to sepsis/shock liver from hypoperfusion secondary to Afib RVR  -Trending downward  -Abdominal ultrasound: no acute abnormality seen within the abdomen  -Continue to monitor LFTs   Nausea and vomiting  -Resolved, Likely related to sepsis versus gastroenteritis    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  -Continue plaquenil  -Will begin to taper hydrocortisone  -PM cortisol WNL   Hyperlipidemia  -Continue statin   Chronic diastolic heart failure  -Will hold diuretics at this time due to patient's sepsis  -Currently compensated  -Will continue to monitor her daily weights and strict intake and output   Recurrent enterococcal endocarditis  -Ampicillin restarted on 8/17  -Discontinued daptomycin and Zosyn as blood cultures negative   -Infectious disease consulted   Normocytic Anemia  -Fecal occult was positive, however, patient on coumadin and does have external hemorrhoids, will continue to monitor CBC  -Anemia panel: Iron 86, Ferritin 3177 (likely acute phase reactant)   Code Status: NO CODE BLUE Family Communication: no family present at time of exam Disposition Plan: possible discharge in am.   Consultants: ID Cardiology  Antibiotics: Amoxicillin 8/17 >>  DVT prophylaxis: warfarin  Objective: Blood pressure 168/93,  pulse 113, temperature 97.3 F (36.3 C), temperature source Oral, resp. rate 20, height 5\' 7"  (1.702 m), weight 77.7 kg (171 lb 4.8 oz), SpO2 93.00%.  Intake/Output Summary (Last 24 hours) at 11/12/13 1421 Last data filed at 11/12/13 1327  Gross per 24 hour  Intake    720 ml  Output    525 ml  Net    195 ml   Exam: General: No acute respiratory distress when resting in bed  Lungs: Clear to auscultation bilaterally without wheezes or crackles - breath sounds distant  Cardiovascular: Regular rate without gallop or rub normal S1 and S2 Abdomen: Nontender, nondistended, soft, bowel sounds positive, no rebound, no ascites, no appreciable mass Extremities: No significant cyanosis, clubbing, or edema bilateral lower extremities  Data Reviewed: Basic Metabolic Panel:  Recent Labs Lab 11/07/13 1340 11/08/13 0355 11/09/13 0947 11/10/13 0234 11/11/13 0304 11/12/13 0500  NA  --  135* 137 139 138 139  K  --  5.9* 4.1 3.9 3.9 4.5  CL  --  106 106 109 107 109  CO2  --  14* 16* 15* 17* 16*  GLUCOSE  --  109* 171* 170* 145* 130*  BUN  --  32* 27* 26* 29* 34*  CREATININE  --  1.89* 1.36* 1.18* 1.09 1.03  CALCIUM  --  8.9 8.7 8.8 9.3 9.4  MG 2.2  --   --   --   --   --   PHOS 4.4  --   --   --   --   --     Liver Function Tests:  Recent Labs Lab 11/08/13 0355 11/09/13 0947 11/10/13 0234 11/11/13 0304 11/12/13 0500  AST 1870* 562* 301* 142* 83*  ALT 1428* 1006* 812* 603* 473*  ALKPHOS 124* 119* 133* 146* 150*  BILITOT 2.1* 2.1* 1.4* 1.2 1.1  PROT 5.9* 5.8* 5.8* 6.1 6.2  ALBUMIN 2.7* 2.5* 2.4* 2.6* 2.7*   No results found for this basename: LIPASE, AMYLASE,  in the last 168 hours No results found for this basename: AMMONIA,  in the last 168 hours  Coags:  Recent Labs Lab 11/08/13 0355 11/09/13 0315 11/10/13 0234 11/11/13 0304 11/12/13 0500  INR 3.14* 2.98* 2.85* 2.42* 2.71*   No results found for this basename: PTT,  in the last 168 hours  CBC:  Recent Labs Lab  11/07/13 0735  11/08/13 0355 11/09/13 0947 11/10/13 0234 11/11/13 0304 11/12/13 0500  WBC 11.6*  --  9.2 6.6 4.2 5.7 6.7  NEUTROABS 7.0  --   --   --   --   --   --   HGB 13.1  < > 10.2* 10.7* 9.6* 10.6* 11.2*  HCT 40.9  < > 31.3* 32.4* 28.8* 31.2* 33.5*  MCV 99.8  --  97.5 95.6 95.0 95.7 96.0  PLT 100*  --  59* 60* 49* 62* 81*  < > = values in this interval not displayed. Cardiac Enzymes:  Recent Labs Lab 11/07/13 0140  CKTOTAL 156   CBG:  Recent Labs Lab 11/07/13 0828 11/07/13 0933  GLUCAP 50* 138*    Recent Results (from the past 240 hour(s))  URINE CULTURE     Status: None   Collection Time    11/07/13  7:56 AM      Result Value Ref Range Status   Specimen Description URINE, CATHETERIZED   Final   Special Requests NONE   Final  Culture  Setup Time     Final   Value: 11/07/2013 18:34     Performed at [facility] Count     Final   Value: NO GROWTH     Performed at [facility]   Culture     Final   Value: NO GROWTH     Performed at [facility]   Report Status 11/08/2013 FINAL   Final  CULTURE, BLOOD (ROUTINE X 2)     Status: None   Collection Time    11/07/13  8:20 AM      Result Value Ref Range Status   Specimen Description BLOOD LEFT ARM   Final   Special Requests BOTTLES DRAWN AEROBIC ONLY 4CC   Final   Culture  Setup Time     Final   Value: 11/07/2013 18:00     Performed at [facility]   Culture     Final   Value:        BLOOD CULTURE RECEIVED NO GROWTH TO DATE CULTURE WILL BE HELD FOR 5 DAYS BEFORE ISSUING A FINAL NEGATIVE REPORT     Performed at [facility]   Report Status PENDING   Incomplete  CULTURE, BLOOD (ROUTINE X 2)     Status: None   Collection Time    11/07/13  8:27 AM      Result Value Ref Range Status   Specimen Description BLOOD LEFT HAND   Final   Special Requests BOTTLES DRAWN AEROBIC ONLY 3CC   Final   Culture  Setup Time     Final   Value: 11/07/2013 18:00     Performed at  [facility]   Culture     Final   Value:        BLOOD CULTURE RECEIVED NO GROWTH TO DATE CULTURE WILL BE HELD FOR 5 DAYS BEFORE ISSUING A FINAL NEGATIVE REPORT     Performed at [facility]   Report Status PENDING   Incomplete  MRSA PCR SCREENING     Status: None   Collection Time    11/07/13  2:09 PM      Result Value Ref Range Status   MRSA by PCR NEGATIVE  NEGATIVE Final   Comment:            The GeneXpert MRSA Assay (FDA     approved for NASAL specimens     only), is one component of a     comprehensive MRSA colonization     surveillance program. It is not     intended to diagnose MRSA     infection nor to guide or     monitor treatment for     MRSA infections.     Studies:  Recent x-ray studies have been reviewed in detail by the Attending Physician  Scheduled Meds:  Scheduled Meds: . amiodarone  200 mg Oral Daily  . amoxicillin  500 mg Oral Q12H  . antiseptic oral rinse  7 mL Mouth Rinse BID  . buprenorphine  5 mcg Transdermal Weekly  . diltiazem  30 mg Oral 4 times per day  . gabapentin  300 mg Oral QHS  . hydrocortisone sod succinate (SOLU-CORTEF) inj  50 mg Intravenous Q12H  . hydroxychloroquine  200 mg Oral BID  . pantoprazole  40 mg Oral Q1200  . ramipril  2.5 mg Oral Daily  . saccharomyces boulardii  250 mg Oral BID  . warfarin  2 mg Oral  ONCE-1800  . Warfarin - Pharmacist Dosing Inpatient   Does not apply q1800    Time spent on care of this patient: 38 mins   [place] , MD   [facility] Office  [phone] Pager - Text Page per Amion as per below:  On-Call/Text Page:      [email or web address]      password [email or web address]  If 7PM-7AM, please contact night-coverage [email or web address] Password [email or web address] 11/12/2013, 2:21 PM   LOS: 5 days

## 2013-11-12 NOTE — Evaluation (Signed)
Occupational Therapy Evaluation Patient Details Name: [name] MRN: [id number] DOB: 09/10/1940 Today's Date: 11/12/2013    History of Present Illness 73 yo female with complex medical history. Adtmitted on biPap with respiratory distress. Patient with recent admission on 6/20 with h/o diarrhea x 2 months with progressive weakness. Pt was unable to walk on admission. PMHx- lupus, AVR with endocarditis, home O2 (daughter reports she only uses at night), pulmonary fibrosis, arthritis, CHF.             Clinical Impression   PT admitted with respiratory distress. Pt currently with functional limitiations due to the deficits listed below (see OT problem list). PTA living at home at mOD I level. Pt will benefit from skilled OT to increase their independence and safety with adls and balance to allow discharge return home without OT needs. Ot to follow acutely for BIL LE adls and address skin checks for feet.      Follow Up Recommendations  No OT follow up    Equipment Recommendations  None recommended by OT    Recommendations for Other Services       Precautions / Restrictions Precautions Precautions: Fall      Mobility Bed Mobility Overal bed mobility: Modified Independent                Transfers Overall transfer level: Needs assistance Equipment used: Rolling walker (2 wheeled) Transfers: Sit to/from Stand Sit to Stand: Min guard         General transfer comment: cues for hand placement for safety    Balance Overall balance assessment: Needs assistance         Standing balance support: Bilateral upper extremity supported;During functional activity Standing balance-Leahy Scale: Fair                              ADL Overall ADL's : Needs assistance/impaired Eating/Feeding: Modified independent;Sitting   Grooming: Min guard;Standing               Lower Body Dressing: Maximal assistance;Sit to/from stand Lower Body Dressing Details  (indicate cue type and reason): unable to don either sock. pt is able to cross bil LE. Pt noted to have very red and glassy looking toes Toilet Transfer: Min guard;Ambulation;RW           Functional mobility during ADLs: Min guard;Rolling walker General ADL Comments: Pt supine on arrival and agreeable to OOB. pt progressed to EOB MOD I. pt requires (A) with Bil LE dressing/ bathing which is a change from baseline per patient. pt positioned in chair      Vision                     Perception     Praxis      Pertinent Vitals/Pain Pain Assessment: No/denies pain     Hand Dominance Right   Extremity/Trunk Assessment Upper Extremity Assessment Upper Extremity Assessment: Generalized weakness   Lower Extremity Assessment Lower Extremity Assessment: Defer to PT evaluation   Cervical / Trunk Assessment Cervical / Trunk Assessment: Normal   Communication Communication Communication: No difficulties   Cognition Arousal/Alertness: Awake/alert Behavior During Therapy: Flat affect Overall Cognitive Status: Within Functional Limits for tasks assessed                     General Comments       Exercises       Shoulder Instructions  Home Living Family/patient expects to be discharged to:: Private residence Living Arrangements: Children Available Help at Discharge: Family;Available 24 hours/day Type of Home: House Home Access: Ramped entrance     Home Layout: One level     Bathroom Shower/Tub: Occupational psychologist: Handicapped height     Home Equipment: Environmental consultant - 2 wheels;Bedside commode;Wheelchair - manual;Shower seat - built in;Grab bars - toilet;Grab bars - tub/shower          Prior Functioning/Environment Level of Independence: Needs assistance    ADL's / Homemaking Assistance Needed: reports they help me if I ask. Pt reports baseline deficits with Rt LE reaching feet        OT Diagnosis: Generalized weakness   OT  Problem List: Decreased strength;Decreased activity tolerance;Impaired balance (sitting and/or standing);Decreased safety awareness;Decreased knowledge of use of DME or AE;Decreased knowledge of precautions   OT Treatment/Interventions: Self-care/ADL training;Therapeutic exercise;DME and/or AE instruction;Therapeutic activities;Patient/family education;Balance training    OT Goals(Current goals can be found in the care plan section) Acute Rehab OT Goals Patient Stated Goal: to go home OT Goal Formulation: With patient Time For Goal Achievement: 11/26/13 Potential to Achieve Goals: Good  OT Frequency: Min 2X/week   Barriers to D/C:            Co-evaluation              End of Session Equipment Utilized During Treatment: Gait belt;Rolling walker Nurse Communication: Mobility status;Precautions  Activity Tolerance: Patient tolerated treatment well Patient left: in chair;with call bell/phone within reach   Time: 0950-1005 OT Time Calculation (min): 15 min Charges:  OT General Charges $OT Visit: 1 Procedure OT Evaluation $Initial OT Evaluation Tier I: 1 Procedure OT Treatments $Self Care/Home Management : 8-22 mins G-Codes:    Peri Maris 17-Nov-2013, 11:16 AM Pager: [phone]

## 2013-11-13 LAB — CULTURE, BLOOD (ROUTINE X 2)
Culture: NO GROWTH
Culture: NO GROWTH

## 2013-11-13 LAB — PROTIME-INR
INR: 2.49 — ABNORMAL HIGH (ref 0.00–1.49)
PROTHROMBIN TIME: 26.9 s — AB (ref 11.6–15.2)

## 2013-11-13 MED ORDER — AMIODARONE HCL 200 MG PO TABS: 200.0000 mg | ORAL_TABLET | Freq: Every day | ORAL | Status: DC

## 2013-11-13 MED ORDER — FUROSEMIDE 10 MG/ML IJ SOLN
INTRAMUSCULAR | Status: AC
  Filled 2013-11-13: qty 4

## 2013-11-13 MED ORDER — FUROSEMIDE 10 MG/ML IJ SOLN
20.0000 mg | Freq: Once | INTRAMUSCULAR | Status: AC
  Administered 2013-11-13: 20 mg via INTRAVENOUS

## 2013-11-13 MED ORDER — METOPROLOL TARTRATE 25 MG PO TABS: 25.0000 mg | ORAL_TABLET | Freq: Two times a day (BID) | ORAL | Status: DC

## 2013-11-13 MED ORDER — RAMIPRIL 2.5 MG PO CAPS: 2.5000 mg | ORAL_CAPSULE | Freq: Every day | ORAL | Status: DC

## 2013-11-13 MED ORDER — WARFARIN SODIUM 2 MG PO TABS
2.0000 mg | ORAL_TABLET | Freq: Once | ORAL | Status: DC
  Filled 2013-11-13: qty 1

## 2013-11-13 MED ORDER — METOPROLOL TARTRATE 25 MG PO TABS
25.0000 mg | ORAL_TABLET | Freq: Two times a day (BID) | ORAL | Status: DC
  Administered 2013-11-13: 25 mg via ORAL
  Filled 2013-11-13 (×2): qty 1

## 2013-11-13 MED ORDER — POTASSIUM CHLORIDE CRYS ER 20 MEQ PO TBCR: 20.0000 meq | EXTENDED_RELEASE_TABLET | Freq: Every day | ORAL | Status: DC

## 2013-11-13 MED ORDER — FUROSEMIDE 20 MG PO TABS: 20.0000 mg | ORAL_TABLET | Freq: Every day | ORAL | Status: DC

## 2013-11-13 NOTE — Progress Notes (Signed)
Patient is discharged. IV/Tele Dc'd. No complaints, no signs and symptoms of distress. Son is assisting pt home. Pt leaving via wheelchair. Prescriptions given and explained. Pt verbalized understanding.

## 2013-11-13 NOTE — Evaluation (Signed)
Occupational Therapy Evaluation Patient Details Name: [name] MRN: [id number] DOB: May 16, 1940 Today's Date: 11/13/2013    History of Present Illness 73 yo female with complex medical history. Adtmitted on biPap with respiratory distress. Patient with recent admission on 6/20 with h/o diarrhea x 2 months with progressive weakness. Pt was unable to walk on admission. PMHx- lupus, AVR with endocarditis, home O2 (daughter reports she only uses at night), pulmonary fibrosis, arthritis, CHF.  Pt dx with SIRS, and acute on chronic respiratory distress (underlying fibrosis).              Clinical Impression   Pt is progressing well with her ADL performance and activity tolerance.  However, she continues to be weaker than compared to baseline.  Will continue to follow acutely.    Follow Up Recommendations  No OT follow up    Equipment Recommendations  None recommended by OT    Recommendations for Other Services       Precautions / Restrictions Precautions Precautions: Fall      Mobility Bed Mobility               General bed mobility comments: not assessed. Pt sitting EOB upon OT arrival.  Transfers Overall transfer level: Needs assistance Equipment used: Rolling walker (2 wheeled) Transfers: Sit to/from Stand Sit to Stand: Min guard         General transfer comment: Min guard for safety during power up and descent.    Balance                                            ADL Overall ADL's : Needs assistance/impaired             Lower Body Bathing: Min guard;Sit to/from stand       Lower Body Dressing: Min guard;Sit to/from stand (increased time) Lower Body Dressing Details (indicate cue type and reason): donned/doffed socks while sitting EOB with ankles crossed over legs Toilet Transfer: Min guard;Ambulation;RW   Toileting- Water quality scientist and Hygiene: Min guard;Sit to/from stand       Functional mobility during ADLs: Min  guard;Rolling walker General ADL Comments: Pt reports her daughter has both a tub and walk in shower.  Pt does not however have a shower chair. OT recommended use of 3n1 as a shower chair, but if that does not fit, purchasing shower chair.  OT educated pt and son on acquisition of shower chair and encouraged its use to help minimize fall risk and to conserve energy.  Pt with improved LB ADL performance but still requires increased time to complete tasks.  Pt reports she does have a reacher and long handled sponge, which therapist recommended she use at home. Pt with 2/4 dyspnea during session.  Ambulated in hallway while OT instructed on deep breathing and pacing herself with activities.     Vision                     Perception     Praxis      Pertinent Vitals/Pain Pain Assessment: No/denies pain     Hand Dominance     Extremity/Trunk Assessment             Communication     Cognition Arousal/Alertness: Awake/alert Behavior During Therapy: WFL for tasks assessed/performed Overall Cognitive Status: Within Functional Limits for tasks assessed  General Comments       Exercises       Shoulder Instructions      Home Living                                          Prior Functioning/Environment               OT Diagnosis:     OT Problem List:     OT Treatment/Interventions:      OT Goals(Current goals can be found in the care plan section) Acute Rehab OT Goals Patient Stated Goal: to go home OT Goal Formulation: With patient Time For Goal Achievement: 11/26/13 Potential to Achieve Goals: Good ADL Goals Pt Will Perform Lower Body Bathing: with supervision;sit to/from stand;with adaptive equipment Pt Will Perform Lower Body Dressing: with supervision;with adaptive equipment;sit to/from stand Pt Will Perform Tub/Shower Transfer: with supervision;ambulating;rolling walker;Shower transfer  OT Frequency: Min  2X/week   Barriers to D/C:            Co-evaluation              End of Session Equipment Utilized During Treatment: Gait belt;Rolling walker Nurse Communication: Mobility status  Activity Tolerance: Patient tolerated treatment well Patient left: in chair;with call bell/phone within reach;with nursing/sitter in room;with family/visitor present   Time: 1610-9604 OT Time Calculation (min): 24 min Charges:  OT General Charges $OT Visit: 1 Procedure OT Treatments $Self Care/Home Management : 8-22 mins $Therapeutic Activity: 8-22 mins G-Codes:    Darrol Jump 11/13/2013, 12:05 PM  11/13/2013 Darrol Jump OTR/L Pager [phone] Office [phone]

## 2013-11-13 NOTE — Progress Notes (Signed)
Subjective:  Patient denies any chest pain or shortness of breath. Denies palpitations lightheadedness or syncope. Denies urinary complaints. Denies diarrhea. Denies fever chills. Remains in A. fib with moderate point response.  Objective:  Vital Signs in the last 24 hours: Temp:  [97.4 F (36.3 C)-97.9 F (36.6 C)] 97.4 F (36.3 C) (08/21 0505) Pulse Rate:  [89-113] 93 (08/21 0505) Resp:  [18-20] 20 (08/21 0505) BP: (135-168)/(58-93) 144/84 mmHg (08/21 0505) SpO2:  [94 %-96 %] 95 % (08/21 0505) Weight:  [78.518 kg (173 lb 1.6 oz)] 78.518 kg (173 lb 1.6 oz) (08/21 0505)  Intake/Output from previous day: 08/20 0701 - 08/21 0700 In: 73 [P.O.:960] Out: 300 [Urine:300] Intake/Output from this shift: Total I/O In: 300 [P.O.:300] Out: -   Physical Exam: Neck: no adenopathy, no carotid bruit, no JVD and supple, symmetrical, trachea midline Lungs: clear to auscultation bilaterally Heart: irregularly irregular rhythm, S1, S2 normal and 2/6 systolic murmur noted Abdomen: soft, non-tender; bowel sounds normal; no masses,  no organomegaly Extremities: No clubbing cyanosis 1+ edema noted  Lab Results:  Recent Labs  11/11/13 0304 11/12/13 0500  WBC 5.7 6.7  HGB 10.6* 11.2*  PLT 62* 81*    Recent Labs  11/11/13 0304 11/12/13 0500  NA 138 139  K 3.9 4.5  CL 107 109  CO2 17* 16*  GLUCOSE 145* 130*  BUN 29* 34*  CREATININE 1.09 1.03   No results found for this basename: TROPONINI, CK, MB,  in the last 72 hours Hepatic Function Panel  Recent Labs  11/12/13 0500  PROT 6.2  ALBUMIN 2.7*  AST 83*  ALT 473*  ALKPHOS 150*  BILITOT 1.1   No results found for this basename: CHOL,  in the last 72 hours No results found for this basename: PROTIME,  in the last 72 hours  Imaging: Imaging results have been reviewed and No results found.  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

## 2013-11-13 NOTE — Progress Notes (Signed)
UR completed Keegen Heffern K. Orine Goga, RN, BSN, MSHL, CCM  11/13/2013 11:05 AM

## 2013-11-13 NOTE — Progress Notes (Signed)
ANTICOAGULATION CONSULT NOTE - Follow Up Consult  Pharmacy Consult for warfarin Indication: atrial fibrillation  Allergies  Allergen Reactions  . Ceftriaxone     RASH BUT TOLERATED AMPICILLIN AND IMIPENEM WITHOUT PROBLEMS  . Codeine Nausea Only    Patient Measurements: Height: 5\' 7"  (170.2 cm) Weight: 173 lb 1.6 oz (78.518 kg) (a scale) IBW/kg (Calculated) : 61.6  Vital Signs: Temp: 97.4 F (36.3 C) (08/21 0505) Temp src: Oral (08/21 0505) BP: 144/84 mmHg (08/21 0505) Pulse Rate: 93 (08/21 0505)  Labs:  Recent Labs  11/11/13 0304 11/12/13 0500 11/13/13 0540  HGB 10.6* 11.2*  --   HCT 31.2* 33.5*  --   PLT 62* 81*  --   LABPROT 26.3* 28.8* 26.9*  INR 2.42* 2.71* 2.49*  CREATININE 1.09 1.03  --     Estimated Creatinine Clearance: 51 ml/min (by C-G formula based on Cr of 1.03).  Assessment: 66 yoF presents with generalized weakness, vomiting, and increased confusion. She continues on coumadin for hx of afib.  New amiodarone start which can potentiate effects of coumadin.  INR = 2.49  Goal of Therapy:  INR 2-3   Plan:  1. Repeat Warfarin 2mg  PO x 1 tonight  2. F/u AM INR  Anette Guarneri, PharmD Pager # [phone]  11/13/2013 9:44 AM

## 2013-11-14 ENCOUNTER — Inpatient Hospital Stay (HOSPITAL_COMMUNITY): Payer: Medicare Other

## 2013-11-14 ENCOUNTER — Emergency Department (HOSPITAL_COMMUNITY): Payer: Medicare Other

## 2013-11-14 ENCOUNTER — Encounter (HOSPITAL_COMMUNITY): Payer: Self-pay | Admitting: Emergency Medicine

## 2013-11-14 ENCOUNTER — Inpatient Hospital Stay (HOSPITAL_COMMUNITY)
Admission: EM | Admit: 2013-11-14 | Discharge: 2013-11-19 | DRG: 064 | Disposition: A | Discharge status: Skilled Nursing Facility | Payer: Medicare Other | Attending: Internal Medicine | Admitting: Internal Medicine

## 2013-11-14 DIAGNOSIS — D72829 Elevated white blood cell count, unspecified: Secondary | ICD-10-CM

## 2013-11-14 DIAGNOSIS — I129 Hypertensive chronic kidney disease with stage 1 through stage 4 chronic kidney disease, or unspecified chronic kidney disease: Secondary | ICD-10-CM | POA: Diagnosis present

## 2013-11-14 DIAGNOSIS — E876 Hypokalemia: Secondary | ICD-10-CM | POA: Diagnosis not present

## 2013-11-14 DIAGNOSIS — Z951 Presence of aortocoronary bypass graft: Secondary | ICD-10-CM | POA: Diagnosis not present

## 2013-11-14 DIAGNOSIS — D649 Anemia, unspecified: Secondary | ICD-10-CM | POA: Diagnosis present

## 2013-11-14 DIAGNOSIS — R627 Adult failure to thrive: Secondary | ICD-10-CM

## 2013-11-14 DIAGNOSIS — I2510 Atherosclerotic heart disease of native coronary artery without angina pectoris: Secondary | ICD-10-CM | POA: Diagnosis present

## 2013-11-14 DIAGNOSIS — I2789 Other specified pulmonary heart diseases: Secondary | ICD-10-CM | POA: Diagnosis present

## 2013-11-14 DIAGNOSIS — J9620 Acute and chronic respiratory failure, unspecified whether with hypoxia or hypercapnia: Secondary | ICD-10-CM | POA: Diagnosis present

## 2013-11-14 DIAGNOSIS — R7402 Elevation of levels of lactic acid dehydrogenase (LDH): Secondary | ICD-10-CM | POA: Diagnosis present

## 2013-11-14 DIAGNOSIS — T826XXA Infection and inflammatory reaction due to cardiac valve prosthesis, initial encounter: Secondary | ICD-10-CM | POA: Diagnosis present

## 2013-11-14 DIAGNOSIS — Z79899 Other long term (current) drug therapy: Secondary | ICD-10-CM

## 2013-11-14 DIAGNOSIS — Z7901 Long term (current) use of anticoagulants: Secondary | ICD-10-CM | POA: Diagnosis not present

## 2013-11-14 DIAGNOSIS — I6350 Cerebral infarction due to unspecified occlusion or stenosis of unspecified cerebral artery: Secondary | ICD-10-CM | POA: Diagnosis present

## 2013-11-14 DIAGNOSIS — Z9861 Coronary angioplasty status: Secondary | ICD-10-CM | POA: Diagnosis not present

## 2013-11-14 DIAGNOSIS — T827XXA Infection and inflammatory reaction due to other cardiac and vascular devices, implants and grafts, initial encounter: Secondary | ICD-10-CM | POA: Diagnosis present

## 2013-11-14 DIAGNOSIS — E43 Unspecified severe protein-calorie malnutrition: Secondary | ICD-10-CM | POA: Diagnosis present

## 2013-11-14 DIAGNOSIS — E785 Hyperlipidemia, unspecified: Secondary | ICD-10-CM | POA: Diagnosis present

## 2013-11-14 DIAGNOSIS — R740 Nonspecific elevation of levels of transaminase and lactic acid dehydrogenase [LDH]: Secondary | ICD-10-CM

## 2013-11-14 DIAGNOSIS — G4733 Obstructive sleep apnea (adult) (pediatric): Secondary | ICD-10-CM | POA: Diagnosis present

## 2013-11-14 DIAGNOSIS — I5031 Acute diastolic (congestive) heart failure: Secondary | ICD-10-CM

## 2013-11-14 DIAGNOSIS — I509 Heart failure, unspecified: Secondary | ICD-10-CM | POA: Diagnosis present

## 2013-11-14 DIAGNOSIS — I5033 Acute on chronic diastolic (congestive) heart failure: Secondary | ICD-10-CM | POA: Diagnosis present

## 2013-11-14 DIAGNOSIS — M329 Systemic lupus erythematosus, unspecified: Secondary | ICD-10-CM | POA: Diagnosis present

## 2013-11-14 DIAGNOSIS — Z8619 Personal history of other infectious and parasitic diseases: Secondary | ICD-10-CM

## 2013-11-14 DIAGNOSIS — J8410 Pulmonary fibrosis, unspecified: Secondary | ICD-10-CM | POA: Diagnosis present

## 2013-11-14 DIAGNOSIS — I639 Cerebral infarction, unspecified: Secondary | ICD-10-CM | POA: Diagnosis not present

## 2013-11-14 DIAGNOSIS — R4701 Aphasia: Secondary | ICD-10-CM | POA: Diagnosis present

## 2013-11-14 DIAGNOSIS — I38 Endocarditis, valve unspecified: Secondary | ICD-10-CM

## 2013-11-14 DIAGNOSIS — R509 Fever, unspecified: Secondary | ICD-10-CM | POA: Diagnosis present

## 2013-11-14 DIAGNOSIS — I4891 Unspecified atrial fibrillation: Secondary | ICD-10-CM | POA: Diagnosis present

## 2013-11-14 DIAGNOSIS — N183 Chronic kidney disease, stage 3 unspecified: Secondary | ICD-10-CM | POA: Diagnosis present

## 2013-11-14 DIAGNOSIS — I5022 Chronic systolic (congestive) heart failure: Secondary | ICD-10-CM

## 2013-11-14 DIAGNOSIS — A0472 Enterocolitis due to Clostridium difficile, not specified as recurrent: Secondary | ICD-10-CM | POA: Diagnosis present

## 2013-11-14 DIAGNOSIS — I482 Chronic atrial fibrillation, unspecified: Secondary | ICD-10-CM

## 2013-11-14 DIAGNOSIS — R7401 Elevation of levels of liver transaminase levels: Secondary | ICD-10-CM | POA: Diagnosis present

## 2013-11-14 DIAGNOSIS — Z66 Do not resuscitate: Secondary | ICD-10-CM | POA: Diagnosis present

## 2013-11-14 DIAGNOSIS — Y831 Surgical operation with implant of artificial internal device as the cause of abnormal reaction of the patient, or of later complication, without mention of misadventure at the time of the procedure: Secondary | ICD-10-CM | POA: Diagnosis present

## 2013-11-14 DIAGNOSIS — Z6827 Body mass index (BMI) 27.0-27.9, adult: Secondary | ICD-10-CM | POA: Diagnosis not present

## 2013-11-14 DIAGNOSIS — R197 Diarrhea, unspecified: Secondary | ICD-10-CM | POA: Diagnosis present

## 2013-11-14 DIAGNOSIS — Z792 Long term (current) use of antibiotics: Secondary | ICD-10-CM

## 2013-11-14 DIAGNOSIS — T826XXS Infection and inflammatory reaction due to cardiac valve prosthesis, sequela: Secondary | ICD-10-CM

## 2013-11-14 LAB — CBC WITH DIFFERENTIAL/PLATELET
BASOS PCT: 0 % (ref 0–1)
Basophils Absolute: 0 10*3/uL (ref 0.0–0.1)
EOS ABS: 0.1 10*3/uL (ref 0.0–0.7)
EOS PCT: 1 % (ref 0–5)
HEMATOCRIT: 39.3 % (ref 36.0–46.0)
HEMOGLOBIN: 12.5 g/dL (ref 12.0–15.0)
LYMPHS ABS: 1.7 10*3/uL (ref 0.7–4.0)
Lymphocytes Relative: 11 % — ABNORMAL LOW (ref 12–46)
MCH: 30.9 pg (ref 26.0–34.0)
MCHC: 31.8 g/dL (ref 30.0–36.0)
MCV: 97 fL (ref 78.0–100.0)
MONO ABS: 1.4 10*3/uL — AB (ref 0.1–1.0)
Monocytes Relative: 9 % (ref 3–12)
Neutro Abs: 11.7 10*3/uL — ABNORMAL HIGH (ref 1.7–7.7)
Neutrophils Relative %: 79 % — ABNORMAL HIGH (ref 43–77)
Platelets: 145 10*3/uL — ABNORMAL LOW (ref 150–400)
RBC: 4.05 MIL/uL (ref 3.87–5.11)
RDW: 18.3 % — ABNORMAL HIGH (ref 11.5–15.5)
WBC: 14.9 10*3/uL — ABNORMAL HIGH (ref 4.0–10.5)

## 2013-11-14 LAB — I-STAT ARTERIAL BLOOD GAS, ED
Acid-base deficit: 6 mmol/L — ABNORMAL HIGH (ref 0.0–2.0)
Bicarbonate: 15.8 mEq/L — ABNORMAL LOW (ref 20.0–24.0)
O2 Saturation: 94 %
PCO2 ART: 24.7 mmHg — AB (ref 35.0–45.0)
PO2 ART: 72 mmHg — AB (ref 80.0–100.0)
TCO2: 17 mmol/L (ref 0–100)
pH, Arterial: 7.421 (ref 7.350–7.450)

## 2013-11-14 LAB — TROPONIN I

## 2013-11-14 LAB — URINALYSIS, ROUTINE W REFLEX MICROSCOPIC
BILIRUBIN URINE: NEGATIVE
Glucose, UA: NEGATIVE mg/dL
Hgb urine dipstick: NEGATIVE
Ketones, ur: NEGATIVE mg/dL
Leukocytes, UA: NEGATIVE
Nitrite: NEGATIVE
Protein, ur: 30 mg/dL — AB
SPECIFIC GRAVITY, URINE: 1.017 (ref 1.005–1.030)
UROBILINOGEN UA: 0.2 mg/dL (ref 0.0–1.0)
pH: 5.5 (ref 5.0–8.0)

## 2013-11-14 LAB — COMPREHENSIVE METABOLIC PANEL
ALBUMIN: 3.3 g/dL — AB (ref 3.5–5.2)
ALT: 308 U/L — ABNORMAL HIGH (ref 0–35)
ANION GAP: 18 — AB (ref 5–15)
AST: 54 U/L — ABNORMAL HIGH (ref 0–37)
Alkaline Phosphatase: 144 U/L — ABNORMAL HIGH (ref 39–117)
BUN: 40 mg/dL — ABNORMAL HIGH (ref 6–23)
CO2: 15 mEq/L — ABNORMAL LOW (ref 19–32)
CREATININE: 1.08 mg/dL (ref 0.50–1.10)
Calcium: 9.3 mg/dL (ref 8.4–10.5)
Chloride: 106 mEq/L (ref 96–112)
GFR calc Af Amer: 57 mL/min — ABNORMAL LOW (ref >90)
GFR calc non Af Amer: 49 mL/min — ABNORMAL LOW (ref >90)
Glucose, Bld: 76 mg/dL (ref 70–99)
Potassium: 5.7 mEq/L — ABNORMAL HIGH (ref 3.7–5.3)
Sodium: 139 mEq/L (ref 137–147)
TOTAL PROTEIN: 7.1 g/dL (ref 6.0–8.3)
Total Bilirubin: 2.3 mg/dL — ABNORMAL HIGH (ref 0.3–1.2)

## 2013-11-14 LAB — BASIC METABOLIC PANEL
ANION GAP: 18 — AB (ref 5–15)
BUN: 42 mg/dL — ABNORMAL HIGH (ref 6–23)
CALCIUM: 9.1 mg/dL (ref 8.4–10.5)
CO2: 15 mEq/L — ABNORMAL LOW (ref 19–32)
CREATININE: 1.15 mg/dL — AB (ref 0.50–1.10)
Chloride: 108 mEq/L (ref 96–112)
GFR calc non Af Amer: 45 mL/min — ABNORMAL LOW (ref >90)
GFR, EST AFRICAN AMERICAN: 53 mL/min — AB (ref >90)
Glucose, Bld: 74 mg/dL (ref 70–99)
Potassium: 4.8 mEq/L (ref 3.7–5.3)
SODIUM: 141 meq/L (ref 137–147)

## 2013-11-14 LAB — URINE MICROSCOPIC-ADD ON

## 2013-11-14 LAB — PRO B NATRIURETIC PEPTIDE: Pro B Natriuretic peptide (BNP): 13807 pg/mL — ABNORMAL HIGH (ref 0–450)

## 2013-11-14 LAB — PROTIME-INR
INR: 2.68 — ABNORMAL HIGH (ref 0.00–1.49)
Prothrombin Time: 28.5 seconds — ABNORMAL HIGH (ref 11.6–15.2)

## 2013-11-14 LAB — LACTIC ACID, PLASMA: Lactic Acid, Venous: 2.1 mmol/L (ref 0.5–2.2)

## 2013-11-14 IMAGING — CR DG CHEST 1V PORT
1 series · 1 of 1 positions shown · non-contrast
Comparison: 07/20/2012.

CLINICAL DATA: Post line placement.

PORTABLE CHEST - 1 VIEW

[AP]
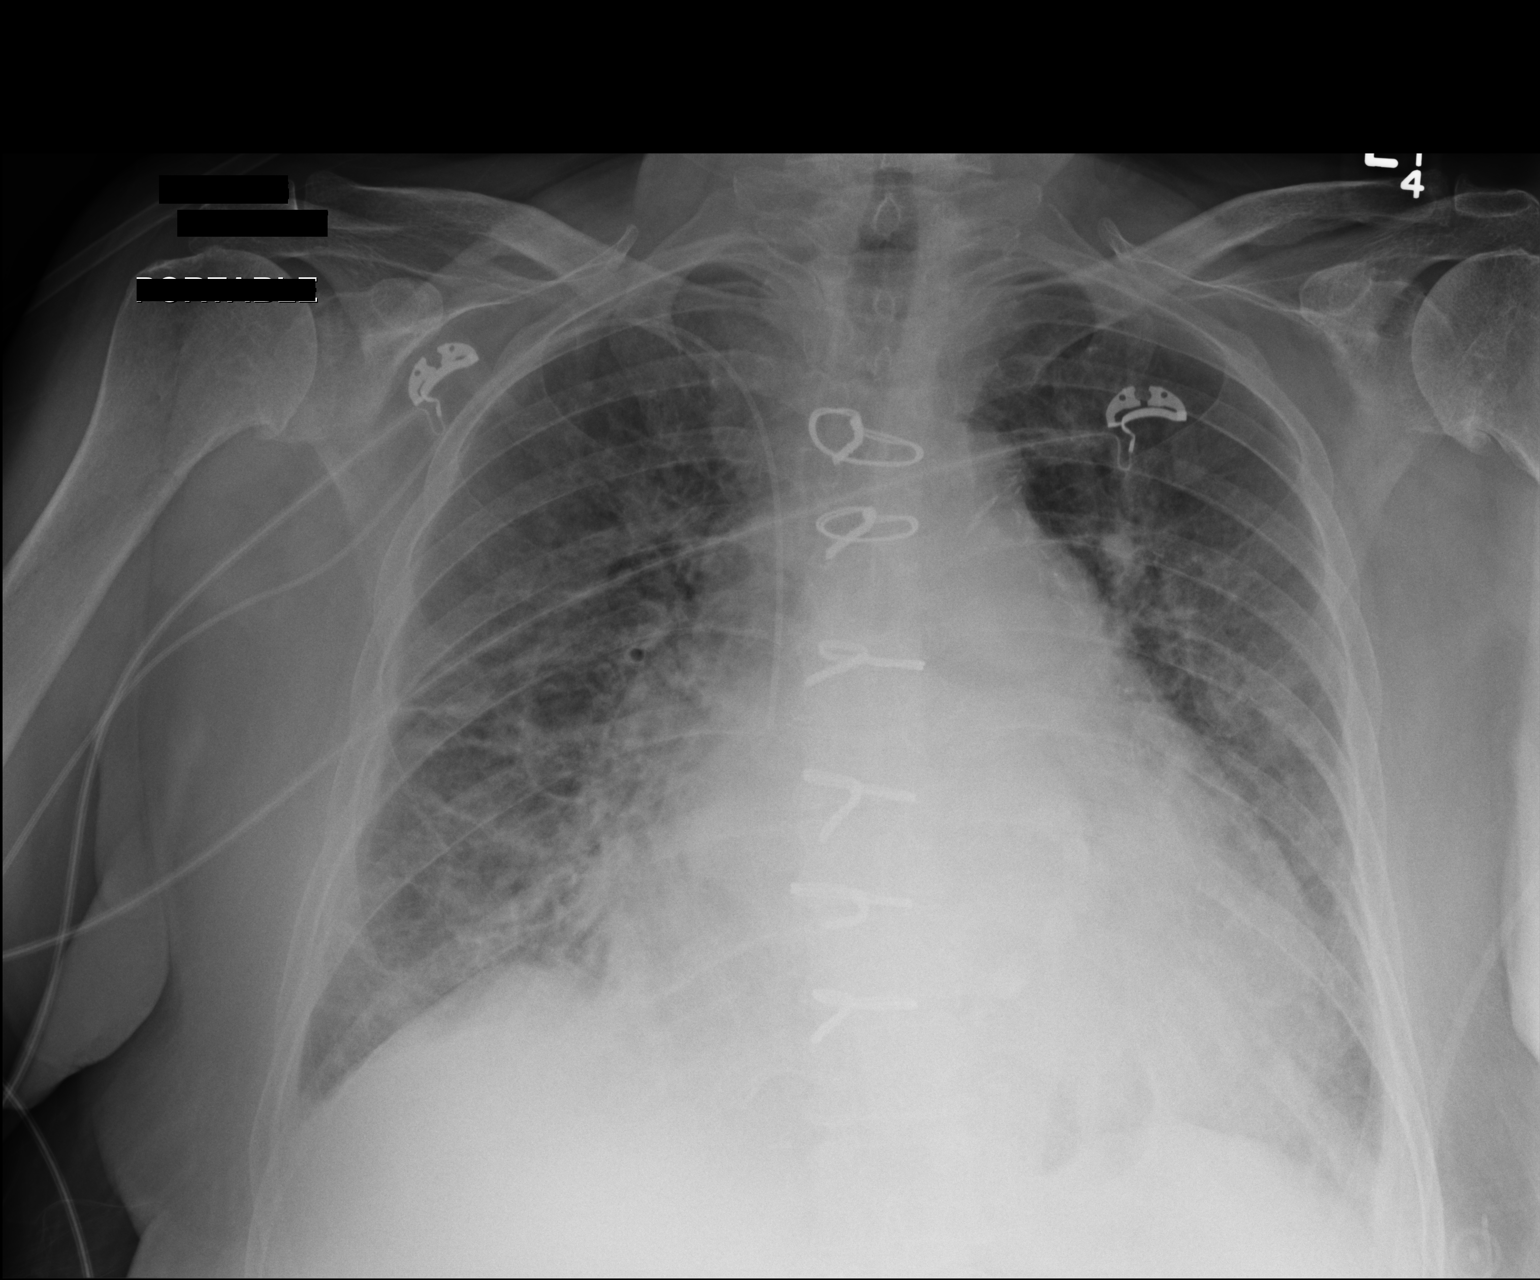

[1 of 1 positions shown; findings below may reference images not displayed]

FINDINGS: Trachea is midline.  Heart is enlarged, stable.  Right
PICC tip projects over the SVC.  There is mild diffuse bilateral
air space disease, possibly slightly improved in the right lower
lung zone.  Small bilateral pleural effusions.
IMPRESSION: Congestive heart failure with slight interval improvement.

## 2013-11-14 MED ORDER — AMIODARONE HCL 200 MG PO TABS
200.0000 mg | ORAL_TABLET | Freq: Every day | ORAL | Status: DC
  Administered 2013-11-14 – 2013-11-19 (×6): 200 mg via ORAL
  Filled 2013-11-14 (×6): qty 1

## 2013-11-14 MED ORDER — ONDANSETRON HCL 4 MG PO TABS: 4.0000 mg | ORAL_TABLET | Freq: Four times a day (QID) | ORAL | Status: DC | PRN

## 2013-11-14 MED ORDER — GABAPENTIN 300 MG PO CAPS
300.0000 mg | ORAL_CAPSULE | Freq: Every day | ORAL | Status: DC
  Administered 2013-11-14 – 2013-11-18 (×5): 300 mg via ORAL
  Filled 2013-11-14 (×6): qty 1

## 2013-11-14 MED ORDER — DEXTROSE 5 % IV SOLN
5.0000 mg/h | INTRAVENOUS | Status: DC
  Administered 2013-11-14: 5 mg/h via INTRAVENOUS

## 2013-11-14 MED ORDER — AMOXICILLIN 500 MG PO CAPS: 500.0000 mg | ORAL_CAPSULE | Freq: Two times a day (BID) | ORAL | Status: DC

## 2013-11-14 MED ORDER — ONDANSETRON HCL 4 MG/2ML IJ SOLN: 4.0000 mg | Freq: Three times a day (TID) | INTRAMUSCULAR | Status: AC | PRN

## 2013-11-14 MED ORDER — FUROSEMIDE 10 MG/ML IJ SOLN
20.0000 mg | Freq: Every day | INTRAMUSCULAR | Status: DC
  Administered 2013-11-14 – 2013-11-15 (×2): 20 mg via INTRAVENOUS
  Filled 2013-11-14: qty 4

## 2013-11-14 MED ORDER — PIPERACILLIN-TAZOBACTAM 3.375 G IVPB
3.3750 g | Freq: Three times a day (TID) | INTRAVENOUS | Status: DC
  Administered 2013-11-15 (×2): 3.375 g via INTRAVENOUS
  Filled 2013-11-14 (×7): qty 50

## 2013-11-14 MED ORDER — WARFARIN SODIUM 2 MG PO TABS
2.0000 mg | ORAL_TABLET | Freq: Once | ORAL | Status: AC
  Administered 2013-11-14: 2 mg via ORAL
  Filled 2013-11-14: qty 1

## 2013-11-14 MED ORDER — WARFARIN - PHARMACIST DOSING INPATIENT
Freq: Every day | Status: DC
  Administered 2013-11-15: 21:00:00

## 2013-11-14 MED ORDER — SODIUM CHLORIDE 0.9 % IV SOLN
1500.0000 mg | Freq: Once | INTRAVENOUS | Status: AC
  Administered 2013-11-14: 1500 mg via INTRAVENOUS
  Filled 2013-11-14: qty 1500

## 2013-11-14 MED ORDER — GADOBENATE DIMEGLUMINE 529 MG/ML IV SOLN
15.0000 mL | Freq: Once | INTRAVENOUS | Status: AC | PRN
  Administered 2013-11-14: 14 mL via INTRAVENOUS

## 2013-11-14 MED ORDER — STROKE: EARLY STAGES OF RECOVERY BOOK
Freq: Once | Status: AC
  Administered 2013-11-15: 06:00:00
  Filled 2013-11-14: qty 1

## 2013-11-14 MED ORDER — VANCOMYCIN HCL IN DEXTROSE 750-5 MG/150ML-% IV SOLN
750.0000 mg | Freq: Two times a day (BID) | INTRAVENOUS | Status: DC
  Administered 2013-11-15: 750 mg via INTRAVENOUS
  Filled 2013-11-14 (×4): qty 150

## 2013-11-14 MED ORDER — AMIODARONE HCL 200 MG PO TABS
200.0000 mg | ORAL_TABLET | Freq: Every day | ORAL | Status: DC
  Filled 2013-11-14: qty 1

## 2013-11-14 MED ORDER — ATORVASTATIN CALCIUM 20 MG PO TABS
20.0000 mg | ORAL_TABLET | Freq: Every day | ORAL | Status: DC
  Administered 2013-11-14 – 2013-11-18 (×5): 20 mg via ORAL
  Filled 2013-11-14: qty 2
  Filled 2013-11-14 (×5): qty 1

## 2013-11-14 MED ORDER — ONDANSETRON HCL 4 MG/2ML IJ SOLN: 4.0000 mg | Freq: Four times a day (QID) | INTRAMUSCULAR | Status: DC | PRN

## 2013-11-14 MED ORDER — FAMOTIDINE 20 MG PO TABS
20.0000 mg | ORAL_TABLET | Freq: Two times a day (BID) | ORAL | Status: DC
  Administered 2013-11-14 – 2013-11-16 (×5): 20 mg via ORAL
  Filled 2013-11-14 (×6): qty 1

## 2013-11-14 MED ORDER — SACCHAROMYCES BOULARDII 250 MG PO CAPS
250.0000 mg | ORAL_CAPSULE | Freq: Two times a day (BID) | ORAL | Status: DC
  Administered 2013-11-14 – 2013-11-19 (×10): 250 mg via ORAL
  Filled 2013-11-14 (×12): qty 1

## 2013-11-14 MED ORDER — MIRTAZAPINE 7.5 MG PO TABS
7.5000 mg | ORAL_TABLET | Freq: Every day | ORAL | Status: DC
  Administered 2013-11-14 – 2013-11-18 (×5): 7.5 mg via ORAL
  Filled 2013-11-14 (×7): qty 1

## 2013-11-14 MED ORDER — METOPROLOL TARTRATE 25 MG PO TABS
25.0000 mg | ORAL_TABLET | Freq: Every day | ORAL | Status: DC
  Administered 2013-11-14 – 2013-11-19 (×6): 25 mg via ORAL
  Filled 2013-11-14 (×6): qty 1

## 2013-11-14 MED ORDER — ACETAMINOPHEN 325 MG PO TABS
650.0000 mg | ORAL_TABLET | Freq: Once | ORAL | Status: AC
  Administered 2013-11-14: 650 mg via ORAL
  Filled 2013-11-14: qty 2

## 2013-11-14 MED ORDER — HYDROXYCHLOROQUINE SULFATE 200 MG PO TABS
200.0000 mg | ORAL_TABLET | Freq: Two times a day (BID) | ORAL | Status: DC
  Administered 2013-11-14 – 2013-11-19 (×10): 200 mg via ORAL
  Filled 2013-11-14 (×12): qty 1

## 2013-11-14 NOTE — Consult Note (Signed)
Referring Physician: Venia Minks    Chief Complaint: Acute onset of inability to speak.  HPI: [name] is an 73 y.o. female history Maj. fibrillation on Coumadin with therapeutic INR, coronary artery disease, hyperlipidemia, hypertension, lupus, and endocarditis, who developed acute onset of inability to speak this afternoon. She was last seen well at 1420 today. At that time she was talking and interacting with staff and family members. She was noted to get 1600 to not be able to speak or follow commands. INR today was 2.68. CT scan of her head showed no acute intracranial abnormality. NIH stroke score was 17. Patient improved and was able to move extremities and follow commands following CT scan. She still is unable to speak however.  LSN: [id number] 11/14/2013 tPA Given: No: INR 2.68, and improved deficits. MRankin: 3  Past Medical History  Diagnosis Date  . Coronary artery disease   . Atrial fibrillation     Now NSR  . GERD (gastroesophageal reflux disease)   . Hyperlipidemia   . 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 Anemia   . Angina   . Hypertension   . Blood transfusion     "related to bad case of bronchitis once; 1 yr ago given due to GIB" (12/17/2012)  . H/O hiatal hernia   . Lupus     "that's compromised her lungs somewhat" (12/17/2012)  . Insomnia   . Endocarditis   . Prosthetic valve endocarditis   . Enterococcal bacteremia   . Heart murmur   . CHF (congestive heart failure)   . Pneumonia     "once or twice" (12/17/2012)  . Chronic bronchitis     "used to get it once/yr; hasn't had it since ~ 1986" (12/17/2012)  . Shortness of breath     "all the times sometimes" (12/17/2012)  . On home oxygen therapy     "2L prn; always at night" (12/17/2012)  . Lower GI bleed ~ 01/2012  . Arthritis     "hands" (12/17/2012)  . 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 Right bundle branch block 11/07/2013    Family History  Problem  Relation Age of Onset  . Heart disease Mother     CHF  . Mental illness Father     suicide  . Cancer Sister     pancreatic cancer  . COPD Brother      Medications: I have reviewed the patient's current medications.   Physical Examination: Blood pressure 133/76, pulse 100, temperature 97.6 F (36.4 C), temperature source Oral, resp. rate 20, height 5\' 6"  (1.676 m), weight 78.3 kg (172 lb 9.9 oz), SpO2 94.00%.  Neurologic Examination: Mental Status: Lethargic and in no acute distress.  Patient had no speech output although she may clear attempts to speak. She was able to follow simple commands with use of extremities. Cranial Nerves: II-Visual fields were normal. III/IV/VI-Pupils were equal and reacted. Extraocular movements were full and conjugate with no gaze preference.    VII-no facial numbness and no facial weakness. VIII-normal. X-no speech output. Motor: Able to move all 4 extremities equally, including to command. Sensory: Unable to adequately assess Deep Tendon Reflexes: 1+ and symmetric. Plantars: Mute bilaterally Cerebellar: Unable to assess.  Ct Head Wo Contrast  11/14/2013   CLINICAL DATA:  73 year old female code stroke. Unresponsive with mental status change. Initial encounter.  EXAM: CT HEAD WITHOUT CONTRAST  TECHNIQUE: Contiguous axial images were obtained from  the base of the skull through the vertex without intravenous contrast.  COMPARISON:  Head and cervical spine CT 1039 hr the same day and earlier.  FINDINGS: Stable paranasal sinuses and mastoids. No acute osseous abnormality identified. Stable orbit and scalp soft tissues.  Calcified atherosclerosis at the skull base. 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 Left thalamic and right caudate lacunar infarcts also are stable. Patchy and confluent cerebral white matter hypodensity also has not significantly changed. Scattered small lacunar infarcts in both  cerebellar hemispheres also appear stable. No midline shift, mass effect, or evidence of intracranial mass lesion. No acute intracranial hemorrhage identified. No evidence of cortically based acute infarction identified. No suspicious intracranial vascular hyperdensity.  IMPRESSION: 1. Stable non contrast CT appearance of the brain. No new intracranial abnormality identified. 2. Chronic ischemic disease detailed above. Study discussed by telephone with Dr. Wallie Char on 11/14/2013 at 1718 hrs.   Electronically Signed   By: Lars Pinks M.D.   On: 11/14/2013 17:22   Ct Head Wo Contrast  11/14/2013   CLINICAL DATA:  Fall earlier today. Now altered mental status and lethargy. Patient on Coumadin.  EXAM: CT HEAD WITHOUT CONTRAST  CT CERVICAL SPINE WITHOUT CONTRAST  TECHNIQUE: Multidetector CT imaging of the head and cervical spine was performed following the standard protocol without intravenous contrast. Multiplanar CT image reconstructions of the cervical spine were also generated.  COMPARISON:  Prior CT scan of the head and cervical spine 06/27/2013  FINDINGS: CT HEAD FINDINGS  Negative for acute intracranial hemorrhage, acute infarction, mass, mass effect, hydrocephalus or midline shift. Gray-white differentiation is preserved throughout. A stable appearance of atrophy, prior right parietal infarct, prior right occipital infarct and extensive sequelae of longstanding chronic microvascular ischemic white matter disease. Stable ventricular configuration. No acute scalp hematoma or calvarial fracture. Normal aeration of the mastoid air cells. Mild mucoperiosteal thickening in the inferior aspect of the maxillary sinuses. Atherosclerosis in the bilateral cavernous carotid arteries.  CT CERVICAL SPINE FINDINGS  No acute fracture, malalignment or prevertebral soft tissue swelling. Multilevel degenerative disc disease. Disc space narrowing most prominent at C5-C6. Multilevel facet arthropathy on the right at C3-C4 and  C4-C5. The is Unremarkable CT appearance of the thyroid gland. No acute soft tissue abnormality. The lung apices are unremarkable.  IMPRESSION: CT HEAD  1. No acute intracranial abnormality. 2. Stable atrophy, remote infarcts involving the right parietal and right occipital regions and advanced chronic microvascular ischemic white matter disease. CT CSPINE  1. No acute fracture or malalignment. 2. Multilevel cervical spondylosis and right-sided facet arthropathy.   Electronically Signed   By: Jacqulynn Cadet M.D.   On: 11/14/2013 11:34   Ct Cervical Spine Wo Contrast  11/14/2013   CLINICAL DATA:  Fall earlier today. Now altered mental status and lethargy. Patient on Coumadin.  EXAM: CT HEAD WITHOUT CONTRAST  CT CERVICAL SPINE WITHOUT CONTRAST  TECHNIQUE: Multidetector CT imaging of the head and cervical spine was performed following the standard protocol without intravenous contrast. Multiplanar CT image reconstructions of the cervical spine were also generated.  COMPARISON:  Prior CT scan of the head and cervical spine 06/27/2013  FINDINGS: CT HEAD FINDINGS  Negative for acute intracranial hemorrhage, acute infarction, mass, mass effect, hydrocephalus or midline shift. Gray-white differentiation is preserved throughout. A stable appearance of atrophy, prior right parietal infarct, prior right occipital infarct and extensive sequelae of longstanding chronic microvascular ischemic white matter disease. Stable ventricular configuration. No acute scalp  hematoma or calvarial fracture. Normal aeration of the mastoid air cells. Mild mucoperiosteal thickening in the inferior aspect of the maxillary sinuses. Atherosclerosis in the bilateral cavernous carotid arteries.  CT CERVICAL SPINE FINDINGS  No acute fracture, malalignment or prevertebral soft tissue swelling. Multilevel degenerative disc disease. Disc space narrowing most prominent at C5-C6. Multilevel facet arthropathy on the right at C3-C4 and C4-C5. The is  Unremarkable CT appearance of the thyroid gland. No acute soft tissue abnormality. The lung apices are unremarkable.  IMPRESSION: CT HEAD  1. No acute intracranial abnormality. 2. Stable atrophy, remote infarcts involving the right parietal and right occipital regions and advanced chronic microvascular ischemic white matter disease. CT CSPINE  1. No acute fracture or malalignment. 2. Multilevel cervical spondylosis and right-sided facet arthropathy.   Electronically Signed   By: Jacqulynn Cadet M.D.   On: 11/14/2013 11:34   Dg Chest Portable 1 View  11/14/2013   CLINICAL DATA:  Altered mental status  EXAM: PORTABLE CHEST - 1 VIEW  COMPARISON:  Prior chest x-ray 11/07/2013  FINDINGS: Stable cardiomegaly. Patient is status post median sternotomy with evidence of prior CABG. Patient is status post median sternotomy with evidence of prior CABG. Atherosclerotic calcifications present within the transverse aorta. Is slightly lower inspiratory volumes.a increased pulmonary vascular congestion now with mild interstitial edema. Kerley B-lines are noted in the periphery of the right lung. No focal airspace consolidation, pleural effusion or pneumothorax. No acute osseous abnormality.  IMPRESSION: 1. Interval development of mild interstitial edema concerning for mild CHF.   Electronically Signed   By: Jacqulynn Cadet M.D.   On: 11/14/2013 10:20    Assessment: 73 y.o. 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  Stroke Risk Factors - atrial fibrillation and hypertension  Plan: 1. HgbA1c, fasting lipid panel 2. MRI, MRA  of the brain without contrast 3. PT consult, OT consult, Speech consult 4. Echocardiogram 5. Carotid dopplers 6. Prophylactic therapy-Anticoagulation: Coumadin 7. Risk factor modification 8. Telemetry monitoring   C.R. Nicole Kindred, MD [facility] [phone]  11/14/2013, 5:58 PM

## 2013-11-14 NOTE — ED Notes (Signed)
Family at the bedside updated on delay.

## 2013-11-14 NOTE — H&P (Signed)
[facility] History and Physical  [name] RXV:[id number] DOB: [birth year] DOA: 11/14/2013  Referring physician: EDP PCP: Hollace Kinnier, DO   Chief Complaint: brought in by family members for a fall at home  HPI: [name] is a 73 y.o. female with prior h/o recurrent endocarditis, on amoxicillin for endocarditis prophylaxis, atrial fibrillation on coumadin, CAD, pulmonary fibrosis, h/o c diff colitis,  anemia, chronic diastolic heart failure, discharged yesterday, was brought by her daughter after she was found on the floor near the bed . Most of the history available from the daughters at bedside. The daughter who lives with the patient reports she heard a sound in the room and found her lying beside the bed. She was also found to be in sob, wheezing and gasping for air. Her oxygen sats were in low 80's. On arrival to ED, her rectal temp was 101, she was found to have some leukocytosis on lab work. Her CXR and UA did not reveal a source of infection. But she had two loose bm's since yesterday. No rash seen on the skin.    Review of Systems:  Could not be obtained.    Past Medical History  Diagnosis Date  . Coronary artery disease   . Atrial fibrillation     Now NSR  . GERD (gastroesophageal reflux disease)   . Hyperlipidemia   . 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 Anemia   . Angina   . Hypertension   . Blood transfusion     "related to bad case of bronchitis once; 1 yr ago given due to GIB" (12/17/2012)  . H/O hiatal hernia   . Lupus     "that's compromised her lungs somewhat" (12/17/2012)  . Insomnia   . Endocarditis   . Prosthetic valve endocarditis   . Enterococcal bacteremia   . Heart murmur   . CHF (congestive heart failure)   . Pneumonia     "once or twice" (12/17/2012)  . Chronic bronchitis     "used to get it once/yr; hasn't had it since ~ 1986" (12/17/2012)  . Shortness of breath     "all the times sometimes" (12/17/2012)  .  On home oxygen therapy     "2L prn; always at night" (12/17/2012)  . Lower GI bleed ~ 01/2012  . Arthritis     "hands" (12/17/2012)  . 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 Right bundle branch block 11/07/2013   Past Surgical History  Procedure Laterality Date  . Givens capsule study  04/09/2011    Procedure: GIVENS CAPSULE STUDY;  Surgeon: Beryle Beams, MD;  Location: [facility];  Service: Endoscopy;  Laterality: N/A;  . Tee without cardioversion  02/06/2012    Procedure: TRANSESOPHAGEAL ECHOCARDIOGRAM (TEE);  Surgeon: Birdie Riddle, MD;  Location: [facility] ENDOSCOPY;  Service: Cardiovascular;  Laterality: N/A;  . Esophagogastroduodenoscopy  02/12/2012    Procedure: ESOPHAGOGASTRODUODENOSCOPY (EGD);  Surgeon: Beryle Beams, MD;  Location: [facility] ENDOSCOPY;  Service: Endoscopy;  Laterality: N/A;  . Tee without cardioversion N/A 07/15/2012    Procedure: TRANSESOPHAGEAL ECHOCARDIOGRAM (TEE);  Surgeon: Birdie Riddle, MD;  Location: [facility] ENDOSCOPY;  Service: Cardiovascular;  Laterality: N/A;  . Breast lumpectomy Left 1977    "benign" (12/17/2012)  . Tonsillectomy  1940's  . Coronary artery bypass graft  2006  . 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   . Cardiac catheterization      "  probably 2-3" (12/17/2012)  . Coronary angioplasty with stent placement      "several put in over the years" (12/17/2012)  . Cataract extraction w/ intraocular lens  implant, bilateral Bilateral 11/2000  . Peripherally inserted central catheter insertion Right 12/15/2012    "upper arm" (12/17/2012   Social History:  reports that she has never smoked. She has never used smokeless tobacco. She reports that she does not drink alcohol or use illicit drugs.  Allergies  Allergen Reactions  . Ceftriaxone     RASH BUT TOLERATED AMPICILLIN AND IMIPENEM WITHOUT PROBLEMS  . Codeine Nausea Only    Family History  Problem Relation Age of Onset  . Heart disease Mother       CHF  . Mental illness Father     suicide  . Cancer Sister     pancreatic cancer  . COPD Brother      Prior to Admission medications   Medication Sig Start Date End Date Taking? Authorizing Provider  amiodarone (PACERONE) 200 MG tablet Take 1 tablet (200 mg total) by mouth daily. 11/13/13  Yes Hosie Poisson, MD  amoxicillin (AMOXIL) 500 MG capsule Take 1 capsule (500 mg total) by mouth every 12 (twelve) hours. 09/15/13  Yes Barton Dubois, MD  buprenorphine (BUTRANS) 5 MCG/HR PTWK patch Place 1 patch (5 mcg total) onto the skin once a week. 07/03/13  Yes Tiffany L Reed, DO  famotidine (PEPCID) 20 MG tablet Take 1 tablet (20 mg total) by mouth 2 (two) times daily. 10/27/13  Yes Estill Dooms, MD  furosemide (LASIX) 20 MG tablet Take 1 tablet (20 mg total) by mouth daily. 11/13/13  Yes Hosie Poisson, MD  gabapentin (NEURONTIN) 300 MG capsule Take 1 capsule (300 mg total) by mouth at bedtime. 10/27/13  Yes Estill Dooms, MD  hydroxychloroquine (PLAQUENIL) 200 MG tablet Take 200 mg by mouth 2 (two) times daily.    Yes Historical Provider, MD  IRON PO Take 1 tablet by mouth 2 (two) times daily.   Yes Historical Provider, MD  metoprolol tartrate (LOPRESSOR) 25 MG tablet Take 25 mg by mouth daily.   Yes Historical Provider, MD  mirtazapine (REMERON) 7.5 MG tablet Take 1 tablet (7.5 mg total) by mouth at bedtime. For appetite 08/31/13  Yes Tiffany L Reed, DO  potassium chloride SA (K-DUR,KLOR-CON) 20 MEQ tablet Take 40-60 mEq by mouth 2 (two) times daily. Take 40 MEQ by mouth in the morning and take 60 MEQ by mouth in the evening.   Yes Historical Provider, MD  rosuvastatin (CRESTOR) 10 MG tablet Take 10 mg by mouth every morning.    Yes Historical Provider, MD  saccharomyces boulardii (FLORASTOR) 250 MG capsule Take 1 capsule (250 mg total) by mouth 2 (two) times daily. 08/31/13  Yes Tiffany L Reed, DO  warfarin (COUMADIN) 4 MG tablet Take 4 mg by mouth daily.   Yes Historical Provider, MD   Physical  Exam: Filed Vitals:   11/14/13 1130 11/14/13 1201 11/14/13 1215 11/14/13 1245  BP: 130/73 157/86 130/88 118/64  Pulse: 103 132 107 92  Temp: 96.6 F (35.9 C) 98.1 F (36.7 C) 99.1 F (37.3 C) 98.8 F (37.1 C)  TempSrc:  Core (Comment)    Resp: 20 22 20 23   Height:      Weight:      SpO2: 92% 95% 94% 92%    Wt Readings from Last 3 Encounters:  11/14/13 78.472 kg (173 lb)  11/13/13 78.518 kg (173 lb 1.6 oz)  09/28/13 68.04 kg (150 lb)    General:  Appears calm and comfortable Eyes: PERRL, normal lids, irises & conjunctiva Neck: no LAD, masses or thyromegaly Cardiovascular: irregular. No LE edema. Respiratory: CTA bilaterally, no w/r/r. Normal respiratory effort. Abdomen: soft, ntnd Skin: no rash or induration seen on limited exam Musculoskeletal: grossly normal tone BUE/BLE Neurologic: aphasic, able to move her upper extremities very somnolent.           Labs on Admission:  Basic Metabolic Panel:  Recent Labs Lab 11/10/13 0234 11/11/13 0304 11/12/13 0500 11/14/13 0923 11/14/13 1301  NA 139 138 139 139 141  K 3.9 3.9 4.5 5.7* 4.8  CL 109 107 109 106 108  CO2 15* 17* 16* 15* 15*  GLUCOSE 170* 145* 130* 76 74  BUN 26* 29* 34* 40* 42*  CREATININE 1.18* 1.09 1.03 1.08 1.15*  CALCIUM 8.8 9.3 9.4 9.3 9.1   Liver Function Tests:  Recent Labs Lab 11/09/13 0947 11/10/13 0234 11/11/13 0304 11/12/13 0500 11/14/13 0923  AST 562* 301* 142* 83* 54*  ALT 1006* 812* 603* 473* 308*  ALKPHOS 119* 133* 146* 150* 144*  BILITOT 2.1* 1.4* 1.2 1.1 2.3*  PROT 5.8* 5.8* 6.1 6.2 7.1  ALBUMIN 2.5* 2.4* 2.6* 2.7* 3.3*   No results found for this basename: LIPASE, AMYLASE,  in the last 168 hours No results found for this basename: AMMONIA,  in the last 168 hours CBC:  Recent Labs Lab 11/09/13 0947 11/10/13 0234 11/11/13 0304 11/12/13 0500 11/14/13 0923  WBC 6.6 4.2 5.7 6.7 14.9*  NEUTROABS  --   --   --   --  11.7*  HGB 10.7* 9.6* 10.6* 11.2* 12.5  HCT 32.4* 28.8*  31.2* 33.5* 39.3  MCV 95.6 95.0 95.7 96.0 97.0  PLT 60* 49* 62* 81* 145*   Cardiac Enzymes:  Recent Labs Lab 11/14/13 0923  TROPONINI <0.30    BNP (last 3 results)  Recent Labs  09/12/13 1122 11/07/13 1000 11/14/13 0923  PROBNP 16828.0* 24587.0* 13807.0*   CBG: No results found for this basename: GLUCAP,  in the last 168 hours  Radiological Exams on Admission: Ct Head Wo Contrast  11/14/2013   CLINICAL DATA:  Fall earlier today. Now altered mental status and lethargy. Patient on Coumadin.  EXAM: CT HEAD WITHOUT CONTRAST  CT CERVICAL SPINE WITHOUT CONTRAST  TECHNIQUE: Multidetector CT imaging of the head and cervical spine was performed following the standard protocol without intravenous contrast. Multiplanar CT image reconstructions of the cervical spine were also generated.  COMPARISON:  Prior CT scan of the head and cervical spine 06/27/2013  FINDINGS: CT HEAD FINDINGS  Negative for acute intracranial hemorrhage, acute infarction, mass, mass effect, hydrocephalus or midline shift. Gray-white differentiation is preserved throughout. A stable appearance of atrophy, prior right parietal infarct, prior right occipital infarct and extensive sequelae of longstanding chronic microvascular ischemic white matter disease. Stable ventricular configuration. No acute scalp hematoma or calvarial fracture. Normal aeration of the mastoid air cells. Mild mucoperiosteal thickening in the inferior aspect of the maxillary sinuses. Atherosclerosis in the bilateral cavernous carotid arteries.  CT CERVICAL SPINE FINDINGS  No acute fracture, malalignment or prevertebral soft tissue swelling. Multilevel degenerative disc disease. Disc space narrowing most prominent at C5-C6. Multilevel facet arthropathy on the right at C3-C4 and C4-C5. The is Unremarkable CT appearance of the thyroid gland. No acute soft tissue abnormality. The lung apices are unremarkable.  IMPRESSION: CT HEAD  1. No acute intracranial  abnormality. 2. 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 CT CSPINE  1. No acute fracture or malalignment. 2. Multilevel cervical spondylosis and right-sided facet arthropathy.   Electronically Signed   By: Jacqulynn Cadet M.D.   On: 11/14/2013 11:34   Ct Cervical Spine Wo Contrast  11/14/2013   CLINICAL DATA:  Fall earlier today. Now altered mental status and lethargy. Patient on Coumadin.  EXAM: CT HEAD WITHOUT CONTRAST  CT CERVICAL SPINE WITHOUT CONTRAST  TECHNIQUE: Multidetector CT imaging of the head and cervical spine was performed following the standard protocol without intravenous contrast. Multiplanar CT image reconstructions of the cervical spine were also generated.  COMPARISON:  Prior CT scan of the head and cervical spine 06/27/2013  FINDINGS: CT HEAD FINDINGS  Negative for acute intracranial hemorrhage, acute infarction, mass, mass effect, hydrocephalus or midline shift. Gray-white differentiation is preserved throughout. A stable appearance of atrophy, prior right parietal infarct, prior right occipital infarct and extensive sequelae of longstanding chronic microvascular ischemic white matter disease. Stable ventricular configuration. No acute scalp hematoma or calvarial fracture. Normal aeration of the mastoid air cells. Mild mucoperiosteal thickening in the inferior aspect of the maxillary sinuses. Atherosclerosis in the bilateral cavernous carotid arteries.  CT CERVICAL SPINE FINDINGS  No acute fracture, malalignment or prevertebral soft tissue swelling. Multilevel degenerative disc disease. Disc space narrowing most prominent at C5-C6. Multilevel facet arthropathy on the right at C3-C4 and C4-C5. The is Unremarkable CT appearance of the thyroid gland. No acute soft tissue abnormality. The lung apices are unremarkable.  IMPRESSION: CT HEAD  1. No acute intracranial abnormality. 2.  Stable atrophy, remote infarcts involving the right parietal and right occipital regions and advanced chronic microvascular ischemic white matter disease. CT CSPINE  1. No acute fracture or malalignment. 2. Multilevel cervical spondylosis and right-sided facet arthropathy.   Electronically Signed   By: Jacqulynn Cadet M.D.   On: 11/14/2013 11:34   Dg Chest Portable 1 View  11/14/2013   CLINICAL DATA:  Altered mental status  EXAM: PORTABLE CHEST - 1 VIEW  COMPARISON:  Prior chest x-ray 11/07/2013  FINDINGS: Stable cardiomegaly. Patient is status post median sternotomy with evidence of prior CABG. Patient is status post median sternotomy with evidence of prior CABG. Atherosclerotic calcifications present within the transverse aorta. Is slightly lower inspiratory volumes.a increased pulmonary vascular congestion now with mild interstitial edema. Kerley B-lines are noted in the periphery of the right lung. No focal airspace consolidation, pleural effusion or pneumothorax. No acute osseous abnormality.  IMPRESSION: 1. Interval development of mild interstitial edema concerning for mild CHF.   Electronically Signed   By: Jacqulynn Cadet M.D.   On: 11/14/2013 10:20    EKG: not done.   Assessment/Plan Active Problems:   Fever   Fever, shortness of breath with hypoxia, tachycardia and leukocytosis: Will admit to telemetry and empirically treat as sepsis from questionable HCAP. CXR does not show any focal consolidation or pneumonia. UA is negative. Loose stools as per the family twice today. Will send stool for C diff PCR.  Repeat 2 view CXR tomorrow. 2 sets of blood cultures done and results pending. Blood cultures done from last admission negative so far.  Nasal oxygen as needed to keep Oxygen sats > 90%.    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 : Her baseline creatinine is around 1. On admission it is 1.15.  Unclear etiology. Recommended repeating renal parameters in am.     Chronic Atrial fibrillation:  Rate  not well controlled. Resume coumadin as per pharmacy. Resume amiodarone.    Chronic diastolic heart failure:  Sob on arrival with hypoxia, CXR showed mild interstitial edema, no pedal edema . probnp is around 13,807.   Elevated Transaminases: Improving, thought to be secondary to shock from last admission.      Code Status: DNR DVT Prophylaxis: coumadin Family Communication: multiple family members at bedside Disposition Plan: admit to telemetry.   Time spent: 75  Minutes.   [facility] Pager [phone]  **Disclaimer: This note may have been dictated with voice recognition software. Similar sounding words can inadvertently be transcribed and this note may contain transcription errors which may not have been corrected upon publication of note.**  Addendum: Code stroke called when patient not responding to verbal stimuli, and responding only to sternal rub. She is aphasic and appears confused. Stat CT head showed no acute bleed or ischemia. Neurology consulted.  Plan to do stroke work up and transfer her to 4 N. Discussed in detail with family.   Hosie Poisson, MD [phone]

## 2013-11-14 NOTE — ED Notes (Signed)
[name], transporter attempted to pick up patient for CT scans- per RN patient is not ready, will call CT when ready

## 2013-11-14 NOTE — Significant Event (Signed)
Rapid Response Event Note  Overview: Time Called: 1629 Arrival Time: 1632 Event Type: Neurologic  Initial Focused Assessment:  Called by primary RN at request of MD for neuro status change.  As per RN LSN 1420, she was talking and interacting with staff and family.  RN came in room to assess patient at 1600 and noted patient to not be speaking or following commands.  MD at bedside and states this is a change in status. Family also at bedside.  Patient lying in bed on nasal cannula.  Patient appears to be lethargic but arouses to verbal stimuli for brief periods.  On my arrival patient will only give a thumbs up when asked.  Skin warm and dry.   Interventions:  Patient transported to CT scan via bed, oxygen and monitor.  NIHSS on my arrival 69.  Dr Nicole Kindred met [place] in Ct scan.  Upon arrival back  to floor, patient able to move all extremities and following commands.  Still not speaking.     Event Summary:  Family updated by Dr Nicole Kindred   at      at          Kennedy Bucker

## 2013-11-14 NOTE — Progress Notes (Signed)
Patient came to the floor at 1400, alert and oriented, no c/o pain, v/s stable. Around 1600 went to the room, find pt lerthergic, respond speech by open eye, rapid response called, v/s stable, MD was in the room at the time, another ct of head done per order, mri/mra done. MD order to transfer pt. To 4N. Report given to RN on 4N. Will continue to monitor patient  Till transfer.

## 2013-11-14 NOTE — ED Notes (Signed)
Phlebotomy at the bedside for second culture.

## 2013-11-14 NOTE — Progress Notes (Signed)
ANTIBIOTIC/ANTICOAGULANT CONSULT NOTE - FOLLOW UP  Pharmacy Consult for vancomycin, zosyn and coumadin Indication: pneumonia, afib  Allergies  Allergen Reactions  . Ceftriaxone     RASH BUT TOLERATED AMPICILLIN AND IMIPENEM WITHOUT PROBLEMS  . Codeine Nausea Only    Patient Measurements: Height: 5\' 7"  (170.2 cm) Weight: 173 lb (78.472 kg) IBW/kg (Calculated) : 61.6   Vital Signs: Temp: 98.8 F (37.1 C) (08/22 1245) Temp src: Core (Comment) (08/22 1201) BP: 118/64 mmHg (08/22 1245) Pulse Rate: 92 (08/22 1245) Intake/Output from previous day:   Intake/Output from this shift:    Labs:  Recent Labs  11/12/13 0500 11/14/13 0923 11/14/13 1301  WBC 6.7 14.9*  --   HGB 11.2* 12.5  --   PLT 81* 145*  --   CREATININE 1.03 1.08 1.15*   Estimated Creatinine Clearance: 45.6 ml/min (by C-G formula based on Cr of 1.15). No results found for this basename: VANCOTROUGH, Corlis Leak, VANCORANDOM, [place], GENTPEAK, GENTRANDOM, [place], TOBRAPEAK, TOBRARND, AMIKACINPEAK, AMIKACINTROU, AMIKACIN,  in the last 72 hours   Microbiology: Recent Results (from the past 720 hour(s))  URINE CULTURE     Status: None   Collection Time    11/07/13  7:56 AM      Result Value Ref Range Status   Specimen Description URINE, CATHETERIZED   Final   Special Requests NONE   Final   Culture  Setup Time     Final   Value: 11/07/2013 18:34     Performed at [place]     Final   Value: NO GROWTH     Performed at [facility]   Culture     Final   Value: NO GROWTH     Performed at [facility]   Report Status 11/08/2013 FINAL   Final  CULTURE, BLOOD (ROUTINE X 2)     Status: None   Collection Time    11/07/13  8:20 AM      Result Value Ref Range Status   Specimen Description BLOOD LEFT ARM   Final   Special Requests BOTTLES DRAWN AEROBIC ONLY 4CC   Final   Culture  Setup Time     Final   Value: 11/07/2013 18:00     Performed at [facility]   Culture     Final   Value: NO GROWTH 5 DAYS     Performed at [facility]   Report Status 11/13/2013 FINAL   Final  CULTURE, BLOOD (ROUTINE X 2)     Status: None   Collection Time    11/07/13  8:27 AM      Result Value Ref Range Status   Specimen Description BLOOD LEFT HAND   Final   Special Requests BOTTLES DRAWN AEROBIC ONLY 3CC   Final   Culture  Setup Time     Final   Value: 11/07/2013 18:00     Performed at [facility]   Culture     Final   Value: NO GROWTH 5 DAYS     Performed at [facility]   Report Status 11/13/2013 FINAL   Final  MRSA PCR SCREENING     Status: None   Collection Time    11/07/13  2:09 PM      Result Value Ref Range Status   MRSA by PCR NEGATIVE  NEGATIVE Final   Comment:            The GeneXpert MRSA Assay (FDA  approved for NASAL specimens     only), is one component of a     comprehensive MRSA colonization     surveillance program. It is not     intended to diagnose MRSA     infection nor to guide or     monitor treatment for     MRSA infections.    Anti-infectives   Start     Dose/Rate Route Frequency Ordered Stop   11/14/13 1515  piperacillin-tazobactam (ZOSYN) IVPB 3.375 g     3.375 g 12.5 mL/hr over 240 Minutes Intravenous Every 8 hours 11/14/13 1459     11/14/13 1500  vancomycin (VANCOCIN) 1,500 mg in sodium chloride 0.9 % 500 mL IVPB     1,500 mg 250 mL/hr over 120 Minutes Intravenous  Once 11/14/13 1458        Assessment: 73 yo lady to start broad spectrum antibiotics for HCAP and to continue coumadin for afib. She was recently in hospital and now readmitted after falling.    Goal of Therapy:  Vancomycin trough level 15-20 mcg/ml INR 2-3  Plan:  Vancomycin 1500 mg IV x 1 now then 750 mg IV q12 hours Zosyn 3.375 mg IV q8 hours Coumadin 2 mg po X 1 today  Thanks for allowing pharmacy to be a part of this patient's care.  Excell Seltzer, PharmD Clinical Pharmacist, [phone] 11/14/2013,3:10  PM

## 2013-11-14 NOTE — Discharge Summary (Signed)
Physician Discharge Summary  HENNESSY BARTEL MBW:[id number] DOB: [birth year] DOA: 11/07/2013  PCP: Hollace Kinnier, DO  Admit date: 11/07/2013 Discharge date: 11/13/2013  Time spent: 30 minutes  Recommendations for Outpatient Follow-up:  1. Follow up with Dr Terrence Dupont in one week 2. Follow up with Dr Lucianne Lei dam as recommended 3. Follow up with PCP in 2 to 4 weeks.   Discharge Diagnoses:  Active Problems:   CORONARY ARTERY DISEASE   Atrial fibrillation   CORONARY ARTERY BYPASS GRAFT, HX OF   Endocarditis   Protein-calorie malnutrition, severe   CKD (chronic kidney disease) stage 3, GFR 30-59 ml/min   Fever   Discharge Condition: improved.   Diet recommendation: low sodium diet.   Filed Weights   11/14/13 0917  Weight: 78.472 kg (173 lb)    History of present illness:  73 y.o. 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 She had been complaining of weakness and fatigue for several days and had a poor appetite. She had 4 episodes of vomiting, nonbilious and nonbloody.  Patient required BiPAP while in the emergency room due to respiratory acidosis and tachypnea. While in the emergency department, patient was noted to have atrial fibrillation with RVR, and started on diltiazem drip. She was later on transitioned to po diltiazem and started on prophylaxis for recurrent endocarditis.    Hospital Course:  SIRS - suspected severe sepsis with lactic acidosis  -Secondary to unknown source at this time. Upon admission patient had a leukocytosis, and tachycardia. Her Leukocytosis resolved, , afebrile , persistent tachycardia . Possibly secondary to viral gastroenteritis versus recurrent endocarditis . Chest x-ray negative for acute cardiopulmonary process, UA negative for infection . Blood cultures no growth.  Patient currently has no diarrhea or complaints of abdominal pain . Infectious disease, Dr. Linus Salmons, consulted recommended stopping the antibiotics and resume prophylaxis and follow up with Dr Tommy Medal as recommended.   Acute hypoxic respiratory failure  -Possibly secondary to exac of underlying fibrosis  -resolved - stats 90% or > on RA   Chronic Atrial fibrillation with acute RVR  -Continue diltiazem  -Cardiology consulted  -TSH 2.17,  -Continue Coumadin   Acute kidney injury on CAD stage III  -Creatinine baseline less than 1  -Likely secondary to hypoperfusion from acute resp failure/sepsis  -essentially resolved   Hyperkalemia  -Resolved   Elevated transaminases  -Likely secondary to sepsis/shock liver from hypoperfusion secondary to Afib RVR  -Trending downward  -Abdominal ultrasound: no acute abnormality seen within the abdomen  -Continue to monitor LFTs   Nausea and vomiting  -Resolved, Likely related to sepsis versus gastroenteritis   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  -Currently compensated   continue to monitor her daily weights and strict intake and output on discharge  Recurrent enterococcal endocarditis  -Ampicillin restarted on 8/17  -Discontinued daptomycin and Zosyn as blood cultures negative  -Infectious disease consulted  Normocytic Anemia  -Fecal occult was positive, however, patient on coumadin and does have external hemorrhoids, will continue to monitor CBC  -Anemia panel: Iron 86, Ferritin 3177 (likely acute phase reactant)      Procedures:  none  Consultations: ID  Cardiology  Discharge Exam: Filed Vitals:   11/14/13 1245  BP: 118/64  Pulse: 92  Temp: 98.8 F (37.1 C)  Resp: 23    General: alert afebrile comfortable Cardiovascular:  s1s2 Respiratory: ctab  Discharge Instructions You were cared for by a hospitalist during your hospital stay. If you have any questions about your discharge medications or the care you received while you were in the hospital after you are discharged, you can call the unit and asked to speak with the hospitalist on call if the hospitalist that took care of you is not available. Once you are discharged, your primary care physician will handle any further medical issues. Please note that NO REFILLS for any discharge medications will be authorized once you are discharged, as it is imperative that you return to your primary care physician (or establish a relationship with a primary care physician if you do not have one) for your aftercare needs so that they can reassess your need for medications and monitor your lab values.     Medication List    ASK your doctor about these medications       amiodarone 200 MG tablet  Commonly known as:  PACERONE  Take 1 tablet (200 mg total) by mouth daily.     amoxicillin 500 MG capsule  Commonly known as:  AMOXIL  Take 1 capsule (500 mg total) by mouth every 12 (twelve) hours.     buprenorphine 5 MCG/HR Ptwk patch  Commonly known as:  BUTRANS  Place 1 patch (5 mcg total) onto the skin once a week.     famotidine 20 MG tablet  Commonly known as:  PEPCID  Take 1 tablet (20 mg total) by mouth 2 (two) times daily.     furosemide 20 MG tablet  Commonly known as:  LASIX  Take 1 tablet (20 mg total) by mouth daily.     gabapentin 300 MG capsule  Commonly known as:  NEURONTIN  Take 1 capsule (300 mg total) by mouth at bedtime.     hydroxychloroquine 200 MG tablet  Commonly known as:  PLAQUENIL  Take 200 mg by mouth 2 (two) times daily.     IRON PO  Take 1 tablet by mouth 2 (two) times daily.     metoprolol tartrate 25 MG tablet  Commonly known as:  LOPRESSOR  Take 25 mg by mouth daily.     mirtazapine 7.5 MG tablet  Commonly known as:  REMERON   Take 1 tablet (7.5 mg total) by mouth at bedtime. For appetite     potassium chloride SA 20 MEQ tablet  Commonly known as:  K-DUR,KLOR-CON  Take 40-60 mEq by mouth 2 (two) times daily. Take 40 MEQ by mouth in the morning and take 60 MEQ by mouth in the evening.     rosuvastatin 10 MG tablet  Commonly known as:  CRESTOR  Take 10 mg by mouth every morning.     saccharomyces boulardii 250 MG capsule  Commonly known as:  FLORASTOR  Take 1 capsule (250 mg total) by mouth 2 (two) times daily.     warfarin 4 MG tablet  Commonly known as:  COUMADIN  Take 4 mg by mouth daily.       Allergies  Allergen Reactions  . Ceftriaxone     RASH BUT TOLERATED AMPICILLIN AND IMIPENEM WITHOUT PROBLEMS  . Codeine Nausea Only      The results of significant diagnostics from this hospitalization (including imaging, microbiology, ancillary and laboratory) are listed  below for reference.    Significant Diagnostic Studies: US Abdomen Complete  11/07/2013   CLINICAL DATA:  Severe sepsis.  Elevated LFTs.  EXAM: ULTRASOUND ABDOMEN COMPLETE  COMPARISON:  CT abdomen and pelvis from 08/03/2013, and renal ultrasound performed 02/13/2012  FINDINGS: Gallbladder:  Contracted and not well assessed. A 2.0 cm stone is noted within the gallbladder. This appearance is grossly unchanged from the prior CT. No ultrasonographic Murphy's sign is elicited.  Common bile duct:  Diameter: 0.4 cm, within normal limits in caliber.  Liver:  No focal lesion identified. Diffuse soft tissue thickening about the porta hepatis is thought to reflect periportal edema, also noted on prior CT. Within normal limits in parenchymal echogenicity. Trace ascites is noted about the liver.  IVC:  Not fully characterized, but grossly unremarkable in appearance.  Pancreas:  The visualized portions of the pancreas are grossly unremarkable, though the pancreas is difficult to fully assess.  Spleen:  Size and appearance within normal limits.  Right Kidney:   Length: 13.1 cm. Echogenicity within normal limits. Trace perinephric fluid is noted. No hydronephrosis visualized. A 2.3 cm cyst is noted at the lower pole of the right kidney.  Left Kidney:  Length: 12.7 cm. Echogenicity within normal limits. Trace perinephric fluid is noted. No hydronephrosis visualized. A 0.9 cm cyst is noted at the upper pole of the left kidney.  Abdominal aorta:  No aneurysm visualized. Not fully characterized due to overlying bowel gas.  Other findings:  None.  IMPRESSION: 1. No acute abnormality seen within the abdomen. Evaluation is mildly suboptimal due to bowel gas. 2. Gallbladder contracted and not well assessed, with a 2.0 cm stone in the gallbladder. This appearance is stable from the prior CT. No ultrasonographic Murphy's sign elicited. No evidence for acute cholecystitis or obstruction. 3. Periportal edema again noted. 4. Trace perinephric fluid noted bilaterally, mildly more prominent than on the prior CT and of uncertain significance. Small bilateral renal cysts seen. 5. Trace ascites noted about the liver.   Electronically Signed   By: Garald Balding M.D.   On: 11/07/2013 21:23   Dg Chest Portable 1 View  11/07/2013   CLINICAL DATA:  Generalized weakness  EXAM: PORTABLE CHEST - 1 VIEW  COMPARISON:  09/12/2013  FINDINGS: Stable changes from previous cardiac surgery. Cardiac silhouette is enlarged. No mediastinal or hilar masses.  Mild stable interstitial thickening. Lungs are otherwise clear. No pleural effusion or pneumothorax.  Bony thorax is demineralized but grossly intact.  IMPRESSION: No acute cardiopulmonary disease.   Electronically Signed   By: Lajean Manes M.D.   On: 11/07/2013 09:29    Microbiology: Recent Results (from the past 240 hour(s))  URINE CULTURE     Status: None   Collection Time    11/07/13  7:56 AM      Result Value Ref Range Status   Specimen Description URINE, CATHETERIZED   Final   Special Requests NONE   Final   Culture  Setup Time      Final   Value: 11/07/2013 18:34     Performed at [place]     Final   Value: NO GROWTH     Performed at [facility]   Culture     Final   Value: NO GROWTH     Performed at [facility]   Report Status 11/08/2013 FINAL   Final  CULTURE, BLOOD (ROUTINE X 2)     Status: None   Collection  Time    11/07/13  8:20 AM      Result Value Ref Range Status   Specimen Description BLOOD LEFT ARM   Final   Special Requests BOTTLES DRAWN AEROBIC ONLY 4CC   Final   Culture  Setup Time     Final   Value: 11/07/2013 18:00     Performed at [facility]   Culture     Final   Value: NO GROWTH 5 DAYS     Performed at [facility]   Report Status 11/13/2013 FINAL   Final  CULTURE, BLOOD (ROUTINE X 2)     Status: None   Collection Time    11/07/13  8:27 AM      Result Value Ref Range Status   Specimen Description BLOOD LEFT HAND   Final   Special Requests BOTTLES DRAWN AEROBIC ONLY 3CC   Final   Culture  Setup Time     Final   Value: 11/07/2013 18:00     Performed at [facility]   Culture     Final   Value: NO GROWTH 5 DAYS     Performed at [facility]   Report Status 11/13/2013 FINAL   Final  MRSA PCR SCREENING     Status: None   Collection Time    11/07/13  2:09 PM      Result Value Ref Range Status   MRSA by PCR NEGATIVE  NEGATIVE Final   Comment:            The GeneXpert MRSA Assay (FDA     approved for NASAL specimens     only), is one component of a     comprehensive MRSA colonization     surveillance program. It is not     intended to diagnose MRSA     infection nor to guide or     monitor treatment for     MRSA infections.     Labs: Basic Metabolic Panel:  Recent Labs Lab 11/10/13 0234 11/11/13 0304 11/12/13 0500 11/14/13 0923 11/14/13 1301  NA 139 138 139 139 141  K 3.9 3.9 4.5 5.7* 4.8  CL 109 107 109 106 108  CO2 15* 17* 16* 15* 15*  GLUCOSE 170* 145* 130* 76 74  BUN 26* 29* 34* 40* 42*   CREATININE 1.18* 1.09 1.03 1.08 1.15*  CALCIUM 8.8 9.3 9.4 9.3 9.1   Liver Function Tests:  Recent Labs Lab 11/09/13 0947 11/10/13 0234 11/11/13 0304 11/12/13 0500 11/14/13 0923  AST 562* 301* 142* 83* 54*  ALT 1006* 812* 603* 473* 308*  ALKPHOS 119* 133* 146* 150* 144*  BILITOT 2.1* 1.4* 1.2 1.1 2.3*  PROT 5.8* 5.8* 6.1 6.2 7.1  ALBUMIN 2.5* 2.4* 2.6* 2.7* 3.3*   No results found for this basename: LIPASE, AMYLASE,  in the last 168 hours No results found for this basename: AMMONIA,  in the last 168 hours CBC:  Recent Labs Lab 11/09/13 0947 11/10/13 0234 11/11/13 0304 11/12/13 0500 11/14/13 0923  WBC 6.6 4.2 5.7 6.7 14.9*  NEUTROABS  --   --   --   --  11.7*  HGB 10.7* 9.6* 10.6* 11.2* 12.5  HCT 32.4* 28.8* 31.2* 33.5* 39.3  MCV 95.6 95.0 95.7 96.0 97.0  PLT 60* 49* 62* 81* 145*   Cardiac Enzymes:  Recent Labs Lab 11/14/13 0923  TROPONINI <0.30   BNP: BNP (last 3 results)  Recent Labs  09/12/13 1122 11/07/13 1000 11/14/13 0923  PROBNP  16828.0* 24587.0* 13807.0*   CBG: No results found for this basename: GLUCAP,  in the last 168 hours     Signed:  Adalberto Metzgar  [facility] 11/13/2013, 3:18 PM

## 2013-11-14 NOTE — ED Notes (Signed)
160-180 afib RVR,, mental status changes, fall, head injury on warfarin. Normally awake and alert, ambulatory, patient now lethargic but responsive to voice and following commands Fall at 0830 this am, patient was put back in bed, fall again at 0730, was found laying prone next to bed.  On warfarin, possibly hit head on dresser.  Wears oxygen  at home.  Yesterday patient was sitting up having conversation, today patient is lethargic and altered, patient has generalized edema and swollen.: BP: 190/98. Drugs given en route cardiazem 10 mg, another 10 mg 10 minutes later, after en route.  Patient is tachypnic.  Was just released from hospital.  Also has rash near IV site but only meds given were diltiazem and patient has received IV diltiazem before.

## 2013-11-14 NOTE — ED Provider Notes (Signed)
CSN: [id number]     Arrival date & time 11/14/13  0902 History   First MD Initiated Contact with Patient 11/14/13 [phone]     Chief Complaint  Patient presents with  . Fall    on warfarin      HPI Normally awake and alert, ambulatory, patient now lethargic but responsive to voice and following commands  Fall at 0830 this am, patient was put back in bed, fall again at 0730, was found laying prone next to bed. On warfarin, possibly hit head on dresser. Wears oxygen [place] at home. Yesterday patient was sitting up having conversation, today patient is lethargic and altered, patient has generalized edema and swollen.: BP: 190/98. Drugs given en route cardiazem 10 mg, another 10 mg 10 minutes later, after en route. Patient is tachypnic. Was just released from hospital. Also has rash near IV site but only meds given were diltiazem and patient has received IV diltiazem before.  Past Medical History  Diagnosis Date  . Coronary artery disease   . Atrial fibrillation     Now NSR  . GERD (gastroesophageal reflux disease)   . Hyperlipidemia   . 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 Anemia   . Angina   . Hypertension   . Blood transfusion     "related to bad case of bronchitis once; 1 yr ago given due to GIB" (12/17/2012)  . H/O hiatal hernia   . Lupus     "that's compromised her lungs somewhat" (12/17/2012)  . Insomnia   . Endocarditis   . Prosthetic valve endocarditis   . Enterococcal bacteremia   . Heart murmur   . CHF (congestive heart failure)   . Pneumonia     "once or twice" (12/17/2012)  . Chronic bronchitis     "used to get it once/yr; hasn't had it since ~ 1986" (12/17/2012)  . Shortness of breath     "all the times sometimes" (12/17/2012)  . On home oxygen therapy     "2L prn; always at night" (12/17/2012)  . Lower GI bleed ~ 01/2012  . Arthritis     "hands" (12/17/2012)  . 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 Right bundle branch  block 11/07/2013   Past Surgical History  Procedure Laterality Date  . Givens capsule study  04/09/2011    Procedure: GIVENS CAPSULE STUDY;  Surgeon: Beryle Beams, MD;  Location: [place];  Service: Endoscopy;  Laterality: N/A;  . Tee without cardioversion  02/06/2012    Procedure: TRANSESOPHAGEAL ECHOCARDIOGRAM (TEE);  Surgeon: Birdie Riddle, MD;  Location: [facility] ENDOSCOPY;  Service: Cardiovascular;  Laterality: N/A;  . Esophagogastroduodenoscopy  02/12/2012    Procedure: ESOPHAGOGASTRODUODENOSCOPY (EGD);  Surgeon: Beryle Beams, MD;  Location: [facility] ENDOSCOPY;  Service: Endoscopy;  Laterality: N/A;  . Tee without cardioversion N/A 07/15/2012    Procedure: TRANSESOPHAGEAL ECHOCARDIOGRAM (TEE);  Surgeon: Birdie Riddle, MD;  Location: [facility] ENDOSCOPY;  Service: Cardiovascular;  Laterality: N/A;  . Breast lumpectomy Left 1977    "benign" (12/17/2012)  . Tonsillectomy  1940's  . Coronary artery bypass graft  2006  . 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   . Cardiac catheterization      "probably 2-3" (12/17/2012)  . Coronary angioplasty with stent placement      "several put in over the years" (12/17/2012)  . Cataract extraction w/ intraocular lens  implant, bilateral Bilateral 11/2000  .  Peripherally inserted central catheter insertion Right 12/15/2012    "upper arm" (12/17/2012   Family History  Problem Relation Age of Onset  . Heart disease Mother     CHF  . Mental illness Father     suicide  . Cancer Sister     pancreatic cancer  . COPD Brother    History  Substance Use Topics  . Smoking status: Never Smoker   . Smokeless tobacco: Never Used  . Alcohol Use: No   OB History   Grav Para Term Preterm Abortions TAB SAB Ect Mult Living                 Review of Systems  Level V caveat  Allergies  Ceftriaxone and Codeine  Home Medications   Prior to Admission medications   Medication Sig Start Date End Date Taking? Authorizing Provider   amiodarone (PACERONE) 200 MG tablet Take 1 tablet (200 mg total) by mouth daily. 11/13/13  Yes Hosie Poisson, MD  amoxicillin (AMOXIL) 500 MG capsule Take 1 capsule (500 mg total) by mouth every 12 (twelve) hours. 09/15/13  Yes Barton Dubois, MD  buprenorphine (BUTRANS) 5 MCG/HR PTWK patch Place 1 patch (5 mcg total) onto the skin once a week. 07/03/13  Yes Tiffany L Reed, DO  famotidine (PEPCID) 20 MG tablet Take 1 tablet (20 mg total) by mouth 2 (two) times daily. 10/27/13  Yes Estill Dooms, MD  furosemide (LASIX) 20 MG tablet Take 1 tablet (20 mg total) by mouth daily. 11/13/13  Yes Hosie Poisson, MD  gabapentin (NEURONTIN) 300 MG capsule Take 1 capsule (300 mg total) by mouth at bedtime. 10/27/13  Yes Estill Dooms, MD  hydroxychloroquine (PLAQUENIL) 200 MG tablet Take 200 mg by mouth 2 (two) times daily.    Yes Historical Provider, MD  IRON PO Take 1 tablet by mouth 2 (two) times daily.   Yes Historical Provider, MD  metoprolol tartrate (LOPRESSOR) 25 MG tablet Take 25 mg by mouth daily.   Yes Historical Provider, MD  mirtazapine (REMERON) 7.5 MG tablet Take 1 tablet (7.5 mg total) by mouth at bedtime. For appetite 08/31/13  Yes Tiffany L Reed, DO  potassium chloride SA (K-DUR,KLOR-CON) 20 MEQ tablet Take 40-60 mEq by mouth 2 (two) times daily. Take 40 MEQ by mouth in the morning and take 60 MEQ by mouth in the evening.   Yes Historical Provider, MD  rosuvastatin (CRESTOR) 10 MG tablet Take 10 mg by mouth every morning.    Yes Historical Provider, MD  saccharomyces boulardii (FLORASTOR) 250 MG capsule Take 1 capsule (250 mg total) by mouth 2 (two) times daily. 08/31/13  Yes Tiffany L Reed, DO  warfarin (COUMADIN) 4 MG tablet Take 4 mg by mouth daily.   Yes Historical Provider, MD   BP 129/74  Pulse 85  Temp(Src) 97.5 F (36.4 C) (Oral)  Resp 18  Ht 5\' 6"  (1.676 m)  Wt 172 lb 14.4 oz (78.427 kg)  BMI 27.92 kg/m2  SpO2 93% Physical Exam Physical Exam  Nursing note and vitals  reviewed. Constitutional: She is oriented to person, place, and time. She appears well-developed and well-nourished. No distress.  HENT:  Head: Normocephalic and atraumatic she appears to have periorbital and facial edema..  Eyes: Pupils are equal, round, and reactive to light.  Neck: Normal range of motion.  Cardiovascular: Normal rate and intact distal pulses.   Pulmonary/Chest: Tachypnea noted on nasal oxygen.  Breath sounds with scattered rhonchi and rales..  Abdominal: Normal  appearance. She exhibits no distension.  Musculoskeletal: Normal range of motion.  edema noted in both lower extremities Neurological: She is alert and oriented to person, place, and time. No cranial nerve deficit. Generalized weakness Skin: Skin is warm and dry. No rash noted.  Psychiatric: She has a normal mood and affect. Her behavior is normal.   ED Course  Procedures (including critical care time) Labs Review Labs Reviewed  CBC WITH DIFFERENTIAL - Abnormal; Notable for the following:    WBC 14.9 (*)    RDW 18.3 (*)    Platelets 145 (*)    Neutrophils Relative % 79 (*)    Neutro Abs 11.7 (*)    Lymphocytes Relative 11 (*)    Monocytes Absolute 1.4 (*)    All other components within normal limits  COMPREHENSIVE METABOLIC PANEL - Abnormal; Notable for the following:    Potassium 5.7 (*)    CO2 15 (*)    BUN 40 (*)    Albumin 3.3 (*)    AST 54 (*)    ALT 308 (*)    Alkaline Phosphatase 144 (*)    Total Bilirubin 2.3 (*)    GFR calc non Af Amer 49 (*)    GFR calc Af Amer 57 (*)    Anion gap 18 (*)    All other components within normal limits  URINALYSIS, ROUTINE W REFLEX MICROSCOPIC - Abnormal; Notable for the following:    Color, Urine AMBER (*)    APPearance CLOUDY (*)    Protein, ur 30 (*)    All other components within normal limits  PROTIME-INR - Abnormal; Notable for the following:    Prothrombin Time 28.5 (*)    INR 2.68 (*)    All other components within normal limits  PRO B  NATRIURETIC PEPTIDE - Abnormal; Notable for the following:    Pro B Natriuretic peptide (BNP) 13807.0 (*)    All other components within normal limits  BASIC METABOLIC PANEL - Abnormal; Notable for the following:    CO2 15 (*)    BUN 42 (*)    Creatinine, Ser 1.15 (*)    GFR calc non Af Amer 45 (*)    GFR calc Af Amer 53 (*)    Anion gap 18 (*)    All other components within normal limits  PROTIME-INR - Abnormal; Notable for the following:    Prothrombin Time 31.1 (*)    INR 3.00 (*)    All other components within normal limits  CBC - Abnormal; Notable for the following:    RBC 3.12 (*)    Hemoglobin 10.3 (*)    HCT 30.4 (*)    RDW 18.6 (*)    Platelets 102 (*)    All other components within normal limits  BASIC METABOLIC PANEL - Abnormal; Notable for the following:    CO2 18 (*)    BUN 42 (*)    Creatinine, Ser 1.19 (*)    GFR calc non Af Amer 44 (*)    GFR calc Af Amer 50 (*)    All other components within normal limits  LIPID PANEL - Abnormal; Notable for the following:    HDL 38 (*)    All other components within normal limits  I-STAT ARTERIAL BLOOD GAS, ED - Abnormal; Notable for the following:    pCO2 arterial 24.7 (*)    pO2, Arterial 72.0 (*)    Bicarbonate 15.8 (*)    Acid-base deficit 6.0 (*)    All other  components within normal limits  CULTURE, BLOOD (ROUTINE X 2)  CULTURE, BLOOD (ROUTINE X 2)  CLOSTRIDIUM DIFFICILE BY PCR  LACTIC ACID, PLASMA  TROPONIN I  URINE MICROSCOPIC-ADD ON  HEMOGLOBIN A1C    Imaging Review Ct Head Wo Contrast  11/14/2013   CLINICAL DATA:  73 year old female code stroke. Unresponsive with mental status change. Initial encounter.  EXAM: CT HEAD WITHOUT CONTRAST  TECHNIQUE: Contiguous axial images were obtained from the base of the skull through the vertex without intravenous contrast.  COMPARISON:  Head and cervical spine CT 1039 hr the same day and earlier.  FINDINGS: Stable paranasal sinuses and mastoids. No acute osseous  abnormality identified. Stable orbit and scalp soft tissues.  Calcified atherosclerosis at the skull base. 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 Left thalamic and right caudate lacunar infarcts also are stable. Patchy and confluent cerebral white matter hypodensity also has not significantly changed. Scattered small lacunar infarcts in both cerebellar hemispheres also appear stable. No midline shift, mass effect, or evidence of intracranial mass lesion. No acute intracranial hemorrhage identified. No evidence of cortically based acute infarction identified. No suspicious intracranial vascular hyperdensity.  IMPRESSION: 1. Stable non contrast CT appearance of the brain. No new intracranial abnormality identified. 2. Chronic ischemic disease detailed above. Study discussed by telephone with Dr. Wallie Char on 11/14/2013 at 1718 hrs.   Electronically Signed   By: Lars Pinks M.D.   On: 11/14/2013 17:22   Ct Head Wo Contrast  11/14/2013   CLINICAL DATA:  Fall earlier today. Now altered mental status and lethargy. Patient on Coumadin.  EXAM: CT HEAD WITHOUT CONTRAST  CT CERVICAL SPINE WITHOUT CONTRAST  TECHNIQUE: Multidetector CT imaging of the head and cervical spine was performed following the standard protocol without intravenous contrast. Multiplanar CT image reconstructions of the cervical spine were also generated.  COMPARISON:  Prior CT scan of the head and cervical spine 06/27/2013  FINDINGS: CT HEAD FINDINGS  Negative for acute intracranial hemorrhage, acute infarction, mass, mass effect, hydrocephalus or midline shift. Gray-white differentiation is preserved throughout. A stable appearance of atrophy, prior right parietal infarct, prior right occipital infarct and extensive sequelae of longstanding chronic microvascular ischemic white matter disease. Stable ventricular configuration. No acute scalp hematoma or calvarial fracture. Normal aeration of the  mastoid air cells. Mild mucoperiosteal thickening in the inferior aspect of the maxillary sinuses. Atherosclerosis in the bilateral cavernous carotid arteries.  CT CERVICAL SPINE FINDINGS  No acute fracture, malalignment or prevertebral soft tissue swelling. Multilevel degenerative disc disease. Disc space narrowing most prominent at C5-C6. Multilevel facet arthropathy on the right at C3-C4 and C4-C5. The is Unremarkable CT appearance of the thyroid gland. No acute soft tissue abnormality. The lung apices are unremarkable.  IMPRESSION: CT HEAD  1. No acute intracranial abnormality. 2. Stable atrophy, remote infarcts involving the right parietal and right occipital regions and advanced chronic microvascular ischemic white matter disease. CT CSPINE  1. No acute fracture or malalignment. 2. Multilevel cervical spondylosis and right-sided facet arthropathy.   Electronically Signed   By: Jacqulynn Cadet M.D.   On: 11/14/2013 11:34   Ct Cervical Spine Wo Contrast  11/14/2013   CLINICAL DATA:  Fall earlier today. Now altered mental status and lethargy. Patient on Coumadin.  EXAM: CT HEAD WITHOUT CONTRAST  CT CERVICAL SPINE WITHOUT CONTRAST  TECHNIQUE: Multidetector CT imaging of the head and cervical spine was performed following the standard protocol without intravenous contrast. Multiplanar CT image  reconstructions of the cervical spine were also generated.  COMPARISON:  Prior CT scan of the head and cervical spine 06/27/2013  FINDINGS: CT HEAD FINDINGS  Negative for acute intracranial hemorrhage, acute infarction, mass, mass effect, hydrocephalus or midline shift. Gray-white differentiation is preserved throughout. A stable appearance of atrophy, prior right parietal infarct, prior right occipital infarct and extensive sequelae of longstanding chronic microvascular ischemic white matter disease. Stable ventricular configuration. No acute scalp hematoma or calvarial fracture. Normal aeration of the mastoid air  cells. Mild mucoperiosteal thickening in the inferior aspect of the maxillary sinuses. Atherosclerosis in the bilateral cavernous carotid arteries.  CT CERVICAL SPINE FINDINGS  No acute fracture, malalignment or prevertebral soft tissue swelling. Multilevel degenerative disc disease. Disc space narrowing most prominent at C5-C6. Multilevel facet arthropathy on the right at C3-C4 and C4-C5. The is Unremarkable CT appearance of the thyroid gland. No acute soft tissue abnormality. The lung apices are unremarkable.  IMPRESSION: CT HEAD  1. No acute intracranial abnormality. 2. Stable atrophy, remote infarcts involving the right parietal and right occipital regions and advanced chronic microvascular ischemic white matter disease. CT CSPINE  1. No acute fracture or malalignment. 2. Multilevel cervical spondylosis and right-sided facet arthropathy.   Electronically Signed   By: Jacqulynn Cadet M.D.   On: 11/14/2013 11:34         Dg Chest Portable 1 View  11/14/2013   CLINICAL DATA:  Altered mental status  EXAM: PORTABLE CHEST - 1 VIEW  COMPARISON:  Prior chest x-ray 11/07/2013  FINDINGS: Stable cardiomegaly. Patient is status post median sternotomy with evidence of prior CABG. Patient is status post median sternotomy with evidence of prior CABG. Atherosclerotic calcifications present within the transverse aorta. Is slightly lower inspiratory volumes.a increased pulmonary vascular congestion now with mild interstitial edema. Kerley B-lines are noted in the periphery of the right lung. No focal airspace consolidation, pleural effusion or pneumothorax. No acute osseous abnormality.  IMPRESSION: 1. Interval development of mild interstitial edema concerning for mild CHF.   Electronically Signed   By: Jacqulynn Cadet M.D.   On: 11/14/2013 10:20     EKG Interpretation  Date: 11/14/2013  Rate: 145  Rhythm: Atrial fibrillation with RVR  QRS Axis: normal  Intervals: normal  ST/T Wave abnormalities: Unspecific  ST-T changes  Conduction Disutrbances: Right bundle branch block  Narrative Interpretation: Abnormal EKG          MDM   Final diagnoses:  Fever, unspecified fever cause  Leukocytosis        Dot Lanes, MD 11/15/13 1155

## 2013-11-15 ENCOUNTER — Inpatient Hospital Stay (HOSPITAL_COMMUNITY): Payer: Medicare Other

## 2013-11-15 DIAGNOSIS — R197 Diarrhea, unspecified: Secondary | ICD-10-CM | POA: Diagnosis present

## 2013-11-15 DIAGNOSIS — M329 Systemic lupus erythematosus, unspecified: Secondary | ICD-10-CM

## 2013-11-15 DIAGNOSIS — I5022 Chronic systolic (congestive) heart failure: Secondary | ICD-10-CM

## 2013-11-15 DIAGNOSIS — I2510 Atherosclerotic heart disease of native coronary artery without angina pectoris: Secondary | ICD-10-CM

## 2013-11-15 LAB — HEMOGLOBIN A1C
Hgb A1c MFr Bld: 5.8 % — ABNORMAL HIGH (ref <5.7)
Mean Plasma Glucose: 120 mg/dL — ABNORMAL HIGH (ref <117)

## 2013-11-15 LAB — BASIC METABOLIC PANEL
Anion gap: 14 (ref 5–15)
BUN: 42 mg/dL — ABNORMAL HIGH (ref 6–23)
CALCIUM: 8.4 mg/dL (ref 8.4–10.5)
CO2: 18 mEq/L — ABNORMAL LOW (ref 19–32)
Chloride: 108 mEq/L (ref 96–112)
Creatinine, Ser: 1.19 mg/dL — ABNORMAL HIGH (ref 0.50–1.10)
GFR calc Af Amer: 50 mL/min — ABNORMAL LOW (ref >90)
GFR, EST NON AFRICAN AMERICAN: 44 mL/min — AB (ref >90)
GLUCOSE: 89 mg/dL (ref 70–99)
Potassium: 3.8 mEq/L (ref 3.7–5.3)
SODIUM: 140 meq/L (ref 137–147)

## 2013-11-15 LAB — CBC
HCT: 30.4 % — ABNORMAL LOW (ref 36.0–46.0)
Hemoglobin: 10.3 g/dL — ABNORMAL LOW (ref 12.0–15.0)
MCH: 33 pg (ref 26.0–34.0)
MCHC: 33.9 g/dL (ref 30.0–36.0)
MCV: 97.4 fL (ref 78.0–100.0)
Platelets: 102 10*3/uL — ABNORMAL LOW (ref 150–400)
RBC: 3.12 MIL/uL — ABNORMAL LOW (ref 3.87–5.11)
RDW: 18.6 % — ABNORMAL HIGH (ref 11.5–15.5)
WBC: 6.8 10*3/uL (ref 4.0–10.5)

## 2013-11-15 LAB — PROTIME-INR
INR: 3 — ABNORMAL HIGH (ref 0.00–1.49)
PROTHROMBIN TIME: 31.1 s — AB (ref 11.6–15.2)

## 2013-11-15 LAB — LIPID PANEL
Cholesterol: 98 mg/dL (ref 0–200)
HDL: 38 mg/dL — ABNORMAL LOW (ref >39)
LDL Cholesterol: 42 mg/dL (ref 0–99)
Total CHOL/HDL Ratio: 2.6 RATIO
Triglycerides: 88 mg/dL (ref <150)
VLDL: 18 mg/dL (ref 0–40)

## 2013-11-15 LAB — CLOSTRIDIUM DIFFICILE BY PCR: Toxigenic C. Difficile by PCR: POSITIVE — AB

## 2013-11-15 MED ORDER — FUROSEMIDE 10 MG/ML IJ SOLN
20.0000 mg | Freq: Two times a day (BID) | INTRAMUSCULAR | Status: DC
  Filled 2013-11-15: qty 2

## 2013-11-15 MED ORDER — WARFARIN SODIUM 1 MG PO TABS
1.0000 mg | ORAL_TABLET | Freq: Once | ORAL | Status: AC
  Administered 2013-11-15: 1 mg via ORAL
  Filled 2013-11-15: qty 1

## 2013-11-15 MED ORDER — LEVOFLOXACIN 500 MG PO TABS
500.0000 mg | ORAL_TABLET | Freq: Once | ORAL | Status: AC
  Administered 2013-11-15: 500 mg via ORAL
  Filled 2013-11-15 (×2): qty 1

## 2013-11-15 MED ORDER — FUROSEMIDE 20 MG PO TABS
20.0000 mg | ORAL_TABLET | Freq: Two times a day (BID) | ORAL | Status: DC
  Administered 2013-11-16 (×3): 20 mg via ORAL
  Filled 2013-11-15 (×6): qty 1

## 2013-11-15 MED ORDER — VANCOMYCIN 50 MG/ML ORAL SOLUTION
250.0000 mg | Freq: Four times a day (QID) | ORAL | Status: DC
  Administered 2013-11-15 – 2013-11-19 (×16): 250 mg via ORAL
  Filled 2013-11-15 (×20): qty 5

## 2013-11-15 NOTE — Progress Notes (Signed)
Patient ID: [name], female   DOB: 1940-04-15, 73 y.o.   MRN: [id number]         [facility]    Date of Admission:  11/14/2013   On chronic amoxicillin               Day 2 daptomycin        Day 2 piperacillin tazobactam  Active Problems:   Fever   CVA (cerebral vascular accident)   Diarrhea   Atrial fibrillation   Prosthetic valve endocarditis   CORONARY ARTERY DISEASE   CORONARY ARTERY BYPASS GRAFT, HX OF   Protein-calorie malnutrition, severe   Lupus   CKD (chronic kidney disease) stage 3, GFR 30-59 ml/min   Aphasia   . amiodarone  200 mg Oral Daily  . atorvastatin  20 mg Oral q1800  . famotidine  20 mg Oral BID  . furosemide  20 mg Intravenous Q12H  . gabapentin  300 mg Oral QHS  . hydroxychloroquine  200 mg Oral BID  . metoprolol tartrate  25 mg Oral Daily  . mirtazapine  7.5 mg Oral QHS  . piperacillin-tazobactam (ZOSYN)  IV  3.375 g Intravenous Q8H  . saccharomyces boulardii  250 mg Oral BID  . vancomycin  750 mg Intravenous Q12H  . warfarin  1 mg Oral ONCE-1800  . Warfarin - Pharmacist Dosing Inpatient   Does not apply q1800    Subjective: She is currently out of her room for a chest x-ray. I reviewed recent events with her daughter. Result was just recently discharged from the hospital on August 21. Blood cultures done during that admission were negative. She was continued on her chronic suppressive amoxicillin for her history of recurrent enterococcal prosthetic valve endocarditis. Yesterday she was found down on her bedroom floor. She was found to be febrile with difficulty speaking when she arrived here. Brain MRI shows a small acute left frontal CVA. She developed diarrhea yesterday as well. Review of Systems: Review of systems not obtained due to patient factors.  Past Medical History  Diagnosis Date  . Coronary artery disease   . Atrial fibrillation     Now NSR  . GERD (gastroesophageal reflux disease)   .  Hyperlipidemia   . 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 Anemia   . Angina   . Hypertension   . Blood transfusion     "related to bad case of bronchitis once; 1 yr ago given due to GIB" (12/17/2012)  . H/O hiatal hernia   . Lupus     "that's compromised her lungs somewhat" (12/17/2012)  . Insomnia   . Endocarditis   . Prosthetic valve endocarditis   . Enterococcal bacteremia   . Heart murmur   . CHF (congestive heart failure)   . Pneumonia     "once or twice" (12/17/2012)  . Chronic bronchitis     "used to get it once/yr; hasn't had it since ~ 1986" (12/17/2012)  . Shortness of breath     "all the times sometimes" (12/17/2012)  . On home oxygen therapy     "2L prn; always at night" (12/17/2012)  . Lower GI bleed ~ 01/2012  . Arthritis     "hands" (12/17/2012)  . 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 Right bundle branch block 11/07/2013    History  Substance Use Topics  . Smoking status: Never Smoker   . Smokeless  tobacco: Never Used  . Alcohol Use: No    Family History  Problem Relation Age of Onset  . Heart disease Mother     CHF  . Mental illness Father     suicide  . Cancer Sister     pancreatic cancer  . COPD Brother     Allergies  Allergen Reactions  . Ceftriaxone     RASH BUT TOLERATED AMPICILLIN AND IMIPENEM WITHOUT PROBLEMS  . Codeine Nausea Only    Objective: Temp:  [97.5 F (36.4 C)-97.6 F (36.4 C)] 97.5 F (36.4 C) (08/23 1000) Pulse Rate:  [83-98] 85 (08/23 1000) Resp:  [18-21] 18 (08/23 1000) BP: (106-146)/(51-77) 129/74 mmHg (08/23 1000) SpO2:  [93 %-100 %] 93 % (08/23 1000) Weight:  [172 lb 14.4 oz (78.427 kg)] 172 lb 14.4 oz (78.427 kg) (08/22 2214)  General: She is out of room and not available for examination  Lab Results Lab Results  Component Value Date   WBC 6.8 11/15/2013   HGB 10.3* 11/15/2013   HCT 30.4* 11/15/2013   MCV 97.4 11/15/2013   PLT 102* 11/15/2013    Lab Results    Component Value Date   CREATININE 1.19* 11/15/2013   BUN 42* 11/15/2013   NA 140 11/15/2013   K 3.8 11/15/2013   CL 108 11/15/2013   CO2 18* 11/15/2013    Lab Results  Component Value Date   ALT 308* 11/14/2013   AST 54* 11/14/2013   ALKPHOS 144* 11/14/2013   BILITOT 2.3* 11/14/2013      Microbiology: Recent Results (from the past 240 hour(s))  URINE CULTURE     Status: None   Collection Time    11/07/13  7:56 AM      Result Value Ref Range Status   Specimen Description URINE, CATHETERIZED   Final   Special Requests NONE   Final   Culture  Setup Time     Final   Value: 11/07/2013 18:34     Performed at [facility]     Final   Value: NO GROWTH     Performed at [facility]   Culture     Final   Value: NO GROWTH     Performed at [facility]   Report Status 11/08/2013 FINAL   Final  CULTURE, BLOOD (ROUTINE X 2)     Status: None   Collection Time    11/07/13  8:20 AM      Result Value Ref Range Status   Specimen Description BLOOD LEFT ARM   Final   Special Requests BOTTLES DRAWN AEROBIC ONLY 4CC   Final   Culture  Setup Time     Final   Value: 11/07/2013 18:00     Performed at [facility]   Culture     Final   Value: NO GROWTH 5 DAYS     Performed at [facility]   Report Status 11/13/2013 FINAL   Final  CULTURE, BLOOD (ROUTINE X 2)     Status: None   Collection Time    11/07/13  8:27 AM      Result Value Ref Range Status   Specimen Description BLOOD LEFT HAND   Final   Special Requests BOTTLES DRAWN AEROBIC ONLY 3CC   Final   Culture  Setup Time     Final   Value: 11/07/2013 18:00     Performed at [facility]     Final  Value: NO GROWTH 5 DAYS     Performed at [facility]   Report Status 11/13/2013 FINAL   Final  MRSA PCR SCREENING     Status: None   Collection Time    11/07/13  2:09 PM      Result Value Ref Range Status   MRSA by PCR NEGATIVE  NEGATIVE Final   Comment:             The GeneXpert MRSA Assay (FDA     approved for NASAL specimens     only), is one component of a     comprehensive MRSA colonization     surveillance program. It is not     intended to diagnose MRSA     infection nor to guide or     monitor treatment for     MRSA infections.  CULTURE, BLOOD (ROUTINE X 2)     Status: None   Collection Time    11/14/13  9:48 AM      Result Value Ref Range Status   Specimen Description BLOOD LEFT HAND   Final   Special Requests BOTTLES DRAWN AEROBIC ONLY 5ML   Final   Culture  Setup Time     Final   Value: 11/14/2013 20:00     Performed at [facility]   Culture     Final   Value:        BLOOD CULTURE RECEIVED NO GROWTH TO DATE CULTURE WILL BE HELD FOR 5 DAYS BEFORE ISSUING A FINAL NEGATIVE REPORT     Performed at [facility]   Report Status PENDING   Incomplete    Studies/Results: Dg Chest 2 View  11/15/2013   CLINICAL DATA:  Suspect pneumonia; history of CHF  EXAM: CHEST  2 VIEW  COMPARISON:  .  Portable chest x-ray of November 14, 2013  FINDINGS: The right lung is less well inflated today. The interstitial markings are increased bilaterally. The cardiopericardial silhouette is enlarged. The pulmonary vascularity is indistinct. There is dense calcification in the mitral valvular annulus. A trace of pleural fluid blunts the costophrenic angles bilaterally. The patient has undergone previous median sternotomy. The bony thorax is unremarkable on the frontal film. On the lateral film the thoracic spine cannot be adequately assessed.  IMPRESSION: Mildly increased interstitial markings bilaterally are slightly more conspicuous today. This may reflect worsening of CHF. No discrete alveolar pneumonia is demonstrated.   Electronically Signed   By: David  Martinique   On: 11/15/2013 13:54   Ct Head Wo Contrast  11/14/2013   CLINICAL DATA:  73 year old female code stroke. Unresponsive with mental status change. Initial encounter.  EXAM: CT HEAD  WITHOUT CONTRAST  TECHNIQUE: Contiguous axial images were obtained from the base of the skull through the vertex without intravenous contrast.  COMPARISON:  Head and cervical spine CT 1039 hr the same day and earlier.  FINDINGS: Stable paranasal sinuses and mastoids. No acute osseous abnormality identified. Stable orbit and scalp soft tissues.  Calcified atherosclerosis at the skull base. 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 Left thalamic and right caudate lacunar infarcts also are stable. Patchy and confluent cerebral white matter hypodensity also has not significantly changed. Scattered small lacunar infarcts in both cerebellar hemispheres also appear stable. No midline shift, mass effect, or evidence of intracranial mass lesion. No acute intracranial hemorrhage identified. No evidence of cortically based acute infarction identified. No suspicious intracranial vascular hyperdensity.  IMPRESSION: 1. Stable  non contrast CT appearance of the brain. No new intracranial abnormality identified. 2. Chronic ischemic disease detailed above. Study discussed by telephone with Dr. Wallie Char on 11/14/2013 at 1718 hrs.   Electronically Signed   By: Lars Pinks M.D.   On: 11/14/2013 17:22   Ct Head Wo Contrast  11/14/2013   CLINICAL DATA:  Fall earlier today. Now altered mental status and lethargy. Patient on Coumadin.  EXAM: CT HEAD WITHOUT CONTRAST  CT CERVICAL SPINE WITHOUT CONTRAST  TECHNIQUE: Multidetector CT imaging of the head and cervical spine was performed following the standard protocol without intravenous contrast. Multiplanar CT image reconstructions of the cervical spine were also generated.  COMPARISON:  Prior CT scan of the head and cervical spine 06/27/2013  FINDINGS: CT HEAD FINDINGS  Negative for acute intracranial hemorrhage, acute infarction, mass, mass effect, hydrocephalus or midline shift. Gray-white differentiation is preserved throughout. A  stable appearance of atrophy, prior right parietal infarct, prior right occipital infarct and extensive sequelae of longstanding chronic microvascular ischemic white matter disease. Stable ventricular configuration. No acute scalp hematoma or calvarial fracture. Normal aeration of the mastoid air cells. Mild mucoperiosteal thickening in the inferior aspect of the maxillary sinuses. Atherosclerosis in the bilateral cavernous carotid arteries.  CT CERVICAL SPINE FINDINGS  No acute fracture, malalignment or prevertebral soft tissue swelling. Multilevel degenerative disc disease. Disc space narrowing most prominent at C5-C6. Multilevel facet arthropathy on the right at C3-C4 and C4-C5. The is Unremarkable CT appearance of the thyroid gland. No acute soft tissue abnormality. The lung apices are unremarkable.  IMPRESSION: CT HEAD  1. No acute intracranial abnormality. 2. Stable atrophy, remote infarcts involving the right parietal and right occipital regions and advanced chronic microvascular ischemic white matter disease. CT CSPINE  1. No acute fracture or malalignment. 2. Multilevel cervical spondylosis and right-sided facet arthropathy.   Electronically Signed   By: Jacqulynn Cadet M.D.   On: 11/14/2013 11:34   Ct Cervical Spine Wo Contrast  11/14/2013   CLINICAL DATA:  Fall earlier today. Now altered mental status and lethargy. Patient on Coumadin.  EXAM: CT HEAD WITHOUT CONTRAST  CT CERVICAL SPINE WITHOUT CONTRAST  TECHNIQUE: Multidetector CT imaging of the head and cervical spine was performed following the standard protocol without intravenous contrast. Multiplanar CT image reconstructions of the cervical spine were also generated.  COMPARISON:  Prior CT scan of the head and cervical spine 06/27/2013  FINDINGS: CT HEAD FINDINGS  Negative for acute intracranial hemorrhage, acute infarction, mass, mass effect, hydrocephalus or midline shift. Gray-white differentiation is preserved throughout. A stable appearance  of atrophy, prior right parietal infarct, prior right occipital infarct and extensive sequelae of longstanding chronic microvascular ischemic white matter disease. Stable ventricular configuration. No acute scalp hematoma or calvarial fracture. Normal aeration of the mastoid air cells. Mild mucoperiosteal thickening in the inferior aspect of the maxillary sinuses. Atherosclerosis in the bilateral cavernous carotid arteries.  CT CERVICAL SPINE FINDINGS  No acute fracture, malalignment or prevertebral soft tissue swelling. Multilevel degenerative disc disease. Disc space narrowing most prominent at C5-C6. Multilevel facet arthropathy on the right at C3-C4 and C4-C5. The is Unremarkable CT appearance of the thyroid gland. No acute soft tissue abnormality. The lung apices are unremarkable.  IMPRESSION: CT HEAD  1. No acute intracranial abnormality. 2. Stable atrophy, remote infarcts involving the right parietal and right occipital regions and advanced chronic microvascular ischemic white matter disease. CT CSPINE  1. No acute fracture or malalignment. 2. Multilevel cervical spondylosis and right-sided  facet arthropathy.   Electronically Signed   By: Jacqulynn Cadet M.D.   On: 11/14/2013 11:34   Mr Jodene Nam Head Wo Contrast  11/14/2013   CLINICAL DATA:  Stroke  EXAM: MRI HEAD WITHOUT AND WITH CONTRAST  MRA HEAD WITHOUT CONTRAST  MRA NECK WITHOUT AND WITH CONTRAST  TECHNIQUE: Multiplanar, multiecho pulse sequences of the brain and surrounding structures were obtained without and with intravenous contrast. Angiographic images of the Circle of Willis were obtained using MRA technique without intravenous contrast. Angiographic images of the neck were obtained using MRA technique without and with intravenous contrast. Carotid stenosis measurements (when applicable) are obtained utilizing NASCET criteria, using the distal internal carotid diameter as the denominator.  CONTRAST:  13mL MULTIHANCE GADOBENATE DIMEGLUMINE 529 MG/ML  IV SOLN  COMPARISON:  CT head 11/14/2013  FINDINGS: MRI HEAD FINDINGS  Small area of acute infarct in the left frontal cortex above the sylvian fissure. No other acute infarct identified.  Generalized atrophy. Chronic ischemic changes throughout the white matter. 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 Mild chronic ischemia in the cerebellum bilaterally. Chronic infarct in the left thalamus. Scattered areas of chronic micro hemorrhage bilaterally most likely due to chronic hypertension  Postcontrast imaging reveals no enhancing mass lesion.  MRA HEAD FINDINGS  Both vertebral arteries are patent to the basilar. Right AICA is patent with proximal stenosis. Left PICA patent. Basilar patent. Superior cerebellar arteries show mild disease proximally. Posterior cerebral arteries are patent bilaterally with mild atherosclerotic disease. No significant stenosis  Internal carotid artery widely patent bilaterally. Anterior and middle cerebral arteries are patent bilaterally. Moderate disease in right middle cerebral artery branches.  Negative for cerebral aneurysm.  MRA NECK FINDINGS  Suboptimal image quality.  Suboptimal timing of the injection.  The internal carotid artery appears patent without significant stenosis bilaterally. Both vertebral arteries are patent without significant stenosis.  IMPRESSION: Subcentimeter focus of acute infarct in the left frontal cortex.  Mild intracranial atherosclerotic disease most prominent in the right middle cerebral artery.  Suboptimal MRA of the neck without significant carotid or vertebral stenosis in the neck.   Electronically Signed   By: Franchot Gallo M.D.   On: 11/14/2013 20:26   Mr Angiogram Neck W Wo Contrast  11/14/2013   CLINICAL DATA:  Stroke  EXAM: MRI HEAD WITHOUT AND WITH CONTRAST  MRA HEAD WITHOUT CONTRAST  MRA NECK WITHOUT AND WITH CONTRAST  TECHNIQUE: Multiplanar, multiecho pulse sequences of the brain and surrounding structures were obtained without and with  intravenous contrast. Angiographic images of the Circle of Willis were obtained using MRA technique without intravenous contrast. Angiographic images of the neck were obtained using MRA technique without and with intravenous contrast. Carotid stenosis measurements (when applicable) are obtained utilizing NASCET criteria, using the distal internal carotid diameter as the denominator.  CONTRAST:  22mL MULTIHANCE GADOBENATE DIMEGLUMINE 529 MG/ML IV SOLN  COMPARISON:  CT head 11/14/2013  FINDINGS: MRI HEAD FINDINGS  Small area of acute infarct in the left frontal cortex above the sylvian fissure. No other acute infarct identified.  Generalized atrophy. Chronic ischemic changes throughout the white matter. 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 Mild chronic ischemia in the cerebellum bilaterally. Chronic infarct in the left thalamus. Scattered areas of chronic micro hemorrhage bilaterally most likely due to chronic hypertension  Postcontrast imaging reveals no enhancing mass lesion.  MRA HEAD FINDINGS  Both vertebral arteries are patent to the basilar. Right AICA is patent with proximal stenosis. Left PICA patent. Basilar patent.  Superior cerebellar arteries show mild disease proximally. Posterior cerebral arteries are patent bilaterally with mild atherosclerotic disease. No significant stenosis  Internal carotid artery widely patent bilaterally. Anterior and middle cerebral arteries are patent bilaterally. Moderate disease in right middle cerebral artery branches.  Negative for cerebral aneurysm.  MRA NECK FINDINGS  Suboptimal image quality.  Suboptimal timing of the injection.  The internal carotid artery appears patent without significant stenosis bilaterally. Both vertebral arteries are patent without significant stenosis.  IMPRESSION: Subcentimeter focus of acute infarct in the left frontal cortex.  Mild intracranial atherosclerotic disease most prominent in the right middle cerebral artery.  Suboptimal MRA  of the neck without significant carotid or vertebral stenosis in the neck.   Electronically Signed   By: Franchot Gallo M.D.   On: 11/14/2013 20:26   Mr Jeri Cos WJ Contrast  11/14/2013   CLINICAL DATA:  Stroke  EXAM: MRI HEAD WITHOUT AND WITH CONTRAST  MRA HEAD WITHOUT CONTRAST  MRA NECK WITHOUT AND WITH CONTRAST  TECHNIQUE: Multiplanar, multiecho pulse sequences of the brain and surrounding structures were obtained without and with intravenous contrast. Angiographic images of the Circle of Willis were obtained using MRA technique without intravenous contrast. Angiographic images of the neck were obtained using MRA technique without and with intravenous contrast. Carotid stenosis measurements (when applicable) are obtained utilizing NASCET criteria, using the distal internal carotid diameter as the denominator.  CONTRAST:  66mL MULTIHANCE GADOBENATE DIMEGLUMINE 529 MG/ML IV SOLN  COMPARISON:  CT head 11/14/2013  FINDINGS: MRI HEAD FINDINGS  Small area of acute infarct in the left frontal cortex above the sylvian fissure. No other acute infarct identified.  Generalized atrophy. Chronic ischemic changes throughout the white matter. 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 Mild chronic ischemia in the cerebellum bilaterally. Chronic infarct in the left thalamus. Scattered areas of chronic micro hemorrhage bilaterally most likely due to chronic hypertension  Postcontrast imaging reveals no enhancing mass lesion.  MRA HEAD FINDINGS  Both vertebral arteries are patent to the basilar. Right AICA is patent with proximal stenosis. Left PICA patent. Basilar patent. Superior cerebellar arteries show mild disease proximally. Posterior cerebral arteries are patent bilaterally with mild atherosclerotic disease. No significant stenosis  Internal carotid artery widely patent bilaterally. Anterior and middle cerebral arteries are patent bilaterally. Moderate disease in right middle cerebral artery branches.  Negative for  cerebral aneurysm.  MRA NECK FINDINGS  Suboptimal image quality.  Suboptimal timing of the injection.  The internal carotid artery appears patent without significant stenosis bilaterally. Both vertebral arteries are patent without significant stenosis.  IMPRESSION: Subcentimeter focus of acute infarct in the left frontal cortex.  Mild intracranial atherosclerotic disease most prominent in the right middle cerebral artery.  Suboptimal MRA of the neck without significant carotid or vertebral stenosis in the neck.   Electronically Signed   By: Franchot Gallo M.D.   On: 11/14/2013 20:26   Dg Chest Portable 1 View  11/14/2013   CLINICAL DATA:  Altered mental status  EXAM: PORTABLE CHEST - 1 VIEW  COMPARISON:  Prior chest x-ray 11/07/2013  FINDINGS: Stable cardiomegaly. Patient is status post median sternotomy with evidence of prior CABG. Patient is status post median sternotomy with evidence of prior CABG. Atherosclerotic calcifications present within the transverse aorta. Is slightly lower inspiratory volumes.a increased pulmonary vascular congestion now with mild interstitial edema. Kerley B-lines are noted in the periphery of the right lung. No focal airspace consolidation, pleural effusion or pneumothorax. No acute osseous abnormality.  IMPRESSION:  1. Interval development of mild interstitial edema concerning for mild CHF.   Electronically Signed   By: Jacqulynn Cadet M.D.   On: 11/14/2013 10:20    Assessment: Ms. Fischman has an acute left CVA in the setting of new fever. Of course, it is possible that she developed another episode of acute endocarditis despite her chronic amoxicillin therapy causing an embolic stroke. However, she was noted to still have small, strand-like vegetations on her aortic and mitral valves at the time of her last TEE in April. It is possible that she embolized a sterile vegetation and that her fever is due to something else such as a recurrence of C. difficile  colitis.  Plan: 1. Continue current antibiotics for now 2. Await results of blood cultures and C. difficile PCR  Michel Bickers, MD [facility] [place] [phone] pager   [phone] cell 11/15/2013, 2:12 PM

## 2013-11-15 NOTE — Progress Notes (Signed)
[name] OVP:[id number] DOB: Feb 03, 1941 DOA: 11/14/2013 PCP: Hollace Kinnier, DO   Subj:[name] is a 73 y.o. WF PMHx recurrent endocarditis, on amoxicillin for endocarditis prophylaxis, atrial fibrillation on coumadin, CAD, pulmonary fibrosis, h/o c diff colitis, anemia, chronic diastolic heart failure, discharged yesterday, was brought by her daughter after she was found on the floor near the bed . Most of the history available from the daughters at bedside. The daughter who lives with the patient reports she heard a sound in the room and found her lying beside the bed. She was also found to be in sob, wheezing and gasping for air. Her oxygen sats were in low 80's. On arrival to ED, her rectal temp was 101, she was found to have some leukocytosis on lab work. Her CXR and UA did not reveal a source of infection. But she had two loose bm's since yesterday. No rash seen on the skin.  8/22 paged to bedside to inform you patient was being transferred to [facility] [address]. secondary to positive MRI for acute stroke.   Obj: Objective: VITAL SIGNS: Temp: 97.5 F (36.4 C) (08/23 0200) Temp src: Oral (08/22 2214) BP: 110/69 mmHg (08/23 0200) Pulse Rate: 84 (08/23 0200) SPO2; 96% on 2 L O2 via [place] FIO2:   Intake/Output Summary (Last 24 hours) at 11/15/13 0253 Last data filed at 11/14/13 2100  Gross per 24 hour  Intake      0 ml  Output   2225 ml  Net  -2225 ml     Exam: General: A./O. x4, No acute respiratory distress Lungs: Clear to auscultation bilaterally without wheezes or crackles Cardiovascular: Regular rate and rhythm without murmur gallop or rub normal S1 and S2 Abdomen: Nontender, nondistended, soft, bowel sounds positive, no rebound, no ascites, no appreciable mass Extremities: No significant cyanosis, clubbing, or edema bilateral lower extremities Neurologic; cranial nerves II through XII intact, tongue/uvula midline, bilateral upper extremity/lower extremity strength 5/5, sensation  intact throughout, finger nose finger within normal limit bilateral, quick finger touch bilateral within normal limit, did not ambulate patient   Procedure/Significant Events: 8/22 MRI/MRA head without contrast;-Subcentimeter focus of acute infarct in the left frontal cortex.  -No significant carotid or vertebral stenosis in the neck.   Culture   Antibiotics: Zosyn 8/22>> Vancomycin 8/22>>   A/P Acute CVA -Patient stable, neurology has seen patient admission

## 2013-11-15 NOTE — Evaluation (Signed)
Physical Therapy Evaluation Patient Details Name: [name] MRN: [id number] DOB: Aug 20, 1940 Today's Date: 11/15/2013   History of Present Illness  [name] is a 73 y.o. female with prior h/o recurrent endocarditis, on amoxicillin for endocarditis prophylaxis, atrial fibrillation on coumadin, CAD, pulmonary fibrosis, h/o c diff colitis,  anemia, chronic diastolic heart failure, discharged yesterday, was brought by her daughter after she was found on the floor near the bed . Most of the history available from the daughters at bedside. The daughter who lives with the patient reports she heard a sound in the room and found her lying beside the bed. She was also found to be in sob, wheezing and gasping for air. Her oxygen sats were in low 80's. On arrival to ED, her rectal temp was 101, she was found to have some leukocytosis on lab work. Her CXR and UA did not reveal a source of infection. But she had two loose bm's since yesterday. No rash seen on the skin.   Clinical Impression  Pt admitted with fever, dyspnea, fall. Pt currently with functional limitations due to the deficits listed below (see PT Problem List). Pt very lethargic on eval, max A for bed mobility and could not maintain balance sitting EOB. Did not attempt transfer today, unsafe unless lift equipment used. Pt will benefit from skilled PT to increase their independence and safety with mobility to allow discharge to the venue listed below. Recommending SNF at this time given readmission. PT will continue to follow.       Follow Up Recommendations SNF;Supervision/Assistance - 24 hour    Equipment Recommendations  None recommended by PT    Recommendations for Other Services OT consult     Precautions / Restrictions Precautions Precautions: Fall Precaution Comments: pt found on floor beside bed by daughter Restrictions Weight Bearing Restrictions: No      Mobility  Bed Mobility Overal bed mobility: Needs  Assistance Bed Mobility: Supine to Sit;Sit to Supine     Supine to sit: Max assist Sit to supine: Max assist   General bed mobility comments: pt assisted by reaching for rail when cued but max assist required for pt to roll to side and achieve upright sitting. Max A to help pt wt shift to get hips to EOB. Max A for pt to return to supine and get positioned in bed. She was able to assist with pushing bilateral LE's to slide to [facility].   Transfers Overall transfer level: Needs assistance               General transfer comment: encouraged pt to try to sit to stand with therapist but she would not reporting that she was too tired and really could not keep eyes open long enough to safely attempt.   Ambulation/Gait             General Gait Details: NT  Stairs            Wheelchair Mobility    Modified Rankin (Stroke Patients Only)       Balance Overall balance assessment: Needs assistance Sitting-balance support: Feet unsupported;Bilateral upper extremity supported Sitting balance-Leahy Scale: Poor Sitting balance - Comments: pt required assist fluctuating between min-guard and mod to keep balance EOB. Pt leaning to left when eyes closing, twds HOB. maintained sitting x12 mins, kept pt awake with LE ther ex. Pt c/o dizziness, BP stable. Postural control: Left lateral lean  Pertinent Vitals/Pain Pain Assessment: No/denies pain BP 147/90 in sitting O2 sats 96% on 2L O2, down to 89% on RA, O2 replaced    Home Living Family/patient expects to be discharged to:: Private residence Living Arrangements: Children Available Help at Discharge: Family;Available 24 hours/day Type of Home: House Home Access: Ramped entrance     Home Layout: One level Home Equipment: Walker - 2 wheels;Bedside commode;Wheelchair - manual;Shower seat - built in;Grab bars - toilet;Grab bars - tub/shower      Prior Function Level of Independence:  Needs assistance   Gait / Transfers Assistance Needed: pt left hospital ambulating 100' with min-guard A and RW, 11/12/13     Comments: pt with minimal communucation throughout eval     Hand Dominance   Dominant Hand: Right    Extremity/Trunk Assessment   Upper Extremity Assessment: Defer to OT evaluation;Generalized weakness           Lower Extremity Assessment: Generalized weakness;RLE deficits/detail;LLE deficits/detail RLE Deficits / Details: knee ext 3/5, knee flex 3/5, hip flex 2/5 LLE Deficits / Details: knee ext 3/5, knee flex 3/5, hip flex 2-/5  Cervical / Trunk Assessment: Kyphotic  Communication   Communication: Other (comment) (decreased)  Cognition Arousal/Alertness: Lethargic Behavior During Therapy: Flat affect Overall Cognitive Status: Impaired/Different from baseline Area of Impairment: Attention;Following commands;Safety/judgement;Problem solving   Current Attention Level: Focused Memory: Decreased short-term memory Following Commands: Follows one step commands consistently;Follows multi-step commands inconsistently;Follows one step commands with increased time Safety/Judgement: Decreased awareness of safety   Problem Solving: Slow processing;Decreased initiation;Requires verbal cues;Requires tactile cues General Comments: pt very lethargic on eval and having difficulty staying awake to follow commands, appropriate answers to questions when awake    General Comments      Exercises General Exercises - Lower Extremity Ankle Circles/Pumps: AROM;Both;10 reps;Seated Long Arc Quad: AROM;Both;10 reps;Seated      Assessment/Plan    PT Assessment Patient needs continued PT services  PT Diagnosis Difficulty walking;Generalized weakness   PT Problem List Decreased strength;Decreased range of motion;Decreased activity tolerance;Decreased balance;Decreased mobility;Decreased coordination;Decreased cognition;Decreased safety awareness  PT Treatment  Interventions DME instruction;Gait training;Functional mobility training;Therapeutic activities;Therapeutic exercise;Balance training;Cognitive remediation;Patient/family education   PT Goals (Current goals can be found in the Care Plan section) Acute Rehab PT Goals Patient Stated Goal: to go home PT Goal Formulation: With patient Time For Goal Achievement: 11/29/13 Potential to Achieve Goals: Fair    Frequency Min 2X/week   Barriers to discharge   pt came right back after last discharge    Co-evaluation               End of Session Equipment Utilized During Treatment: Oxygen Activity Tolerance: Patient limited by lethargy Patient left: in bed;with bed alarm set;with call bell/phone within reach Nurse Communication: Mobility status         Time: 8016-5537 PT Time Calculation (min): 26 min   Charges:   PT Evaluation $Initial PT Evaluation Tier I: 1 Procedure PT Treatments $Therapeutic Activity: 23-37 mins   PT G Codes:        Leighton Roach, PT  Acute Rehab Services  [place], [place] 11/15/2013, 11:34 AM

## 2013-11-15 NOTE — Progress Notes (Signed)
ANTICOAGULATION CONSULT NOTE - Follow Up Consult  Pharmacy Consult for warfarin Indication: atrial fibrillation  Allergies  Allergen Reactions  . Ceftriaxone     RASH BUT TOLERATED AMPICILLIN AND IMIPENEM WITHOUT PROBLEMS  . Codeine Nausea Only    Patient Measurements: Height: 5\' 6"  (167.6 cm) Weight: 172 lb 14.4 oz (78.427 kg) IBW/kg (Calculated) : 59.3  Vital Signs: Temp: 97.5 F (36.4 C) (08/23 0600) Temp src: Oral (08/23 0600) BP: 106/55 mmHg (08/23 0600) Pulse Rate: 96 (08/23 0600)  Labs:  Recent Labs  11/13/13 0540 11/14/13 0923 11/14/13 1145 11/14/13 1301 11/15/13 0340  HGB  --  12.5  --   --  10.3*  HCT  --  39.3  --   --  30.4*  PLT  --  145*  --   --  102*  LABPROT 26.9*  --  28.5*  --  31.1*  INR 2.49*  --  2.68*  --  3.00*  CREATININE  --  1.08  --  1.15* 1.19*  TROPONINI  --  <0.30  --   --   --     Estimated Creatinine Clearance: 43.1 ml/min (by C-G formula based on Cr of 1.19).  Assessment: 84 yof admitted after a fall at home. She continues on chronic coumadin. INR today is therapeutic at 3 but at the upper end of goal range. H/H and plts are decreased today. No overt bleeding noted. MRI reported with acute stroke.   Goal of Therapy:  INR 2-3   Plan:  1. Warfarin 1mg  PO x 1 tonight 2. F/u AM INR  Adasha Boehme, Rande Lawman 11/15/2013,8:00 AM

## 2013-11-15 NOTE — Progress Notes (Signed)
STROKE TEAM PROGRESS NOTE   HISTORY [name] is an 73 y.o. female history of atrial fibrillation on Coumadin with therapeutic INR, coronary artery disease, hyperlipidemia, hypertension, lupus, and endocarditis, who developed acute onset of inability to speak this afternoon. She was last seen well at 1420 today. At that time she was talking and interacting with staff and family members. She was noted to get 1600 to not be able to speak or follow commands. INR today was 2.68. CT scan of her head showed no acute intracranial abnormality. NIH stroke score was 17. Patient improved and was able to move extremities and follow commands following CT scan. She still is unable to speak however.   LSN: [id number] 11/14/2013  tPA Given: No: INR 2.68, and improved deficits.  MRankin: 3   SUBJECTIVE (INTERVAL HISTORY) No family members present. The patient's aphasia has completely resolved. She stated that she did not speak because she felt tired and did not want to speak. Otherwise, she is fully orientated and follows commands and cooperative with exam.  OBJECTIVE  No results found for this basename: GLUCAP,  in the last 168 hours  Recent Labs Lab 11/11/13 0304 11/12/13 0500 11/14/13 0923 11/14/13 1301 11/15/13 0340  NA 138 139 139 141 140  K 3.9 4.5 5.7* 4.8 3.8  CL 107 109 106 108 108  CO2 17* 16* 15* 15* 18*  GLUCOSE 145* 130* 76 74 89  BUN 29* 34* 40* 42* 42*  CREATININE 1.09 1.03 1.08 1.15* 1.19*  CALCIUM 9.3 9.4 9.3 9.1 8.4    Recent Labs Lab 11/09/13 0947 11/10/13 0234 11/11/13 0304 11/12/13 0500 11/14/13 0923  AST 562* 301* 142* 83* 54*  ALT 1006* 812* 603* 473* 308*  ALKPHOS 119* 133* 146* 150* 144*  BILITOT 2.1* 1.4* 1.2 1.1 2.3*  PROT 5.8* 5.8* 6.1 6.2 7.1  ALBUMIN 2.5* 2.4* 2.6* 2.7* 3.3*    Recent Labs Lab 11/10/13 0234 11/11/13 0304 11/12/13 0500 11/14/13 0923 11/15/13 0340  WBC 4.2 5.7 6.7 14.9* 6.8  NEUTROABS  --   --   --  11.7*  --   HGB 9.6* 10.6* 11.2*  12.5 10.3*  HCT 28.8* 31.2* 33.5* 39.3 30.4*  MCV 95.0 95.7 96.0 97.0 97.4  PLT 49* 62* 81* 145* 102*    Recent Labs Lab 11/14/13 0923  TROPONINI <0.30    Recent Labs  11/13/13 0540 11/14/13 1145 11/15/13 0340  LABPROT 26.9* 28.5* 31.1*  INR 2.49* 2.68* 3.00*    Recent Labs  11/14/13 1020  COLORURINE AMBER*  LABSPEC 1.017  PHURINE 5.5  GLUCOSEU NEGATIVE  HGBUR NEGATIVE  BILIRUBINUR NEGATIVE  KETONESUR NEGATIVE  PROTEINUR 30*  UROBILINOGEN 0.2  NITRITE NEGATIVE  LEUKOCYTESUR NEGATIVE       Component Value Date/Time   CHOL 98 11/15/2013 0340   TRIG 88 11/15/2013 0340   HDL 38* 11/15/2013 0340   HDL 46 04/27/2013 1101   CHOLHDL 2.6 11/15/2013 0340   CHOLHDL 2.7 04/27/2013 1101   VLDL 18 11/15/2013 0340   LDLCALC 42 11/15/2013 0340   LDLCALC 64 04/27/2013 1101   No results found for this basename: HGBA1C   No results found for this basename: labopia, cocainscrnur, labbenz, amphetmu, thcu, labbarb    No results found for this basename: ETH,  in the last 168 hours  Ct Head Wo Contrast 11/14/2013    1. Stable non contrast CT appearance of the brain. No new intracranial abnormality identified.  2. Chronic ischemic disease detailed above.   Ct Head  Wo Contrast 11/14/2013    1. No acute intracranial abnormality.  2. Stable atrophy, remote infarcts involving the right parietal and right occipital regions and advanced chronic microvascular ischemic white matter disease.   CT CSPINE   11/14/2013 1. No acute fracture or malalignment.  2. Multilevel cervical spondylosis and right-sided facet arthropathy.      MRI Head Wo Contrast 11/14/2013    Subcentimeter focus of acute infarct in the left frontal cortex.  Mild intracranial atherosclerotic disease most prominent in the right middle cerebral artery.     Mr Angiogram Neck W Wo Contrast 11/14/2013    Suboptimal MRA of the neck without significant carotid or vertebral stenosis in the neck.      MRA Brain W Wo  Contrast 11/14/2013    Mild intracranial atherosclerotic disease most prominent in the right middle cerebral artery.   Dg Chest Portable 1 View 11/14/2013    1. Interval development of mild interstitial edema concerning for mild CHF.      PHYSICAL EXAM Physical exam  Temp:  [97.5 F (36.4 C)-98.5 F (36.9 C)] 98.5 F (36.9 C) (08/23 1521) Pulse Rate:  [83-98] 92 (08/23 1521) Resp:  [18-21] 20 (08/23 1521) BP: (106-146)/(51-77) 111/74 mmHg (08/23 1521) SpO2:  [93 %-100 %] 100 % (08/23 1521) Weight:  [172 lb 14.4 oz (78.427 kg)] 172 lb 14.4 oz (78.427 kg) (08/22 2214)  General - thin, well developed, mild lethargic but able to follow commands and complete testing.  Ophthalmologic - not able to see through  Cardiovascular - irregularly irregular heart rate.  Mental Status -  Level of arousal and orientation to time, place, and person were intact. Language including expression, naming, repetition, comprehension was assessed and found intact.  Cranial Nerves II - XII - II - Vision intact OU. III, IV, VI - Extraocular movements intact. V - Facial sensation intact bilaterally. VII - right nasolabial fold flattening VIII - Hearing & vestibular intact bilaterally. X - Palate elevates symmetrically. XI - Chin turning & shoulder shrug intact bilaterally. XII - Tongue protrusion intact.  Motor Strength - The patient's strength was 5-/5 UEs and 3/5 LE proximal and 4/5 distal but symmetrical in extremities and pronator drift was absent.  Bulk was normal and fasciculations were absent.   Motor Tone - Muscle tone was assessed at the neck and appendages and was normal.  Reflexes - The patient's reflexes were decreased in all extremities and she had no pathological reflexes.  Sensory - Light touch, temperature/pinprick were assessed and were normal.    Coordination - The patient had normal movements in the hands with no ataxia or dysmetria.  Tremor was absent.  Gait and Station - not  tested due to safety concern.   ASSESSMENT/PLAN  [name] is a 73 y.o. female with complicated hx of paroxysmal atrial fibrillation on chronic Coumadin therapy, coronary artery disease s/p CABG, recurrent endocarditis at MV and prosthetic AV currently on chronic ampicillin prophylaxis therapy, hyperlipidemia, hypertension, anemia, lupus, pulmonary fibrosis, chronic diastolic heart failure, presenting with inability to speak.  She did not receive TPA secondary to improvement in deficits and her Coumadin therapy with therapeutic INR. Her speech was normal today, not sure if she had true aphasia or just did not want to talk as pt stated. MRI showed a subcentimeter focus of diffusion in the left frontal cortex, however, most likely subacute infarct, does not appears to be acute to me.   She is at high risk of stroke due to multiple  risk factors: 1.  she has recurrent endocarditis at MV and AV in the past and currently on prophylatic Abx. She may have vegetations that causing stroke but treatment for that is Abx. She is not a surgical candidate. 2.  she reported to have lupus, not sure if she has antiphospholipid syndrome or libman-sacks endocarditis, but anyway this would not change the management as she is on coumadin and currently INR was between 2.5-3.5. However, for infectious endocarditis, coumadin was contraindicated due to risk of mycotic aneurysm. Therefore, I think INR 2-3 may be safer in this pt. 3. She has afib and on coumadin, currently therapeutic 4. Stroke risk factors of HTN, HLD, CAD  Stroke - subcentimeter subacute infarct in the left frontal cortex. Not sure if she had true aphasia or just did not want to talk as pt stated.   warfarin prior to admission, now on warfarin - INR 3.0 today  MRI - Subcentimeter focus of subacute infarct in the left frontal cortex.   MRA - Mild intracranial atherosclerotic disease most prominent in the right middle cerebral artery.   2D Echo -  09/13/2013 - ejection fraction 55-60%. Vegetations could not be excluded on the aortic or mitral valves. Repeat echo pending.  LDL 42, no statin necessary as meeting goal of LDL <100 ( on Crestor prior to admission - now on Lipitor )  HgbA1c - 5.8 WNL  Warfarin for VTE prophylaxis  Cardiac with thin liquids.   Bedrest  Therapy needs:  Physical therapy recommends SNF  Risk factor management/education  Patient counseled to be compliant with his antithrombotic medications  Disposition:  Physical therapy recommends SNF  Afib on coumadin - INR within the goal - continue coumadin, INR 2-3  Endocarditis - ID and card on board - Blood culture pending - continue Abx prophylaxis - 2D echo pending - even this is the cause for her subacute infarct, no specific treatment except antibiotics. Does not change management overall.   Lupus - may consider to check antiphospholipid syndrome, but this will not change management. Although, it requires INR 2.5-3.5, but due to risk of endocarditis induced mycotic aneurysm, I think INR 2-3 would be safter in this pt. - may consider libman-sacks non-infectious endocarditis, but this will not change management  Hypertension   Home meds: Lopressor 25 mg daily. Resumed in hospital  BP - (106-157)/(51-88) - 106/55  past 24h  SBP goal 130/80  Stable  Patient counseled to be compliant with his blood pressure medications  Hyperlipidemia  LDL 42  Patient on Crestor at home, changed to Lipitor in hospital (formulary)  LDL goal < 100 (<70 for diabetics)  Other Stroke Risk Factors Advanced age   Coronary artery disease Obstructive sleep apnea, on CPCP at home Atrial fibrillation  Other Active Problems  Renal insufficiency  Elevated liver function tests  Anemia  Endocarditis history - on Zosyn and vancomycin - transthoracic echo pending - may need TEE  Other Pertinent History  chronic bronchitis / pulmonary fibrosis / on home oxygen  therapy at night.  Neurology at this point has nothing new to offer, continue coumadin therapy and INR 2-3. Will sign off for now. Please call with questions. Thank you for the interesting consult.   Hospital day # 1  Mikey Bussing PA-C [facility] Pager [phone] 11/15/2013, 9:48 AM  I, the attending vascular neurologist, have personally obtained a history, examined the patient, evaluated laboratory data, individually viewed imaging studies, and formulated the assessment and plan of care.  I have made any additions or clarifications directly to the above note and agree with the findings and plan as currently documented.   Rosalin Hawking, MD PhD Stroke Neurology 11/15/2013 5:48 PM  To contact Stroke Continuity provider, please refer to [email or web address] After hours, contact General Neurology

## 2013-11-15 NOTE — Progress Notes (Signed)
[facility] PROGRESS NOTE  [name] UVO:[id number] DOB: 01-Jun-1940 DOA: 11/14/2013 PCP: Hollace Kinnier, DO  Assessment/Plan: 73 y.o. female PMH of CAD s/p CABG, s/p AVR, h/o CVA, PAF on AC (coumadin), CHF, Pulmonary HTN, Pulmonary Fibrosis, chronic respiratory failure on home oxygen,  Lupus, h/o recurrent endocarditis, on amoxicillin for endocarditis prophylaxis, discharged 8/21 was brought by her daughter after she was found on the floor near the bed with aphasia -Found to have CVA, hypoxia, fever   1. Acute CVA; MRI + Subcentimeter focus of acute infarct in the left frontal cortex -Patient developed CVA while on coumadin with INR 2.6; ? 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 Terrence Dupont patient is likely to have vegetations but unfortunately not a candidate for surgery    2. Acute on chronic CHF with pulmonary HTN; echo (08/2013): LVEF 55%, AR, MR, TR, PA 81 mmHg -CXR: pulmonary edema, clinically volume overloaded;  -Increase lasix BID, I/O, daily weight; cont BB, may need ACE 3. H/o recurrent endocarditis, Pt was on amoxicillin for endocarditis prophylaxis -Leukocytosis; started on IV atx; pend blood cultures; previous cultures neg; consulted ID -d/w cardiology regarding vegetation; Per Dr. Terrence Dupont patient is likely to have vegetations but unfortunately not a candidate for surgery 4. CAD s/p CABG, Pt denies acute chest pain; cont home regimen  5. PAF on AC (coumadin), cont amiodarone, BB, coumadin  6. Pulmonary Fibrosis, chronic respiratory failure on home oxygen; cont bronchodilators prn, oxygen   Prognosis seems poor due to recurrent infective endocarditis, acute on chronic CVA on anticoagulation, acute on chronic CHF, with severe pulmonary HTN;  -D/w patient, updated her daughter    Code Status:  DNR Family Communication: d/w patient, updated  [name] Daughter [phone]   (indicate person spoken with, relationship, and if by phone, the number) Disposition Plan: pend  Clinical imprvement   MRA NECK FINDINGS  Suboptimal image quality. Suboptimal timing of the injection.  The internal carotid artery appears patent without significant  stenosis bilaterally. Both vertebral arteries are patent without  significant stenosis.  IMPRESSION:  Subcentimeter focus of acute infarct in the left frontal cortex.  Mild intracranial atherosclerotic disease most prominent in the  right middle cerebral artery.  Suboptimal MRA of the neck without significant carotid or vertebral  stenosis in the neck.   Consultants:  neuroogy  Procedures:  none  Antibiotics:  vanc 8/22<<<<   Zosyn 8/22<<<<  (indicate start date, and stop date if known)  HPI/Subjective: alert  Objective: Filed Vitals:   11/15/13 1000  BP: 129/74  Pulse: 85  Temp: 97.5 F (36.4 C)  Resp: 18    Intake/Output Summary (Last 24 hours) at 11/15/13 1219 Last data filed at 11/15/13 0600  Gross per 24 hour  Intake      0 ml  Output   2575 ml  Net  -2575 ml   Filed Weights   11/14/13 0917 11/14/13 1400 11/14/13 2214  Weight: 78.472 kg (173 lb) 78.3 kg (172 lb 9.9 oz) 78.427 kg (172 lb 14.4 oz)    Exam:   General:  alert  Cardiovascular: s1,s2 sys mr   Respiratory: BL crackle in LL  Abdomen: soft, nt,nd   Musculoskeletal: no mild edema   Data Reviewed: Basic Metabolic Panel:  Recent Labs Lab 11/11/13 0304 11/12/13 0500 11/14/13 0923 11/14/13 1301 11/15/13 0340  NA 138 139 139 141 140  K 3.9 4.5 5.7* 4.8 3.8  CL 107 109 106 108 108  CO2 17* 16* 15* 15* 18*  GLUCOSE 145* 130* 76 74 89  BUN 29* 34* 40* 42* 42*  CREATININE 1.09 1.03 1.08 1.15* 1.19*  CALCIUM 9.3 9.4 9.3 9.1 8.4   Liver Function Tests:  Recent Labs Lab 11/09/13 0947 11/10/13 0234 11/11/13 0304  11/12/13 0500 11/14/13 0923  AST 562* 301* 142* 83* 54*  ALT 1006* 812* 603* 473* 308*  ALKPHOS 119* 133* 146* 150* 144*  BILITOT 2.1* 1.4* 1.2 1.1 2.3*  PROT 5.8* 5.8* 6.1 6.2 7.1  ALBUMIN 2.5* 2.4* 2.6* 2.7* 3.3*   No results found for this basename: LIPASE, AMYLASE,  in the last 168 hours No results found for this basename: AMMONIA,  in the last 168 hours CBC:  Recent Labs Lab 11/10/13 0234 11/11/13 0304 11/12/13 0500 11/14/13 0923 11/15/13 0340  WBC 4.2 5.7 6.7 14.9* 6.8  NEUTROABS  --   --   --  11.7*  --   HGB 9.6* 10.6* 11.2* 12.5 10.3*  HCT 28.8* 31.2* 33.5* 39.3 30.4*  MCV 95.0 95.7 96.0 97.0 97.4  PLT 49* 62* 81* 145* 102*   Cardiac Enzymes:  Recent Labs Lab 11/14/13 0923  TROPONINI <0.30   BNP (last 3 results)  Recent Labs  09/12/13 1122 11/07/13 1000 11/14/13 0923  PROBNP 16828.0* 24587.0* 13807.0*   CBG: No results found for this basename: GLUCAP,  in the last 168 hours  Recent Results (from the past 240 hour(s))  URINE CULTURE     Status: None   Collection Time    11/07/13  7:56 AM      Result Value Ref Range Status   Specimen Description URINE, CATHETERIZED   Final   Special Requests NONE   Final   Culture  Setup Time     Final   Value: 11/07/2013 18:34     Performed at [facility]     Final   Value: NO GROWTH     Performed at [facility]   Culture     Final   Value: NO GROWTH     Performed at [facility]   Report Status 11/08/2013 FINAL   Final  CULTURE, BLOOD (ROUTINE X 2)     Status: None   Collection Time    11/07/13  8:20 AM      Result Value Ref Range Status   Specimen Description BLOOD LEFT ARM   Final   Special Requests BOTTLES DRAWN AEROBIC ONLY 4CC   Final   Culture  Setup Time     Final   Value: 11/07/2013 18:00     Performed at [facility]   Culture     Final   Value: NO GROWTH 5 DAYS     Performed at [facility]   Report Status 11/13/2013 FINAL   Final   CULTURE, BLOOD (ROUTINE X 2)     Status: None   Collection Time    11/07/13  8:27 AM      Result Value Ref Range Status   Specimen Description BLOOD LEFT HAND   Final   Special Requests BOTTLES DRAWN AEROBIC ONLY 3CC   Final   Culture  Setup Time     Final   Value: 11/07/2013 18:00     Performed at [facility]     Final   Value: NO GROWTH 5 DAYS  Performed at [facility]   Report Status 11/13/2013 FINAL   Final  MRSA PCR SCREENING     Status: None   Collection Time    11/07/13  2:09 PM      Result Value Ref Range Status   MRSA by PCR NEGATIVE  NEGATIVE Final   Comment:            The GeneXpert MRSA Assay (FDA     approved for NASAL specimens     only), is one component of a     comprehensive MRSA colonization     surveillance program. It is not     intended to diagnose MRSA     infection nor to guide or     monitor treatment for     MRSA infections.     Studies: Ct Head Wo Contrast  11/14/2013   CLINICAL DATA:  73 year old female code stroke. Unresponsive with mental status change. Initial encounter.  EXAM: CT HEAD WITHOUT CONTRAST  TECHNIQUE: Contiguous axial images were obtained from the base of the skull through the vertex without intravenous contrast.  COMPARISON:  Head and cervical spine CT 1039 hr the same day and earlier.  FINDINGS: Stable paranasal sinuses and mastoids. No acute osseous abnormality identified. Stable orbit and scalp soft tissues.  Calcified atherosclerosis at the skull base. 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 Left thalamic and right caudate lacunar infarcts also are stable. Patchy and confluent cerebral white matter hypodensity also has not significantly changed. Scattered small lacunar infarcts in both cerebellar hemispheres also appear stable. No midline shift, mass effect, or evidence of intracranial mass lesion. No acute intracranial hemorrhage identified. No  evidence of cortically based acute infarction identified. No suspicious intracranial vascular hyperdensity.  IMPRESSION: 1. Stable non contrast CT appearance of the brain. No new intracranial abnormality identified. 2. Chronic ischemic disease detailed above. Study discussed by telephone with Dr. Wallie Char on 11/14/2013 at 1718 hrs.   Electronically Signed   By: Lars Pinks M.D.   On: 11/14/2013 17:22   Ct Head Wo Contrast  11/14/2013   CLINICAL DATA:  Fall earlier today. Now altered mental status and lethargy. Patient on Coumadin.  EXAM: CT HEAD WITHOUT CONTRAST  CT CERVICAL SPINE WITHOUT CONTRAST  TECHNIQUE: Multidetector CT imaging of the head and cervical spine was performed following the standard protocol without intravenous contrast. Multiplanar CT image reconstructions of the cervical spine were also generated.  COMPARISON:  Prior CT scan of the head and cervical spine 06/27/2013  FINDINGS: CT HEAD FINDINGS  Negative for acute intracranial hemorrhage, acute infarction, mass, mass effect, hydrocephalus or midline shift. Gray-white differentiation is preserved throughout. A stable appearance of atrophy, prior right parietal infarct, prior right occipital infarct and extensive sequelae of longstanding chronic microvascular ischemic white matter disease. Stable ventricular configuration. No acute scalp hematoma or calvarial fracture. Normal aeration of the mastoid air cells. Mild mucoperiosteal thickening in the inferior aspect of the maxillary sinuses. Atherosclerosis in the bilateral cavernous carotid arteries.  CT CERVICAL SPINE FINDINGS  No acute fracture, malalignment or prevertebral soft tissue swelling. Multilevel degenerative disc disease. Disc space narrowing most prominent at C5-C6. Multilevel facet arthropathy on the right at C3-C4 and C4-C5. The is Unremarkable CT appearance of the thyroid gland. No acute soft tissue abnormality. The lung apices are unremarkable.  IMPRESSION: CT HEAD  1. No  acute intracranial abnormality. 2. Stable atrophy, remote infarcts involving the right parietal and right occipital regions  and advanced chronic microvascular ischemic white matter disease. CT CSPINE  1. No acute fracture or malalignment. 2. Multilevel cervical spondylosis and right-sided facet arthropathy.   Electronically Signed   By: Jacqulynn Cadet M.D.   On: 11/14/2013 11:34   Ct Cervical Spine Wo Contrast  11/14/2013   CLINICAL DATA:  Fall earlier today. Now altered mental status and lethargy. Patient on Coumadin.  EXAM: CT HEAD WITHOUT CONTRAST  CT CERVICAL SPINE WITHOUT CONTRAST  TECHNIQUE: Multidetector CT imaging of the head and cervical spine was performed following the standard protocol without intravenous contrast. Multiplanar CT image reconstructions of the cervical spine were also generated.  COMPARISON:  Prior CT scan of the head and cervical spine 06/27/2013  FINDINGS: CT HEAD FINDINGS  Negative for acute intracranial hemorrhage, acute infarction, mass, mass effect, hydrocephalus or midline shift. Gray-white differentiation is preserved throughout. A stable appearance of atrophy, prior right parietal infarct, prior right occipital infarct and extensive sequelae of longstanding chronic microvascular ischemic white matter disease. Stable ventricular configuration. No acute scalp hematoma or calvarial fracture. Normal aeration of the mastoid air cells. Mild mucoperiosteal thickening in the inferior aspect of the maxillary sinuses. Atherosclerosis in the bilateral cavernous carotid arteries.  CT CERVICAL SPINE FINDINGS  No acute fracture, malalignment or prevertebral soft tissue swelling. Multilevel degenerative disc disease. Disc space narrowing most prominent at C5-C6. Multilevel facet arthropathy on the right at C3-C4 and C4-C5. The is Unremarkable CT appearance of the thyroid gland. No acute soft tissue abnormality. The lung apices are unremarkable.  IMPRESSION: CT HEAD  1. No acute  intracranial abnormality. 2. Stable atrophy, remote infarcts involving the right parietal and right occipital regions and advanced chronic microvascular ischemic white matter disease. CT CSPINE  1. No acute fracture or malalignment. 2. Multilevel cervical spondylosis and right-sided facet arthropathy.   Electronically Signed   By: Jacqulynn Cadet M.D.   On: 11/14/2013 11:34   Mr Jodene Nam Head Wo Contrast  11/14/2013   CLINICAL DATA:  Stroke  EXAM: MRI HEAD WITHOUT AND WITH CONTRAST  MRA HEAD WITHOUT CONTRAST  MRA NECK WITHOUT AND WITH CONTRAST  TECHNIQUE: Multiplanar, multiecho pulse sequences of the brain and surrounding structures were obtained without and with intravenous contrast. Angiographic images of the Circle of Willis were obtained using MRA technique without intravenous contrast. Angiographic images of the neck were obtained using MRA technique without and with intravenous contrast. Carotid stenosis measurements (when applicable) are obtained utilizing NASCET criteria, using the distal internal carotid diameter as the denominator.  CONTRAST:  63mL MULTIHANCE GADOBENATE DIMEGLUMINE 529 MG/ML IV SOLN  COMPARISON:  CT head 11/14/2013  FINDINGS: MRI HEAD FINDINGS  Small area of acute infarct in the left frontal cortex above the sylvian fissure. No other acute infarct identified.  Generalized atrophy. Chronic ischemic changes throughout the white matter. 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 Mild chronic ischemia in the cerebellum bilaterally. Chronic infarct in the left thalamus. Scattered areas of chronic micro hemorrhage bilaterally most likely due to chronic hypertension  Postcontrast imaging reveals no enhancing mass lesion.  MRA HEAD FINDINGS  Both vertebral arteries are patent to the basilar. Right AICA is patent with proximal stenosis. Left PICA patent. Basilar patent. Superior cerebellar arteries show mild disease proximally. Posterior cerebral arteries are patent bilaterally with mild  atherosclerotic disease. No significant stenosis  Internal carotid artery widely patent bilaterally. Anterior and middle cerebral arteries are patent bilaterally. Moderate disease in right middle cerebral artery branches.  Negative for cerebral aneurysm.  MRA NECK  FINDINGS  Suboptimal image quality.  Suboptimal timing of the injection.  The internal carotid artery appears patent without significant stenosis bilaterally. Both vertebral arteries are patent without significant stenosis.  IMPRESSION: Subcentimeter focus of acute infarct in the left frontal cortex.  Mild intracranial atherosclerotic disease most prominent in the right middle cerebral artery.  Suboptimal MRA of the neck without significant carotid or vertebral stenosis in the neck.   Electronically Signed   By: Franchot Gallo M.D.   On: 11/14/2013 20:26   Mr Angiogram Neck W Wo Contrast  11/14/2013   CLINICAL DATA:  Stroke  EXAM: MRI HEAD WITHOUT AND WITH CONTRAST  MRA HEAD WITHOUT CONTRAST  MRA NECK WITHOUT AND WITH CONTRAST  TECHNIQUE: Multiplanar, multiecho pulse sequences of the brain and surrounding structures were obtained without and with intravenous contrast. Angiographic images of the Circle of Willis were obtained using MRA technique without intravenous contrast. Angiographic images of the neck were obtained using MRA technique without and with intravenous contrast. Carotid stenosis measurements (when applicable) are obtained utilizing NASCET criteria, using the distal internal carotid diameter as the denominator.  CONTRAST:  71mL MULTIHANCE GADOBENATE DIMEGLUMINE 529 MG/ML IV SOLN  COMPARISON:  CT head 11/14/2013  FINDINGS: MRI HEAD FINDINGS  Small area of acute infarct in the left frontal cortex above the sylvian fissure. No other acute infarct identified.  Generalized atrophy. Chronic ischemic changes throughout the white matter. 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 Mild chronic ischemia in the cerebellum bilaterally. Chronic  infarct in the left thalamus. Scattered areas of chronic micro hemorrhage bilaterally most likely due to chronic hypertension  Postcontrast imaging reveals no enhancing mass lesion.  MRA HEAD FINDINGS  Both vertebral arteries are patent to the basilar. Right AICA is patent with proximal stenosis. Left PICA patent. Basilar patent. Superior cerebellar arteries show mild disease proximally. Posterior cerebral arteries are patent bilaterally with mild atherosclerotic disease. No significant stenosis  Internal carotid artery widely patent bilaterally. Anterior and middle cerebral arteries are patent bilaterally. Moderate disease in right middle cerebral artery branches.  Negative for cerebral aneurysm.  MRA NECK FINDINGS  Suboptimal image quality.  Suboptimal timing of the injection.  The internal carotid artery appears patent without significant stenosis bilaterally. Both vertebral arteries are patent without significant stenosis.  IMPRESSION: Subcentimeter focus of acute infarct in the left frontal cortex.  Mild intracranial atherosclerotic disease most prominent in the right middle cerebral artery.  Suboptimal MRA of the neck without significant carotid or vertebral stenosis in the neck.   Electronically Signed   By: Franchot Gallo M.D.   On: 11/14/2013 20:26   Mr Jeri Cos LO Contrast  11/14/2013   CLINICAL DATA:  Stroke  EXAM: MRI HEAD WITHOUT AND WITH CONTRAST  MRA HEAD WITHOUT CONTRAST  MRA NECK WITHOUT AND WITH CONTRAST  TECHNIQUE: Multiplanar, multiecho pulse sequences of the brain and surrounding structures were obtained without and with intravenous contrast. Angiographic images of the Circle of Willis were obtained using MRA technique without intravenous contrast. Angiographic images of the neck were obtained using MRA technique without and with intravenous contrast. Carotid stenosis measurements (when applicable) are obtained utilizing NASCET criteria, using the distal internal carotid diameter as the  denominator.  CONTRAST:  26mL MULTIHANCE GADOBENATE DIMEGLUMINE 529 MG/ML IV SOLN  COMPARISON:  CT head 11/14/2013  FINDINGS: MRI HEAD FINDINGS  Small area of acute infarct in the left frontal cortex above the sylvian fissure. No other acute infarct identified.  Generalized atrophy. Chronic ischemic changes throughout the  white matter. 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 Mild chronic ischemia in the cerebellum bilaterally. Chronic infarct in the left thalamus. Scattered areas of chronic micro hemorrhage bilaterally most likely due to chronic hypertension  Postcontrast imaging reveals no enhancing mass lesion.  MRA HEAD FINDINGS  Both vertebral arteries are patent to the basilar. Right AICA is patent with proximal stenosis. Left PICA patent. Basilar patent. Superior cerebellar arteries show mild disease proximally. Posterior cerebral arteries are patent bilaterally with mild atherosclerotic disease. No significant stenosis  Internal carotid artery widely patent bilaterally. Anterior and middle cerebral arteries are patent bilaterally. Moderate disease in right middle cerebral artery branches.  Negative for cerebral aneurysm.  MRA NECK FINDINGS  Suboptimal image quality.  Suboptimal timing of the injection.  The internal carotid artery appears patent without significant stenosis bilaterally. Both vertebral arteries are patent without significant stenosis.  IMPRESSION: Subcentimeter focus of acute infarct in the left frontal cortex.  Mild intracranial atherosclerotic disease most prominent in the right middle cerebral artery.  Suboptimal MRA of the neck without significant carotid or vertebral stenosis in the neck.   Electronically Signed   By: Franchot Gallo M.D.   On: 11/14/2013 20:26   Dg Chest Portable 1 View  11/14/2013   CLINICAL DATA:  Altered mental status  EXAM: PORTABLE CHEST - 1 VIEW  COMPARISON:  Prior chest x-ray 11/07/2013  FINDINGS: Stable cardiomegaly. Patient is status post median  sternotomy with evidence of prior CABG. Patient is status post median sternotomy with evidence of prior CABG. Atherosclerotic calcifications present within the transverse aorta. Is slightly lower inspiratory volumes.a increased pulmonary vascular congestion now with mild interstitial edema. Kerley B-lines are noted in the periphery of the right lung. No focal airspace consolidation, pleural effusion or pneumothorax. No acute osseous abnormality.  IMPRESSION: 1. Interval development of mild interstitial edema concerning for mild CHF.   Electronically Signed   By: Jacqulynn Cadet M.D.   On: 11/14/2013 10:20    Scheduled Meds: . amiodarone  200 mg Oral Daily  . atorvastatin  20 mg Oral q1800  . famotidine  20 mg Oral BID  . furosemide  20 mg Intravenous Daily  . gabapentin  300 mg Oral QHS  . hydroxychloroquine  200 mg Oral BID  . metoprolol tartrate  25 mg Oral Daily  . mirtazapine  7.5 mg Oral QHS  . piperacillin-tazobactam (ZOSYN)  IV  3.375 g Intravenous Q8H  . saccharomyces boulardii  250 mg Oral BID  . vancomycin  750 mg Intravenous Q12H  . warfarin  1 mg Oral ONCE-1800  . Warfarin - Pharmacist Dosing Inpatient   Does not apply q1800   Continuous Infusions:   Active Problems:   CORONARY ARTERY DISEASE   Atrial fibrillation   CORONARY ARTERY BYPASS GRAFT, HX OF   Endocarditis   Protein-calorie malnutrition, severe   CKD (chronic kidney disease) stage 3, GFR 30-59 ml/min   Fever   CVA (cerebral vascular accident)   Aphasia    Time spent: >35 minutes     Kinnie Feil  [facility] Pager [phone]. If 7PM-7AM, please contact night-coverage at [email or web address], password [facility] 11/15/2013, 12:19 PM  LOS: 1 day

## 2013-11-16 ENCOUNTER — Inpatient Hospital Stay (HOSPITAL_COMMUNITY): Payer: Medicare Other

## 2013-11-16 DIAGNOSIS — Z8719 Personal history of other diseases of the digestive system: Secondary | ICD-10-CM

## 2013-11-16 DIAGNOSIS — I369 Nonrheumatic tricuspid valve disorder, unspecified: Secondary | ICD-10-CM

## 2013-11-16 DIAGNOSIS — A0472 Enterocolitis due to Clostridium difficile, not specified as recurrent: Secondary | ICD-10-CM

## 2013-11-16 DIAGNOSIS — I6789 Other cerebrovascular disease: Secondary | ICD-10-CM

## 2013-11-16 LAB — PROTIME-INR
INR: 2.87 — AB (ref 0.00–1.49)
PROTHROMBIN TIME: 30.1 s — AB (ref 11.6–15.2)

## 2013-11-16 MED ORDER — FAMOTIDINE 20 MG PO TABS
20.0000 mg | ORAL_TABLET | Freq: Every day | ORAL | Status: DC
  Administered 2013-11-17 – 2013-11-19 (×3): 20 mg via ORAL
  Filled 2013-11-16 (×3): qty 1

## 2013-11-16 MED ORDER — VANCOMYCIN HCL 500 MG IV SOLR
500.0000 mg | Freq: Two times a day (BID) | INTRAVENOUS | Status: DC
  Filled 2013-11-16: qty 500

## 2013-11-16 MED ORDER — POTASSIUM CHLORIDE CRYS ER 20 MEQ PO TBCR
20.0000 meq | EXTENDED_RELEASE_TABLET | Freq: Two times a day (BID) | ORAL | Status: DC
  Administered 2013-11-16: 20 meq via ORAL
  Filled 2013-11-16 (×3): qty 1

## 2013-11-16 MED ORDER — ENSURE COMPLETE PO LIQD
237.0000 mL | Freq: Three times a day (TID) | ORAL | Status: DC
  Administered 2013-11-16 – 2013-11-19 (×4): 237 mL via ORAL

## 2013-11-16 MED ORDER — WARFARIN SODIUM 2 MG PO TABS
2.0000 mg | ORAL_TABLET | Freq: Once | ORAL | Status: AC
  Administered 2013-11-16: 2 mg via ORAL
  Filled 2013-11-16: qty 1

## 2013-11-16 MED ORDER — FUROSEMIDE 40 MG PO TABS
60.0000 mg | ORAL_TABLET | ORAL | Status: AC
  Administered 2013-11-16: 60 mg via ORAL
  Filled 2013-11-16: qty 1

## 2013-11-16 MED ORDER — POTASSIUM CHLORIDE CRYS ER 20 MEQ PO TBCR
40.0000 meq | EXTENDED_RELEASE_TABLET | Freq: Once | ORAL | Status: AC
  Administered 2013-11-16: 40 meq via ORAL
  Filled 2013-11-16: qty 2

## 2013-11-16 MED ORDER — SODIUM CHLORIDE 0.9 % IJ SOLN
10.0000 mL | INTRAMUSCULAR | Status: DC | PRN
  Administered 2013-11-17: 10 mL
  Administered 2013-11-18 – 2013-11-19 (×2): 20 mL

## 2013-11-16 NOTE — Progress Notes (Signed)
ANTICOAGULATION CONSULT NOTE - Follow Up Consult  Pharmacy Consult for warfarin Indication: atrial fibrillation  Allergies  Allergen Reactions  . Ceftriaxone     RASH BUT TOLERATED AMPICILLIN AND IMIPENEM WITHOUT PROBLEMS  . Codeine Nausea Only    Patient Measurements: Height: 5\' 6"  (167.6 cm) Weight: 172 lb 14.4 oz (78.427 kg) IBW/kg (Calculated) : 59.3  Vital Signs: Temp: 97.7 F (36.5 C) (08/24 0400) Temp src: Oral (08/24 0400) BP: 156/85 mmHg (08/24 0400) Pulse Rate: 105 (08/24 0400)  Labs:  Recent Labs  11/14/13 0923 11/14/13 1145 11/14/13 1301 11/15/13 0340 11/16/13 0330  HGB 12.5  --   --  10.3*  --   HCT 39.3  --   --  30.4*  --   PLT 145*  --   --  102*  --   LABPROT  --  28.5*  --  31.1* 30.1*  INR  --  2.68*  --  3.00* 2.87*  CREATININE 1.08  --  1.15* 1.19*  --   TROPONINI <0.30  --   --   --   --     Estimated Creatinine Clearance: 43.1 ml/min (by C-G formula based on Cr of 1.19).  Assessment: 24 yof admitted after a fall at home. She continues on chronic coumadin.  INR = 2.87   Goal of Therapy:  INR 2-3   Plan:  1. Warfarin 2 mg PO x 1 tonight 2. F/u AM INR  Thank you. Anette Guarneri, PharmD [phone]  11/16/2013,10:29 AM

## 2013-11-16 NOTE — Evaluation (Signed)
Speech Language Pathology Evaluation Patient Details Name: [name] MRN: [id number] DOB: 1940/07/06 Today's Date: 11/16/2013 Time: 2409-7353 SLP Time Calculation (min): 16 min  Problem List:  Patient Active Problem List   Diagnosis Date Noted  . Diarrhea 11/15/2013  . Fever 11/14/2013  . CVA (cerebral vascular accident) 11/14/2013  . Aphasia 11/14/2013  . Respiratory distress 11/07/2013  . Severe sepsis 11/07/2013  . Chronic systolic heart failure 29/92/4268  . History of Clostridium difficile colitis 09/12/2013  . Low back pain 07/03/2013  . Failure to thrive in adult 07/03/2013  . Severe low back pain 06/01/2013  . CKD (chronic kidney disease) stage 3, GFR 30-59 ml/min 02/02/2013  . Lupus 12/17/2012  . Protein-calorie malnutrition, severe 12/13/2012  . Unintentional weight loss 12/09/2012  . 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 History of non-ST elevation myocardial infarction (NSTEMI) 02/04/2012  . 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 Anemia associated with acute blood loss 04/06/2011  . 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 Atrial fibrillation 12/13/2009  . HYPERLIPIDEMIA 01/27/2007  . RESTLESS LEG SYNDROME, MILD 01/27/2007  . CORONARY ARTERY DISEASE 01/27/2007  . 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 GERD 01/27/2007  . CORONARY ARTERY BYPASS GRAFT, HX OF 01/27/2007   Past Medical History:  Past Medical History  Diagnosis Date  . Coronary artery disease   . Atrial fibrillation     Now NSR  . GERD (gastroesophageal reflux disease)   . Hyperlipidemia   . 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 Anemia   . Angina   . Hypertension   . Blood transfusion     "related to bad case of bronchitis once; 1 yr ago given due to GIB" (12/17/2012)  . H/O hiatal hernia   . Lupus     "that's compromised her lungs somewhat" (12/17/2012)  . Insomnia   . Endocarditis   . Prosthetic valve endocarditis   . Enterococcal  bacteremia   . Heart murmur   . CHF (congestive heart failure)   . Pneumonia     "once or twice" (12/17/2012)  . Chronic bronchitis     "used to get it once/yr; hasn't had it since ~ 1986" (12/17/2012)  . Shortness of breath     "all the times sometimes" (12/17/2012)  . On home oxygen therapy     "2L prn; always at night" (12/17/2012)  . Lower GI bleed ~ 01/2012  . Arthritis     "hands" (12/17/2012)  . 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 Right bundle branch block 11/07/2013   Past Surgical History:  Past Surgical History  Procedure Laterality Date  . Givens capsule study  04/09/2011    Procedure: GIVENS CAPSULE STUDY;  Surgeon: Beryle Beams, MD;  Location: [facility];  Service: Endoscopy;  Laterality: N/A;  . Tee without cardioversion  02/06/2012    Procedure: TRANSESOPHAGEAL ECHOCARDIOGRAM (TEE);  Surgeon: Birdie Riddle, MD;  Location[facility] ENDOSCOPY;  Service: Cardiovascular;  Laterality: N/A;  . Esophagogastroduodenoscopy  02/12/2012    Procedure: ESOPHAGOGASTRODUODENOSCOPY (EGD);  Surgeon: Beryle Beams, MD;  Location: [facility] ENDOSCOPY;  Service: Endoscopy;  Laterality: N/A;  . Tee without cardioversion N/A 07/15/2012    Procedure: TRANSESOPHAGEAL ECHOCARDIOGRAM (TEE);  Surgeon: Birdie Riddle, MD;  Location: [facility] ENDOSCOPY;  Service: Cardiovascular;  Laterality: N/A;  . Breast lumpectomy Left 1977    "benign" (12/17/2012)  . Tonsillectomy  1940's  . Coronary artery bypass graft  2006  . 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   . Cardiac catheterization      "probably 2-3" (12/17/2012)  . Coronary angioplasty with stent placement      "several put in over the years" (12/17/2012)  . Cataract extraction w/ intraocular lens  implant, bilateral Bilateral 11/2000  . Peripherally inserted central catheter insertion Right 12/15/2012    "upper arm" (12/17/2012   HPI:  [name] is a 73 y.o. female with prior h/o recurrent  endocarditis, on amoxicillin for endocarditis prophylaxis, atrial fibrillation on coumadin, CAD, pulmonary fibrosis, h/o c diff colitis,  anemia, chronic diastolic heart failure, discharged yesterday, was brought by her daughter after she was found on the floor near the bed . Most of the history available from the daughters at bedside. The daughter who lives with the patient reports she heard a sound in the room and found her lying beside the bed. She was also found to be in sob, wheezing and gasping for air. Her oxygen sats were in low 80's. On arrival to ED, her rectal temp was 101, she was found to have some leukocytosis on lab work. Her CXR and UA did not reveal a source of infection.    Assessment / Plan / Recommendation Clinical Impression  Patient exhibits a mild word-finding deficit, which pt and family report has essentially resolved since admission, to her baseline level.  Pt is able to communicate her wants, needs, and feelings effectively in conversational speech, and is quite witty, joking with staff and her daughters.  All are in agreement that she does not need f/u ST at this time.    SLP Assessment  Patient does not need any further Speech Lanaguage Pathology Services    Follow Up Recommendations  24 hour supervision/assistance    Frequency and Duration        Pertinent Vitals/Pain Pain Assessment: No/denies pain   SLP Goals     SLP Evaluation Prior Functioning  Cognitive/Linguistic Baseline: Baseline deficits Baseline deficit details: Pt and family report pt had occassional word-finding deficits PTA. Type of Home: House  Lives With: Daughter Available Help at Discharge: Family;Available 24 hours/day Vocation: Retired   Associate Professor  Overall Cognitive Status: Within Functional Limits for tasks assessed Arousal/Alertness: Awake/alert Orientation Level: Oriented to person;Oriented to place;Oriented to situation;Disoriented to time Memory: Appears intact Awareness: Appears  intact Problem Solving: Appears intact    Comprehension  Auditory Comprehension Overall Auditory Comprehension: Appears within functional limits for tasks assessed Yes/No Questions: Within Functional Limits Commands: Within Functional Limits Conversation: Simple Reading Comprehension Reading Status: Not tested    Expression Expression Primary Mode of Expression: Verbal Verbal Expression Overall Verbal Expression: Impaired at baseline Initiation: No impairment Level of Generative/Spontaneous Verbalization: Conversation Repetition: No impairment Naming: Impairment Responsive: 76-100% accurate Confrontation: Within functional limits Convergent: 75-100% accurate Divergent: Not tested Effective Techniques: Phonemic cues Written Expression Dominant Hand: Right Written Expression: Not tested   Oral / Motor Oral Motor/Sensory Function Overall Oral Motor/Sensory Function: Impaired Labial ROM: Reduced right Labial Symmetry: Abnormal symmetry right Motor Speech Overall Motor Speech: Appears within functional limits for tasks assessed Respiration: Within functional limits Phonation: Normal Resonance: Within functional limits Articulation: Within functional limitis Intelligibility: Intelligible Motor Planning: Witnin functional limits   GO     Quinn Axe T 11/16/2013, 3:59 PM

## 2013-11-16 NOTE — Progress Notes (Signed)
OT Cancellation Note  Patient Details Name: [name] MRN: [id number] DOB: 07-Nov-1940   Cancelled Treatment:    Reason Eval/Treat Not Completed: Fatigue/lethargy limiting ability to participate (Pt politely declining, will continue to follow.)  [name] 11/16/2013, 3:06 PM

## 2013-11-16 NOTE — Progress Notes (Signed)
Peripherally Inserted Central Catheter/Midline Placement  The IV Nurse has discussed with the patient and/or persons authorized to consent for the patient, the purpose of this procedure and the potential benefits and risks involved with this procedure.  The benefits include less needle sticks, lab draws from the catheter and patient may be discharged home with the catheter.  Risks include, but not limited to, infection, bleeding, blood clot (thrombus formation), and puncture of an artery; nerve damage and irregular heat beat.  Alternatives to this procedure were also discussed.  Consent obtained from daughter by Jacobo Forest, RN due to patient being lethargic.  PICC/Midline Placement Documentation        Ariba Lehnen, Nicolette Bang 11/16/2013, 11:36 AM

## 2013-11-16 NOTE — Progress Notes (Signed)
Pt is having redness and warmth around her old IV access sites (Rt forearm and left hand).  Family members stated they were worried and wanted the doctor to take a further look.  Dr. Daleen Bo notified and he stated he will be around to assess her.  Will continue to monitor.

## 2013-11-16 NOTE — Care Management Note (Addendum)
    Page 1 of 2   11/19/2013     4:56:16 PM CARE MANAGEMENT NOTE 11/19/2013  Patient:  [name]   Account Number:  [id number]  Date Initiated:  11/16/2013  Documentation initiated by:  Valerie [name]  Subjective/Objective Assessment:   Pt admitted for fevers and confusion. Pt is from home with daughter and plans to return once stable.     Action/Plan:   Pt is active with [facility] for RN,PT/OT and will need resumption ordders once stable.   Anticipated DC Date:  11/18/2013   Anticipated DC Plan:  [place]  CM consult      [facility] Choice  HOME HEALTH  Resumption Of Svcs/PTA Provider   Choice offered to / List presented to:  [name]        [place] arranged  HH-1 RN  [place] OT      [place].   Status of service:  Completed, signed off Medicare Important Message given?  YES (If response is "NO", the following Medicare IM given date fields will be blank) Date Medicare IM given:  11/16/2013 Medicare IM given by:  Valerie [name] Date Additional Medicare IM given:  11/19/2013 Additional Medicare IM given by:  [name]  Discharge Disposition:  [employer]  Per UR Regulation:  Reviewed for med. necessity/level of care/duration of stay  If discussed at [place] of Stay Meetings, dates discussed:   11/19/2013    Comments:  11-18-13 [place], [place] [phone] Per family and pt now agreeable for SNF. CSW did speak to family in ref to disposition needs. CM will continue to monitor.

## 2013-11-16 NOTE — Progress Notes (Signed)
[facility] for Infectious Disease  Date of Admission:  11/14/2013  Antibiotics: vancomycin zosyn  Subjective: Sleepy, no significant diarreha but C diff positive  Objective: Temp:  [97.7 F (36.5 C)-99.1 F (37.3 C)] 97.7 F (36.5 C) (08/24 0400) Pulse Rate:  [92-105] 105 (08/24 0400) Resp:  [18-23] 18 (08/24 0400) BP: (111-156)/(74-88) 156/85 mmHg (08/24 0400) SpO2:  [93 %-100 %] 100 % (08/24 0400)  General: sleepy, nad Skin: no rashes Lungs: CTA Cor: RRR Abdomen: soft, nt, nd   Lab Results Lab Results  Component Value Date   WBC 6.8 11/15/2013   HGB 10.3* 11/15/2013   HCT 30.4* 11/15/2013   MCV 97.4 11/15/2013   PLT 102* 11/15/2013    Lab Results  Component Value Date   CREATININE 1.19* 11/15/2013   BUN 42* 11/15/2013   NA 140 11/15/2013   K 3.8 11/15/2013   CL 108 11/15/2013   CO2 18* 11/15/2013    Lab Results  Component Value Date   ALT 308* 11/14/2013   AST 54* 11/14/2013   ALKPHOS 144* 11/14/2013   BILITOT 2.3* 11/14/2013      Microbiology: Recent Results (from the past 240 hour(s))  URINE CULTURE     Status: None   Collection Time    11/07/13  7:56 AM      Result Value Ref Range Status   Specimen Description URINE, CATHETERIZED   Final   Special Requests NONE   Final   Culture  Setup Time     Final   Value: 11/07/2013 18:34     Performed at [facility]     Final   Value: NO GROWTH     Performed at [facility]   Culture     Final   Value: NO GROWTH     Performed at [facility]   Report Status 11/08/2013 FINAL   Final  CULTURE, BLOOD (ROUTINE X 2)     Status: None   Collection Time    11/07/13  8:20 AM      Result Value Ref Range Status   Specimen Description BLOOD LEFT ARM   Final   Special Requests BOTTLES DRAWN AEROBIC ONLY 4CC   Final   Culture  Setup Time     Final   Value: 11/07/2013 18:00     Performed at [facility]   Culture     Final   Value: NO GROWTH 5 DAYS     Performed at  [facility]   Report Status 11/13/2013 FINAL   Final  CULTURE, BLOOD (ROUTINE X 2)     Status: None   Collection Time    11/07/13  8:27 AM      Result Value Ref Range Status   Specimen Description BLOOD LEFT HAND   Final   Special Requests BOTTLES DRAWN AEROBIC ONLY 3CC   Final   Culture  Setup Time     Final   Value: 11/07/2013 18:00     Performed at [facility]   Culture     Final   Value: NO GROWTH 5 DAYS     Performed at [facility]   Report Status 11/13/2013 FINAL   Final  MRSA PCR SCREENING     Status: None   Collection Time    11/07/13  2:09 PM      Result Value Ref Range Status   MRSA by PCR NEGATIVE  NEGATIVE Final   Comment:  The GeneXpert MRSA Assay (FDA     approved for NASAL specimens     only), is one component of a     comprehensive MRSA colonization     surveillance program. It is not     intended to diagnose MRSA     infection nor to guide or     monitor treatment for     MRSA infections.  CULTURE, BLOOD (ROUTINE X 2)     Status: None   Collection Time    11/14/13  9:48 AM      Result Value Ref Range Status   Specimen Description BLOOD LEFT HAND   Final   Special Requests BOTTLES DRAWN AEROBIC ONLY 5ML   Final   Culture  Setup Time     Final   Value: 11/14/2013 20:00     Performed at [facility]   Culture     Final   Value:        BLOOD CULTURE RECEIVED NO GROWTH TO DATE CULTURE WILL BE HELD FOR 5 DAYS BEFORE ISSUING A FINAL NEGATIVE REPORT     Performed at [facility]   Report Status PENDING   Incomplete  CULTURE, BLOOD (ROUTINE X 2)     Status: None   Collection Time    11/14/13 11:45 AM      Result Value Ref Range Status   Specimen Description BLOOD RIGHT HAND   Final   Special Requests BOTTLES DRAWN AEROBIC AND ANAEROBIC B 5CC R 4CC   Final   Culture  Setup Time     Final   Value: 11/15/2013 00:09     Performed at [facility]   Culture     Final   Value:        BLOOD CULTURE  RECEIVED NO GROWTH TO DATE CULTURE WILL BE HELD FOR 5 DAYS BEFORE ISSUING A FINAL NEGATIVE REPORT     Performed at [facility]   Report Status PENDING   Incomplete  CLOSTRIDIUM DIFFICILE BY PCR     Status: Abnormal   Collection Time    11/15/13  2:34 PM      Result Value Ref Range Status   C difficile by pcr POSITIVE (*) NEGATIVE Final   Comment: CRITICAL RESULT CALLED TO, READ BACK BY AND VERIFIED WITH:     LATOYA SILVA 1615 BY WOOLLENK 11/15/13    Studies/Results: Dg Chest 2 View  11/15/2013   CLINICAL DATA:  Suspect pneumonia; history of CHF  EXAM: CHEST  2 VIEW  COMPARISON:  .  Portable chest x-ray of November 14, 2013  FINDINGS: The right lung is less well inflated today. The interstitial markings are increased bilaterally. The cardiopericardial silhouette is enlarged. The pulmonary vascularity is indistinct. There is dense calcification in the mitral valvular annulus. A trace of pleural fluid blunts the costophrenic angles bilaterally. The patient has undergone previous median sternotomy. The bony thorax is unremarkable on the frontal film. On the lateral film the thoracic spine cannot be adequately assessed.  IMPRESSION: Mildly increased interstitial markings bilaterally are slightly more conspicuous today. This may reflect worsening of CHF. No discrete alveolar pneumonia is demonstrated.   Electronically Signed   By: David  Martinique   On: 11/15/2013 13:54   Ct Head Wo Contrast  11/14/2013   CLINICAL DATA:  73 year old female code stroke. Unresponsive with mental status change. Initial encounter.  EXAM: CT HEAD WITHOUT CONTRAST  TECHNIQUE: Contiguous axial images were obtained from the base of the skull  through the vertex without intravenous contrast.  COMPARISON:  Head and cervical spine CT 1039 hr the same day and earlier.  FINDINGS: Stable paranasal sinuses and mastoids. No acute osseous abnormality identified. Stable orbit and scalp soft tissues.  Calcified atherosclerosis at the  skull base. 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 Left thalamic and right caudate lacunar infarcts also are stable. Patchy and confluent cerebral white matter hypodensity also has not significantly changed. Scattered small lacunar infarcts in both cerebellar hemispheres also appear stable. No midline shift, mass effect, or evidence of intracranial mass lesion. No acute intracranial hemorrhage identified. No evidence of cortically based acute infarction identified. No suspicious intracranial vascular hyperdensity.  IMPRESSION: 1. Stable non contrast CT appearance of the brain. No new intracranial abnormality identified. 2. Chronic ischemic disease detailed above. Study discussed by telephone with Dr. Wallie Char on 11/14/2013 at 1718 hrs.   Electronically Signed   By: Lars Pinks M.D.   On: 11/14/2013 17:22   Mr Jodene Nam Head Wo Contrast  11/14/2013   CLINICAL DATA:  Stroke  EXAM: MRI HEAD WITHOUT AND WITH CONTRAST  MRA HEAD WITHOUT CONTRAST  MRA NECK WITHOUT AND WITH CONTRAST  TECHNIQUE: Multiplanar, multiecho pulse sequences of the brain and surrounding structures were obtained without and with intravenous contrast. Angiographic images of the Circle of Willis were obtained using MRA technique without intravenous contrast. Angiographic images of the neck were obtained using MRA technique without and with intravenous contrast. Carotid stenosis measurements (when applicable) are obtained utilizing NASCET criteria, using the distal internal carotid diameter as the denominator.  CONTRAST:  95mL MULTIHANCE GADOBENATE DIMEGLUMINE 529 MG/ML IV SOLN  COMPARISON:  CT head 11/14/2013  FINDINGS: MRI HEAD FINDINGS  Small area of acute infarct in the left frontal cortex above the sylvian fissure. No other acute infarct identified.  Generalized atrophy. Chronic ischemic changes throughout the white matter. 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 Mild chronic ischemia in  the cerebellum bilaterally. Chronic infarct in the left thalamus. Scattered areas of chronic micro hemorrhage bilaterally most likely due to chronic hypertension  Postcontrast imaging reveals no enhancing mass lesion.  MRA HEAD FINDINGS  Both vertebral arteries are patent to the basilar. Right AICA is patent with proximal stenosis. Left PICA patent. Basilar patent. Superior cerebellar arteries show mild disease proximally. Posterior cerebral arteries are patent bilaterally with mild atherosclerotic disease. No significant stenosis  Internal carotid artery widely patent bilaterally. Anterior and middle cerebral arteries are patent bilaterally. Moderate disease in right middle cerebral artery branches.  Negative for cerebral aneurysm.  MRA NECK FINDINGS  Suboptimal image quality.  Suboptimal timing of the injection.  The internal carotid artery appears patent without significant stenosis bilaterally. Both vertebral arteries are patent without significant stenosis.  IMPRESSION: Subcentimeter focus of acute infarct in the left frontal cortex.  Mild intracranial atherosclerotic disease most prominent in the right middle cerebral artery.  Suboptimal MRA of the neck without significant carotid or vertebral stenosis in the neck.   Electronically Signed   By: Franchot Gallo M.D.   On: 11/14/2013 20:26   Mr Angiogram Neck W Wo Contrast  11/14/2013   CLINICAL DATA:  Stroke  EXAM: MRI HEAD WITHOUT AND WITH CONTRAST  MRA HEAD WITHOUT CONTRAST  MRA NECK WITHOUT AND WITH CONTRAST  TECHNIQUE: Multiplanar, multiecho pulse sequences of the brain and surrounding structures were obtained without and with intravenous contrast. Angiographic images of the Circle of Willis were obtained using MRA technique without  intravenous contrast. Angiographic images of the neck were obtained using MRA technique without and with intravenous contrast. Carotid stenosis measurements (when applicable) are obtained utilizing NASCET criteria, using the  distal internal carotid diameter as the denominator.  CONTRAST:  43mL MULTIHANCE GADOBENATE DIMEGLUMINE 529 MG/ML IV SOLN  COMPARISON:  CT head 11/14/2013  FINDINGS: MRI HEAD FINDINGS  Small area of acute infarct in the left frontal cortex above the sylvian fissure. No other acute infarct identified.  Generalized atrophy. Chronic ischemic changes throughout the white matter. 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 Mild chronic ischemia in the cerebellum bilaterally. Chronic infarct in the left thalamus. Scattered areas of chronic micro hemorrhage bilaterally most likely due to chronic hypertension  Postcontrast imaging reveals no enhancing mass lesion.  MRA HEAD FINDINGS  Both vertebral arteries are patent to the basilar. Right AICA is patent with proximal stenosis. Left PICA patent. Basilar patent. Superior cerebellar arteries show mild disease proximally. Posterior cerebral arteries are patent bilaterally with mild atherosclerotic disease. No significant stenosis  Internal carotid artery widely patent bilaterally. Anterior and middle cerebral arteries are patent bilaterally. Moderate disease in right middle cerebral artery branches.  Negative for cerebral aneurysm.  MRA NECK FINDINGS  Suboptimal image quality.  Suboptimal timing of the injection.  The internal carotid artery appears patent without significant stenosis bilaterally. Both vertebral arteries are patent without significant stenosis.  IMPRESSION: Subcentimeter focus of acute infarct in the left frontal cortex.  Mild intracranial atherosclerotic disease most prominent in the right middle cerebral artery.  Suboptimal MRA of the neck without significant carotid or vertebral stenosis in the neck.   Electronically Signed   By: Franchot Gallo M.D.   On: 11/14/2013 20:26   Mr Jeri Cos DD Contrast  11/14/2013   CLINICAL DATA:  Stroke  EXAM: MRI HEAD WITHOUT AND WITH CONTRAST  MRA HEAD WITHOUT CONTRAST  MRA NECK WITHOUT AND WITH CONTRAST  TECHNIQUE:  Multiplanar, multiecho pulse sequences of the brain and surrounding structures were obtained without and with intravenous contrast. Angiographic images of the Circle of Willis were obtained using MRA technique without intravenous contrast. Angiographic images of the neck were obtained using MRA technique without and with intravenous contrast. Carotid stenosis measurements (when applicable) are obtained utilizing NASCET criteria, using the distal internal carotid diameter as the denominator.  CONTRAST:  70mL MULTIHANCE GADOBENATE DIMEGLUMINE 529 MG/ML IV SOLN  COMPARISON:  CT head 11/14/2013  FINDINGS: MRI HEAD FINDINGS  Small area of acute infarct in the left frontal cortex above the sylvian fissure. No other acute infarct identified.  Generalized atrophy. Chronic ischemic changes throughout the white matter. 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 Mild chronic ischemia in the cerebellum bilaterally. Chronic infarct in the left thalamus. Scattered areas of chronic micro hemorrhage bilaterally most likely due to chronic hypertension  Postcontrast imaging reveals no enhancing mass lesion.  MRA HEAD FINDINGS  Both vertebral arteries are patent to the basilar. Right AICA is patent with proximal stenosis. Left PICA patent. Basilar patent. Superior cerebellar arteries show mild disease proximally. Posterior cerebral arteries are patent bilaterally with mild atherosclerotic disease. No significant stenosis  Internal carotid artery widely patent bilaterally. Anterior and middle cerebral arteries are patent bilaterally. Moderate disease in right middle cerebral artery branches.  Negative for cerebral aneurysm.  MRA NECK FINDINGS  Suboptimal image quality.  Suboptimal timing of the injection.  The internal carotid artery appears patent without significant stenosis bilaterally. Both vertebral arteries are patent without significant stenosis.  IMPRESSION: Subcentimeter focus of  acute infarct in the left frontal cortex.   Mild intracranial atherosclerotic disease most prominent in the right middle cerebral artery.  Suboptimal MRA of the neck without significant carotid or vertebral stenosis in the neck.   Electronically Signed   By: Franchot Gallo M.D.   On: 11/14/2013 20:26    Assessment/Plan: 1) C diff - may be early active infection or colonization.  Treat for 14 days with oral vancomycin 125 mg qid.    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    3) fever - cultures negative so far.  I will d/c vancomycin IV and zosyn.    [name], [facility] for Infectious Disease [place] [email or web address] [phone] pager   [phone] cell 11/16/2013, 12:29 PM

## 2013-11-16 NOTE — Progress Notes (Signed)
Clinical Social Work Department BRIEF PSYCHOSOCIAL ASSESSMENT 11/16/2013  Patient:  [name],[name]     Account Number:  [id number]     [place] date:  11/14/2013  Clinical Social Worker:  Adair Laundry  Date/Time:  11/16/2013 02:00 PM  Referred by:  Physician  Date Referred:  11/16/2013 Referred for  SNF Placement   Other Referral:   Interview type:  Family Other interview type:   Spoke with pt daughter outside of room    PSYCHOSOCIAL DATA Living Status:  WITH ADULT CHILDREN Admitted from facility:   Level of care:   Primary support name:  [name] Primary support relationship to patient:  CHILD, ADULT Degree of support available:   Pt has strong support from family    CURRENT CONCERNS Current Concerns  Post-Acute Placement   Other Concerns:    SOCIAL WORK ASSESSMENT / PLAN CSW aware of PT recommendation for SNF. CSW visited pt room and spoke with pt daughter outside of pt room. Pt not able to participate at time of assessment. CSW informed pt daughter of recommendation. She informed CSW that pt lives with her other daughter and is not agreeable to SNF. Pt family member had negative experience with SNF in the past and therefore pt is not agreeable to this. Pt daughter did say they would be open to home health services. CSW explained that home health services will not provide 24 hr supervision. Pt daughter understanding of this and asked to be provided with private care service list so family could explore if they could financially afford help. Pt daughter did say that if PCS was too expensive family would work out pt care and manage on their own. CSW notified RNCM of refusal for SNF and open to [facility] services and requesting PCS list. At this time, pt has no further hospital social work needs. CSW signing off.   Assessment/plan status:  No Further Intervention Required Other assessment/ plan:   Information/referral to community resources:   SNF list denied     PATIENT'S/FAMILY'S RESPONSE TO PLAN OF CARE: Pt daughter pleasant and cooperative. Pt and pt family wanting to dc home with [facility] services.       [name], [name]

## 2013-11-16 NOTE — Progress Notes (Signed)
When assessing patient at 2000, Notified family that I had to order patient's medication from pharmacy and also of just receiving report on patient. Family had concerns of patient not receiving PICC or central line until tomorrow am. Notified family that patient was treated with oral antibiotics for the night and that she would receive the IV abx tomorrow until placement is received. Explained to patient that physician may have considered the oral abx suffice and that unless if it was a life threatening emergency that the required the patient to have a central line placement or PICC that it would be morning until patient could receive IV abx. Explained that oral vanc is usually given more for C. Diff infections and Vanc is more of a broad spectrum abx, explained the difference between the doses. Patient expressed concerns over having endocarditis that needed to be treated right away. Charge nurse expressed to family that the physician and pharmacist thought the amount of medication would be suffice overnight for the night for the patient. Patient's family started to incite that the floor was not looking after her as they should because she was a cardiac patient and that she needed to more monitored closely.  They stated that when they came in the room this am, they saw that her breakfast tray was still on the wall to where she could not reach it. They also stated that their mother's lips were turning blue and that she was gasping for air and that the Sp02 monitor was showing the patient's sats were at 100 and that they were told that it was fine and that the NT placed a non-rebreather mask on the patient at 3L. They complained that the NTs kept forgetting to collect stool specimen for C. Diff testing. They complained that they were never notified that the patient had a positive C. Diff screen until this writer expressed so. One of the daughters were screaming at this writer to get the patient off the floor now and  the patient had almost died three times at this hospital before at previous admissions. Environmental health practitioner of Magazine features editor PA. Schorr PA stated over the phone that she would not transfer patient to another floor because she felt that the patient was stable and fine to be on floor. Explained to her that the family had concerns of her care being on this floor because they felt she needed her vitals taken more often and that the we needed to monitor her more frequently. Schorr PA expressed to this Probation officer to notify [facility] on call. Non-rebreather mask was removed to a Venturi mask at set 24% on 3L. Rapid Response nurse came to assess patient and stated that patient looked stable and fine and that she did not needed to move to another floor. AC spoke with family and had me to return a page to [place] over 2109 BP of 150/110 and that the patient's family wanted to move patient out to another floor. Notified Schorr PA of BP and obtained manual BP which showed 140/70 at 2121. Sent text to notify Schorr PA. Called report to nurse on 3W to transfer out. Notified Charge Nurse and [facility] stated that he would come back later to see myself to follow with what happened.

## 2013-11-16 NOTE — Progress Notes (Signed)
[facility]  Patient Status: Active (receiving services up to time of hospitalization)  [facility] is providing the following services: RN, PT and OT  If patient discharges after hours, please call [phone].   Tomi Bamberger 11/16/2013, 11:16 AM  Pt admitted to hospital before being seen at home.

## 2013-11-16 NOTE — Progress Notes (Signed)
  Echocardiogram 2D Echocardiogram has been performed.  Darlina Sicilian M 11/16/2013, 2:54 PM

## 2013-11-16 NOTE — Progress Notes (Addendum)
[facility] PROGRESS NOTE  [name] FVC:[id number] DOB: [birth year] DOA: 11/14/2013 PCP: Hollace Kinnier, DO  Assessment/Plan: 73 y.o. female PMH of CAD s/p CABG, s/p AVR, h/o CVA, PAF on AC (coumadin), CHF, Pulmonary HTN, Pulmonary Fibrosis, chronic respiratory failure on home oxygen,  Lupus, h/o recurrent endocarditis, on amoxicillin for endocarditis prophylaxis, discharged 8/21 was brought by her daughter after she was found on the floor near the bed with aphasia -Found to have CVA, hypoxia, fever found to have C diff   1. Acute CVA; MRI + Subcentimeter focus of acute infarct in the left frontal cortex -Patient developed CVA while on coumadin with INR 2.6; ? 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 Terrence Dupont patient is likely to have vegetations but unfortunately not a candidate for surgery    2. Acute on chronic CHF with pulmonary HTN; echo (08/2013): LVEF 55%, AR, MR, TR, PA 81 mmHg -CXR: pulmonary edema, clinically volume overloaded;  -Increase lasix, cont I/O, daily weight; cont BB, may need ACE 3. H/o recurrent endocarditis, Pt was on amoxicillin for endocarditis prophylaxis -Leukocytosis; started on IV atx; pend blood cultures; previous cultures neg;  -d/w cardiology regarding vegetation; Per Dr. Terrence Dupont patient is likely to have vegetations but unfortunately not a candidate for surgery -pend echo if no veg  ?Hold PO atx with recurrent c diff; defer to ID, appreciate assistance  4. C diff, recurrent; started POP vanc; monitor  5. CAD s/p CABG, Pt denies acute chest pain; cont home regimen  6. PAF on AC (coumadin), cont amiodarone, BB, coumadin  7. Pulmonary Fibrosis, chronic respiratory failure on home oxygen; cont bronchodilators prn, oxygen   Prognosis seems poor due to recurrent infective  endocarditis, acute on chronic CVA on anticoagulation, acute on chronic CHF, with severe pulmonary HTN; blood cultures NGTD; no IV access, IV team could not place; Patient needs IV access to manage acute CHF,. And possible IV atx, for medications; PICC line today;    -D/w patient, updated her daughter; all questions answered     Code Status: DNR Family Communication: d/w patient, updated  [name] Daughter [phone]   (indicate person spoken with, relationship, and if by phone, the number) Disposition Plan: pend  Clinical imprvement   MRA NECK FINDINGS  Suboptimal image quality. Suboptimal timing of the injection.  The internal carotid artery appears patent without significant  stenosis bilaterally. Both vertebral arteries are patent without  significant stenosis.  IMPRESSION:  Subcentimeter focus of acute infarct in the left frontal cortex.  Mild intracranial atherosclerotic disease most prominent in the  right middle cerebral artery.  Suboptimal MRA of the neck without significant carotid or vertebral  stenosis in the neck.   Consultants:  neuroogy  Procedures:  none  Antibiotics:  vanc 8/22<<<<   Zosyn 8/22<<<<  (indicate start date, and stop date if known)  HPI/Subjective: alert  Objective: Filed Vitals:   11/16/13 0400  BP: 156/85  Pulse: 105  Temp: 97.7 F (36.5 C)  Resp: 18    Intake/Output Summary (Last 24 hours) at 11/16/13 0909 Last data filed at 11/16/13 0600  Gross per 24 hour  Intake    100 ml  Output      0 ml  Net    100 ml   Filed Weights   11/14/13 0917 11/14/13 1400 11/14/13 2214  Weight: 78.472 kg (173 lb) 78.3 kg (  172 lb 9.9 oz) 78.427 kg (172 lb 14.4 oz)    Exam:   General:  alert  Cardiovascular: s1,s2 sys mr   Respiratory: BL crackle in LL  Abdomen: soft, nt,nd   Musculoskeletal: no mild edema   Data Reviewed: Basic Metabolic Panel:  Recent Labs Lab 11/11/13 0304 11/12/13 0500 11/14/13 0923  11/14/13 1301 11/15/13 0340  NA 138 139 139 141 140  K 3.9 4.5 5.7* 4.8 3.8  CL 107 109 106 108 108  CO2 17* 16* 15* 15* 18*  GLUCOSE 145* 130* 76 74 89  BUN 29* 34* 40* 42* 42*  CREATININE 1.09 1.03 1.08 1.15* 1.19*  CALCIUM 9.3 9.4 9.3 9.1 8.4   Liver Function Tests:  Recent Labs Lab 11/09/13 0947 11/10/13 0234 11/11/13 0304 11/12/13 0500 11/14/13 0923  AST 562* 301* 142* 83* 54*  ALT 1006* 812* 603* 473* 308*  ALKPHOS 119* 133* 146* 150* 144*  BILITOT 2.1* 1.4* 1.2 1.1 2.3*  PROT 5.8* 5.8* 6.1 6.2 7.1  ALBUMIN 2.5* 2.4* 2.6* 2.7* 3.3*   No results found for this basename: LIPASE, AMYLASE,  in the last 168 hours No results found for this basename: AMMONIA,  in the last 168 hours CBC:  Recent Labs Lab 11/10/13 0234 11/11/13 0304 11/12/13 0500 11/14/13 0923 11/15/13 0340  WBC 4.2 5.7 6.7 14.9* 6.8  NEUTROABS  --   --   --  11.7*  --   HGB 9.6* 10.6* 11.2* 12.5 10.3*  HCT 28.8* 31.2* 33.5* 39.3 30.4*  MCV 95.0 95.7 96.0 97.0 97.4  PLT 49* 62* 81* 145* 102*   Cardiac Enzymes:  Recent Labs Lab 11/14/13 0923  TROPONINI <0.30   BNP (last 3 results)  Recent Labs  09/12/13 1122 11/07/13 1000 11/14/13 0923  PROBNP 16828.0* 24587.0* 13807.0*   CBG: No results found for this basename: GLUCAP,  in the last 168 hours  Recent Results (from the past 240 hour(s))  URINE CULTURE     Status: None   Collection Time    11/07/13  7:56 AM      Result Value Ref Range Status   Specimen Description URINE, CATHETERIZED   Final   Special Requests NONE   Final   Culture  Setup Time     Final   Value: 11/07/2013 18:34     Performed at [facility]     Final   Value: NO GROWTH     Performed at [facility]   Culture     Final   Value: NO GROWTH     Performed at [facility]   Report Status 11/08/2013 FINAL   Final  CULTURE, BLOOD (ROUTINE X 2)     Status: None   Collection Time    11/07/13  8:20 AM      Result Value Ref  Range Status   Specimen Description BLOOD LEFT ARM   Final   Special Requests BOTTLES DRAWN AEROBIC ONLY 4CC   Final   Culture  Setup Time     Final   Value: 11/07/2013 18:00     Performed at [facility]   Culture     Final   Value: NO GROWTH 5 DAYS     Performed at [facility]   Report Status 11/13/2013 FINAL   Final  CULTURE, BLOOD (ROUTINE X 2)     Status: None   Collection Time    11/07/13  8:27 AM  Result Value Ref Range Status   Specimen Description BLOOD LEFT HAND   Final   Special Requests BOTTLES DRAWN AEROBIC ONLY 3CC   Final   Culture  Setup Time     Final   Value: 11/07/2013 18:00     Performed at [facility]   Culture     Final   Value: NO GROWTH 5 DAYS     Performed at [facility]   Report Status 11/13/2013 FINAL   Final  MRSA PCR SCREENING     Status: None   Collection Time    11/07/13  2:09 PM      Result Value Ref Range Status   MRSA by PCR NEGATIVE  NEGATIVE Final   Comment:            The GeneXpert MRSA Assay (FDA     approved for NASAL specimens     only), is one component of a     comprehensive MRSA colonization     surveillance program. It is not     intended to diagnose MRSA     infection nor to guide or     monitor treatment for     MRSA infections.  CULTURE, BLOOD (ROUTINE X 2)     Status: None   Collection Time    11/14/13  9:48 AM      Result Value Ref Range Status   Specimen Description BLOOD LEFT HAND   Final   Special Requests BOTTLES DRAWN AEROBIC ONLY 5ML   Final   Culture  Setup Time     Final   Value: 11/14/2013 20:00     Performed at [facility]   Culture     Final   Value:        BLOOD CULTURE RECEIVED NO GROWTH TO DATE CULTURE WILL BE HELD FOR 5 DAYS BEFORE ISSUING A FINAL NEGATIVE REPORT     Performed at [facility]   Report Status PENDING   Incomplete  CULTURE, BLOOD (ROUTINE X 2)     Status: None   Collection Time    11/14/13 11:45 AM      Result Value Ref Range  Status   Specimen Description BLOOD RIGHT HAND   Final   Special Requests BOTTLES DRAWN AEROBIC AND ANAEROBIC B 5CC R 4CC   Final   Culture  Setup Time     Final   Value: 11/15/2013 00:09     Performed at [facility]   Culture     Final   Value:        BLOOD CULTURE RECEIVED NO GROWTH TO DATE CULTURE WILL BE HELD FOR 5 DAYS BEFORE ISSUING A FINAL NEGATIVE REPORT     Performed at [facility]   Report Status PENDING   Incomplete  CLOSTRIDIUM DIFFICILE BY PCR     Status: Abnormal   Collection Time    11/15/13  2:34 PM      Result Value Ref Range Status   C difficile by pcr POSITIVE (*) NEGATIVE Final   Comment: CRITICAL RESULT CALLED TO, READ BACK BY AND VERIFIED WITH:     LATOYA SILVA [id number] BY [place] 11/15/13     Studies: Dg Chest 2 View  11/15/2013   CLINICAL DATA:  Suspect pneumonia; history of CHF  EXAM: CHEST  2 VIEW  COMPARISON:  .  Portable chest x-ray of November 14, 2013  FINDINGS: The right lung is less well inflated today. The interstitial markings are  increased bilaterally. The cardiopericardial silhouette is enlarged. The pulmonary vascularity is indistinct. There is dense calcification in the mitral valvular annulus. A trace of pleural fluid blunts the costophrenic angles bilaterally. The patient has undergone previous median sternotomy. The bony thorax is unremarkable on the frontal film. On the lateral film the thoracic spine cannot be adequately assessed.  IMPRESSION: Mildly increased interstitial markings bilaterally are slightly more conspicuous today. This may reflect worsening of CHF. No discrete alveolar pneumonia is demonstrated.   Electronically Signed   By: David  Martinique   On: 11/15/2013 13:54   Ct Head Wo Contrast  11/14/2013   CLINICAL DATA:  73 year old female code stroke. Unresponsive with mental status change. Initial encounter.  EXAM: CT HEAD WITHOUT CONTRAST  TECHNIQUE: Contiguous axial images were obtained from the base of the skull through the  vertex without intravenous contrast.  COMPARISON:  Head and cervical spine CT 1039 hr the same day and earlier.  FINDINGS: Stable paranasal sinuses and mastoids. No acute osseous abnormality identified. Stable orbit and scalp soft tissues.  Calcified atherosclerosis at the skull base. 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 Left thalamic and right caudate lacunar infarcts also are stable. Patchy and confluent cerebral white matter hypodensity also has not significantly changed. Scattered small lacunar infarcts in both cerebellar hemispheres also appear stable. No midline shift, mass effect, or evidence of intracranial mass lesion. No acute intracranial hemorrhage identified. No evidence of cortically based acute infarction identified. No suspicious intracranial vascular hyperdensity.  IMPRESSION: 1. Stable non contrast CT appearance of the brain. No new intracranial abnormality identified. 2. Chronic ischemic disease detailed above. Study discussed by telephone with Dr. Wallie Char on 11/14/2013 at 1718 hrs.   Electronically Signed   By: Lars Pinks M.D.   On: 11/14/2013 17:22   Ct Head Wo Contrast  11/14/2013   CLINICAL DATA:  Fall earlier today. Now altered mental status and lethargy. Patient on Coumadin.  EXAM: CT HEAD WITHOUT CONTRAST  CT CERVICAL SPINE WITHOUT CONTRAST  TECHNIQUE: Multidetector CT imaging of the head and cervical spine was performed following the standard protocol without intravenous contrast. Multiplanar CT image reconstructions of the cervical spine were also generated.  COMPARISON:  Prior CT scan of the head and cervical spine 06/27/2013  FINDINGS: CT HEAD FINDINGS  Negative for acute intracranial hemorrhage, acute infarction, mass, mass effect, hydrocephalus or midline shift. Gray-white differentiation is preserved throughout. A stable appearance of atrophy, prior right parietal infarct, prior right occipital infarct and extensive  sequelae of longstanding chronic microvascular ischemic white matter disease. Stable ventricular configuration. No acute scalp hematoma or calvarial fracture. Normal aeration of the mastoid air cells. Mild mucoperiosteal thickening in the inferior aspect of the maxillary sinuses. Atherosclerosis in the bilateral cavernous carotid arteries.  CT CERVICAL SPINE FINDINGS  No acute fracture, malalignment or prevertebral soft tissue swelling. Multilevel degenerative disc disease. Disc space narrowing most prominent at C5-C6. Multilevel facet arthropathy on the right at C3-C4 and C4-C5. The is Unremarkable CT appearance of the thyroid gland. No acute soft tissue abnormality. The lung apices are unremarkable.  IMPRESSION: CT HEAD  1. No acute intracranial abnormality. 2. Stable atrophy, remote infarcts involving the right parietal and right occipital regions and advanced chronic microvascular ischemic white matter disease. CT CSPINE  1. No acute fracture or malalignment. 2. Multilevel cervical spondylosis and right-sided facet arthropathy.   Electronically Signed   By: Jacqulynn Cadet M.D.   On: 11/14/2013 11:34  Ct Cervical Spine Wo Contrast  11/14/2013   CLINICAL DATA:  Fall earlier today. Now altered mental status and lethargy. Patient on Coumadin.  EXAM: CT HEAD WITHOUT CONTRAST  CT CERVICAL SPINE WITHOUT CONTRAST  TECHNIQUE: Multidetector CT imaging of the head and cervical spine was performed following the standard protocol without intravenous contrast. Multiplanar CT image reconstructions of the cervical spine were also generated.  COMPARISON:  Prior CT scan of the head and cervical spine 06/27/2013  FINDINGS: CT HEAD FINDINGS  Negative for acute intracranial hemorrhage, acute infarction, mass, mass effect, hydrocephalus or midline shift. Gray-white differentiation is preserved throughout. A stable appearance of atrophy, prior right parietal infarct, prior right occipital infarct and extensive sequelae of  longstanding chronic microvascular ischemic white matter disease. Stable ventricular configuration. No acute scalp hematoma or calvarial fracture. Normal aeration of the mastoid air cells. Mild mucoperiosteal thickening in the inferior aspect of the maxillary sinuses. Atherosclerosis in the bilateral cavernous carotid arteries.  CT CERVICAL SPINE FINDINGS  No acute fracture, malalignment or prevertebral soft tissue swelling. Multilevel degenerative disc disease. Disc space narrowing most prominent at C5-C6. Multilevel facet arthropathy on the right at C3-C4 and C4-C5. The is Unremarkable CT appearance of the thyroid gland. No acute soft tissue abnormality. The lung apices are unremarkable.  IMPRESSION: CT HEAD  1. No acute intracranial abnormality. 2. Stable atrophy, remote infarcts involving the right parietal and right occipital regions and advanced chronic microvascular ischemic white matter disease. CT CSPINE  1. No acute fracture or malalignment. 2. Multilevel cervical spondylosis and right-sided facet arthropathy.   Electronically Signed   By: Jacqulynn Cadet M.D.   On: 11/14/2013 11:34   Mr Jodene Nam Head Wo Contrast  11/14/2013   CLINICAL DATA:  Stroke  EXAM: MRI HEAD WITHOUT AND WITH CONTRAST  MRA HEAD WITHOUT CONTRAST  MRA NECK WITHOUT AND WITH CONTRAST  TECHNIQUE: Multiplanar, multiecho pulse sequences of the brain and surrounding structures were obtained without and with intravenous contrast. Angiographic images of the Circle of Willis were obtained using MRA technique without intravenous contrast. Angiographic images of the neck were obtained using MRA technique without and with intravenous contrast. Carotid stenosis measurements (when applicable) are obtained utilizing NASCET criteria, using the distal internal carotid diameter as the denominator.  CONTRAST:  55mL MULTIHANCE GADOBENATE DIMEGLUMINE 529 MG/ML IV SOLN  COMPARISON:  CT head 11/14/2013  FINDINGS: MRI HEAD FINDINGS  Small area of acute  infarct in the left frontal cortex above the sylvian fissure. No other acute infarct identified.  Generalized atrophy. Chronic ischemic changes throughout the white matter. 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 Mild chronic ischemia in the cerebellum bilaterally. Chronic infarct in the left thalamus. Scattered areas of chronic micro hemorrhage bilaterally most likely due to chronic hypertension  Postcontrast imaging reveals no enhancing mass lesion.  MRA HEAD FINDINGS  Both vertebral arteries are patent to the basilar. Right AICA is patent with proximal stenosis. Left PICA patent. Basilar patent. Superior cerebellar arteries show mild disease proximally. Posterior cerebral arteries are patent bilaterally with mild atherosclerotic disease. No significant stenosis  Internal carotid artery widely patent bilaterally. Anterior and middle cerebral arteries are patent bilaterally. Moderate disease in right middle cerebral artery branches.  Negative for cerebral aneurysm.  MRA NECK FINDINGS  Suboptimal image quality.  Suboptimal timing of the injection.  The internal carotid artery appears patent without significant stenosis bilaterally. Both vertebral arteries are patent without significant stenosis.  IMPRESSION: Subcentimeter focus of acute infarct in the left frontal cortex.  Mild intracranial atherosclerotic disease most prominent in the right middle cerebral artery.  Suboptimal MRA of the neck without significant carotid or vertebral stenosis in the neck.   Electronically Signed   By: Franchot Gallo M.D.   On: 11/14/2013 20:26   Mr Angiogram Neck W Wo Contrast  11/14/2013   CLINICAL DATA:  Stroke  EXAM: MRI HEAD WITHOUT AND WITH CONTRAST  MRA HEAD WITHOUT CONTRAST  MRA NECK WITHOUT AND WITH CONTRAST  TECHNIQUE: Multiplanar, multiecho pulse sequences of the brain and surrounding structures were obtained without and with intravenous contrast. Angiographic images of the Circle of Willis were obtained using MRA  technique without intravenous contrast. Angiographic images of the neck were obtained using MRA technique without and with intravenous contrast. Carotid stenosis measurements (when applicable) are obtained utilizing NASCET criteria, using the distal internal carotid diameter as the denominator.  CONTRAST:  65mL MULTIHANCE GADOBENATE DIMEGLUMINE 529 MG/ML IV SOLN  COMPARISON:  CT head 11/14/2013  FINDINGS: MRI HEAD FINDINGS  Small area of acute infarct in the left frontal cortex above the sylvian fissure. No other acute infarct identified.  Generalized atrophy. Chronic ischemic changes throughout the white matter. 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 Mild chronic ischemia in the cerebellum bilaterally. Chronic infarct in the left thalamus. Scattered areas of chronic micro hemorrhage bilaterally most likely due to chronic hypertension  Postcontrast imaging reveals no enhancing mass lesion.  MRA HEAD FINDINGS  Both vertebral arteries are patent to the basilar. Right AICA is patent with proximal stenosis. Left PICA patent. Basilar patent. Superior cerebellar arteries show mild disease proximally. Posterior cerebral arteries are patent bilaterally with mild atherosclerotic disease. No significant stenosis  Internal carotid artery widely patent bilaterally. Anterior and middle cerebral arteries are patent bilaterally. Moderate disease in right middle cerebral artery branches.  Negative for cerebral aneurysm.  MRA NECK FINDINGS  Suboptimal image quality.  Suboptimal timing of the injection.  The internal carotid artery appears patent without significant stenosis bilaterally. Both vertebral arteries are patent without significant stenosis.  IMPRESSION: Subcentimeter focus of acute infarct in the left frontal cortex.  Mild intracranial atherosclerotic disease most prominent in the right middle cerebral artery.  Suboptimal MRA of the neck without significant carotid or vertebral stenosis in the neck.   Electronically  Signed   By: Franchot Gallo M.D.   On: 11/14/2013 20:26   Mr Jeri Cos NI Contrast  11/14/2013   CLINICAL DATA:  Stroke  EXAM: MRI HEAD WITHOUT AND WITH CONTRAST  MRA HEAD WITHOUT CONTRAST  MRA NECK WITHOUT AND WITH CONTRAST  TECHNIQUE: Multiplanar, multiecho pulse sequences of the brain and surrounding structures were obtained without and with intravenous contrast. Angiographic images of the Circle of Willis were obtained using MRA technique without intravenous contrast. Angiographic images of the neck were obtained using MRA technique without and with intravenous contrast. Carotid stenosis measurements (when applicable) are obtained utilizing NASCET criteria, using the distal internal carotid diameter as the denominator.  CONTRAST:  73mL MULTIHANCE GADOBENATE DIMEGLUMINE 529 MG/ML IV SOLN  COMPARISON:  CT head 11/14/2013  FINDINGS: MRI HEAD FINDINGS  Small area of acute infarct in the left frontal cortex above the sylvian fissure. No other acute infarct identified.  Generalized atrophy. Chronic ischemic changes throughout the white matter. 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 Mild chronic ischemia in the cerebellum bilaterally. Chronic infarct in the left thalamus. Scattered areas of chronic micro hemorrhage bilaterally most likely due to chronic hypertension  Postcontrast imaging reveals no enhancing mass lesion.  MRA HEAD FINDINGS  Both vertebral arteries are patent to the basilar. Right AICA is patent with proximal stenosis. Left PICA patent. Basilar patent. Superior cerebellar arteries show mild disease proximally. Posterior cerebral arteries are patent bilaterally with mild atherosclerotic disease. No significant stenosis  Internal carotid artery widely patent bilaterally. Anterior and middle cerebral arteries are patent bilaterally. Moderate disease in right middle cerebral artery branches.  Negative for cerebral aneurysm.  MRA NECK FINDINGS  Suboptimal image quality.  Suboptimal timing of the  injection.  The internal carotid artery appears patent without significant stenosis bilaterally. Both vertebral arteries are patent without significant stenosis.  IMPRESSION: Subcentimeter focus of acute infarct in the left frontal cortex.  Mild intracranial atherosclerotic disease most prominent in the right middle cerebral artery.  Suboptimal MRA of the neck without significant carotid or vertebral stenosis in the neck.   Electronically Signed   By: Franchot Gallo M.D.   On: 11/14/2013 20:26   Dg Chest Portable 1 View  11/14/2013   CLINICAL DATA:  Altered mental status  EXAM: PORTABLE CHEST - 1 VIEW  COMPARISON:  Prior chest x-ray 11/07/2013  FINDINGS: Stable cardiomegaly. Patient is status post median sternotomy with evidence of prior CABG. Patient is status post median sternotomy with evidence of prior CABG. Atherosclerotic calcifications present within the transverse aorta. Is slightly lower inspiratory volumes.a increased pulmonary vascular congestion now with mild interstitial edema. Kerley B-lines are noted in the periphery of the right lung. No focal airspace consolidation, pleural effusion or pneumothorax. No acute osseous abnormality.  IMPRESSION: 1. Interval development of mild interstitial edema concerning for mild CHF.   Electronically Signed   By: Jacqulynn Cadet M.D.   On: 11/14/2013 10:20    Scheduled Meds: . amiodarone  200 mg Oral Daily  . atorvastatin  20 mg Oral q1800  . famotidine  20 mg Oral BID  . furosemide  20 mg Oral BID  . gabapentin  300 mg Oral QHS  . hydroxychloroquine  200 mg Oral BID  . metoprolol tartrate  25 mg Oral Daily  . mirtazapine  7.5 mg Oral QHS  . piperacillin-tazobactam (ZOSYN)  IV  3.375 g Intravenous Q8H  . saccharomyces boulardii  250 mg Oral BID  . vancomycin  250 mg Oral 4 times per day  . vancomycin  750 mg Intravenous Q12H  . Warfarin - Pharmacist Dosing Inpatient   Does not apply q1800   Continuous Infusions:   Active Problems:   CORONARY  ARTERY DISEASE   Atrial fibrillation   CORONARY ARTERY BYPASS GRAFT, HX OF   Prosthetic valve endocarditis   Protein-calorie malnutrition, severe   Lupus   CKD (chronic kidney disease) stage 3, GFR 30-59 ml/min   Fever   CVA (cerebral vascular accident)   Aphasia   Diarrhea    Time spent: >35 minutes     Kinnie Feil  [facility] Pager [phone]. If 7PM-7AM, please contact night-coverage at [email or web address], password [email or web address] 11/16/2013, 9:09 AM  LOS: 2 days

## 2013-11-16 NOTE — Clinical Documentation Improvement (Signed)
"  Sepsis and HCAP" documented in H&P.  Please clarify in the progress notes and discharge summary if the diagnosis of Sepsis and HCAP were:   - Ruled Out and not applicable to this admission   - Ruled In and applicable to this admission   - Unable to Clinically Determine  Thank You, Erling Conte ,RN Clinical Documentation Specialist:  [phone] [facility]

## 2013-11-16 NOTE — Progress Notes (Signed)
INITIAL NUTRITION ASSESSMENT  DOCUMENTATION CODES Per approved criteria  -Not Applicable   INTERVENTION: - Ensure Complete TID - Daughter will continue to encourage PO intake once pt wakes up - RD to continue to monitor   NUTRITION DIAGNOSIS: Inadequate oral intake related to poor appetite as evidenced by 25% meal intake.   Goal: Pt to consume >90% of meals/supplements  Monitor:  Weights, labs, intake  Reason for Assessment: Malnutrition screening tool   73 y.o. female  Admitting Dx: Fall  ASSESSMENT: Pt with prior h/o recurrent endocarditis, on amoxicillin for endocarditis prophylaxis, atrial fibrillation on coumadin, CAD, pulmonary fibrosis, h/o c diff colitis, anemia, chronic diastolic heart failure, discharged 1 day PTA, was brought by her daughter after she was found on the floor near the bed. Had rapid response called 8/22 and was found to have acute stroke. Pt also positive for C Difficile. Pt seen by inpatient RD during admission earlier this month and was eating well, 75-100% of meals.   - Pt asleep during visit, daughter present at bedside - Daughter reports at home pt was eating 2 meals/day of a good variety of foods and was drinking 1 Ensure/day - Weight up 22 pounds in the past month per weight trend, noted pt with +2 extremity edema - Daughter reports pt's appetite has been down over the weekend with meal intake 25% - Daughter denies pt having any diarrhea today   Height: Ht Readings from Last 1 Encounters:  11/14/13 5\' 6"  (1.676 m)    Weight: Wt Readings from Last 1 Encounters:  11/14/13 172 lb 14.4 oz (78.427 kg)    Ideal Body Weight: 130 lbs  % Ideal Body Weight: 132%  Wt Readings from Last 10 Encounters:  11/14/13 172 lb 14.4 oz (78.427 kg)  11/13/13 173 lb 1.6 oz (78.518 kg)  09/28/13 150 lb (68.04 kg)  09/15/13 134 lb 3.2 oz (60.873 kg)  08/31/13 140 lb (63.504 kg)  08/03/13 147 lb (66.679 kg)  07/27/13 144 lb (65.318 kg)  07/09/13 151 lb  12.8 oz (68.856 kg)  07/03/13 161 lb (73.029 kg)  06/01/13 144 lb (65.318 kg)    Usual Body Weight: 150 lbs last month  % Usual Body Weight: 115%  BMI:  Body mass index is 27.92 kg/(m^2).  Estimated Nutritional Needs: Kcal: 1550-1750 Protein: 60-75g Fluid: per MD  Skin: +2 RUE, RLE, LLE edema, +1 LUE edema  Diet Order: Cardiac  EDUCATION NEEDS: -No education needs identified at this time   Intake/Output Summary (Last 24 hours) at 11/16/13 1348 Last data filed at 11/16/13 1300  Gross per 24 hour  Intake    460 ml  Output      0 ml  Net    460 ml    Last BM: 8/23  Labs:   Recent Labs Lab 11/14/13 0923 11/14/13 1301 11/15/13 0340  NA 139 141 140  K 5.7* 4.8 3.8  CL 106 108 108  CO2 15* 15* 18*  BUN 40* 42* 42*  CREATININE 1.08 1.15* 1.19*  CALCIUM 9.3 9.1 8.4  GLUCOSE 76 74 89    CBG (last 3)  No results found for this basename: GLUCAP,  in the last 72 hours  Scheduled Meds: . amiodarone  200 mg Oral Daily  . atorvastatin  20 mg Oral q1800  . [START ON 11/17/2013] famotidine  20 mg Oral Daily  . furosemide  20 mg Oral BID  . gabapentin  300 mg Oral QHS  . hydroxychloroquine  200 mg Oral BID  .  metoprolol tartrate  25 mg Oral Daily  . mirtazapine  7.5 mg Oral QHS  . saccharomyces boulardii  250 mg Oral BID  . vancomycin  250 mg Oral 4 times per day  . warfarin  2 mg Oral ONCE-1800  . Warfarin - Pharmacist Dosing Inpatient   Does not apply q1800    Continuous Infusions:   Past Medical History  Diagnosis Date  . Coronary artery disease   . Atrial fibrillation     Now NSR  . GERD (gastroesophageal reflux disease)   . Hyperlipidemia   . 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 Anemia   . Angina   . Hypertension   . Blood transfusion     "related to bad case of bronchitis once; 1 yr ago given due to GIB" (12/17/2012)  . H/O hiatal hernia   . Lupus     "that's compromised her lungs somewhat" (12/17/2012)  . Insomnia   .  Endocarditis   . Prosthetic valve endocarditis   . Enterococcal bacteremia   . Heart murmur   . CHF (congestive heart failure)   . Pneumonia     "once or twice" (12/17/2012)  . Chronic bronchitis     "used to get it once/yr; hasn't had it since ~ 1986" (12/17/2012)  . Shortness of breath     "all the times sometimes" (12/17/2012)  . On home oxygen therapy     "2L prn; always at night" (12/17/2012)  . Lower GI bleed ~ 01/2012  . Arthritis     "hands" (12/17/2012)  . 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 Right bundle branch block 11/07/2013    Past Surgical History  Procedure Laterality Date  . Givens capsule study  04/09/2011    Procedure: GIVENS CAPSULE STUDY;  Surgeon: Beryle Beams, MD;  Location: [place];  Service: Endoscopy;  Laterality: N/A;  . Tee without cardioversion  02/06/2012    Procedure: TRANSESOPHAGEAL ECHOCARDIOGRAM (TEE);  Surgeon: Birdie Riddle, MD;  Location: [facility] ENDOSCOPY;  Service: Cardiovascular;  Laterality: N/A;  . Esophagogastroduodenoscopy  02/12/2012    Procedure: ESOPHAGOGASTRODUODENOSCOPY (EGD);  Surgeon: Beryle Beams, MD;  Location: [facility] ENDOSCOPY;  Service: Endoscopy;  Laterality: N/A;  . Tee without cardioversion N/A 07/15/2012    Procedure: TRANSESOPHAGEAL ECHOCARDIOGRAM (TEE);  Surgeon: Birdie Riddle, MD;  Location: [facility] ENDOSCOPY;  Service: Cardiovascular;  Laterality: N/A;  . Breast lumpectomy Left 1977    "benign" (12/17/2012)  . Tonsillectomy  1940's  . Coronary artery bypass graft  2006  . 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   . Cardiac catheterization      "probably 2-3" (12/17/2012)  . Coronary angioplasty with stent placement      "several put in over the years" (12/17/2012)  . Cataract extraction w/ intraocular lens  implant, bilateral Bilateral 11/2000  . Peripherally inserted central catheter insertion Right 12/15/2012    "upper arm" (12/17/2012    Carlis Stable MS, RD,  LDN [phone] Pager [phone] Weekend/After Hours Pager

## 2013-11-16 NOTE — Clinical Documentation Improvement (Signed)
"  Acute on Chronic CHF" and "Chronic Diastolic Heart Failure" documented in current medical record.  Treated with Iv Lasix this admission that has been transitioned to po Lasix.  Please document the TYPE of Acute on Chronic CHF in the progress notes and discharge summary.  Thank You, Erling Conte ,RN Clinical Documentation Specialist:  [phone] [facility]

## 2013-11-17 DIAGNOSIS — I5031 Acute diastolic (congestive) heart failure: Secondary | ICD-10-CM

## 2013-11-17 LAB — CBC
HCT: 38.4 % (ref 36.0–46.0)
HEMOGLOBIN: 12.6 g/dL (ref 12.0–15.0)
MCH: 32.6 pg (ref 26.0–34.0)
MCHC: 32.8 g/dL (ref 30.0–36.0)
MCV: 99.5 fL (ref 78.0–100.0)
Platelets: 108 10*3/uL — ABNORMAL LOW (ref 150–400)
RBC: 3.86 MIL/uL — AB (ref 3.87–5.11)
RDW: 18.1 % — ABNORMAL HIGH (ref 11.5–15.5)
WBC: 9.1 10*3/uL (ref 4.0–10.5)

## 2013-11-17 LAB — BASIC METABOLIC PANEL
ANION GAP: 13 (ref 5–15)
BUN: 19 mg/dL (ref 6–23)
CHLORIDE: 101 meq/L (ref 96–112)
CO2: 25 meq/L (ref 19–32)
Calcium: 8.4 mg/dL (ref 8.4–10.5)
Creatinine, Ser: 0.9 mg/dL (ref 0.50–1.10)
GFR calc non Af Amer: 61 mL/min — ABNORMAL LOW (ref >90)
GFR, EST AFRICAN AMERICAN: 71 mL/min — AB (ref >90)
Glucose, Bld: 107 mg/dL — ABNORMAL HIGH (ref 70–99)
POTASSIUM: 3.3 meq/L — AB (ref 3.7–5.3)
Sodium: 139 mEq/L (ref 137–147)

## 2013-11-17 LAB — PROTIME-INR
INR: 2.39 — AB (ref 0.00–1.49)
PROTHROMBIN TIME: 26.1 s — AB (ref 11.6–15.2)

## 2013-11-17 MED ORDER — WARFARIN SODIUM 4 MG PO TABS
4.0000 mg | ORAL_TABLET | Freq: Once | ORAL | Status: AC
  Administered 2013-11-17: 4 mg via ORAL
  Filled 2013-11-17: qty 1

## 2013-11-17 MED ORDER — FUROSEMIDE 10 MG/ML IJ SOLN
40.0000 mg | Freq: Two times a day (BID) | INTRAMUSCULAR | Status: DC
  Administered 2013-11-17 (×2): 40 mg via INTRAVENOUS
  Filled 2013-11-17 (×4): qty 4

## 2013-11-17 MED ORDER — AMOXICILLIN 500 MG PO CAPS
500.0000 mg | ORAL_CAPSULE | Freq: Two times a day (BID) | ORAL | Status: DC
  Administered 2013-11-17 – 2013-11-19 (×5): 500 mg via ORAL
  Filled 2013-11-17 (×6): qty 1

## 2013-11-17 MED ORDER — POTASSIUM CHLORIDE CRYS ER 20 MEQ PO TBCR
30.0000 meq | EXTENDED_RELEASE_TABLET | Freq: Two times a day (BID) | ORAL | Status: DC
  Administered 2013-11-17 – 2013-11-19 (×5): 30 meq via ORAL
  Filled 2013-11-17 (×6): qty 1

## 2013-11-17 NOTE — Progress Notes (Signed)
Patient was recently discharged from [facility] services on 11/02/2013.  Will assess patient/family desire to be readmitted.  Spoke with nephew while patient was been bathed this am.  Provided [facility] literature and contact information for family.  Informed RNCM of request for assistance with readmission.  Will continue to follow.  Of note, [facility] Care Management services does not replace or interfere with any services that are arranged by inpatient case management or social work.  For additional questions or referrals please contact Corliss Blacker BSN RN [facility] Liaison at [phone].

## 2013-11-17 NOTE — Progress Notes (Signed)
ANTICOAGULATION CONSULT NOTE - Follow Up Consult  Pharmacy Consult for warfarin Indication: atrial fibrillation  Allergies  Allergen Reactions  . Ceftriaxone     RASH BUT TOLERATED AMPICILLIN AND IMIPENEM WITHOUT PROBLEMS  . Codeine Nausea Only    Labs:  Recent Labs  11/14/13 1301 11/15/13 0340 11/16/13 0330 11/17/13 0435  HGB  --  10.3*  --  12.6  HCT  --  30.4*  --  38.4  PLT  --  102*  --  108*  LABPROT  --  31.1* 30.1* 26.1*  INR  --  3.00* 2.87* 2.39*  CREATININE 1.15* 1.19*  --  0.90    Estimated Creatinine Clearance: 57 ml/min (by C-G formula based on Cr of 0.9).  Assessment: 39 yof admitted after a fall at home, who is Cdiff positive (vanc po) and now off broad spectrum antibiotics. She continues on chronic coumadin.  INR = 2.39   Goal of Therapy:  INR 2-3   Plan:  1. Warfarin 4 mg PO x 1 tonight 2. F/u AM INR  Thank you. Anette Guarneri, PharmD [phone]  11/17/2013,10:52 AM

## 2013-11-17 NOTE — Progress Notes (Addendum)
[facility] PROGRESS NOTE  JORY WELKE XFG:[id number] DOB: 1940/04/21 DOA: 11/14/2013 PCP: Hollace Kinnier, DO  Assessment/Plan: 73 y.o. female PMH of CAD s/p CABG, s/p AVR, h/o CVA, PAF on AC (coumadin), CHF, Pulmonary HTN, Pulmonary Fibrosis, chronic respiratory failure on home oxygen,  Lupus, h/o recurrent endocarditis, on amoxicillin for endocarditis prophylaxis, discharged 8/21 was brought by her daughter after she was found on the floor near the bed with aphasia -8/22 admitted with acute CVA, hypoxia, fever found to have C diff, CHF    1. Acute CVA; MRI + Subcentimeter focus of acute infarct in the left frontal cortex -Patient developed CVA while on coumadin with INR 2.6; ? 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 Terrence Dupont patient is likely to have vegetations but unfortunately not a candidate for surgery    2. Acute on chronic CHF with pulmonary HTN; echo (08/2013): LVEF 55%, AR, MR, TR, PA 81 mmHg -CXR: pulmonary edema, clinically volume overloaded;  -I/O neg 3.2L; cont IV diuresis 24 hrs; PO ? AM ; cont  I/O, daily weight; cont BB, may need ACE; replace lytes  3. H/o recurrent endocarditis (enterococcal), Pt was on amoxicillin for endocarditis prophylaxis -started on IV atx; blood cultures NGTD; previous cultures neg; per ID d/c IV atx, switched to PO Augmentin 8/24  -d/w cardiology regarding vegetation; Per Dr. Terrence Dupont patient is likely to have vegetations but unfortunately not a candidate for surgery 4. C diff +, recurrent; started PO vanc; per ID: 14 days with oral vancomycin 125 mg qid; last dose 9/8 5. CAD s/p CABG, Pt denies acute chest pain; cont home regimen  6. PAF on AC (coumadin), cont amiodarone, BB, coumadin  7. Pulmonary Fibrosis, chronic respiratory failure on home oxygen; cont  bronchodilators prn, oxygen    Prognosis seems poor due to recurrent infective endocarditis, acute on chronic CVA on anticoagulation, acute on chronic CHF, with severe pulmonary HTN;     -D/w patient, updated her daughter recently; all questions answered     Code Status: DNR Family Communication: d/w patient, updated  [name] Daughter [phone]   (indicate person spoken with, relationship, and if by phone, the number) Disposition Plan: pend  Clinical improvement, PT   MRA NECK FINDINGS  Suboptimal image quality. Suboptimal timing of the injection.  The internal carotid artery appears patent without significant  stenosis bilaterally. Both vertebral arteries are patent without  significant stenosis.  IMPRESSION:  Subcentimeter focus of acute infarct in the left frontal cortex.  Mild intracranial atherosclerotic disease most prominent in the  right middle cerebral artery.  Suboptimal MRA of the neck without significant carotid or vertebral  stenosis in the neck.   Consultants:  neuroogy  Procedures: Study Conclusions  - Left ventricle: E/e&'>14 suggestive of elevated LV filling pressures. The cavity size was normal. There was moderate concentric hypertrophy. Systolic function was normal. The estimated ejection fraction was in the range of 60% to 65%. Wall motion was normal; there were no regional wall motion abnormalities. - Aortic valve: Mild diffuse thickening and calcification, consistent with sclerosis. There was mild regurgitation directed eccentrically in the LVOT. Valve area (VTI): 1.14 cm^2. Valve area (Vmax): 1.24 cm^2. Valve area (Vmean): 1.2 cm^2. - Mitral valve: Moderately calcified annulus. There was mild regurgitation. - Left atrium: The atrium was severely dilated. - Right atrium: The atrium was severely dilated. - Tricuspid valve: There  was moderate-severe regurgitation. - Pulmonic valve: There was mild regurgitation. - Pulmonary  arteries: PA peak pressure: 76 mm Hg (S).  Impressions:  - The right ventricular systolic pressure was increased consistent with severe pulmonary hypertension.     Antibiotics:  vanc 8/22<<<< 8/24  Zosyn 8/22<<<<8/24  Vanc PO 8/23<<<<<  (indicate start date, and stop date if known)  HPI/Subjective: alert  Objective: Filed Vitals:   11/17/13 0743  BP: 140/72  Pulse: 104  Temp: 98.5 F (36.9 C)  Resp: 15    Intake/Output Summary (Last 24 hours) at 11/17/13 0748 Last data filed at 11/17/13 0400  Gross per 24 hour  Intake    360 ml  Output   1175 ml  Net   -815 ml   Filed Weights   11/14/13 0917 11/14/13 1400 11/14/13 2214  Weight: 78.472 kg (173 lb) 78.3 kg (172 lb 9.9 oz) 78.427 kg (172 lb 14.4 oz)    Exam:   General:  alert  Cardiovascular: s1,s2 sys mr   Respiratory: BL crackle in LL  Abdomen: soft, nt,nd   Musculoskeletal: no mild edema   Data Reviewed: Basic Metabolic Panel:  Recent Labs Lab 11/12/13 0500 11/14/13 0923 11/14/13 1301 11/15/13 0340 11/17/13 0435  NA 139 139 141 140 139  K 4.5 5.7* 4.8 3.8 3.3*  CL 109 106 108 108 101  CO2 16* 15* 15* 18* 25  GLUCOSE 130* 76 74 89 107*  BUN 34* 40* 42* 42* 19  CREATININE 1.03 1.08 1.15* 1.19* 0.90  CALCIUM 9.4 9.3 9.1 8.4 8.4   Liver Function Tests:  Recent Labs Lab 11/11/13 0304 11/12/13 0500 11/14/13 0923  AST 142* 83* 54*  ALT 603* 473* 308*  ALKPHOS 146* 150* 144*  BILITOT 1.2 1.1 2.3*  PROT 6.1 6.2 7.1  ALBUMIN 2.6* 2.7* 3.3*   No results found for this basename: LIPASE, AMYLASE,  in the last 168 hours No results found for this basename: AMMONIA,  in the last 168 hours CBC:  Recent Labs Lab 11/11/13 0304 11/12/13 0500 11/14/13 0923 11/15/13 0340 11/17/13 0435  WBC 5.7 6.7 14.9* 6.8 9.1  NEUTROABS  --   --  11.7*  --   --   HGB 10.6* 11.2* 12.5 10.3* 12.6  HCT 31.2* 33.5* 39.3 30.4* 38.4  MCV 95.7 96.0 97.0 97.4 99.5  PLT 62* 81* 145* 102* 108*   Cardiac  Enzymes:  Recent Labs Lab 11/14/13 0923  TROPONINI <0.30   BNP (last 3 results)  Recent Labs  09/12/13 1122 11/07/13 1000 11/14/13 0923  PROBNP 16828.0* 24587.0* 13807.0*   CBG: No results found for this basename: GLUCAP,  in the last 168 hours  Recent Results (from the past 240 hour(s))  URINE CULTURE     Status: None   Collection Time    11/07/13  7:56 AM      Result Value Ref Range Status   Specimen Description URINE, CATHETERIZED   Final   Special Requests NONE   Final   Culture  Setup Time     Final   Value: 11/07/2013 18:34     Performed at [facility]     Final   Value: NO GROWTH     Performed at [facility]   Culture     Final   Value: NO GROWTH     Performed at [facility]   Report Status 11/08/2013 FINAL   Final  CULTURE, BLOOD (ROUTINE X 2)  Status: None   Collection Time    11/07/13  8:20 AM      Result Value Ref Range Status   Specimen Description BLOOD LEFT ARM   Final   Special Requests BOTTLES DRAWN AEROBIC ONLY 4CC   Final   Culture  Setup Time     Final   Value: 11/07/2013 18:00     Performed at [facility]   Culture     Final   Value: NO GROWTH 5 DAYS     Performed at [facility]   Report Status 11/13/2013 FINAL   Final  CULTURE, BLOOD (ROUTINE X 2)     Status: None   Collection Time    11/07/13  8:27 AM      Result Value Ref Range Status   Specimen Description BLOOD LEFT HAND   Final   Special Requests BOTTLES DRAWN AEROBIC ONLY 3CC   Final   Culture  Setup Time     Final   Value: 11/07/2013 18:00     Performed at [facility]   Culture     Final   Value: NO GROWTH 5 DAYS     Performed at [facility]   Report Status 11/13/2013 FINAL   Final  MRSA PCR SCREENING     Status: None   Collection Time    11/07/13  2:09 PM      Result Value Ref Range Status   MRSA by PCR NEGATIVE  NEGATIVE Final   Comment:            The GeneXpert MRSA Assay (FDA      approved for NASAL specimens     only), is one component of a     comprehensive MRSA colonization     surveillance program. It is not     intended to diagnose MRSA     infection nor to guide or     monitor treatment for     MRSA infections.  CULTURE, BLOOD (ROUTINE X 2)     Status: None   Collection Time    11/14/13  9:48 AM      Result Value Ref Range Status   Specimen Description BLOOD LEFT HAND   Final   Special Requests BOTTLES DRAWN AEROBIC ONLY 5ML   Final   Culture  Setup Time     Final   Value: 11/14/2013 20:00     Performed at [facility]   Culture     Final   Value:        BLOOD CULTURE RECEIVED NO GROWTH TO DATE CULTURE WILL BE HELD FOR 5 DAYS BEFORE ISSUING A FINAL NEGATIVE REPORT     Performed at [facility]   Report Status PENDING   Incomplete  CULTURE, BLOOD (ROUTINE X 2)     Status: None   Collection Time    11/14/13 11:45 AM      Result Value Ref Range Status   Specimen Description BLOOD RIGHT HAND   Final   Special Requests BOTTLES DRAWN AEROBIC AND ANAEROBIC B 5CC R 4CC   Final   Culture  Setup Time     Final   Value: 11/15/2013 00:09     Performed at [facility]   Culture     Final   Value:        BLOOD CULTURE RECEIVED NO GROWTH TO DATE CULTURE WILL BE HELD FOR 5 DAYS BEFORE ISSUING A FINAL NEGATIVE REPORT  Performed at [facility]   Report Status PENDING   Incomplete  CLOSTRIDIUM DIFFICILE BY PCR     Status: Abnormal   Collection Time    11/15/13  2:34 PM      Result Value Ref Range Status   C difficile by pcr POSITIVE (*) NEGATIVE Final   Comment: CRITICAL RESULT CALLED TO, READ BACK BY AND VERIFIED WITH:     LATOYA SILVA 1615 BY [place] 11/15/13     Studies: Dg Chest 2 View  11/15/2013   CLINICAL DATA:  Suspect pneumonia; history of CHF  EXAM: CHEST  2 VIEW  COMPARISON:  .  Portable chest x-ray of November 14, 2013  FINDINGS: The right lung is less well inflated today. The interstitial markings are increased  bilaterally. The cardiopericardial silhouette is enlarged. The pulmonary vascularity is indistinct. There is dense calcification in the mitral valvular annulus. A trace of pleural fluid blunts the costophrenic angles bilaterally. The patient has undergone previous median sternotomy. The bony thorax is unremarkable on the frontal film. On the lateral film the thoracic spine cannot be adequately assessed.  IMPRESSION: Mildly increased interstitial markings bilaterally are slightly more conspicuous today. This may reflect worsening of CHF. No discrete alveolar pneumonia is demonstrated.   Electronically Signed   By: David  Martinique   On: 11/15/2013 13:54   Dg Chest Port 1 View  11/16/2013   CLINICAL DATA:  PICC line placement.  EXAM: PORTABLE CHEST - 1 VIEW  COMPARISON:  None.  FINDINGS: The heart is enlarged but stable. Persistent edema and atelectasis. The right PICC line tip is located just below the level of the carina, approximately 4 cm above the cavoatrial junction. No pleural effusion or pneumothorax.  IMPRESSION: The right PICC line tip is in the mid SVC.   Electronically Signed   By: Kalman Jewels M.D.   On: 11/16/2013 13:03    Scheduled Meds: . amiodarone  200 mg Oral Daily  . atorvastatin  20 mg Oral q1800  . famotidine  20 mg Oral Daily  . feeding supplement (ENSURE COMPLETE)  237 mL Oral TID BM  . furosemide  20 mg Oral BID  . gabapentin  300 mg Oral QHS  . hydroxychloroquine  200 mg Oral BID  . metoprolol tartrate  25 mg Oral Daily  . mirtazapine  7.5 mg Oral QHS  . potassium chloride  20 mEq Oral BID  . saccharomyces boulardii  250 mg Oral BID  . vancomycin  250 mg Oral 4 times per day  . Warfarin - Pharmacist Dosing Inpatient   Does not apply q1800   Continuous Infusions:   Active Problems:   CORONARY ARTERY DISEASE   Atrial fibrillation   CORONARY ARTERY BYPASS GRAFT, HX OF   Prosthetic valve endocarditis   Protein-calorie malnutrition, severe   Lupus   CKD (chronic  kidney disease) stage 3, GFR 30-59 ml/min   Fever   CVA (cerebral vascular accident)   Aphasia   Diarrhea    Time spent: >35 minutes     Kinnie Feil  [facility] Pager [phone]. If 7PM-7AM, please contact night-coverage at [email or web address], password [facility] 11/17/2013, 7:48 AM  LOS: 3 days

## 2013-11-17 NOTE — Progress Notes (Signed)
Follow up visit with family. However, nephew states he wants to speak with other family members first. States [name] will be the fist contact regarding services. Penny not at bedside at this time. Will attempt to reach [facility] by phone if not able to see her at bedside at [phone]. Made inpatient RNCM aware.  Marthenia Rolling, MSN- [facility] [phone]

## 2013-11-17 NOTE — Progress Notes (Signed)
Physical Therapy Treatment Patient Details Name: [name] MRN: [id number] DOB: 1940/04/03 Today's Date: 11/17/2013    History of Present Illness [name] is a 73 y.o. female with prior h/o recurrent endocarditis, on amoxicillin for endocarditis prophylaxis, atrial fibrillation on coumadin, CAD, pulmonary fibrosis, h/o c diff colitis,  anemia, chronic diastolic heart failure, discharged yesterday, was brought by her daughter after she was found on the floor near the bed . Most of the history available from the daughters at bedside. The daughter who lives with the patient reports she heard a sound in the room and found her lying beside the bed. She was also found to be in sob, wheezing and gasping for air. Her oxygen sats were in low 80's. On arrival to ED, her rectal temp was 101, she was found to have some leukocytosis on lab work. Her CXR and UA did not reveal a source of infection. But she had two loose bm's since yesterday. No rash seen on the skin.     PT Comments    Pt was feeling weak but agreed to sit up bedside, then to chair with grandson in attendance.  Pt felt a little dizzy initially but more steady after sitting in the chair.  Pt was instructed to ask nsg for help to bed and nsg told her ability and discussed the chair alarm, and decided not needed.  Follow Up Recommendations  SNF;Supervision/Assistance - 24 hour     Equipment Recommendations  None recommended by PT    Recommendations for Other Services OT consult     Precautions / Restrictions Precautions Precautions: Fall Restrictions Weight Bearing Restrictions: No Other Position/Activity Restrictions: bed alarm    Mobility  Bed Mobility Overal bed mobility: Needs Assistance Bed Mobility: Supine to Sit;Rolling     Supine to sit: Mod assist Sit to supine: Mod assist   General bed mobility comments: Cued to use rail but then assisted as she could once PT helped initiate.  Transfers Overall transfer  level: Needs assistance Equipment used: 1 person hand held assist Transfers: Sit to/from [facility] Sit to Stand: Max assist Stand pivot transfers: Max assist       General transfer comment: Initial effort to stand was substantial then pivoted with a couple sidesteps, pt motivated to try but weak   Ambulation/Gait                 Stairs            Wheelchair Mobility    Modified Rankin (Stroke Patients Only)       Balance Overall balance assessment: Needs assistance   Sitting balance-Leahy Scale: Poor Sitting balance - Comments: Fluctuating between min and mod but finally min before first standing attempt.  Pt was alert but feeling dizzy then more comfortable sitting in chair with pillow behind back Postural control: Other (comment) (Flexed standing posture due to difficulty standing) Standing balance support: Bilateral upper extremity supported;During functional activity Standing balance-Leahy Scale: Poor                      Cognition Arousal/Alertness: Awake/alert Behavior During Therapy: Flat affect Overall Cognitive Status: Within Functional Limits for tasks assessed     Current Attention Level: Focused Memory: Decreased short-term memory Following Commands: Follows one step commands consistently Safety/Judgement: Decreased awareness of safety   Problem Solving: Requires verbal cues;Requires tactile cues;Decreased initiation General Comments: More alert today with grandson in and talking to him, feeling a little dizzy  when up but better in chair    Exercises      General Comments        Pertinent Vitals/Pain Pain Assessment: No/denies pain 140/72, pulse 104, O2 sat 99% per nsg notes.    Home Living                      Prior Function            PT Goals (current goals can now be found in the care plan section) Acute Rehab PT Goals Patient Stated Goal: to go home Progress towards PT goals: Progressing  toward goals    Frequency  Min 2X/week    PT Plan Current plan remains appropriate;Frequency needs to be updated    Co-evaluation             End of Session Equipment Utilized During Treatment: Oxygen Activity Tolerance: Patient limited by lethargy Patient left: in chair;with call bell/phone within reach;with family/visitor present     Time: 1443-1540 PT Time Calculation (min): 27 min  Charges:  $Therapeutic Activity: 8-22 mins $Neuromuscular Re-education: 8-22 mins                    G Codes:      Ramond Dial 11/27/2013, 10:29 AM  Mee Hives, PT MS Acute Rehab Dept. Number: [phone]

## 2013-11-18 DIAGNOSIS — E43 Unspecified severe protein-calorie malnutrition: Secondary | ICD-10-CM

## 2013-11-18 LAB — BASIC METABOLIC PANEL
ANION GAP: 8 (ref 5–15)
BUN: 15 mg/dL (ref 6–23)
CHLORIDE: 104 meq/L (ref 96–112)
CO2: 30 mEq/L (ref 19–32)
Calcium: 8 mg/dL — ABNORMAL LOW (ref 8.4–10.5)
Creatinine, Ser: 0.81 mg/dL (ref 0.50–1.10)
GFR calc non Af Amer: 69 mL/min — ABNORMAL LOW (ref >90)
GFR, EST AFRICAN AMERICAN: 80 mL/min — AB (ref >90)
Glucose, Bld: 116 mg/dL — ABNORMAL HIGH (ref 70–99)
POTASSIUM: 3.3 meq/L — AB (ref 3.7–5.3)
SODIUM: 142 meq/L (ref 137–147)

## 2013-11-18 LAB — PROTIME-INR
INR: 2.24 — AB (ref 0.00–1.49)
PROTHROMBIN TIME: 24.8 s — AB (ref 11.6–15.2)

## 2013-11-18 MED ORDER — FUROSEMIDE 40 MG PO TABS
40.0000 mg | ORAL_TABLET | Freq: Every day | ORAL | Status: DC
  Administered 2013-11-18 – 2013-11-19 (×2): 40 mg via ORAL
  Filled 2013-11-18 (×2): qty 1

## 2013-11-18 MED ORDER — POLYETHYLENE GLYCOL 3350 17 G PO PACK
17.0000 g | PACK | Freq: Every day | ORAL | Status: DC
  Administered 2013-11-18: 17 g via ORAL
  Filled 2013-11-18 (×2): qty 1

## 2013-11-18 MED ORDER — POTASSIUM CHLORIDE CRYS ER 20 MEQ PO TBCR
40.0000 meq | EXTENDED_RELEASE_TABLET | Freq: Once | ORAL | Status: AC
  Administered 2013-11-18: 40 meq via ORAL
  Filled 2013-11-18: qty 2

## 2013-11-18 MED ORDER — BISACODYL 10 MG RE SUPP
10.0000 mg | Freq: Once | RECTAL | Status: AC
  Administered 2013-11-18: 10 mg via RECTAL
  Filled 2013-11-18: qty 1

## 2013-11-18 MED ORDER — WARFARIN SODIUM 4 MG PO TABS
4.0000 mg | ORAL_TABLET | Freq: Every day | ORAL | Status: DC
  Administered 2013-11-18: 4 mg via ORAL
  Filled 2013-11-18 (×2): qty 1

## 2013-11-18 MED ORDER — LACTULOSE 10 GM/15ML PO SOLN
20.0000 g | Freq: Two times a day (BID) | ORAL | Status: DC | PRN
  Filled 2013-11-18: qty 30

## 2013-11-18 NOTE — Progress Notes (Signed)
ANTICOAGULATION CONSULT NOTE - Follow Up Consult  Pharmacy Consult for warfarin Indication: atrial fibrillation  Allergies  Allergen Reactions  . Ceftriaxone     RASH BUT TOLERATED AMPICILLIN AND IMIPENEM WITHOUT PROBLEMS  . Codeine Nausea Only    Labs:  Recent Labs  11/16/13 0330 11/17/13 0435 11/18/13 0517  HGB  --  12.6  --   HCT  --  38.4  --   PLT  --  108*  --   LABPROT 30.1* 26.1* 24.8*  INR 2.87* 2.39* 2.24*  CREATININE  --  0.90 0.81    Estimated Creatinine Clearance: 63.4 ml/min (by C-G formula based on Cr of 0.81).  Assessment: 19 yof admitted after a fall at home, who is Cdiff positive (vanc po) and now off broad spectrum antibiotics. She continues on chronic coumadin.  INR = 2.24   Goal of Therapy:  INR 2-3   Plan:  1. Warfarin 4 mg PO daily (home dose) 2. F/u AM INR  Thank you. Anette Guarneri, PharmD [phone]  11/18/2013,10:41 AM

## 2013-11-18 NOTE — Progress Notes (Signed)
CSW (Clinical Education officer, museum) informed that family is reconsidering SNF after speaking with MD. CSW visited pt room and spoke with pt and pt son. Pt very quiet during conversation but did state she was aware that MD is concerned with her returning home right away. CSW explained SNF referral process and pt and pt son agreeable to referral being sent to all [facility] facilities (with the exception of one). CSW to provide bed offers and speak further with pt to confirm she is agreeable to dc to SNF for ST rehab.  [place], [place]

## 2013-11-18 NOTE — Progress Notes (Addendum)
Clinical Social Work Department CLINICAL SOCIAL WORK PLACEMENT NOTE 11/18/2013  Patient:  [name], [name]  Account Number:  [id number] Admit date:  11/14/2013  Clinical Social Worker:  Berton Mount, Latanya Presser  Date/time:  11/18/2013 11:00 AM  Clinical Social Work is seeking post-discharge placement for this patient at the following level of care:   [place]   (*CSW will update this form in Epic as items are completed)   11/18/2013  Patient/family provided with [employer] Department of Clinical Social Work's list of facilities offering this level of care within the geographic area requested by the patient (or if unable, by the patient's family).  11/18/2013  Patient/family informed of their freedom to choose among providers that offer the needed level of care, that participate in Medicare, Medicaid or managed care program needed by the patient, have an available bed and are willing to accept the patient.  11/18/2013  Patient/family informed of [facility]' ownership interest in [facility], as well as of the fact that they are under no obligation to receive care at this facility.  PASARR submitted to EDS on 11/18/2013 PASARR number received on 11/18/2013  FL2 transmitted to all facilities in geographic area requested by pt/family on  11/18/2013 FL2 transmitted to all facilities within larger geographic area on   Patient informed that his/her managed care company has contracts with or will negotiate with  certain facilities, including the following:     Patient/family informed of bed offers received:  11/18/2013 Patient chooses bed at [facility] Physician recommends and patient chooses bed at    Patient to be transferred [facility]  on  11/19/2013 Patient to be transferred to facility by [facility] Patient and family notified of transfer on 11/19/2013 Name of family member notified:  [name] (daughter)  The following physician request were entered in Epic: Physician  Request  Please sign FL2.    Additional CommentsBerton Mount, [place]

## 2013-11-18 NOTE — Progress Notes (Signed)
[facility] PROGRESS NOTE  [name] WFU:[id number] DOB: June 03, 1940 DOA: 11/14/2013 PCP: Hollace Kinnier, DO  Assessment/Plan: 73 y.o. female PMH of CAD s/p CABG, s/p AVR, h/o CVA, PAF on AC (coumadin), CHF, Pulmonary HTN, Pulmonary Fibrosis, chronic respiratory failure on home oxygen,  Lupus, h/o recurrent endocarditis, on amoxicillin for endocarditis prophylaxis, discharged 8/21 was brought by her daughter after she was found on the floor near the bed with aphasia -8/22 admitted with acute CVA, hypoxia, fever found to have C diff, CHF    1. Acute CVA; MRI + Subcentimeter focus of acute infarct in the left frontal cortex -Patient developed CVA while on coumadin with INR 2.6; ? 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 Terrence Dupont patient is likely to have vegetations but unfortunately not a candidate for surgery -ID recommending oral suppressive therapy with amoxicillin  2. Acute on chronic CHF with pulmonary HTN; echo (08/2013): LVEF 55%, AR, MR, TR, PA 81 mmHg -Improved clinically -Has a net negative fluid balance of 3.5 Liters -Continue Lasix 40 mg PO q daily 3. H/o recurrent endocarditis (enterococcal), Pt was on amoxicillin for endocarditis prophylaxis -started on IV atx; blood cultures NGTD; previous cultures neg; per ID d/c IV atx, switched to PO Augmentin 8/24  -d/w cardiology regarding vegetation; Per Dr. Terrence Dupont patient is likely to have vegetations but unfortunately not a candidate for surgery 4. C diff +, recurrent; started PO vanc; per ID: 14 days with oral vancomycin 125 mg qid; last dose 9/8 5. CAD s/p CABG, Pt denies acute chest pain; cont home regimen  6. PAF on AC (coumadin), cont amiodarone, BB, coumadin  7. Pulmonary Fibrosis, chronic respiratory failure on home oxygen; cont bronchodilators  prn, oxygen    Prognosis seems poor due to recurrent infective endocarditis, acute on chronic CVA on anticoagulation, acute on chronic CHF, with severe pulmonary HTN;       Code Status: DNR Family Communication: d/w patient, updated  [name] Daughter [phone]   (indicate person spoken with, relationship, and if by phone, the number) Disposition Plan: Anticipate discharge to SNF, discussed with family  MRA NECK FINDINGS  Suboptimal image quality. Suboptimal timing of the injection.  The internal carotid artery appears patent without significant  stenosis bilaterally. Both vertebral arteries are patent without  significant stenosis.  IMPRESSION:  Subcentimeter focus of acute infarct in the left frontal cortex.  Mild intracranial atherosclerotic disease most prominent in the  right middle cerebral artery.  Suboptimal MRA of the neck without significant carotid or vertebral  stenosis in the neck.   Consultants:  neuroogy  Procedures: Study Conclusions  - Left ventricle: E/e&'>14 suggestive of elevated LV filling pressures. The cavity size was normal. There was moderate concentric hypertrophy. Systolic function was normal. The estimated ejection fraction was in the range of 60% to 65%. Wall motion was normal; there were no regional wall motion abnormalities. - Aortic valve: Mild diffuse thickening and calcification, consistent with sclerosis. There was mild regurgitation directed eccentrically in the LVOT. Valve area (VTI): 1.14 cm^2. Valve area (Vmax): 1.24 cm^2. Valve area (Vmean): 1.2 cm^2. - Mitral valve: Moderately calcified annulus. There was mild regurgitation. - Left atrium: The atrium was severely dilated. - Right atrium: The atrium was severely dilated. - Tricuspid valve: There was moderate-severe regurgitation. - Pulmonic valve: There was mild regurgitation. - Pulmonary arteries: PA peak pressure: 76 mm Hg (  S).  Impressions:  - The right  ventricular systolic pressure was increased consistent with severe pulmonary hypertension.     Antibiotics:  vanc 8/22<<<< 8/24  Zosyn 8/22<<<<8/24  Vanc PO 8/23<<<<<  (indicate start date, and stop date if known)  HPI/Subjective: Patient is awake and alert, family present at bedside.   Objective: Filed Vitals:   11/18/13 1200  BP: 117/58  Pulse: 66  Temp: 98.6 F (37 C)  Resp: 18    Intake/Output Summary (Last 24 hours) at 11/18/13 1526 Last data filed at 11/17/13 1740  Gross per 24 hour  Intake      0 ml  Output      5 ml  Net     -5 ml   Filed Weights   11/14/13 1400 11/14/13 2214 11/18/13 0328  Weight: 78.3 kg (172 lb 9.9 oz) 78.427 kg (172 lb 14.4 oz) 78.159 kg (172 lb 5 oz)    Exam:   General:  alert  Cardiovascular: s1,s2 sys mr   Respiratory: BL crackle in LL  Abdomen: soft, nt,nd   Musculoskeletal: no mild edema   Data Reviewed: Basic Metabolic Panel:  Recent Labs Lab 11/14/13 0923 11/14/13 1301 11/15/13 0340 11/17/13 0435 11/18/13 0517  NA 139 141 140 139 142  K 5.7* 4.8 3.8 3.3* 3.3*  CL 106 108 108 101 104  CO2 15* 15* 18* 25 30  GLUCOSE 76 74 89 107* 116*  BUN 40* 42* 42* 19 15  CREATININE 1.08 1.15* 1.19* 0.90 0.81  CALCIUM 9.3 9.1 8.4 8.4 8.0*   Liver Function Tests:  Recent Labs Lab 11/12/13 0500 11/14/13 0923  AST 83* 54*  ALT 473* 308*  ALKPHOS 150* 144*  BILITOT 1.1 2.3*  PROT 6.2 7.1  ALBUMIN 2.7* 3.3*   No results found for this basename: LIPASE, AMYLASE,  in the last 168 hours No results found for this basename: AMMONIA,  in the last 168 hours CBC:  Recent Labs Lab 11/12/13 0500 11/14/13 0923 11/15/13 0340 11/17/13 0435  WBC 6.7 14.9* 6.8 9.1  NEUTROABS  --  11.7*  --   --   HGB 11.2* 12.5 10.3* 12.6  HCT 33.5* 39.3 30.4* 38.4  MCV 96.0 97.0 97.4 99.5  PLT 81* 145* 102* 108*   Cardiac Enzymes:  Recent Labs Lab 11/14/13 0923  TROPONINI <0.30   BNP (last 3 results)  Recent Labs   09/12/13 1122 11/07/13 1000 11/14/13 0923  PROBNP 16828.0* 24587.0* 13807.0*   CBG: No results found for this basename: GLUCAP,  in the last 168 hours  Recent Results (from the past 240 hour(s))  CULTURE, BLOOD (ROUTINE X 2)     Status: None   Collection Time    11/14/13  9:48 AM      Result Value Ref Range Status   Specimen Description BLOOD LEFT HAND   Final   Special Requests BOTTLES DRAWN AEROBIC ONLY 5ML   Final   Culture  Setup Time     Final   Value: 11/14/2013 20:00     Performed at [facility]   Culture     Final   Value:        BLOOD CULTURE RECEIVED NO GROWTH TO DATE CULTURE WILL BE HELD FOR 5 DAYS BEFORE ISSUING A FINAL NEGATIVE REPORT     Performed at [facility]   Report Status PENDING   Incomplete  CULTURE, BLOOD (ROUTINE X 2)     Status: None   Collection Time  11/14/13 11:45 AM      Result Value Ref Range Status   Specimen Description BLOOD RIGHT HAND   Final   Special Requests BOTTLES DRAWN AEROBIC AND ANAEROBIC B 5CC R 4CC   Final   Culture  Setup Time     Final   Value: 11/15/2013 00:09     Performed at [facility]   Culture     Final   Value:        BLOOD CULTURE RECEIVED NO GROWTH TO DATE CULTURE WILL BE HELD FOR 5 DAYS BEFORE ISSUING A FINAL NEGATIVE REPORT     Performed at [facility]   Report Status PENDING   Incomplete  CLOSTRIDIUM DIFFICILE BY PCR     Status: Abnormal   Collection Time    11/15/13  2:34 PM      Result Value Ref Range Status   C difficile by pcr POSITIVE (*) NEGATIVE Final   Comment: CRITICAL RESULT CALLED TO, READ BACK BY AND VERIFIED WITH:     LATOYA SILVA 1615 BY [place] 11/15/13     Studies: No results found.  Scheduled Meds: . amiodarone  200 mg Oral Daily  . amoxicillin  500 mg Oral Q12H  . atorvastatin  20 mg Oral q1800  . famotidine  20 mg Oral Daily  . feeding supplement (ENSURE COMPLETE)  237 mL Oral TID BM  . furosemide  40 mg Oral Daily  . gabapentin  300 mg Oral QHS   . hydroxychloroquine  200 mg Oral BID  . metoprolol tartrate  25 mg Oral Daily  . mirtazapine  7.5 mg Oral QHS  . polyethylene glycol  17 g Oral Daily  . potassium chloride  30 mEq Oral BID  . saccharomyces boulardii  250 mg Oral BID  . vancomycin  250 mg Oral 4 times per day  . warfarin  4 mg Oral q1800  . Warfarin - Pharmacist Dosing Inpatient   Does not apply q1800   Continuous Infusions:   Active Problems:   CORONARY ARTERY DISEASE   Atrial fibrillation   CORONARY ARTERY BYPASS GRAFT, HX OF   Prosthetic valve endocarditis   Protein-calorie malnutrition, severe   Lupus   CKD (chronic kidney disease) stage 3, GFR 30-59 ml/min   Fever   CVA (cerebral vascular accident)   Aphasia   Diarrhea    Time spent: 25 minutes     Kelvin Cellar  [facility] Pager [phone]. If 7PM-7AM, please contact night-coverage at [email or web address], password [facility] 11/18/2013, 3:26 PM  LOS: 4 days

## 2013-11-18 NOTE — Progress Notes (Signed)
Occupational Therapy Evaluation Patient Details Name: [name] MRN: [id number] DOB: 1940/06/27 Today's Date: 11/18/2013    History of Present Illness  73 y.o. female with prior h/o recurrent endocarditis, atrial fibrillation, CAD, pulmonary fibrosis, chronic diastolic heart failure, discharged 8/22, was brought by her daughter after she was found on the floor near the bed . The daughter who lives with the patient reports she heard a sound in the room and found her lying beside the bed. She was also found to be in sob, wheezing and gasping for air. Her oxygen sats were in low 80's. On arrival to ED, her rectal temp was 101, she was found to have some leukocytosis   Clinical Impression   PTA, pt had assistance for ADL and mobility due to recent hospitalization; however, PLOF is mod I. Pt's son in room during evaluation. Pt requires Max A with mobility and ADL. Discussed D/C recommendation of SNF, explaining that SNF is for rehab. AFter observing amount of assistance required for mobility and discussed rehab options, pt and son agreeable to SNF for rehab. Pt will benefit from skilled OT services to facilitate D/C to next venue due to below deficits.    Follow Up Recommendations  SNF;Supervision/Assistance - 24 hour    Equipment Recommendations  None recommended by OT    Recommendations for Other Services       Precautions / Restrictions Precautions Precautions: Fall Precaution Comments: watch O2 sats Restrictions Weight Bearing Restrictions: No      Mobility Bed Mobility Overal bed mobility: Needs Assistance Bed Mobility: Supine to Sit     Supine to sit: Max assist        Transfers Overall transfer level: Needs assistance   Transfers: Sit to/from Stand;Stand Pivot Transfers Sit to Stand: Max assist Stand pivot transfers: Max assist       General transfer comment: Pt had difficulty maintaining standing tolerance    Balance   Sitting-balance support: Feet  supported;Bilateral upper extremity supported Sitting balance-Leahy Scale: Poor Sitting balance - Comments: posterior lean   Standing balance support: During functional activity Standing balance-Leahy Scale: Poor                              ADL Overall ADL's : Needs assistance/impaired     Grooming: Set up;Sitting   Upper Body Bathing: Minimal assitance;Sitting   Lower Body Bathing: Maximal assistance;Sit to/from stand   Upper Body Dressing : Moderate assistance;Sitting   Lower Body Dressing: Maximal assistance;Sit to/from stand   Toilet Transfer: Maximal assistance;Stand-pivot   Toileting- Clothing Manipulation and Hygiene: Total assistance Toileting - Clothing Manipulation Details (indicate cue type and reason): pt incontinent of urine     Functional mobility during ADLs: Maximal assistance       Vision                     Perception     Praxis      Pertinent Vitals/Pain Pain Assessment: No/denies pain     Hand Dominance Right   Extremity/Trunk Assessment Upper Extremity Assessment Upper Extremity Assessment: Generalized weakness   Lower Extremity Assessment Lower Extremity Assessment: Generalized weakness   Cervical / Trunk Assessment Cervical / Trunk Assessment: Kyphotic   Communication Communication Communication: No difficulties   Cognition Arousal/Alertness: Awake/alert Behavior During Therapy: WFL for tasks assessed/performed Overall Cognitive Status: Impaired/Different from baseline Area of Impairment: Problem solving;Safety/judgement         Safety/Judgement: Decreased awareness  of deficits   Problem Solving: Slow processing     General Comments       Exercises Exercises: Other exercises Other Exercises Other Exercises: general BUE/LE AROM in sitting. Encouraged feet on floor to complete exercises and then elevate BLE   Shoulder Instructions      Home Living Family/patient expects to be discharged to::  Skilled nursing facility                                        Prior Functioning/Environment Level of Independence: Independent with assistive device(s)  Gait / Transfers Assistance Needed: Mod I RW level          OT Diagnosis: Generalized weakness;Cognitive deficits   OT Problem List: Decreased strength;Decreased activity tolerance;Impaired balance (sitting and/or standing);Decreased safety awareness;Decreased cognition;Decreased knowledge of use of DME or AE;Cardiopulmonary status limiting activity;Obesity   OT Treatment/Interventions: Self-care/ADL training;Therapeutic exercise;Energy conservation;DME and/or AE instruction;Therapeutic activities;Cognitive remediation/compensation;Patient/family education;Balance training    OT Goals(Current goals can be found in the care plan section) Acute Rehab OT Goals Patient Stated Goal: to go home OT Goal Formulation: With patient Time For Goal Achievement: 12/02/13 Potential to Achieve Goals: Good  OT Frequency: Min 2X/week   Barriers to D/C:            Co-evaluation              End of Session Equipment Utilized During Treatment: Gait belt Nurse Communication: Mobility status  Activity Tolerance: Patient limited by fatigue Patient left: in chair;with call bell/phone within reach;with family/visitor present   Time: 0814-4818 OT Time Calculation (min): 37 min Charges:  OT General Charges $OT Visit: 1 Procedure OT Evaluation $Initial OT Evaluation Tier I: 1 Procedure OT Treatments $Self Care/Home Management : 23-37 mins G-Codes:    Nashae Maudlin,H[place] Nov 20, 2013, 11:44 AM   Maurie Boettcher, OTR/L  [phone] 11-20-2013

## 2013-11-19 DIAGNOSIS — R627 Adult failure to thrive: Secondary | ICD-10-CM

## 2013-11-19 LAB — PROTIME-INR
INR: 2 — AB (ref 0.00–1.49)
PROTHROMBIN TIME: 22.7 s — AB (ref 11.6–15.2)

## 2013-11-19 MED ORDER — WARFARIN SODIUM 4 MG PO TABS: 4.0000 mg | ORAL_TABLET | Freq: Every day | ORAL | Status: DC

## 2013-11-19 MED ORDER — WARFARIN SODIUM 6 MG PO TABS
6.0000 mg | ORAL_TABLET | Freq: Once | ORAL | Status: DC
Start: 2013-11-19 — End: 2013-11-19
  Filled 2013-11-19: qty 1

## 2013-11-19 MED ORDER — VANCOMYCIN 50 MG/ML ORAL SOLUTION
250.0000 mg | Freq: Four times a day (QID) | ORAL | Status: DC
Start: 2013-11-19 — End: 2013-12-07

## 2013-11-19 MED ORDER — POTASSIUM CHLORIDE CRYS ER 15 MEQ PO TBCR: 30.0000 meq | EXTENDED_RELEASE_TABLET | Freq: Two times a day (BID) | ORAL | Status: DC

## 2013-11-19 MED ORDER — ENSURE COMPLETE PO LIQD: 237.0000 mL | Freq: Three times a day (TID) | ORAL | Status: DC

## 2013-11-19 MED ORDER — FUROSEMIDE 40 MG PO TABS: 40.0000 mg | ORAL_TABLET | Freq: Every day | ORAL | Status: DC

## 2013-11-19 NOTE — Discharge Summary (Signed)
Physician Discharge Summary  DAWNIELLE CHRISTIANA AVW:[id number] DOB: 11-Jan-1941 DOA: 11/14/2013  PCP: Hollace Kinnier, DO  Admit date: 11/14/2013 Discharge date: 11/19/2013  Time spent: 35 minutes  Recommendations for Outpatient Follow-up:  1. Please obtain a CBC and BMP in 3-4 days. Patient had been hypokalemic during this hospitalization 2. Obtain PT/INR checks biweekly on Tues and Thurs 3. Vanconycin 125 mg PO q daily, stop date: 12/01/2013  Discharge Diagnoses:  Principal Problem:   CVA (cerebral vascular accident) Active Problems:   Atrial fibrillation   Prosthetic valve endocarditis   CORONARY ARTERY DISEASE   CORONARY ARTERY BYPASS GRAFT, HX OF   Protein-calorie malnutrition, severe   Lupus   CKD (chronic kidney disease) stage 3, GFR 30-59 ml/min   Fever   Aphasia   Diarrhea  Acute on chronic Diastolic CHF  Discharge Condition: Stable/improved  Diet recommendation: Heart healthy diet  Filed Weights   11/14/13 2214 11/18/13 0328 11/19/13 0400  Weight: 78.427 kg (172 lb 14.4 oz) 78.159 kg (172 lb 5 oz) 78.2 kg (172 lb 6.4 oz)    History of present illness:  [name] is a 73 y.o. female with prior h/o recurrent endocarditis, on amoxicillin for endocarditis prophylaxis, atrial fibrillation on coumadin, CAD, pulmonary fibrosis, h/o c diff colitis, anemia, chronic diastolic heart failure, discharged yesterday, was brought by her daughter after she was found on the floor near the bed . Most of the history available from the daughters at bedside. The daughter who lives with the patient reports she heard a sound in the room and found her lying beside the bed. She was also found to be in sob, wheezing and gasping for air. Her oxygen sats were in low 80's. On arrival to ED, her rectal temp was 101, she was found to have some leukocytosis on lab work. Her CXR and UA did not reveal a source of infection. But she had two loose bm's since yesterday. No rash seen on the skin.    Hospital  Course:  73 y.o. female PMH of CAD s/p CABG, s/p AVR, h/o CVA, PAF on AC (coumadin), CHF, Pulmonary HTN, Pulmonary Fibrosis, chronic respiratory failure on home oxygen, Lupus, h/o recurrent endocarditis, on amoxicillin for endocarditis prophylaxis, discharged 8/21 was brought by her daughter after she was found on the floor near the bed with aphasia  -8/22 admitted with acute CVA, hypoxia, fever found to have C diff, CHF  1. Acute CVA; MRI showing a subcentimeter focus of acute infarct in the left frontal cortex  -Patient developed CVA while on coumadin with INR 2.6; ? 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 Terrence Dupont patient is likely to have vegetations but unfortunately not a candidate for surgery  -ID recommending oral suppressive therapy with amoxicillin   2. Acute on chronic Diastolic CHF with pulmonary HTN; echo (08/2013): LVEF 55%, AR, MR, TR, PA 81 mmHg  -Improved clinically  -Has a net negative fluid balance of 3.5 Liters  by 11/19/2048 -Continue Lasix 40 mg PO q daily   3. H/o recurrent endocarditis (enterococcal), Pt was on amoxicillin for endocarditis prophylaxis  -started on IV atx; blood cultures NGTD; previous cultures neg; per ID d/c IV atx, switched to PO Augmentin 8/24  -d/w cardiology regarding vegetation; Per Dr. Terrence Dupont patient is likely to have vegetations but unfortunately not a candidate for surgery   4. C diff +,  recurrent; started PO vanc; per ID: 14 days with oral vancomycin 125 mg qid; last dose 9/8   5. CAD s/p CABG, Pt denies acute chest pain; cont home regimen   6. PAF on AC (coumadin), cont amiodarone, BB, coumadin   7. Pulmonary Fibrosis, chronic respiratory failure on home oxygen; cont bronchodilators prn, oxygen     Procedures:  Transthoracic echocardiogram performed on  11/16/2013 showing ejection fraction of 60-65% without wall motion abnormalities.  Consultations:  Infectious disease  Discharge Exam: Filed Vitals:   11/19/13 0400  BP: 138/64  Pulse: 95  Temp: 97.9 F (36.6 C)  Resp: 20    General: Patient reports feeling better today, tolerating by mouth intake, states having greater movement to lower extremities Cardiovascular: Regular rate and rhythm normal S1-S2 Respiratory: Lungs are overall clear to auscultation, she has a few bibasal crackles otherwise normal respiratory effort, on supplemental oxygen Abdomen: Soft nontender nondistended positive bowel sounds  Discharge Instructions You were cared for by a hospitalist during your hospital stay. If you have any questions about your discharge medications or the care you received while you were in the hospital after you are discharged, you can call the unit and asked to speak with the hospitalist on call if the hospitalist that took care of you is not available. Once you are discharged, your primary care physician will handle any further medical issues. Please note that NO REFILLS for any discharge medications will be authorized once you are discharged, as it is imperative that you return to your primary care physician (or establish a relationship with a primary care physician if you do not have one) for your aftercare needs so that they can reassess your need for medications and monitor your lab values.      Discharge Instructions   Call MD for:  difficulty breathing, headache or visual disturbances    Complete by:  As directed      Call MD for:  extreme fatigue    Complete by:  As directed      Call MD for:  persistant dizziness or light-headedness    Complete by:  As directed      Call MD for:  persistant nausea and vomiting    Complete by:  As directed      Call MD for:  redness, tenderness, or signs of infection (pain, swelling, redness, odor or green/yellow discharge around incision  site)    Complete by:  As directed      Call MD for:  severe uncontrolled pain    Complete by:  As directed      Call MD for:  temperature >100.4    Complete by:  As directed      Diet - low sodium heart healthy    Complete by:  As directed      Increase activity slowly    Complete by:  As directed             Medication List    STOP taking these medications       buprenorphine 5 MCG/HR Ptwk patch  Commonly known as:  BUTRANS      TAKE these medications       amiodarone 200 MG tablet  Commonly known as:  PACERONE  Take 1 tablet (200 mg total) by mouth daily.     amoxicillin 500 MG capsule  Commonly known as:  AMOXIL  Take 1 capsule (500 mg total) by mouth every 12 (twelve) hours.     famotidine 20  MG tablet  Commonly known as:  PEPCID  Take 1 tablet (20 mg total) by mouth 2 (two) times daily.     feeding supplement (ENSURE COMPLETE) Liqd  Take 237 mLs by mouth 3 (three) times daily between meals.     furosemide 40 MG tablet  Commonly known as:  LASIX  Take 1 tablet (40 mg total) by mouth daily.     gabapentin 300 MG capsule  Commonly known as:  NEURONTIN  Take 1 capsule (300 mg total) by mouth at bedtime.     hydroxychloroquine 200 MG tablet  Commonly known as:  PLAQUENIL  Take 200 mg by mouth 2 (two) times daily.     IRON PO  Take 1 tablet by mouth 2 (two) times daily.     metoprolol tartrate 25 MG tablet  Commonly known as:  LOPRESSOR  Take 25 mg by mouth daily.     mirtazapine 7.5 MG tablet  Commonly known as:  REMERON  Take 1 tablet (7.5 mg total) by mouth at bedtime. For appetite     potassium chloride SA 15 MEQ tablet  Commonly known as:  KLOR-CON M15  Take 2 tablets (30 mEq total) by mouth 2 (two) times daily.     rosuvastatin 10 MG tablet  Commonly known as:  CRESTOR  Take 10 mg by mouth every morning.     saccharomyces boulardii 250 MG capsule  Commonly known as:  FLORASTOR  Take 1 capsule (250 mg total) by mouth 2 (two) times daily.      vancomycin 50 mg/mL oral solution  Commonly known as:  VANCOCIN  Take 5 mLs (250 mg total) by mouth every 6 (six) hours.     warfarin 4 MG tablet  Commonly known as:  COUMADIN  Take 4 mg by mouth daily.       Allergies  Allergen Reactions  . Ceftriaxone     RASH BUT TOLERATED AMPICILLIN AND IMIPENEM WITHOUT PROBLEMS  . Codeine Nausea Only   Follow-up Information   Follow up with [facility]. (Registered Nurse, Physical and Occupational Therapy. )    Contact information:   [address] [place] [address] [phone]        The results of significant diagnostics from this hospitalization (including imaging, microbiology, ancillary and laboratory) are listed below for reference.    Significant Diagnostic Studies: Dg Chest 2 View  11/15/2013   CLINICAL DATA:  Suspect pneumonia; history of CHF  EXAM: CHEST  2 VIEW  COMPARISON:  .  Portable chest x-ray of November 14, 2013  FINDINGS: The right lung is less well inflated today. The interstitial markings are increased bilaterally. The cardiopericardial silhouette is enlarged. The pulmonary vascularity is indistinct. There is dense calcification in the mitral valvular annulus. A trace of pleural fluid blunts the costophrenic angles bilaterally. The patient has undergone previous median sternotomy. The bony thorax is unremarkable on the frontal film. On the lateral film the thoracic spine cannot be adequately assessed.  IMPRESSION: Mildly increased interstitial markings bilaterally are slightly more conspicuous today. This may reflect worsening of CHF. No discrete alveolar pneumonia is demonstrated.   Electronically Signed   By: David  Martinique   On: 11/15/2013 13:54   Ct Head Wo Contrast  11/14/2013   CLINICAL DATA:  73 year old female code stroke. Unresponsive with mental status change. Initial encounter.  EXAM: CT HEAD WITHOUT CONTRAST  TECHNIQUE: Contiguous axial images were obtained from the base of the skull  through the vertex without  intravenous contrast.  COMPARISON:  Head and cervical spine CT 1039 hr the same day and earlier.  FINDINGS: Stable paranasal sinuses and mastoids. No acute osseous abnormality identified. Stable orbit and scalp soft tissues.  Calcified atherosclerosis at the skull base. 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 Left thalamic and right caudate lacunar infarcts also are stable. Patchy and confluent cerebral white matter hypodensity also has not significantly changed. Scattered small lacunar infarcts in both cerebellar hemispheres also appear stable. No midline shift, mass effect, or evidence of intracranial mass lesion. No acute intracranial hemorrhage identified. No evidence of cortically based acute infarction identified. No suspicious intracranial vascular hyperdensity.  IMPRESSION: 1. Stable non contrast CT appearance of the brain. No new intracranial abnormality identified. 2. Chronic ischemic disease detailed above. Study discussed by telephone with Dr. Wallie Char on 11/14/2013 at 1718 hrs.   Electronically Signed   By: Lars Pinks M.D.   On: 11/14/2013 17:22   Ct Head Wo Contrast  11/14/2013   CLINICAL DATA:  Fall earlier today. Now altered mental status and lethargy. Patient on Coumadin.  EXAM: CT HEAD WITHOUT CONTRAST  CT CERVICAL SPINE WITHOUT CONTRAST  TECHNIQUE: Multidetector CT imaging of the head and cervical spine was performed following the standard protocol without intravenous contrast. Multiplanar CT image reconstructions of the cervical spine were also generated.  COMPARISON:  Prior CT scan of the head and cervical spine 06/27/2013  FINDINGS: CT HEAD FINDINGS  Negative for acute intracranial hemorrhage, acute infarction, mass, mass effect, hydrocephalus or midline shift. Gray-white differentiation is preserved throughout. A stable appearance of atrophy, prior right parietal infarct, prior right occipital infarct and  extensive sequelae of longstanding chronic microvascular ischemic white matter disease. Stable ventricular configuration. No acute scalp hematoma or calvarial fracture. Normal aeration of the mastoid air cells. Mild mucoperiosteal thickening in the inferior aspect of the maxillary sinuses. Atherosclerosis in the bilateral cavernous carotid arteries.  CT CERVICAL SPINE FINDINGS  No acute fracture, malalignment or prevertebral soft tissue swelling. Multilevel degenerative disc disease. Disc space narrowing most prominent at C5-C6. Multilevel facet arthropathy on the right at C3-C4 and C4-C5. The is Unremarkable CT appearance of the thyroid gland. No acute soft tissue abnormality. The lung apices are unremarkable.  IMPRESSION: CT HEAD  1. No acute intracranial abnormality. 2. Stable atrophy, remote infarcts involving the right parietal and right occipital regions and advanced chronic microvascular ischemic white matter disease. CT CSPINE  1. No acute fracture or malalignment. 2. Multilevel cervical spondylosis and right-sided facet arthropathy.   Electronically Signed   By: Jacqulynn Cadet M.D.   On: 11/14/2013 11:34   Ct Cervical Spine Wo Contrast  11/14/2013   CLINICAL DATA:  Fall earlier today. Now altered mental status and lethargy. Patient on Coumadin.  EXAM: CT HEAD WITHOUT CONTRAST  CT CERVICAL SPINE WITHOUT CONTRAST  TECHNIQUE: Multidetector CT imaging of the head and cervical spine was performed following the standard protocol without intravenous contrast. Multiplanar CT image reconstructions of the cervical spine were also generated.  COMPARISON:  Prior CT scan of the head and cervical spine 06/27/2013  FINDINGS: CT HEAD FINDINGS  Negative for acute intracranial hemorrhage, acute infarction, mass, mass effect, hydrocephalus or midline shift. Gray-white differentiation is preserved throughout. A stable appearance of atrophy, prior right parietal infarct, prior right occipital infarct and extensive  sequelae of longstanding chronic microvascular ischemic white matter disease. Stable ventricular configuration. No acute scalp hematoma or calvarial fracture. Normal aeration of the mastoid  air cells. Mild mucoperiosteal thickening in the inferior aspect of the maxillary sinuses. Atherosclerosis in the bilateral cavernous carotid arteries.  CT CERVICAL SPINE FINDINGS  No acute fracture, malalignment or prevertebral soft tissue swelling. Multilevel degenerative disc disease. Disc space narrowing most prominent at C5-C6. Multilevel facet arthropathy on the right at C3-C4 and C4-C5. The is Unremarkable CT appearance of the thyroid gland. No acute soft tissue abnormality. The lung apices are unremarkable.  IMPRESSION: CT HEAD  1. No acute intracranial abnormality. 2. Stable atrophy, remote infarcts involving the right parietal and right occipital regions and advanced chronic microvascular ischemic white matter disease. CT CSPINE  1. No acute fracture or malalignment. 2. Multilevel cervical spondylosis and right-sided facet arthropathy.   Electronically Signed   By: Jacqulynn Cadet M.D.   On: 11/14/2013 11:34   Mr Jodene Nam Head Wo Contrast  11/14/2013   CLINICAL DATA:  Stroke  EXAM: MRI HEAD WITHOUT AND WITH CONTRAST  MRA HEAD WITHOUT CONTRAST  MRA NECK WITHOUT AND WITH CONTRAST  TECHNIQUE: Multiplanar, multiecho pulse sequences of the brain and surrounding structures were obtained without and with intravenous contrast. Angiographic images of the Circle of Willis were obtained using MRA technique without intravenous contrast. Angiographic images of the neck were obtained using MRA technique without and with intravenous contrast. Carotid stenosis measurements (when applicable) are obtained utilizing NASCET criteria, using the distal internal carotid diameter as the denominator.  CONTRAST:  80mL MULTIHANCE GADOBENATE DIMEGLUMINE 529 MG/ML IV SOLN  COMPARISON:  CT head 11/14/2013  FINDINGS: MRI HEAD FINDINGS  Small area of  acute infarct in the left frontal cortex above the sylvian fissure. No other acute infarct identified.  Generalized atrophy. Chronic ischemic changes throughout the white matter. 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 Mild chronic ischemia in the cerebellum bilaterally. Chronic infarct in the left thalamus. Scattered areas of chronic micro hemorrhage bilaterally most likely due to chronic hypertension  Postcontrast imaging reveals no enhancing mass lesion.  MRA HEAD FINDINGS  Both vertebral arteries are patent to the basilar. Right AICA is patent with proximal stenosis. Left PICA patent. Basilar patent. Superior cerebellar arteries show mild disease proximally. Posterior cerebral arteries are patent bilaterally with mild atherosclerotic disease. No significant stenosis  Internal carotid artery widely patent bilaterally. Anterior and middle cerebral arteries are patent bilaterally. Moderate disease in right middle cerebral artery branches.  Negative for cerebral aneurysm.  MRA NECK FINDINGS  Suboptimal image quality.  Suboptimal timing of the injection.  The internal carotid artery appears patent without significant stenosis bilaterally. Both vertebral arteries are patent without significant stenosis.  IMPRESSION: Subcentimeter focus of acute infarct in the left frontal cortex.  Mild intracranial atherosclerotic disease most prominent in the right middle cerebral artery.  Suboptimal MRA of the neck without significant carotid or vertebral stenosis in the neck.   Electronically Signed   By: Franchot Gallo M.D.   On: 11/14/2013 20:26   Mr Angiogram Neck W Wo Contrast  11/14/2013   CLINICAL DATA:  Stroke  EXAM: MRI HEAD WITHOUT AND WITH CONTRAST  MRA HEAD WITHOUT CONTRAST  MRA NECK WITHOUT AND WITH CONTRAST  TECHNIQUE: Multiplanar, multiecho pulse sequences of the brain and surrounding structures were obtained without and with intravenous contrast. Angiographic images of the Circle of Willis were obtained  using MRA technique without intravenous contrast. Angiographic images of the neck were obtained using MRA technique without and with intravenous contrast. Carotid stenosis measurements (when applicable) are obtained utilizing NASCET criteria, using the distal internal carotid diameter  as the denominator.  CONTRAST:  60mL MULTIHANCE GADOBENATE DIMEGLUMINE 529 MG/ML IV SOLN  COMPARISON:  CT head 11/14/2013  FINDINGS: MRI HEAD FINDINGS  Small area of acute infarct in the left frontal cortex above the sylvian fissure. No other acute infarct identified.  Generalized atrophy. Chronic ischemic changes throughout the white matter. 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 Mild chronic ischemia in the cerebellum bilaterally. Chronic infarct in the left thalamus. Scattered areas of chronic micro hemorrhage bilaterally most likely due to chronic hypertension  Postcontrast imaging reveals no enhancing mass lesion.  MRA HEAD FINDINGS  Both vertebral arteries are patent to the basilar. Right AICA is patent with proximal stenosis. Left PICA patent. Basilar patent. Superior cerebellar arteries show mild disease proximally. Posterior cerebral arteries are patent bilaterally with mild atherosclerotic disease. No significant stenosis  Internal carotid artery widely patent bilaterally. Anterior and middle cerebral arteries are patent bilaterally. Moderate disease in right middle cerebral artery branches.  Negative for cerebral aneurysm.  MRA NECK FINDINGS  Suboptimal image quality.  Suboptimal timing of the injection.  The internal carotid artery appears patent without significant stenosis bilaterally. Both vertebral arteries are patent without significant stenosis.  IMPRESSION: Subcentimeter focus of acute infarct in the left frontal cortex.  Mild intracranial atherosclerotic disease most prominent in the right middle cerebral artery.  Suboptimal MRA of the neck without significant carotid or vertebral stenosis in the neck.    Electronically Signed   By: Franchot Gallo M.D.   On: 11/14/2013 20:26   Mr Jeri Cos PI Contrast  11/14/2013   CLINICAL DATA:  Stroke  EXAM: MRI HEAD WITHOUT AND WITH CONTRAST  MRA HEAD WITHOUT CONTRAST  MRA NECK WITHOUT AND WITH CONTRAST  TECHNIQUE: Multiplanar, multiecho pulse sequences of the brain and surrounding structures were obtained without and with intravenous contrast. Angiographic images of the Circle of Willis were obtained using MRA technique without intravenous contrast. Angiographic images of the neck were obtained using MRA technique without and with intravenous contrast. Carotid stenosis measurements (when applicable) are obtained utilizing NASCET criteria, using the distal internal carotid diameter as the denominator.  CONTRAST:  66mL MULTIHANCE GADOBENATE DIMEGLUMINE 529 MG/ML IV SOLN  COMPARISON:  CT head 11/14/2013  FINDINGS: MRI HEAD FINDINGS  Small area of acute infarct in the left frontal cortex above the sylvian fissure. No other acute infarct identified.  Generalized atrophy. Chronic ischemic changes throughout the white matter. 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 Mild chronic ischemia in the cerebellum bilaterally. Chronic infarct in the left thalamus. Scattered areas of chronic micro hemorrhage bilaterally most likely due to chronic hypertension  Postcontrast imaging reveals no enhancing mass lesion.  MRA HEAD FINDINGS  Both vertebral arteries are patent to the basilar. Right AICA is patent with proximal stenosis. Left PICA patent. Basilar patent. Superior cerebellar arteries show mild disease proximally. Posterior cerebral arteries are patent bilaterally with mild atherosclerotic disease. No significant stenosis  Internal carotid artery widely patent bilaterally. Anterior and middle cerebral arteries are patent bilaterally. Moderate disease in right middle cerebral artery branches.  Negative for cerebral aneurysm.  MRA NECK FINDINGS  Suboptimal image quality.  Suboptimal  timing of the injection.  The internal carotid artery appears patent without significant stenosis bilaterally. Both vertebral arteries are patent without significant stenosis.  IMPRESSION: Subcentimeter focus of acute infarct in the left frontal cortex.  Mild intracranial atherosclerotic disease most prominent in the right middle cerebral artery.  Suboptimal MRA of the neck without significant carotid or vertebral stenosis in  the neck.   Electronically Signed   By: Franchot Gallo M.D.   On: 11/14/2013 20:26   US Abdomen Complete  11/07/2013   CLINICAL DATA:  Severe sepsis.  Elevated LFTs.  EXAM: ULTRASOUND ABDOMEN COMPLETE  COMPARISON:  CT abdomen and pelvis from 08/03/2013, and renal ultrasound performed 02/13/2012  FINDINGS: Gallbladder:  Contracted and not well assessed. A 2.0 cm stone is noted within the gallbladder. This appearance is grossly unchanged from the prior CT. No ultrasonographic Murphy's sign is elicited.  Common bile duct:  Diameter: 0.4 cm, within normal limits in caliber.  Liver:  No focal lesion identified. Diffuse soft tissue thickening about the porta hepatis is thought to reflect periportal edema, also noted on prior CT. Within normal limits in parenchymal echogenicity. Trace ascites is noted about the liver.  IVC:  Not fully characterized, but grossly unremarkable in appearance.  Pancreas:  The visualized portions of the pancreas are grossly unremarkable, though the pancreas is difficult to fully assess.  Spleen:  Size and appearance within normal limits.  Right Kidney:  Length: 13.1 cm. Echogenicity within normal limits. Trace perinephric fluid is noted. No hydronephrosis visualized. A 2.3 cm cyst is noted at the lower pole of the right kidney.  Left Kidney:  Length: 12.7 cm. Echogenicity within normal limits. Trace perinephric fluid is noted. No hydronephrosis visualized. A 0.9 cm cyst is noted at the upper pole of the left kidney.  Abdominal aorta:  No aneurysm visualized. Not fully  characterized due to overlying bowel gas.  Other findings:  None.  IMPRESSION: 1. No acute abnormality seen within the abdomen. Evaluation is mildly suboptimal due to bowel gas. 2. Gallbladder contracted and not well assessed, with a 2.0 cm stone in the gallbladder. This appearance is stable from the prior CT. No ultrasonographic Murphy's sign elicited. No evidence for acute cholecystitis or obstruction. 3. Periportal edema again noted. 4. Trace perinephric fluid noted bilaterally, mildly more prominent than on the prior CT and of uncertain significance. Small bilateral renal cysts seen. 5. Trace ascites noted about the liver.   Electronically Signed   By: Garald Balding M.D.   On: 11/07/2013 21:23   Dg Chest Port 1 View  11/16/2013   CLINICAL DATA:  PICC line placement.  EXAM: PORTABLE CHEST - 1 VIEW  COMPARISON:  None.  FINDINGS: The heart is enlarged but stable. Persistent edema and atelectasis. The right PICC line tip is located just below the level of the carina, approximately 4 cm above the cavoatrial junction. No pleural effusion or pneumothorax.  IMPRESSION: The right PICC line tip is in the mid SVC.   Electronically Signed   By: Kalman Jewels M.D.   On: 11/16/2013 13:03   Dg Chest Portable 1 View  11/14/2013   CLINICAL DATA:  Altered mental status  EXAM: PORTABLE CHEST - 1 VIEW  COMPARISON:  Prior chest x-ray 11/07/2013  FINDINGS: Stable cardiomegaly. Patient is status post median sternotomy with evidence of prior CABG. Patient is status post median sternotomy with evidence of prior CABG. Atherosclerotic calcifications present within the transverse aorta. Is slightly lower inspiratory volumes.a increased pulmonary vascular congestion now with mild interstitial edema. Kerley B-lines are noted in the periphery of the right lung. No focal airspace consolidation, pleural effusion or pneumothorax. No acute osseous abnormality.  IMPRESSION: 1. Interval development of mild interstitial edema concerning for  mild CHF.   Electronically Signed   By: Jacqulynn Cadet M.D.   On: 11/14/2013 10:20   Dg Chest Portable  1 View  11/07/2013   CLINICAL DATA:  Generalized weakness  EXAM: PORTABLE CHEST - 1 VIEW  COMPARISON:  09/12/2013  FINDINGS: Stable changes from previous cardiac surgery. Cardiac silhouette is enlarged. No mediastinal or hilar masses.  Mild stable interstitial thickening. Lungs are otherwise clear. No pleural effusion or pneumothorax.  Bony thorax is demineralized but grossly intact.  IMPRESSION: No acute cardiopulmonary disease.   Electronically Signed   By: Lajean Manes M.D.   On: 11/07/2013 09:29    Microbiology: Recent Results (from the past 240 hour(s))  CULTURE, BLOOD (ROUTINE X 2)     Status: None   Collection Time    11/14/13  9:48 AM      Result Value Ref Range Status   Specimen Description BLOOD LEFT HAND   Final   Special Requests BOTTLES DRAWN AEROBIC ONLY 5ML   Final   Culture  Setup Time     Final   Value: 11/14/2013 20:00     Performed at [facility]   Culture     Final   Value:        BLOOD CULTURE RECEIVED NO GROWTH TO DATE CULTURE WILL BE HELD FOR 5 DAYS BEFORE ISSUING A FINAL NEGATIVE REPORT     Performed at [facility]   Report Status PENDING   Incomplete  CULTURE, BLOOD (ROUTINE X 2)     Status: None   Collection Time    11/14/13 11:45 AM      Result Value Ref Range Status   Specimen Description BLOOD RIGHT HAND   Final   Special Requests BOTTLES DRAWN AEROBIC AND ANAEROBIC B 5CC R 4CC   Final   Culture  Setup Time     Final   Value: 11/15/2013 00:09     Performed at [facility]   Culture     Final   Value:        BLOOD CULTURE RECEIVED NO GROWTH TO DATE CULTURE WILL BE HELD FOR 5 DAYS BEFORE ISSUING A FINAL NEGATIVE REPORT     Performed at [facility]   Report Status PENDING   Incomplete  CLOSTRIDIUM DIFFICILE BY PCR     Status: Abnormal   Collection Time    11/15/13  2:34 PM      Result Value Ref Range Status   C  difficile by pcr POSITIVE (*) NEGATIVE Final   Comment: CRITICAL RESULT CALLED TO, READ BACK BY AND VERIFIED WITH:     LATOYA SILVA 1615 BY Wheeler 11/15/13     Labs: Basic Metabolic Panel:  Recent Labs Lab 11/14/13 0923 11/14/13 1301 11/15/13 0340 11/17/13 0435 11/18/13 0517  NA 139 141 140 139 142  K 5.7* 4.8 3.8 3.3* 3.3*  CL 106 108 108 101 104  CO2 15* 15* 18* 25 30  GLUCOSE 76 74 89 107* 116*  BUN 40* 42* 42* 19 15  CREATININE 1.08 1.15* 1.19* 0.90 0.81  CALCIUM 9.3 9.1 8.4 8.4 8.0*   Liver Function Tests:  Recent Labs Lab 11/14/13 0923  AST 54*  ALT 308*  ALKPHOS 144*  BILITOT 2.3*  PROT 7.1  ALBUMIN 3.3*   No results found for this basename: LIPASE, AMYLASE,  in the last 168 hours No results found for this basename: AMMONIA,  in the last 168 hours CBC:  Recent Labs Lab 11/14/13 0923 11/15/13 0340 11/17/13 0435  WBC 14.9* 6.8 9.1  NEUTROABS 11.7*  --   --   HGB 12.5 10.3* 12.6  HCT 39.3 30.4* 38.4  MCV 97.0 97.4 99.5  PLT 145* 102* 108*   Cardiac Enzymes:  Recent Labs Lab 11/14/13 0923  TROPONINI <0.30   BNP: BNP (last 3 results)  Recent Labs  09/12/13 1122 11/07/13 1000 11/14/13 0923  PROBNP 16828.0* 24587.0* 13807.0*   CBG: No results found for this basename: GLUCAP,  in the last 168 hours     Signed:  Kelvin Cellar  [facility] 11/19/2013, 12:10 PM

## 2013-11-19 NOTE — Progress Notes (Signed)
ANTICOAGULATION CONSULT NOTE - Follow Up Consult  Pharmacy Consult for warfarin Indication: atrial fibrillation  Allergies  Allergen Reactions  . Ceftriaxone     RASH BUT TOLERATED AMPICILLIN AND IMIPENEM WITHOUT PROBLEMS  . Codeine Nausea Only    Labs:  Recent Labs  11/17/13 0435 11/18/13 0517 11/19/13 0500  HGB 12.6  --   --   HCT 38.4  --   --   PLT 108*  --   --   LABPROT 26.1* 24.8* 22.7*  INR 2.39* 2.24* 2.00*  CREATININE 0.90 0.81  --     Estimated Creatinine Clearance: 63.4 ml/min (by C-G formula based on Cr of 0.81).  Assessment: 44 yof admitted after a fall at home, who is Cdiff positive (vanc po) and now off broad spectrum antibiotics. She continues on chronic coumadin.  INR = 2   Goal of Therapy:  INR 2-3   Plan:  1. Warfarin 6 mg po x 1 tonight then warfarin 4 mg PO daily 2. F/u AM INR  Thank you. Anette Guarneri, PharmD [phone]  11/19/2013,8:55 AM

## 2013-11-19 NOTE — Progress Notes (Signed)
PT Cancellation Note  Patient Details Name: ZIYON CEDOTAL MRN: [id number] DOB: 12/09/1940   Cancelled Treatment:    Reason Eval/Treat Not Completed: Patient declined, pt stated she's saving her energy for discharging.    Blondell Reveal Kistler 11/19/2013, 2:39 PM [phone]

## 2013-11-19 NOTE — Progress Notes (Signed)
CSW (Clinical Education officer, museum) prepared pt dc packet and placed with shadow chart. CSW arranged non-emergent ambulance transport for 2:30pm. Pt, pt family, pt nurse, and facility informed. CSW signing off.  [place], [place]

## 2013-11-20 ENCOUNTER — Ambulatory Visit: Payer: Medicare Other | Admitting: Internal Medicine

## 2013-11-20 LAB — CULTURE, BLOOD (ROUTINE X 2): Culture: NO GROWTH

## 2013-11-21 LAB — CULTURE, BLOOD (ROUTINE X 2): Culture: NO GROWTH

## 2013-11-23 ENCOUNTER — Encounter: Payer: Self-pay | Admitting: Internal Medicine

## 2013-11-23 ENCOUNTER — Non-Acute Institutional Stay (SKILLED_NURSING_FACILITY): Payer: Medicare Other | Admitting: Internal Medicine

## 2013-11-23 DIAGNOSIS — J9611 Chronic respiratory failure with hypoxia: Secondary | ICD-10-CM | POA: Insufficient documentation

## 2013-11-23 DIAGNOSIS — I38 Endocarditis, valve unspecified: Secondary | ICD-10-CM

## 2013-11-23 DIAGNOSIS — T827XXA Infection and inflammatory reaction due to other cardiac and vascular devices, implants and grafts, initial encounter: Secondary | ICD-10-CM

## 2013-11-23 DIAGNOSIS — N183 Chronic kidney disease, stage 3 unspecified: Secondary | ICD-10-CM

## 2013-11-23 DIAGNOSIS — A0472 Enterocolitis due to Clostridium difficile, not specified as recurrent: Secondary | ICD-10-CM

## 2013-11-23 DIAGNOSIS — I480 Paroxysmal atrial fibrillation: Secondary | ICD-10-CM

## 2013-11-23 DIAGNOSIS — I6350 Cerebral infarction due to unspecified occlusion or stenosis of unspecified cerebral artery: Secondary | ICD-10-CM

## 2013-11-23 DIAGNOSIS — J9610 Chronic respiratory failure, unspecified whether with hypoxia or hypercapnia: Secondary | ICD-10-CM

## 2013-11-23 DIAGNOSIS — I2510 Atherosclerotic heart disease of native coronary artery without angina pectoris: Secondary | ICD-10-CM

## 2013-11-23 DIAGNOSIS — E785 Hyperlipidemia, unspecified: Secondary | ICD-10-CM

## 2013-11-23 DIAGNOSIS — I639 Cerebral infarction, unspecified: Secondary | ICD-10-CM

## 2013-11-23 DIAGNOSIS — I4891 Unspecified atrial fibrillation: Secondary | ICD-10-CM

## 2013-11-23 DIAGNOSIS — T826XXD Infection and inflammatory reaction due to cardiac valve prosthesis, subsequent encounter: Secondary | ICD-10-CM

## 2013-11-23 DIAGNOSIS — Z5189 Encounter for other specified aftercare: Secondary | ICD-10-CM

## 2013-11-23 DIAGNOSIS — R4701 Aphasia: Secondary | ICD-10-CM

## 2013-11-23 DIAGNOSIS — I5023 Acute on chronic systolic (congestive) heart failure: Secondary | ICD-10-CM

## 2013-11-23 DIAGNOSIS — R0902 Hypoxemia: Secondary | ICD-10-CM

## 2013-11-23 NOTE — Assessment & Plan Note (Signed)
Cr 0.9 to 1.19 this hosp

## 2013-11-23 NOTE — Assessment & Plan Note (Signed)
with pulmonary HTN; echo (08/2013): LVEF 55%, AR, MR, TR, PA 81 mmHg  -Improved clinically  -Has a net negative fluid balance of 3.5 Liters by 11/19/2048  -Continue Lasix 40 mg PO q daily

## 2013-11-23 NOTE — Assessment & Plan Note (Signed)
LDL 42, HDL 38 on crestor 10 mg

## 2013-11-23 NOTE — Assessment & Plan Note (Signed)
MRI showing a subcentimeter focus of acute infarct in the left frontal cortex  -Patient developed CVA while on coumadin with INR 2.6; ? 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 Terrence Dupont patient is likely to have vegetations but unfortunately not a candidate for surgery  -ID recommending oral suppressive therapy with amoxicillin

## 2013-11-23 NOTE — Assessment & Plan Note (Signed)
PAF on AC (coumadin), cont amiodarone, BB, coumadin

## 2013-11-23 NOTE — Assessment & Plan Note (Signed)
2/2 Pulmonary Fibrosis,  on home oxygen; cont bronchodilators prn, oxygen

## 2013-11-23 NOTE — Assessment & Plan Note (Signed)
recurrent; started PO vanc; per ID: 14 days with oral vancomycin 125 mg qid; last dose 9/8

## 2013-11-23 NOTE — Assessment & Plan Note (Signed)
s/p CABG, Pt denies acute chest pain; cont home regimen

## 2013-11-23 NOTE — Assessment & Plan Note (Signed)
H/o recurrent endocarditis (enterococcal), Pt was on amoxicillin for endocarditis prophylaxis  -started on IV atx; blood cultures NGTD; previous cultures neg; per ID d/c IV atx, switched to PO Augmentin 8/24  -d/w cardiology regarding vegetation; Per Dr. Terrence Dupont patient is likely to have vegetations but unfortunately not a candidate for surgery

## 2013-11-23 NOTE — Assessment & Plan Note (Signed)
Was presenting sx and much improved

## 2013-11-23 NOTE — Progress Notes (Signed)
MRN: [id number] Name: [name]  Sex: female Age: 73 y.o. DOB: 05/18/1940  [facility] Facility/Room:213A Level Of Care: SNF Provider: Inocencio Homes D Emergency Contacts: Extended Emergency Contact Information Primary Emergency Contact: Johnson,Penny Address: [address]          [place], [place] [address] [place] Phone: [phone] Mobile Phone: [phone] Relation: Daughter Secondary Emergency Contact: Viscomi,Robin Address: [place], [place] [address] [place] Phone: [phone] Work Phone: [phone] Mobile Phone: [phone] Relation: Daughter  Code Status: FULL  Allergies: Ceftriaxone and Codeine  Chief Complaint  Patient presents with  . New Admit To SNF    HPI: Patient is 73 y.o. female who is admitted to SNF s/p CVA.  Past Medical History  Diagnosis Date  . Coronary artery disease   . Atrial fibrillation     Now NSR  . GERD (gastroesophageal reflux disease)   . Hyperlipidemia   . 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 Anemia   . Angina   . Hypertension   . Blood transfusion     "related to bad case of bronchitis once; 1 yr ago given due to GIB" (12/17/2012)  . H/O hiatal hernia   . Lupus     "that's compromised her lungs somewhat" (12/17/2012)  . Insomnia   . Endocarditis   . Prosthetic valve endocarditis   . Enterococcal bacteremia   . Heart murmur   . CHF (congestive heart failure)   . Pneumonia     "once or twice" (12/17/2012)  . Chronic bronchitis     "used to get it once/yr; hasn't had it since ~ 1986" (12/17/2012)  . Shortness of breath     "all the times sometimes" (12/17/2012)  . On home oxygen therapy     "2L prn; always at night" (12/17/2012)  . Lower GI bleed ~ 01/2012  . Arthritis     "hands" (12/17/2012)  . 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 Right bundle branch block 11/07/2013    Past Surgical  History  Procedure Laterality Date  . Givens capsule study  04/09/2011    Procedure: GIVENS CAPSULE STUDY;  Surgeon: Beryle Beams, MD;  Location: [facility];  Service: Endoscopy;  Laterality: N/A;  . Tee without cardioversion  02/06/2012    Procedure: TRANSESOPHAGEAL ECHOCARDIOGRAM (TEE);  Surgeon: Birdie Riddle, MD;  Location: [facility] ENDOSCOPY;  Service: Cardiovascular;  Laterality: N/A;  . Esophagogastroduodenoscopy  02/12/2012    Procedure: ESOPHAGOGASTRODUODENOSCOPY (EGD);  Surgeon: Beryle Beams, MD;  Location: [facility] ENDOSCOPY;  Service: Endoscopy;  Laterality: N/A;  . Tee without cardioversion N/A 07/15/2012    Procedure: TRANSESOPHAGEAL ECHOCARDIOGRAM (TEE);  Surgeon: Birdie Riddle, MD;  Location: [facility] ENDOSCOPY;  Service: Cardiovascular;  Laterality: N/A;  . Breast lumpectomy Left 1977    "benign" (12/17/2012)  . Tonsillectomy  1940's  . Coronary artery bypass graft  2006  . 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   . Cardiac catheterization      "probably 2-3" (12/17/2012)  . Coronary angioplasty with stent placement      "several put in over the years" (12/17/2012)  . Cataract extraction w/ intraocular lens  implant, bilateral Bilateral 11/2000  . Peripherally inserted central catheter insertion Right 12/15/2012    "upper arm" (12/17/2012      Medication List  This list is accurate as of: 11/23/13  9:17 PM.  Always use your most recent med list.               amiodarone 200 MG tablet  Commonly known as:  PACERONE  Take 1 tablet (200 mg total) by mouth daily.     amoxicillin 500 MG capsule  Commonly known as:  AMOXIL  Take 1 capsule (500 mg total) by mouth every 12 (twelve) hours.     famotidine 20 MG tablet  Commonly known as:  PEPCID  Take 1 tablet (20 mg total) by mouth 2 (two) times daily.     feeding supplement (ENSURE COMPLETE) Liqd  Take 237 mLs by mouth 3 (three) times daily between meals.     furosemide 40 MG tablet   Commonly known as:  LASIX  Take 1 tablet (40 mg total) by mouth daily.     gabapentin 300 MG capsule  Commonly known as:  NEURONTIN  Take 1 capsule (300 mg total) by mouth at bedtime.     hydroxychloroquine 200 MG tablet  Commonly known as:  PLAQUENIL  Take 200 mg by mouth 2 (two) times daily.     IRON PO  Take 1 tablet by mouth 2 (two) times daily.     metoprolol tartrate 25 MG tablet  Commonly known as:  LOPRESSOR  Take 25 mg by mouth daily.     mirtazapine 7.5 MG tablet  Commonly known as:  REMERON  Take 1 tablet (7.5 mg total) by mouth at bedtime. For appetite     potassium chloride SA 15 MEQ tablet  Commonly known as:  KLOR-CON M15  Take 2 tablets (30 mEq total) by mouth 2 (two) times daily.     rosuvastatin 10 MG tablet  Commonly known as:  CRESTOR  Take 10 mg by mouth every morning.     saccharomyces boulardii 250 MG capsule  Commonly known as:  FLORASTOR  Take 1 capsule (250 mg total) by mouth 2 (two) times daily.     vancomycin 50 mg/mL oral solution  Commonly known as:  VANCOCIN  Take 5 mLs (250 mg total) by mouth every 6 (six) hours.     warfarin 4 MG tablet  Commonly known as:  COUMADIN  Take 4 mg by mouth daily.        No orders of the defined types were placed in this encounter.    Immunization History  Administered Date(s) Administered  . Influenza Whole 01/31/2006, 01/09/2008, 01/31/2009, 12/13/2009, 02/24/2011  . Influenza,inj,Quad PF,36+ Mos 12/13/2012  . Pneumococcal Polysaccharide-23 01/31/2009  . Tdap 06/27/2013    History  Substance Use Topics  . Smoking status: Never Smoker   . Smokeless tobacco: Never Used  . Alcohol Use: No    Family history is noncontributory    Review of Systems  DATA OBTAINED: from patient GENERAL: no fever SKIN: No itching, rash  EYES: No eye pain, redness, discharge EARS: No earache, tinnitus, change in hearing NOSE: No congestion, drainage or bleeding  MOUTH/THROAT: No mouth or tooth pain,   RESPIRATORY: No cough, wheezing, SOB CARDIAC: No chest pain, palpitations, lower extremity edema  GI: No abdominal pain, No N/V/D or constipation, No heartburn or reflux  GU: No dysuria, frequency or urgency, or incontinence  MUSCULOSKELETAL: No unrelieved bone/joint pain NEUROLOGIC: No headache, dizziness PSYCHIATRIC: No overt anxiety or sadness. Sleeps well. No behavior issue.   Filed Vitals:   11/23/13 1641  BP: 120/50  Pulse: 78  Temp: 96.6 F (  35.9 C)  Resp: 18    Physical Exam  GENERAL APPEARANCE: Alert, mod conversant. Appropriately groomed. No acute distress.  SKIN: No diaphoresis rash, or wounds HEAD: Normocephalic, atraumatic  EYES: Conjunctiva/lids clear. Pupils round, reactive. EOMs intact.  EARS: External exam WNL, canals clear. Hearing grossly normal.  NOSE: No deformity or discharge.  MOUTH/THROAT: Lips w/o lesions.   RESPIRATORY: Breathing is even, unlabored. Lung sounds are clear   CARDIOVASCULAR: Heart irreg no murmurs, rubs or gallops. 1+ peripheral edema.   GASTROINTESTINAL: Abdomen is soft, non-tender, not distended w/ normal bowel sounds GENITOURINARY: Bladder non tender, not distended  MUSCULOSKELETAL: No abnormal joints or musculature NEUROLOGIC:  Cranial nerves 2-12 grossly intact; speaks slowly but is understandable;Moves all extremities no tremor. PSYCHIATRIC: Mood and affect appropriate to situation, no behavioral issues  Patient Active Problem List   Diagnosis Date Noted  . Acute on chronic systolic heart failure 42/59/5638  . Enteritis due to Clostridium difficile 11/23/2013  . Chronic respiratory failure with hypoxia 11/23/2013  . Diarrhea 11/15/2013  . Fever 11/14/2013  . CVA (cerebral vascular accident) 11/14/2013  . Aphasia 11/14/2013  . Respiratory distress 11/07/2013  . Severe sepsis 11/07/2013  . Chronic systolic heart failure 75/64/3329  . History of Clostridium difficile colitis 09/12/2013  . Low back pain 07/03/2013  . Failure  to thrive in adult 07/03/2013  . Severe low back pain 06/01/2013  . CKD (chronic kidney disease) stage 3, GFR 30-59 ml/min 02/02/2013  . Lupus 12/17/2012  . Protein-calorie malnutrition, severe 12/13/2012  . Unintentional weight loss 12/09/2012  . 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 History of non-ST elevation myocardial infarction (NSTEMI) 02/04/2012  . 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 Anemia associated with acute blood loss 04/06/2011  . 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 Atrial fibrillation 12/13/2009  . HYPERLIPIDEMIA 01/27/2007  . RESTLESS LEG SYNDROME, MILD 01/27/2007  . CORONARY ARTERY DISEASE 01/27/2007  . 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 GERD 01/27/2007  . CORONARY ARTERY BYPASS GRAFT, HX OF 01/27/2007    CBC    Component Value Date/Time   WBC 9.1 11/17/2013 0435   WBC 4.8 09/10/2013 1455   RBC 3.86* 11/17/2013 0435   RBC 3.58* 11/08/2013 1140   RBC 4.51 09/10/2013 1455   HGB 12.6 11/17/2013 0435   HCT 38.4 11/17/2013 0435   PLT 108* 11/17/2013 0435   MCV 99.5 11/17/2013 0435   LYMPHSABS 1.7 11/14/2013 0923   LYMPHSABS 1.2 09/10/2013 1455   MONOABS 1.4* 11/14/2013 0923   EOSABS 0.1 11/14/2013 0923   EOSABS 0.1 09/10/2013 1455   BASOSABS 0.0 11/14/2013 0923   BASOSABS 0.0 09/10/2013 1455    CMP     Component Value Date/Time   NA 142 11/18/2013 0517   NA 139 09/28/2013 1551   K 3.3* 11/18/2013 0517   CL 104 11/18/2013 0517   CO2 30 11/18/2013 0517   GLUCOSE 116* 11/18/2013 0517   GLUCOSE 91 09/28/2013 1551   BUN 15 11/18/2013 0517   BUN 25 09/28/2013 1551   CREATININE 0.81 11/18/2013 0517   CREATININE 0.98 05/25/2013 1558   CALCIUM 8.0* 11/18/2013 0517   PROT 7.1 11/14/2013 0923   PROT 6.1 09/10/2013 1455   ALBUMIN 3.3* 11/14/2013 0923   AST 54* 11/14/2013 0923   ALT 308* 11/14/2013 0923   ALKPHOS 144* 11/14/2013 0923   BILITOT 2.3* 11/14/2013 0923   GFRNONAA 69* 11/18/2013 0517   GFRNONAA 30* 03/02/2013 1637   GFRAA 80* 11/18/2013 0517    GFRAA 35*  03/02/2013 1637    Assessment and Plan  CVA (cerebral vascular accident) MRI showing a subcentimeter focus of acute infarct in the left frontal cortex  -Patient developed CVA while on coumadin with INR 2.6; ? 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

## 2013-11-27 ENCOUNTER — Emergency Department (HOSPITAL_COMMUNITY): Payer: Medicare Other

## 2013-11-27 ENCOUNTER — Encounter (HOSPITAL_COMMUNITY): Payer: Self-pay | Admitting: Emergency Medicine

## 2013-11-27 ENCOUNTER — Inpatient Hospital Stay (HOSPITAL_COMMUNITY)
Admission: EM | Admit: 2013-11-27 | Discharge: 2013-12-08 | DRG: 308 | Disposition: A | Discharge status: Skilled Nursing Facility | Payer: Medicare Other | Attending: Internal Medicine | Admitting: Internal Medicine

## 2013-11-27 DIAGNOSIS — I639 Cerebral infarction, unspecified: Secondary | ICD-10-CM

## 2013-11-27 DIAGNOSIS — I5023 Acute on chronic systolic (congestive) heart failure: Secondary | ICD-10-CM

## 2013-11-27 DIAGNOSIS — I509 Heart failure, unspecified: Secondary | ICD-10-CM | POA: Diagnosis present

## 2013-11-27 DIAGNOSIS — N183 Chronic kidney disease, stage 3 unspecified: Secondary | ICD-10-CM | POA: Diagnosis present

## 2013-11-27 DIAGNOSIS — I4891 Unspecified atrial fibrillation: Principal | ICD-10-CM | POA: Diagnosis present

## 2013-11-27 DIAGNOSIS — R509 Fever, unspecified: Secondary | ICD-10-CM

## 2013-11-27 DIAGNOSIS — E875 Hyperkalemia: Secondary | ICD-10-CM | POA: Diagnosis present

## 2013-11-27 DIAGNOSIS — T826XXA Infection and inflammatory reaction due to cardiac valve prosthesis, initial encounter: Secondary | ICD-10-CM | POA: Diagnosis present

## 2013-11-27 DIAGNOSIS — T827XXA Infection and inflammatory reaction due to other cardiac and vascular devices, implants and grafts, initial encounter: Secondary | ICD-10-CM | POA: Diagnosis present

## 2013-11-27 DIAGNOSIS — I4510 Unspecified right bundle-branch block: Secondary | ICD-10-CM | POA: Diagnosis present

## 2013-11-27 DIAGNOSIS — Z66 Do not resuscitate: Secondary | ICD-10-CM | POA: Diagnosis present

## 2013-11-27 DIAGNOSIS — Z9981 Dependence on supplemental oxygen: Secondary | ICD-10-CM

## 2013-11-27 DIAGNOSIS — I4819 Other persistent atrial fibrillation: Secondary | ICD-10-CM

## 2013-11-27 DIAGNOSIS — J8410 Pulmonary fibrosis, unspecified: Secondary | ICD-10-CM | POA: Diagnosis present

## 2013-11-27 DIAGNOSIS — M359 Systemic involvement of connective tissue, unspecified: Secondary | ICD-10-CM | POA: Diagnosis present

## 2013-11-27 DIAGNOSIS — N179 Acute kidney failure, unspecified: Secondary | ICD-10-CM | POA: Diagnosis not present

## 2013-11-27 DIAGNOSIS — Z9861 Coronary angioplasty status: Secondary | ICD-10-CM

## 2013-11-27 DIAGNOSIS — G9340 Encephalopathy, unspecified: Secondary | ICD-10-CM

## 2013-11-27 DIAGNOSIS — E785 Hyperlipidemia, unspecified: Secondary | ICD-10-CM | POA: Diagnosis present

## 2013-11-27 DIAGNOSIS — Y831 Surgical operation with implant of artificial internal device as the cause of abnormal reaction of the patient, or of later complication, without mention of misadventure at the time of the procedure: Secondary | ICD-10-CM | POA: Diagnosis present

## 2013-11-27 DIAGNOSIS — Z8619 Personal history of other infectious and parasitic diseases: Secondary | ICD-10-CM

## 2013-11-27 DIAGNOSIS — J9611 Chronic respiratory failure with hypoxia: Secondary | ICD-10-CM

## 2013-11-27 DIAGNOSIS — R627 Adult failure to thrive: Secondary | ICD-10-CM

## 2013-11-27 DIAGNOSIS — Z954 Presence of other heart-valve replacement: Secondary | ICD-10-CM

## 2013-11-27 DIAGNOSIS — Z515 Encounter for palliative care: Secondary | ICD-10-CM

## 2013-11-27 DIAGNOSIS — J189 Pneumonia, unspecified organism: Secondary | ICD-10-CM | POA: Diagnosis not present

## 2013-11-27 DIAGNOSIS — M329 Systemic lupus erythematosus, unspecified: Secondary | ICD-10-CM | POA: Diagnosis present

## 2013-11-27 DIAGNOSIS — I129 Hypertensive chronic kidney disease with stage 1 through stage 4 chronic kidney disease, or unspecified chronic kidney disease: Secondary | ICD-10-CM | POA: Diagnosis present

## 2013-11-27 DIAGNOSIS — I38 Endocarditis, valve unspecified: Secondary | ICD-10-CM

## 2013-11-27 DIAGNOSIS — I5033 Acute on chronic diastolic (congestive) heart failure: Secondary | ICD-10-CM | POA: Diagnosis present

## 2013-11-27 DIAGNOSIS — N059 Unspecified nephritic syndrome with unspecified morphologic changes: Secondary | ICD-10-CM | POA: Diagnosis present

## 2013-11-27 DIAGNOSIS — M129 Arthropathy, unspecified: Secondary | ICD-10-CM | POA: Diagnosis present

## 2013-11-27 DIAGNOSIS — T826XXD Infection and inflammatory reaction due to cardiac valve prosthesis, subsequent encounter: Secondary | ICD-10-CM

## 2013-11-27 DIAGNOSIS — E43 Unspecified severe protein-calorie malnutrition: Secondary | ICD-10-CM

## 2013-11-27 DIAGNOSIS — R791 Abnormal coagulation profile: Secondary | ICD-10-CM | POA: Diagnosis present

## 2013-11-27 DIAGNOSIS — Z951 Presence of aortocoronary bypass graft: Secondary | ICD-10-CM

## 2013-11-27 DIAGNOSIS — I2510 Atherosclerotic heart disease of native coronary artery without angina pectoris: Secondary | ICD-10-CM | POA: Diagnosis present

## 2013-11-27 DIAGNOSIS — A0472 Enterocolitis due to Clostridium difficile, not specified as recurrent: Secondary | ICD-10-CM | POA: Diagnosis present

## 2013-11-27 DIAGNOSIS — K219 Gastro-esophageal reflux disease without esophagitis: Secondary | ICD-10-CM | POA: Diagnosis present

## 2013-11-27 DIAGNOSIS — R112 Nausea with vomiting, unspecified: Secondary | ICD-10-CM | POA: Diagnosis present

## 2013-11-27 DIAGNOSIS — I482 Chronic atrial fibrillation, unspecified: Secondary | ICD-10-CM

## 2013-11-27 DIAGNOSIS — Z7901 Long term (current) use of anticoagulants: Secondary | ICD-10-CM

## 2013-11-27 DIAGNOSIS — I330 Acute and subacute infective endocarditis: Secondary | ICD-10-CM | POA: Diagnosis present

## 2013-11-27 DIAGNOSIS — R197 Diarrhea, unspecified: Secondary | ICD-10-CM

## 2013-11-27 LAB — CBC WITH DIFFERENTIAL/PLATELET
BASOS ABS: 0 10*3/uL (ref 0.0–0.1)
BASOS PCT: 1 % (ref 0–1)
EOS ABS: 0.1 10*3/uL (ref 0.0–0.7)
Eosinophils Relative: 1 % (ref 0–5)
HCT: 40.4 % (ref 36.0–46.0)
Hemoglobin: 13 g/dL (ref 12.0–15.0)
Lymphocytes Relative: 22 % (ref 12–46)
Lymphs Abs: 1.5 10*3/uL (ref 0.7–4.0)
MCH: 32.3 pg (ref 26.0–34.0)
MCHC: 32.2 g/dL (ref 30.0–36.0)
MCV: 100.5 fL — ABNORMAL HIGH (ref 78.0–100.0)
Monocytes Absolute: 0.9 10*3/uL (ref 0.1–1.0)
Monocytes Relative: 13 % — ABNORMAL HIGH (ref 3–12)
Neutro Abs: 4.5 10*3/uL (ref 1.7–7.7)
Neutrophils Relative %: 64 % (ref 43–77)
PLATELETS: 117 10*3/uL — AB (ref 150–400)
RBC: 4.02 MIL/uL (ref 3.87–5.11)
RDW: 16.3 % — ABNORMAL HIGH (ref 11.5–15.5)
WBC: 7 10*3/uL (ref 4.0–10.5)

## 2013-11-27 LAB — TROPONIN I

## 2013-11-27 LAB — I-STAT ARTERIAL BLOOD GAS, ED
ACID-BASE DEFICIT: 6 mmol/L — AB (ref 0.0–2.0)
BICARBONATE: 16.7 meq/L — AB (ref 20.0–24.0)
O2 SAT: 100 %
PO2 ART: 228 mmHg — AB (ref 80.0–100.0)
Patient temperature: 99.2
TCO2: 17 mmol/L (ref 0–100)
pCO2 arterial: 25.3 mmHg — ABNORMAL LOW (ref 35.0–45.0)
pH, Arterial: 7.428 (ref 7.350–7.450)

## 2013-11-27 LAB — COMPREHENSIVE METABOLIC PANEL
ALBUMIN: 2.8 g/dL — AB (ref 3.5–5.2)
ALK PHOS: 225 U/L — AB (ref 39–117)
ALT: 17 U/L (ref 0–35)
AST: 30 U/L (ref 0–37)
Anion gap: 16 — ABNORMAL HIGH (ref 5–15)
BILIRUBIN TOTAL: 1.4 mg/dL — AB (ref 0.3–1.2)
BUN: 19 mg/dL (ref 6–23)
CHLORIDE: 101 meq/L (ref 96–112)
CO2: 20 mEq/L (ref 19–32)
Calcium: 9.1 mg/dL (ref 8.4–10.5)
Creatinine, Ser: 1.48 mg/dL — ABNORMAL HIGH (ref 0.50–1.10)
GFR calc Af Amer: 39 mL/min — ABNORMAL LOW (ref >90)
GFR calc non Af Amer: 33 mL/min — ABNORMAL LOW (ref >90)
Glucose, Bld: 100 mg/dL — ABNORMAL HIGH (ref 70–99)
Potassium: 5.5 mEq/L — ABNORMAL HIGH (ref 3.7–5.3)
SODIUM: 137 meq/L (ref 137–147)
TOTAL PROTEIN: 7 g/dL (ref 6.0–8.3)

## 2013-11-27 LAB — PROTIME-INR
INR: 1.89 — ABNORMAL HIGH (ref 0.00–1.49)
Prothrombin Time: 21.7 seconds — ABNORMAL HIGH (ref 11.6–15.2)

## 2013-11-27 LAB — D-DIMER, QUANTITATIVE (NOT AT ARMC): D DIMER QUANT: 1.92 ug{FEU}/mL — AB (ref 0.00–0.48)

## 2013-11-27 LAB — I-STAT CG4 LACTIC ACID, ED: Lactic Acid, Venous: 3.39 mmol/L — ABNORMAL HIGH (ref 0.5–2.2)

## 2013-11-27 LAB — PRO B NATRIURETIC PEPTIDE: PRO B NATRI PEPTIDE: 7774 pg/mL — AB (ref 0–450)

## 2013-11-27 MED ORDER — SODIUM CHLORIDE 0.9 % IV SOLN: INTRAVENOUS | Status: DC

## 2013-11-27 MED ORDER — FUROSEMIDE 10 MG/ML IJ SOLN
40.0000 mg | Freq: Once | INTRAMUSCULAR | Status: AC
  Administered 2013-11-28: 40 mg via INTRAVENOUS
  Filled 2013-11-27: qty 4

## 2013-11-27 MED ORDER — LEVOFLOXACIN IN D5W 750 MG/150ML IV SOLN: 750.0000 mg | Freq: Once | INTRAVENOUS | Status: DC

## 2013-11-27 MED ORDER — ALBUTEROL SULFATE (2.5 MG/3ML) 0.083% IN NEBU
5.0000 mg | INHALATION_SOLUTION | Freq: Once | RESPIRATORY_TRACT | Status: AC
  Administered 2013-11-27: 5 mg via RESPIRATORY_TRACT
  Filled 2013-11-27: qty 6

## 2013-11-27 MED ORDER — IOHEXOL 350 MG/ML SOLN
80.0000 mL | Freq: Once | INTRAVENOUS | Status: AC | PRN
  Administered 2013-11-27: 80 mL via INTRAVENOUS

## 2013-11-27 NOTE — ED Notes (Signed)
Patient found today at nursing facility at approx 1700 with respiratory distress, EMS called at approx 2000 for transport, patient on NRB at time of arrival,

## 2013-11-27 NOTE — ED Provider Notes (Addendum)
CSN: [id number]     Arrival date & time 11/27/13  2024 History   First MD Initiated Contact with Patient 11/27/13 2037     Chief Complaint  Patient presents with  . Respiratory Distress     (Consider location/radiation/quality/duration/timing/severity/associated sxs/prior Treatment) HPI Comments: Patient here complaining of acute onset of shortness of breath. Denies any chest pain. No recent cough noted. Some increased lower extremity edema. She does have history of CHF. No vomiting or diarrhea noted. EMS was called and she states on oxygen and transported here. Nothing seems to make her symptoms better or worse.  The history is provided by the patient.    Past Medical History  Diagnosis Date  . Coronary artery disease   . Atrial fibrillation     Now NSR  . GERD (gastroesophageal reflux disease)   . Hyperlipidemia   . 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 Anemia   . Angina   . Hypertension   . Blood transfusion     "related to bad case of bronchitis once; 1 yr ago given due to GIB" (12/17/2012)  . H/O hiatal hernia   . Lupus     "that's compromised her lungs somewhat" (12/17/2012)  . Insomnia   . Endocarditis   . Prosthetic valve endocarditis   . Enterococcal bacteremia   . Heart murmur   . CHF (congestive heart failure)   . Pneumonia     "once or twice" (12/17/2012)  . Chronic bronchitis     "used to get it once/yr; hasn't had it since ~ 1986" (12/17/2012)  . Shortness of breath     "all the times sometimes" (12/17/2012)  . On home oxygen therapy     "2L prn; always at night" (12/17/2012)  . Lower GI bleed ~ 01/2012  . Arthritis     "hands" (12/17/2012)  . 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 Right bundle branch block 11/07/2013   Past Surgical History  Procedure Laterality Date  . Givens capsule study  04/09/2011    Procedure: GIVENS CAPSULE STUDY;  Surgeon: Beryle Beams, MD;  Location: [place];  Service:  Endoscopy;  Laterality: N/A;  . Tee without cardioversion  02/06/2012    Procedure: TRANSESOPHAGEAL ECHOCARDIOGRAM (TEE);  Surgeon: Birdie Riddle, MD;  Location: [facility] ENDOSCOPY;  Service: Cardiovascular;  Laterality: N/A;  . Esophagogastroduodenoscopy  02/12/2012    Procedure: ESOPHAGOGASTRODUODENOSCOPY (EGD);  Surgeon: Beryle Beams, MD;  Location: [facility] ENDOSCOPY;  Service: Endoscopy;  Laterality: N/A;  . Tee without cardioversion N/A 07/15/2012    Procedure: TRANSESOPHAGEAL ECHOCARDIOGRAM (TEE);  Surgeon: Birdie Riddle, MD;  Location: [facility] ENDOSCOPY;  Service: Cardiovascular;  Laterality: N/A;  . Breast lumpectomy Left 1977    "benign" (12/17/2012)  . Tonsillectomy  1940's  . Coronary artery bypass graft  2006  . 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   . Cardiac catheterization      "probably 2-3" (12/17/2012)  . Coronary angioplasty with stent placement      "several put in over the years" (12/17/2012)  . Cataract extraction w/ intraocular lens  implant, bilateral Bilateral 11/2000  . Peripherally inserted central catheter insertion Right 12/15/2012    "upper arm" (12/17/2012   Family History  Problem Relation Age of Onset  . Heart disease Mother     CHF  . Mental illness Father     suicide  . Cancer Sister  pancreatic cancer  . COPD Brother    History  Substance Use Topics  . Smoking status: Never Smoker   . Smokeless tobacco: Never Used  . Alcohol Use: No   OB History   Grav Para Term Preterm Abortions TAB SAB Ect Mult Living                 Review of Systems  All other systems reviewed and are negative.     Allergies  Ceftriaxone and Codeine  Home Medications   Prior to Admission medications   Medication Sig Start Date End Date Taking? Authorizing Provider  amiodarone (PACERONE) 200 MG tablet Take 1 tablet (200 mg total) by mouth daily. 11/13/13   Hosie Poisson, MD  amoxicillin (AMOXIL) 500 MG capsule Take 1 capsule (500 mg total)  by mouth every 12 (twelve) hours. 09/15/13   Barton Dubois, MD  famotidine (PEPCID) 20 MG tablet Take 1 tablet (20 mg total) by mouth 2 (two) times daily. 10/27/13   Estill Dooms, MD  feeding supplement, ENSURE COMPLETE, (ENSURE COMPLETE) LIQD Take 237 mLs by mouth 3 (three) times daily between meals. 11/19/13   Kelvin Cellar, MD  furosemide (LASIX) 40 MG tablet Take 1 tablet (40 mg total) by mouth daily. 11/19/13   Kelvin Cellar, MD  gabapentin (NEURONTIN) 300 MG capsule Take 1 capsule (300 mg total) by mouth at bedtime. 10/27/13   Estill Dooms, MD  hydroxychloroquine (PLAQUENIL) 200 MG tablet Take 200 mg by mouth 2 (two) times daily.     Historical Provider, MD  IRON PO Take 1 tablet by mouth 2 (two) times daily.    Historical Provider, MD  metoprolol tartrate (LOPRESSOR) 25 MG tablet Take 25 mg by mouth daily.    Historical Provider, MD  mirtazapine (REMERON) 7.5 MG tablet Take 1 tablet (7.5 mg total) by mouth at bedtime. For appetite 08/31/13   Tiffany L Reed, DO  potassium chloride (KLOR-CON M15) 15 MEQ tablet Take 2 tablets (30 mEq total) by mouth 2 (two) times daily. 11/19/13   Kelvin Cellar, MD  rosuvastatin (CRESTOR) 10 MG tablet Take 10 mg by mouth every morning.     Historical Provider, MD  saccharomyces boulardii (FLORASTOR) 250 MG capsule Take 1 capsule (250 mg total) by mouth 2 (two) times daily. 08/31/13   Tiffany L Reed, DO  vancomycin (VANCOCIN) 50 mg/mL oral solution Take 5 mLs (250 mg total) by mouth every 6 (six) hours. 11/19/13   Kelvin Cellar, MD  warfarin (COUMADIN) 4 MG tablet Take 4 mg by mouth daily.    Historical Provider, MD   BP 96/52  Pulse 123  Temp(Src) 99.2 F (37.3 C)  Resp 30  SpO2 100% Physical Exam  Nursing note and vitals reviewed. Constitutional: She is oriented to person, place, and time. She appears well-developed and well-nourished.  Non-toxic appearance. No distress.  HENT:  Head: Normocephalic and atraumatic.  Eyes: Conjunctivae, EOM and lids are  normal. Pupils are equal, round, and reactive to light.  Neck: Normal range of motion. Neck supple. No tracheal deviation present. No mass present.  Cardiovascular: Regular rhythm and normal heart sounds.  Tachycardia present.  Exam reveals no gallop.   No murmur heard. Pulmonary/Chest: Effort normal. No stridor. No respiratory distress. She has decreased breath sounds. She has wheezes. She has no rhonchi. She has no rales.  Abdominal: Soft. Normal appearance and bowel sounds are normal. She exhibits no distension. There is no tenderness. There is no rebound and no CVA tenderness.  Musculoskeletal: Normal range of motion. She exhibits no edema and no tenderness.  Neurological: She is alert and oriented to person, place, and time. She has normal strength. No cranial nerve deficit or sensory deficit. GCS eye subscore is 4. GCS verbal subscore is 5. GCS motor subscore is 6.  Skin: Skin is warm and dry. No abrasion and no rash noted.  Psychiatric: She has a normal mood and affect. Her speech is normal and behavior is normal.    ED Course  Procedures (including critical care time) Labs Review Labs Reviewed  CULTURE, BLOOD (ROUTINE X 2)  CULTURE, BLOOD (ROUTINE X 2)  TROPONIN I  PRO B NATRIURETIC PEPTIDE  CBC WITH DIFFERENTIAL  COMPREHENSIVE METABOLIC PANEL  PROTIME-INR  I-STAT CG4 LACTIC ACID, ED  I-STAT ARTERIAL BLOOD GAS, ED    Imaging Review No results found.   EKG Interpretation   Date/Time:  Friday November 27 2013 20:30:07 EDT Ventricular Rate:  122 PR Interval:    QRS Duration: 162 QT Interval:  356 QTC Calculation: 507 R Axis:   -161 Text Interpretation:  Age not entered, assumed to be  73 years old for  purpose of ECG interpretation Atrial fibrillation Right bundle branch  block Confirmed by Kayne Yuhas  MD, Biance Moncrief ([id number]) on 11/27/2013 8:38:23 PM      MDM   Final diagnoses:  None   Patient started on empiric antibiotics for suspected pneumonia. Was given albuterol  nebulizer breathing has improved. She was initially on a nonrebreather but her blood gas showed no evidence of hypoxia. She is resting comfortably on 3 L of oxygen by nasal cannula. Will be admitted by the hospitalist  Patient had a repeat EKG which continues to show a right bundle branch block fibrillation. Does have ST elevation in aVR which I discussed with the cardiologist on call and she has no other changes and he agrees that this is not a STEMI. Patient has not had any chest pain.    Leota Jacobsen, MD 11/27/13 2357  Leota Jacobsen, MD 11/28/13 0010  Leota Jacobsen, MD 11/28/13 [phone]

## 2013-11-28 ENCOUNTER — Inpatient Hospital Stay (HOSPITAL_COMMUNITY): Payer: Medicare Other

## 2013-11-28 ENCOUNTER — Encounter (HOSPITAL_COMMUNITY): Payer: Self-pay | Admitting: Internal Medicine

## 2013-11-28 DIAGNOSIS — K219 Gastro-esophageal reflux disease without esophagitis: Secondary | ICD-10-CM | POA: Diagnosis present

## 2013-11-28 DIAGNOSIS — Z515 Encounter for palliative care: Secondary | ICD-10-CM | POA: Diagnosis not present

## 2013-11-28 DIAGNOSIS — E785 Hyperlipidemia, unspecified: Secondary | ICD-10-CM | POA: Diagnosis present

## 2013-11-28 DIAGNOSIS — M129 Arthropathy, unspecified: Secondary | ICD-10-CM | POA: Diagnosis present

## 2013-11-28 DIAGNOSIS — I509 Heart failure, unspecified: Secondary | ICD-10-CM | POA: Diagnosis present

## 2013-11-28 DIAGNOSIS — I4891 Unspecified atrial fibrillation: Secondary | ICD-10-CM | POA: Diagnosis present

## 2013-11-28 DIAGNOSIS — A0472 Enterocolitis due to Clostridium difficile, not specified as recurrent: Secondary | ICD-10-CM | POA: Diagnosis present

## 2013-11-28 DIAGNOSIS — Z9981 Dependence on supplemental oxygen: Secondary | ICD-10-CM | POA: Diagnosis not present

## 2013-11-28 DIAGNOSIS — J189 Pneumonia, unspecified organism: Secondary | ICD-10-CM | POA: Diagnosis present

## 2013-11-28 DIAGNOSIS — I2510 Atherosclerotic heart disease of native coronary artery without angina pectoris: Secondary | ICD-10-CM | POA: Diagnosis present

## 2013-11-28 DIAGNOSIS — I4510 Unspecified right bundle-branch block: Secondary | ICD-10-CM | POA: Diagnosis present

## 2013-11-28 DIAGNOSIS — E875 Hyperkalemia: Secondary | ICD-10-CM | POA: Diagnosis present

## 2013-11-28 DIAGNOSIS — M329 Systemic lupus erythematosus, unspecified: Secondary | ICD-10-CM | POA: Diagnosis present

## 2013-11-28 DIAGNOSIS — I330 Acute and subacute infective endocarditis: Secondary | ICD-10-CM | POA: Diagnosis present

## 2013-11-28 DIAGNOSIS — Z7901 Long term (current) use of anticoagulants: Secondary | ICD-10-CM | POA: Diagnosis not present

## 2013-11-28 DIAGNOSIS — N183 Chronic kidney disease, stage 3 unspecified: Secondary | ICD-10-CM | POA: Diagnosis present

## 2013-11-28 DIAGNOSIS — N179 Acute kidney failure, unspecified: Secondary | ICD-10-CM | POA: Diagnosis not present

## 2013-11-28 DIAGNOSIS — R791 Abnormal coagulation profile: Secondary | ICD-10-CM | POA: Diagnosis present

## 2013-11-28 DIAGNOSIS — I5033 Acute on chronic diastolic (congestive) heart failure: Secondary | ICD-10-CM | POA: Diagnosis present

## 2013-11-28 DIAGNOSIS — I129 Hypertensive chronic kidney disease with stage 1 through stage 4 chronic kidney disease, or unspecified chronic kidney disease: Secondary | ICD-10-CM | POA: Diagnosis present

## 2013-11-28 DIAGNOSIS — Z951 Presence of aortocoronary bypass graft: Secondary | ICD-10-CM | POA: Diagnosis not present

## 2013-11-28 DIAGNOSIS — N059 Unspecified nephritic syndrome with unspecified morphologic changes: Secondary | ICD-10-CM | POA: Diagnosis present

## 2013-11-28 DIAGNOSIS — T827XXA Infection and inflammatory reaction due to other cardiac and vascular devices, implants and grafts, initial encounter: Secondary | ICD-10-CM | POA: Diagnosis present

## 2013-11-28 DIAGNOSIS — J8410 Pulmonary fibrosis, unspecified: Secondary | ICD-10-CM | POA: Diagnosis present

## 2013-11-28 DIAGNOSIS — Z9861 Coronary angioplasty status: Secondary | ICD-10-CM | POA: Diagnosis not present

## 2013-11-28 DIAGNOSIS — R112 Nausea with vomiting, unspecified: Secondary | ICD-10-CM | POA: Diagnosis present

## 2013-11-28 DIAGNOSIS — I5023 Acute on chronic systolic (congestive) heart failure: Secondary | ICD-10-CM

## 2013-11-28 DIAGNOSIS — Z954 Presence of other heart-valve replacement: Secondary | ICD-10-CM | POA: Diagnosis not present

## 2013-11-28 DIAGNOSIS — Z66 Do not resuscitate: Secondary | ICD-10-CM | POA: Diagnosis present

## 2013-11-28 DIAGNOSIS — Y831 Surgical operation with implant of artificial internal device as the cause of abnormal reaction of the patient, or of later complication, without mention of misadventure at the time of the procedure: Secondary | ICD-10-CM | POA: Diagnosis present

## 2013-11-28 LAB — COMPREHENSIVE METABOLIC PANEL
ALBUMIN: 2.5 g/dL — AB (ref 3.5–5.2)
ALT: 15 U/L (ref 0–35)
AST: 26 U/L (ref 0–37)
Alkaline Phosphatase: 184 U/L — ABNORMAL HIGH (ref 39–117)
Anion gap: 17 — ABNORMAL HIGH (ref 5–15)
BUN: 21 mg/dL (ref 6–23)
CALCIUM: 8.8 mg/dL (ref 8.4–10.5)
CO2: 16 mEq/L — ABNORMAL LOW (ref 19–32)
Chloride: 102 mEq/L (ref 96–112)
Creatinine, Ser: 1.54 mg/dL — ABNORMAL HIGH (ref 0.50–1.10)
GFR calc Af Amer: 37 mL/min — ABNORMAL LOW (ref >90)
GFR calc non Af Amer: 32 mL/min — ABNORMAL LOW (ref >90)
Glucose, Bld: 89 mg/dL (ref 70–99)
Potassium: 5.2 mEq/L (ref 3.7–5.3)
Sodium: 135 mEq/L — ABNORMAL LOW (ref 137–147)
Total Bilirubin: 1.5 mg/dL — ABNORMAL HIGH (ref 0.3–1.2)
Total Protein: 6.1 g/dL (ref 6.0–8.3)

## 2013-11-28 LAB — URINALYSIS, ROUTINE W REFLEX MICROSCOPIC
Bilirubin Urine: NEGATIVE
GLUCOSE, UA: NEGATIVE mg/dL
Hgb urine dipstick: NEGATIVE
Ketones, ur: NEGATIVE mg/dL
Leukocytes, UA: NEGATIVE
Nitrite: NEGATIVE
PROTEIN: NEGATIVE mg/dL
SPECIFIC GRAVITY, URINE: 1.023 (ref 1.005–1.030)
Urobilinogen, UA: 0.2 mg/dL (ref 0.0–1.0)
pH: 5 (ref 5.0–8.0)

## 2013-11-28 LAB — MRSA PCR SCREENING: MRSA BY PCR: NEGATIVE

## 2013-11-28 LAB — CBC WITH DIFFERENTIAL/PLATELET
Basophils Absolute: 0 10*3/uL (ref 0.0–0.1)
Basophils Relative: 1 % (ref 0–1)
EOS ABS: 0 10*3/uL (ref 0.0–0.7)
Eosinophils Relative: 0 % (ref 0–5)
HEMATOCRIT: 34.3 % — AB (ref 36.0–46.0)
Hemoglobin: 11.5 g/dL — ABNORMAL LOW (ref 12.0–15.0)
LYMPHS ABS: 1.1 10*3/uL (ref 0.7–4.0)
LYMPHS PCT: 22 % (ref 12–46)
MCH: 32.4 pg (ref 26.0–34.0)
MCHC: 33.5 g/dL (ref 30.0–36.0)
MCV: 96.6 fL (ref 78.0–100.0)
Monocytes Absolute: 0.7 10*3/uL (ref 0.1–1.0)
Monocytes Relative: 15 % — ABNORMAL HIGH (ref 3–12)
Neutro Abs: 3.1 10*3/uL (ref 1.7–7.7)
Neutrophils Relative %: 62 % (ref 43–77)
Platelets: 99 10*3/uL — ABNORMAL LOW (ref 150–400)
RBC: 3.55 MIL/uL — ABNORMAL LOW (ref 3.87–5.11)
RDW: 15.9 % — ABNORMAL HIGH (ref 11.5–15.5)
WBC: 4.9 10*3/uL (ref 4.0–10.5)

## 2013-11-28 LAB — TROPONIN I
Troponin I: <0.3 ng/mL (ref <0.30)
Troponin I: <0.3 ng/mL (ref <0.30)

## 2013-11-28 LAB — TSH: TSH: 4.06 u[IU]/mL (ref 0.350–4.500)

## 2013-11-28 LAB — PROTIME-INR
INR: 2.2 — AB (ref 0.00–1.49)
PROTHROMBIN TIME: 24.4 s — AB (ref 11.6–15.2)

## 2013-11-28 LAB — GLUCOSE, CAPILLARY: GLUCOSE-CAPILLARY: 90 mg/dL (ref 70–99)

## 2013-11-28 LAB — CREATININE, URINE, RANDOM: Creatinine, Urine: 53.97 mg/dL

## 2013-11-28 LAB — LACTIC ACID, PLASMA: LACTIC ACID, VENOUS: 1.5 mmol/L (ref 0.5–2.2)

## 2013-11-28 LAB — SODIUM, URINE, RANDOM: SODIUM UR: 23 meq/L

## 2013-11-28 MED ORDER — HEPARIN (PORCINE) IN NACL 100-0.45 UNIT/ML-% IJ SOLN
1100.0000 [IU]/h | INTRAMUSCULAR | Status: DC
  Administered 2013-11-28: 1100 [IU]/h via INTRAVENOUS
  Filled 2013-11-28: qty 250

## 2013-11-28 MED ORDER — SODIUM CHLORIDE 0.9 % IJ SOLN
10.0000 mL | INTRAMUSCULAR | Status: DC | PRN
Start: 2013-11-28 — End: 2013-12-08
  Administered 2013-12-02: 20 mL
  Administered 2013-12-02 – 2013-12-08 (×8): 10 mL

## 2013-11-28 MED ORDER — SODIUM CHLORIDE 0.9 % IJ SOLN
3.0000 mL | Freq: Two times a day (BID) | INTRAMUSCULAR | Status: DC
  Administered 2013-11-28 – 2013-12-08 (×18): 3 mL via INTRAVENOUS

## 2013-11-28 MED ORDER — MIRTAZAPINE 7.5 MG PO TABS
7.5000 mg | ORAL_TABLET | Freq: Every day | ORAL | Status: DC
  Administered 2013-11-28 – 2013-12-03 (×6): 7.5 mg via ORAL
  Filled 2013-11-28 (×8): qty 1

## 2013-11-28 MED ORDER — AMIODARONE HCL 200 MG PO TABS
200.0000 mg | ORAL_TABLET | Freq: Every day | ORAL | Status: DC
  Administered 2013-11-28 – 2013-12-08 (×10): 200 mg via ORAL
  Filled 2013-11-28 (×11): qty 1

## 2013-11-28 MED ORDER — PIPERACILLIN-TAZOBACTAM 3.375 G IVPB
3.3750 g | Freq: Three times a day (TID) | INTRAVENOUS | Status: DC
  Administered 2013-11-28 – 2013-12-06 (×25): 3.375 g via INTRAVENOUS
  Filled 2013-11-28 (×29): qty 50

## 2013-11-28 MED ORDER — ONDANSETRON HCL 4 MG PO TABS: 4.0000 mg | ORAL_TABLET | Freq: Four times a day (QID) | ORAL | Status: DC | PRN

## 2013-11-28 MED ORDER — WARFARIN SODIUM 4 MG PO TABS
4.0000 mg | ORAL_TABLET | Freq: Every day | ORAL | Status: DC
  Administered 2013-11-28 – 2013-11-30 (×3): 4 mg via ORAL
  Filled 2013-11-28 (×4): qty 1

## 2013-11-28 MED ORDER — GABAPENTIN 300 MG PO CAPS
300.0000 mg | ORAL_CAPSULE | Freq: Every day | ORAL | Status: DC
  Administered 2013-11-28 – 2013-12-03 (×7): 300 mg via ORAL
  Filled 2013-11-28 (×9): qty 1

## 2013-11-28 MED ORDER — FAMOTIDINE 20 MG PO TABS
20.0000 mg | ORAL_TABLET | Freq: Two times a day (BID) | ORAL | Status: DC
  Administered 2013-11-28 – 2013-12-03 (×12): 20 mg via ORAL
  Filled 2013-11-28 (×16): qty 1

## 2013-11-28 MED ORDER — SODIUM CHLORIDE 0.9 % IJ SOLN
10.0000 mL | Freq: Two times a day (BID) | INTRAMUSCULAR | Status: DC
  Administered 2013-11-28: 10 mL
  Administered 2013-11-28: 20 mL
  Administered 2013-11-29: 10 mL
  Administered 2013-11-29: 20 mL
  Administered 2013-11-30 – 2013-12-08 (×12): 10 mL

## 2013-11-28 MED ORDER — ATORVASTATIN CALCIUM 20 MG PO TABS
20.0000 mg | ORAL_TABLET | Freq: Every day | ORAL | Status: DC
  Administered 2013-11-28 – 2013-12-03 (×6): 20 mg via ORAL
  Filled 2013-11-28 (×8): qty 1

## 2013-11-28 MED ORDER — HYDROXYCHLOROQUINE SULFATE 200 MG PO TABS
200.0000 mg | ORAL_TABLET | Freq: Two times a day (BID) | ORAL | Status: DC
  Administered 2013-11-28 – 2013-12-03 (×13): 200 mg via ORAL
  Filled 2013-11-28 (×17): qty 1

## 2013-11-28 MED ORDER — VANCOMYCIN HCL 10 G IV SOLR
1250.0000 mg | Freq: Once | INTRAVENOUS | Status: AC
  Administered 2013-11-28: 1250 mg via INTRAVENOUS
  Filled 2013-11-28: qty 1250

## 2013-11-28 MED ORDER — SACCHAROMYCES BOULARDII 250 MG PO CAPS
250.0000 mg | ORAL_CAPSULE | Freq: Two times a day (BID) | ORAL | Status: DC
  Administered 2013-11-28 – 2013-12-03 (×12): 250 mg via ORAL
  Filled 2013-11-28 (×18): qty 1

## 2013-11-28 MED ORDER — POTASSIUM CHLORIDE CRYS ER 20 MEQ PO TBCR
30.0000 meq | EXTENDED_RELEASE_TABLET | Freq: Two times a day (BID) | ORAL | Status: DC
  Administered 2013-11-28 – 2013-12-03 (×11): 30 meq via ORAL
  Filled 2013-11-28 (×15): qty 1

## 2013-11-28 MED ORDER — VANCOMYCIN 50 MG/ML ORAL SOLUTION
250.0000 mg | Freq: Four times a day (QID) | ORAL | Status: DC
  Administered 2013-11-28 – 2013-12-01 (×13): 250 mg via ORAL
  Filled 2013-11-28 (×17): qty 5

## 2013-11-28 MED ORDER — ONDANSETRON HCL 4 MG/2ML IJ SOLN: 4.0000 mg | Freq: Four times a day (QID) | INTRAMUSCULAR | Status: DC | PRN

## 2013-11-28 MED ORDER — ACETAMINOPHEN 650 MG RE SUPP: 650.0000 mg | Freq: Four times a day (QID) | RECTAL | Status: DC | PRN

## 2013-11-28 MED ORDER — WARFARIN - PHARMACIST DOSING INPATIENT
Freq: Every day | Status: DC
  Administered 2013-12-03: 19:00:00

## 2013-11-28 MED ORDER — ENSURE COMPLETE PO LIQD
237.0000 mL | Freq: Three times a day (TID) | ORAL | Status: DC
  Administered 2013-11-28 – 2013-12-03 (×9): 237 mL via ORAL

## 2013-11-28 MED ORDER — AMOXICILLIN 500 MG PO CAPS
500.0000 mg | ORAL_CAPSULE | Freq: Two times a day (BID) | ORAL | Status: DC
  Filled 2013-11-28 (×3): qty 1

## 2013-11-28 MED ORDER — FUROSEMIDE 40 MG PO TABS
40.0000 mg | ORAL_TABLET | Freq: Every day | ORAL | Status: DC
  Administered 2013-11-28 – 2013-12-03 (×6): 40 mg via ORAL
  Filled 2013-11-28 (×6): qty 1

## 2013-11-28 MED ORDER — VANCOMYCIN HCL IN DEXTROSE 750-5 MG/150ML-% IV SOLN
750.0000 mg | INTRAVENOUS | Status: DC
  Administered 2013-11-29 – 2013-11-30 (×2): 750 mg via INTRAVENOUS
  Filled 2013-11-28 (×2): qty 150

## 2013-11-28 MED ORDER — ACETAMINOPHEN 325 MG PO TABS: 650.0000 mg | ORAL_TABLET | Freq: Four times a day (QID) | ORAL | Status: DC | PRN

## 2013-11-28 MED ORDER — METOPROLOL TARTRATE 25 MG PO TABS
25.0000 mg | ORAL_TABLET | Freq: Every day | ORAL | Status: DC
  Administered 2013-11-28 – 2013-12-01 (×4): 25 mg via ORAL
  Filled 2013-11-28 (×5): qty 1

## 2013-11-28 NOTE — Progress Notes (Addendum)
INITIAL NUTRITION ASSESSMENT  DOCUMENTATION CODES Per approved criteria  -Severe malnutrition in the context of acute illness or injury   INTERVENTION:  Ensure Complete PO TID, each supplement provides 350 kcal and 13 grams of protein.  Regular diet, V.O Dr. Wendee Beavers  NUTRITION DIAGNOSIS: Inadequate oral intake related to poor appetite as evidenced by poor intake of meals.   Goal: Intake to meet >90% of estimated nutrition needs.  Monitor:  Diet advancement, PO intake, labs, weight trend.  Reason for Assessment: MST  73 y.o. female  Admitting Dx: Atrial fibrillation with RVR  ASSESSMENT: 73 y.o. female with history of recurrent endocarditis, chronic atrial fibrillation, bioprosthetic aortic valve, CHF, recently admitted for CVA, recently treated for C. difficile colitis and is on oral vancomycin, CAD status post CABG, lupus was brought to the ER because of nausea and vomiting.   Multiple recent hospitalizations noted. Patient recently eating very poorly due to poor appetite. Drinks Ensure at home, but doesn't really like it. Per discussion with daughter-in-law, patient has had minimal intake for the past 7-10 days. She goes through periods of eating well and eating poorly. Has done well on Megace in the past, but per discussion with physician, patient not a candidate for Megace due to clot in her lung.  Per discussion with Dr. Wendee Beavers, will advance to a regular diet today.  Pt meets criteria for severe MALNUTRITION in the context of acute illness/injury as evidenced by intake </= 50% of estimated energy requirement for >/= 5 days and 3% weight loss in the past week.  Nutrition Focused Physical Exam:  Subcutaneous Fat:  Orbital Region: mild depletion Upper Arm Region: mild depletion Thoracic and Lumbar Region: NA  Muscle:  Temple Region: mild depletion Clavicle Bone Region: mild depletion Clavicle and Acromion Bone Region: mild depletion Scapular Bone Region: NA Dorsal Hand:  WNL Patellar Region: WNL Anterior Thigh Region: WNL Posterior Calf Region: WNL  Edema: none   Height: Ht Readings from Last 1 Encounters:  11/28/13 5\' 7"  (1.702 m)    Weight: Wt Readings from Last 1 Encounters:  11/28/13 167 lb 8.8 oz (76 kg)    Ideal Body Weight: 61.4 kg  % Ideal Body Weight: 124%  Wt Readings from Last 10 Encounters:  11/28/13 167 lb 8.8 oz (76 kg)  11/23/13 172 lb (78.019 kg)  11/19/13 172 lb 6.4 oz (78.2 kg)  11/13/13 173 lb 1.6 oz (78.518 kg)  09/28/13 150 lb (68.04 kg)  09/15/13 134 lb 3.2 oz (60.873 kg)  08/31/13 140 lb (63.504 kg)  08/03/13 147 lb (66.679 kg)  07/27/13 144 lb (65.318 kg)  07/09/13 151 lb 12.8 oz (68.856 kg)    Usual Body Weight: 172 lbs 1 week ago  % Usual Body Weight: 97%  BMI:  Body mass index is 26.24 kg/(m^2).  Estimated Nutritional Needs: Kcal: 1550-1750 Protein: 80-90 gm Fluid: 1.6-1.8 L  Skin: no issues  Diet Order: Clear Liquid  Supplements: Ensure Complete TID between meals  EDUCATION NEEDS: -Education not appropriate at this time   Intake/Output Summary (Last 24 hours) at 11/28/13 1120 Last data filed at 11/28/13 0839  Gross per 24 hour  Intake 115.15 ml  Output      0 ml  Net 115.15 ml    Last BM: 9/5   Labs:   Recent Labs Lab 11/27/13 2108 11/28/13 0550  NA 137 135*  K 5.5* 5.2  CL 101 102  CO2 20 16*  BUN 19 21  CREATININE 1.48* 1.54*  CALCIUM 9.1  8.8  GLUCOSE 100* 89    CBG (last 3)   Recent Labs  11/28/13 0255  GLUCAP 90    Scheduled Meds: . amiodarone  200 mg Oral Daily  . atorvastatin  20 mg Oral q1800  . famotidine  20 mg Oral BID  . feeding supplement (ENSURE COMPLETE)  237 mL Oral TID BM  . furosemide  40 mg Oral Daily  . gabapentin  300 mg Oral QHS  . hydroxychloroquine  200 mg Oral BID  . metoprolol tartrate  25 mg Oral Daily  . mirtazapine  7.5 mg Oral QHS  . piperacillin-tazobactam (ZOSYN)  IV  3.375 g Intravenous 3 times per day  . potassium chloride   30 mEq Oral BID  . saccharomyces boulardii  250 mg Oral BID  . sodium chloride  10-40 mL Intracatheter Q12H  . sodium chloride  3 mL Intravenous Q12H  . vancomycin  250 mg Oral 4 times per day  . [START ON 11/29/2013] vancomycin  750 mg Intravenous Q24H    Continuous Infusions: . heparin 1,100 Units/hr (11/28/13 0600)    Past Medical History  Diagnosis Date  . Coronary artery disease   . Atrial fibrillation     Now NSR  . GERD (gastroesophageal reflux disease)   . Hyperlipidemia   . 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 Anemia   . Angina   . Hypertension   . Blood transfusion     "related to bad case of bronchitis once; 1 yr ago given due to GIB" (12/17/2012)  . H/O hiatal hernia   . Lupus     "that's compromised her lungs somewhat" (12/17/2012)  . Insomnia   . Endocarditis   . Prosthetic valve endocarditis   . Enterococcal bacteremia   . Heart murmur   . CHF (congestive heart failure)   . Pneumonia     "once or twice" (12/17/2012)  . Chronic bronchitis     "used to get it once/yr; hasn't had it since ~ 1986" (12/17/2012)  . Shortness of breath     "all the times sometimes" (12/17/2012)  . On home oxygen therapy     "2L prn; always at night" (12/17/2012)  . Lower GI bleed ~ 01/2012  . Arthritis     "hands" (12/17/2012)  . 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 Right bundle branch block 11/07/2013    Past Surgical History  Procedure Laterality Date  . Givens capsule study  04/09/2011    Procedure: GIVENS CAPSULE STUDY;  Surgeon: Beryle Beams, MD;  Location: [place];  Service: Endoscopy;  Laterality: N/A;  . Tee without cardioversion  02/06/2012    Procedure: TRANSESOPHAGEAL ECHOCARDIOGRAM (TEE);  Surgeon: Birdie Riddle, MD;  Location: [facility] ENDOSCOPY;  Service: Cardiovascular;  Laterality: N/A;  . Esophagogastroduodenoscopy  02/12/2012    Procedure: ESOPHAGOGASTRODUODENOSCOPY (EGD);  Surgeon: Beryle Beams, MD;   Location: [facility] ENDOSCOPY;  Service: Endoscopy;  Laterality: N/A;  . Tee without cardioversion N/A 07/15/2012    Procedure: TRANSESOPHAGEAL ECHOCARDIOGRAM (TEE);  Surgeon: Birdie Riddle, MD;  Location: [facility] ENDOSCOPY;  Service: Cardiovascular;  Laterality: N/A;  . Breast lumpectomy Left 1977    "benign" (12/17/2012)  . Tonsillectomy  1940's  . Coronary artery bypass graft  2006  . 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   . Cardiac catheterization      "probably 2-3" (12/17/2012)  . Coronary angioplasty  with stent placement      "several put in over the years" (12/17/2012)  . Cataract extraction w/ intraocular lens  implant, bilateral Bilateral 11/2000  . Peripherally inserted central catheter insertion Right 12/15/2012    "upper arm" (12/17/2012    Molli Barrows, RD, LDN, [place] Pager [phone] After Hours Pager [phone]

## 2013-11-28 NOTE — Progress Notes (Signed)
Patient seen and evaluated earlier this am by my associate. Please refer to his H and P for details regarding assessment and plan.  Lost IV access and patient is currently on antibiotics as well as heparin.  Will place order for picc line insertion.  Will reassess next am.  Velvet Bathe

## 2013-11-28 NOTE — Progress Notes (Addendum)
ANTICOAGULATION CONSULT NOTE - Initial Consult  Pharmacy Consult for Heparin/Coumadin Indication: atrial fibrillation  Allergies  Allergen Reactions  . Ceftriaxone     RASH BUT TOLERATED AMPICILLIN AND IMIPENEM WITHOUT PROBLEMS  . Codeine Nausea Only    Patient Measurements: Height: 5' 6.14" (168 cm) Weight: 171 lb 15.3 oz (78 kg) IBW/kg (Calculated) : 59.63  Vital Signs: Temp: 98.5 F (36.9 C) (09/05 0144) Temp src: Oral (09/05 0144) BP: 100/72 mmHg (09/05 0200) Pulse Rate: 117 (09/05 0200)  Labs:  Recent Labs  11/27/13 2108 11/27/13 2111  HGB 13.0  --   HCT 40.4  --   PLT 117*  --   LABPROT 21.7*  --   INR 1.89*  --   CREATININE 1.48*  --   TROPONINI  --  <0.30    Estimated Creatinine Clearance: 34.7 ml/min (by C-G formula based on Cr of 1.48).   Medical History: Past Medical History  Diagnosis Date  . Coronary artery disease   . Atrial fibrillation     Now NSR  . GERD (gastroesophageal reflux disease)   . Hyperlipidemia   . 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 Anemia   . Angina   . Hypertension   . Blood transfusion     "related to bad case of bronchitis once; 1 yr ago given due to GIB" (12/17/2012)  . H/O hiatal hernia   . Lupus     "that's compromised her lungs somewhat" (12/17/2012)  . Insomnia   . Endocarditis   . Prosthetic valve endocarditis   . Enterococcal bacteremia   . Heart murmur   . CHF (congestive heart failure)   . Pneumonia     "once or twice" (12/17/2012)  . Chronic bronchitis     "used to get it once/yr; hasn't had it since ~ 1986" (12/17/2012)  . Shortness of breath     "all the times sometimes" (12/17/2012)  . On home oxygen therapy     "2L prn; always at night" (12/17/2012)  . Lower GI bleed ~ 01/2012  . Arthritis     "hands" (12/17/2012)  . 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 Right bundle branch block 11/07/2013    Medications:  Amiodarone  Amoxicillin  Pepcid   Lasix  Neurontin  Plaquenil  Lopressor  Remeron  KCl  Crestor  FLorastor  Vancomycin   Coumadin 4 mg daily  Assessment: 73 yo female with Afib, INR subtherapeutic, for heparin  Goal of Therapy:  Heparin level 0.3-0.7 units/ml Monitor platelets by anticoagulation protocol: Yes   Plan:  Start heparin 1100 units/hr Check heparin level in 8 hours.  Daily INR  Arthur Speagle, Bronson Curb 11/28/2013,2:59 AM

## 2013-11-28 NOTE — Progress Notes (Signed)
[place] for Heparin/Coumadin Indication: atrial fibrillation  Allergies  Allergen Reactions  . Ceftriaxone     RASH BUT TOLERATED AMPICILLIN AND IMIPENEM WITHOUT PROBLEMS  . Codeine Nausea Only    Patient Measurements: Height: 5\' 7"  (170.2 cm) Weight: 167 lb 8.8 oz (76 kg) IBW/kg (Calculated) : 61.6  Vital Signs: Temp: 98.5 F (36.9 C) (09/05 1221) Temp src: Oral (09/05 1221) BP: 106/60 mmHg (09/05 0839) Pulse Rate: 89 (09/05 0839)  Labs:  Recent Labs  11/27/13 2108 11/27/13 2111 11/28/13 0550 11/28/13 0900 11/28/13 1300  HGB 13.0  --  11.5*  --   --   HCT 40.4  --  34.3*  --   --   PLT 117*  --  99*  --   --   LABPROT 21.7*  --   --   --  24.4*  INR 1.89*  --   --   --  2.20*  CREATININE 1.48*  --  1.54*  --   --   TROPONINI  --  <0.30 <0.30 <0.30  --     Estimated Creatinine Clearance: 33.6 ml/min (by C-G formula based on Cr of 1.54).  Medications:  Amiodarone  Amoxicillin  Pepcid  Lasix  Neurontin  Plaquenil  Lopressor  Remeron  KCl  Crestor  FLorastor  Vancomycin   Coumadin 4 mg daily  Assessment: 73 yo female with Afib, INR this afternoon now at goal (2.2) d/w Dr.Vega and ok to d/c heparin and continue warfarin dosing. Hgb was trending down this morning, will follow trend in am, no bleeding noted.  Goal of Therapy:  INR goal 2-3 Monitor platelets by anticoagulation protocol: Yes   Plan:  D/c heparin Warfarin 4mg  daily Daily INR for now  Erin Hearing PharmD., BCPS Clinical Pharmacist Pager [phone] 11/28/2013 2:06 PM

## 2013-11-28 NOTE — H&P (Signed)
[facility] History and Physical  [name] YDX:[id number] DOB: [birth year] DOA: 11/27/2013  Referring physician: ER physician. PCP: Hollace Kinnier, DO  Chief Complaint: Nausea vomiting.  HPI: [name] is a 73 y.o. female with history of recurrent endocarditis, chronic atrial fibrillation, bioprosthetic aortic valve, CHF, recently admitted for CVA, recently treated for C. difficile colitis and is on oral vancomycin, CAD status post CABG, lupus was brought to the ER because of nausea and vomiting. As per patient's daughter patient had at least 4-5 episodes of nausea vomiting last evening at the nursing home. Patient did not have any abdominal pain or diarrhea. In the ER patient was found to be mildly short of breath and A. fib with RVR. CT angiogram of the chest was done which was negative for any acute PE but did show some chronic nonocclusive pulmonary embolism and bilateral pleural effusion. Patient on exam is mildly short of breath. Denies any chest pain. Denies any corrective cough fever chills. In the ER patient was started on empiric antibiotics and also was given Lasix 40 mg IV one dose by the ER patient. Patient will be admitted for further management of A. fib with RVR and nausea vomiting. Patient did not have any further episodes of nausea vomiting in the ER.   Review of Systems: As presented in the history of presenting illness, rest negative.  Past Medical History  Diagnosis Date  . Coronary artery disease   . Atrial fibrillation     Now NSR  . GERD (gastroesophageal reflux disease)   . Hyperlipidemia   . 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 Anemia   . Angina   . Hypertension   . Blood transfusion     "related to bad case of bronchitis once; 1 yr ago given due to GIB" (12/17/2012)  . H/O hiatal hernia   . Lupus     "that's compromised her lungs somewhat" (12/17/2012)  . Insomnia   . Endocarditis   . Prosthetic valve endocarditis   .  Enterococcal bacteremia   . Heart murmur   . CHF (congestive heart failure)   . Pneumonia     "once or twice" (12/17/2012)  . Chronic bronchitis     "used to get it once/yr; hasn't had it since ~ 1986" (12/17/2012)  . Shortness of breath     "all the times sometimes" (12/17/2012)  . On home oxygen therapy     "2L prn; always at night" (12/17/2012)  . Lower GI bleed ~ 01/2012  . Arthritis     "hands" (12/17/2012)  . 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 Right bundle branch block 11/07/2013   Past Surgical History  Procedure Laterality Date  . Givens capsule study  04/09/2011    Procedure: GIVENS CAPSULE STUDY;  Surgeon: Beryle Beams, MD;  Location: [facility];  Service: Endoscopy;  Laterality: N/A;  . Tee without cardioversion  02/06/2012    Procedure: TRANSESOPHAGEAL ECHOCARDIOGRAM (TEE);  Surgeon: Birdie Riddle, MD;  Location[facility] ENDOSCOPY;  Service: Cardiovascular;  Laterality: N/A;  . Esophagogastroduodenoscopy  02/12/2012    Procedure: ESOPHAGOGASTRODUODENOSCOPY (EGD);  Surgeon: Beryle Beams, MD;  Location: [facility] ENDOSCOPY;  Service: Endoscopy;  Laterality: N/A;  . Tee without cardioversion N/A 07/15/2012    Procedure: TRANSESOPHAGEAL ECHOCARDIOGRAM (TEE);  Surgeon: Birdie Riddle, MD;  Location: [facility];  Service: Cardiovascular;  Laterality: N/A;  . Breast lumpectomy Left 1977    "  benign" (12/17/2012)  . Tonsillectomy  1940's  . Coronary artery bypass graft  2006  . 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   . Cardiac catheterization      "probably 2-3" (12/17/2012)  . Coronary angioplasty with stent placement      "several put in over the years" (12/17/2012)  . Cataract extraction w/ intraocular lens  implant, bilateral Bilateral 11/2000  . Peripherally inserted central catheter insertion Right 12/15/2012    "upper arm" (12/17/2012   Social History:  reports that she has never smoked. She has never used smokeless  tobacco. She reports that she does not drink alcohol or use illicit drugs. Where does patient live nursing home. Can patient participate in ADLs? No.  Allergies  Allergen Reactions  . Ceftriaxone     RASH BUT TOLERATED AMPICILLIN AND IMIPENEM WITHOUT PROBLEMS  . Codeine Nausea Only    Family History:  Family History  Problem Relation Age of Onset  . Heart disease Mother     CHF  . Mental illness Father     suicide  . Cancer Sister     pancreatic cancer  . COPD Brother       Prior to Admission medications   Medication Sig Start Date End Date Taking? Authorizing Provider  amiodarone (PACERONE) 200 MG tablet Take 1 tablet (200 mg total) by mouth daily. 11/13/13  Yes Hosie Poisson, MD  amoxicillin (AMOXIL) 500 MG capsule Take 1 capsule (500 mg total) by mouth every 12 (twelve) hours. 09/15/13  Yes Barton Dubois, MD  famotidine (PEPCID) 20 MG tablet Take 1 tablet (20 mg total) by mouth 2 (two) times daily. 10/27/13  Yes Estill Dooms, MD  feeding supplement, ENSURE COMPLETE, (ENSURE COMPLETE) LIQD Take 237 mLs by mouth 3 (three) times daily between meals. 11/19/13  Yes Kelvin Cellar, MD  furosemide (LASIX) 40 MG tablet Take 1 tablet (40 mg total) by mouth daily. 11/19/13  Yes Kelvin Cellar, MD  gabapentin (NEURONTIN) 300 MG capsule Take 1 capsule (300 mg total) by mouth at bedtime. 10/27/13  Yes Estill Dooms, MD  hydroxychloroquine (PLAQUENIL) 200 MG tablet Take 200 mg by mouth 2 (two) times daily.    Yes Historical Provider, MD  IRON PO Take 1 tablet by mouth 2 (two) times daily.   Yes Historical Provider, MD  metoprolol tartrate (LOPRESSOR) 25 MG tablet Take 25 mg by mouth daily.   Yes Historical Provider, MD  mirtazapine (REMERON) 7.5 MG tablet Take 1 tablet (7.5 mg total) by mouth at bedtime. For appetite 08/31/13  Yes Tiffany L Reed, DO  potassium chloride (KLOR-CON M15) 15 MEQ tablet Take 2 tablets (30 mEq total) by mouth 2 (two) times daily. 11/19/13  Yes Kelvin Cellar, MD   rosuvastatin (CRESTOR) 10 MG tablet Take 10 mg by mouth every morning.    Yes Historical Provider, MD  saccharomyces boulardii (FLORASTOR) 250 MG capsule Take 1 capsule (250 mg total) by mouth 2 (two) times daily. 08/31/13  Yes Tiffany L Reed, DO  vancomycin (VANCOCIN) 50 mg/mL oral solution Take 5 mLs (250 mg total) by mouth every 6 (six) hours. 11/19/13  Yes Kelvin Cellar, MD  warfarin (COUMADIN) 4 MG tablet Take 4 mg by mouth daily.   Yes Historical Provider, MD    Physical Exam: Filed Vitals:   11/27/13 2332 11/27/13 2342 11/28/13 0030 11/28/13 0038  BP:   100/62   Pulse:      Temp:  99 F (37.2 C)  98.9  F (37.2 C)  TempSrc:  Oral  Oral  Resp: 20 28  23   SpO2:  98%       General:  Moderately built and nourished.  Eyes: Anicteric no pallor.  ENT: No discharge from the ears eyes nose mouth.  Neck: No mass felt. JVD elevated.  Cardiovascular: S1-S2 heard. Tachycardic.  Respiratory: No rhonchi or crepitations.  Abdomen: Soft nontender bowel sounds present. No guarding or rigidity.  Skin: No rash.  Musculoskeletal: Mild edema bilaterally lower extremity.  Psychiatric: Appears normal.  Neurologic: Alert awake oriented to time place and person. Moves all extremities.  Labs on Admission:  Basic Metabolic Panel:  Recent Labs Lab 11/27/13 2108  NA 137  K 5.5*  CL 101  CO2 20  GLUCOSE 100*  BUN 19  CREATININE 1.48*  CALCIUM 9.1   Liver Function Tests:  Recent Labs Lab 11/27/13 2108  AST 30  ALT 17  ALKPHOS 225*  BILITOT 1.4*  PROT 7.0  ALBUMIN 2.8*   No results found for this basename: LIPASE, AMYLASE,  in the last 168 hours No results found for this basename: AMMONIA,  in the last 168 hours CBC:  Recent Labs Lab 11/27/13 2108  WBC 7.0  NEUTROABS 4.5  HGB 13.0  HCT 40.4  MCV 100.5*  PLT 117*   Cardiac Enzymes:  Recent Labs Lab 11/27/13 2111  TROPONINI <0.30    BNP (last 3 results)  Recent Labs  11/07/13 1000 11/14/13 0923  11/27/13 2111  PROBNP 24587.0* 13807.0* 7774.0*   CBG: No results found for this basename: GLUCAP,  in the last 168 hours  Radiological Exams on Admission: Ct Angio Chest Pe W/cm &/or Wo Cm  11/27/2013   CLINICAL DATA:  Respiratory distress.  Chest pain.  EXAM: CT ANGIOGRAPHY CHEST WITH CONTRAST  TECHNIQUE: Multidetector CT imaging of the chest was performed using the standard protocol during bolus administration of intravenous contrast. Multiplanar CT image reconstructions and MIPs were obtained to evaluate the vascular anatomy.  CONTRAST:  43mL OMNIPAQUE IOHEXOL 350 MG/ML SOLN  COMPARISON:  Chest radiograph November 27, 2013 and CT of the chest July 11, 2012.  FINDINGS: Adequate pulmonary arterial contrast opacification. Main pulmonary artery is enlarged, 3.9 cm. Nonocclusive segmental to subsegmental right lower lobe pulmonary eccentric arterial filling defect, appearing adherent and chronic though new from prior imaging (axial 136/306).  Heart size is moderate to severely enlarged, with dilated right ventricle. Reflux of contrast into the liver consistent with right heart failure. Aortic annular calcifications. No right heart strain. Coronary artery calcifications. No pericardial fluid collections. Thoracic aorta appears normal in course and caliber, mild calcific atherosclerosis. Limited assessment for lymphadenopathy due to bolus timing.  Patchy ground-glass opacities, small to moderate right and small left pleural effusion. Interlobular septal thickening in the lung bases. Tracheobronchial tree is patent and midline. No pneumothorax.  16 mm gallstone. Soft tissues are nonsuspicious. Status post median sternotomy. Mild thoracic spondylosis without destructive bony lesions.  Review of the MIP images confirms the above findings.  IMPRESSION: No convincing evidence of acute pulmonary embolism; nonocclusive right lower lobe segmental to subsegmental pulmonary embolus appears chronic. No right heart  strain. Similar enlarged pulmonary arteries suggesting sequelae of pulmonary arterial hypertension.  Cardiomegaly with evidence of right heart failure. Findings of acute and chronic pulmonary edema with small to moderate right and small left pleural effusions.   Electronically Signed   By: Elon Alas   On: 11/27/2013 23:54   Dg Chest Port 1 View  11/27/2013  CLINICAL DATA:  Chest pain.  EXAM: PORTABLE CHEST - 1 VIEW  COMPARISON:  11/16/2013  FINDINGS: Stable cardiomegaly status post prior CABG. There is some scarring and atelectasis at the right lung base. No overt pulmonary edema or pleural fluid. No pneumothorax.  IMPRESSION: Atelectasis and scarring at the right lung base. Stable cardiomegaly.   Electronically Signed   By: Aletta Edouard M.D.   On: 11/27/2013 21:38    EKG: Independently reviewed. A. fib with RVR. RBBB.  Assessment/Plan Principal Problem:   Atrial fibrillation with RVR Active Problems:   MIXED CONNECTIVE TISSUE DISEASE   Prosthetic valve endocarditis   CKD (chronic kidney disease) stage 3, GFR 30-59 ml/min   CHF (congestive heart failure)   Nausea & vomiting   1. Atrial fibrillation with RVR - probably precipitating CHF - patient was given one dose of IV Lasix in the ER. Closely observe respiratory status. I have ordered one dose of patient's metoprolol dose 25 mg now. If heart rate does not improve then may need to start on infusion of Cardizem or amiodarone. We need to make sure patient is not developing any sepsis or bacteremia. Blood cultures were sent and patient will be empirically placed on vancomycin and Zosyn for now. Patient's recent 2-D echo done on November 16, 2013 showed EF of 60-65%. For now and continue with by mouth Lasix. Patient is also on by mouth amiodarone. Patient's INR is subtherapeutic and I have placed patient on heparin until INR is therapeutic. 2. Nausea vomiting - abdomen appears benign. Closely follow LFTs. I have ordered KUB and sonogram of  abdomen. 3. Acute renal failure with mild hyperkalemia - check FeNa. Closely follow intake output and metabolic panel. Cause not clear.  4. CHF - see #1. 5. History of recurrent prosthetic aortic valve endocarditis - patient has been empirically placed on antibiotics for now. See #1. 6. Recently treated for CVA - patient presently is on heparin and Coumadin. 7. Hyperlipidemia - on statins. 8. History of lupus - on plaquenil  9. History of CAD status post CABG - denies any chest pain.    Code Status: Full code.  Family Communication: Patient's daughter at the bedside.  Disposition Plan: Admit to inpatient.    Asencion Guisinger N. [facility] Pager [phone].  If 7PM-7AM, please contact night-coverage [email or web address] Password [email or web address] 11/28/2013, 1:21 AM

## 2013-11-28 NOTE — Progress Notes (Signed)
ANTIBIOTIC CONSULT NOTE - INITIAL  Pharmacy Consult for Vancomycin and Zosyn  Indication: rule out sepsis  Allergies  Allergen Reactions  . Ceftriaxone     RASH BUT TOLERATED AMPICILLIN AND IMIPENEM WITHOUT PROBLEMS  . Codeine Nausea Only    Patient Measurements: Height: 5\' 7"  (170.2 cm) Weight: 167 lb 8.8 oz (76 kg) IBW/kg (Calculated) : 61.6  Vital Signs: Temp: 99.2 F (37.3 C) (09/05 0300) Temp src: Oral (09/05 0300) BP: 100/72 mmHg (09/05 0200) Pulse Rate: 109 (09/05 0300) Intake/Output from previous day:   Intake/Output from this shift:    Labs:  Recent Labs  11/27/13 2108  WBC 7.0  HGB 13.0  PLT 117*  CREATININE 1.48*   Estimated Creatinine Clearance: 34.9 ml/min (by C-G formula based on Cr of 1.48). No results found for this basename: VANCOTROUGH, Corlis Leak, VANCORANDOM, [place], GENTPEAK, GENTRANDOM, [place], TOBRAPEAK, TOBRARND, AMIKACINPEAK, AMIKACINTROU, AMIKACIN,  in the last 72 hours   Microbiology: Recent Results (from the past 720 hour(s))  URINE CULTURE     Status: None   Collection Time    11/07/13  7:56 AM      Result Value Ref Range Status   Specimen Description URINE, CATHETERIZED   Final   Special Requests NONE   Final   Culture  Setup Time     Final   Value: 11/07/2013 18:34     Performed at [facility]     Final   Value: NO GROWTH     Performed at [facility]   Culture     Final   Value: NO GROWTH     Performed at [facility]   Report Status 11/08/2013 FINAL   Final  CULTURE, BLOOD (ROUTINE X 2)     Status: None   Collection Time    11/07/13  8:20 AM      Result Value Ref Range Status   Specimen Description BLOOD LEFT ARM   Final   Special Requests BOTTLES DRAWN AEROBIC ONLY 4CC   Final   Culture  Setup Time     Final   Value: 11/07/2013 18:00     Performed at [facility]   Culture     Final   Value: NO GROWTH 5 DAYS     Performed at [facility]   Report  Status 11/13/2013 FINAL   Final  CULTURE, BLOOD (ROUTINE X 2)     Status: None   Collection Time    11/07/13  8:27 AM      Result Value Ref Range Status   Specimen Description BLOOD LEFT HAND   Final   Special Requests BOTTLES DRAWN AEROBIC ONLY 3CC   Final   Culture  Setup Time     Final   Value: 11/07/2013 18:00     Performed at [facility]   Culture     Final   Value: NO GROWTH 5 DAYS     Performed at [facility]   Report Status 11/13/2013 FINAL   Final  MRSA PCR SCREENING     Status: None   Collection Time    11/07/13  2:09 PM      Result Value Ref Range Status   MRSA by PCR NEGATIVE  NEGATIVE Final   Comment:            The GeneXpert MRSA Assay (FDA     approved for NASAL specimens     only), is one component of a  comprehensive MRSA colonization     surveillance program. It is not     intended to diagnose MRSA     infection nor to guide or     monitor treatment for     MRSA infections.  CULTURE, BLOOD (ROUTINE X 2)     Status: None   Collection Time    11/14/13  9:48 AM      Result Value Ref Range Status   Specimen Description BLOOD LEFT HAND   Final   Special Requests BOTTLES DRAWN AEROBIC ONLY 5ML   Final   Culture  Setup Time     Final   Value: 11/14/2013 20:00     Performed at [facility]   Culture     Final   Value: NO GROWTH 5 DAYS     Performed at [facility]   Report Status 11/20/2013 FINAL   Final  CULTURE, BLOOD (ROUTINE X 2)     Status: None   Collection Time    11/14/13 11:45 AM      Result Value Ref Range Status   Specimen Description BLOOD RIGHT HAND   Final   Special Requests BOTTLES DRAWN AEROBIC AND ANAEROBIC B 5CC R 4CC   Final   Culture  Setup Time     Final   Value: 11/15/2013 00:09     Performed at [facility]   Culture     Final   Value: NO GROWTH 5 DAYS     Performed at [facility]   Report Status 11/21/2013 FINAL   Final  CLOSTRIDIUM DIFFICILE BY PCR     Status: Abnormal    Collection Time    11/15/13  2:34 PM      Result Value Ref Range Status   C difficile by pcr POSITIVE (*) NEGATIVE Final   Comment: CRITICAL RESULT CALLED TO, READ BACK BY AND VERIFIED WITH:     LATOYA SILVA 1615 BY [place] 11/15/13   Assessment: 73 yo female with h/o endocarditis, possible sepsis, for empiric antibiotics  Goal of Therapy:  Vancomycin trough level 15-20 mcg/ml  Plan:  Vancomycin 1250 mg IV now, then 750 mg IV q24h Zosyn 3.375 g IV q8h   Caryl Pina 11/28/2013,3:12 AM

## 2013-11-28 NOTE — Progress Notes (Signed)
Peripherally Inserted Central Catheter/Midline Placement  The IV Nurse has discussed with the patient and/or persons authorized to consent for the patient, the purpose of this procedure and the potential benefits and risks involved with this procedure.  The benefits include less needle sticks, lab draws from the catheter and patient may be discharged home with the catheter.  Risks include, but not limited to, infection, bleeding, blood clot (thrombus formation), and puncture of an artery; nerve damage and irregular heat beat.  Alternatives to this procedure were also discussed.  Consent obtained by Jake Samples, RN  PICC/Midline Placement Documentation        [name], [name] 11/28/2013, 10:56 AM

## 2013-11-29 LAB — CBC
HCT: 36.9 % (ref 36.0–46.0)
Hemoglobin: 11.8 g/dL — ABNORMAL LOW (ref 12.0–15.0)
MCH: 32 pg (ref 26.0–34.0)
MCHC: 32 g/dL (ref 30.0–36.0)
MCV: 100 fL (ref 78.0–100.0)
PLATELETS: 103 10*3/uL — AB (ref 150–400)
RBC: 3.69 MIL/uL — AB (ref 3.87–5.11)
RDW: 16.4 % — AB (ref 11.5–15.5)
WBC: 3.4 10*3/uL — ABNORMAL LOW (ref 4.0–10.5)

## 2013-11-29 LAB — PROTIME-INR
INR: 2.29 — AB (ref 0.00–1.49)
Prothrombin Time: 25.2 seconds — ABNORMAL HIGH (ref 11.6–15.2)

## 2013-11-29 NOTE — Progress Notes (Signed)
[facility] PROGRESS NOTE  [name] ZOX:[id number] DOB: [birth year] DOA: 11/27/2013 PCP: Hollace Kinnier, DO  Assessment/Plan:  Principal Problem:   Atrial fibrillation with RVR - resolved and currently rate controlled on amiodarone, metoprolol - Continue coumadin, pharmacy to dose.  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    CHF (congestive heart failure) - Continue home regimen. Currently compensated.    Nausea & vomiting - resolved, continue zofran prn  Code Status: full Family Communication: none at bedside. Disposition Plan: Pending improvement in condition.   Consultants:  None  Procedures:  none  Antibiotics:  Zosyn and Vancomycin  HPI/Subjective: Pt feels better today. No new complaints.  Objective: Filed Vitals:   11/29/13 1114  BP: 108/67  Pulse:   Temp: 97.7 F (36.5 C)  Resp: 20    Intake/Output Summary (Last 24 hours) at 11/29/13 1400 Last data filed at 11/29/13 0900  Gross per 24 hour  Intake    709 ml  Output    575 ml  Net    134 ml   Filed Weights   11/28/13 0227 11/28/13 0300 11/29/13 0457  Weight: 78 kg (171 lb 15.3 oz) 76 kg (167 lb 8.8 oz) 76.2 kg (167 lb 15.9 oz)    Exam:   General:  Pt in nad, alert and awake  Cardiovascular: irregularly irregular, no rubs  Respiratory: CTA BL, no wheezes  Abdomen: soft, NT, ND  Musculoskeletal: no cyanosis or clubbing   Data Reviewed: Basic Metabolic Panel:  Recent Labs Lab 11/27/13 2108 11/28/13 0550  NA 137 135*  K 5.5* 5.2  CL 101 102  CO2 20 16*  GLUCOSE 100* 89  BUN 19 21  CREATININE 1.48* 1.54*  CALCIUM 9.1 8.8   Liver Function Tests:  Recent Labs Lab 11/27/13 2108 11/28/13 0550  AST 30 26  ALT 17  15  ALKPHOS 225* 184*  BILITOT 1.4* 1.5*  PROT 7.0 6.1  ALBUMIN 2.8* 2.5*   No results found for this basename: LIPASE, AMYLASE,  in the last 168 hours No results found for this basename: AMMONIA,  in the last 168 hours CBC:  Recent Labs Lab 11/27/13 2108 11/28/13 0550 11/29/13 0500  WBC 7.0 4.9 3.4*  NEUTROABS 4.5 3.1  --   HGB 13.0 11.5* 11.8*  HCT 40.4 34.3* 36.9  MCV 100.5* 96.6 100.0  PLT 117* 99* 103*   Cardiac Enzymes:  Recent Labs Lab 11/27/13 2111 11/28/13 0550 11/28/13 0900 11/28/13 1525  TROPONINI <0.30 <0.30 <0.30 <0.30   BNP (last 3 results)  Recent Labs  11/07/13 1000 11/14/13 0923 11/27/13 2111  PROBNP 24587.0* 13807.0* 7774.0*   CBG:  Recent Labs Lab 11/28/13 0255  GLUCAP 90    Recent Results (from the past 240 hour(s))  CULTURE, BLOOD (ROUTINE X 2)     Status: None   Collection Time    11/27/13  9:04 PM      Result Value Ref Range Status   Specimen Description BLOOD LEFT ARM   Final   Special Requests     Final   Value: BOTTLES DRAWN AEROBIC AND ANAEROBIC 8CC BLUE,6CC RED   Culture  Setup Time     Final   Value: 11/28/2013 10:18     Performed at [facility]  Culture     Final   Value:        BLOOD CULTURE RECEIVED NO GROWTH TO DATE CULTURE WILL BE HELD FOR 5 DAYS BEFORE ISSUING A FINAL NEGATIVE REPORT     Performed at [facility]   Report Status PENDING   Incomplete  CULTURE, BLOOD (ROUTINE X 2)     Status: None   Collection Time    11/27/13 10:02 PM      Result Value Ref Range Status   Specimen Description BLOOD LEFT HAND   Final   Special Requests BOTTLES DRAWN AEROBIC ONLY 5CC   Final   Culture  Setup Time     Final   Value: 11/28/2013 10:17     Performed at [facility]   Culture     Final   Value:        BLOOD CULTURE RECEIVED NO GROWTH TO DATE CULTURE WILL BE HELD FOR 5 DAYS BEFORE ISSUING A FINAL NEGATIVE REPORT     Performed at [facility]   Report Status PENDING   Incomplete   MRSA PCR SCREENING     Status: None   Collection Time    11/28/13  2:46 AM      Result Value Ref Range Status   MRSA by PCR NEGATIVE  NEGATIVE Final   Comment:            The GeneXpert MRSA Assay (FDA     approved for NASAL specimens     only), is one component of a     comprehensive MRSA colonization     surveillance program. It is not     intended to diagnose MRSA     infection nor to guide or     monitor treatment for     MRSA infections.     Studies: Dg Abd 1 View  11/28/2013   CLINICAL DATA:  Nausea and vomiting.  EXAM: ABDOMEN - 1 VIEW  COMPARISON:  CT abdomen and pelvis 08/03/2013  FINDINGS: The bowel gas pattern is normal. No radio-opaque calculi or other significant radiographic abnormality are seen. Residual contrast material in the bladder.  IMPRESSION: Nonobstructive bowel gas pattern.   Electronically Signed   By: Lucienne Capers M.D.   On: 11/28/2013 05:06   Ct Angio Chest Pe W/cm &/or Wo Cm  11/27/2013   CLINICAL DATA:  Respiratory distress.  Chest pain.  EXAM: CT ANGIOGRAPHY CHEST WITH CONTRAST  TECHNIQUE: Multidetector CT imaging of the chest was performed using the standard protocol during bolus administration of intravenous contrast. Multiplanar CT image reconstructions and MIPs were obtained to evaluate the vascular anatomy.  CONTRAST:  50mL OMNIPAQUE IOHEXOL 350 MG/ML SOLN  COMPARISON:  Chest radiograph November 27, 2013 and CT of the chest July 11, 2012.  FINDINGS: Adequate pulmonary arterial contrast opacification. Main pulmonary artery is enlarged, 3.9 cm. Nonocclusive segmental to subsegmental right lower lobe pulmonary eccentric arterial filling defect, appearing adherent and chronic though new from prior imaging (axial 136/306).  Heart size is moderate to severely enlarged, with dilated right ventricle. Reflux of contrast into the liver consistent with right heart failure. Aortic annular calcifications. No right heart strain. Coronary artery calcifications. No  pericardial fluid collections. Thoracic aorta appears normal in course and caliber, mild calcific atherosclerosis. Limited assessment for lymphadenopathy due to bolus timing.  Patchy ground-glass opacities, small to moderate right and small left pleural effusion. Interlobular septal thickening in the lung bases. Tracheobronchial tree is patent and midline. No pneumothorax.  16 mm gallstone. Soft tissues are nonsuspicious. Status post median sternotomy. Mild thoracic spondylosis without destructive bony lesions.  Review of the MIP images confirms the above findings.  IMPRESSION: No convincing evidence of acute pulmonary embolism; nonocclusive right lower lobe segmental to subsegmental pulmonary embolus appears chronic. No right heart strain. Similar enlarged pulmonary arteries suggesting sequelae of pulmonary arterial hypertension.  Cardiomegaly with evidence of right heart failure. Findings of acute and chronic pulmonary edema with small to moderate right and small left pleural effusions.   Electronically Signed   By: Elon Alas   On: 11/27/2013 23:54   US Abdomen Complete  11/28/2013   CLINICAL DATA:  Nausea/vomiting  EXAM: ULTRASOUND ABDOMEN COMPLETE  COMPARISON:  11/07/2013  FINDINGS: Gallbladder:  Underdistended with mild secondary prominence of the gallbladder wall. 2.1 cm gallstone. No pericholecystic fluid. Murphy's sign could not be assessed due to medication.  Common bile duct:  Diameter: 5 mm.  Liver:  No focal lesion identified. Within normal limits in parenchymal echogenicity. Trace perihepatic fluid.  IVC:  No abnormality visualized.  Pancreas:  Poorly visualized.  Spleen:  Enlarged, measuring 14.4 cm.  Right Kidney:  Length: 10.0 cm. Echogenic renal parenchyma with cortical atrophy. 4 mm lower pole calculus. 2.4 x 2.0 x 2.0 cm lower pole cyst. No hydronephrosis.  Left Kidney:  Length: 12.9 cm. Echogenic renal parenchyma. 8 x 9 x 8 mm upper pole cyst. No hydronephrosis.  Abdominal aorta:  No  aneurysm visualized.  Other findings:  Small right pleural effusion.  IMPRESSION: Cholelithiasis, without associated findings suggest acute cholecystitis.  Splenomegaly.  Echogenic renal parenchyma, suggesting medical renal disease. Bilateral renal cysts. 4 mm right lower pole renal calculus, without hydronephrosis.  Small right pleural effusion.   Electronically Signed   By: Julian Hy M.D.   On: 11/28/2013 07:23   Dg Chest Port 1 View  11/28/2013   CLINICAL DATA:  Labored breathing. History of congestive heart failure, hypertension, lupus  EXAM: PORTABLE CHEST - 1 VIEW  COMPARISON:  11/27/2013; 11/16/2013; chest CT -11/27/2013  FINDINGS: Grossly unchanged enlarged cardiac silhouette and mediastinal contours post median sternotomy. Exuberant calcifications within the mitral valve annulus. Interval placement of right upper extremity approach PICC line with tip projected over the superior cavoatrial junction. Chronic findings of pulmonary venous congestion. Minimally improved aeration of lungs with persistent bibasilar opacities and linear heterogeneous opacities in the peripheral aspect the right mid lung. No new focal airspace opacities chronic trace right-sided effusion is suspected. No pneumothorax. Unchanged bones.  IMPRESSION: 1. Similar findings of cardiomegaly and pulmonary venous congestion without acute cardiopulmonary disease. 2. Unchanged trace right-sided pleural effusion. 3. Minimally improved aeration of the lungs with persistent bibasilar opacities, likely atelectasis.   Electronically Signed   By: Sandi Mariscal M.D.   On: 11/28/2013 12:11   Dg Chest Port 1 View  11/27/2013   CLINICAL DATA:  Chest pain.  EXAM: PORTABLE CHEST - 1 VIEW  COMPARISON:  11/16/2013  FINDINGS: Stable cardiomegaly status post prior CABG. There is some scarring and atelectasis at the right lung base. No overt pulmonary edema or pleural fluid. No pneumothorax.  IMPRESSION: Atelectasis and scarring at the right lung  base. Stable cardiomegaly.   Electronically Signed   By: Aletta Edouard M.D.   On: 11/27/2013 21:38    Scheduled Meds: . amiodarone  200 mg Oral Daily  . atorvastatin  20 mg Oral q1800  . famotidine  20 mg Oral BID  . feeding supplement (ENSURE COMPLETE)  237 mL  Oral TID BM  . furosemide  40 mg Oral Daily  . gabapentin  300 mg Oral QHS  . hydroxychloroquine  200 mg Oral BID  . metoprolol tartrate  25 mg Oral Daily  . mirtazapine  7.5 mg Oral QHS  . piperacillin-tazobactam (ZOSYN)  IV  3.375 g Intravenous 3 times per day  . potassium chloride  30 mEq Oral BID  . saccharomyces boulardii  250 mg Oral BID  . sodium chloride  10-40 mL Intracatheter Q12H  . sodium chloride  3 mL Intravenous Q12H  . vancomycin  250 mg Oral 4 times per day  . vancomycin  750 mg Intravenous Q24H  . warfarin  4 mg Oral q1800  . Warfarin - Pharmacist Dosing Inpatient   Does not apply q1800   Continuous Infusions:    Time spent: > 35 minutes    Velvet Bathe  [facility] Pager [phone] If 7PM-7AM, please contact night-coverage at [email or web address], password [facility] 11/29/2013, 2:00 PM  LOS: 2 days

## 2013-11-29 NOTE — Progress Notes (Signed)
[place] for Coumadin Indication: atrial fibrillation  Allergies  Allergen Reactions  . Ceftriaxone     RASH BUT TOLERATED AMPICILLIN AND IMIPENEM WITHOUT PROBLEMS  . Codeine Nausea Only    Patient Measurements: Height: 5\' 7"  (170.2 cm) Weight: 167 lb 15.9 oz (76.2 kg) IBW/kg (Calculated) : 61.6  Vital Signs: Temp: 98.3 F (36.8 C) (09/06 0457) Temp src: Oral (09/06 0457) BP: 104/44 mmHg (09/06 0457) Pulse Rate: 104 (09/06 0457)  Labs:  Recent Labs  11/27/13 2108  11/28/13 0550 11/28/13 0900 11/28/13 1300 11/28/13 1525 11/29/13 0500  HGB 13.0  --  11.5*  --   --   --  11.8*  HCT 40.4  --  34.3*  --   --   --  36.9  PLT 117*  --  99*  --   --   --  103*  LABPROT 21.7*  --   --   --  24.4*  --  25.2*  INR 1.89*  --   --   --  2.20*  --  2.29*  CREATININE 1.48*  --  1.54*  --   --   --   --   TROPONINI  --   < > <0.30 <0.30  --  <0.30  --   < > = values in this interval not displayed.  Estimated Creatinine Clearance: 33.6 ml/min (by C-G formula based on Cr of 1.54).  Medications:  Amiodarone  Amoxicillin  Pepcid  Lasix  Neurontin  Plaquenil  Lopressor  Remeron  KCl  Crestor  FLorastor  Vancomycin   Coumadin 4 mg daily  Assessment: 73 yo female with Afib, INR continues to be at goal (2.29), heparin stopped yesterday. No bleeding issues noted, cbc stable. Patient restarted on home dose of warfarin.  Goal of Therapy:  INR goal 2-3 Monitor platelets by anticoagulation protocol: Yes   Plan:  Warfarin 4mg  daily MWF INR check  Erin Hearing PharmD., BCPS Clinical Pharmacist Pager [phone] 11/29/2013 8:12 AM

## 2013-11-30 DIAGNOSIS — A419 Sepsis, unspecified organism: Secondary | ICD-10-CM

## 2013-11-30 DIAGNOSIS — K2970 Gastritis, unspecified, without bleeding: Secondary | ICD-10-CM

## 2013-11-30 DIAGNOSIS — I359 Nonrheumatic aortic valve disorder, unspecified: Secondary | ICD-10-CM

## 2013-11-30 DIAGNOSIS — K2990 Gastroduodenitis, unspecified, without bleeding: Secondary | ICD-10-CM

## 2013-11-30 LAB — BASIC METABOLIC PANEL WITH GFR
Anion gap: 11 (ref 5–15)
BUN: 20 mg/dL (ref 6–23)
CO2: 22 meq/L (ref 19–32)
Calcium: 8.5 mg/dL (ref 8.4–10.5)
Chloride: 105 meq/L (ref 96–112)
Creatinine, Ser: 1.59 mg/dL — ABNORMAL HIGH (ref 0.50–1.10)
GFR calc Af Amer: 36 mL/min — ABNORMAL LOW
GFR calc non Af Amer: 31 mL/min — ABNORMAL LOW
Glucose, Bld: 90 mg/dL (ref 70–99)
Potassium: 4.6 meq/L (ref 3.7–5.3)
Sodium: 138 meq/L (ref 137–147)

## 2013-11-30 LAB — PROTIME-INR
INR: 2.79 — ABNORMAL HIGH (ref 0.00–1.49)
Prothrombin Time: 29.4 s — ABNORMAL HIGH (ref 11.6–15.2)

## 2013-11-30 LAB — CLOSTRIDIUM DIFFICILE BY PCR: Toxigenic C. Difficile by PCR: NEGATIVE

## 2013-11-30 MED ORDER — DRONABINOL 2.5 MG PO CAPS
2.5000 mg | ORAL_CAPSULE | Freq: Two times a day (BID) | ORAL | Status: DC
  Administered 2013-12-01 – 2013-12-03 (×6): 2.5 mg via ORAL
  Filled 2013-11-30 (×7): qty 1

## 2013-11-30 NOTE — Evaluation (Signed)
Physical Therapy Evaluation Patient Details Name: [name] MRN: [id number] DOB: 1940-09-03 Today's Date: 11/30/2013   History of Present Illness  [name] is a 73 y.o. female with history of recurrent endocarditis, chronic atrial fibrillation, bioprosthetic aortic valve, CHF, recently admitted for CVA, recently treated for C. difficile colitis and is on oral vancomycin, CAD status post CABG, lupus was brought to the ER because of nausea and vomiting  Clinical Impression  Pt pleasant with decreased memory, processing and problem solving. Pt was reportedly walking prior to 8/20 and has not been ambulating since D/C to SNF. Pt very weak with assist needed for all mobility and transfers and pt will benefit from acute therapy to maximize mobility, function, balance and safety. Recommend OOB daily with nursing staff.      Follow Up Recommendations SNF;Supervision/Assistance - 24 hour    Equipment Recommendations  None recommended by PT    Recommendations for Other Services OT consult     Precautions / Restrictions Precautions Precautions: Fall Precaution Comments: watch O2 sats      Mobility  Bed Mobility Overal bed mobility: Needs Assistance Bed Mobility: Rolling;Sidelying to Sit Rolling: Min assist Sidelying to sit: Mod assist       General bed mobility comments: cues for sequence, assist to complete transfers with mod assist and pad for reciprocal scooting to edge of bed and back in chair  Transfers     Transfers: Sit to/from Stand;Stand Pivot Transfers Sit to Stand: Max assist Stand pivot transfers: Max assist       General transfer comment: pt maintaining flexed posture with cues for hand placement, assist for anterior translation with standing x 2 then max assist to pivot pelvis to chair  Ambulation/Gait                Stairs            Wheelchair Mobility    Modified Rankin (Stroke Patients Only)       Balance Overall balance  assessment: Needs assistance   Sitting balance-Leahy Scale: Poor Sitting balance - Comments: pt with posterior lean with min assist to maintain midline x 4 min Postural control: Posterior lean   Standing balance-Leahy Scale: Poor Standing balance comment: flexed posture with anterior lean in standing                             Pertinent Vitals/Pain Pain Assessment: No/denies pain HR 100 Unable to get pulse ox reading, on 3L throughout    Home Living Family/patient expects to be discharged to:: [name]: Gilford Rile - 2 wheels;Bedside commode;Wheelchair - manual;Shower seat - built in;Grab bars - toilet;Grab bars - tub/shower      Prior Function Level of Independence: Needs assistance   Gait / Transfers Assistance Needed: 1 person assist per pt for OOB and transfer to chair since D/C to SNF, pt reports prior to 8/20 was mod I  ADL's / Homemaking Assistance Needed: pt reports she sponge bathes on her own  Comments: Pt was at home with RW ambulating until last hospitalization on 8/22 at which time she D/Cd to SNF and has not been walking     Hand Dominance        Extremity/Trunk Assessment   Upper Extremity Assessment: Generalized weakness           Lower Extremity Assessment: Generalized  weakness      Cervical / Trunk Assessment: Kyphotic  Communication   Communication: No difficulties  Cognition Arousal/Alertness: Awake/alert Behavior During Therapy: Flat affect Overall Cognitive Status: Impaired/Different from baseline Area of Impairment: Problem solving;Safety/judgement   Current Attention Level: Focused Memory: Decreased short-term memory Following Commands: Follows one step commands consistently Safety/Judgement: Decreased awareness of deficits   Problem Solving: Slow processing;Difficulty sequencing;Requires verbal cues;Requires tactile cues      General Comments      Exercises         Assessment/Plan    PT Assessment Patient needs continued PT services  PT Diagnosis Difficulty walking;Generalized weakness;Altered mental status   PT Problem List Decreased strength;Decreased activity tolerance;Decreased balance;Decreased mobility;Decreased coordination;Decreased cognition;Decreased safety awareness  PT Treatment Interventions DME instruction;Gait training;Functional mobility training;Therapeutic activities;Therapeutic exercise;Balance training;Cognitive remediation;Patient/family education   PT Goals (Current goals can be found in the Care Plan section) Acute Rehab PT Goals Patient Stated Goal: be able to walk and read my books PT Goal Formulation: With patient Time For Goal Achievement: 12/14/13 Potential to Achieve Goals: Fair    Frequency Min 2X/week   Barriers to discharge   this makes 3rd admission in a month    Co-evaluation               End of Session Equipment Utilized During Treatment: Gait belt;Oxygen Activity Tolerance: Patient tolerated treatment well Patient left: in chair;with call bell/phone within reach Nurse Communication: Mobility status;Precautions         Time: 5449-2010 PT Time Calculation (min): 16 min   Charges:   PT Evaluation $Initial PT Evaluation Tier I: 1 Procedure PT Treatments $Therapeutic Exercise: 8-22 mins   PT G Codes:          Melford Aase 11/30/2013, 12:59 PM Elwyn Reach, [place]

## 2013-11-30 NOTE — Progress Notes (Addendum)
Pharmacy CONSULT NOTE   Pharmacy Consult for Coumadin Indication: atrial fibrillation  Allergies  Allergen Reactions  . Ceftriaxone     RASH BUT TOLERATED AMPICILLIN AND IMIPENEM WITHOUT PROBLEMS  . Codeine Nausea Only    Patient Measurements: Height: 5\' 7"  (170.2 cm) Weight: 175 lb 14.8 oz (79.8 kg) IBW/kg (Calculated) : 61.6  Vital Signs: Temp: 97.5 F (36.4 C) (09/07 0700) Temp src: Oral (09/07 0700) BP: 108/53 mmHg (09/07 0800) Pulse Rate: 117 (09/07 0800)  Labs:  Recent Labs  11/27/13 2108  11/28/13 0550 11/28/13 0900 11/28/13 1300 11/28/13 1525 11/29/13 0500 11/30/13 0608  HGB 13.0  --  11.5*  --   --   --  11.8*  --   HCT 40.4  --  34.3*  --   --   --  36.9  --   PLT 117*  --  99*  --   --   --  103*  --   LABPROT 21.7*  --   --   --  24.4*  --  25.2* 29.4*  INR 1.89*  --   --   --  2.20*  --  2.29* 2.79*  CREATININE 1.48*  --  1.54*  --   --   --   --  1.59*  TROPONINI  --   < > <0.30 <0.30  --  <0.30  --   --   < > = values in this interval not displayed.  Estimated Creatinine Clearance: 33.3 ml/min (by C-G formula based on Cr of 1.59).  Medications:  Amiodarone  Amoxicillin  Pepcid  Lasix  Neurontin  Plaquenil  Lopressor  Remeron  KCl  Crestor  FLorastor  Vancomycin   Coumadin 4 mg daily  Assessment: 73 yo female with Afib, INR continues to be at goal (2.79) up this am. No bleeding issues noted, cbc stable. Patient restarted on home dose of warfarin.  Amiodarone 200mg  daily -home med, no other drug interactions seen.  Hx recurrent endocarditis now with possible sepsis, currently on day #2 of vancomycin and zosyn. No fevers noted, last wbc low at 3.4, renal function stable.   9/4 bld x2 - ngtd  Goal of Therapy:  INR goal 2-3 Monitor platelets by anticoagulation protocol: Yes   Plan:  Continue Warfarin 4mg  daily Daily INR check Check vancomycin trough 1-2 days Continue zosyn 3.375g IV q8 horus  Erin Hearing PharmD., BCPS Clinical  Pharmacist Pager [phone] 11/30/2013 8:35 AM

## 2013-11-30 NOTE — Clinical Documentation Improvement (Addendum)
"  CHF" documented in current record.  Treated with Lasix 40 mg IV x 1 in ED and Lasix 40 mg PO since.  11/28/13 to 11/29/13 I&O - 878 IN, 575 OUT.  Most recent Echo 11/16/13, see report in [facility].  Please document the Acuity and Type of Heart Failure in the progress notes and discharge summary:  ACUITY:  - Acute  - Chronic  - Acute on Chronic  And   TYPE:  - Systolic  - Diastolic  - Combined  Thank You, Erling Conte ,RN Clinical Documentation Specialist:  [phone] [facility]

## 2013-11-30 NOTE — Clinical Documentation Improvement (Signed)
  Please review Registered Dietician Initial Assessment dated 11/28/2013 at 11:41 by Molli Barrows.  If you agree with the RD's assessment, please document the associated diagnosis in the progress notes and discharge summary:   - severe protein calorie malnutrition   - other type of malnutrition   - unable to clinically determine   - diagnosis is not applicable  Thank You, Erling Conte ,RN Clinical Documentation Specialist:  [phone] [place] [facility]

## 2013-11-30 NOTE — Consult Note (Signed)
[facility]  Date of Admission:  11/27/2013  Date of Consult:  11/30/2013  Reason for Consult: endocarditis, repeated episodes of sepsis Referring Physician: Wendee Beavers  Impression/Recommendation Gastritis Prev enterococcal bacteremia Prev CVA  Stool pathogens panel Repeat C diff if she moves her bowels Stop IV vanco.  Await her BCx  Comment- I am not clear that this is related to her previous IE. She was not felt to be a surgical candidate previously.  I would plan for her to continue her po amoxil at d/c.   Thank you so much for this interesting consult,   Bobby Rumpf (pager) [phone] [email or web address]  [name] is an 73 y.o. female.  HPI: 73 yo F with hx of SLE, enterococcal prosthetic aortic valve endocarditis who has been maintained on suppressive oral amoxil. She was in hospital again 10-2013 with new CVA. Also recent hx of C diff (09-14-13, 11-15-13, still on oral vanco?).  SHe returns to ED 9-5 with n/v from SNF. By her hx she had f/c prior to arrival. WBC normal , afebrile. She had CT that showed chronic PE but no acute. She was found to have ARF (0.81 ---> 1.59). She was started on vanco/zosyn.  She has been afebrile, WBC normal. By her hx, she has had 1 BM in 3 days. Per nsg- bm on 9-5, bm x2 9-6.   BCx- 12-09-12- Enterococcal sp. Modesta Messing)  Past Medical History  Diagnosis Date  . Coronary artery disease   . Atrial fibrillation     Now NSR  . GERD (gastroesophageal reflux disease)   . Hyperlipidemia   . 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 Anemia   . Angina   . Hypertension   . Blood transfusion     "related to bad case of bronchitis once; 1 yr ago given due to GIB" (12/17/2012)  . H/O hiatal hernia   . Lupus     "that's compromised her lungs somewhat" (12/17/2012)  . Insomnia   . Endocarditis   . Prosthetic valve endocarditis   . Enterococcal bacteremia   . Heart murmur   . CHF (congestive  heart failure)   . Pneumonia     "once or twice" (12/17/2012)  . Chronic bronchitis     "used to get it once/yr; hasn't had it since ~ 1986" (12/17/2012)  . Shortness of breath     "all the times sometimes" (12/17/2012)  . On home oxygen therapy     "2L prn; always at night" (12/17/2012)  . Lower GI bleed ~ 01/2012  . Arthritis     "hands" (12/17/2012)  . 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 Right bundle branch block 11/07/2013    Past Surgical History  Procedure Laterality Date  . Givens capsule study  04/09/2011    Procedure: GIVENS CAPSULE STUDY;  Surgeon: Beryle Beams, MD;  Location: [facility];  Service: Endoscopy;  Laterality: N/A;  . Tee without cardioversion  02/06/2012    Procedure: TRANSESOPHAGEAL ECHOCARDIOGRAM (TEE);  Surgeon: Birdie Riddle, MD;  Location: [facility] ENDOSCOPY;  Service: Cardiovascular;  Laterality: N/A;  . Esophagogastroduodenoscopy  02/12/2012    Procedure: ESOPHAGOGASTRODUODENOSCOPY (EGD);  Surgeon: Beryle Beams, MD;  Location: [facility] ENDOSCOPY;  Service: Endoscopy;  Laterality: N/A;  . Tee without cardioversion N/A 07/15/2012    Procedure: TRANSESOPHAGEAL ECHOCARDIOGRAM (TEE);  Surgeon: Birdie Riddle, MD;  Location: [facility];  Service: Cardiovascular;  Laterality: N/A;  . Breast lumpectomy Left 1977    "benign" (12/17/2012)  . Tonsillectomy  1940's  . Coronary artery bypass graft  2006  . 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   . Cardiac catheterization      "probably 2-3" (12/17/2012)  . Coronary angioplasty with stent placement      "several put in over the years" (12/17/2012)  . Cataract extraction w/ intraocular lens  implant, bilateral Bilateral 11/2000  . Peripherally inserted central catheter insertion Right 12/15/2012    "upper arm" (12/17/2012     Allergies  Allergen Reactions  . Ceftriaxone     RASH BUT TOLERATED AMPICILLIN AND IMIPENEM WITHOUT PROBLEMS  . Codeine Nausea Only     Medications:  Scheduled: . amiodarone  200 mg Oral Daily  . atorvastatin  20 mg Oral q1800  . famotidine  20 mg Oral BID  . feeding supplement (ENSURE COMPLETE)  237 mL Oral TID BM  . furosemide  40 mg Oral Daily  . gabapentin  300 mg Oral QHS  . hydroxychloroquine  200 mg Oral BID  . metoprolol tartrate  25 mg Oral Daily  . mirtazapine  7.5 mg Oral QHS  . piperacillin-tazobactam (ZOSYN)  IV  3.375 g Intravenous 3 times per day  . potassium chloride  30 mEq Oral BID  . saccharomyces boulardii  250 mg Oral BID  . sodium chloride  10-40 mL Intracatheter Q12H  . sodium chloride  3 mL Intravenous Q12H  . vancomycin  250 mg Oral 4 times per day  . vancomycin  750 mg Intravenous Q24H  . warfarin  4 mg Oral q1800  . Warfarin - Pharmacist Dosing Inpatient   Does not apply q1800    Abtx:  Anti-infectives   Start     Dose/Rate Route Frequency Ordered Stop   11/29/13 0600  vancomycin (VANCOCIN) IVPB 750 mg/150 ml premix     750 mg 150 mL/hr over 60 Minutes Intravenous Every 24 hours 11/28/13 0315     11/28/13 0600  vancomycin (VANCOCIN) 50 mg/mL oral solution 250 mg     250 mg Oral 4 times per day 11/28/13 0259     11/28/13 0400  piperacillin-tazobactam (ZOSYN) IVPB 3.375 g     3.375 g 12.5 mL/hr over 240 Minutes Intravenous 3 times per day 11/28/13 0316     11/28/13 0345  amoxicillin (AMOXIL) capsule 500 mg  Status:  Discontinued     500 mg Oral Every 12 hours 11/28/13 0259 11/28/13 0316   11/28/13 0345  hydroxychloroquine (PLAQUENIL) tablet 200 mg     200 mg Oral 2 times daily 11/28/13 0259     11/28/13 0330  vancomycin (VANCOCIN) 1,250 mg in sodium chloride 0.9 % 250 mL IVPB     1,250 mg 166.7 mL/hr over 90 Minutes Intravenous  Once 11/28/13 0315 11/28/13 0646   11/28/13 0000  levofloxacin (LEVAQUIN) IVPB 750 mg  Status:  Discontinued     750 mg 100 mL/hr over 90 Minutes Intravenous  Once 11/27/13 2356 11/28/13 0015      Total days of antibiotics: 3  (vanco/zosyn)          Social History:  reports that she has never smoked. She has never used smokeless tobacco. She reports that she does not drink alcohol or use illicit drugs.  Family History  Problem Relation Age of Onset  . Heart disease Mother     CHF  . Mental illness Father  suicide  . Cancer Sister     pancreatic cancer  . COPD Brother     General ROS: see HPI, "I can't walk".   Blood pressure 108/53, pulse 117, temperature 97.5 F (36.4 C), temperature source Oral, resp. rate 23, height $RemoveBe'5\' 7"'lrrHpXEiC$  (1.702 m), weight 79.8 kg (175 lb 14.8 oz), SpO2 97.00%. General appearance: alert, cooperative, no distress and pale Eyes: negative findings: conjunctivae and sclerae normal and pupils equal, round, reactive to light and accomodation Throat: normal findings: oropharynx pink & moist without lesions or evidence of thrush Neck: no adenopathy and supple, symmetrical, trachea midline Lungs: clear to auscultation bilaterally Heart: regular rate and rhythm Abdomen: normal findings: bowel sounds normal and soft, non-tender Extremities: edema 2-3+ and toes and fingers purple-ish, cold   Results for orders placed during the hospital encounter of 11/27/13 (from the past 48 hour(s))  PROTIME-INR     Status: Abnormal   Collection Time    11/28/13  1:00 PM      Result Value Ref Range   Prothrombin Time 24.4 (*) 11.6 - 15.2 seconds   INR 2.20 (*) 0.00 - 1.49  TROPONIN I     Status: None   Collection Time    11/28/13  3:25 PM      Result Value Ref Range   Troponin I <0.30  <0.30 ng/mL   Comment:            Due to the release kinetics of cTnI,     a negative result within the first hours     of the onset of symptoms does not rule out     myocardial infarction with certainty.     If myocardial infarction is still suspected,     repeat the test at appropriate intervals.  SODIUM, URINE, RANDOM     Status: None   Collection Time    11/28/13  5:59 PM      Result Value Ref Range    Sodium, Ur 23    CREATININE, URINE, RANDOM     Status: None   Collection Time    11/28/13  5:59 PM      Result Value Ref Range   Creatinine, Urine 53.97    URINALYSIS, ROUTINE W REFLEX MICROSCOPIC     Status: None   Collection Time    11/28/13  5:59 PM      Result Value Ref Range   Color, Urine YELLOW  YELLOW   APPearance CLEAR  CLEAR   Specific Gravity, Urine 1.023  1.005 - 1.030   pH 5.0  5.0 - 8.0   Glucose, UA NEGATIVE  NEGATIVE mg/dL   Hgb urine dipstick NEGATIVE  NEGATIVE   Bilirubin Urine NEGATIVE  NEGATIVE   Ketones, ur NEGATIVE  NEGATIVE mg/dL   Protein, ur NEGATIVE  NEGATIVE mg/dL   Urobilinogen, UA 0.2  0.0 - 1.0 mg/dL   Nitrite NEGATIVE  NEGATIVE   Leukocytes, UA NEGATIVE  NEGATIVE   Comment: MICROSCOPIC NOT DONE ON URINES WITH NEGATIVE PROTEIN, BLOOD, LEUKOCYTES, NITRITE, OR GLUCOSE <1000 mg/dL.  PROTIME-INR     Status: Abnormal   Collection Time    11/29/13  5:00 AM      Result Value Ref Range   Prothrombin Time 25.2 (*) 11.6 - 15.2 seconds   INR 2.29 (*) 0.00 - 1.49  CBC     Status: Abnormal   Collection Time    11/29/13  5:00 AM      Result Value Ref Range   WBC 3.4 (*)  4.0 - 10.5 K/uL   RBC 3.69 (*) 3.87 - 5.11 MIL/uL   Hemoglobin 11.8 (*) 12.0 - 15.0 g/dL   HCT 36.9  36.0 - 46.0 %   MCV 100.0  78.0 - 100.0 fL   MCH 32.0  26.0 - 34.0 pg   MCHC 32.0  30.0 - 36.0 g/dL   RDW 16.4 (*) 11.5 - 15.5 %   Platelets 103 (*) 150 - 400 K/uL   Comment: CONSISTENT WITH PREVIOUS RESULT  PROTIME-INR     Status: Abnormal   Collection Time    11/30/13  6:08 AM      Result Value Ref Range   Prothrombin Time 29.4 (*) 11.6 - 15.2 seconds   INR 2.79 (*) 0.00 - 1.95  BASIC METABOLIC PANEL     Status: Abnormal   Collection Time    11/30/13  6:08 AM      Result Value Ref Range   Sodium 138  137 - 147 mEq/L   Potassium 4.6  3.7 - 5.3 mEq/L   Chloride 105  96 - 112 mEq/L   CO2 22  19 - 32 mEq/L   Glucose, Bld 90  70 - 99 mg/dL   BUN 20  6 - 23 mg/dL   Creatinine, Ser  1.59 (*) 0.50 - 1.10 mg/dL   Calcium 8.5  8.4 - 10.5 mg/dL   GFR calc non Af Amer 31 (*) >90 mL/min   GFR calc Af Amer 36 (*) >90 mL/min   Comment: (NOTE)     The eGFR has been calculated using the CKD EPI equation.     This calculation has not been validated in all clinical situations.     eGFR's persistently <90 mL/min signify possible Chronic Kidney     Disease.   Anion gap 11  5 - 15      Component Value Date/Time   SDES BLOOD LEFT HAND 11/27/2013 2202   SPECREQUEST BOTTLES DRAWN AEROBIC ONLY 5CC 11/27/2013 2202   CULT  Value:        BLOOD CULTURE RECEIVED NO GROWTH TO DATE CULTURE WILL BE HELD FOR 5 DAYS BEFORE ISSUING A FINAL NEGATIVE REPORT Performed at [facility] 11/27/2013 2202   REPTSTATUS PENDING 11/27/2013 2202   Dg Chest Port 1 View  11/28/2013   CLINICAL DATA:  Labored breathing. History of congestive heart failure, hypertension, lupus  EXAM: PORTABLE CHEST - 1 VIEW  COMPARISON:  11/27/2013; 11/16/2013; chest CT -11/27/2013  FINDINGS: Grossly unchanged enlarged cardiac silhouette and mediastinal contours post median sternotomy. Exuberant calcifications within the mitral valve annulus. Interval placement of right upper extremity approach PICC line with tip projected over the superior cavoatrial junction. Chronic findings of pulmonary venous congestion. Minimally improved aeration of lungs with persistent bibasilar opacities and linear heterogeneous opacities in the peripheral aspect the right mid lung. No new focal airspace opacities chronic trace right-sided effusion is suspected. No pneumothorax. Unchanged bones.  IMPRESSION: 1. Similar findings of cardiomegaly and pulmonary venous congestion without acute cardiopulmonary disease. 2. Unchanged trace right-sided pleural effusion. 3. Minimally improved aeration of the lungs with persistent bibasilar opacities, likely atelectasis.   Electronically Signed   By: Sandi Mariscal M.D.   On: 11/28/2013 12:11   Recent Results (from the past  240 hour(s))  CULTURE, BLOOD (ROUTINE X 2)     Status: None   Collection Time    11/27/13  9:04 PM      Result Value Ref Range Status   Specimen Description BLOOD LEFT  ARM   Final   Special Requests     Final   Value: BOTTLES DRAWN AEROBIC AND ANAEROBIC 8CC BLUE,6CC RED   Culture  Setup Time     Final   Value: 11/28/2013 10:18     Performed at [facility]   Culture     Final   Value:        BLOOD CULTURE RECEIVED NO GROWTH TO DATE CULTURE WILL BE HELD FOR 5 DAYS BEFORE ISSUING A FINAL NEGATIVE REPORT     Performed at [facility]   Report Status PENDING   Incomplete  CULTURE, BLOOD (ROUTINE X 2)     Status: None   Collection Time    11/27/13 10:02 PM      Result Value Ref Range Status   Specimen Description BLOOD LEFT HAND   Final   Special Requests BOTTLES DRAWN AEROBIC ONLY 5CC   Final   Culture  Setup Time     Final   Value: 11/28/2013 10:17     Performed at [facility]   Culture     Final   Value:        BLOOD CULTURE RECEIVED NO GROWTH TO DATE CULTURE WILL BE HELD FOR 5 DAYS BEFORE ISSUING A FINAL NEGATIVE REPORT     Performed at [facility]   Report Status PENDING   Incomplete  MRSA PCR SCREENING     Status: None   Collection Time    11/28/13  2:46 AM      Result Value Ref Range Status   MRSA by PCR NEGATIVE  NEGATIVE Final   Comment:            The GeneXpert MRSA Assay (FDA     approved for NASAL specimens     only), is one component of a     comprehensive MRSA colonization     surveillance program. It is not     intended to diagnose MRSA     infection nor to guide or     monitor treatment for     MRSA infections.      11/30/2013, 11:32 AM     LOS: 3 days

## 2013-11-30 NOTE — Progress Notes (Signed)
[facility] PROGRESS NOTE  DOREENA MAULDEN PPI:[id number] DOB: 03-08-1941 DOA: 11/27/2013 PCP: Hollace Kinnier, DO  Assessment/Plan:  Principal Problem:   Atrial fibrillation with RVR - resolved and currently rate controlled on amiodarone, metoprolol - Continue coumadin, pharmacy to dose.  Active Problems:    Prosthetic valve endocarditis - Most likely source of infectious etiology - Consulted ID - Continue broad spectrum antibiotics    CKD (chronic kidney disease) stage 3, GFR 30-59 ml/min - Will continue to monitor S creatinine levels.    CHF (congestive heart failure) - Continue home regimen. Currently compensated.    Nausea & vomiting - resolved, continue zofran prn  Enteritis due to C diff - Continue oral vancomycin and repeat c diff if she moves her bowels.  Code Status: full Family Communication: none at bedside. Disposition Plan: Pending improvement in condition.   Consultants:  None  Procedures:  none  Antibiotics:  Zosyn and Vancomycin  HPI/Subjective: No new complaints. Family would like to try another appetite stumulant.  Objective: Filed Vitals:   11/30/13 1253  BP:   Pulse: 100  Temp:   Resp:     Intake/Output Summary (Last 24 hours) at 11/30/13 1525 Last data filed at 11/30/13 0358  Gross per 24 hour  Intake    370 ml  Output    350 ml  Net     20 ml   Filed Weights   11/28/13 0300 11/29/13 0457 11/30/13 0357  Weight: 76 kg (167 lb 8.8 oz) 76.2 kg (167 lb 15.9 oz) 79.8 kg (175 lb 14.8 oz)    Exam:   General:  Pt in nad, alert and awake  Cardiovascular: irregularly irregular, no rubs  Respiratory: CTA BL, no wheezes  Abdomen: soft, NT, ND  Musculoskeletal: no cyanosis or clubbing   Data Reviewed: Basic Metabolic Panel:  Recent Labs Lab 11/27/13 2108 11/28/13 0550 11/30/13 0608  NA 137 135* 138  K 5.5* 5.2 4.6  CL 101 102 105  CO2 20 16* 22  GLUCOSE 100* 89 90  BUN 19 21 20   CREATININE 1.48* 1.54* 1.59*   CALCIUM 9.1 8.8 8.5   Liver Function Tests:  Recent Labs Lab 11/27/13 2108 11/28/13 0550  AST 30 26  ALT 17 15  ALKPHOS 225* 184*  BILITOT 1.4* 1.5*  PROT 7.0 6.1  ALBUMIN 2.8* 2.5*   No results found for this basename: LIPASE, AMYLASE,  in the last 168 hours No results found for this basename: AMMONIA,  in the last 168 hours CBC:  Recent Labs Lab 11/27/13 2108 11/28/13 0550 11/29/13 0500  WBC 7.0 4.9 3.4*  NEUTROABS 4.5 3.1  --   HGB 13.0 11.5* 11.8*  HCT 40.4 34.3* 36.9  MCV 100.5* 96.6 100.0  PLT 117* 99* 103*   Cardiac Enzymes:  Recent Labs Lab 11/27/13 2111 11/28/13 0550 11/28/13 0900 11/28/13 1525  TROPONINI <0.30 <0.30 <0.30 <0.30   BNP (last 3 results)  Recent Labs  11/07/13 1000 11/14/13 0923 11/27/13 2111  PROBNP 24587.0* 13807.0* 7774.0*   CBG:  Recent Labs Lab 11/28/13 0255  GLUCAP 90    Recent Results (from the past 240 hour(s))  CULTURE, BLOOD (ROUTINE X 2)     Status: None   Collection Time    11/27/13  9:04 PM      Result Value Ref Range Status   Specimen Description BLOOD LEFT ARM   Final   Special Requests     Final   Value: BOTTLES DRAWN AEROBIC AND ANAEROBIC 8CC BLUE,6CC  RED   Culture  Setup Time     Final   Value: 11/28/2013 10:18     Performed at [facility]   Culture     Final   Value:        BLOOD CULTURE RECEIVED NO GROWTH TO DATE CULTURE WILL BE HELD FOR 5 DAYS BEFORE ISSUING A FINAL NEGATIVE REPORT     Performed at [facility]   Report Status PENDING   Incomplete  CULTURE, BLOOD (ROUTINE X 2)     Status: None   Collection Time    11/27/13 10:02 PM      Result Value Ref Range Status   Specimen Description BLOOD LEFT HAND   Final   Special Requests BOTTLES DRAWN AEROBIC ONLY 5CC   Final   Culture  Setup Time     Final   Value: 11/28/2013 10:17     Performed at [facility]   Culture     Final   Value:        BLOOD CULTURE RECEIVED NO GROWTH TO DATE CULTURE WILL BE HELD FOR 5 DAYS  BEFORE ISSUING A FINAL NEGATIVE REPORT     Performed at [facility]   Report Status PENDING   Incomplete  MRSA PCR SCREENING     Status: None   Collection Time    11/28/13  2:46 AM      Result Value Ref Range Status   MRSA by PCR NEGATIVE  NEGATIVE Final   Comment:            The GeneXpert MRSA Assay (FDA     approved for NASAL specimens     only), is one component of a     comprehensive MRSA colonization     surveillance program. It is not     intended to diagnose MRSA     infection nor to guide or     monitor treatment for     MRSA infections.     Studies: No results found.  Scheduled Meds: . amiodarone  200 mg Oral Daily  . atorvastatin  20 mg Oral q1800  . famotidine  20 mg Oral BID  . feeding supplement (ENSURE COMPLETE)  237 mL Oral TID BM  . furosemide  40 mg Oral Daily  . gabapentin  300 mg Oral QHS  . hydroxychloroquine  200 mg Oral BID  . metoprolol tartrate  25 mg Oral Daily  . mirtazapine  7.5 mg Oral QHS  . piperacillin-tazobactam (ZOSYN)  IV  3.375 g Intravenous 3 times per day  . potassium chloride  30 mEq Oral BID  . saccharomyces boulardii  250 mg Oral BID  . sodium chloride  10-40 mL Intracatheter Q12H  . sodium chloride  3 mL Intravenous Q12H  . vancomycin  250 mg Oral 4 times per day  . warfarin  4 mg Oral q1800  . Warfarin - Pharmacist Dosing Inpatient   Does not apply q1800   Continuous Infusions:    Time spent: > 35 minutes    Velvet Bathe  [facility] Pager [phone] If 7PM-7AM, please contact night-coverage at [email or web address], password [facility] 11/30/2013, 3:25 PM  LOS: 3 days

## 2013-12-01 LAB — BASIC METABOLIC PANEL
ANION GAP: 12 (ref 5–15)
BUN: 18 mg/dL (ref 6–23)
CALCIUM: 8.5 mg/dL (ref 8.4–10.5)
CO2: 23 meq/L (ref 19–32)
Chloride: 103 mEq/L (ref 96–112)
Creatinine, Ser: 1.52 mg/dL — ABNORMAL HIGH (ref 0.50–1.10)
GFR calc Af Amer: 38 mL/min — ABNORMAL LOW (ref >90)
GFR, EST NON AFRICAN AMERICAN: 32 mL/min — AB (ref >90)
Glucose, Bld: 80 mg/dL (ref 70–99)
Potassium: 4.7 mEq/L (ref 3.7–5.3)
SODIUM: 138 meq/L (ref 137–147)

## 2013-12-01 LAB — CBC
HCT: 36 % (ref 36.0–46.0)
Hemoglobin: 11.3 g/dL — ABNORMAL LOW (ref 12.0–15.0)
MCH: 30.9 pg (ref 26.0–34.0)
MCHC: 31.4 g/dL (ref 30.0–36.0)
MCV: 98.4 fL (ref 78.0–100.0)
PLATELETS: 101 10*3/uL — AB (ref 150–400)
RBC: 3.66 MIL/uL — AB (ref 3.87–5.11)
RDW: 16.3 % — AB (ref 11.5–15.5)
WBC: 2.9 10*3/uL — ABNORMAL LOW (ref 4.0–10.5)

## 2013-12-01 LAB — GI PATHOGEN PANEL BY PCR, STOOL
C difficile toxin A/B: NEGATIVE
CRYPTOSPORIDIUM BY PCR: NEGATIVE
Campylobacter by PCR: NEGATIVE
E coli (ETEC) LT/ST: NEGATIVE
E coli (STEC): NEGATIVE
E coli 0157 by PCR: NEGATIVE
G LAMBLIA BY PCR: NEGATIVE
Norovirus GI/GII: NEGATIVE
Rotavirus A by PCR: NEGATIVE
Salmonella by PCR: NEGATIVE
Shigella by PCR: NEGATIVE

## 2013-12-01 LAB — PROTIME-INR
INR: 3.37 — ABNORMAL HIGH (ref 0.00–1.49)
Prothrombin Time: 34.1 seconds — ABNORMAL HIGH (ref 11.6–15.2)

## 2013-12-01 MED ORDER — VANCOMYCIN 50 MG/ML ORAL SOLUTION
125.0000 mg | Freq: Two times a day (BID) | ORAL | Status: DC
  Administered 2013-12-01 – 2013-12-08 (×14): 125 mg via ORAL
  Filled 2013-12-01 (×16): qty 2.5

## 2013-12-01 NOTE — Progress Notes (Signed)
UR Completed Laiza Veenstra Graves-Bigelow, RN,BSN [phone]  

## 2013-12-01 NOTE — Progress Notes (Signed)
Pharmacy CONSULT NOTE   Pharmacy Consult for Coumadin Indication: atrial fibrillation  Allergies  Allergen Reactions  . Ceftriaxone     RASH BUT TOLERATED AMPICILLIN AND IMIPENEM WITHOUT PROBLEMS  . Codeine Nausea Only    Patient Measurements: Height: 5\' 7"  (170.2 cm) Weight: 175 lb 14.8 oz (79.8 kg) IBW/kg (Calculated) : 61.6  Vital Signs: Temp: 97.9 F (36.6 C) (09/08 0754) Temp src: Axillary (09/08 0754) BP: 94/60 mmHg (09/08 0754) Pulse Rate: 102 (09/08 0754)  Labs:  Recent Labs  11/28/13 0900  11/28/13 1525 11/29/13 0500 11/30/13 0608 12/01/13 0330  HGB  --   --   --  11.8*  --  11.3*  HCT  --   --   --  36.9  --  36.0  PLT  --   --   --  103*  --  101*  LABPROT  --   < >  --  25.2* 29.4* 34.1*  INR  --   < >  --  2.29* 2.79* 3.37*  CREATININE  --   --   --   --  1.59* 1.52*  TROPONINI <0.30  --  <0.30  --   --   --   < > = values in this interval not displayed.  Estimated Creatinine Clearance: 34.8 ml/min (by C-G formula based on Cr of 1.52).   Assessment: 73 yo female with Afib on chronic warfarin. INR this morning is above goal at 3.37. No bleeding issues noted, cbc low but stable. Amiodarone 200mg  daily which is a home med, no other major drug interactions seen. She was continued on home dose of 4mg  daily.  Goal of Therapy:  INR goal 2-3 Monitor platelets by anticoagulation protocol: Yes   Plan:  1. Hold warfarin tonight 2. Daily PT/INR 3. Follow for s/s bleeding  Marigrace Mccole D. Roylene Heaton, PharmD, BCPS Clinical Pharmacist Pager: [phone] 12/01/2013 8:46 AM

## 2013-12-01 NOTE — Progress Notes (Addendum)
INFECTIOUS DISEASE PROGRESS NOTE  ID: [name] is a 73 y.o. female with  Principal Problem:   Atrial fibrillation with RVR Active Problems:   MIXED CONNECTIVE TISSUE DISEASE   Prosthetic valve endocarditis   CKD (chronic kidney disease) stage 3, GFR 30-59 ml/min   Enteritis due to Clostridium difficile   CHF (congestive heart failure)   Nausea & vomiting  Subjective: Without complaints.  No nv/, no diarrhea.   Abtx:  Anti-infectives   Start     Dose/Rate Route Frequency Ordered Stop   11/29/13 0600  vancomycin (VANCOCIN) IVPB 750 mg/150 ml premix  Status:  Discontinued     750 mg 150 mL/hr over 60 Minutes Intravenous Every 24 hours 11/28/13 0315 11/30/13 1207   11/28/13 0600  vancomycin (VANCOCIN) 50 mg/mL oral solution 250 mg     250 mg Oral 4 times per day 11/28/13 0259     11/28/13 0400  piperacillin-tazobactam (ZOSYN) IVPB 3.375 g     3.375 g 12.5 mL/hr over 240 Minutes Intravenous 3 times per day 11/28/13 0316     11/28/13 0345  amoxicillin (AMOXIL) capsule 500 mg  Status:  Discontinued     500 mg Oral Every 12 hours 11/28/13 0259 11/28/13 0316   11/28/13 0345  hydroxychloroquine (PLAQUENIL) tablet 200 mg     200 mg Oral 2 times daily 11/28/13 0259     11/28/13 0330  vancomycin (VANCOCIN) 1,250 mg in sodium chloride 0.9 % 250 mL IVPB     1,250 mg 166.7 mL/hr over 90 Minutes Intravenous  Once 11/28/13 0315 11/28/13 0646   11/28/13 0000  levofloxacin (LEVAQUIN) IVPB 750 mg  Status:  Discontinued     750 mg 100 mL/hr over 90 Minutes Intravenous  Once 11/27/13 2356 11/28/13 0015      Medications:  Scheduled: . amiodarone  200 mg Oral Daily  . atorvastatin  20 mg Oral q1800  . dronabinol  2.5 mg Oral BID AC  . famotidine  20 mg Oral BID  . feeding supplement (ENSURE COMPLETE)  237 mL Oral TID BM  . furosemide  40 mg Oral Daily  . gabapentin  300 mg Oral QHS  . hydroxychloroquine  200 mg Oral BID  . metoprolol tartrate  25 mg Oral Daily  . mirtazapine   7.5 mg Oral QHS  . piperacillin-tazobactam (ZOSYN)  IV  3.375 g Intravenous 3 times per day  . potassium chloride  30 mEq Oral BID  . saccharomyces boulardii  250 mg Oral BID  . sodium chloride  10-40 mL Intracatheter Q12H  . sodium chloride  3 mL Intravenous Q12H  . vancomycin  250 mg Oral 4 times per day  . Warfarin - Pharmacist Dosing Inpatient   Does not apply q1800    Objective: Vital signs in last 24 hours: Temp:  [97.4 F (36.3 C)-98.4 F (36.9 C)] 97.9 F (36.6 C) (09/08 0754) Pulse Rate:  [84-117] 102 (09/08 0754) Resp:  [12-27] 14 (09/08 0754) BP: (94-127)/(57-81) 94/60 mmHg (09/08 0754) SpO2:  [96 %-99 %] 99 % (09/08 0754)   General appearance: alert, cooperative and no distress Resp: clear to auscultation bilaterally Cardio: irregularly irregular rhythm and systolic murmur: systolic ejection 3/6, crescendo at 2nd left intercostal space GI: normal findings: bowel sounds normal and soft, non-tender  Lab Results  Recent Labs  11/29/13 0500 11/30/13 0608 12/01/13 0330  WBC 3.4*  --  2.9*  HGB 11.8*  --  11.3*  HCT 36.9  --  36.0  NA  --  138 138  K  --  4.6 4.7  CL  --  105 103  CO2  --  22 23  BUN  --  20 18  CREATININE  --  1.59* 1.52*   Liver Panel No results found for this basename: PROT, ALBUMIN, AST, ALT, ALKPHOS, BILITOT, BILIDIR, IBILI,  in the last 72 hours Sedimentation Rate No results found for this basename: ESRSEDRATE,  in the last 72 hours C-Reactive Protein No results found for this basename: CRP,  in the last 72 hours  Microbiology: Recent Results (from the past 240 hour(s))  CULTURE, BLOOD (ROUTINE X 2)     Status: None   Collection Time    11/27/13  9:04 PM      Result Value Ref Range Status   Specimen Description BLOOD LEFT ARM   Final   Special Requests     Final   Value: BOTTLES DRAWN AEROBIC AND ANAEROBIC 8CC BLUE,6CC RED   Culture  Setup Time     Final   Value: 11/28/2013 10:18     Performed at [facility]    Culture     Final   Value:        BLOOD CULTURE RECEIVED NO GROWTH TO DATE CULTURE WILL BE HELD FOR 5 DAYS BEFORE ISSUING A FINAL NEGATIVE REPORT     Note: Culture results may be compromised due to an excessive volume of blood received in culture bottles.     Performed at [facility]   Report Status PENDING   Incomplete  CULTURE, BLOOD (ROUTINE X 2)     Status: None   Collection Time    11/27/13 10:02 PM      Result Value Ref Range Status   Specimen Description BLOOD LEFT HAND   Final   Special Requests BOTTLES DRAWN AEROBIC ONLY 5CC   Final   Culture  Setup Time     Final   Value: 11/28/2013 10:17     Performed at [facility]   Culture     Final   Value:        BLOOD CULTURE RECEIVED NO GROWTH TO DATE CULTURE WILL BE HELD FOR 5 DAYS BEFORE ISSUING A FINAL NEGATIVE REPORT     Performed at [facility]   Report Status PENDING   Incomplete  MRSA PCR SCREENING     Status: None   Collection Time    11/28/13  2:46 AM      Result Value Ref Range Status   MRSA by PCR NEGATIVE  NEGATIVE Final   Comment:            The GeneXpert MRSA Assay (FDA     approved for NASAL specimens     only), is one component of a     comprehensive MRSA colonization     surveillance program. It is not     intended to diagnose MRSA     infection nor to guide or     monitor treatment for     MRSA infections.  CLOSTRIDIUM DIFFICILE BY PCR     Status: None   Collection Time    11/30/13  3:07 PM      Result Value Ref Range Status   C difficile by pcr NEGATIVE  NEGATIVE Final    Studies/Results: No results found.   Assessment/Plan: Gastritis  Prev enterococcal bacteremia  Prev CVA  Total days of antibiotics: 4 vanco/zosyn  stop her vanco IV.  Change  PO vanco to bid in anticipation of taper dose Out of unit? Not clear of the cause of her syndrome for this admission. Gastritis? She does not have + Cx.          Bobby Rumpf Infectious Diseases (pager)  [phone] [email or web address] 12/01/2013, 10:12 AM  LOS: 4 days

## 2013-12-01 NOTE — Progress Notes (Signed)
Pt transferred to 3E18 per MD order. Report called to receiving nurse and all questions answered.

## 2013-12-02 ENCOUNTER — Inpatient Hospital Stay (HOSPITAL_COMMUNITY): Payer: Medicare Other

## 2013-12-02 DIAGNOSIS — N183 Chronic kidney disease, stage 3 unspecified: Secondary | ICD-10-CM

## 2013-12-02 DIAGNOSIS — R112 Nausea with vomiting, unspecified: Secondary | ICD-10-CM

## 2013-12-02 DIAGNOSIS — A0472 Enterocolitis due to Clostridium difficile, not specified as recurrent: Secondary | ICD-10-CM

## 2013-12-02 DIAGNOSIS — I4891 Unspecified atrial fibrillation: Principal | ICD-10-CM

## 2013-12-02 LAB — CBC
HEMATOCRIT: 34.7 % — AB (ref 36.0–46.0)
Hemoglobin: 11.1 g/dL — ABNORMAL LOW (ref 12.0–15.0)
MCH: 31.9 pg (ref 26.0–34.0)
MCHC: 32 g/dL (ref 30.0–36.0)
MCV: 99.7 fL (ref 78.0–100.0)
Platelets: 123 10*3/uL — ABNORMAL LOW (ref 150–400)
RBC: 3.48 MIL/uL — ABNORMAL LOW (ref 3.87–5.11)
RDW: 16.4 % — AB (ref 11.5–15.5)
WBC: 4.2 10*3/uL (ref 4.0–10.5)

## 2013-12-02 LAB — PROTIME-INR
INR: 3.52 — ABNORMAL HIGH (ref 0.00–1.49)
Prothrombin Time: 35.3 seconds — ABNORMAL HIGH (ref 11.6–15.2)

## 2013-12-02 MED ORDER — METOPROLOL TARTRATE 12.5 MG HALF TABLET
12.5000 mg | ORAL_TABLET | Freq: Two times a day (BID) | ORAL | Status: DC
  Administered 2013-12-02 – 2013-12-03 (×4): 12.5 mg via ORAL
  Filled 2013-12-02 (×8): qty 1

## 2013-12-02 NOTE — Progress Notes (Signed)
INFECTIOUS DISEASE PROGRESS NOTE  ID: [name] is a 73 y.o. female with  Principal Problem:   Atrial fibrillation with RVR Active Problems:   MIXED CONNECTIVE TISSUE DISEASE   Prosthetic valve endocarditis   CKD (chronic kidney disease) stage 3, GFR 30-59 ml/min   Enteritis due to Clostridium difficile   CHF (congestive heart failure)   Nausea & vomiting  Subjective: Without complaints No SOB.  No loose BM  Abtx:  Anti-infectives   Start     Dose/Rate Route Frequency Ordered Stop   12/01/13 1200  vancomycin (VANCOCIN) 50 mg/mL oral solution 125 mg     125 mg Oral Every 12 hours 12/01/13 1024     11/29/13 0600  vancomycin (VANCOCIN) IVPB 750 mg/150 ml premix  Status:  Discontinued     750 mg 150 mL/hr over 60 Minutes Intravenous Every 24 hours 11/28/13 0315 11/30/13 1207   11/28/13 0600  vancomycin (VANCOCIN) 50 mg/mL oral solution 250 mg  Status:  Discontinued     250 mg Oral 4 times per day 11/28/13 0259 12/01/13 1023   11/28/13 0400  piperacillin-tazobactam (ZOSYN) IVPB 3.375 g     3.375 g 12.5 mL/hr over 240 Minutes Intravenous 3 times per day 11/28/13 0316     11/28/13 0345  amoxicillin (AMOXIL) capsule 500 mg  Status:  Discontinued     500 mg Oral Every 12 hours 11/28/13 0259 11/28/13 0316   11/28/13 0345  hydroxychloroquine (PLAQUENIL) tablet 200 mg     200 mg Oral 2 times daily 11/28/13 0259     11/28/13 0330  vancomycin (VANCOCIN) 1,250 mg in sodium chloride 0.9 % 250 mL IVPB     1,250 mg 166.7 mL/hr over 90 Minutes Intravenous  Once 11/28/13 0315 11/28/13 0646   11/28/13 0000  levofloxacin (LEVAQUIN) IVPB 750 mg  Status:  Discontinued     750 mg 100 mL/hr over 90 Minutes Intravenous  Once 11/27/13 2356 11/28/13 0015      Medications:  Scheduled: . amiodarone  200 mg Oral Daily  . atorvastatin  20 mg Oral q1800  . dronabinol  2.5 mg Oral BID AC  . famotidine  20 mg Oral BID  . feeding supplement (ENSURE COMPLETE)  237 mL Oral TID BM  . furosemide   40 mg Oral Daily  . gabapentin  300 mg Oral QHS  . hydroxychloroquine  200 mg Oral BID  . metoprolol tartrate  12.5 mg Oral BID  . mirtazapine  7.5 mg Oral QHS  . piperacillin-tazobactam (ZOSYN)  IV  3.375 g Intravenous 3 times per day  . potassium chloride  30 mEq Oral BID  . saccharomyces boulardii  250 mg Oral BID  . sodium chloride  10-40 mL Intracatheter Q12H  . sodium chloride  3 mL Intravenous Q12H  . vancomycin  125 mg Oral Q12H  . Warfarin - Pharmacist Dosing Inpatient   Does not apply q1800    Objective: Vital signs in last 24 hours: Temp:  [98 F (36.7 C)-99.2 F (37.3 C)] 98.6 F (37 C) (09/09 1300) Pulse Rate:  [98-118] 102 (09/09 1300) Resp:  [16-22] 22 (09/09 1300) BP: (81-122)/(26-72) 96/62 mmHg (09/09 1300) SpO2:  [93 %-100 %] 97 % (09/09 1300) Weight:  [77.61 kg (171 lb 1.6 oz)] 77.61 kg (171 lb 1.6 oz) (09/09 0552)   General appearance: alert, cooperative and no distress Resp: clear to auscultation bilaterally Cardio: regular rate and rhythm GI: normal findings: bowel sounds normal and soft, non-tender  Lab  Results  Recent Labs  11/30/13 0608 12/01/13 0330 12/02/13 0645  WBC  --  2.9* 4.2  HGB  --  11.3* 11.1*  HCT  --  36.0 34.7*  NA 138 138  --   K 4.6 4.7  --   CL 105 103  --   CO2 22 23  --   BUN 20 18  --   CREATININE 1.59* 1.52*  --    Liver Panel No results found for this basename: PROT, ALBUMIN, AST, ALT, ALKPHOS, BILITOT, BILIDIR, IBILI,  in the last 72 hours Sedimentation Rate No results found for this basename: ESRSEDRATE,  in the last 72 hours C-Reactive Protein No results found for this basename: CRP,  in the last 72 hours  Microbiology: Recent Results (from the past 240 hour(s))  CULTURE, BLOOD (ROUTINE X 2)     Status: None   Collection Time    11/27/13  9:04 PM      Result Value Ref Range Status   Specimen Description BLOOD LEFT ARM   Final   Special Requests     Final   Value: BOTTLES DRAWN AEROBIC AND ANAEROBIC 8CC  BLUE,6CC RED   Culture  Setup Time     Final   Value: 11/28/2013 10:18     Performed at [facility]   Culture     Final   Value:        BLOOD CULTURE RECEIVED NO GROWTH TO DATE CULTURE WILL BE HELD FOR 5 DAYS BEFORE ISSUING A FINAL NEGATIVE REPORT     Note: Culture results may be compromised due to an excessive volume of blood received in culture bottles.     Performed at [facility]   Report Status PENDING   Incomplete  CULTURE, BLOOD (ROUTINE X 2)     Status: None   Collection Time    11/27/13 10:02 PM      Result Value Ref Range Status   Specimen Description BLOOD LEFT HAND   Final   Special Requests BOTTLES DRAWN AEROBIC ONLY 5CC   Final   Culture  Setup Time     Final   Value: 11/28/2013 10:17     Performed at [facility]   Culture     Final   Value:        BLOOD CULTURE RECEIVED NO GROWTH TO DATE CULTURE WILL BE HELD FOR 5 DAYS BEFORE ISSUING A FINAL NEGATIVE REPORT     Performed at [facility]   Report Status PENDING   Incomplete  MRSA PCR SCREENING     Status: None   Collection Time    11/28/13  2:46 AM      Result Value Ref Range Status   MRSA by PCR NEGATIVE  NEGATIVE Final   Comment:            The GeneXpert MRSA Assay (FDA     approved for NASAL specimens     only), is one component of a     comprehensive MRSA colonization     surveillance program. It is not     intended to diagnose MRSA     infection nor to guide or     monitor treatment for     MRSA infections.  CLOSTRIDIUM DIFFICILE BY PCR     Status: None   Collection Time    11/30/13  3:07 PM      Result Value Ref Range Status   C difficile by pcr NEGATIVE  NEGATIVE  Final    Studies/Results: No results found.   Assessment/Plan: Gastritis  Prev enterococcal bacteremia  Prev CVA  Total days of antibiotics 5 zosyn, po vanco bid  Pt should be in C diff isolation for 30 days after last + Plan for 6 weeks of po vanco (taper- bid for 7 days, qday for 7 days, qod for  14 days) Would resume amoxil at d/c.  Available if questions.          Bobby Rumpf Infectious Diseases (pager) [phone] [email or web address] 12/02/2013, 2:53 PM  LOS: 5 days

## 2013-12-02 NOTE — Progress Notes (Signed)
While rounding on the patient , find pt. Fingers look purple and cool to touch, warm patient hand up to get oxygen saturation. 99-100%/3L, pt. Alert and oriented, pt. Denies pain or dizziness, v/s stable. MD called and came to see pt.no new order given, MD instruct to continue monitor patient. Again around 1800 family at bedside said patient look different  From yesterday, assess the patient same from earlier alert oriented x4 . MD called to the room assess patient, talk to the family. CT of the head order. Will continue to monitor patient

## 2013-12-02 NOTE — Progress Notes (Signed)
[facility] PROGRESS NOTE  CHIZUKO TRINE IZT:[id number] DOB: 1940/12/03 DOA: 11/27/2013 PCP: Hollace Kinnier, DO  Assessment/Plan: 1. Atrial fibrillation rapid ventricular response -Heart rates remained in the 90s to 100's -She is on metoprolol 12.5 mg by mouth twice a day and amiodarone 200 mg by mouth daily -Anticoagulated with warfarin, pharmacy consultation for warfarin management. -Last transthoracic echocardiogram performed on 08/16/2013 which showed an ejection fraction of 60-65% without wall motion abnormalities.  Patient having a history of chronic A. Fib  2.  History of prostatic valve endocarditis. -Dr. Johnnye Sima of infectious disease consulted, she is currently on IV Zosyn -Plan to discharge her on amoxicillin  3.  C Diff Colitis -Patient seen and evaluated by infectious disease, plan on 6 weeks of by mouth vancomycin taper  4. Chronic diastolic congestive heart failure -Last transthoracic echocardiogram performed on 11/16/2013 showing ejection fraction of 60-65%  Code Status: Full code Family Communication:  Disposition Plan:    Consultants:  Infectious disease  Antibiotics:  Vancomycin by mouth  IV Zosyn  HPI/Subjective: Patient is a pleasant 73-year-old female with a history of recurrent endocarditis, chronic atrial fibrillation, blood pressure like aortic valve was admitted to the medicine service on 11/28/2013, patient presenting with nausea and vomiting. She says be in A. fib with RVR, which likely precipitated acute decompensated congestive heart failure. Infectious disease was consulted given history of endocarditis.   Objective: Filed Vitals:   12/02/13 1300  BP: 96/62  Pulse: 102  Temp: 98.6 F (37 C)  Resp: 22    Intake/Output Summary (Last 24 hours) at 12/02/13 1515 Last data filed at 12/02/13 1352  Gross per 24 hour  Intake    260 ml  Output    200 ml  Net     60 ml   Filed Weights   11/30/13 0357 12/01/13 1227 12/02/13 0552   Weight: 79.8 kg (175 lb 14.8 oz) 78.8 kg (173 lb 11.6 oz) 77.61 kg (171 lb 1.6 oz)    Exam:   General:  Patient is in no acute distress, awake alert  Cardiovascular: Regular rate and rhythm normal S1-S2  Respiratory: Clear to auscultation bilaterally  Abdomen: Soft nontender nondistended  Musculoskeletal: No edema  Data Reviewed: Basic Metabolic Panel:  Recent Labs Lab 11/27/13 2108 11/28/13 0550 11/30/13 0608 12/01/13 0330  NA 137 135* 138 138  K 5.5* 5.2 4.6 4.7  CL 101 102 105 103  CO2 20 16* 22 23  GLUCOSE 100* 89 90 80  BUN 19 21 20 18   CREATININE 1.48* 1.54* 1.59* 1.52*  CALCIUM 9.1 8.8 8.5 8.5   Liver Function Tests:  Recent Labs Lab 11/27/13 2108 11/28/13 0550  AST 30 26  ALT 17 15  ALKPHOS 225* 184*  BILITOT 1.4* 1.5*  PROT 7.0 6.1  ALBUMIN 2.8* 2.5*   No results found for this basename: LIPASE, AMYLASE,  in the last 168 hours No results found for this basename: AMMONIA,  in the last 168 hours CBC:  Recent Labs Lab 11/27/13 2108 11/28/13 0550 11/29/13 0500 12/01/13 0330 12/02/13 0645  WBC 7.0 4.9 3.4* 2.9* 4.2  NEUTROABS 4.5 3.1  --   --   --   HGB 13.0 11.5* 11.8* 11.3* 11.1*  HCT 40.4 34.3* 36.9 36.0 34.7*  MCV 100.5* 96.6 100.0 98.4 99.7  PLT 117* 99* 103* 101* 123*   Cardiac Enzymes:  Recent Labs Lab 11/27/13 2111 11/28/13 0550 11/28/13 0900 11/28/13 1525  TROPONINI <0.30 <0.30 <0.30 <0.30   BNP (last 3 results)  Recent Labs  11/07/13 1000 11/14/13 0923 11/27/13 2111  PROBNP 24587.0* 13807.0* 7774.0*   CBG:  Recent Labs Lab 11/28/13 0255  GLUCAP 90    Recent Results (from the past 240 hour(s))  CULTURE, BLOOD (ROUTINE X 2)     Status: None   Collection Time    11/27/13  9:04 PM      Result Value Ref Range Status   Specimen Description BLOOD LEFT ARM   Final   Special Requests     Final   Value: BOTTLES DRAWN AEROBIC AND ANAEROBIC 8CC BLUE,6CC RED   Culture  Setup Time     Final   Value: 11/28/2013  10:18     Performed at [facility]   Culture     Final   Value:        BLOOD CULTURE RECEIVED NO GROWTH TO DATE CULTURE WILL BE HELD FOR 5 DAYS BEFORE ISSUING A FINAL NEGATIVE REPORT     Note: Culture results may be compromised due to an excessive volume of blood received in culture bottles.     Performed at [facility]   Report Status PENDING   Incomplete  CULTURE, BLOOD (ROUTINE X 2)     Status: None   Collection Time    11/27/13 10:02 PM      Result Value Ref Range Status   Specimen Description BLOOD LEFT HAND   Final   Special Requests BOTTLES DRAWN AEROBIC ONLY 5CC   Final   Culture  Setup Time     Final   Value: 11/28/2013 10:17     Performed at [facility]   Culture     Final   Value:        BLOOD CULTURE RECEIVED NO GROWTH TO DATE CULTURE WILL BE HELD FOR 5 DAYS BEFORE ISSUING A FINAL NEGATIVE REPORT     Performed at [facility]   Report Status PENDING   Incomplete  MRSA PCR SCREENING     Status: None   Collection Time    11/28/13  2:46 AM      Result Value Ref Range Status   MRSA by PCR NEGATIVE  NEGATIVE Final   Comment:            The GeneXpert MRSA Assay (FDA     approved for NASAL specimens     only), is one component of a     comprehensive MRSA colonization     surveillance program. It is not     intended to diagnose MRSA     infection nor to guide or     monitor treatment for     MRSA infections.  CLOSTRIDIUM DIFFICILE BY PCR     Status: None   Collection Time    11/30/13  3:07 PM      Result Value Ref Range Status   C difficile by pcr NEGATIVE  NEGATIVE Final     Studies: No results found.  Scheduled Meds: . amiodarone  200 mg Oral Daily  . atorvastatin  20 mg Oral q1800  . dronabinol  2.5 mg Oral BID AC  . famotidine  20 mg Oral BID  . feeding supplement (ENSURE COMPLETE)  237 mL Oral TID BM  . furosemide  40 mg Oral Daily  . gabapentin  300 mg Oral QHS  . hydroxychloroquine  200 mg Oral BID  . metoprolol  tartrate  12.5 mg Oral BID  . mirtazapine  7.5 mg Oral QHS  . piperacillin-tazobactam (  ZOSYN)  IV  3.375 g Intravenous 3 times per day  . potassium chloride  30 mEq Oral BID  . saccharomyces boulardii  250 mg Oral BID  . sodium chloride  10-40 mL Intracatheter Q12H  . sodium chloride  3 mL Intravenous Q12H  . vancomycin  125 mg Oral Q12H  . Warfarin - Pharmacist Dosing Inpatient   Does not apply q1800   Continuous Infusions:   Principal Problem:   Atrial fibrillation with RVR Active Problems:   Prosthetic valve endocarditis   MIXED CONNECTIVE TISSUE DISEASE   CKD (chronic kidney disease) stage 3, GFR 30-59 ml/min   Enteritis due to Clostridium difficile   CHF (congestive heart failure)   Nausea & vomiting    Time spent: 25 minutes    Kelvin Cellar  [facility] Pager [phone]. If 7PM-7AM, please contact night-coverage at [email or web address], password [facility] 12/02/2013, 3:15 PM  LOS: 5 days

## 2013-12-02 NOTE — Progress Notes (Signed)
Physical Therapy Treatment Patient Details Name: [name] MRN: [id number] DOB: 1940/12/03 Today's Date: 12/02/2013    History of Present Illness ZOELLE MARKUS is a 73 y.o. female with history of recurrent endocarditis, chronic atrial fibrillation, bioprosthetic aortic valve, CHF, recently admitted for CVA, recently treated for C. difficile colitis and is on oral vancomycin, CAD status post CABG, lupus and admitted with afib with RVR and Prosthetic valve endocarditis.    PT Comments    Pt assisted with bed mobility, requiring at least max assist +2 at this time.  Recommend nsg staff use lift if pt OOB.  Follow Up Recommendations  SNF;Supervision/Assistance - 24 hour     Equipment Recommendations  None recommended by PT    Recommendations for Other Services       Precautions / Restrictions Precautions Precautions: Fall Precaution Comments: monitor sats    Mobility  Bed Mobility Overal bed mobility: Needs Assistance;+2 for physical assistance Bed Mobility: Rolling;Sidelying to Sit;Sit to Supine Rolling: Max assist;+2 for physical assistance Sidelying to sit: Total assist;+2 for physical assistance   Sit to supine: Mod assist;+2 for physical assistance   General bed mobility comments: verbal cues for technique, pt with increased difficulty moving lower body and poor trunk strength, pt attempted to assist with upper extremities and rails  Transfers                    Ambulation/Gait                 Stairs            Wheelchair Mobility    Modified Rankin (Stroke Patients Only)       Balance     Sitting balance-Leahy Scale: Zero Sitting balance - Comments: pt with posterior lean or right lateral lean, fatigues very quickly, attempted to have pt hold mildine usually less then 30 sec before pt unable to maintain and required more trunk support                            Cognition Arousal/Alertness: Awake/alert Behavior  During Therapy: Flat affect Overall Cognitive Status: No family/caregiver present to determine baseline cognitive functioning Area of Impairment: Problem solving;Following commands;Safety/judgement   Current Attention Level: Focused Memory: Decreased short-term memory Following Commands: Follows one step commands with increased time Safety/Judgement: Decreased awareness of deficits   Problem Solving: Slow processing;Difficulty sequencing;Requires verbal cues;Requires tactile cues General Comments: required multimodal cues for performing mobility/tasks    Exercises      General Comments        Pertinent Vitals/Pain Pain Assessment: No/denies pain    Home Living                      Prior Function            PT Goals (current goals can now be found in the care plan section) Progress towards PT goals: Progressing toward goals    Frequency  Min 2X/week    PT Plan Current plan remains appropriate    Co-evaluation             End of Session Equipment Utilized During Treatment: Oxygen Activity Tolerance: Patient limited by fatigue Patient left: with call bell/phone within reach;in bed;with nursing/sitter in room     Time: 9242-6834 PT Time Calculation (min): 26 min  Charges:  $Therapeutic Activity: 23-37 mins  G Codes:      Nikcole Eischeid,KATHrine E 2013-12-17, 12:26 PM Carmelia Bake, PT, DPT Dec 17, 2013 Pager: [phone]

## 2013-12-02 NOTE — Progress Notes (Signed)
ANTICOAGULATION CONSULT NOTE - Follow Up Consult  Pharmacy Consult for warfarin Indication: atrial fibrillation  Allergies  Allergen Reactions  . Ceftriaxone     RASH BUT TOLERATED AMPICILLIN AND IMIPENEM WITHOUT PROBLEMS  . Codeine Nausea Only    Patient Measurements: Height: 5\' 7"  (170.2 cm) Weight: 171 lb 1.6 oz (77.61 kg) (bed scale) IBW/kg (Calculated) : 61.6  Vital Signs: Temp: 98 F (36.7 C) (09/09 0552) Temp src: Oral (09/09 0552) BP: 107/65 mmHg (09/09 0552) Pulse Rate: 109 (09/09 0552)  Labs:  Recent Labs  11/30/13 0608 12/01/13 0330 12/02/13 0645  HGB  --  11.3* 11.1*  HCT  --  36.0 34.7*  PLT  --  101* 123*  LABPROT 29.4* 34.1* 35.3*  INR 2.79* 3.37* 3.52*  CREATININE 1.59* 1.52*  --     Estimated Creatinine Clearance: 34.3 ml/min (by C-G formula based on Cr of 1.52).    Assessment: 73 y/o female on chronic warfarin for Afib. INR is supratherapeutic this morning at 3.52. No bleeding noted, CBC stable.  PTA: 4 mg daily  Goal of Therapy:  INR 2-3 Monitor platelets by anticoagulation protocol: Yes   Plan:  - Hold warfarin dose tonight - INR daily  [address] [facility], Pharm.D., BCPS Clinical Pharmacist Pager: [phone] 12/02/2013 10:11 AM

## 2013-12-03 ENCOUNTER — Telehealth: Payer: Self-pay | Admitting: *Deleted

## 2013-12-03 DIAGNOSIS — R627 Adult failure to thrive: Secondary | ICD-10-CM

## 2013-12-03 DIAGNOSIS — Z5189 Encounter for other specified aftercare: Secondary | ICD-10-CM

## 2013-12-03 LAB — BASIC METABOLIC PANEL
Anion gap: 14 (ref 5–15)
BUN: 17 mg/dL (ref 6–23)
CO2: 19 mEq/L (ref 19–32)
CREATININE: 1.58 mg/dL — AB (ref 0.50–1.10)
Calcium: 8.7 mg/dL (ref 8.4–10.5)
Chloride: 103 mEq/L (ref 96–112)
GFR, EST AFRICAN AMERICAN: 36 mL/min — AB (ref >90)
GFR, EST NON AFRICAN AMERICAN: 31 mL/min — AB (ref >90)
GLUCOSE: 71 mg/dL (ref 70–99)
Potassium: 5 mEq/L (ref 3.7–5.3)
Sodium: 136 mEq/L — ABNORMAL LOW (ref 137–147)

## 2013-12-03 LAB — CBC
HEMATOCRIT: 35 % — AB (ref 36.0–46.0)
Hemoglobin: 11 g/dL — ABNORMAL LOW (ref 12.0–15.0)
MCH: 31.3 pg (ref 26.0–34.0)
MCHC: 31.4 g/dL (ref 30.0–36.0)
MCV: 99.4 fL (ref 78.0–100.0)
Platelets: 111 10*3/uL — ABNORMAL LOW (ref 150–400)
RBC: 3.52 MIL/uL — ABNORMAL LOW (ref 3.87–5.11)
RDW: 16.3 % — AB (ref 11.5–15.5)
WBC: 3.4 10*3/uL — ABNORMAL LOW (ref 4.0–10.5)

## 2013-12-03 LAB — PROTIME-INR
INR: 3.19 — ABNORMAL HIGH (ref 0.00–1.49)
Prothrombin Time: 32.7 seconds — ABNORMAL HIGH (ref 11.6–15.2)

## 2013-12-03 MED ORDER — FUROSEMIDE 40 MG PO TABS
60.0000 mg | ORAL_TABLET | Freq: Every day | ORAL | Status: DC
  Filled 2013-12-03 (×2): qty 1

## 2013-12-03 MED ORDER — WARFARIN SODIUM 2 MG PO TABS
2.0000 mg | ORAL_TABLET | Freq: Once | ORAL | Status: AC
  Administered 2013-12-03: 2 mg via ORAL
  Filled 2013-12-03: qty 1

## 2013-12-03 NOTE — Telephone Encounter (Signed)
Patient daughter called to report that her mother is in the hospital again and she is concerned. She states that her mother does fine while on IV antibiotics but usually as soon as she comes off and goes to oral the infection comes back and she ends up in the hospital. She wants to know if Dr Johnnye Sima is going to increase her PO medication or what is he going to do. She is concerned that her mother will not be able to fight through another hospitalization for infection. Advised Ms Quentin Cornwall that I am unable to say what the doctor is planing to do for her mother but that she should try to see him while she is inpatient as he as been there everyday since she was admitted. Also that I will send him a message with her concerns when he answers me I will call her or he may just call himself as he sometimes does when he is going through his notes. She advised she will try to catch him when he visits but wants me to send the message just in case she misses him. Since she is not sure when her mother will be D/C advised her to give the office a call to schedule once she is if the discharge nurse does not give her a follow up appt.  Ms Quentin Cornwall [phone]

## 2013-12-03 NOTE — Progress Notes (Signed)
[facility] PROGRESS NOTE  [name]:[id number] DOB: Aug 20, 1940 DOA: 11/27/2013 PCP: Hollace Kinnier, DO  Assessment/Plan: 1. Atrial fibrillation rapid ventricular response -Heart rates remained in the 90s to 100's -She is on metoprolol 12.5 mg by mouth twice a day and amiodarone 200 mg by mouth daily -Anticoagulated with warfarin, pharmacy consultation for warfarin management. -INR trending down to 3.19 -Last transthoracic echocardiogram performed on 08/16/2013 which showed an ejection fraction of 60-65% without wall motion abnormalities.  Patient having a history of chronic A. Fib  2.  History of prostatic valve endocarditis. -Dr. Johnnye Sima of infectious disease consulted, she is currently on IV Zosyn -Plan to discharge her on amoxicillin per infectious disease  3.  C Diff Colitis -Patient seen and evaluated by infectious disease, plan on 6 weeks of by mouth vancomycin taper  4. Chronic diastolic congestive heart failure -Last transthoracic echocardiogram performed on 11/16/2013 showing ejection fraction of 60-65%  5. Mental status change -History afternoon patient having mental status changes, family members expressing concern. -She was worked up with CT scan of brain which did not show acute intracranial abnormalities -Vitals were checked, patient hemodynamically stable. Could be secondary to hospital-acquired delirium -She appears improved on this mornings lab work  Code Status: Full code Family Communication:  Disposition Plan:    Consultants:  Infectious disease  Antibiotics:  Vancomycin by mouth  IV Zosyn  HPI/Subjective: Patient is a pleasant 73-year-old female with a history of recurrent endocarditis, chronic atrial fibrillation, blood pressure like aortic valve was admitted to the medicine service on 11/28/2013, patient presenting with nausea and vomiting. She says be in A. fib with RVR, which likely precipitated acute decompensated congestive heart  failure. Infectious disease was consulted given history of endocarditis.   Patient appears improved this morning, tolerated her breakfast    Objective: Filed Vitals:   12/03/13 0639  BP: 115/60  Pulse: 94  Temp: 98.4 F (36.9 C)  Resp:     Intake/Output Summary (Last 24 hours) at 12/03/13 1151 Last data filed at 12/03/13 0000  Gross per 24 hour  Intake    140 ml  Output    200 ml  Net    -60 ml   Filed Weights   12/01/13 1227 12/02/13 0552 12/03/13 0421  Weight: 78.8 kg (173 lb 11.6 oz) 77.61 kg (171 lb 1.6 oz) 78.291 kg (172 lb 9.6 oz)    Exam:   General:  Patient is in no acute distress, awake alert, tolerated by mouth  Cardiovascular: Regular rate and rhythm normal S1-S2  Respiratory: Clear to auscultation bilaterally  Abdomen: Soft nontender nondistended  Musculoskeletal: No edema  Data Reviewed: Basic Metabolic Panel:  Recent Labs Lab 11/27/13 2108 11/28/13 0550 11/30/13 0608 12/01/13 0330 12/03/13 0600  NA 137 135* 138 138 136*  K 5.5* 5.2 4.6 4.7 5.0  CL 101 102 105 103 103  CO2 20 16* 22 23 19   GLUCOSE 100* 89 90 80 71  BUN 19 21 20 18 17   CREATININE 1.48* 1.54* 1.59* 1.52* 1.58*  CALCIUM 9.1 8.8 8.5 8.5 8.7   Liver Function Tests:  Recent Labs Lab 11/27/13 2108 11/28/13 0550  AST 30 26  ALT 17 15  ALKPHOS 225* 184*  BILITOT 1.4* 1.5*  PROT 7.0 6.1  ALBUMIN 2.8* 2.5*   No results found for this basename: LIPASE, AMYLASE,  in the last 168 hours No results found for this basename: AMMONIA,  in the last 168 hours CBC:  Recent Labs Lab 11/27/13 2108 11/28/13  0550 11/29/13 0500 12/01/13 0330 12/02/13 0645 12/03/13 0600  WBC 7.0 4.9 3.4* 2.9* 4.2 3.4*  NEUTROABS 4.5 3.1  --   --   --   --   HGB 13.0 11.5* 11.8* 11.3* 11.1* 11.0*  HCT 40.4 34.3* 36.9 36.0 34.7* 35.0*  MCV 100.5* 96.6 100.0 98.4 99.7 99.4  PLT 117* 99* 103* 101* 123* 111*   Cardiac Enzymes:  Recent Labs Lab 11/27/13 2111 11/28/13 0550 11/28/13 0900  11/28/13 1525  TROPONINI <0.30 <0.30 <0.30 <0.30   BNP (last 3 results)  Recent Labs  11/07/13 1000 11/14/13 0923 11/27/13 2111  PROBNP 24587.0* 13807.0* 7774.0*   CBG:  Recent Labs Lab 11/28/13 0255  GLUCAP 90    Recent Results (from the past 240 hour(s))  CULTURE, BLOOD (ROUTINE X 2)     Status: None   Collection Time    11/27/13  9:04 PM      Result Value Ref Range Status   Specimen Description BLOOD LEFT ARM   Final   Special Requests     Final   Value: BOTTLES DRAWN AEROBIC AND ANAEROBIC 8CC BLUE,6CC RED   Culture  Setup Time     Final   Value: 11/28/2013 10:18     Performed at [facility]   Culture     Final   Value:        BLOOD CULTURE RECEIVED NO GROWTH TO DATE CULTURE WILL BE HELD FOR 5 DAYS BEFORE ISSUING A FINAL NEGATIVE REPORT     Note: Culture results may be compromised due to an excessive volume of blood received in culture bottles.     Performed at [facility]   Report Status PENDING   Incomplete  CULTURE, BLOOD (ROUTINE X 2)     Status: None   Collection Time    11/27/13 10:02 PM      Result Value Ref Range Status   Specimen Description BLOOD LEFT HAND   Final   Special Requests BOTTLES DRAWN AEROBIC ONLY 5CC   Final   Culture  Setup Time     Final   Value: 11/28/2013 10:17     Performed at [facility]   Culture     Final   Value:        BLOOD CULTURE RECEIVED NO GROWTH TO DATE CULTURE WILL BE HELD FOR 5 DAYS BEFORE ISSUING A FINAL NEGATIVE REPORT     Performed at [facility]   Report Status PENDING   Incomplete  MRSA PCR SCREENING     Status: None   Collection Time    11/28/13  2:46 AM      Result Value Ref Range Status   MRSA by PCR NEGATIVE  NEGATIVE Final   Comment:            The GeneXpert MRSA Assay (FDA     approved for NASAL specimens     only), is one component of a     comprehensive MRSA colonization     surveillance program. It is not     intended to diagnose MRSA     infection nor to guide  or     monitor treatment for     MRSA infections.  CLOSTRIDIUM DIFFICILE BY PCR     Status: None   Collection Time    11/30/13  3:07 PM      Result Value Ref Range Status   C difficile by pcr NEGATIVE  NEGATIVE Final     Studies:  Ct Head Wo Contrast  12/02/2013   CLINICAL DATA:  Altered mental status, confusion, weakness  EXAM: CT HEAD WITHOUT CONTRAST  TECHNIQUE: Contiguous axial images were obtained from the base of the skull through the vertex without intravenous contrast.  COMPARISON:  MRI brain dated 11/14/2013  FINDINGS: No evidence of parenchymal hemorrhage or extra-axial fluid collection. No mass lesion, mass effect, or midline shift.  No CT evidence of acute infarction.  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  Extensive small vessel ischemic changes. Intracranial atherosclerosis.  Global cortical atrophy.  No ventriculomegaly.  The visualized paranasal sinuses are essentially clear. The mastoid air cells are unopacified.  No evidence of calvarial fracture.  IMPRESSION: No evidence of acute intracranial abnormality.  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  Atrophy with extensive small vessel ischemic changes and intracranial atherosclerosis.   Electronically Signed   By: Julian Hy M.D.   On: 12/02/2013 20:26    Scheduled Meds: . amiodarone  200 mg Oral Daily  . atorvastatin  20 mg Oral q1800  . dronabinol  2.5 mg Oral BID AC  . famotidine  20 mg Oral BID  . feeding supplement (ENSURE COMPLETE)  237 mL Oral TID BM  . furosemide  40 mg Oral Daily  . gabapentin  300 mg Oral QHS  . hydroxychloroquine  200 mg Oral BID  . metoprolol tartrate  12.5 mg Oral BID  . mirtazapine  7.5 mg Oral QHS  . piperacillin-tazobactam (ZOSYN)  IV  3.375 g Intravenous 3 times per day  . potassium chloride  30 mEq Oral BID  . saccharomyces boulardii  250 mg Oral BID  . sodium chloride  10-40 mL Intracatheter Q12H  . sodium chloride  3 mL Intravenous Q12H  . vancomycin   125 mg Oral Q12H  . Warfarin - Pharmacist Dosing Inpatient   Does not apply q1800   Continuous Infusions:   Principal Problem:   Atrial fibrillation with RVR Active Problems:   Prosthetic valve endocarditis   MIXED CONNECTIVE TISSUE DISEASE   CKD (chronic kidney disease) stage 3, GFR 30-59 ml/min   Enteritis due to Clostridium difficile   CHF (congestive heart failure)   Nausea & vomiting    Time spent: 30 minutes    Kelvin Cellar  [facility] Pager [phone]. If 7PM-7AM, please contact night-coverage at [email or web address], password [facility] 12/03/2013, 11:51 AM  LOS: 6 days

## 2013-12-03 NOTE — Progress Notes (Signed)
ANTICOAGULATION AND ANTIBIOTIC CONSULT NOTE - Follow Up Consult  Pharmacy Consult for warfarin; Zosyn Indication: atrial fibrillation; r/o sepsis  Allergies  Allergen Reactions  . Ceftriaxone     RASH BUT TOLERATED AMPICILLIN AND IMIPENEM WITHOUT PROBLEMS  . Codeine Nausea Only    Patient Measurements: Height: 5\' 7"  (170.2 cm) Weight: 172 lb 9.6 oz (78.291 kg) (bed scale) IBW/kg (Calculated) : 61.6  Vital Signs: Temp: 98.4 F (36.9 C) (09/10 0639) Temp src: Oral (09/10 0639) BP: 115/60 mmHg (09/10 0639) Pulse Rate: 94 (09/10 0639)  Labs:  Recent Labs  12/01/13 0330 12/02/13 0645 12/03/13 0600  HGB 11.3* 11.1* 11.0*  HCT 36.0 34.7* 35.0*  PLT 101* 123* 111*  LABPROT 34.1* 35.3* 32.7*  INR 3.37* 3.52* 3.19*  CREATININE 1.52*  --  1.58*    Estimated Creatinine Clearance: 33.2 ml/min (by C-G formula based on Cr of 1.58).    Assessment: 73 y/o female on chronic warfarin for Afib. INR is supratherapeutic at 3.19 but trending down so will resume tonight.. No bleeding noted, CBC stable.  PTA: 4 mg daily  Also continues on day 6 Zosyn that was started to r/o sepsis. Patient has a hx of Enterococcus endocarditis with plans to resume prophylactic amoxicillin once off antibiotics. Currently WBC are normal at 3.4, she is afebrile, and renal function is stable. Cultures are ngtd.  Goal of Therapy:  INR 2-3 Monitor platelets by anticoagulation protocol: Yes   Plan:  - Warfarin 2 mg PO tonight - INR daily  - Continue Zosyn 3.375 g IV q8h (infused over 4 hrs) - Can this be stopped? Unsure what we are treating at this point  [facility], [place].D., BCPS Clinical Pharmacist Pager: [phone] 12/03/2013 1:15 PM

## 2013-12-04 ENCOUNTER — Inpatient Hospital Stay (HOSPITAL_COMMUNITY): Payer: Medicare Other

## 2013-12-04 DIAGNOSIS — E43 Unspecified severe protein-calorie malnutrition: Secondary | ICD-10-CM

## 2013-12-04 LAB — CBC
HCT: 36.3 % (ref 36.0–46.0)
Hemoglobin: 11.8 g/dL — ABNORMAL LOW (ref 12.0–15.0)
MCH: 31.5 pg (ref 26.0–34.0)
MCHC: 32.5 g/dL (ref 30.0–36.0)
MCV: 96.8 fL (ref 78.0–100.0)
Platelets: 117 10*3/uL — ABNORMAL LOW (ref 150–400)
RBC: 3.75 MIL/uL — ABNORMAL LOW (ref 3.87–5.11)
RDW: 16.1 % — ABNORMAL HIGH (ref 11.5–15.5)
WBC: 4.1 10*3/uL (ref 4.0–10.5)

## 2013-12-04 LAB — CULTURE, BLOOD (ROUTINE X 2)
CULTURE: NO GROWTH
Culture: NO GROWTH

## 2013-12-04 LAB — BASIC METABOLIC PANEL
Anion gap: 14 (ref 5–15)
BUN: 17 mg/dL (ref 6–23)
CO2: 21 mEq/L (ref 19–32)
Calcium: 8.6 mg/dL (ref 8.4–10.5)
Chloride: 104 mEq/L (ref 96–112)
Creatinine, Ser: 1.56 mg/dL — ABNORMAL HIGH (ref 0.50–1.10)
GFR calc Af Amer: 36 mL/min — ABNORMAL LOW (ref >90)
GFR, EST NON AFRICAN AMERICAN: 31 mL/min — AB (ref >90)
Glucose, Bld: 82 mg/dL (ref 70–99)
POTASSIUM: 3.7 meq/L (ref 3.7–5.3)
Sodium: 139 mEq/L (ref 137–147)

## 2013-12-04 LAB — PROTIME-INR
INR: 3.38 — ABNORMAL HIGH (ref 0.00–1.49)
Prothrombin Time: 34.2 seconds — ABNORMAL HIGH (ref 11.6–15.2)

## 2013-12-04 LAB — PRO B NATRIURETIC PEPTIDE: PRO B NATRI PEPTIDE: 6091 pg/mL — AB (ref 0–450)

## 2013-12-04 MED ORDER — ENSURE COMPLETE PO LIQD: 237.0000 mL | ORAL | Status: DC

## 2013-12-04 MED ORDER — ENSURE PUDDING PO PUDG: 1.0000 | Freq: Two times a day (BID) | ORAL | Status: DC

## 2013-12-04 MED ORDER — FUROSEMIDE 10 MG/ML IJ SOLN
20.0000 mg | Freq: Once | INTRAMUSCULAR | Status: AC
  Administered 2013-12-04: 20 mg via INTRAVENOUS

## 2013-12-04 NOTE — Progress Notes (Signed)
Patient is very Public house manager. Coralyn Pear made aware . Son at bedside. Dr.Zamora in to talk with him.

## 2013-12-04 NOTE — Care Management Note (Addendum)
    Page 1 of 1   12/07/2013     11:50:10 AM CARE MANAGEMENT NOTE 12/07/2013  Patient:  [name],[name]   Account Number:  [id number]  Date Initiated:  11/30/2013  Documentation initiated by:  Ricki Miller  Subjective/Objective Assessment:   Low O2, sepsis, endocarditis     Action/Plan:   Anticipated DC Date:  12/04/2013   Anticipated DC Plan:  SKILLED NURSING FACILITY  In-house referral  Clinical Social Worker      DC Planning Services  CM consult      Choice offered to / List presented to:             Status of service:  Completed, signed off Medicare Important Message given?  YES (If response is "NO", the following Medicare IM given date fields will be blank) Date Medicare IM given:  11/30/2013 Medicare IM given by:  Ricki Miller Date Additional Medicare IM given:  12/04/2013 Additional Medicare IM given by:  CRYSTAL HUTCHINSON  Discharge Disposition:  [place]  Per UR Regulation:  Reviewed for med. necessity/level of care/duration of stay  If discussed at [place] of Stay Meetings, dates discussed:    Comments:  12/07/13 Ebony, RN, BSN, NCM [phone] Additional Medicare IM given to pt. Pt to d/c to [facility] with palliative care services.  Crystal Hutchinson RN, BSN, MSHL, CCM  Nurse - Case Manager,  (Unit [phone]  12/04/2013 Social:  From SNF/[facility] Patient with increased lethargy today. Palliative Consult:  Anticipate hospice referral for inpt v.s residential / Family decision pending.

## 2013-12-04 NOTE — Progress Notes (Signed)
UR completed Davian Hanshaw K. Alayia Meggison, RN, BSN, [place], CCM  12/04/2013 6:04 PM

## 2013-12-04 NOTE — Progress Notes (Signed)
NUTRITION FOLLOW UP  Intervention:   Continue Ensure Complete once daily Provide Ensure Pudding BID RD to continue to monitor  Nutrition Dx:   Inadequate oral intake related to poor appetite as evidenced by poor intake of meals; ongoing  Goal:   Pt to meet >/= 90% of their estimated nutrition needs; unmet  Monitor:   Diet advancement, PO intake, labs, weight trend  Assessment:   73 y.o. female with history of recurrent endocarditis, chronic atrial fibrillation, bioprosthetic aortic valve, CHF, recently admitted for CVA, recently treated for C. difficile colitis and is on oral vancomycin, CAD status post CABG, lupus was brought to the ER because of nausea and vomiting.   9/11: Pt asleep at time of visit. Her weight has trended up 4 lbs in the past week. +1.3 L fluid balance since admission. Per nursing notes pt is eating 25-35% of most meals. Per order history pt is drinking/accepting about half of Ensure supplements. Pt had 2 unopened Ensure bottles at bedside at time of visit as well as candy and doughnuts.  Labs reviewed: Low hemoglobin, low GFR, low sodium  9/5: Multiple recent hospitalizations noted. Patient recently eating very poorly due to poor appetite. Drinks Ensure at home, but doesn't really like it. Per discussion with daughter-in-law, patient has had minimal intake for the past 7-10 days. She goes through periods of eating well and eating poorly. Has done well on Megace in the past, but per discussion with physician, patient not a candidate for Megace due to clot in her lung.   Height: Ht Readings from Last 1 Encounters:  12/01/13 5\' 7"  (1.702 m)    Weight Status:   Wt Readings from Last 1 Encounters:  12/04/13 171 lb 11.3 oz (77.885 kg)    Re-estimated needs:  Kcal: 1550-1750  Protein: 80-90 gm  Fluid: 1.6-1.8 L  Skin: intact; +1 RUE edema, +2 RLE and LLE edema  Diet Order: General   Intake/Output Summary (Last 24 hours) at 12/04/13 1114 Last data filed at  12/04/13 1022  Gross per 24 hour  Intake    680 ml  Output      0 ml  Net    680 ml    Last BM: 9/10 x 2 loose, watery   Labs:   Recent Labs Lab 11/30/13 0608 12/01/13 0330 12/03/13 0600  NA 138 138 136*  K 4.6 4.7 5.0  CL 105 103 103  CO2 22 23 19   BUN 20 18 17   CREATININE 1.59* 1.52* 1.58*  CALCIUM 8.5 8.5 8.7  GLUCOSE 90 80 71    CBG (last 3)  No results found for this basename: GLUCAP,  in the last 72 hours  Scheduled Meds: . amiodarone  200 mg Oral Daily  . atorvastatin  20 mg Oral q1800  . dronabinol  2.5 mg Oral BID AC  . famotidine  20 mg Oral BID  . feeding supplement (ENSURE COMPLETE)  237 mL Oral TID BM  . furosemide  60 mg Oral Daily  . gabapentin  300 mg Oral QHS  . hydroxychloroquine  200 mg Oral BID  . metoprolol tartrate  12.5 mg Oral BID  . mirtazapine  7.5 mg Oral QHS  . piperacillin-tazobactam (ZOSYN)  IV  3.375 g Intravenous 3 times per day  . potassium chloride  30 mEq Oral BID  . saccharomyces boulardii  250 mg Oral BID  . sodium chloride  10-40 mL Intracatheter Q12H  . sodium chloride  3 mL Intravenous Q12H  . vancomycin  125 mg Oral Q12H  . Warfarin - Pharmacist Dosing Inpatient   Does not apply q1800    Continuous Infusions:   Pryor Ochoa RD, LDN Inpatient Clinical Dietitian Pager: [phone] After Hours Pager: [phone]

## 2013-12-04 NOTE — Clinical Social Work Placement (Addendum)
Clinical Social Work Department CLINICAL SOCIAL WORK PLACEMENT NOTE 12/04/2013  Patient:  [name], [name]  Account Number:  [id number] Admit date:  11/27/2013  Clinical Social Worker:  Charlene Brooke, LCSW  Date/time:  12/04/2013 01:06 PM  Clinical Social Work is seeking post-discharge placement for this patient at the following level of care:   SKILLED NURSING   (*CSW will update this form in Epic as items are completed)   12/04/2013  Patient/family provided with [place] Department of Clinical Social Work's list of facilities offering this level of care within the geographic area requested by the patient (or if unable, by the patient's family).  12/04/2013  Patient/family informed of their freedom to choose among providers that offer the needed level of care, that participate in Medicare, Medicaid or managed care program needed by the patient, have an available bed and are willing to accept the patient.  12/04/2013  Patient/family informed of [facility]' ownership interest in [facility], as well as of the fact that they are under no obligation to receive care at this facility.  PASARR submitted to EDS on 12/04/2013 PASARR number received on 12/04/2013  FL2 transmitted to all facilities in geographic area requested by pt/family on  12/04/2013 FL2 transmitted to all facilities within larger geographic area on 12/04/2013  Patient informed that his/her managed care company has contracts with or will negotiate with  certain facilities, including the following:     Patient/family informed of bed offers received:  12/04/2013 Patient chooses bed at [facility] Physician recommends and patient chooses bed at    Patient to be transferred to 12/08/2013 on  [facility] Say[facility], [place]) Patient to be transferred to facility by [facility] Sayres, [place]) Patient and family notified of transfer on  12/08/2013 Luber[facility] Sayres, Latanya Presser) Name of family  member notified:  Pt's daughter, Cecilio Asper, updated via phone. [place])  The following physician request were entered in Epic:   Additional Comments:   Charlene Brooke, MSW Clinical Social Worker [phone], [place] [phone]) Licensed Clinical Social Worker Neuroscience [phone]) and Medical ICU (63M)

## 2013-12-04 NOTE — Progress Notes (Signed)
ANTICOAGULATION CONSULT NOTE - Follow Up Consult  Pharmacy Consult for Coumadin Indication: atrial fibrillation  Allergies  Allergen Reactions  . Ceftriaxone     RASH BUT TOLERATED AMPICILLIN AND IMIPENEM WITHOUT PROBLEMS  . Codeine Nausea Only    Patient Measurements: Height: 5\' 7"  (170.2 cm) Weight: 171 lb 11.3 oz (77.885 kg) (Scale C) IBW/kg (Calculated) : 61.6 Heparin Dosing Weight:   Vital Signs: Temp: 97.8 F (36.6 C) (09/11 0530) Temp src: Oral (09/11 0530) BP: 108/62 mmHg (09/11 0530) Pulse Rate: 88 (09/11 0530)  Labs:  Recent Labs  12/02/13 0645 12/03/13 0600 12/04/13 0556  HGB 11.1* 11.0*  --   HCT 34.7* 35.0*  --   PLT 123* 111*  --   LABPROT 35.3* 32.7* 34.2*  INR 3.52* 3.19* 3.38*  CREATININE  --  1.58*  --     Estimated Creatinine Clearance: 33.1 ml/min (by C-G formula based on Cr of 1.58).   Medications:  Scheduled:  . amiodarone  200 mg Oral Daily  . atorvastatin  20 mg Oral q1800  . dronabinol  2.5 mg Oral BID AC  . famotidine  20 mg Oral BID  . [START ON 12/05/2013] feeding supplement (ENSURE COMPLETE)  237 mL Oral Q24H  . feeding supplement (ENSURE)  1 Container Oral BID PC  . furosemide  60 mg Oral Daily  . gabapentin  300 mg Oral QHS  . hydroxychloroquine  200 mg Oral BID  . metoprolol tartrate  12.5 mg Oral BID  . mirtazapine  7.5 mg Oral QHS  . piperacillin-tazobactam (ZOSYN)  IV  3.375 g Intravenous 3 times per day  . potassium chloride  30 mEq Oral BID  . saccharomyces boulardii  250 mg Oral BID  . sodium chloride  10-40 mL Intracatheter Q12H  . sodium chloride  3 mL Intravenous Q12H  . vancomycin  125 mg Oral Q12H  . Warfarin - Pharmacist Dosing Inpatient   Does not apply q1800    Assessment: 73yo female with AFib.  INR 3.38- up again following dose on 9/10.  No CBC today, no bleeding noted.    Goal of Therapy:  INR 2-3 Monitor platelets by anticoagulation protocol: Yes   Plan:  1-  No Coumadin today 2-  F/U in  AM  Gracy Bruins, PharmD Clinical Pharmacist [facility]

## 2013-12-04 NOTE — Progress Notes (Signed)
9/11 Met at bedside with daughter [name] and her significant other Juliann Pulse with whom the patient has lived for about the past year.  Patient is resting comfortably, occasionally verbalizing a word or two.  [name] and [place] describe multiple hospitalizations over the past several months after which the Ms. Glacken was only able to remain home briefly before declining again with re-admit.  The most recent d/c from [facility] was to [facility]. Ms. Biermann was there less than a week, mostly unable to participate in any rehabilitation activities, decrease appetite and return of cardiac sx, AF with RVR. Ms. Holts PO intake has been very poor recently and seems to be decreasing as the days pass.  Full discussion of artificial nutrition/hydration and disease process discussed.  [name] and [place] both expressed relief in hearing that these would not be painful processes and that Ms. Holts comfort and symptom management would be the goasl of care.  [name] states that at this point they realize that the burden of recovery is becoming to great for Ms. Zahler.  She also states that her mother discussed treating illness within the context of quality of life was important.   Dr. Coralyn Pear arrived during this discussion and indicated that cardiology felt that pursuing palliative care and comfort at this point is reasonable and they concurred with goals of care discussion.    I was able to discuss palliative care within the context of hospice.  Tye [place] verbalized that she volunteered for hospice many years ago and is comfortable with the concept.    Two other sisters and a brother are local and are meeting later this afternoon here at the hospital.  [name] is confident that her siblings will be in agreement with the goals.    Although any significant changes in stopping antibiotics, and/or other medication will be deferred until after the full family discussion.   Anticipate hospice referral for inpt v.s residential

## 2013-12-04 NOTE — Progress Notes (Addendum)
CSW met with patient, daughter [name] and her significant other [name] this afternoon.  Bed offer received from [facility] which is facility of choice per patient and daughter; they do not plan for return to [facility].  MD has consulted Palliative Care and Joslyn Devon with Palliative Care is currently speaking with patient and daughter. CSW briefly discussed option of SNF with Hospice vs inpatient hospice care (if deemed appropriate.) If residential hospice is indicated- they want [facility] as first choice, [facility] as second choice.  Will await results of goals of care meeting with family.  Lorie Phenix. Pauline Good, [place]

## 2013-12-04 NOTE — Progress Notes (Signed)
[facility] PROGRESS NOTE  [name] IDP:[id number] DOB: [birth year] DOA: 11/27/2013 PCP: Hollace Kinnier, DO  Assessment/Plan: 1. Atrial fibrillation rapid ventricular response -Heart rates remained in the 90s to 100's -She is on metoprolol 12.5 mg by mouth twice a day and amiodarone 200 mg by mouth daily -Anticoagulated with warfarin, pharmacy consultation for warfarin management. -Last transthoracic echocardiogram performed on 08/16/2013 which showed an ejection fraction of 60-65% without wall motion abnormalities.   2.  History of prostatic valve endocarditis. -Dr. Johnnye Sima of infectious disease consulted, she is currently on IV Zosyn -Plan to discharge her on amoxicillin per infectious disease  3.  C Diff Colitis -Patient seen and evaluated by infectious disease, plan on 6 weeks of by mouth vancomycin taper  4. Chronic diastolic congestive heart failure -Last transthoracic echocardiogram performed on 11/16/2013 showing ejection fraction of 60-65%   6. Goals of Care  -I held family meeting this afternoon to discuss goals of care. Despite maximizing medical therapy she continues to decline, becoming more lethargic and having minimal PO intake today. She has had recurrent hospitalizations over the past month that has been associated with an overall progressive decline. Family stating that she would not want to undergo cardiopulmonary resuscitation, particularly in the current condition of advanced heart disease and the unlikelihood of obtaining a hopefull recovery. Family would like to focus care on comfort and interested in a Palliative Care consultation. I spoke to her Cardiologist Dr Terrence Dupont who agreed with Palliative Care Consultation.  -DNR order placed in chart.    Code Status: DNR Family Communication: Family meeting held Disposition Plan: Family meeting held this afternoon, palliative care consult, possible discharge to inpatient hospice.    Consultants:  Infectious  disease  Palliative Care  Antibiotics:  Vancomycin by mouth  IV Zosyn  HPI/Subjective: Patient is a pleasant 73-year-old female with a history of recurrent endocarditis, chronic atrial fibrillation, blood pressure like aortic valve was admitted to the medicine service on 11/28/2013, patient presenting with nausea and vomiting. She says be in A. fib with RVR, which likely precipitated acute decompensated congestive heart failure. Infectious disease was consulted given history of endocarditis.   Patient appears improved this morning, tolerated her breakfast    Objective: Filed Vitals:   12/04/13 1440  BP: 115/64  Pulse: 94  Temp: 97.7 F (36.5 C)  Resp: 20    Intake/Output Summary (Last 24 hours) at 12/04/13 1551 Last data filed at 12/04/13 1022  Gross per 24 hour  Intake    470 ml  Output      0 ml  Net    470 ml   Filed Weights   12/02/13 0552 12/03/13 0421 12/04/13 0530  Weight: 77.61 kg (171 lb 1.6 oz) 78.291 kg (172 lb 9.6 oz) 77.885 kg (171 lb 11.3 oz)    Exam:   General:  Patient is lethargic, though is able to follow a few simple commands  Cardiovascular: Regular rate and rhythm normal S1-S2  Respiratory: Clear to auscultation bilaterally  Abdomen: Soft nontender nondistended  Musculoskeletal: No edema  Data Reviewed: Basic Metabolic Panel:  Recent Labs Lab 11/28/13 0550 11/30/13 0608 12/01/13 0330 12/03/13 0600 12/04/13 1400  NA 135* 138 138 136* 139  K 5.2 4.6 4.7 5.0 3.7  CL 102 105 103 103 104  CO2 16* 22 23 19 21   GLUCOSE 89 90 80 71 82  BUN 21 20 18 17 17   CREATININE 1.54* 1.59* 1.52* 1.58* 1.56*  CALCIUM 8.8 8.5 8.5 8.7 8.6  Liver Function Tests:  Recent Labs Lab 11/27/13 2108 11/28/13 0550  AST 30 26  ALT 17 15  ALKPHOS 225* 184*  BILITOT 1.4* 1.5*  PROT 7.0 6.1  ALBUMIN 2.8* 2.5*   No results found for this basename: LIPASE, AMYLASE,  in the last 168 hours No results found for this basename: AMMONIA,  in the last 168  hours CBC:  Recent Labs Lab 11/27/13 2108 11/28/13 0550 11/29/13 0500 12/01/13 0330 12/02/13 0645 12/03/13 0600 12/04/13 1400  WBC 7.0 4.9 3.4* 2.9* 4.2 3.4* 4.1  NEUTROABS 4.5 3.1  --   --   --   --   --   HGB 13.0 11.5* 11.8* 11.3* 11.1* 11.0* 11.8*  HCT 40.4 34.3* 36.9 36.0 34.7* 35.0* 36.3  MCV 100.5* 96.6 100.0 98.4 99.7 99.4 96.8  PLT 117* 99* 103* 101* 123* 111* 117*   Cardiac Enzymes:  Recent Labs Lab 11/27/13 2111 11/28/13 0550 11/28/13 0900 11/28/13 1525  TROPONINI <0.30 <0.30 <0.30 <0.30   BNP (last 3 results)  Recent Labs  11/14/13 0923 11/27/13 2111 12/04/13 1400  PROBNP 13807.0* 7774.0* 6091.0*   CBG:  Recent Labs Lab 11/28/13 0255  GLUCAP 90    Recent Results (from the past 240 hour(s))  CULTURE, BLOOD (ROUTINE X 2)     Status: None   Collection Time    11/27/13  9:04 PM      Result Value Ref Range Status   Specimen Description BLOOD LEFT ARM   Final   Special Requests     Final   Value: BOTTLES DRAWN AEROBIC AND ANAEROBIC 8CC BLUE,6CC RED   Culture  Setup Time     Final   Value: 11/28/2013 10:18     Performed at [facility]   Culture     Final   Value: NO GROWTH 5 DAYS     Note: Culture results may be compromised due to an excessive volume of blood received in culture bottles.     Performed at [facility]   Report Status 12/04/2013 FINAL   Final  CULTURE, BLOOD (ROUTINE X 2)     Status: None   Collection Time    11/27/13 10:02 PM      Result Value Ref Range Status   Specimen Description BLOOD LEFT HAND   Final   Special Requests BOTTLES DRAWN AEROBIC ONLY 5CC   Final   Culture  Setup Time     Final   Value: 11/28/2013 10:17     Performed at [facility]   Culture     Final   Value: NO GROWTH 5 DAYS     Performed at [facility]   Report Status 12/04/2013 FINAL   Final  MRSA PCR SCREENING     Status: None   Collection Time    11/28/13  2:46 AM      Result Value Ref Range Status   MRSA by  PCR NEGATIVE  NEGATIVE Final   Comment:            The GeneXpert MRSA Assay (FDA     approved for NASAL specimens     only), is one component of a     comprehensive MRSA colonization     surveillance program. It is not     intended to diagnose MRSA     infection nor to guide or     monitor treatment for     MRSA infections.  CLOSTRIDIUM DIFFICILE BY PCR  Status: None   Collection Time    11/30/13  3:07 PM      Result Value Ref Range Status   C difficile by pcr NEGATIVE  NEGATIVE Final     Studies: Ct Head Wo Contrast  12/02/2013   CLINICAL DATA:  Altered mental status, confusion, weakness  EXAM: CT HEAD WITHOUT CONTRAST  TECHNIQUE: Contiguous axial images were obtained from the base of the skull through the vertex without intravenous contrast.  COMPARISON:  MRI brain dated 11/14/2013  FINDINGS: No evidence of parenchymal hemorrhage or extra-axial fluid collection. No mass lesion, mass effect, or midline shift.  No CT evidence of acute infarction.  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  Extensive small vessel ischemic changes. Intracranial atherosclerosis.  Global cortical atrophy.  No ventriculomegaly.  The visualized paranasal sinuses are essentially clear. The mastoid air cells are unopacified.  No evidence of calvarial fracture.  IMPRESSION: No evidence of acute intracranial abnormality.  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  Atrophy with extensive small vessel ischemic changes and intracranial atherosclerosis.   Electronically Signed   By: Julian Hy M.D.   On: 12/02/2013 20:26   Dg Chest Port 1 View  12/04/2013   CLINICAL DATA:  Shortness of breath.  EXAM: PORTABLE CHEST - 1 VIEW  COMPARISON:  11/28/2013 and CT angio chest 11/27/2013 and earlier priors.  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 Stable chronic moderate cardiac enlargement. Heavy mitral annulus calcifications are noted. There is diffuse bilateral interstitial  prominence, similar to recent chest radiographs of 11/15/2013 and 11/16/2013. Pulmonary vascularity appears slightly prominent. No consolidation. No visible pleural effusion. Negative for pneumothorax.  Right upper extremity PICC appears stable, projecting over the mid SVC.  IMPRESSION: Stable chronic moderate cardiomegaly. Diffuse interstitial prominence may reflect interstitial pulmonary edema.   Electronically Signed   By: Curlene Dolphin M.D.   On: 12/04/2013 12:37    Scheduled Meds: . amiodarone  200 mg Oral Daily  . atorvastatin  20 mg Oral q1800  . famotidine  20 mg Oral BID  . [START ON 12/05/2013] feeding supplement (ENSURE COMPLETE)  237 mL Oral Q24H  . feeding supplement (ENSURE)  1 Container Oral BID PC  . furosemide  60 mg Oral Daily  . gabapentin  300 mg Oral QHS  . hydroxychloroquine  200 mg Oral BID  . metoprolol tartrate  12.5 mg Oral BID  . piperacillin-tazobactam (ZOSYN)  IV  3.375 g Intravenous 3 times per day  . potassium chloride  30 mEq Oral BID  . saccharomyces boulardii  250 mg Oral BID  . sodium chloride  10-40 mL Intracatheter Q12H  . sodium chloride  3 mL Intravenous Q12H  . vancomycin  125 mg Oral Q12H  . Warfarin - Pharmacist Dosing Inpatient   Does not apply q1800   Continuous Infusions:   Principal Problem:   Atrial fibrillation with RVR Active Problems:   Prosthetic valve endocarditis   MIXED CONNECTIVE TISSUE DISEASE   CKD (chronic kidney disease) stage 3, GFR 30-59 ml/min   Enteritis due to Clostridium difficile   CHF (congestive heart failure)   Nausea & vomiting    Time spent: 45 minutes    Kelvin Cellar  [facility] Pager [phone]. If 7PM-7AM, please contact night-coverage at [email or web address], password [facility] 12/04/2013, 3:51 PM  LOS: 7 days

## 2013-12-04 NOTE — Consult Note (Signed)
[place] Liaison: Received request from [place] for family interest in [facility]. Chart reviewed and spoke with Palliative RN Joslyn Devon re goals of care. Await outcome of family meeting this evening when rest of family arrives. Based on information shared by Joslyn Devon, [place] anticipates having room for patient when ready for discharge. Thank you. Erling Conte LCSW [phone]

## 2013-12-04 NOTE — Progress Notes (Signed)
Not able to give am meds patient is very sleepy and hard to keep awake as stated MD aware see above notes

## 2013-12-04 NOTE — Clinical Social Work Note (Signed)
Clinical Social Work Department BRIEF PSYCHOSOCIAL ASSESSMENT 12/04/2013  Patient:  Spadafore,Nargis A     Account Number:  [id number]     Admit date:  11/27/2013  Clinical Social Worker:  Jennette Dubin  Date/Time:  12/04/2013 01:11 PM  Referred by:  Physician  Date Referred:  12/04/2013 Referred for  SNF Placement   Other Referral:   N/A   Interview type:  Family Other interview type:   N/A    PSYCHOSOCIAL DATA Living Status:   Admitted from facility:   Level of care:   Primary support name:  Robin Yinger,HCPOA Primary support relationship to patient:  CHILD, ADULT Degree of support available:   Good    CURRENT CONCERNS  Other Concerns:  N/A  SOCIAL WORK ASSESSMENT / PLAN Pt admitted to hospital on 11/27/13 due to nausea and vomiting. Pt was at [facility] when the nausea and vomiting occured. Pt will not return to [place]. Pt.'s family has requested [facility] as a first choice but gave SW permission to fax pt information to other facilities. Pt has a very supportive family who are at bedside.   Assessment/plan status:  Psychosocial Support/Ongoing Assessment of Needs Other assessment/ plan:   N/A   Information/referral to community resources:   N/A    PATIENT'S/FAMILY'S RESPONSE TO PLAN OF CARE: Pt unable to speak due to medical condition. SW role explained, pt.'s family voiced understanding. SW will continue to follow.    Charlene Brooke, MSW Clinical Social Worker [phone]

## 2013-12-05 DIAGNOSIS — M359 Systemic involvement of connective tissue, unspecified: Secondary | ICD-10-CM

## 2013-12-05 LAB — PROTIME-INR
INR: 3.27 — ABNORMAL HIGH (ref 0.00–1.49)
Prothrombin Time: 33.3 seconds — ABNORMAL HIGH (ref 11.6–15.2)

## 2013-12-05 MED ORDER — FUROSEMIDE 10 MG/ML IJ SOLN
20.0000 mg | Freq: Every day | INTRAMUSCULAR | Status: DC
  Administered 2013-12-05 – 2013-12-06 (×2): 20 mg via INTRAVENOUS
  Filled 2013-12-05 (×2): qty 2

## 2013-12-05 MED ORDER — ALUM & MAG HYDROXIDE-SIMETH 200-200-20 MG/5ML PO SUSP: 15.0000 mL | Freq: Four times a day (QID) | ORAL | Status: DC | PRN

## 2013-12-05 NOTE — Progress Notes (Addendum)
[facility] PROGRESS NOTE  [name] XFG:[id number] DOB: [birth year] DOA: 11/27/2013 PCP: Hollace Kinnier, DO  Assessment/Plan: 1. Atrial fibrillation rapid ventricular response -Heart rates remained in the 90s to 100's -She is on metoprolol 12.5 mg by mouth twice a day and amiodarone 200 mg by mouth daily -Anticoagulated with warfarin, pharmacy consultation for warfarin management. -Last transthoracic echocardiogram performed on 08/16/2013 which showed an ejection fraction of 60-65% without wall motion abnormalities.   2.  History of prostatic valve endocarditis. -Dr. Johnnye Sima of infectious disease consulted, she is currently on IV Zosyn -Plan to discharge her on amoxicillin per infectious disease  3.  C Diff Colitis -Patient seen and evaluated by infectious disease, plan on 6 weeks of by mouth vancomycin taper  4. Chronic diastolic congestive heart failure -Last transthoracic echocardiogram performed on 11/16/2013 showing ejection fraction of 60-65%   6. Goals of Care  -I held family meeting this afternoon to discuss goals of care. Despite maximizing medical therapy she continues to decline, becoming more lethargic and having minimal PO intake today. She has had recurrent hospitalizations over the past month that has been associated with an overall progressive decline. Family stating that she would not want to undergo cardiopulmonary resuscitation, particularly in the current condition of advanced heart disease and the unlikelihood of obtaining a hopefull recovery. Family would like to focus care on comfort and interested in a Palliative Care consultation. I spoke to her Cardiologist Dr Terrence Dupont who agreed with Palliative Care Consultation. Likely be discharged to [facility] inpatient hospice -DNR order placed in chart.    Code Status: DNR Family Communication: Family meeting held Disposition Plan: Anticipate discharge to [facility] inpatient hospice.     Consultants:  Infectious disease  Palliative Care  Antibiotics:  Vancomycin by mouth  IV Zosyn  HPI/Subjective: Patient is a pleasant 73-year-old female with a history of recurrent endocarditis, chronic atrial fibrillation, blood pressure like aortic valve was admitted to the medicine service on 11/28/2013, patient presenting with nausea and vomiting. She says be in A. fib with RVR, which likely precipitated acute decompensated congestive heart failure. Infectious disease was consulted given history of endocarditis.   Patient remains minimally responsive, appears comfortable, no evidence of distress.     Objective: Filed Vitals:   12/05/13 1025  BP: 122/55  Pulse: 100  Temp:   Resp: 16    Intake/Output Summary (Last 24 hours) at 12/05/13 1420 Last data filed at 12/04/13 1845  Gross per 24 hour  Intake      0 ml  Output      0 ml  Net      0 ml   Filed Weights   12/03/13 0421 12/04/13 0530 12/05/13 0525  Weight: 78.291 kg (172 lb 9.6 oz) 77.885 kg (171 lb 11.3 oz) 77.202 kg (170 lb 3.2 oz)    Exam:   General:  Patient is lethargic, though is able to follow a few simple commands  Cardiovascular: Regular rate and rhythm normal S1-S2  Respiratory: Clear to auscultation bilaterally  Abdomen: Soft nontender nondistended  Musculoskeletal: No edema  Data Reviewed: Basic Metabolic Panel:  Recent Labs Lab 11/30/13 0608 12/01/13 0330 12/03/13 0600 12/04/13 1400  NA 138 138 136* 139  K 4.6 4.7 5.0 3.7  CL 105 103 103 104  CO2 22 23 19 21   GLUCOSE 90 80 71 82  BUN 20 18 17 17   CREATININE 1.59* 1.52* 1.58* 1.56*  CALCIUM 8.5 8.5 8.7 8.6   Liver Function Tests: No results  found for this basename: AST, ALT, ALKPHOS, BILITOT, PROT, ALBUMIN,  in the last 168 hours No results found for this basename: LIPASE, AMYLASE,  in the last 168 hours No results found for this basename: AMMONIA,  in the last 168 hours CBC:  Recent Labs Lab 11/29/13 0500  12/01/13 0330 12/02/13 0645 12/03/13 0600 12/04/13 1400  WBC 3.4* 2.9* 4.2 3.4* 4.1  HGB 11.8* 11.3* 11.1* 11.0* 11.8*  HCT 36.9 36.0 34.7* 35.0* 36.3  MCV 100.0 98.4 99.7 99.4 96.8  PLT 103* 101* 123* 111* 117*   Cardiac Enzymes:  Recent Labs Lab 11/28/13 1525  TROPONINI <0.30   BNP (last 3 results)  Recent Labs  11/14/13 0923 11/27/13 2111 12/04/13 1400  PROBNP 13807.0* 7774.0* 6091.0*   CBG: No results found for this basename: GLUCAP,  in the last 168 hours  Recent Results (from the past 240 hour(s))  CULTURE, BLOOD (ROUTINE X 2)     Status: None   Collection Time    11/27/13  9:04 PM      Result Value Ref Range Status   Specimen Description BLOOD LEFT ARM   Final   Special Requests     Final   Value: BOTTLES DRAWN AEROBIC AND ANAEROBIC 8CC BLUE,6CC RED   Culture  Setup Time     Final   Value: 11/28/2013 10:18     Performed at [facility]   Culture     Final   Value: NO GROWTH 5 DAYS     Note: Culture results may be compromised due to an excessive volume of blood received in culture bottles.     Performed at [facility]   Report Status 12/04/2013 FINAL   Final  CULTURE, BLOOD (ROUTINE X 2)     Status: None   Collection Time    11/27/13 10:02 PM      Result Value Ref Range Status   Specimen Description BLOOD LEFT HAND   Final   Special Requests BOTTLES DRAWN AEROBIC ONLY 5CC   Final   Culture  Setup Time     Final   Value: 11/28/2013 10:17     Performed at [facility]   Culture     Final   Value: NO GROWTH 5 DAYS     Performed at [facility]   Report Status 12/04/2013 FINAL   Final  MRSA PCR SCREENING     Status: None   Collection Time    11/28/13  2:46 AM      Result Value Ref Range Status   MRSA by PCR NEGATIVE  NEGATIVE Final   Comment:            The GeneXpert MRSA Assay (FDA     approved for NASAL specimens     only), is one component of a     comprehensive MRSA colonization     surveillance program. It is  not     intended to diagnose MRSA     infection nor to guide or     monitor treatment for     MRSA infections.  CLOSTRIDIUM DIFFICILE BY PCR     Status: None   Collection Time    11/30/13  3:07 PM      Result Value Ref Range Status   C difficile by pcr NEGATIVE  NEGATIVE Final     Studies: Dg Chest Port 1 View  12/04/2013   CLINICAL DATA:  Shortness of breath.  EXAM: PORTABLE CHEST - 1 VIEW  COMPARISON:  11/28/2013 and CT angio chest 11/27/2013 and earlier priors.  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 Stable chronic moderate cardiac enlargement. Heavy mitral annulus calcifications are noted. There is diffuse bilateral interstitial prominence, similar to recent chest radiographs of 11/15/2013 and 11/16/2013. Pulmonary vascularity appears slightly prominent. No consolidation. No visible pleural effusion. Negative for pneumothorax.  Right upper extremity PICC appears stable, projecting over the mid SVC.  IMPRESSION: Stable chronic moderate cardiomegaly. Diffuse interstitial prominence may reflect interstitial pulmonary edema.   Electronically Signed   By: Curlene Dolphin M.D.   On: 12/04/2013 12:37    Scheduled Meds: . amiodarone  200 mg Oral Daily  . feeding supplement (ENSURE COMPLETE)  237 mL Oral Q24H  . feeding supplement (ENSURE)  1 Container Oral BID PC  . furosemide  20 mg Intravenous Daily  . piperacillin-tazobactam (ZOSYN)  IV  3.375 g Intravenous 3 times per day  . sodium chloride  10-40 mL Intracatheter Q12H  . sodium chloride  3 mL Intravenous Q12H  . vancomycin  125 mg Oral Q12H  . Warfarin - Pharmacist Dosing Inpatient   Does not apply q1800   Continuous Infusions:   Principal Problem:   Atrial fibrillation with RVR Active Problems:   Prosthetic valve endocarditis   MIXED CONNECTIVE TISSUE DISEASE   CKD (chronic kidney disease) stage 3, GFR 30-59 ml/min   Enteritis due to Clostridium difficile   CHF (congestive heart failure)   Nausea &  vomiting    Time spent: 25 minutes    Kelvin Cellar  [facility] Pager [phone]. If 7PM-7AM, please contact night-coverage at [email or web address], password [facility] 12/05/2013, 2:20 PM  LOS: 8 days     Addendum:  Spoke with Dr Hilma Favors. Patient currently being treated for Cdiff with oral Vancomycin. If she is unable to swallow it would be reasonable to treat with IV flagyl as a comfort measure. Patient reassessed this afternoon, spoke with family, she appears to be less responsive this afternoon, overall continues to decline. Its possible she may pass in days to weeks.

## 2013-12-05 NOTE — Progress Notes (Signed)
Patient drowsy and lethargic throughout shift, minimally responsive, family at bedside.  Vital signs stable.  Patient able to take few PO meds with encouragement and small sips of water.  Otherwise, patient somnolent throughout shift.  Patient's family denies any questions or concerns at this time.  Will continue to monitor.

## 2013-12-05 NOTE — Progress Notes (Signed)
ANTICOAGULATION CONSULT NOTE - Follow Up Consult  Pharmacy Consult for Coumadin Indication: atrial fibrillation  Allergies  Allergen Reactions  . Ceftriaxone     RASH BUT TOLERATED AMPICILLIN AND IMIPENEM WITHOUT PROBLEMS  . Codeine Nausea Only    Patient Measurements: Height: 5\' 7"  (170.2 cm) Weight: 170 lb 3.2 oz (77.202 kg) IBW/kg (Calculated) : 61.6 Heparin Dosing Weight:   Vital Signs: Temp: 97.8 F (36.6 C) (09/12 0525) Temp src: Oral (09/12 0525) BP: 122/55 mmHg (09/12 1025) Pulse Rate: 100 (09/12 1025)  Labs:  Recent Labs  12/03/13 0600 12/04/13 0556 12/04/13 1400 12/05/13 0407  HGB 11.0*  --  11.8*  --   HCT 35.0*  --  36.3  --   PLT 111*  --  117*  --   LABPROT 32.7* 34.2*  --  33.3*  INR 3.19* 3.38*  --  3.27*  CREATININE 1.58*  --  1.56*  --     Estimated Creatinine Clearance: 33.4 ml/min (by C-G formula based on Cr of 1.56).   Medications:  Scheduled:  . amiodarone  200 mg Oral Daily  . feeding supplement (ENSURE COMPLETE)  237 mL Oral Q24H  . feeding supplement (ENSURE)  1 Container Oral BID PC  . furosemide  20 mg Intravenous Daily  . piperacillin-tazobactam (ZOSYN)  IV  3.375 g Intravenous 3 times per day  . sodium chloride  10-40 mL Intracatheter Q12H  . sodium chloride  3 mL Intravenous Q12H  . vancomycin  125 mg Oral Q12H  . Warfarin - Pharmacist Dosing Inpatient   Does not apply q1800    Assessment: 73yo female with AFib.  INR up at 3.27, but appears to have now peaked with held doses.  No bleeding noted.  No CBC today.   Goal of Therapy:  INR 2-3 Monitor platelets by anticoagulation protocol: Yes   Plan:  1-  No Coumadin today 2-  F/U in AM  Gracy Bruins, PharmD Clinical Pharmacist [facility]

## 2013-12-05 NOTE — Clinical Social Work Note (Signed)
CSW continues to follow this patient for d/c to [facility]. CSW spoke with Erling Conte of [facility]. Per [facility] has a room ready for patient pending outcome of family meeting with MD to discuss palliative goals of care. CSW to follow up.   [place], [place] Weekend Clinical Social Worker [phone]

## 2013-12-06 DIAGNOSIS — Z515 Encounter for palliative care: Secondary | ICD-10-CM

## 2013-12-06 LAB — PROTIME-INR
INR: 3.21 — ABNORMAL HIGH (ref 0.00–1.49)
Prothrombin Time: 32.8 seconds — ABNORMAL HIGH (ref 11.6–15.2)

## 2013-12-06 MED ORDER — FUROSEMIDE 40 MG PO TABS
60.0000 mg | ORAL_TABLET | Freq: Every day | ORAL | Status: DC
  Administered 2013-12-07 – 2013-12-08 (×2): 60 mg via ORAL
  Filled 2013-12-06 (×3): qty 1

## 2013-12-06 MED ORDER — POTASSIUM CHLORIDE CRYS ER 20 MEQ PO TBCR
40.0000 meq | EXTENDED_RELEASE_TABLET | Freq: Once | ORAL | Status: AC
  Administered 2013-12-06: 40 meq via ORAL
  Filled 2013-12-06: qty 2

## 2013-12-06 MED ORDER — AMOXICILLIN 500 MG PO CAPS
500.0000 mg | ORAL_CAPSULE | Freq: Two times a day (BID) | ORAL | Status: DC
  Administered 2013-12-06 – 2013-12-08 (×5): 500 mg via ORAL
  Filled 2013-12-06 (×7): qty 1

## 2013-12-06 MED ORDER — GABAPENTIN 300 MG PO CAPS
300.0000 mg | ORAL_CAPSULE | Freq: Every day | ORAL | Status: DC
  Administered 2013-12-06 – 2013-12-08 (×2): 300 mg via ORAL
  Filled 2013-12-06 (×4): qty 1

## 2013-12-06 NOTE — Consult Note (Signed)
Palliative Medicine Team at [facility]  Date: 12/06/2013   Patient Name: [name]  DOB: March 03, 1941  MRN: [id number]  Age / Sex: 73 y.o., female   PCP: Gayland Curry, DO Referring Physician: Kelvin Cellar, MD  Active Problems: Principal Problem:   Atrial fibrillation with RVR Active Problems:   MIXED CONNECTIVE TISSUE DISEASE   Prosthetic valve endocarditis   CKD (chronic kidney disease) stage 3, GFR 30-59 ml/min   Enteritis due to Clostridium difficile   CHF (congestive heart failure)   Nausea & vomiting   HPI/Reason for Consultation: [name] is a 73 y.o. female with chronic endocarditis on suppressive antibiotic therapy with multiple hospitalizations for recurrent sepsis, 4 admissions in last 6 months, admitted with confusion, weakness and falls. She has been at [facility] for rehab. PMT consulted for goals of care.   Participants in Discussion: HCPOA: YES, [name]-her daughter   Advance Directive: Yes -on chart   Code Status Orders        Start     Ordered   12/04/13 1551  Do not attempt resuscitation (DNR)   Continuous    Question Answer Comment  In the event of cardiac or respiratory ARREST Do not call a "code blue"   In the event of cardiac or respiratory ARREST Do not perform Intubation, CPR, defibrillation or ACLS   In the event of cardiac or respiratory ARREST Use medication by any route, position, wound care, and other measures to relive pain and suffering. May use oxygen, suction and manual treatment of airway obstruction as needed for comfort.      12/04/13 1550      I have reviewed the medical record, interviewed the patient and family, and examined the patient. The following aspects are pertinent.  Past Medical History  Diagnosis Date  . Coronary artery disease   . Atrial fibrillation     Now NSR  . GERD (gastroesophageal reflux disease)   . Hyperlipidemia   . 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 Anemia   .  Angina   . Hypertension   . Blood transfusion     "related to bad case of bronchitis once; 1 yr ago given due to GIB" (12/17/2012)  . H/O hiatal hernia   . Lupus     "that's compromised her lungs somewhat" (12/17/2012)  . Insomnia   . Endocarditis   . Prosthetic valve endocarditis   . Enterococcal bacteremia   . Heart murmur   . CHF (congestive heart failure)   . Pneumonia     "once or twice" (12/17/2012)  . Chronic bronchitis     "used to get it once/yr; hasn't had it since ~ 1986" (12/17/2012)  . Shortness of breath     "all the times sometimes" (12/17/2012)  . On home oxygen therapy     "2L prn; always at night" (12/17/2012)  . Lower GI bleed ~ 01/2012  . Arthritis     "hands" (12/17/2012)  . 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 Right bundle branch block 11/07/2013   History   Social History  . Marital Status: Divorced    Spouse Name: N/A    Number of Children: N/A  . Years of Education: N/A   Social History Main Topics  . Smoking status: Never Smoker   . Smokeless tobacco: Never Used  . Alcohol Use: No  . Drug Use: No  . Sexual Activity: No  Other Topics Concern  . None   Social History Narrative  . None   Family History  Problem Relation Age of Onset  . Heart disease Mother     CHF  . Mental illness Father     suicide  . Cancer Sister     pancreatic cancer  . COPD Brother    Scheduled Meds: . amiodarone  200 mg Oral Daily  . feeding supplement (ENSURE COMPLETE)  237 mL Oral Q24H  . feeding supplement (ENSURE)  1 Container Oral BID PC  . furosemide  20 mg Intravenous Daily  . piperacillin-tazobactam (ZOSYN)  IV  3.375 g Intravenous 3 times per day  . sodium chloride  10-40 mL Intracatheter Q12H  . sodium chloride  3 mL Intravenous Q12H  . vancomycin  125 mg Oral Q12H  . Warfarin - Pharmacist Dosing Inpatient   Does not apply q1800   Continuous Infusions:  PRN Meds:.acetaminophen, acetaminophen, alum & mag  hydroxide-simeth, ondansetron (ZOFRAN) IV, ondansetron, sodium chloride Allergies  Allergen Reactions  . Ceftriaxone     RASH BUT TOLERATED AMPICILLIN AND IMIPENEM WITHOUT PROBLEMS  . Codeine Nausea Only   CBC:    Component Value Date/Time   WBC 4.1 12/04/2013 1400   WBC 4.8 09/10/2013 1455   HGB 11.8* 12/04/2013 1400   HCT 36.3 12/04/2013 1400   PLT 117* 12/04/2013 1400   MCV 96.8 12/04/2013 1400   NEUTROABS 3.1 11/28/2013 0550   NEUTROABS 3.0 09/10/2013 1455   LYMPHSABS 1.1 11/28/2013 0550   LYMPHSABS 1.2 09/10/2013 1455   MONOABS 0.7 11/28/2013 0550   EOSABS 0.0 11/28/2013 0550   EOSABS 0.1 09/10/2013 1455   BASOSABS 0.0 11/28/2013 0550   BASOSABS 0.0 09/10/2013 1455   Comprehensive Metabolic Panel:    Component Value Date/Time   NA 139 12/04/2013 1400   NA 139 09/28/2013 1551   K 3.7 12/04/2013 1400   CL 104 12/04/2013 1400   CO2 21 12/04/2013 1400   BUN 17 12/04/2013 1400   BUN 25 09/28/2013 1551   CREATININE 1.56* 12/04/2013 1400   CREATININE 0.98 05/25/2013 1558   GLUCOSE 82 12/04/2013 1400   GLUCOSE 91 09/28/2013 1551   CALCIUM 8.6 12/04/2013 1400   AST 26 11/28/2013 0550   ALT 15 11/28/2013 0550   ALKPHOS 184* 11/28/2013 0550   BILITOT 1.5* 11/28/2013 0550   PROT 6.1 11/28/2013 0550   PROT 6.1 09/10/2013 1455   ALBUMIN 2.5* 11/28/2013 0550    Vital Signs: BP 118/72  Pulse 124  Temp(Src) 98.3 F (36.8 C) (Oral)  Resp 18  Ht 5\' 7"  (1.702 m)  Wt 77 kg (169 lb 12.1 oz)  BMI 26.58 kg/m2  SpO2 96% Filed Weights   12/04/13 0530 12/05/13 0525 12/06/13 0550  Weight: 77.885 kg (171 lb 11.3 oz) 77.202 kg (170 lb 3.2 oz) 77 kg (169 lb 12.1 oz)   09/12 0701 - 09/13 0700 In: 120 [P.O.:120] Out: -   Physical Exam:  Frail, alert and oriented, dyspneic at rest Introdicued herself and her family- made jokes and used humor about her situation.  Summary of Established Goals of Care and Medical Treatment Preferences  Mrs. Ruda is clearly much improved since yesterday. RN and family report mornings are  best for her but that this is better than she has been since even prior to admission. They feel that it is the IV antibiotics and that she has recurrent sepsis from her endocarditis and once this is treated she may improve. She has eaten  all of her breakfast this AM, reports a good appetite. I am concerned because she appears quite dyspneic at rest- but family report this is her baseline. CXR shows pulmonary edema. At this point Ms. Bark is able to tell me that she is "not ready to stop"- she feels that she can get her strength back-she would not want to live a life debilitated- her goal is to be able to take care of herself and drive again. She wants to get well enough in rehab that she can return to her daughter's home- she has to be able to get from bed to [facility] or bathroom independently for that to happen. I discussed hospice at home if rehab does not work out and also [facility].  Our discussion was based on patient's desire to continue treatment- I added a disclaimer that Ms. Ventresca is very fragile- her condition could deteriorate at any time and that is is almost impossible to predict her disease trajectory.  Primary Diagnoses  1. Chronic Endocarditis 2. Bioprosthetic Valve-vegetations 4. A. Fib with RVR  Active Symptoms: 1. Dyspnea 2. Delirium  Psycho-social/Spiritual:  Daughter [name] and her wife [name] at bedside- very supportive.  Prognosis:  Very difficult to determine currently- suspect <6 months   Palliative Performance Scale: 30%  Recommendations:  1. Code Status: DNR 2. Scope of Treatment:    Continue Full Scope Medical Treatment  Long term antibiotics  Continue Coumadin  Also focus on QOL and comfort along with standard treatment 3. Symptom Management:  She denies pain  She needs continued aggressive diuresis as much as her pressure can tolerated- she has pulmonary edema on CXR and clinically appears to be symptomatic from heart failure. 4. Palliative Prophylaxis:    bowel regimen ordered 5. Disposition:   Rehab with Palliative to follow as long as she continues to be alert and on the upswing. We made need another day or two to see where she is going to stabilize and land. I discussed hospice care in detail- based on my assesment this morning-this one snapshot in time and based on family and patient goals I do not think she is ready for a hospice facility-I will re-evaluated in AM.  Time: 8:00- 9:30AM Time Total: 90 minutes Greater than 50%  of this time was spent counseling and coordinating care related to the above assessment and plan.  Signed by: Roma Schanz, DO  12/06/2013, 9:39 AM  Please contact Palliative Medicine Team phone at [phone] for questions and concerns.

## 2013-12-06 NOTE — Progress Notes (Signed)
[facility] PROGRESS NOTE  NAYLEAH GAMEL FXT:[id number] DOB: 1940/08/20 DOA: 11/27/2013 PCP: Hollace Kinnier, DO  Assessment/Plan: 1. Atrial fibrillation rapid ventricular response -Heart rates remained in the 90s to 100's -She is on metoprolol 12.5 mg by mouth twice a day and amiodarone 200 mg by mouth daily -Anticoagulated with warfarin, pharmacy consultation for warfarin management. -Last transthoracic echocardiogram performed on 08/16/2013 which showed an ejection fraction of 60-65% without wall motion abnormalities.   2.  History of prostatic valve endocarditis. -Dr. Johnnye Sima of infectious disease consulted, she is currently on IV Zosyn -Plan to discharge her on amoxicillin per infectious disease  3.  C Diff Colitis -Patient seen and evaluated by infectious disease, plan on 6 weeks of by mouth vancomycin taper  4. Chronic diastolic congestive heart failure -Last transthoracic echocardiogram performed on 11/16/2013 showing ejection fraction of 60-65%   6. Goals of Care  -I held family meeting this afternoon to discuss goals of care. Despite maximizing medical therapy she continues to decline, becoming more lethargic and having minimal PO intake today. She has had recurrent hospitalizations over the past month that has been associated with an overall progressive decline. Family stating that she would not want to undergo cardiopulmonary resuscitation, particularly in the current condition of advanced heart disease and the unlikelihood of obtaining a hopefull recovery. Family would like to focus care on comfort and interested in a Palliative Care consultation. I spoke to her Cardiologist Dr Terrence Dupont who agreed with Palliative Care Consultation. Likely be discharged to [facility] inpatient hospice -DNR order placed in chart.   -On 12/06/2013 patient showing clinical improvement. She appeared to be more alert compared to the past couple of days. Dr. Hilma Favors of [place] evaluated  her today, did not feel that she was ready for Inpatient Hospice. Options discussed with patient and family and felt that a reasonable approach would be to go to SNF for rehab having Palliative Care follow. If there was a change in her status, she could transition to hospice from SNF. Medications discussed with family at bedside.   Code Status: DNR Family Communication: Family meeting held Disposition Plan: Plan to changed to attempt rehab at [facility]. Family requested a different facility from the one she came from.     Consultants:  Infectious disease  Palliative Care  Antibiotics:  Vancomycin by mouth  IV Zosyn  HPI/Subjective: Patient is a pleasant 73-year-old female with a history of recurrent endocarditis, chronic atrial fibrillation, blood pressure like aortic valve was admitted to the medicine service on 11/28/2013, patient presenting with nausea and vomiting. She says be in A. fib with RVR, which likely precipitated acute decompensated congestive heart failure. Infectious disease was consulted given history of endocarditis.   Patient appears to be more awake and alert following some commands, although remains profoundly weak.     Objective: Filed Vitals:   12/06/13 1543  BP: 135/79  Pulse: 105  Temp: 97.3 F (36.3 C)  Resp: 16    Intake/Output Summary (Last 24 hours) at 12/06/13 1710 Last data filed at 12/06/13 1647  Gross per 24 hour  Intake   1493 ml  Output    500 ml  Net    993 ml   Filed Weights   12/04/13 0530 12/05/13 0525 12/06/13 0550  Weight: 77.885 kg (171 lb 11.3 oz) 77.202 kg (170 lb 3.2 oz) 77 kg (169 lb 12.1 oz)    Exam:   General:  Patient more awake and alert,  is able to follow a  few simple commands  Cardiovascular: Regular rate and rhythm normal S1-S2  Respiratory: Clear to auscultation bilaterally  Abdomen: Soft nontender nondistended  Musculoskeletal: No edema  Data Reviewed: Basic Metabolic Panel:  Recent Labs Lab  11/30/13 0608 12/01/13 0330 12/03/13 0600 12/04/13 1400  NA 138 138 136* 139  K 4.6 4.7 5.0 3.7  CL 105 103 103 104  CO2 22 23 19 21   GLUCOSE 90 80 71 82  BUN 20 18 17 17   CREATININE 1.59* 1.52* 1.58* 1.56*  CALCIUM 8.5 8.5 8.7 8.6   Liver Function Tests: No results found for this basename: AST, ALT, ALKPHOS, BILITOT, PROT, ALBUMIN,  in the last 168 hours No results found for this basename: LIPASE, AMYLASE,  in the last 168 hours No results found for this basename: AMMONIA,  in the last 168 hours CBC:  Recent Labs Lab 12/01/13 0330 12/02/13 0645 12/03/13 0600 12/04/13 1400  WBC 2.9* 4.2 3.4* 4.1  HGB 11.3* 11.1* 11.0* 11.8*  HCT 36.0 34.7* 35.0* 36.3  MCV 98.4 99.7 99.4 96.8  PLT 101* 123* 111* 117*   Cardiac Enzymes: No results found for this basename: CKTOTAL, CKMB, CKMBINDEX, TROPONINI,  in the last 168 hours BNP (last 3 results)  Recent Labs  11/14/13 0923 11/27/13 2111 12/04/13 1400  PROBNP 13807.0* 7774.0* 6091.0*   CBG: No results found for this basename: GLUCAP,  in the last 168 hours  Recent Results (from the past 240 hour(s))  CULTURE, BLOOD (ROUTINE X 2)     Status: None   Collection Time    11/27/13  9:04 PM      Result Value Ref Range Status   Specimen Description BLOOD LEFT ARM   Final   Special Requests     Final   Value: BOTTLES DRAWN AEROBIC AND ANAEROBIC 8CC BLUE,6CC RED   Culture  Setup Time     Final   Value: 11/28/2013 10:18     Performed at [facility]   Culture     Final   Value: NO GROWTH 5 DAYS     Note: Culture results may be compromised due to an excessive volume of blood received in culture bottles.     Performed at [facility]   Report Status 12/04/2013 FINAL   Final  CULTURE, BLOOD (ROUTINE X 2)     Status: None   Collection Time    11/27/13 10:02 PM      Result Value Ref Range Status   Specimen Description BLOOD LEFT HAND   Final   Special Requests BOTTLES DRAWN AEROBIC ONLY 5CC   Final   Culture   Setup Time     Final   Value: 11/28/2013 10:17     Performed at [facility]   Culture     Final   Value: NO GROWTH 5 DAYS     Performed at [facility]   Report Status 12/04/2013 FINAL   Final  MRSA PCR SCREENING     Status: None   Collection Time    11/28/13  2:46 AM      Result Value Ref Range Status   MRSA by PCR NEGATIVE  NEGATIVE Final   Comment:            The GeneXpert MRSA Assay (FDA     approved for NASAL specimens     only), is one component of a     comprehensive MRSA colonization     surveillance program. It is not  intended to diagnose MRSA     infection nor to guide or     monitor treatment for     MRSA infections.  CLOSTRIDIUM DIFFICILE BY PCR     Status: None   Collection Time    11/30/13  3:07 PM      Result Value Ref Range Status   C difficile by pcr NEGATIVE  NEGATIVE Final     Studies: No results found.  Scheduled Meds: . amiodarone  200 mg Oral Daily  . amoxicillin  500 mg Oral Q12H  . feeding supplement (ENSURE COMPLETE)  237 mL Oral Q24H  . feeding supplement (ENSURE)  1 Container Oral BID PC  . furosemide  60 mg Oral Daily  . sodium chloride  10-40 mL Intracatheter Q12H  . sodium chloride  3 mL Intravenous Q12H  . vancomycin  125 mg Oral Q12H  . Warfarin - Pharmacist Dosing Inpatient   Does not apply q1800   Continuous Infusions:   Principal Problem:   Atrial fibrillation with RVR Active Problems:   Prosthetic valve endocarditis   MIXED CONNECTIVE TISSUE DISEASE   CKD (chronic kidney disease) stage 3, GFR 30-59 ml/min   Enteritis due to Clostridium difficile   CHF (congestive heart failure)   Nausea & vomiting    Time spent: 25 minutes    Kelvin Cellar  [facility] Pager [phone]. If 7PM-7AM, please contact night-coverage at [email or web address], password [facility] 12/06/2013, 5:10 PM  LOS: 9 days

## 2013-12-06 NOTE — Clinical Social Work Note (Signed)
CSW continues to follow this patient for d/c planning needs.CSW spoke with patient's RN Raquel Sarna) and made aware of patient's anticipated discharge for Monday, 9/14.  CSW reviewed chart and spoke with Harmon Pier of [employer]. CSW made Harmon Pier aware of palliative goals of care as indicated by MD. CSW also made Harmon Pier aware family continues to be agreeable to [facility] placement. Harmon Pier to follow up with patient's family. CSW to follow tomorrow.  [place], [place] Weekend Clinical Social Worker [phone]

## 2013-12-06 NOTE — Progress Notes (Signed)
  ANTICOAGULATION CONSULT NOTE - Follow Up Consult  Pharmacy Consult for Coumadin Indication: atrial fibrillation  Allergies  Allergen Reactions  . Ceftriaxone     RASH BUT TOLERATED AMPICILLIN AND IMIPENEM WITHOUT PROBLEMS  . Codeine Nausea Only    Patient Measurements: Height: 5\' 7"  (170.2 cm) Weight: 169 lb 12.1 oz (77 kg) IBW/kg (Calculated) : 61.6 Heparin Dosing Weight:   Vital Signs: Temp: 98.3 F (36.8 C) (09/13 0550) Temp src: Oral (09/13 0550) BP: 118/72 mmHg (09/13 0550) Pulse Rate: 124 (09/13 0550)  Labs:  Recent Labs  12/04/13 0556 12/04/13 1400 12/05/13 0407 12/06/13 0400  HGB  --  11.8*  --   --   HCT  --  36.3  --   --   PLT  --  117*  --   --   LABPROT 34.2*  --  33.3* 32.8*  INR 3.38*  --  3.27* 3.21*  CREATININE  --  1.56*  --   --     Estimated Creatinine Clearance: 33.4 ml/min (by C-G formula based on Cr of 1.56).   Medications:  Scheduled:  . amiodarone  200 mg Oral Daily  . amoxicillin  500 mg Oral Q12H  . feeding supplement (ENSURE COMPLETE)  237 mL Oral Q24H  . feeding supplement (ENSURE)  1 Container Oral BID PC  . furosemide  60 mg Oral Daily  . potassium chloride  40 mEq Oral Once  . sodium chloride  10-40 mL Intracatheter Q12H  . sodium chloride  3 mL Intravenous Q12H  . vancomycin  125 mg Oral Q12H  . Warfarin - Pharmacist Dosing Inpatient   Does not apply q1800    Assessment: 73yo female with AFib on Coumadin 4mg  daily pta.  Coumadin has been held for elevated INR, down to 3.21 this AM which is a minimal change though reflecting a decreasing trend.  No bleeding noted.  No other labs today.  Goal of Therapy:  INR 2-3 Monitor platelets by anticoagulation protocol: Yes   Plan:  1-  No Coumadin today 2-  F/U in AM  Gracy Bruins, PharmD Clinical Pharmacist [facility]

## 2013-12-07 ENCOUNTER — Ambulatory Visit: Payer: Medicare Other | Admitting: Infectious Disease

## 2013-12-07 DIAGNOSIS — G9340 Encephalopathy, unspecified: Secondary | ICD-10-CM

## 2013-12-07 DIAGNOSIS — J9610 Chronic respiratory failure, unspecified whether with hypoxia or hypercapnia: Secondary | ICD-10-CM

## 2013-12-07 DIAGNOSIS — R0902 Hypoxemia: Secondary | ICD-10-CM

## 2013-12-07 LAB — BASIC METABOLIC PANEL
Anion gap: 11 (ref 5–15)
BUN: 8 mg/dL (ref 6–23)
CHLORIDE: 102 meq/L (ref 96–112)
CO2: 26 meq/L (ref 19–32)
Calcium: 8.3 mg/dL — ABNORMAL LOW (ref 8.4–10.5)
Creatinine, Ser: 0.96 mg/dL (ref 0.50–1.10)
GFR calc Af Amer: 65 mL/min — ABNORMAL LOW (ref >90)
GFR calc non Af Amer: 56 mL/min — ABNORMAL LOW (ref >90)
Glucose, Bld: 77 mg/dL (ref 70–99)
Potassium: 3 mEq/L — ABNORMAL LOW (ref 3.7–5.3)
Sodium: 139 mEq/L (ref 137–147)

## 2013-12-07 LAB — PROTIME-INR
INR: 3 — ABNORMAL HIGH (ref 0.00–1.49)
Prothrombin Time: 31.1 seconds — ABNORMAL HIGH (ref 11.6–15.2)

## 2013-12-07 MED ORDER — VANCOMYCIN 50 MG/ML ORAL SOLUTION: ORAL | Status: AC

## 2013-12-07 MED ORDER — VANCOMYCIN 50 MG/ML ORAL SOLUTION: 250.0000 mg | Freq: Four times a day (QID) | ORAL | Status: DC

## 2013-12-07 MED ORDER — WARFARIN SODIUM 2 MG PO TABS: ORAL_TABLET | ORAL | Status: DC

## 2013-12-07 MED ORDER — WARFARIN 0.5 MG HALF TABLET
0.5000 mg | ORAL_TABLET | Freq: Once | ORAL | Status: DC
  Filled 2013-12-07: qty 1

## 2013-12-07 MED ORDER — WARFARIN SODIUM 1 MG PO TABS
1.0000 mg | ORAL_TABLET | Freq: Once | ORAL | Status: AC
  Administered 2013-12-07: 1 mg via ORAL
  Filled 2013-12-07 (×2): qty 1

## 2013-12-07 MED ORDER — METOPROLOL TARTRATE 12.5 MG HALF TABLET
12.5000 mg | ORAL_TABLET | Freq: Two times a day (BID) | ORAL | Status: DC
  Administered 2013-12-07 – 2013-12-08 (×3): 12.5 mg via ORAL
  Filled 2013-12-07 (×5): qty 1

## 2013-12-07 MED ORDER — ENSURE PUDDING PO PUDG: 1.0000 | Freq: Two times a day (BID) | ORAL | Status: DC

## 2013-12-07 MED ORDER — METOPROLOL TARTRATE 12.5 MG HALF TABLET: 12.5000 mg | ORAL_TABLET | Freq: Two times a day (BID) | ORAL | Status: AC

## 2013-12-07 MED ORDER — POTASSIUM CHLORIDE CRYS ER 20 MEQ PO TBCR
40.0000 meq | EXTENDED_RELEASE_TABLET | Freq: Four times a day (QID) | ORAL | Status: AC
  Administered 2013-12-07 (×2): 40 meq via ORAL
  Filled 2013-12-07 (×2): qty 2

## 2013-12-07 MED ORDER — FUROSEMIDE 20 MG PO TABS
60.0000 mg | ORAL_TABLET | Freq: Every day | ORAL | Status: DC
Start: 2013-12-07 — End: 2013-12-17

## 2013-12-07 MED ORDER — ACETAMINOPHEN 325 MG PO TABS: 650.0000 mg | ORAL_TABLET | Freq: Four times a day (QID) | ORAL | Status: AC | PRN

## 2013-12-07 NOTE — Progress Notes (Signed)
Physical Therapy Treatment Patient Details Name: [name] MRN: [id number] DOB: April 16, 1940 Today's Date: 12/07/2013    History of Present Illness      PT Comments    Pt did better with rolling today, but continues to need +2 assistance.  Follow Up Recommendations  SNF;Supervision/Assistance - 24 hour     Equipment Recommendations  None recommended by PT    Recommendations for Other Services       Precautions / Restrictions Precautions Precautions: Fall Precaution Comments: monitor sats Restrictions Weight Bearing Restrictions: No    Mobility  Bed Mobility Overal bed mobility: Needs Assistance;+2 for physical assistance Bed Mobility: Rolling;Sidelying to Sit;Sit to Supine Rolling: Mod assist Sidelying to sit: +2 for physical assistance;Mod assist   Sit to supine: Max assist;+2 for physical assistance   General bed mobility comments: use of bed pad to get hips to EOB.  Transfers                    Ambulation/Gait                 Stairs            Wheelchair Mobility    Modified Rankin (Stroke Patients Only)       Balance Overall balance assessment: Needs assistance Sitting-balance support: Feet supported;Bilateral upper extremity supported Sitting balance-Leahy Scale: Poor Sitting balance - Comments: leaning R and posteriorly initially.  Performed L side leaning onto L elbow supported by pillow. After several minutes she began leaning too far L. Sat up and then able to maintain midline with slight posterior lean.  O2 99% HR 118.  Pt c/o dizziness that was getting worse after another 1.5 minutes and returned supine.                            Cognition Arousal/Alertness: Awake/alert Behavior During Therapy: Flat affect Overall Cognitive Status: Within Functional Limits for tasks assessed Area of Impairment: Safety/judgement;Problem solving       Following Commands: Follows one step commands with increased time      Problem Solving: Slow processing General Comments:  (Pt with flat affect and not speaking much during therapy)    Exercises      General Comments General comments (skin integrity, edema, etc.): Family educated on raising HOB throughout the day to mimic sitting in chair to increase upright tolerance.  Heel protectors donned and heels floated.      Pertinent Vitals/Pain      Home Living                      Prior Function            PT Goals (current goals can now be found in the care plan section) Progress towards PT goals: Progressing toward goals    Frequency  Min 2X/week    PT Plan Current plan remains appropriate    Co-evaluation             End of Session Equipment Utilized During Treatment: Oxygen Activity Tolerance: Patient limited by fatigue Patient left: in bed;with family/visitor present;with call bell/phone within reach     Time: 1400-1418 PT Time Calculation (min): 18 min  Charges:  $Therapeutic Activity: 8-22 mins                    G Codes:      Marlos Carmen LUBECK 12/07/2013, 2:28 PM

## 2013-12-07 NOTE — Progress Notes (Addendum)
  ANTICOAGULATION CONSULT NOTE - Follow Up Consult  Pharmacy Consult for Coumadin Indication: atrial fibrillation  Allergies  Allergen Reactions  . Ceftriaxone     RASH BUT TOLERATED AMPICILLIN AND IMIPENEM WITHOUT PROBLEMS  . Codeine Nausea Only   Labs:  Recent Labs  12/04/13 1400 12/05/13 0407 12/06/13 0400 12/07/13 0341  HGB 11.8*  --   --   --   HCT 36.3  --   --   --   PLT 117*  --   --   --   LABPROT  --  33.3* 32.8* 31.1*  INR  --  3.27* 3.21* 3.00*  CREATININE 1.56*  --   --  0.96    Estimated Creatinine Clearance: 54.1 ml/min (by C-G formula based on Cr of 0.96).   Assessment: 73yo female with AFib on Coumadin 4mg  daily pta.  Coumadin has been held for elevated INR, down to 3 this AM  reflecting a decreasing trend.  No bleeding noted.   Goal of Therapy:  INR 2-3 Monitor platelets by anticoagulation protocol: Yes   Plan:  1-  Coumadin 1 mg po x 1 today 2-  F/U in AM  Thank you. Anette Guarneri, PharmD [phone]

## 2013-12-07 NOTE — Discharge Summary (Addendum)
Physician Discharge Summary  [name] QQV:[id number] DOB: 04/02/1940 DOA: 11/27/2013  PCP: Hollace Kinnier, DO  Admit date: 11/27/2013 Discharge date: 12/08/2013  Time spent: 35 minutes  Recommendations for Outpatient Follow-up:  1. Please administer Oral Vancomycin as follows: 125 mg PO BID for 7 days, followed by 125mg  daily for 7 days followed by 125 mg every other day for 7 days 2. Will keep her on Amoxicillin 500 mg BID for history of endocarditis.  3. Repeat BMP in 2-3 days  Discharge Diagnoses:  Principal Problem:   Atrial fibrillation with RVR Active Problems:   Prosthetic valve endocarditis   MIXED CONNECTIVE TISSUE DISEASE   CKD (chronic kidney disease) stage 3, GFR 30-59 ml/min   Enteritis due to Clostridium difficile   CHF (congestive heart failure)   Nausea & vomiting   Discharge Condition: Stable/Improved  Diet recommendation: Heart Healthy  Filed Weights   12/05/13 0525 12/06/13 0550 12/07/13 0607  Weight: 77.202 kg (170 lb 3.2 oz) 77 kg (169 lb 12.1 oz) 76.841 kg (169 lb 6.5 oz)    History of present illness:  [name] is a 73 y.o. female with history of recurrent endocarditis, chronic atrial fibrillation, bioprosthetic aortic valve, CHF, recently admitted for CVA, recently treated for C. difficile colitis and is on oral vancomycin, CAD status post CABG, lupus was brought to the ER because of nausea and vomiting. As per patient's daughter patient had at least 4-5 episodes of nausea vomiting last evening at the nursing home. Patient did not have any abdominal pain or diarrhea. In the ER patient was found to be mildly short of breath and A. fib with RVR. CT angiogram of the chest was done which was negative for any acute PE but did show some chronic nonocclusive pulmonary embolism and bilateral pleural effusion. Patient on exam is mildly short of breath. Denies any chest pain. Denies any corrective cough fever chills. In the ER patient was started on empiric  antibiotics and also was given Lasix 40 mg IV one dose by the ER patient. Patient will be admitted for further management of A. fib with RVR and nausea vomiting. Patient did not have any further episodes of nausea vomiting in the ER.    Hospital Course:  Patient is a pleasant 50-year-old female with a history of recurrent endocarditis, chronic atrial fibrillation, blood pressure like aortic valve was admitted to the medicine service on 11/28/2013, patient presenting with nausea and vomiting. She says be in A. fib with RVR, which likely precipitated acute decompensated congestive heart failure. Infectious disease was consulted given history of endocarditis 1. Atrial fibrillation rapid ventricular response -Suspect Afib with RVR precipitating acute CHF Heart rates remained in the 90s to 100's  -She is on metoprolol 12.5 mg by mouth twice a day and amiodarone 200 mg by mouth daily  -Anticoagulated with warfarin, pharmacy consultation for warfarin management.  -Last transthoracic echocardiogram performed on 08/16/2013 which showed an ejection fraction of 60-65% without wall motion abnormalities.  2. Acute on chronic diastolic congestive heart failure -Last transthoracic echocardiogram performed on 11/16/2013 showing ejection fraction of 60-65%, moderate to severe tricuspid regurgitation, atrium was severely dilated, severe pulmonary hypertension -Likely precipitated by A. fib with RVR -Patient to be discharged on Lasix 60 mg by mouth daily 2. History of prostatic valve endocarditis.  -Dr. Johnnye Sima of infectious disease consulted, received IV Zosyn during this hospitalization  -Infectious disease recommending discharging her on amoxicillin 500 mg by mouth twice a day for prophylaxis  3. C  Diff Colitis  -Infectious disease recommending Oral Vancomycin as follows: 125 mg PO BID for 7 days, followed by 125mg  daily for 7 days followed by 125 mg every other day for 7 days 4. Chronic diastolic congestive  heart failure  -Last transthoracic echocardiogram performed on 11/16/2013 showing ejection fraction of 60-65%   Goals of Care  -I held family meeting on 12/04/2013  to discuss goals of care. Despite maximizing medical therapy she continues to decline, becoming more lethargic and having minimal PO intake today. She has had recurrent hospitalizations over the past month that has been associated with an overall progressive decline. Family stating that she would not want to undergo cardiopulmonary resuscitation, particularly in the current condition of advanced heart disease and the unlikelihood of obtaining a hopefull recovery. Family would like to focus care on comfort and interested in a Palliative Care consultation. I spoke to her Cardiologist Dr Terrence Dupont who agreed with Palliative Care Consultation.  -DNR order placed in chart.  -With her steep decline family agreed with inpatient hospice -On 12/06/2013 patient showing clinical improvement. She appeared to be more alert compared to the past couple of days. Dr. Hilma Favors of [facility] evaluated her, did not feel that she was ready for Inpatient Hospice. Options discussed with patient and family and felt that a reasonable approach would be to go to SNF for rehab having Palliative Care follow. If there was a change in her status, she could transition to hospice from SNF. Medications discussed with family at bedside.    Consultations:  Palliative care  Discharge Exam: Filed Vitals:   12/07/13 0933  BP: 101/56  Pulse: 115  Temp: 98.8 F (37.1 C)  Resp: 18    General: Patient is much improved, awake and alert oriented, following commands Cardiovascular: Irregular rate and rhythm normal S1-S2, tachycardic Respiratory: Normal respiratory effort, has a few bibasilar crackles Abdomen: Soft nontender nondistended Extremities: Trace edema to lower extremities bilaterally  Discharge Instructions You were cared for by a hospitalist during your  hospital stay. If you have any questions about your discharge medications or the care you received while you were in the hospital after you are discharged, you can call the unit and asked to speak with the hospitalist on call if the hospitalist that took care of you is not available. Once you are discharged, your primary care physician will handle any further medical issues. Please note that NO REFILLS for any discharge medications will be authorized once you are discharged, as it is imperative that you return to your primary care physician (or establish a relationship with a primary care physician if you do not have one) for your aftercare needs so that they can reassess your need for medications and monitor your lab values.  Discharge Instructions   Call MD for:  difficulty breathing, headache or visual disturbances    Complete by:  As directed      Call MD for:  extreme fatigue    Complete by:  As directed      Call MD for:  hives    Complete by:  As directed      Call MD for:  persistant dizziness or light-headedness    Complete by:  As directed      Call MD for:  persistant nausea and vomiting    Complete by:  As directed      Call MD for:  redness, tenderness, or signs of infection (pain, swelling, redness, odor or green/yellow discharge around incision site)  Complete by:  As directed      Call MD for:  severe uncontrolled pain    Complete by:  As directed      Call MD for:  temperature >100.4    Complete by:  As directed      Diet - low sodium heart healthy    Complete by:  As directed      Increase activity slowly    Complete by:  As directed           Current Discharge Medication List    START taking these medications   Details  acetaminophen (TYLENOL) 325 MG tablet Take 2 tablets (650 mg total) by mouth every 6 (six) hours as needed for mild pain (or Fever >/= 101). Qty: 30 tablet, Refills: 1    feeding supplement, ENSURE, (ENSURE) PUDG Take 1 Container by mouth 2 (two)  times daily after a meal. Qty: 20 Can, Refills: 0      CONTINUE these medications which have CHANGED   Details  furosemide (LASIX) 20 MG tablet Take 3 tablets (60 mg total) by mouth daily. Qty: 60 tablet, Refills: 1    metoprolol tartrate (LOPRESSOR) 12.5 mg TABS tablet Take 0.5 tablets (12.5 mg total) by mouth 2 (two) times daily. Qty: 60 tablet, Refills: 1    vancomycin (VANCOCIN) 50 mg/mL oral solution Take 125 mg PO BID for 7 days, followed by 125 mg daily for 7 days followed by 125 mg every other day for 7 days Qty: 50 mL, Refills: 0    warfarin (COUMADIN) 2 MG tablet Take 2 mg on Mon, Wed and Fri, starting on Wed. 4 mg PO on Tues, Thurs, Sat and Sunday. Qty: 30 tablet, Refills: 1      CONTINUE these medications which have NOT CHANGED   Details  amiodarone (PACERONE) 200 MG tablet Take 1 tablet (200 mg total) by mouth daily. Qty: 30 tablet, Refills: 1    amoxicillin (AMOXIL) 500 MG capsule Take 1 capsule (500 mg total) by mouth every 12 (twelve) hours.    famotidine (PEPCID) 20 MG tablet Take 1 tablet (20 mg total) by mouth 2 (two) times daily. Qty: 60 tablet, Refills: 3    feeding supplement, ENSURE COMPLETE, (ENSURE COMPLETE) LIQD Take 237 mLs by mouth 3 (three) times daily between meals. Qty: 20 Bottle, Refills: 1    Ferrous Fumarate (IRON) 18 MG TBCR Take 18 mg by mouth 2 (two) times daily.    gabapentin (NEURONTIN) 300 MG capsule Take 1 capsule (300 mg total) by mouth at bedtime. Qty: 30 capsule, Refills: 1    hydroxychloroquine (PLAQUENIL) 200 MG tablet Take 200 mg by mouth 2 (two) times daily.     potassium chloride (KLOR-CON M15) 15 MEQ tablet Take 2 tablets (30 mEq total) by mouth 2 (two) times daily. Qty: 60 tablet, Refills: 1    saccharomyces boulardii (FLORASTOR) 250 MG capsule Take 1 capsule (250 mg total) by mouth 2 (two) times daily. Qty: 60 capsule, Refills: 3   Associated Diagnoses: Loss of weight; Antibiotic-associated diarrhea      STOP taking  these medications     mirtazapine (REMERON) 15 MG tablet      rosuvastatin (CRESTOR) 10 MG tablet        Allergies  Allergen Reactions  . Ceftriaxone     RASH BUT TOLERATED AMPICILLIN AND IMIPENEM WITHOUT PROBLEMS  . Codeine Nausea Only   Follow-up Information   Follow up with REED, TIFFANY, DO In 2 weeks.   Specialty:  Geriatric Medicine   Contact information:   [address] [place] [address] [phone]        The results of significant diagnostics from this hospitalization (including imaging, microbiology, ancillary and laboratory) are listed below for reference.    Significant Diagnostic Studies: Dg Chest 2 View  11/15/2013   CLINICAL DATA:  Suspect pneumonia; history of CHF  EXAM: CHEST  2 VIEW  COMPARISON:  .  Portable chest x-ray of November 14, 2013  FINDINGS: The right lung is less well inflated today. The interstitial markings are increased bilaterally. The cardiopericardial silhouette is enlarged. The pulmonary vascularity is indistinct. There is dense calcification in the mitral valvular annulus. A trace of pleural fluid blunts the costophrenic angles bilaterally. The patient has undergone previous median sternotomy. The bony thorax is unremarkable on the frontal film. On the lateral film the thoracic spine cannot be adequately assessed.  IMPRESSION: Mildly increased interstitial markings bilaterally are slightly more conspicuous today. This may reflect worsening of CHF. No discrete alveolar pneumonia is demonstrated.   Electronically Signed   By: David  Martinique   On: 11/15/2013 13:54   Dg Abd 1 View  11/28/2013   CLINICAL DATA:  Nausea and vomiting.  EXAM: ABDOMEN - 1 VIEW  COMPARISON:  CT abdomen and pelvis 08/03/2013  FINDINGS: The bowel gas pattern is normal. No radio-opaque calculi or other significant radiographic abnormality are seen. Residual contrast material in the bladder.  IMPRESSION: Nonobstructive bowel gas pattern.   Electronically Signed   By: Lucienne Capers M.D.   On: 11/28/2013 05:06   Ct Head Wo Contrast  12/02/2013   CLINICAL DATA:  Altered mental status, confusion, weakness  EXAM: CT HEAD WITHOUT CONTRAST  TECHNIQUE: Contiguous axial images were obtained from the base of the skull through the vertex without intravenous contrast.  COMPARISON:  MRI brain dated 11/14/2013  FINDINGS: No evidence of parenchymal hemorrhage or extra-axial fluid collection. No mass lesion, mass effect, or midline shift.  No CT evidence of acute infarction.  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  Extensive small vessel ischemic changes. Intracranial atherosclerosis.  Global cortical atrophy.  No ventriculomegaly.  The visualized paranasal sinuses are essentially clear. The mastoid air cells are unopacified.  No evidence of calvarial fracture.  IMPRESSION: No evidence of acute intracranial abnormality.  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  Atrophy with extensive small vessel ischemic changes and intracranial atherosclerosis.   Electronically Signed   By: Julian Hy M.D.   On: 12/02/2013 20:26   Ct Head Wo Contrast  11/14/2013   CLINICAL DATA:  73 year old female code stroke. Unresponsive with mental status change. Initial encounter.  EXAM: CT HEAD WITHOUT CONTRAST  TECHNIQUE: Contiguous axial images were obtained from the base of the skull through the vertex without intravenous contrast.  COMPARISON:  Head and cervical spine CT 1039 hr the same day and earlier.  FINDINGS: Stable paranasal sinuses and mastoids. No acute osseous abnormality identified. Stable orbit and scalp soft tissues.  Calcified atherosclerosis at the skull base. 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 Left thalamic and right caudate lacunar infarcts also are stable. Patchy and confluent cerebral white matter hypodensity also has not significantly changed. Scattered small lacunar infarcts in both cerebellar hemispheres  also appear stable. No midline shift, mass effect, or evidence of intracranial mass lesion. No acute intracranial hemorrhage identified. No evidence of cortically based acute infarction identified. No suspicious intracranial vascular hyperdensity.  IMPRESSION:  1. Stable non contrast CT appearance of the brain. No new intracranial abnormality identified. 2. Chronic ischemic disease detailed above. Study discussed by telephone with Dr. Wallie Char on 11/14/2013 at 1718 hrs.   Electronically Signed   By: Lars Pinks M.D.   On: 11/14/2013 17:22   Ct Head Wo Contrast  11/14/2013   CLINICAL DATA:  Fall earlier today. Now altered mental status and lethargy. Patient on Coumadin.  EXAM: CT HEAD WITHOUT CONTRAST  CT CERVICAL SPINE WITHOUT CONTRAST  TECHNIQUE: Multidetector CT imaging of the head and cervical spine was performed following the standard protocol without intravenous contrast. Multiplanar CT image reconstructions of the cervical spine were also generated.  COMPARISON:  Prior CT scan of the head and cervical spine 06/27/2013  FINDINGS: CT HEAD FINDINGS  Negative for acute intracranial hemorrhage, acute infarction, mass, mass effect, hydrocephalus or midline shift. Gray-white differentiation is preserved throughout. A stable appearance of atrophy, prior right parietal infarct, prior right occipital infarct and extensive sequelae of longstanding chronic microvascular ischemic white matter disease. Stable ventricular configuration. No acute scalp hematoma or calvarial fracture. Normal aeration of the mastoid air cells. Mild mucoperiosteal thickening in the inferior aspect of the maxillary sinuses. Atherosclerosis in the bilateral cavernous carotid arteries.  CT CERVICAL SPINE FINDINGS  No acute fracture, malalignment or prevertebral soft tissue swelling. Multilevel degenerative disc disease. Disc space narrowing most prominent at C5-C6. Multilevel facet arthropathy on the right at C3-C4 and C4-C5. The is  Unremarkable CT appearance of the thyroid gland. No acute soft tissue abnormality. The lung apices are unremarkable.  IMPRESSION: CT HEAD  1. No acute intracranial abnormality. 2. Stable atrophy, remote infarcts involving the right parietal and right occipital regions and advanced chronic microvascular ischemic white matter disease. CT CSPINE  1. No acute fracture or malalignment. 2. Multilevel cervical spondylosis and right-sided facet arthropathy.   Electronically Signed   By: Jacqulynn Cadet M.D.   On: 11/14/2013 11:34   Ct Angio Chest Pe W/cm &/or Wo Cm  11/27/2013   CLINICAL DATA:  Respiratory distress.  Chest pain.  EXAM: CT ANGIOGRAPHY CHEST WITH CONTRAST  TECHNIQUE: Multidetector CT imaging of the chest was performed using the standard protocol during bolus administration of intravenous contrast. Multiplanar CT image reconstructions and MIPs were obtained to evaluate the vascular anatomy.  CONTRAST:  91mL OMNIPAQUE IOHEXOL 350 MG/ML SOLN  COMPARISON:  Chest radiograph November 27, 2013 and CT of the chest July 11, 2012.  FINDINGS: Adequate pulmonary arterial contrast opacification. Main pulmonary artery is enlarged, 3.9 cm. Nonocclusive segmental to subsegmental right lower lobe pulmonary eccentric arterial filling defect, appearing adherent and chronic though new from prior imaging (axial 136/306).  Heart size is moderate to severely enlarged, with dilated right ventricle. Reflux of contrast into the liver consistent with right heart failure. Aortic annular calcifications. No right heart strain. Coronary artery calcifications. No pericardial fluid collections. Thoracic aorta appears normal in course and caliber, mild calcific atherosclerosis. Limited assessment for lymphadenopathy due to bolus timing.  Patchy ground-glass opacities, small to moderate right and small left pleural effusion. Interlobular septal thickening in the lung bases. Tracheobronchial tree is patent and midline. No pneumothorax.  16  mm gallstone. Soft tissues are nonsuspicious. Status post median sternotomy. Mild thoracic spondylosis without destructive bony lesions.  Review of the MIP images confirms the above findings.  IMPRESSION: No convincing evidence of acute pulmonary embolism; nonocclusive right lower lobe segmental to subsegmental pulmonary embolus appears chronic. No right heart strain. Similar enlarged pulmonary arteries  suggesting sequelae of pulmonary arterial hypertension.  Cardiomegaly with evidence of right heart failure. Findings of acute and chronic pulmonary edema with small to moderate right and small left pleural effusions.   Electronically Signed   By: Elon Alas   On: 11/27/2013 23:54   Ct Cervical Spine Wo Contrast  11/14/2013   CLINICAL DATA:  Fall earlier today. Now altered mental status and lethargy. Patient on Coumadin.  EXAM: CT HEAD WITHOUT CONTRAST  CT CERVICAL SPINE WITHOUT CONTRAST  TECHNIQUE: Multidetector CT imaging of the head and cervical spine was performed following the standard protocol without intravenous contrast. Multiplanar CT image reconstructions of the cervical spine were also generated.  COMPARISON:  Prior CT scan of the head and cervical spine 06/27/2013  FINDINGS: CT HEAD FINDINGS  Negative for acute intracranial hemorrhage, acute infarction, mass, mass effect, hydrocephalus or midline shift. Gray-white differentiation is preserved throughout. A stable appearance of atrophy, prior right parietal infarct, prior right occipital infarct and extensive sequelae of longstanding chronic microvascular ischemic white matter disease. Stable ventricular configuration. No acute scalp hematoma or calvarial fracture. Normal aeration of the mastoid air cells. Mild mucoperiosteal thickening in the inferior aspect of the maxillary sinuses. Atherosclerosis in the bilateral cavernous carotid arteries.  CT CERVICAL SPINE FINDINGS  No acute fracture, malalignment or prevertebral soft tissue swelling.  Multilevel degenerative disc disease. Disc space narrowing most prominent at C5-C6. Multilevel facet arthropathy on the right at C3-C4 and C4-C5. The is Unremarkable CT appearance of the thyroid gland. No acute soft tissue abnormality. The lung apices are unremarkable.  IMPRESSION: CT HEAD  1. No acute intracranial abnormality. 2. Stable atrophy, remote infarcts involving the right parietal and right occipital regions and advanced chronic microvascular ischemic white matter disease. CT CSPINE  1. No acute fracture or malalignment. 2. Multilevel cervical spondylosis and right-sided facet arthropathy.   Electronically Signed   By: Jacqulynn Cadet M.D.   On: 11/14/2013 11:34   Mr Jodene Nam Head Wo Contrast  11/14/2013   CLINICAL DATA:  Stroke  EXAM: MRI HEAD WITHOUT AND WITH CONTRAST  MRA HEAD WITHOUT CONTRAST  MRA NECK WITHOUT AND WITH CONTRAST  TECHNIQUE: Multiplanar, multiecho pulse sequences of the brain and surrounding structures were obtained without and with intravenous contrast. Angiographic images of the Circle of Willis were obtained using MRA technique without intravenous contrast. Angiographic images of the neck were obtained using MRA technique without and with intravenous contrast. Carotid stenosis measurements (when applicable) are obtained utilizing NASCET criteria, using the distal internal carotid diameter as the denominator.  CONTRAST:  51mL MULTIHANCE GADOBENATE DIMEGLUMINE 529 MG/ML IV SOLN  COMPARISON:  CT head 11/14/2013  FINDINGS: MRI HEAD FINDINGS  Small area of acute infarct in the left frontal cortex above the sylvian fissure. No other acute infarct identified.  Generalized atrophy. Chronic ischemic changes throughout the white matter. 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 Mild chronic ischemia in the cerebellum bilaterally. Chronic infarct in the left thalamus. Scattered areas of chronic micro hemorrhage bilaterally most likely due to chronic hypertension  Postcontrast imaging reveals  no enhancing mass lesion.  MRA HEAD FINDINGS  Both vertebral arteries are patent to the basilar. Right AICA is patent with proximal stenosis. Left PICA patent. Basilar patent. Superior cerebellar arteries show mild disease proximally. Posterior cerebral arteries are patent bilaterally with mild atherosclerotic disease. No significant stenosis  Internal carotid artery widely patent bilaterally. Anterior and middle cerebral arteries are patent bilaterally. Moderate disease in right middle cerebral artery branches.  Negative for  cerebral aneurysm.  MRA NECK FINDINGS  Suboptimal image quality.  Suboptimal timing of the injection.  The internal carotid artery appears patent without significant stenosis bilaterally. Both vertebral arteries are patent without significant stenosis.  IMPRESSION: Subcentimeter focus of acute infarct in the left frontal cortex.  Mild intracranial atherosclerotic disease most prominent in the right middle cerebral artery.  Suboptimal MRA of the neck without significant carotid or vertebral stenosis in the neck.   Electronically Signed   By: Franchot Gallo M.D.   On: 11/14/2013 20:26   Mr Angiogram Neck W Wo Contrast  11/14/2013   CLINICAL DATA:  Stroke  EXAM: MRI HEAD WITHOUT AND WITH CONTRAST  MRA HEAD WITHOUT CONTRAST  MRA NECK WITHOUT AND WITH CONTRAST  TECHNIQUE: Multiplanar, multiecho pulse sequences of the brain and surrounding structures were obtained without and with intravenous contrast. Angiographic images of the Circle of Willis were obtained using MRA technique without intravenous contrast. Angiographic images of the neck were obtained using MRA technique without and with intravenous contrast. Carotid stenosis measurements (when applicable) are obtained utilizing NASCET criteria, using the distal internal carotid diameter as the denominator.  CONTRAST:  37mL MULTIHANCE GADOBENATE DIMEGLUMINE 529 MG/ML IV SOLN  COMPARISON:  CT head 11/14/2013  FINDINGS: MRI HEAD FINDINGS  Small  area of acute infarct in the left frontal cortex above the sylvian fissure. No other acute infarct identified.  Generalized atrophy. Chronic ischemic changes throughout the white matter. 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 Mild chronic ischemia in the cerebellum bilaterally. Chronic infarct in the left thalamus. Scattered areas of chronic micro hemorrhage bilaterally most likely due to chronic hypertension  Postcontrast imaging reveals no enhancing mass lesion.  MRA HEAD FINDINGS  Both vertebral arteries are patent to the basilar. Right AICA is patent with proximal stenosis. Left PICA patent. Basilar patent. Superior cerebellar arteries show mild disease proximally. Posterior cerebral arteries are patent bilaterally with mild atherosclerotic disease. No significant stenosis  Internal carotid artery widely patent bilaterally. Anterior and middle cerebral arteries are patent bilaterally. Moderate disease in right middle cerebral artery branches.  Negative for cerebral aneurysm.  MRA NECK FINDINGS  Suboptimal image quality.  Suboptimal timing of the injection.  The internal carotid artery appears patent without significant stenosis bilaterally. Both vertebral arteries are patent without significant stenosis.  IMPRESSION: Subcentimeter focus of acute infarct in the left frontal cortex.  Mild intracranial atherosclerotic disease most prominent in the right middle cerebral artery.  Suboptimal MRA of the neck without significant carotid or vertebral stenosis in the neck.   Electronically Signed   By: Franchot Gallo M.D.   On: 11/14/2013 20:26   Mr Jeri Cos MW Contrast  11/14/2013   CLINICAL DATA:  Stroke  EXAM: MRI HEAD WITHOUT AND WITH CONTRAST  MRA HEAD WITHOUT CONTRAST  MRA NECK WITHOUT AND WITH CONTRAST  TECHNIQUE: Multiplanar, multiecho pulse sequences of the brain and surrounding structures were obtained without and with intravenous contrast. Angiographic images of the Circle of Willis were obtained using  MRA technique without intravenous contrast. Angiographic images of the neck were obtained using MRA technique without and with intravenous contrast. Carotid stenosis measurements (when applicable) are obtained utilizing NASCET criteria, using the distal internal carotid diameter as the denominator.  CONTRAST:  35mL MULTIHANCE GADOBENATE DIMEGLUMINE 529 MG/ML IV SOLN  COMPARISON:  CT head 11/14/2013  FINDINGS: MRI HEAD FINDINGS  Small area of acute infarct in the left frontal cortex above the sylvian fissure. No other acute infarct identified.  Generalized atrophy.  Chronic ischemic changes throughout the white matter. 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 Mild chronic ischemia in the cerebellum bilaterally. Chronic infarct in the left thalamus. Scattered areas of chronic micro hemorrhage bilaterally most likely due to chronic hypertension  Postcontrast imaging reveals no enhancing mass lesion.  MRA HEAD FINDINGS  Both vertebral arteries are patent to the basilar. Right AICA is patent with proximal stenosis. Left PICA patent. Basilar patent. Superior cerebellar arteries show mild disease proximally. Posterior cerebral arteries are patent bilaterally with mild atherosclerotic disease. No significant stenosis  Internal carotid artery widely patent bilaterally. Anterior and middle cerebral arteries are patent bilaterally. Moderate disease in right middle cerebral artery branches.  Negative for cerebral aneurysm.  MRA NECK FINDINGS  Suboptimal image quality.  Suboptimal timing of the injection.  The internal carotid artery appears patent without significant stenosis bilaterally. Both vertebral arteries are patent without significant stenosis.  IMPRESSION: Subcentimeter focus of acute infarct in the left frontal cortex.  Mild intracranial atherosclerotic disease most prominent in the right middle cerebral artery.  Suboptimal MRA of the neck without significant carotid or vertebral stenosis in the neck.    Electronically Signed   By: Franchot Gallo M.D.   On: 11/14/2013 20:26   US Abdomen Complete  11/28/2013   CLINICAL DATA:  Nausea/vomiting  EXAM: ULTRASOUND ABDOMEN COMPLETE  COMPARISON:  11/07/2013  FINDINGS: Gallbladder:  Underdistended with mild secondary prominence of the gallbladder wall. 2.1 cm gallstone. No pericholecystic fluid. Murphy's sign could not be assessed due to medication.  Common bile duct:  Diameter: 5 mm.  Liver:  No focal lesion identified. Within normal limits in parenchymal echogenicity. Trace perihepatic fluid.  IVC:  No abnormality visualized.  Pancreas:  Poorly visualized.  Spleen:  Enlarged, measuring 14.4 cm.  Right Kidney:  Length: 10.0 cm. Echogenic renal parenchyma with cortical atrophy. 4 mm lower pole calculus. 2.4 x 2.0 x 2.0 cm lower pole cyst. No hydronephrosis.  Left Kidney:  Length: 12.9 cm. Echogenic renal parenchyma. 8 x 9 x 8 mm upper pole cyst. No hydronephrosis.  Abdominal aorta:  No aneurysm visualized.  Other findings:  Small right pleural effusion.  IMPRESSION: Cholelithiasis, without associated findings suggest acute cholecystitis.  Splenomegaly.  Echogenic renal parenchyma, suggesting medical renal disease. Bilateral renal cysts. 4 mm right lower pole renal calculus, without hydronephrosis.  Small right pleural effusion.   Electronically Signed   By: Julian Hy M.D.   On: 11/28/2013 07:23   US Abdomen Complete  11/07/2013   CLINICAL DATA:  Severe sepsis.  Elevated LFTs.  EXAM: ULTRASOUND ABDOMEN COMPLETE  COMPARISON:  CT abdomen and pelvis from 08/03/2013, and renal ultrasound performed 02/13/2012  FINDINGS: Gallbladder:  Contracted and not well assessed. A 2.0 cm stone is noted within the gallbladder. This appearance is grossly unchanged from the prior CT. No ultrasonographic Murphy's sign is elicited.  Common bile duct:  Diameter: 0.4 cm, within normal limits in caliber.  Liver:  No focal lesion identified. Diffuse soft tissue thickening about the porta  hepatis is thought to reflect periportal edema, also noted on prior CT. Within normal limits in parenchymal echogenicity. Trace ascites is noted about the liver.  IVC:  Not fully characterized, but grossly unremarkable in appearance.  Pancreas:  The visualized portions of the pancreas are grossly unremarkable, though the pancreas is difficult to fully assess.  Spleen:  Size and appearance within normal limits.  Right Kidney:  Length: 13.1 cm. Echogenicity within normal limits. Trace perinephric fluid is noted. No hydronephrosis  visualized. A 2.3 cm cyst is noted at the lower pole of the right kidney.  Left Kidney:  Length: 12.7 cm. Echogenicity within normal limits. Trace perinephric fluid is noted. No hydronephrosis visualized. A 0.9 cm cyst is noted at the upper pole of the left kidney.  Abdominal aorta:  No aneurysm visualized. Not fully characterized due to overlying bowel gas.  Other findings:  None.  IMPRESSION: 1. No acute abnormality seen within the abdomen. Evaluation is mildly suboptimal due to bowel gas. 2. Gallbladder contracted and not well assessed, with a 2.0 cm stone in the gallbladder. This appearance is stable from the prior CT. No ultrasonographic Murphy's sign elicited. No evidence for acute cholecystitis or obstruction. 3. Periportal edema again noted. 4. Trace perinephric fluid noted bilaterally, mildly more prominent than on the prior CT and of uncertain significance. Small bilateral renal cysts seen. 5. Trace ascites noted about the liver.   Electronically Signed   By: Garald Balding M.D.   On: 11/07/2013 21:23   Dg Chest Port 1 View  12/04/2013   CLINICAL DATA:  Shortness of breath.  EXAM: PORTABLE CHEST - 1 VIEW  COMPARISON:  11/28/2013 and CT angio chest 11/27/2013 and earlier priors.  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 Stable chronic moderate cardiac enlargement. Heavy mitral annulus calcifications are noted. There is diffuse bilateral interstitial prominence,  similar to recent chest radiographs of 11/15/2013 and 11/16/2013. Pulmonary vascularity appears slightly prominent. No consolidation. No visible pleural effusion. Negative for pneumothorax.  Right upper extremity PICC appears stable, projecting over the mid SVC.  IMPRESSION: Stable chronic moderate cardiomegaly. Diffuse interstitial prominence may reflect interstitial pulmonary edema.   Electronically Signed   By: Curlene Dolphin M.D.   On: 12/04/2013 12:37   Dg Chest Port 1 View  11/28/2013   CLINICAL DATA:  Labored breathing. History of congestive heart failure, hypertension, lupus  EXAM: PORTABLE CHEST - 1 VIEW  COMPARISON:  11/27/2013; 11/16/2013; chest CT -11/27/2013  FINDINGS: Grossly unchanged enlarged cardiac silhouette and mediastinal contours post median sternotomy. Exuberant calcifications within the mitral valve annulus. Interval placement of right upper extremity approach PICC line with tip projected over the superior cavoatrial junction. Chronic findings of pulmonary venous congestion. Minimally improved aeration of lungs with persistent bibasilar opacities and linear heterogeneous opacities in the peripheral aspect the right mid lung. No new focal airspace opacities chronic trace right-sided effusion is suspected. No pneumothorax. Unchanged bones.  IMPRESSION: 1. Similar findings of cardiomegaly and pulmonary venous congestion without acute cardiopulmonary disease. 2. Unchanged trace right-sided pleural effusion. 3. Minimally improved aeration of the lungs with persistent bibasilar opacities, likely atelectasis.   Electronically Signed   By: Sandi Mariscal M.D.   On: 11/28/2013 12:11   Dg Chest Port 1 View  11/27/2013   CLINICAL DATA:  Chest pain.  EXAM: PORTABLE CHEST - 1 VIEW  COMPARISON:  11/16/2013  FINDINGS: Stable cardiomegaly status post prior CABG. There is some scarring and atelectasis at the right lung base. No overt pulmonary edema or pleural fluid. No pneumothorax.  IMPRESSION: Atelectasis  and scarring at the right lung base. Stable cardiomegaly.   Electronically Signed   By: Aletta Edouard M.D.   On: 11/27/2013 21:38   Dg Chest Port 1 View  11/16/2013   CLINICAL DATA:  PICC line placement.  EXAM: PORTABLE CHEST - 1 VIEW  COMPARISON:  None.  FINDINGS: The heart is enlarged but stable. Persistent edema and atelectasis. The right PICC line tip is located just  below the level of the carina, approximately 4 cm above the cavoatrial junction. No pleural effusion or pneumothorax.  IMPRESSION: The right PICC line tip is in the mid SVC.   Electronically Signed   By: Kalman Jewels M.D.   On: 11/16/2013 13:03   Dg Chest Portable 1 View  11/14/2013   CLINICAL DATA:  Altered mental status  EXAM: PORTABLE CHEST - 1 VIEW  COMPARISON:  Prior chest x-ray 11/07/2013  FINDINGS: Stable cardiomegaly. Patient is status post median sternotomy with evidence of prior CABG. Patient is status post median sternotomy with evidence of prior CABG. Atherosclerotic calcifications present within the transverse aorta. Is slightly lower inspiratory volumes.a increased pulmonary vascular congestion now with mild interstitial edema. Kerley B-lines are noted in the periphery of the right lung. No focal airspace consolidation, pleural effusion or pneumothorax. No acute osseous abnormality.  IMPRESSION: 1. Interval development of mild interstitial edema concerning for mild CHF.   Electronically Signed   By: Jacqulynn Cadet M.D.   On: 11/14/2013 10:20    Microbiology: Recent Results (from the past 240 hour(s))  CULTURE, BLOOD (ROUTINE X 2)     Status: None   Collection Time    11/27/13  9:04 PM      Result Value Ref Range Status   Specimen Description BLOOD LEFT ARM   Final   Special Requests     Final   Value: BOTTLES DRAWN AEROBIC AND ANAEROBIC 8CC BLUE,6CC RED   Culture  Setup Time     Final   Value: 11/28/2013 10:18     Performed at [facility]   Culture     Final   Value: NO GROWTH 5 DAYS     Note:  Culture results may be compromised due to an excessive volume of blood received in culture bottles.     Performed at [facility]   Report Status 12/04/2013 FINAL   Final  CULTURE, BLOOD (ROUTINE X 2)     Status: None   Collection Time    11/27/13 10:02 PM      Result Value Ref Range Status   Specimen Description BLOOD LEFT HAND   Final   Special Requests BOTTLES DRAWN AEROBIC ONLY 5CC   Final   Culture  Setup Time     Final   Value: 11/28/2013 10:17     Performed at [facility]   Culture     Final   Value: NO GROWTH 5 DAYS     Performed at [facility]   Report Status 12/04/2013 FINAL   Final  MRSA PCR SCREENING     Status: None   Collection Time    11/28/13  2:46 AM      Result Value Ref Range Status   MRSA by PCR NEGATIVE  NEGATIVE Final   Comment:            The GeneXpert MRSA Assay (FDA     approved for NASAL specimens     only), is one component of a     comprehensive MRSA colonization     surveillance program. It is not     intended to diagnose MRSA     infection nor to guide or     monitor treatment for     MRSA infections.  CLOSTRIDIUM DIFFICILE BY PCR     Status: None   Collection Time    11/30/13  3:07 PM      Result Value Ref Range Status   C  difficile by pcr NEGATIVE  NEGATIVE Final     Labs: Basic Metabolic Panel:  Recent Labs Lab 12/01/13 0330 12/03/13 0600 12/04/13 1400 12/07/13 0341  NA 138 136* 139 139  K 4.7 5.0 3.7 3.0*  CL 103 103 104 102  CO2 23 19 21 26   GLUCOSE 80 71 82 77  BUN 18 17 17 8   CREATININE 1.52* 1.58* 1.56* 0.96  CALCIUM 8.5 8.7 8.6 8.3*   Liver Function Tests: No results found for this basename: AST, ALT, ALKPHOS, BILITOT, PROT, ALBUMIN,  in the last 168 hours No results found for this basename: LIPASE, AMYLASE,  in the last 168 hours No results found for this basename: AMMONIA,  in the last 168 hours CBC:  Recent Labs Lab 12/01/13 0330 12/02/13 0645 12/03/13 0600 12/04/13 1400  WBC 2.9*  4.2 3.4* 4.1  HGB 11.3* 11.1* 11.0* 11.8*  HCT 36.0 34.7* 35.0* 36.3  MCV 98.4 99.7 99.4 96.8  PLT 101* 123* 111* 117*   Cardiac Enzymes: No results found for this basename: CKTOTAL, CKMB, CKMBINDEX, TROPONINI,  in the last 168 hours BNP: BNP (last 3 results)  Recent Labs  11/14/13 0923 11/27/13 2111 12/04/13 1400  PROBNP 13807.0* 7774.0* 6091.0*   CBG: No results found for this basename: GLUCAP,  in the last 168 hours     Signed:  Kelvin Cellar  [facility] 12/07/2013, 11:07 AM  Addendum I personally saw and evaluated patient on 12/08/2013. She remains stable. Plan had been for her to be discharged yesterday to [facility], however, this was postponed for today. As mentioned above will attempt rehabilitation, continue supportive care, maximize medical management. Family explained that she could transfer to hospice from SNF if there was further decline.

## 2013-12-07 NOTE — Progress Notes (Signed)
[facility] PROGRESS NOTE  [name] PNT:[id number] DOB: 04/26/1940 DOA: 11/27/2013 PCP: Hollace Kinnier, DO  Assessment/Plan: 1. Atrial fibrillation rapid ventricular response -Heart rates remained in the 90s to 100's -She is on metoprolol 12.5 mg by mouth twice a day and amiodarone 200 mg by mouth daily -Anticoagulated with warfarin, pharmacy consultation for warfarin management. -Last transthoracic echocardiogram performed on 08/16/2013 which showed an ejection fraction of 60-65% without wall motion abnormalities.   2.  History of prostatic valve endocarditis. -Dr. Johnnye Sima of infectious disease consulted, she is currently on IV Zosyn -Plan to discharge her on amoxicillin per infectious disease  3.  C Diff Colitis -Patient seen and evaluated by infectious disease, plan on 6 weeks of by mouth vancomycin taper  4. Chronic diastolic congestive heart failure -Last transthoracic echocardiogram performed on 11/16/2013 showing ejection fraction of 60-65%   6. Goals of Care  -Plan to discharge to SNF for rehab given patient's significant improvement.     Code Status: DNR Family Communication: Family meeting held Disposition Plan: [facility]ticipate [name].    Consultants:  Infectious disease  Palliative Care  Antibiotics:  Vancomycin by mouth  IV Zosyn  HPI/Subjective: Patient is a pleasant 73-year-old female with a history of recurrent endocarditis, chronic atrial fibrillation, blood pressure like aortic valve was admitted to the medicine service on 11/28/2013, patient presenting with nausea and vomiting. She says be in A. fib with RVR, which likely precipitated acute decompensated congestive heart failure. Infectious disease was consulted given history of endocarditis.      Objective: Filed Vitals:   12/07/13 1338  BP: 114/75  Pulse: 107  Temp: 97.5 F (36.4 C)  Resp: 18    Intake/Output Summary (Last 24 hours) at 12/07/13  1459 Last data filed at 12/07/13 1300  Gross per 24 hour  Intake    850 ml  Output    600 ml  Net    250 ml   Filed Weights   12/05/13 0525 12/06/13 0550 12/07/13 0607  Weight: 77.202 kg (170 lb 3.2 oz) 77 kg (169 lb 12.1 oz) 76.841 kg (169 lb 6.5 oz)    Exam:   General:  Patient is awake and alert, sitting up having her breakfast. NAD  Cardiovascular: Regular rate and rhythm normal S1-S2, tachycardic  Respiratory: Clear to auscultation bilaterally  Abdomen: Soft nontender nondistended  Musculoskeletal: No edema  Data Reviewed: Basic Metabolic Panel:  Recent Labs Lab 12/01/13 0330 12/03/13 0600 12/04/13 1400 12/07/13 0341  NA 138 136* 139 139  K 4.7 5.0 3.7 3.0*  CL 103 103 104 102  CO2 23 19 21 26   GLUCOSE 80 71 82 77  BUN 18 17 17 8   CREATININE 1.52* 1.58* 1.56* 0.96  CALCIUM 8.5 8.7 8.6 8.3*   Liver Function Tests: No results found for this basename: AST, ALT, ALKPHOS, BILITOT, PROT, ALBUMIN,  in the last 168 hours No results found for this basename: LIPASE, AMYLASE,  in the last 168 hours No results found for this basename: AMMONIA,  in the last 168 hours CBC:  Recent Labs Lab 12/01/13 0330 12/02/13 0645 12/03/13 0600 12/04/13 1400  WBC 2.9* 4.2 3.4* 4.1  HGB 11.3* 11.1* 11.0* 11.8*  HCT 36.0 34.7* 35.0* 36.3  MCV 98.4 99.7 99.4 96.8  PLT 101* 123* 111* 117*   Cardiac Enzymes: No results found for this basename: CKTOTAL, CKMB, CKMBINDEX, TROPONINI,  in the last 168 hours BNP (last 3 results)  Recent Labs  11/14/13 0923 11/27/13  2111 12/04/13 1400  PROBNP 13807.0* 7774.0* 6091.0*   CBG: No results found for this basename: GLUCAP,  in the last 168 hours  Recent Results (from the past 240 hour(s))  CULTURE, BLOOD (ROUTINE X 2)     Status: None   Collection Time    11/27/13  9:04 PM      Result Value Ref Range Status   Specimen Description BLOOD LEFT ARM   Final   Special Requests     Final   Value: BOTTLES DRAWN AEROBIC AND ANAEROBIC  8CC BLUE,6CC RED   Culture  Setup Time     Final   Value: 11/28/2013 10:18     Performed at [facility]   Culture     Final   Value: NO GROWTH 5 DAYS     Note: Culture results may be compromised due to an excessive volume of blood received in culture bottles.     Performed at [facility]   Report Status 12/04/2013 FINAL   Final  CULTURE, BLOOD (ROUTINE X 2)     Status: None   Collection Time    11/27/13 10:02 PM      Result Value Ref Range Status   Specimen Description BLOOD LEFT HAND   Final   Special Requests BOTTLES DRAWN AEROBIC ONLY 5CC   Final   Culture  Setup Time     Final   Value: 11/28/2013 10:17     Performed at [facility]   Culture     Final   Value: NO GROWTH 5 DAYS     Performed at [facility]   Report Status 12/04/2013 FINAL   Final  MRSA PCR SCREENING     Status: None   Collection Time    11/28/13  2:46 AM      Result Value Ref Range Status   MRSA by PCR NEGATIVE  NEGATIVE Final   Comment:            The GeneXpert MRSA Assay (FDA     approved for NASAL specimens     only), is one component of a     comprehensive MRSA colonization     surveillance program. It is not     intended to diagnose MRSA     infection nor to guide or     monitor treatment for     MRSA infections.  CLOSTRIDIUM DIFFICILE BY PCR     Status: None   Collection Time    11/30/13  3:07 PM      Result Value Ref Range Status   C difficile by pcr NEGATIVE  NEGATIVE Final     Studies: No results found.  Scheduled Meds: . amiodarone  200 mg Oral Daily  . amoxicillin  500 mg Oral Q12H  . feeding supplement (ENSURE COMPLETE)  237 mL Oral Q24H  . feeding supplement (ENSURE)  1 Container Oral BID PC  . furosemide  60 mg Oral Daily  . gabapentin  300 mg Oral QHS  . metoprolol tartrate  12.5 mg Oral BID  . sodium chloride  10-40 mL Intracatheter Q12H  . sodium chloride  3 mL Intravenous Q12H  . vancomycin  125 mg Oral Q12H  . warfarin  1 mg Oral  ONCE-1800  . Warfarin - Pharmacist Dosing Inpatient   Does not apply q1800   Continuous Infusions:   Principal Problem:   Atrial fibrillation with RVR Active Problems:   Prosthetic valve endocarditis   MIXED CONNECTIVE TISSUE DISEASE  CKD (chronic kidney disease) stage 3, GFR 30-59 ml/min   Enteritis due to Clostridium difficile   CHF (congestive heart failure)   Nausea & vomiting    Time spent: 25 minutes    Kelvin Cellar  [facility] Pager [phone]. If 7PM-7AM, please contact night-coverage at [email or web address], password [email or web address] 12/07/2013, 2:59 PM  LOS: 10 days

## 2013-12-08 LAB — PROTIME-INR
INR: 2.43 — AB (ref 0.00–1.49)
Prothrombin Time: 26.4 seconds — ABNORMAL HIGH (ref 11.6–15.2)

## 2013-12-08 MED ORDER — WARFARIN SODIUM 4 MG PO TABS: 4.0000 mg | ORAL_TABLET | ORAL | Status: DC

## 2013-12-08 MED ORDER — POTASSIUM CHLORIDE CRYS ER 20 MEQ PO TBCR
40.0000 meq | EXTENDED_RELEASE_TABLET | Freq: Every day | ORAL | Status: DC
  Administered 2013-12-08: 40 meq via ORAL
  Filled 2013-12-08: qty 2

## 2013-12-08 MED ORDER — WARFARIN SODIUM 4 MG PO TABS
4.0000 mg | ORAL_TABLET | ORAL | Status: DC
  Filled 2013-12-08: qty 1

## 2013-12-08 MED ORDER — WARFARIN SODIUM 2 MG PO TABS: 2.0000 mg | ORAL_TABLET | ORAL | Status: DC

## 2013-12-08 NOTE — Clinical Social Work Note (Signed)
Pt to be discharged to [facility]. Pt's daughter, Cecilio Asper, updated via phone.  Pt to be transported via [facility] (EM[facility]) RN provided with report number, [id number].  Lubertha Sayres, [place] [phone]) Licensed Clinical Social Worker Neuroscience [phone]) and Medical ICU (7M)

## 2013-12-08 NOTE — Progress Notes (Signed)
Report called to [employer] at [facility]. [facility] used to give report. Transportation has been set up for 11 AM

## 2013-12-09 ENCOUNTER — Non-Acute Institutional Stay (SKILLED_NURSING_FACILITY): Payer: Medicare Other | Admitting: Adult Health

## 2013-12-09 ENCOUNTER — Encounter: Payer: Self-pay | Admitting: Adult Health

## 2013-12-09 DIAGNOSIS — M329 Systemic lupus erythematosus, unspecified: Secondary | ICD-10-CM

## 2013-12-09 DIAGNOSIS — N183 Chronic kidney disease, stage 3 unspecified: Secondary | ICD-10-CM

## 2013-12-09 DIAGNOSIS — Z5189 Encounter for other specified aftercare: Secondary | ICD-10-CM

## 2013-12-09 DIAGNOSIS — I38 Endocarditis, valve unspecified: Secondary | ICD-10-CM

## 2013-12-09 DIAGNOSIS — T826XXD Infection and inflammatory reaction due to cardiac valve prosthesis, subsequent encounter: Secondary | ICD-10-CM

## 2013-12-09 DIAGNOSIS — D62 Acute posthemorrhagic anemia: Secondary | ICD-10-CM

## 2013-12-09 DIAGNOSIS — I5032 Chronic diastolic (congestive) heart failure: Secondary | ICD-10-CM

## 2013-12-09 DIAGNOSIS — T827XXA Infection and inflammatory reaction due to other cardiac and vascular devices, implants and grafts, initial encounter: Secondary | ICD-10-CM

## 2013-12-09 DIAGNOSIS — G2581 Restless legs syndrome: Secondary | ICD-10-CM

## 2013-12-09 DIAGNOSIS — A0472 Enterocolitis due to Clostridium difficile, not specified as recurrent: Secondary | ICD-10-CM

## 2013-12-09 DIAGNOSIS — I509 Heart failure, unspecified: Secondary | ICD-10-CM

## 2013-12-09 DIAGNOSIS — Z7901 Long term (current) use of anticoagulants: Secondary | ICD-10-CM

## 2013-12-09 DIAGNOSIS — I4891 Unspecified atrial fibrillation: Secondary | ICD-10-CM

## 2013-12-09 DIAGNOSIS — K219 Gastro-esophageal reflux disease without esophagitis: Secondary | ICD-10-CM

## 2013-12-09 NOTE — Progress Notes (Addendum)
Patient ID: [name], female   DOB: [birth year], 73 y.o.   MRN: [id number]               PROGRESS NOTE  DATE: 12/09/2013  FACILITY: Nursing Home Location: [facility]  LEVEL OF CARE: SNF (31)  Acute Visit  CHIEF COMPLAINT:  Follow-up Hospitalization  HISTORY OF PRESENT ILLNESS: This is a 73 year old female who has been admitted to [facility] on 12/09/14 from [facility] with principal diagnosis of atrial fibrillation with RVR. She has been admitted for a short-term rehabilitation.  REASSESSMENT OF ONGOING PROBLEM(S):  CHF:The patient does not relate significant weight changes, denies sob, DOE, orthopnea, PNDs,  palpitations or chest pain.  CHF remains stable.  No complications form the medications being used.  GERD: pt's GERD is stable.  Denies ongoing heartburn, abd. Pain, nausea or vomiting.  Currently on a PPI & tolerates it without any adverse reactions.  ANEMIA: The anemia has been stable. The patient denies fatigue, melena or hematochezia. No complications from the medications currently being used. 9/15 hgb 11.8  PAST MEDICAL HISTORY : Reviewed.  No changes/see problem list  CURRENT MEDICATIONS: Reviewed per MAR/see medication list  REVIEW OF SYSTEMS:  GENERAL: no fatigue, no fever, chills  RESPIRATORY: no cough, SOB, DOE, wheezing, hemoptysis CARDIAC: no chest pain, or palpitations, +edema GI: no abdominal pain, diarrhea, constipation, heart burn, nausea or vomiting  PHYSICAL EXAMINATION  GENERAL: no acute distress, normal body habitus EYES: conjunctivae normal, sclerae normal, normal eye lids NECK: supple, trachea midline, no neck masses, no thyroid tenderness, no thyromegaly LYMPHATICS: no LAN in the neck, no supraclavicular LAN RESPIRATORY: breathing is even & unlabored, BS CTAB CARDIAC: Irregularly irregular, no murmur,no extra heart sounds, BLE trace edema GI: abdomen soft, normal BS, no masses, no tenderness, no hepatomegaly, no  splenomegaly EXTREMITIES:  Able to move all 4 extremities; Right upper arm double lumen PICC PSYCHIATRIC: the patient is alert & oriented to person, affect & behavior appropriate  LABS/RADIOLOGY: Labs reviewed: Basic Metabolic Panel:  Recent Labs  11/07/13 1340  12/03/13 0600 12/04/13 1400 12/07/13 0341  NA  --   < > 136* 139 139  K  --   < > 5.0 3.7 3.0*  CL  --   < > 103 104 102  CO2  --   < > 19 21 26   GLUCOSE  --   < > 71 82 77  BUN  --   < > 17 17 8   CREATININE  --   < > 1.58* 1.56* 0.96  CALCIUM  --   < > 8.7 8.6 8.3*  MG 2.2  --   --   --   --   PHOS 4.4  --   --   --   --   < > = values in this interval not displayed. Liver Function Tests:  Recent Labs  11/14/13 0923 11/27/13 2108 11/28/13 0550  AST 54* 30 26  ALT 308* 17 15  ALKPHOS 144* 225* 184*  BILITOT 2.3* 1.4* 1.5*  PROT 7.1 7.0 6.1  ALBUMIN 3.3* 2.8* 2.5*    Recent Labs  12/16/12 0845  LIPASE 64*   CBC:  Recent Labs  11/14/13 0923  11/27/13 2108 11/28/13 0550  12/02/13 0645 12/03/13 0600 12/04/13 1400  WBC 14.9*  < > 7.0 4.9  < > 4.2 3.4* 4.1  NEUTROABS 11.7*  --  4.5 3.1  --   --   --   --  HGB 12.5  < > 13.0 11.5*  < > 11.1* 11.0* 11.8*  HCT 39.3  < > 40.4 34.3*  < > 34.7* 35.0* 36.3  MCV 97.0  < > 100.5* 96.6  < > 99.7 99.4 96.8  PLT 145*  < > 117* 99*  < > 123* 111* 117*  < > = values in this interval not displayed.  Lipid Panel:  Recent Labs  04/27/13 1101 11/15/13 0340  HDL 46 38*   Cardiac Enzymes:  Recent Labs  12/14/12 0531 09/12/13 1122 11/07/13 0140  11/28/13 0550 11/28/13 0900 11/28/13 1525  CKTOTAL 27 87 156  --   --   --   --   TROPONINI  --  <0.30  --   < > <0.30 <0.30 <0.30  < > = values in this interval not displayed.  CBG:  Recent Labs  11/07/13 0828 11/07/13 0933 11/28/13 0255  GLUCAP 50* 138* 90     EXAM: CT HEAD WITHOUT CONTRAST   TECHNIQUE: Contiguous axial images were obtained from the base of the skull through the vertex  without intravenous contrast.   COMPARISON:  Head and cervical spine CT 1039 hr the same day and earlier.   FINDINGS: Stable paranasal sinuses and mastoids. No acute osseous abnormality identified. Stable orbit and scalp soft tissues.   Calcified atherosclerosis at the skull base. 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 Left thalamic and right caudate lacunar infarcts also are stable. Patchy and confluent cerebral white matter hypodensity also has not significantly changed. Scattered small lacunar infarcts in both cerebellar hemispheres also appear stable. No midline shift, mass effect, or evidence of intracranial mass lesion. No acute intracranial hemorrhage identified. No evidence of cortically based acute infarction identified. No suspicious intracranial vascular hyperdensity.   IMPRESSION: 1. Stable non contrast CT appearance of the brain. No new intracranial abnormality identified. 2. Chronic ischemic disease detailed above. Study discussed by telephone with Dr. Wallie Char on 11/14/2013 at 1718 hrs.   EXAM: MRI HEAD WITHOUT AND WITH CONTRAST   MRA HEAD WITHOUT CONTRAST   MRA NECK WITHOUT AND WITH CONTRAST   TECHNIQUE: Multiplanar, multiecho pulse sequences of the brain and surrounding structures were obtained without and with intravenous contrast. Angiographic images of the Circle of Willis were obtained using MRA technique without intravenous contrast. Angiographic images of the neck were obtained using MRA technique without and with intravenous contrast. Carotid stenosis measurements (when applicable) are obtained utilizing NASCET criteria, using the distal internal carotid diameter as the denominator.   CONTRAST:  39mL MULTIHANCE GADOBENATE DIMEGLUMINE 529 MG/ML IV SOLN   COMPARISON:  CT head 11/14/2013   FINDINGS: MRI HEAD FINDINGS   Small area of acute infarct in the left frontal cortex above  the sylvian fissure. No other acute infarct identified.   Generalized atrophy. Chronic ischemic changes throughout the white matter. 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 Mild chronic ischemia in the cerebellum bilaterally. Chronic infarct in the left thalamus. Scattered areas of chronic micro hemorrhage bilaterally most likely due to chronic hypertension   Postcontrast imaging reveals no enhancing mass lesion.   MRA HEAD FINDINGS   Both vertebral arteries are patent to the basilar. Right AICA is patent with proximal stenosis. Left PICA patent. Basilar patent. Superior cerebellar arteries show mild disease proximally. Posterior cerebral arteries are patent bilaterally with mild atherosclerotic disease. No significant stenosis   Internal carotid artery widely patent bilaterally. Anterior and middle cerebral arteries are patent bilaterally. Moderate  disease in right middle cerebral artery branches.   Negative for cerebral aneurysm.   MRA NECK FINDINGS   Suboptimal image quality.  Suboptimal timing of the injection.   The internal carotid artery appears patent without significant stenosis bilaterally. Both vertebral arteries are patent without significant stenosis.   IMPRESSION: Subcentimeter focus of acute infarct in the left frontal cortex.   Mild intracranial atherosclerotic disease most prominent in the right middle cerebral artery.   Suboptimal MRA of the neck without significant carotid or vertebral stenosis in the neck. EXAM: MRI HEAD WITHOUT AND WITH CONTRAST   MRA HEAD WITHOUT CONTRAST   MRA NECK WITHOUT AND WITH CONTRAST   TECHNIQUE: Multiplanar, multiecho pulse sequences of the brain and surrounding structures were obtained without and with intravenous contrast. Angiographic images of the Circle of Willis were obtained using MRA technique without intravenous contrast. Angiographic images of the neck were obtained using MRA technique without and  with intravenous contrast. Carotid stenosis measurements (when applicable) are obtained utilizing NASCET criteria, using the distal internal carotid diameter as the denominator.   CONTRAST:  18mL MULTIHANCE GADOBENATE DIMEGLUMINE 529 MG/ML IV SOLN   COMPARISON:  CT head 11/14/2013   FINDINGS: MRI HEAD FINDINGS   Small area of acute infarct in the left frontal cortex above the sylvian fissure. No other acute infarct identified.   Generalized atrophy. Chronic ischemic changes throughout the white matter. 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 Mild chronic ischemia in the cerebellum bilaterally. Chronic infarct in the left thalamus. Scattered areas of chronic micro hemorrhage bilaterally most likely due to chronic hypertension   Postcontrast imaging reveals no enhancing mass lesion.   MRA HEAD FINDINGS   Both vertebral arteries are patent to the basilar. Right AICA is patent with proximal stenosis. Left PICA patent. Basilar patent. Superior cerebellar arteries show mild disease proximally. Posterior cerebral arteries are patent bilaterally with mild atherosclerotic disease. No significant stenosis   Internal carotid artery widely patent bilaterally. Anterior and middle cerebral arteries are patent bilaterally. Moderate disease in right middle cerebral artery branches.   Negative for cerebral aneurysm.   MRA NECK FINDINGS   Suboptimal image quality.  Suboptimal timing of the injection.   The internal carotid artery appears patent without significant stenosis bilaterally. Both vertebral arteries are patent without significant stenosis.   IMPRESSION: Subcentimeter focus of acute infarct in the left frontal cortex.   Mild intracranial atherosclerotic disease most prominent in the right middle cerebral artery.   Suboptimal MRA of the neck without significant carotid or vertebral stenosis in the neck. EXAM: MRI HEAD WITHOUT AND WITH CONTRAST   MRA HEAD WITHOUT  CONTRAST   MRA NECK WITHOUT AND WITH CONTRAST   TECHNIQUE: Multiplanar, multiecho pulse sequences of the brain and surrounding structures were obtained without and with intravenous contrast. Angiographic images of the Circle of Willis were obtained using MRA technique without intravenous contrast. Angiographic images of the neck were obtained using MRA technique without and with intravenous contrast. Carotid stenosis measurements (when applicable) are obtained utilizing NASCET criteria, using the distal internal carotid diameter as the denominator.   CONTRAST:  83mL MULTIHANCE GADOBENATE DIMEGLUMINE 529 MG/ML IV SOLN   COMPARISON:  CT head 11/14/2013   FINDINGS: MRI HEAD FINDINGS   Small area of acute infarct in the left frontal cortex above the sylvian fissure. No other acute infarct identified.   Generalized atrophy. Chronic ischemic changes throughout the white matter. 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 Mild chronic ischemia in the cerebellum bilaterally.  Chronic infarct in the left thalamus. Scattered areas of chronic micro hemorrhage bilaterally most likely due to chronic hypertension   Postcontrast imaging reveals no enhancing mass lesion.   MRA HEAD FINDINGS   Both vertebral arteries are patent to the basilar. Right AICA is patent with proximal stenosis. Left PICA patent. Basilar patent. Superior cerebellar arteries show mild disease proximally. Posterior cerebral arteries are patent bilaterally with mild atherosclerotic disease. No significant stenosis   Internal carotid artery widely patent bilaterally. Anterior and middle cerebral arteries are patent bilaterally. Moderate disease in right middle cerebral artery branches.   Negative for cerebral aneurysm.   MRA NECK FINDINGS   Suboptimal image quality.  Suboptimal timing of the injection.   The internal carotid artery appears patent without significant stenosis bilaterally. Both vertebral  arteries are patent without significant stenosis.   IMPRESSION: Subcentimeter focus of acute infarct in the left frontal cortex.   Mild intracranial atherosclerotic disease most prominent in the right middle cerebral artery.   Suboptimal MRA of the neck without significant carotid or vertebral stenosis in the neck.   EXAM: CT ANGIOGRAPHY CHEST WITH CONTRAST   TECHNIQUE: Multidetector CT imaging of the chest was performed using the standard protocol during bolus administration of intravenous contrast. Multiplanar CT image reconstructions and MIPs were obtained to evaluate the vascular anatomy.   CONTRAST:  61mL OMNIPAQUE IOHEXOL 350 MG/ML SOLN   COMPARISON:  Chest radiograph November 27, 2013 and CT of the chest July 11, 2012.   FINDINGS: Adequate pulmonary arterial contrast opacification. Main pulmonary artery is enlarged, 3.9 cm. Nonocclusive segmental to subsegmental right lower lobe pulmonary eccentric arterial filling defect, appearing adherent and chronic though new from prior imaging (axial 136/306).   Heart size is moderate to severely enlarged, with dilated right ventricle. Reflux of contrast into the liver consistent with right heart failure. Aortic annular calcifications. No right heart strain. Coronary artery calcifications. No pericardial fluid collections. Thoracic aorta appears normal in course and caliber, mild calcific atherosclerosis. Limited assessment for lymphadenopathy due to bolus timing.   Patchy ground-glass opacities, small to moderate right and small left pleural effusion. Interlobular septal thickening in the lung bases. Tracheobronchial tree is patent and midline. No pneumothorax.   16 mm gallstone. Soft tissues are nonsuspicious. Status post median sternotomy. Mild thoracic spondylosis without destructive bony lesions.   Review of the MIP images confirms the above findings.   IMPRESSION: No convincing evidence of acute pulmonary  embolism; nonocclusive right lower lobe segmental to subsegmental pulmonary embolus appears chronic. No right heart strain. Similar enlarged pulmonary arteries suggesting sequelae of pulmonary arterial hypertension.   Cardiomegaly with evidence of right heart failure. Findings of acute and chronic pulmonary edema with small to moderate right and small left pleural effusions.   EXAM: ABDOMEN - 1 VIEW   COMPARISON:  CT abdomen and pelvis 08/03/2013   FINDINGS: The bowel gas pattern is normal. No radio-opaque calculi or other significant radiographic abnormality are seen. Residual contrast material in the bladder.   IMPRESSION: Nonobstructive bowel gas pattern.   EXAM: ULTRASOUND ABDOMEN COMPLETE   COMPARISON:  11/07/2013   FINDINGS: Gallbladder:   Underdistended with mild secondary prominence of the gallbladder wall. 2.1 cm gallstone. No pericholecystic fluid. Murphy's sign could not be assessed due to medication.   Common bile duct:   Diameter: 5 mm.   Liver:   No focal lesion identified. Within normal limits in parenchymal echogenicity. Trace perihepatic fluid.   IVC:   No abnormality visualized.   Pancreas:  Poorly visualized.   Spleen:   Enlarged, measuring 14.4 cm.   Right Kidney:   Length: 10.0 cm. Echogenic renal parenchyma with cortical atrophy. 4 mm lower pole calculus. 2.4 x 2.0 x 2.0 cm lower pole cyst. No hydronephrosis.   Left Kidney:   Length: 12.9 cm. Echogenic renal parenchyma. 8 x 9 x 8 mm upper pole cyst. No hydronephrosis.   Abdominal aorta:   No aneurysm visualized.   Other findings:   Small right pleural effusion.   IMPRESSION: Cholelithiasis, without associated findings suggest acute cholecystitis.   Splenomegaly.   Echogenic renal parenchyma, suggesting medical renal disease. Bilateral renal cysts. 4 mm right lower pole renal calculus, without hydronephrosis.   Small right pleural effusion. EXAM: CT HEAD  WITHOUT CONTRAST   TECHNIQUE: Contiguous axial images were obtained from the base of the skull through the vertex without intravenous contrast.   COMPARISON:  MRI brain dated 11/14/2013   FINDINGS: No evidence of parenchymal hemorrhage or extra-axial fluid collection. No mass lesion, mass effect, or midline shift.   No CT evidence of acute infarction.   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   Extensive small vessel ischemic changes. Intracranial atherosclerosis.   Global cortical atrophy.  No ventriculomegaly.   The visualized paranasal sinuses are essentially clear. The mastoid air cells are unopacified.   No evidence of calvarial fracture.   IMPRESSION: No evidence of acute intracranial abnormality.   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   Atrophy with extensive small vessel ischemic changes and intracranial atherosclerosis. EXAM: PORTABLE CHEST - 1 VIEW   COMPARISON:  11/28/2013 and CT angio chest 11/27/2013 and earlier priors.   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 Stable chronic moderate cardiac enlargement. Heavy mitral annulus calcifications are noted. There is diffuse bilateral interstitial prominence, similar to recent chest radiographs of 11/15/2013 and 11/16/2013. Pulmonary vascularity appears slightly prominent. No consolidation. No visible pleural effusion. Negative for pneumothorax.   Right upper extremity PICC appears stable, projecting over the mid SVC.   IMPRESSION: Stable chronic moderate cardiomegaly. Diffuse interstitial prominence may reflect interstitial pulmonary edema.   ASSESSMENT/PLAN:  Atrial fibrillation with RVR - vital signs every shift x1 week; continue metoprolol, amiodarone and Coumadin Long-term use of anticoagulants - INR 2.2 - therapeutic; continue Coumadin 2 mg by mouth q. Monday-Wednesday-Friday and Coumadin 4 mg by mouth q. 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

## 2013-12-12 ENCOUNTER — Observation Stay (HOSPITAL_COMMUNITY)
Admission: EM | Admit: 2013-12-12 | Discharge: 2013-12-17 | Disposition: A | Discharge status: Skilled Nursing Facility | Payer: Medicare Other | Attending: Internal Medicine | Admitting: Internal Medicine

## 2013-12-12 ENCOUNTER — Emergency Department (HOSPITAL_COMMUNITY): Payer: Medicare Other

## 2013-12-12 ENCOUNTER — Encounter (HOSPITAL_COMMUNITY): Payer: Self-pay | Admitting: Emergency Medicine

## 2013-12-12 DIAGNOSIS — T827XXA Infection and inflammatory reaction due to other cardiac and vascular devices, implants and grafts, initial encounter: Secondary | ICD-10-CM | POA: Diagnosis not present

## 2013-12-12 DIAGNOSIS — K219 Gastro-esophageal reflux disease without esophagitis: Secondary | ICD-10-CM

## 2013-12-12 DIAGNOSIS — Z79899 Other long term (current) drug therapy: Secondary | ICD-10-CM | POA: Diagnosis not present

## 2013-12-12 DIAGNOSIS — I129 Hypertensive chronic kidney disease with stage 1 through stage 4 chronic kidney disease, or unspecified chronic kidney disease: Secondary | ICD-10-CM | POA: Insufficient documentation

## 2013-12-12 DIAGNOSIS — M069 Rheumatoid arthritis, unspecified: Secondary | ICD-10-CM | POA: Diagnosis not present

## 2013-12-12 DIAGNOSIS — I38 Endocarditis, valve unspecified: Secondary | ICD-10-CM | POA: Diagnosis not present

## 2013-12-12 DIAGNOSIS — Z792 Long term (current) use of antibiotics: Secondary | ICD-10-CM | POA: Diagnosis not present

## 2013-12-12 DIAGNOSIS — A0472 Enterocolitis due to Clostridium difficile, not specified as recurrent: Secondary | ICD-10-CM

## 2013-12-12 DIAGNOSIS — I2789 Other specified pulmonary heart diseases: Secondary | ICD-10-CM | POA: Diagnosis not present

## 2013-12-12 DIAGNOSIS — Z952 Presence of prosthetic heart valve: Secondary | ICD-10-CM | POA: Insufficient documentation

## 2013-12-12 DIAGNOSIS — I272 Pulmonary hypertension, unspecified: Secondary | ICD-10-CM

## 2013-12-12 DIAGNOSIS — E46 Unspecified protein-calorie malnutrition: Secondary | ICD-10-CM | POA: Diagnosis not present

## 2013-12-12 DIAGNOSIS — Y834 Other reconstructive surgery as the cause of abnormal reaction of the patient, or of later complication, without mention of misadventure at the time of the procedure: Secondary | ICD-10-CM | POA: Diagnosis not present

## 2013-12-12 DIAGNOSIS — E785 Hyperlipidemia, unspecified: Secondary | ICD-10-CM | POA: Insufficient documentation

## 2013-12-12 DIAGNOSIS — I4891 Unspecified atrial fibrillation: Secondary | ICD-10-CM | POA: Diagnosis not present

## 2013-12-12 DIAGNOSIS — I5023 Acute on chronic systolic (congestive) heart failure: Secondary | ICD-10-CM | POA: Diagnosis not present

## 2013-12-12 DIAGNOSIS — E875 Hyperkalemia: Secondary | ICD-10-CM | POA: Diagnosis not present

## 2013-12-12 DIAGNOSIS — Z66 Do not resuscitate: Secondary | ICD-10-CM | POA: Diagnosis not present

## 2013-12-12 DIAGNOSIS — Z8619 Personal history of other infectious and parasitic diseases: Secondary | ICD-10-CM

## 2013-12-12 DIAGNOSIS — Z7901 Long term (current) use of anticoagulants: Secondary | ICD-10-CM

## 2013-12-12 DIAGNOSIS — E7800 Pure hypercholesterolemia, unspecified: Secondary | ICD-10-CM | POA: Diagnosis not present

## 2013-12-12 DIAGNOSIS — Z951 Presence of aortocoronary bypass graft: Secondary | ICD-10-CM | POA: Diagnosis not present

## 2013-12-12 DIAGNOSIS — I5032 Chronic diastolic (congestive) heart failure: Secondary | ICD-10-CM

## 2013-12-12 DIAGNOSIS — N183 Chronic kidney disease, stage 3 unspecified: Secondary | ICD-10-CM

## 2013-12-12 DIAGNOSIS — I2510 Atherosclerotic heart disease of native coronary artery without angina pectoris: Secondary | ICD-10-CM | POA: Insufficient documentation

## 2013-12-12 DIAGNOSIS — J9611 Chronic respiratory failure with hypoxia: Secondary | ICD-10-CM

## 2013-12-12 DIAGNOSIS — IMO0002 Reserved for concepts with insufficient information to code with codable children: Secondary | ICD-10-CM

## 2013-12-12 DIAGNOSIS — Z9981 Dependence on supplemental oxygen: Secondary | ICD-10-CM | POA: Insufficient documentation

## 2013-12-12 DIAGNOSIS — R0902 Hypoxemia: Secondary | ICD-10-CM

## 2013-12-12 DIAGNOSIS — M329 Systemic lupus erythematosus, unspecified: Secondary | ICD-10-CM | POA: Insufficient documentation

## 2013-12-12 DIAGNOSIS — J8410 Pulmonary fibrosis, unspecified: Secondary | ICD-10-CM

## 2013-12-12 DIAGNOSIS — R0602 Shortness of breath: Secondary | ICD-10-CM | POA: Diagnosis present

## 2013-12-12 DIAGNOSIS — J9620 Acute and chronic respiratory failure, unspecified whether with hypoxia or hypercapnia: Secondary | ICD-10-CM | POA: Diagnosis not present

## 2013-12-12 DIAGNOSIS — J9621 Acute and chronic respiratory failure with hypoxia: Secondary | ICD-10-CM

## 2013-12-12 DIAGNOSIS — N179 Acute kidney failure, unspecified: Secondary | ICD-10-CM | POA: Insufficient documentation

## 2013-12-12 DIAGNOSIS — I5022 Chronic systolic (congestive) heart failure: Secondary | ICD-10-CM

## 2013-12-12 DIAGNOSIS — E43 Unspecified severe protein-calorie malnutrition: Secondary | ICD-10-CM

## 2013-12-12 DIAGNOSIS — R627 Adult failure to thrive: Secondary | ICD-10-CM

## 2013-12-12 DIAGNOSIS — Z8673 Personal history of transient ischemic attack (TIA), and cerebral infarction without residual deficits: Secondary | ICD-10-CM | POA: Insufficient documentation

## 2013-12-12 LAB — CBC
HCT: 40.3 % (ref 36.0–46.0)
HEMOGLOBIN: 12.9 g/dL (ref 12.0–15.0)
MCH: 30.3 pg (ref 26.0–34.0)
MCHC: 32 g/dL (ref 30.0–36.0)
MCV: 94.6 fL (ref 78.0–100.0)
Platelets: 76 10*3/uL — ABNORMAL LOW (ref 150–400)
RBC: 4.26 MIL/uL (ref 3.87–5.11)
RDW: 15.4 % (ref 11.5–15.5)
WBC: 6.1 10*3/uL (ref 4.0–10.5)

## 2013-12-12 LAB — BASIC METABOLIC PANEL
ANION GAP: 16 — AB (ref 5–15)
BUN: 21 mg/dL (ref 6–23)
CO2: 19 mEq/L (ref 19–32)
Calcium: 9 mg/dL (ref 8.4–10.5)
Chloride: 102 mEq/L (ref 96–112)
Creatinine, Ser: 1.63 mg/dL — ABNORMAL HIGH (ref 0.50–1.10)
GFR calc Af Amer: 34 mL/min — ABNORMAL LOW (ref >90)
GFR, EST NON AFRICAN AMERICAN: 30 mL/min — AB (ref >90)
Glucose, Bld: 88 mg/dL (ref 70–99)
POTASSIUM: 5.7 meq/L — AB (ref 3.7–5.3)
SODIUM: 137 meq/L (ref 137–147)

## 2013-12-12 LAB — I-STAT TROPONIN, ED: TROPONIN I, POC: 0 ng/mL (ref 0.00–0.08)

## 2013-12-12 LAB — PROTIME-INR
INR: 2.52 — AB (ref 0.00–1.49)
Prothrombin Time: 27.2 seconds — ABNORMAL HIGH (ref 11.6–15.2)

## 2013-12-12 LAB — PRO B NATRIURETIC PEPTIDE: Pro B Natriuretic peptide (BNP): 13086 pg/mL — ABNORMAL HIGH (ref 0–450)

## 2013-12-12 MED ORDER — AMIODARONE HCL 200 MG PO TABS
200.0000 mg | ORAL_TABLET | Freq: Every day | ORAL | Status: DC
  Administered 2013-12-13 – 2013-12-15 (×3): 200 mg via ORAL
  Filled 2013-12-12 (×3): qty 1

## 2013-12-12 MED ORDER — AMOXICILLIN 500 MG PO CAPS
500.0000 mg | ORAL_CAPSULE | Freq: Two times a day (BID) | ORAL | Status: DC
  Administered 2013-12-13 – 2013-12-17 (×10): 500 mg via ORAL
  Filled 2013-12-12 (×11): qty 1

## 2013-12-12 MED ORDER — VANCOMYCIN 50 MG/ML ORAL SOLUTION
125.0000 mg | Freq: Two times a day (BID) | ORAL | Status: DC
  Administered 2013-12-13 – 2013-12-15 (×6): 125 mg via ORAL
  Filled 2013-12-12 (×7): qty 2.5

## 2013-12-12 MED ORDER — GABAPENTIN 300 MG PO CAPS
300.0000 mg | ORAL_CAPSULE | Freq: Every day | ORAL | Status: DC
  Administered 2013-12-13 – 2013-12-14 (×3): 300 mg via ORAL
  Filled 2013-12-12 (×5): qty 1

## 2013-12-12 MED ORDER — HYDROXYCHLOROQUINE SULFATE 200 MG PO TABS
200.0000 mg | ORAL_TABLET | Freq: Two times a day (BID) | ORAL | Status: DC
  Administered 2013-12-13 – 2013-12-17 (×10): 200 mg via ORAL
  Filled 2013-12-12 (×11): qty 1

## 2013-12-12 MED ORDER — METOPROLOL TARTRATE 12.5 MG HALF TABLET
12.5000 mg | ORAL_TABLET | Freq: Two times a day (BID) | ORAL | Status: DC
Start: 2013-12-13 — End: 2013-12-17
  Administered 2013-12-13 – 2013-12-17 (×10): 12.5 mg via ORAL
  Filled 2013-12-12 (×11): qty 1

## 2013-12-12 MED ORDER — ACETAMINOPHEN 325 MG PO TABS: 650.0000 mg | ORAL_TABLET | Freq: Four times a day (QID) | ORAL | Status: DC | PRN

## 2013-12-12 MED ORDER — FAMOTIDINE 20 MG PO TABS
20.0000 mg | ORAL_TABLET | Freq: Two times a day (BID) | ORAL | Status: DC
  Administered 2013-12-13 – 2013-12-15 (×6): 20 mg via ORAL
  Filled 2013-12-12 (×7): qty 1

## 2013-12-12 MED ORDER — SACCHAROMYCES BOULARDII 250 MG PO CAPS
250.0000 mg | ORAL_CAPSULE | Freq: Two times a day (BID) | ORAL | Status: DC
  Administered 2013-12-13 – 2013-12-17 (×10): 250 mg via ORAL
  Filled 2013-12-12 (×12): qty 1

## 2013-12-12 MED ORDER — FUROSEMIDE 40 MG PO TABS
60.0000 mg | ORAL_TABLET | Freq: Every day | ORAL | Status: DC
  Administered 2013-12-13 – 2013-12-14 (×2): 60 mg via ORAL
  Filled 2013-12-12 (×2): qty 1

## 2013-12-12 MED ORDER — FUROSEMIDE 10 MG/ML IJ SOLN
80.0000 mg | Freq: Once | INTRAMUSCULAR | Status: AC
  Administered 2013-12-12: 80 mg via INTRAVENOUS
  Filled 2013-12-12: qty 8

## 2013-12-12 MED ORDER — FERROUS SULFATE 325 (65 FE) MG PO TABS
325.0000 mg | ORAL_TABLET | Freq: Two times a day (BID) | ORAL | Status: DC
  Administered 2013-12-13 – 2013-12-17 (×9): 325 mg via ORAL
  Filled 2013-12-12 (×12): qty 1

## 2013-12-12 NOTE — ED Notes (Signed)
Dr. Terrence Dupont, Dr. Tommy Medal, and Dr. Johnnye Sima are all familiar with the patient and her condition.  Family requests that staff follow up with these providers. Will inform MD.

## 2013-12-12 NOTE — ED Notes (Signed)
Updated patient on admission as plan of care, will continue to monitor BP readings as well.

## 2013-12-12 NOTE — ED Notes (Signed)
Dr. Terrence Dupont is private cardiologist, across the street.  The other two doctors are infectios disease doctors.

## 2013-12-12 NOTE — ED Provider Notes (Signed)
CSN: [id number]     Arrival date & time 12/12/13  1924 History   First MD Initiated Contact with Patient 12/12/13 1938     Chief Complaint  Patient presents with  . Shortness of Breath     HPI Presets from NH for eval of SOB. PMHS for PF, dCHF, severe pHTN, HTN.  Pt says she does not feel dyspneic currently but has felt so more a long time and felt a little SOB after playing with granddaughter today.  She says she has just been feeling unwell since discharge from this hospital for af ib with RVR, CHF exacerbation.  Currently being treated for c diff colitis with PO vanc.  Pt denies CP, pleurisy, hemoptysis, new LE edema, cough, fevers, chills, abd pain, n/v/d, dysuria, hematuria. Wears 4L O2 at baseline, when EMS arrived she was tachypneic with O2 sat 89% on 4L, improved after increasing to 5L [place].  Took 60mg  of Lasix today,   Past Medical History  Diagnosis Date  . Coronary artery disease   . Atrial fibrillation     Now NSR  . GERD (gastroesophageal reflux disease)   . Hyperlipidemia   . 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 Anemia   . Angina   . Hypertension   . Blood transfusion     "related to bad case of bronchitis once; 1 yr ago given due to GIB" (12/17/2012)  . H/O hiatal hernia   . Lupus     "that's compromised her lungs somewhat" (12/17/2012)  . Insomnia   . Endocarditis   . Prosthetic valve endocarditis   . Enterococcal bacteremia   . Heart murmur   . CHF (congestive heart failure)   . Pneumonia     "once or twice" (12/17/2012)  . Chronic bronchitis     "used to get it once/yr; hasn't had it since ~ 1986" (12/17/2012)  . Shortness of breath     "all the times sometimes" (12/17/2012)  . On home oxygen therapy     "2L prn; always at night" (12/17/2012)  . Lower GI bleed ~ 01/2012  . Arthritis     "hands" (12/17/2012)  . 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 Right bundle branch block 11/07/2013   Past Surgical History   Procedure Laterality Date  . Givens capsule study  04/09/2011    Procedure: GIVENS CAPSULE STUDY;  Surgeon: Beryle Beams, MD;  Location: [place];  Service: Endoscopy;  Laterality: N/A;  . Tee without cardioversion  02/06/2012    Procedure: TRANSESOPHAGEAL ECHOCARDIOGRAM (TEE);  Surgeon: Birdie Riddle, MD;  Location: [facility] ENDOSCOPY;  Service: Cardiovascular;  Laterality: N/A;  . Esophagogastroduodenoscopy  02/12/2012    Procedure: ESOPHAGOGASTRODUODENOSCOPY (EGD);  Surgeon: Beryle Beams, MD;  Location: [facility] ENDOSCOPY;  Service: Endoscopy;  Laterality: N/A;  . Tee without cardioversion N/A 07/15/2012    Procedure: TRANSESOPHAGEAL ECHOCARDIOGRAM (TEE);  Surgeon: Birdie Riddle, MD;  Location: [facility] ENDOSCOPY;  Service: Cardiovascular;  Laterality: N/A;  . Breast lumpectomy Left 1977    "benign" (12/17/2012)  . Tonsillectomy  1940's  . Coronary artery bypass graft  2006  . 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   . Cardiac catheterization      "probably 2-3" (12/17/2012)  . Coronary angioplasty with stent placement      "several put in over the years" (12/17/2012)  . Cataract extraction w/ intraocular lens  implant, bilateral  Bilateral 11/2000  . Peripherally inserted central catheter insertion Right 12/15/2012    "upper arm" (12/17/2012   Family History  Problem Relation Age of Onset  . Heart disease Mother     CHF  . Mental illness Father     suicide  . Cancer Sister     pancreatic cancer  . COPD Brother    History  Substance Use Topics  . Smoking status: Never Smoker   . Smokeless tobacco: Never Used  . Alcohol Use: No   OB History   Grav Para Term Preterm Abortions TAB SAB Ect Mult Living                 Review of Systems  Constitutional: Negative for fever and chills.  Respiratory: Positive for cough and shortness of breath. Negative for wheezing.   Cardiovascular: Positive for leg swelling (chronic, improved). Negative for chest pain.   Gastrointestinal: Negative for nausea, vomiting, abdominal pain and diarrhea.  Musculoskeletal: Negative for back pain.  Skin: Negative for rash.  All other systems reviewed and are negative.   Allergies  Ceftriaxone and Codeine  Home Medications   Prior to Admission medications   Medication Sig Start Date End Date Taking? Authorizing Provider  acetaminophen (TYLENOL) 325 MG tablet Take 2 tablets (650 mg total) by mouth every 6 (six) hours as needed for mild pain (or Fever >/= 101). 12/07/13   Kelvin Cellar, MD  amiodarone (PACERONE) 200 MG tablet Take 1 tablet (200 mg total) by mouth daily. 11/13/13   Hosie Poisson, MD  amoxicillin (AMOXIL) 500 MG capsule Take 1 capsule (500 mg total) by mouth every 12 (twelve) hours. 09/15/13   Barton Dubois, MD  famotidine (PEPCID) 20 MG tablet Take 1 tablet (20 mg total) by mouth 2 (two) times daily. 10/27/13   Estill Dooms, MD  feeding supplement, ENSURE COMPLETE, (ENSURE COMPLETE) LIQD Take 237 mLs by mouth 3 (three) times daily between meals. 11/19/13   Kelvin Cellar, MD  feeding supplement, ENSURE, (ENSURE) PUDG Take 1 Container by mouth 2 (two) times daily after a meal. 12/07/13   Kelvin Cellar, MD  Ferrous Fumarate (IRON) 18 MG TBCR Take 18 mg by mouth 2 (two) times daily.    Historical Provider, MD  furosemide (LASIX) 20 MG tablet Take 3 tablets (60 mg total) by mouth daily. 12/07/13   Kelvin Cellar, MD  gabapentin (NEURONTIN) 300 MG capsule Take 1 capsule (300 mg total) by mouth at bedtime. 10/27/13   Estill Dooms, MD  hydroxychloroquine (PLAQUENIL) 200 MG tablet Take 200 mg by mouth 2 (two) times daily.     Historical Provider, MD  metoprolol tartrate (LOPRESSOR) 12.5 mg TABS tablet Take 0.5 tablets (12.5 mg total) by mouth 2 (two) times daily. 12/07/13   Kelvin Cellar, MD  potassium chloride (KLOR-CON M15) 15 MEQ tablet Take 2 tablets (30 mEq total) by mouth 2 (two) times daily. 11/19/13   Kelvin Cellar, MD  saccharomyces boulardii  (FLORASTOR) 250 MG capsule Take 1 capsule (250 mg total) by mouth 2 (two) times daily. 08/31/13   Tiffany L Reed, DO  vancomycin (VANCOCIN) 50 mg/mL oral solution Take 125 mg PO BID for 7 days, followed by 125 mg daily for 7 days followed by 125 mg every other day for 7 days 12/07/13   Kelvin Cellar, MD  warfarin (COUMADIN) 2 MG tablet Take 2 mg on Mon, Wed and Fri, starting on Wed. 4 mg PO on Tues, Thurs, Sat and Sunday. 12/07/13   Kelvin Cellar,  MD  warfarin (COUMADIN) 2 MG tablet Take 1 tablet (2 mg total) by mouth every Monday, Wednesday, and Friday at 6 PM. 12/09/13   Kelvin Cellar, MD  warfarin (COUMADIN) 4 MG tablet Take 1 tablet (4 mg total) by mouth every Tuesday, Thursday, Saturday, and Sunday at 6 PM. 12/08/13   Kelvin Cellar, MD   BP 121/76  Pulse 125  Temp(Src) 98.5 F (36.9 C) (Oral)  Resp 22  Ht 5\' 7"  (1.702 m)  Wt 190 lb 7 oz (86.382 kg)  BMI 29.82 kg/m2  SpO2 98% Physical Exam  Nursing note and vitals reviewed. Constitutional: She is oriented to person, place, and time. She appears well-developed and well-nourished. No distress.  HENT:  Head: Normocephalic and atraumatic.  Nose: Nose normal.  Eyes: Conjunctivae are normal.  No conj pallor  Neck: Normal range of motion. Neck supple. No JVD present. No tracheal deviation present.  Cardiovascular: Normal heart sounds.   No murmur heard. Tachy 110, irregular  Pulmonary/Chest: Effort normal and breath sounds normal. No respiratory distress. She has no wheezes. She has no rales. She exhibits no tenderness.  Abdominal: Soft. Bowel sounds are normal. She exhibits no distension and no mass. There is no tenderness.  Musculoskeletal: Normal range of motion. She exhibits edema (trace bilateral). She exhibits no tenderness.  Neurological: She is alert and oriented to person, place, and time.  Normal strength and sensation  Skin: Skin is warm and dry. No rash noted.  Psychiatric: She has a normal mood and affect.    ED Course   Procedures (including critical care time) Labs Review Labs Reviewed  CBC - Abnormal; Notable for the following:    Platelets 76 (*)    All other components within normal limits  PRO B NATRIURETIC PEPTIDE - Abnormal; Notable for the following:    Pro B Natriuretic peptide (BNP) 13086.0 (*)    All other components within normal limits  BASIC METABOLIC PANEL - Abnormal; Notable for the following:    Potassium 5.7 (*)    Creatinine, Ser 1.63 (*)    GFR calc non Af Amer 30 (*)    GFR calc Af Amer 34 (*)    Anion gap 16 (*)    All other components within normal limits  PROTIME-INR - Abnormal; Notable for the following:    Prothrombin Time 27.2 (*)    INR 2.52 (*)    All other components within normal limits  URINALYSIS, ROUTINE W REFLEX MICROSCOPIC  I-STAT TROPOININ, ED    Imaging Review Dg Chest 2 View  12/12/2013   CLINICAL DATA:  Short of breath.  EXAM: CHEST  2 VIEW  COMPARISON:  12/04/2013.  FINDINGS: Stable changes from previous cardiac surgery. Cardiac silhouette is mildly enlarged. No mediastinal or hilar masses.  Mild chronic interstitial prominence. No lung consolidation or edema. No pleural effusion or pneumothorax.  Bony thorax is demineralized but grossly intact.  IMPRESSION: No acute cardiopulmonary disease.   Electronically Signed   By: Lajean Manes M.D.   On: 12/12/2013 21:13     EKG Interpretation   Date/Time:  Saturday December 12 2013 19:33:40 EDT Ventricular Rate:  110 PR Interval:    QRS Duration: 110 QT Interval:  360 QTC Calculation: 487 R Axis:   152 Text Interpretation:  Atrial fibrillation Low voltage, precordial leads  Probable right ventricular hypertrophy Borderline prolonged QT interval  since last tracing no significant change Confirmed by BELFI  MD, MELANIE  ([id number]) on 12/12/2013 8:02:00 PM  MDM   Final diagnoses:  Atrial fibrillation with RVR  Chronic diastolic congestive heart failure  Hypoxia   Chronically ill pt presents with  tachypnea and feeling of unwellness for past few days.  Hypoxic on EMS arrival to 88-89% at baseline 4L [place].  Not in distress.  Etiology not entirely clear. No evidence of PNA. Anticoagulated, doubt PE. No signs of DVT.  In afib with rate 110, deferred AV blocking agents at this time.  BP stable.  BNP elevated from baseline but CXR without fulminant edema.  EKG without changes compared to prior, trop neg.  Creat appears to be at prior levels.   DNR  Admit to hospitalist    Tammy Sours, MD 12/13/13 [phone]

## 2013-12-12 NOTE — ED Notes (Signed)
Attempted to call report. Discussed this is second attempt.

## 2013-12-12 NOTE — ED Notes (Addendum)
Discussed with Dr. Addison Bailey the family's concern for updating outpatient doctors for the family. Reviewed labs, BNP 13000, K 5.7, non hemolyzed.

## 2013-12-12 NOTE — ED Notes (Addendum)
Pt reports to the ED via [facility] for eval of SOB. Pt is from [facility]. She wears 4 L of O2 at all times. Per EMS: Upon [facility] arrival pt was 89% on 4L . Upon EMS arrival they increased her O2 to 5 L and her O2 came up to 96%. Lung sounds clear. HR irregular. Upon arrival pt tachpnic and using pursed lip breathing. Pt has hx of C. Dif and currenly being treated with oral vancomycin. Denies any CP, urinary symptoms, HAs, or visual changes. HR 90s-110s. Pt A&Ox4. Skin warm and dry.

## 2013-12-12 NOTE — ED Notes (Signed)
Attempted to call report. Receiving nurse is Parke Simmers on 3E.

## 2013-12-13 DIAGNOSIS — I5023 Acute on chronic systolic (congestive) heart failure: Secondary | ICD-10-CM

## 2013-12-13 DIAGNOSIS — A0472 Enterocolitis due to Clostridium difficile, not specified as recurrent: Secondary | ICD-10-CM

## 2013-12-13 DIAGNOSIS — J9620 Acute and chronic respiratory failure, unspecified whether with hypoxia or hypercapnia: Secondary | ICD-10-CM | POA: Diagnosis not present

## 2013-12-13 DIAGNOSIS — R0902 Hypoxemia: Secondary | ICD-10-CM

## 2013-12-13 DIAGNOSIS — N183 Chronic kidney disease, stage 3 unspecified: Secondary | ICD-10-CM

## 2013-12-13 DIAGNOSIS — E875 Hyperkalemia: Secondary | ICD-10-CM

## 2013-12-13 DIAGNOSIS — I509 Heart failure, unspecified: Secondary | ICD-10-CM

## 2013-12-13 DIAGNOSIS — I4891 Unspecified atrial fibrillation: Secondary | ICD-10-CM

## 2013-12-13 DIAGNOSIS — I5032 Chronic diastolic (congestive) heart failure: Secondary | ICD-10-CM

## 2013-12-13 LAB — URINALYSIS, ROUTINE W REFLEX MICROSCOPIC
Bilirubin Urine: NEGATIVE
Glucose, UA: NEGATIVE mg/dL
Hgb urine dipstick: NEGATIVE
KETONES UR: NEGATIVE mg/dL
Nitrite: NEGATIVE
PH: 5.5 (ref 5.0–8.0)
Protein, ur: NEGATIVE mg/dL
Specific Gravity, Urine: 1.01 (ref 1.005–1.030)
Urobilinogen, UA: 0.2 mg/dL (ref 0.0–1.0)

## 2013-12-13 LAB — URINE MICROSCOPIC-ADD ON

## 2013-12-13 LAB — BASIC METABOLIC PANEL
ANION GAP: 13 (ref 5–15)
BUN: 24 mg/dL — ABNORMAL HIGH (ref 6–23)
CALCIUM: 8.9 mg/dL (ref 8.4–10.5)
CO2: 23 mEq/L (ref 19–32)
Chloride: 104 mEq/L (ref 96–112)
Creatinine, Ser: 1.88 mg/dL — ABNORMAL HIGH (ref 0.50–1.10)
GFR calc Af Amer: 29 mL/min — ABNORMAL LOW (ref >90)
GFR calc non Af Amer: 25 mL/min — ABNORMAL LOW (ref >90)
Glucose, Bld: 93 mg/dL (ref 70–99)
Potassium: 4.9 mEq/L (ref 3.7–5.3)
Sodium: 140 mEq/L (ref 137–147)

## 2013-12-13 LAB — PROTIME-INR
INR: 3.06 — ABNORMAL HIGH (ref 0.00–1.49)
Prothrombin Time: 31.6 seconds — ABNORMAL HIGH (ref 11.6–15.2)

## 2013-12-13 LAB — MRSA PCR SCREENING: MRSA by PCR: NEGATIVE

## 2013-12-13 MED ORDER — ONDANSETRON HCL 4 MG/2ML IJ SOLN: 4.0000 mg | Freq: Four times a day (QID) | INTRAMUSCULAR | Status: DC | PRN

## 2013-12-13 MED ORDER — ACETAMINOPHEN 325 MG PO TABS
650.0000 mg | ORAL_TABLET | ORAL | Status: DC | PRN
  Administered 2013-12-15: 650 mg via ORAL
  Filled 2013-12-13: qty 2

## 2013-12-13 MED ORDER — WARFARIN SODIUM 4 MG PO TABS: 4.0000 mg | ORAL_TABLET | ORAL | Status: DC

## 2013-12-13 MED ORDER — SODIUM CHLORIDE 0.9 % IV SOLN: 250.0000 mL | INTRAVENOUS | Status: DC | PRN

## 2013-12-13 MED ORDER — WARFARIN SODIUM 4 MG PO TABS
4.0000 mg | ORAL_TABLET | ORAL | Status: DC
  Filled 2013-12-13: qty 1

## 2013-12-13 MED ORDER — SODIUM CHLORIDE 0.9 % IJ SOLN: 3.0000 mL | INTRAMUSCULAR | Status: DC | PRN

## 2013-12-13 MED ORDER — SODIUM CHLORIDE 0.9 % IJ SOLN
3.0000 mL | Freq: Two times a day (BID) | INTRAMUSCULAR | Status: DC
  Administered 2013-12-13 – 2013-12-17 (×10): 3 mL via INTRAVENOUS

## 2013-12-13 MED ORDER — WARFARIN SODIUM 2 MG PO TABS
2.0000 mg | ORAL_TABLET | ORAL | Status: DC
  Filled 2013-12-13: qty 1

## 2013-12-13 MED ORDER — WARFARIN SODIUM 2 MG PO TABS
2.0000 mg | ORAL_TABLET | Freq: Once | ORAL | Status: AC
  Administered 2013-12-13: 2 mg via ORAL
  Filled 2013-12-13: qty 1

## 2013-12-13 MED ORDER — WARFARIN - PHARMACIST DOSING INPATIENT
Freq: Every day | Status: DC
  Administered 2013-12-13: 17:00:00

## 2013-12-13 NOTE — H&P (Signed)
[facility] History and Physical  [name] WCH:[id number] DOB: 15-Jun-1940 DOA: 12/12/2013  Referring physician: EDP PCP: Hollace Kinnier, DO   Chief Complaint: SOB   HPI: [name] is a 73 y.o. female well known to our service, this will be her 5th admission since beginning of Aug.  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  She is brought in to the ED by daughter after patient was noted to be more SOB than her baseline at the rehab.  Patient does have a baseline O2 requirement of 4L, however despite this she was desatting at the nursing home and was tachypnic.  Her pulse ox has improved with increase to her O2 in the ED.  The daughter does note that on Wed this week the Rehab had gotten her dose of lasix wrong and patient was edematous, but after correcting this patient was doing much better on Thursday.  She denies any new CP, abdominal pain, or pain anywhere for that matter, fever, chills, nausea, vomiting, her diarrhea that she has been having due to C.Diff has actually been improving she states and stools are more normal now thanks to ongoing treatment with PO vancomycin, also denies new LE edema, dysuria.  Review of Systems: Systems reviewed.  As above, otherwise negative  Past Medical History  Diagnosis Date  . Coronary artery disease   . Atrial fibrillation     Now NSR  . GERD (gastroesophageal reflux disease)   . Hyperlipidemia   . 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 Anemia   . Angina   . Hypertension   . Blood transfusion     "related to bad case of bronchitis once; 1 yr ago given due to GIB" (12/17/2012)  . H/O hiatal hernia   . Lupus     "that's compromised her lungs somewhat" (12/17/2012)  . Insomnia   . Endocarditis   . Prosthetic valve endocarditis   .  Enterococcal bacteremia   . Heart murmur   . CHF (congestive heart failure)   . Pneumonia     "once or twice" (12/17/2012)  . Chronic bronchitis     "used to get it once/yr; hasn't had it since ~ 1986" (12/17/2012)  . Shortness of breath     "all the times sometimes" (12/17/2012)  . On home oxygen therapy     "2L prn; always at night" (12/17/2012)  . Lower GI bleed ~ 01/2012  . Arthritis     "hands" (12/17/2012)  . 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 Right bundle branch block 11/07/2013   Past Surgical History  Procedure Laterality Date  . Givens capsule study  04/09/2011    Procedure: GIVENS CAPSULE STUDY;  Surgeon: Beryle Beams, MD;  Location: [place];  Service: Endoscopy;  Laterality: N/A;  . Tee without cardioversion  02/06/2012    Procedure: TRANSESOPHAGEAL ECHOCARDIOGRAM (TEE);  Surgeon: Birdie Riddle, MD;  Location: [facility] ENDOSCOPY;  Service: Cardiovascular;  Laterality: N/A;  . Esophagogastroduodenoscopy  02/12/2012    Procedure: ESOPHAGOGASTRODUODENOSCOPY (EGD);  Surgeon: Beryle Beams, MD;  Location: [facility] ENDOSCOPY;  Service: Endoscopy;  Laterality: N/A;  . Tee without cardioversion N/A 07/15/2012    Procedure: TRANSESOPHAGEAL ECHOCARDIOGRAM (TEE);  Surgeon: Birdie Riddle, MD;  Location: [place];  Service: Cardiovascular;  Laterality:  N/A;  . Breast lumpectomy Left 1977    "benign" (12/17/2012)  . Tonsillectomy  1940's  . Coronary artery bypass graft  2006  . 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   . Cardiac catheterization      "probably 2-3" (12/17/2012)  . Coronary angioplasty with stent placement      "several put in over the years" (12/17/2012)  . Cataract extraction w/ intraocular lens  implant, bilateral Bilateral 11/2000  . Peripherally inserted central catheter insertion Right 12/15/2012    "upper arm" (12/17/2012   Social History:  reports that she has never smoked. She has never used smokeless  tobacco. She reports that she does not drink alcohol or use illicit drugs.  Allergies  Allergen Reactions  . Ceftriaxone Itching and Rash    RASH BUT TOLERATED AMPICILLIN AND IMIPENEM WITHOUT PROBLEMS  . Codeine Nausea Only    Family History  Problem Relation Age of Onset  . Heart disease Mother     CHF  . Mental illness Father     suicide  . Cancer Sister     pancreatic cancer  . COPD Brother      Prior to Admission medications   Medication Sig Start Date End Date Taking? Authorizing Provider  acetaminophen (TYLENOL) 325 MG tablet Take 2 tablets (650 mg total) by mouth every 6 (six) hours as needed for mild pain (or Fever >/= 101). 12/07/13  Yes Kelvin Cellar, MD  amiodarone (PACERONE) 200 MG tablet Take 1 tablet (200 mg total) by mouth daily. 11/13/13  Yes Hosie Poisson, MD  amoxicillin (AMOXIL) 500 MG capsule Take 1 capsule (500 mg total) by mouth every 12 (twelve) hours. 09/15/13  Yes Barton Dubois, MD  famotidine (PEPCID) 20 MG tablet Take 1 tablet (20 mg total) by mouth 2 (two) times daily. 10/27/13  Yes Estill Dooms, MD  Ferrous Fumarate (IRON) 18 MG TBCR Take 18 mg by mouth 2 (two) times daily.   Yes Historical Provider, MD  furosemide (LASIX) 20 MG tablet Take 3 tablets (60 mg total) by mouth daily. 12/07/13  Yes Kelvin Cellar, MD  gabapentin (NEURONTIN) 300 MG capsule Take 1 capsule (300 mg total) by mouth at bedtime. 10/27/13  Yes Estill Dooms, MD  geriatric multivitamins-minerals (ELDERTONIC/GEVRABON) ELIX Take 15 mLs by mouth 2 (two) times daily.   Yes Historical Provider, MD  hydroxychloroquine (PLAQUENIL) 200 MG tablet Take 200 mg by mouth 2 (two) times daily.    Yes Historical Provider, MD  metoprolol tartrate (LOPRESSOR) 12.5 mg TABS tablet Take 0.5 tablets (12.5 mg total) by mouth 2 (two) times daily. 12/07/13  Yes Kelvin Cellar, MD  potassium chloride (K-DUR,KLOR-CON) 10 MEQ tablet Take 30 mEq by mouth 2 (two) times daily.   Yes Historical Provider, MD   saccharomyces boulardii (FLORASTOR) 250 MG capsule Take 1 capsule (250 mg total) by mouth 2 (two) times daily. 08/31/13  Yes Tiffany L Reed, DO  vancomycin (VANCOCIN) 50 mg/mL oral solution Take 125 mg PO BID for 7 days, followed by 125 mg daily for 7 days followed by 125 mg every other day for 7 days 12/07/13  Yes Kelvin Cellar, MD  warfarin (COUMADIN) 2 MG tablet Take 1 tablet (2 mg total) by mouth every Monday, Wednesday, and Friday at 6 PM. 12/09/13  Yes Kelvin Cellar, MD  warfarin (COUMADIN) 4 MG tablet Take 1 tablet (4 mg total) by mouth every Tuesday, Thursday, Saturday, and Sunday at 6 PM. 12/08/13  Yes Kelvin Cellar,  MD   Physical Exam: Filed Vitals:   12/12/13 2345  BP: 106/71  Pulse: 103  Temp:   Resp: 28    BP 106/71  Pulse 103  Temp(Src) 98.5 F (36.9 C) (Oral)  Resp 28  Ht 5\' 7"  (1.702 m)  Wt 86.382 kg (190 lb 7 oz)  BMI 29.82 kg/m2  SpO2 97%  General Appearance:    Alert, oriented, chronically ill appears stated age  Head:    Normocephalic, atraumatic  Eyes:    PERRL, EOMI, sclera non-icteric        Nose:   Nares without drainage or epistaxis. Mucosa, turbinates normal  Throat:   Moist mucous membranes. Oropharynx without erythema or exudate.  Neck:   Supple. No carotid bruits.  No thyromegaly.  No lymphadenopathy.   Back:     No CVA tenderness, no spinal tenderness  Lungs:     Coarse breath sounds B  Chest wall:    No tenderness to palpitation  Heart:    Regular rate and rhythm without murmurs, gallops, rubs  Abdomen:     Soft, non-tender, nondistended, normal bowel sounds, no organomegaly  Genitalia:    deferred  Rectal:    deferred  Extremities:   No clubbing, cyanosis or edema.  Pulses:   2+ and symmetric all extremities  Skin:   Skin color, texture, turgor normal, no rashes or lesions  Lymph nodes:   Cervical, supraclavicular, and axillary nodes normal  Neurologic:   CNII-XII intact. Normal strength, sensation and reflexes      throughout    Labs  on Admission:  Basic Metabolic Panel:  Recent Labs Lab 12/07/13 0341 12/12/13 1936  NA 139 137  K 3.0* 5.7*  CL 102 102  CO2 26 19  GLUCOSE 77 88  BUN 8 21  CREATININE 0.96 1.63*  CALCIUM 8.3* 9.0   Liver Function Tests: No results found for this basename: AST, ALT, ALKPHOS, BILITOT, PROT, ALBUMIN,  in the last 168 hours No results found for this basename: LIPASE, AMYLASE,  in the last 168 hours No results found for this basename: AMMONIA,  in the last 168 hours CBC:  Recent Labs Lab 12/12/13 1936  WBC 6.1  HGB 12.9  HCT 40.3  MCV 94.6  PLT 76*   Cardiac Enzymes: No results found for this basename: CKTOTAL, CKMB, CKMBINDEX, TROPONINI,  in the last 168 hours  BNP (last 3 results)  Recent Labs  11/27/13 2111 12/04/13 1400 12/12/13 1946  PROBNP 7774.0* 6091.0* 13086.0*   CBG: No results found for this basename: GLUCAP,  in the last 168 hours  Radiological Exams on Admission: Dg Chest 2 View  12/12/2013   CLINICAL DATA:  Short of breath.  EXAM: CHEST  2 VIEW  COMPARISON:  12/04/2013.  FINDINGS: Stable changes from previous cardiac surgery. Cardiac silhouette is mildly enlarged. No mediastinal or hilar masses.  Mild chronic interstitial prominence. No lung consolidation or edema. No pleural effusion or pneumothorax.  Bony thorax is demineralized but grossly intact.  IMPRESSION: No acute cardiopulmonary disease.   Electronically Signed   By: Lajean Manes M.D.   On: 12/12/2013 21:13    EKG: Independently reviewed.  Assessment/Plan Principal Problem:   Acute on chronic respiratory failure with hypoxemia Active Problems:   CKD (chronic kidney disease) stage 3, GFR 30-59 ml/min   Acute on chronic systolic heart failure   Colitis due to Clostridium difficile   Hypoxia   Hyperkalemia   1. Acute on chronic respiratory failure with hypoxia -  1. Hypoxia corrected with increased O2 via [place] 2. Suspect Acute on chronic systolic CHF as the cause given elevation in BNP  today from her baseline 3. Lasix 80mg  IV given in ED 4. On heart failure pathway 5. Continue home lasix dosing 6. Monitor intake and output 7. UA pending 8. No SIRS criteria or CXR findings to suggest infectious PNA 2. CKD stage 3 - at baseline with creat 1.6 today 3. Colitis due to C.Diff - continue po vanc 4. Chronic endocarditis - continue amoxicillin 5. Hyperkalemia - hold potassium suplementation, giving lasix, recheck BMP in AM.   Code Status: DNR  Family Communication: Daughter at bedside Disposition Plan: Admit to obs   Time spent: 70 min  Tacy Chavis M. [facility] Pager [phone]  If 7AM-7PM, please contact the day team taking care of the patient [email or web address] Password [email or web address] 12/13/2013, 12:01 AM

## 2013-12-13 NOTE — Progress Notes (Signed)
Utilization Review Completed.   Parker Wherley, RN, BSN Nurse Case Manager  

## 2013-12-13 NOTE — Progress Notes (Signed)
ANTICOAGULATION CONSULT NOTE - Initial Consult  Pharmacy Consult for Warfarin  Indication: atrial fibrillation, AVR  Allergies  Allergen Reactions  . Ceftriaxone Itching and Rash    RASH BUT TOLERATED AMPICILLIN AND IMIPENEM WITHOUT PROBLEMS  . Codeine Nausea Only    Patient Measurements: Height: 5\' 7"  (170.2 cm) Weight: 171 lb 11.8 oz (77.9 kg) (bed) IBW/kg (Calculated) : 61.6  Vital Signs: Temp: 97.7 F (36.5 C) (09/20 1100) Temp src: Oral (09/20 1100) BP: 106/58 mmHg (09/20 1100) Pulse Rate: 71 (09/20 1100)  Labs:  Recent Labs  12/12/13 1936 12/13/13 0545  HGB 12.9  --   HCT 40.3  --   PLT 76*  --   LABPROT 27.2* 31.6*  INR 2.52* 3.06*  CREATININE 1.63* 1.88*    Estimated Creatinine Clearance: 27.8 ml/min (by C-G formula based on Cr of 1.88).   Medications:    Assessment: 73 y/o F here with shortness of breath, warfarin PTA for afib/AVR, INR therapeutic on admit at 2.52, but increased to 3.06 this morning. She is on home dose amiodarone. Chronic thrombocytopenia, plts low 76 slightly lower than previous,   PTA dose: Warfarin 4 mg TRSS, 2mg  MWF  Goal of Therapy:  INR 2-3 Monitor platelets by anticoagulation protocol: Yes   Plan:  -Warfarin 2mg  today, then continue home regimen from tomorrow. -Daily PT/INR -Adjust dose as needed  Maryanna Shape, PharmD, BCPS  Clinical Pharmacist  Pager: [phone]   12/13/2013,1:12 PM

## 2013-12-13 NOTE — Progress Notes (Signed)
ANTICOAGULATION CONSULT NOTE - Initial Consult  Pharmacy Consult for Warfarin  Indication: atrial fibrillation, AVR  Allergies  Allergen Reactions  . Ceftriaxone Itching and Rash    RASH BUT TOLERATED AMPICILLIN AND IMIPENEM WITHOUT PROBLEMS  . Codeine Nausea Only    Patient Measurements: Height: 5\' 7"  (170.2 cm) Weight: 190 lb 7 oz (86.382 kg) IBW/kg (Calculated) : 61.6  Vital Signs: Temp: 98.5 F (36.9 C) (09/19 1938) Temp src: Oral (09/19 1938) BP: 106/71 mmHg (09/19 2345) Pulse Rate: 103 (09/19 2345)  Labs:  Recent Labs  12/12/13 1936  HGB 12.9  HCT 40.3  PLT 76*  LABPROT 27.2*  INR 2.52*  CREATININE 1.63*    Estimated Creatinine Clearance: 33.7 ml/min (by C-G formula based on Cr of 1.63).   Medical History: Past Medical History  Diagnosis Date  . Coronary artery disease   . Atrial fibrillation     Now NSR  . GERD (gastroesophageal reflux disease)   . Hyperlipidemia   . 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 Anemia   . Angina   . Hypertension   . Blood transfusion     "related to bad case of bronchitis once; 1 yr ago given due to GIB" (12/17/2012)  . H/O hiatal hernia   . Lupus     "that's compromised her lungs somewhat" (12/17/2012)  . Insomnia   . Endocarditis   . Prosthetic valve endocarditis   . Enterococcal bacteremia   . Heart murmur   . CHF (congestive heart failure)   . Pneumonia     "once or twice" (12/17/2012)  . Chronic bronchitis     "used to get it once/yr; hasn't had it since ~ 1986" (12/17/2012)  . Shortness of breath     "all the times sometimes" (12/17/2012)  . On home oxygen therapy     "2L prn; always at night" (12/17/2012)  . Lower GI bleed ~ 01/2012  . Arthritis     "hands" (12/17/2012)  . 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 Right bundle branch block 11/07/2013    Medications:  Warfarin 4 mg TRSS, 2mg  MWF  Assessment: 73 y/o F here with shortness of breath, warfarin  PTA for afib/AVR, INR therapeutic on admit at 2.52, plts low at 76, other labs as above.    Goal of Therapy:  INR 2-3 Monitor platelets by anticoagulation protocol: Yes   Plan:  -Warfarin per PTA regimen -Daily PT/INR -Adjust dose as needed  Narda Bonds 12/13/2013,12:01 AM

## 2013-12-13 NOTE — ED Notes (Signed)
Family at the bedside requesting medication for restless leg syndrome, gabapentin.

## 2013-12-13 NOTE — Progress Notes (Signed)
[facility] PROGRESS NOTE  [name]:[id number] DOB: 1940/10/17 DOA: 12/12/2013 PCP: Hollace Kinnier, DO  Assessment/Plan: 1. Acute on chronic respiratory failure with hypoxia  1. Hypoxia improved with O2 2. BNP elevated from her baseline 3. No pulm edema on CXR 4. Pt noted to have severe pulm HTN on recent 2d echo 5. Will continue home lasix dosing 6. Cr now trending up. Monitor closely 2. CKD stage 3 - trending upwards slightly. Cont diuretics per above for now 3. Colitis due to C.Diff - continue po vanc, pt still with diarrhea 4. Chronic endocarditis - continue amoxicillin 5. Hyperkalemia - held potassium replacement. Resolved  Code Status: DNR Family Communication: Pt and family at bedside (indicate person spoken with, relationship, and if by phone, the number) Disposition Plan: Pending   Consultants:    Procedures:    Antibiotics:  PO Vanc  Amoxicillin  HPI/Subjective: Still sob, no acute events noted overnight  Objective: Filed Vitals:   12/13/13 0040 12/13/13 0644 12/13/13 1100 12/13/13 1438  BP: 105/64 109/70 106/58 104/63  Pulse: 85 84 71 95  Temp: 97.7 F (36.5 C) 97.4 F (36.3 C) 97.7 F (36.5 C) 97.4 F (36.3 C)  TempSrc: Oral Oral Oral Oral  Resp: 20 20 16    Height:      Weight: 77.9 kg (171 lb 11.8 oz)     SpO2: 100% 100% 93% 100%    Intake/Output Summary (Last 24 hours) at 12/13/13 1911 Last data filed at 12/13/13 1105  Gross per 24 hour  Intake    599 ml  Output    500 ml  Net     99 ml   Filed Weights   12/12/13 1938 12/13/13 0040  Weight: 86.382 kg (190 lb 7 oz) 77.9 kg (171 lb 11.8 oz)    Exam:   General:  Awake, in nad  Cardiovascular: regular, s1, s2  Respiratory: normal resp effort, no wheezing  Abdomen: soft,nondistended  Musculoskeletal: perfused, no clubbing   Data Reviewed: Basic Metabolic Panel:  Recent Labs Lab 12/07/13 0341 12/12/13 1936 12/13/13 0545  NA 139 137 140  K 3.0* 5.7* 4.9   CL 102 102 104  CO2 26 19 23   GLUCOSE 77 88 93  BUN 8 21 24*  CREATININE 0.96 1.63* 1.88*  CALCIUM 8.3* 9.0 8.9   Liver Function Tests: No results found for this basename: AST, ALT, ALKPHOS, BILITOT, PROT, ALBUMIN,  in the last 168 hours No results found for this basename: LIPASE, AMYLASE,  in the last 168 hours No results found for this basename: AMMONIA,  in the last 168 hours CBC:  Recent Labs Lab 12/12/13 1936  WBC 6.1  HGB 12.9  HCT 40.3  MCV 94.6  PLT 76*   Cardiac Enzymes: No results found for this basename: CKTOTAL, CKMB, CKMBINDEX, TROPONINI,  in the last 168 hours BNP (last 3 results)  Recent Labs  11/27/13 2111 12/04/13 1400 12/12/13 1946  PROBNP 7774.0* 6091.0* 13086.0*   CBG: No results found for this basename: GLUCAP,  in the last 168 hours  Recent Results (from the past 240 hour(s))  MRSA PCR SCREENING     Status: None   Collection Time    12/13/13  8:00 AM      Result Value Ref Range Status   MRSA by PCR NEGATIVE  NEGATIVE Final   Comment:            The GeneXpert MRSA Assay (FDA     approved for NASAL specimens  only), is one component of a     comprehensive MRSA colonization     surveillance program. It is not     intended to diagnose MRSA     infection nor to guide or     monitor treatment for     MRSA infections.     Studies: Dg Chest 2 View  12/12/2013   CLINICAL DATA:  Short of breath.  EXAM: CHEST  2 VIEW  COMPARISON:  12/04/2013.  FINDINGS: Stable changes from previous cardiac surgery. Cardiac silhouette is mildly enlarged. No mediastinal or hilar masses.  Mild chronic interstitial prominence. No lung consolidation or edema. No pleural effusion or pneumothorax.  Bony thorax is demineralized but grossly intact.  IMPRESSION: No acute cardiopulmonary disease.   Electronically Signed   By: Lajean Manes M.D.   On: 12/12/2013 21:13    Scheduled Meds: . amiodarone  200 mg Oral Daily  . amoxicillin  500 mg Oral Q12H  . famotidine  20  mg Oral BID  . ferrous sulfate  325 mg Oral BID WC  . furosemide  60 mg Oral Daily  . gabapentin  300 mg Oral QHS  . hydroxychloroquine  200 mg Oral BID  . metoprolol tartrate  12.5 mg Oral BID  . saccharomyces boulardii  250 mg Oral BID  . sodium chloride  3 mL Intravenous Q12H  . vancomycin  125 mg Oral Q12H  . [START ON 12/14/2013] warfarin  2 mg Oral Q M,W,F-1800  . [START ON 12/15/2013] warfarin  4 mg Oral Q T,Th,S,Su-1800  . Warfarin - Pharmacist Dosing Inpatient   Does not apply q1800   Continuous Infusions:   Principal Problem:   Acute on chronic respiratory failure with hypoxemia Active Problems:   CKD (chronic kidney disease) stage 3, GFR 30-59 ml/min   Acute on chronic systolic heart failure   Colitis due to Clostridium difficile   Hypoxia   Hyperkalemia  Time spent: 52min  CHIU, [place] [facility] Pager [phone]. If 7PM-7AM, please contact night-coverage at [email or web address], password [facility] 12/13/2013, 7:11 PM  LOS: 1 day

## 2013-12-13 NOTE — ED Provider Notes (Signed)
I saw and evaluated the patient, reviewed the resident's note and I agree with the findings and plan.   EKG Interpretation   Date/Time:  Saturday December 12 2013 19:33:40 EDT Ventricular Rate:  110 PR Interval:    QRS Duration: 110 QT Interval:  360 QTC Calculation: 487 R Axis:   152 Text Interpretation:  Atrial fibrillation Low voltage, precordial leads  Probable right ventricular hypertrophy Borderline prolonged QT interval  since last tracing no significant change Confirmed by Bralin Garry  MD, Brok Stocking  ([id number]) on 12/12/2013 8:02:00 PM      PT with hx of CHF, having increasing fatigue, SOB.  Signs of fluid overload.  Will start lasix IV, admit.  Malvin Johns, MD 12/13/13 1326

## 2013-12-14 DIAGNOSIS — Z8719 Personal history of other diseases of the digestive system: Secondary | ICD-10-CM

## 2013-12-14 DIAGNOSIS — I5022 Chronic systolic (congestive) heart failure: Secondary | ICD-10-CM

## 2013-12-14 DIAGNOSIS — J9620 Acute and chronic respiratory failure, unspecified whether with hypoxia or hypercapnia: Secondary | ICD-10-CM | POA: Diagnosis not present

## 2013-12-14 LAB — PROTIME-INR
INR: 4.17 — ABNORMAL HIGH (ref 0.00–1.49)
PROTHROMBIN TIME: 40.3 s — AB (ref 11.6–15.2)

## 2013-12-14 LAB — BASIC METABOLIC PANEL
ANION GAP: 14 (ref 5–15)
BUN: 26 mg/dL — ABNORMAL HIGH (ref 6–23)
CALCIUM: 8.9 mg/dL (ref 8.4–10.5)
CO2: 19 meq/L (ref 19–32)
Chloride: 104 mEq/L (ref 96–112)
Creatinine, Ser: 1.92 mg/dL — ABNORMAL HIGH (ref 0.50–1.10)
GFR calc Af Amer: 28 mL/min — ABNORMAL LOW (ref >90)
GFR calc non Af Amer: 24 mL/min — ABNORMAL LOW (ref >90)
Glucose, Bld: 76 mg/dL (ref 70–99)
Potassium: 5 mEq/L (ref 3.7–5.3)
SODIUM: 137 meq/L (ref 137–147)

## 2013-12-14 NOTE — Progress Notes (Signed)
PHARMACY NOTE  Pharmacy Consult :  73 y.o. female is currently on Coumadin for atrial fibrillation, AVR.   Dosing Wt :  77 kg  Hematology :  Recent Labs  12/12/13 1936 12/13/13 0545 12/14/13 0750  HGB 12.9  --   --   HCT 40.3  --   --   PLT 76*  --   --   LABPROT 27.2* 31.6* 40.3*  INR 2.52* 3.06* 4.17*  CREATININE 1.63* 1.88* 1.92*    Current Medication[s] Include: Medication PTA: Prescriptions prior to admission  Medication Sig Dispense Refill  . acetaminophen (TYLENOL) 325 MG tablet Take 2 tablets (650 mg total) by mouth every 6 (six) hours as needed for mild pain (or Fever >/= 101).  30 tablet  1  . amiodarone (PACERONE) 200 MG tablet Take 1 tablet (200 mg total) by mouth daily.  30 tablet  1  . amoxicillin (AMOXIL) 500 MG capsule Take 1 capsule (500 mg total) by mouth every 12 (twelve) hours.      . famotidine (PEPCID) 20 MG tablet Take 1 tablet (20 mg total) by mouth 2 (two) times daily.  60 tablet  3  . Ferrous Fumarate (IRON) 18 MG TBCR Take 18 mg by mouth 2 (two) times daily.      . furosemide (LASIX) 20 MG tablet Take 3 tablets (60 mg total) by mouth daily.  60 tablet  1  . gabapentin (NEURONTIN) 300 MG capsule Take 1 capsule (300 mg total) by mouth at bedtime.  30 capsule  1  . geriatric multivitamins-minerals (ELDERTONIC/GEVRABON) ELIX Take 15 mLs by mouth 2 (two) times daily.      . hydroxychloroquine (PLAQUENIL) 200 MG tablet Take 200 mg by mouth 2 (two) times daily.       . metoprolol tartrate (LOPRESSOR) 12.5 mg TABS tablet Take 0.5 tablets (12.5 mg total) by mouth 2 (two) times daily.  60 tablet  1  . potassium chloride (K-DUR,KLOR-CON) 10 MEQ tablet Take 30 mEq by mouth 2 (two) times daily.      Marland Kitchen saccharomyces boulardii (FLORASTOR) 250 MG capsule Take 1 capsule (250 mg total) by mouth 2 (two) times daily.  60 capsule  3  . vancomycin (VANCOCIN) 50 mg/mL oral solution Take 125 mg PO BID for 7 days, followed by 125 mg daily for 7  days followed by 125 mg every other day for 7 days  50 mL  0  . warfarin (COUMADIN) 2 MG tablet Take 1 tablet (2 mg total) by mouth every Monday, Wednesday, and Friday at 6 PM.  30 tablet  1  . warfarin (COUMADIN) 4 MG tablet Take 1 tablet (4 mg total) by mouth every Tuesday, Thursday, Saturday, and Sunday at 6 PM.  30 tablet  1   Scheduled:  Scheduled:  . amiodarone  200 mg Oral Daily  . amoxicillin  500 mg Oral Q12H  . famotidine  20 mg Oral BID  . ferrous sulfate  325 mg Oral BID WC  . furosemide  60 mg Oral Daily  . gabapentin  300 mg Oral QHS  . hydroxychloroquine  200 mg Oral BID  . metoprolol tartrate  12.5 mg Oral BID  . saccharomyces boulardii  250 mg Oral BID  . sodium chloride  3 mL Intravenous Q12H  . vancomycin  125 mg Oral Q12H  . Warfarin - Pharmacist Dosing Inpatient   Does not apply q1800   Infusion[s]: Infusions:   Antibiotic[s]: Anti-infectives   Start     Dose/Rate Route  Frequency Ordered Stop   12/13/13 0100  vancomycin (VANCOCIN) 50 mg/mL oral solution 125 mg     125 mg Oral Every 12 hours 12/12/13 2354     12/13/13 0100  hydroxychloroquine (PLAQUENIL) tablet 200 mg     200 mg Oral 2 times daily 12/12/13 2358     12/13/13 0000  amoxicillin (AMOXIL) capsule 500 mg     500 mg Oral Every 12 hours 12/12/13 2354        Assessment :  Today's INR continuing to trend upward.   INR  4.17.    No evidence of bleeding complications observed.  Goal :  INR goal is 2-3  Plan : 1. Hold Coumadin today with rising INR. 2. Daily INR's, CBC. Monitor for bleeding complications.   Follow Platelet counts.   Duana Benedict, Craig Guess,  Pharm.D  12/14/2013  11:06 AM

## 2013-12-14 NOTE — Progress Notes (Signed)
12/14/13 Pt.is A/Ox3 and is non-ambulatory. She is maximum assist. Pt.has a poor fluid and food intake. She did have an episode of urinary incontinence today. Bladder scan was also done today to see if patient was retaining any urine.

## 2013-12-14 NOTE — Progress Notes (Signed)
[facility] PROGRESS NOTE  [name] ZOX:[id number] DOB: [birth year] DOA: 12/12/2013 PCP: Hollace Kinnier, DO  Assessment/Plan: 1. Acute on chronic respiratory failure with hypoxia  1. Hypoxia improved with O2 2. BNP elevated from her baseline 3. No pulm edema on CXR 4. Pt noted to have severe pulm HTN on recent 2d echo 5. Will decrease lasix slightly 6. Cr now trending up. Monitor closely 7. Wean O2 as tolerated to maintain sats >92% 2. CKD stage 3 - trending upwards slightly. Cont diuretics per above for now 3. Colitis due to C.Diff - continue po vanc, pt still with diarrhea 4. Chronic endocarditis - continue amoxicillin 5. Hyperkalemia - held potassium replacement. Resolved  Code Status: DNR Family Communication: Pt and family at bedside Disposition Plan: Pending   Consultants:    Procedures:    Antibiotics:  PO Vanc  Amoxicillin  HPI/Subjective: Reports sob slightly improved. No acute events noted overnight  Objective: Filed Vitals:   12/13/13 2253 12/14/13 0220 12/14/13 0722 12/14/13 0940  BP: 108/68 108/62 110/62 111/54  Pulse: 78 72 70 97  Temp: 97.5 F (36.4 C) 97.6 F (36.4 C) 97.8 F (36.6 C) 97 F (36.1 C)  TempSrc: Oral Oral Oral Oral  Resp: 18 18 18 18   Height:      Weight:   76.89 kg (169 lb 8.2 oz)   SpO2: 100% 100% 100% 98%    Intake/Output Summary (Last 24 hours) at 12/14/13 1419 Last data filed at 12/14/13 0953  Gross per 24 hour  Intake      3 ml  Output      0 ml  Net      3 ml   Filed Weights   12/12/13 1938 12/13/13 0040 12/14/13 0722  Weight: 86.382 kg (190 lb 7 oz) 77.9 kg (171 lb 11.8 oz) 76.89 kg (169 lb 8.2 oz)    Exam:   General:  Awake, in nad  Cardiovascular: regular, s1, s2  Respiratory: normal resp effort, no wheezing  Abdomen: soft,nondistended  Musculoskeletal: perfused, no clubbing   Data Reviewed: Basic Metabolic Panel:  Recent Labs Lab 12/12/13 1936 12/13/13 0545 12/14/13 0750  NA  137 140 137  K 5.7* 4.9 5.0  CL 102 104 104  CO2 19 23 19   GLUCOSE 88 93 76  BUN 21 24* 26*  CREATININE 1.63* 1.88* 1.92*  CALCIUM 9.0 8.9 8.9   Liver Function Tests: No results found for this basename: AST, ALT, ALKPHOS, BILITOT, PROT, ALBUMIN,  in the last 168 hours No results found for this basename: LIPASE, AMYLASE,  in the last 168 hours No results found for this basename: AMMONIA,  in the last 168 hours CBC:  Recent Labs Lab 12/12/13 1936  WBC 6.1  HGB 12.9  HCT 40.3  MCV 94.6  PLT 76*   Cardiac Enzymes: No results found for this basename: CKTOTAL, CKMB, CKMBINDEX, TROPONINI,  in the last 168 hours BNP (last 3 results)  Recent Labs  11/27/13 2111 12/04/13 1400 12/12/13 1946  PROBNP 7774.0* 6091.0* 13086.0*   CBG: No results found for this basename: GLUCAP,  in the last 168 hours  Recent Results (from the past 240 hour(s))  MRSA PCR SCREENING     Status: None   Collection Time    12/13/13  8:00 AM      Result Value Ref Range Status   MRSA by PCR NEGATIVE  NEGATIVE Final   Comment:            The GeneXpert MRSA  Assay (FDA     approved for NASAL specimens     only), is one component of a     comprehensive MRSA colonization     surveillance program. It is not     intended to diagnose MRSA     infection nor to guide or     monitor treatment for     MRSA infections.     Studies: Dg Chest 2 View  12/12/2013   CLINICAL DATA:  Short of breath.  EXAM: CHEST  2 VIEW  COMPARISON:  12/04/2013.  FINDINGS: Stable changes from previous cardiac surgery. Cardiac silhouette is mildly enlarged. No mediastinal or hilar masses.  Mild chronic interstitial prominence. No lung consolidation or edema. No pleural effusion or pneumothorax.  Bony thorax is demineralized but grossly intact.  IMPRESSION: No acute cardiopulmonary disease.   Electronically Signed   By: Lajean Manes M.D.   On: 12/12/2013 21:13    Scheduled Meds: . amiodarone  200 mg Oral Daily  . amoxicillin  500  mg Oral Q12H  . famotidine  20 mg Oral BID  . ferrous sulfate  325 mg Oral BID WC  . gabapentin  300 mg Oral QHS  . hydroxychloroquine  200 mg Oral BID  . metoprolol tartrate  12.5 mg Oral BID  . saccharomyces boulardii  250 mg Oral BID  . sodium chloride  3 mL Intravenous Q12H  . vancomycin  125 mg Oral Q12H  . Warfarin - Pharmacist Dosing Inpatient   Does not apply q1800   Continuous Infusions:   Principal Problem:   Acute on chronic respiratory failure with hypoxemia Active Problems:   CKD (chronic kidney disease) stage 3, GFR 30-59 ml/min   Acute on chronic systolic heart failure   Colitis due to Clostridium difficile   Hypoxia   Hyperkalemia  Time spent: 50min  Anneka Studer, [place] [facility] Pager [phone]. If 7PM-7AM, please contact night-coverage at [email or web address], password [facility] 12/14/2013, 2:19 PM  LOS: 2 days

## 2013-12-15 DIAGNOSIS — I2789 Other specified pulmonary heart diseases: Secondary | ICD-10-CM

## 2013-12-15 DIAGNOSIS — J9620 Acute and chronic respiratory failure, unspecified whether with hypoxia or hypercapnia: Secondary | ICD-10-CM | POA: Diagnosis not present

## 2013-12-15 DIAGNOSIS — J9610 Chronic respiratory failure, unspecified whether with hypoxia or hypercapnia: Secondary | ICD-10-CM

## 2013-12-15 LAB — BLOOD GAS, ARTERIAL
ACID-BASE DEFICIT: 2.8 mmol/L — AB (ref 0.0–2.0)
BICARBONATE: 21.7 meq/L (ref 20.0–24.0)
Drawn by: 35849
O2 Content: 5 L/min
O2 Saturation: 98 %
Patient temperature: 98
TCO2: 22.9 mmol/L (ref 0–100)
pCO2 arterial: 38.6 mmHg (ref 35.0–45.0)
pH, Arterial: 7.368 (ref 7.350–7.450)
pO2, Arterial: 106 mmHg — ABNORMAL HIGH (ref 80.0–100.0)

## 2013-12-15 LAB — HEPATIC FUNCTION PANEL
ALT: 8 U/L (ref 0–35)
AST: 25 U/L (ref 0–37)
Albumin: 2.3 g/dL — ABNORMAL LOW (ref 3.5–5.2)
Alkaline Phosphatase: 164 U/L — ABNORMAL HIGH (ref 39–117)
BILIRUBIN INDIRECT: 0.6 mg/dL (ref 0.3–0.9)
Bilirubin, Direct: 0.6 mg/dL — ABNORMAL HIGH (ref 0.0–0.3)
TOTAL PROTEIN: 6.2 g/dL (ref 6.0–8.3)
Total Bilirubin: 1.2 mg/dL (ref 0.3–1.2)

## 2013-12-15 LAB — URINALYSIS, ROUTINE W REFLEX MICROSCOPIC
Glucose, UA: NEGATIVE mg/dL
Ketones, ur: 15 mg/dL — AB
Nitrite: NEGATIVE
PH: 5 (ref 5.0–8.0)
Protein, ur: 30 mg/dL — AB
SPECIFIC GRAVITY, URINE: 1.017 (ref 1.005–1.030)
Urobilinogen, UA: 0.2 mg/dL (ref 0.0–1.0)

## 2013-12-15 LAB — BASIC METABOLIC PANEL
ANION GAP: 15 (ref 5–15)
BUN: 28 mg/dL — ABNORMAL HIGH (ref 6–23)
CO2: 21 mEq/L (ref 19–32)
Calcium: 8.8 mg/dL (ref 8.4–10.5)
Chloride: 100 mEq/L (ref 96–112)
Creatinine, Ser: 2.08 mg/dL — ABNORMAL HIGH (ref 0.50–1.10)
GFR calc non Af Amer: 22 mL/min — ABNORMAL LOW (ref >90)
GFR, EST AFRICAN AMERICAN: 26 mL/min — AB (ref >90)
Glucose, Bld: 86 mg/dL (ref 70–99)
Potassium: 4.6 mEq/L (ref 3.7–5.3)
Sodium: 136 mEq/L — ABNORMAL LOW (ref 137–147)

## 2013-12-15 LAB — URINE MICROSCOPIC-ADD ON

## 2013-12-15 LAB — PROTIME-INR
INR: 4.12 — ABNORMAL HIGH (ref 0.00–1.49)
Prothrombin Time: 39.9 seconds — ABNORMAL HIGH (ref 11.6–15.2)

## 2013-12-15 MED ORDER — VANCOMYCIN 50 MG/ML ORAL SOLUTION
125.0000 mg | ORAL | Status: DC
  Administered 2013-12-16 – 2013-12-17 (×2): 125 mg via ORAL
  Filled 2013-12-15 (×2): qty 2.5

## 2013-12-15 MED ORDER — FAMOTIDINE 20 MG PO TABS
20.0000 mg | ORAL_TABLET | Freq: Every day | ORAL | Status: DC
  Administered 2013-12-16 – 2013-12-17 (×2): 20 mg via ORAL
  Filled 2013-12-15 (×2): qty 1

## 2013-12-15 NOTE — Progress Notes (Addendum)
[facility] PROGRESS NOTE  [name] AJO:[id number] DOB: Jul 15, 1940 DOA: 12/12/2013 PCP: Hollace Kinnier, DO  Off Service Summary Unfortunate 73yo with a hx of severe pulm HTN who presents with sob. Pt was found to have an elevated BNP with increased O2 requirements. The patient was started on lasix, however the patient's O2 requirements remained elevated with an elevated Cr. Lasix has since been stopped. The patient's cardiologist, Dr. Terrence Dupont, has been consulted to assist with management of this difficult case.  Assessment/Plan: 1. Acute on chronic respiratory failure with hypoxia  1. Hypoxia improved with O2, currently on 4L 2. BNP of 13k elevated from her baseline 3. No pulm edema on CXR 4. Pt noted to have severe pulm HTN on recent 2d echo 5. Will hold lasix secondary to worsening renal fx (see below) 6. Cr now trending up. Monitor closely 7. Wean O2 as tolerated to maintain sats >92% 2. Acute on CKD stage 3 1. Cr continues to trend up with diuretics with little change in O2 requirements 2. Will hold off on diuretics for now 3. Colitis due to C.Diff 1. Continue po vanc 4. Chronic endocarditis  1. Continue amoxicillin 5. Hyperkalemia 1. Resolved  Code Status: DNR Family Communication: Pt and family at bedside Disposition Plan: Pending   Consultants:    Procedures:    Antibiotics:  PO Vanc  Amoxicillin  HPI/Subjective: More tired today. No acute events noted overnight.  Objective: Filed Vitals:   12/14/13 2240 12/15/13 0337 12/15/13 1011 12/15/13 1300  BP: 115/51 112/63 100/57 101/61  Pulse:  97 98 92  Temp: 98 F (36.7 C) 99.3 F (37.4 C)  97.3 F (36.3 C)  TempSrc: Oral Axillary  Axillary  Resp: 18 21  18   Height:      Weight:  78.8 kg (173 lb 11.6 oz)    SpO2: 98% 94%  100%    Intake/Output Summary (Last 24 hours) at 12/15/13 1630 Last data filed at 12/15/13 0920  Gross per 24 hour  Intake    480 ml  Output    300 ml  Net    180 ml    Filed Weights   12/13/13 0040 12/14/13 0722 12/15/13 0337  Weight: 77.9 kg (171 lb 11.8 oz) 76.89 kg (169 lb 8.2 oz) 78.8 kg (173 lb 11.6 oz)    Exam:   General:  Asleep, arousable to painful stimuli, in nad  Cardiovascular: regular, s1, s2  Respiratory: normal resp effort, no wheezing  Abdomen: soft,nondistended  Musculoskeletal: perfused, no clubbing   Data Reviewed: Basic Metabolic Panel:  Recent Labs Lab 12/12/13 1936 12/13/13 0545 12/14/13 0750 12/15/13 0416  NA 137 140 137 136*  K 5.7* 4.9 5.0 4.6  CL 102 104 104 100  CO2 19 23 19 21   GLUCOSE 88 93 76 86  BUN 21 24* 26* 28*  CREATININE 1.63* 1.88* 1.92* 2.08*  CALCIUM 9.0 8.9 8.9 8.8   Liver Function Tests: No results found for this basename: AST, ALT, ALKPHOS, BILITOT, PROT, ALBUMIN,  in the last 168 hours No results found for this basename: LIPASE, AMYLASE,  in the last 168 hours No results found for this basename: AMMONIA,  in the last 168 hours CBC:  Recent Labs Lab 12/12/13 1936  WBC 6.1  HGB 12.9  HCT 40.3  MCV 94.6  PLT 76*   Cardiac Enzymes: No results found for this basename: CKTOTAL, CKMB, CKMBINDEX, TROPONINI,  in the last 168 hours BNP (last 3 results)  Recent Labs  11/27/13 2111  12/04/13 1400 12/12/13 1946  PROBNP 7774.0* 6091.0* 13086.0*   CBG: No results found for this basename: GLUCAP,  in the last 168 hours  Recent Results (from the past 240 hour(s))  MRSA PCR SCREENING     Status: None   Collection Time    12/13/13  8:00 AM      Result Value Ref Range Status   MRSA by PCR NEGATIVE  NEGATIVE Final   Comment:            The GeneXpert MRSA Assay (FDA     approved for NASAL specimens     only), is one component of a     comprehensive MRSA colonization     surveillance program. It is not     intended to diagnose MRSA     infection nor to guide or     monitor treatment for     MRSA infections.     Studies: No results found.  Scheduled Meds: . amiodarone  200  mg Oral Daily  . amoxicillin  500 mg Oral Q12H  . [START ON 12/16/2013] famotidine  20 mg Oral Daily  . ferrous sulfate  325 mg Oral BID WC  . gabapentin  300 mg Oral QHS  . hydroxychloroquine  200 mg Oral BID  . metoprolol tartrate  12.5 mg Oral BID  . saccharomyces boulardii  250 mg Oral BID  . sodium chloride  3 mL Intravenous Q12H  . [START ON 12/16/2013] vancomycin  125 mg Oral Q24H  . Warfarin - Pharmacist Dosing Inpatient   Does not apply q1800   Continuous Infusions:   Principal Problem:   Acute on chronic respiratory failure with hypoxemia Active Problems:   CKD (chronic kidney disease) stage 3, GFR 30-59 ml/min   Acute on chronic systolic heart failure   Colitis due to Clostridium difficile   Hypoxia   Hyperkalemia  Time spent: 89min  CHIU, [place] [facility] Pager [phone]. If 7PM-7AM, please contact night-coverage at [email or web address], password [facility] 12/15/2013, 4:30 PM  LOS: 3 days

## 2013-12-15 NOTE — Progress Notes (Signed)
PT Cancellation Note  Patient Details Name: [name] MRN: [id number] DOB: 10-03-1940   Cancelled Treatment:    Reason Eval/Treat Not Completed: Medical issues which prohibited therapy.  Daughter meeting with social worker discussing long term plans for pt and possibility of hospice.  PT spoke to daughter who is requesting to hold off this AM so she can speak with other family members.  Will check back tomorrow.   [name] 12/15/2013, 10:33 AM

## 2013-12-15 NOTE — Clinical Social Work Psychosocial (Addendum)
Clinical Social Work Department BRIEF PSYCHOSOCIAL ASSESSMENT 12/14/2013  Patient:  [name], [name]     Account Number:  [id number]     Admit date:  12/12/2013  Clinical Social Worker:  Elam Dutch  Date/Time:  12/14/2013 01:45 PM  Referred by:  Physician  Date Referred:  12/14/2013 Referred for  Other - See comment   Other Referral:   From SNF   Interview type:  Other - See comment Other interview type:   Pateint and daughter    PSYCHOSOCIAL DATA Living Status:  FACILITY Admitted from facility:  [facility] Level of care:  [place] Primary support name:  [name]  [phone] Primary support relationship to patient:  CHILD, ADULT Degree of support available:   Extremely supportive  Patient has 3 others daughters and 1 son. All are very supportive    CURRENT CONCERNS Current Concerns  Other - See comment   Other Concerns:   Return to SNF vs home with family    [place] / PLAN 73 year old female- resident of [facility] where she was placed recently by this CSW.   Met with patient and one of her daughters- [name] to discuss d/c options/needs as patient has been asking daughters to go home.  CSW discussed home options with HH/DME vs return to [facility] for short term SNF.  Daughters want to defer to patient to make the decision and she finally stated that she would agree to return to [facility] and hope to return home with daughter in the near future.  She does not like getting Physical Therapy (per patient) Fl2 initiated and will be placed on chart for MD's signature. CSW spoke to Jolyne Loa at [facility]. She stated that they would accept patient back when medically stable.   Assessment/plan status:  Psychosocial Support/Ongoing Assessment of Needs Other assessment/ plan:   Information/referral to community resources:   Home Health/DME discussed and services offered.    PATIENT'S/FAMILY'S RESPONSE TO PLAN OF CARE: Patient  is alert and oriented to person and place- she is noted to be engaged and talkative with daughter at bedside. Patient has verbalized [name] desire to go home but agrees to return to [facility] short term. Her family will defer to her wishes.  Patient is extremely pleasant and all family is noted to be very invovled and supportive.  CSW will monitor and assist patient with d/c when stable.

## 2013-12-15 NOTE — Progress Notes (Signed)
ANTICOAGULATION CONSULT NOTE - Follow up  Pharmacy Consult for Warfarin  Indication: atrial fibrillation, AVR  Allergies  Allergen Reactions  . Ceftriaxone Itching and Rash    RASH BUT TOLERATED AMPICILLIN AND IMIPENEM WITHOUT PROBLEMS  . Codeine Nausea Only    Patient Measurements: Height: 5\' 7"  (170.2 cm) Weight: 173 lb 11.6 oz (78.8 kg) IBW/kg (Calculated) : 61.6  Vital Signs: Temp: 99.3 F (37.4 C) (09/22 0337) Temp src: Axillary (09/22 0337) BP: 100/57 mmHg (09/22 1011) Pulse Rate: 98 (09/22 1011)  Labs:  Recent Labs  12/12/13 1936 12/13/13 0545 12/14/13 0750 12/15/13 0416  HGB 12.9  --   --   --   HCT 40.3  --   --   --   PLT 76*  --   --   --   LABPROT 27.2* 31.6* 40.3* 39.9*  INR 2.52* 3.06* 4.17* 4.12*  CREATININE 1.63* 1.88* 1.92* 2.08*    Estimated Creatinine Clearance: 25.3 ml/min (by C-G formula based on Cr of 2.08).    Assessment: INR today is 4.12, SUPRA-therapeutic in this 73 y/o female who is on chronic warfarin PTA for afib/AVR.  INR was therapeutic on admit at 2.52, but has increased to above goal >3.0. We have only given her 2mg  coumadin on 12/13/13. Coumadin dose held yesterday 9/21. She continues on home dose amiodarone. Continues on chronic amoxicillin and oral vancomycin.  Chronic thrombocytopenia, plts were low 76K on admit, slightly lower than previous.  No bleeding noted.  PTA dose: Warfarin 4 mg TRSS, 2mg  MWF-  Goal of Therapy:  INR 2-3 Monitor platelets by anticoagulation protocol: Yes   Plan:  No coumadin today. Daily PT/INR   Nicole Cella, RPh Clinical Pharmacist Pager: [phone]  12/15/2013,10:38 AM

## 2013-12-15 NOTE — Care Management Note (Addendum)
  Page 2 of 2   12/16/2013     2:55:09 PM CARE MANAGEMENT NOTE 12/16/2013  Patient:  [name],[name]   Account Number:  [id number]  Date Initiated:  12/15/2013  Documentation initiated by:  Lauria Depoy  Subjective/Objective Assessment:   SOB, acute on chronic Renal failure.  From [place]     Action/Plan:   CM to follow for disposition needs   Anticipated DC Date:  12/17/2013   Anticipated DC Plan:  SKILLED NURSING FACILITY  In-house referral  Clinical Social Worker      DC Planning Services  CM consult  Other      [facility] Choice  NA   Choice offered to / List presented to:  [name]           Status of service:  Completed, signed off Medicare Important Message given?  YES (If response is "NO", the following Medicare IM given date fields will be blank) Date Medicare IM given:  12/16/2013 Medicare IM given by:  Thai Hemrick Date Additional Medicare IM given:   Additional Medicare IM given by:    Discharge Disposition:  [place]  Per UR Regulation:  Reviewed for med. necessity/level of care/duration of stay  If discussed at [place] of Stay Meetings, dates discussed:    Comments:  Maecyn Panning RN, BSN, MSHL, CCM  Nurse - Case Manager,  (Unit [phone]  12/16/2013 Patient alert and smiling today.  DTR/STephanie at bedside and discussed d/c plan with patient. Patient elects to return back to SNF/[facility] for rehab. (SW/Donna Crowder notified) as well as Dr. Dyann Kief. Dispo Plan:  SNF/[facility]   Dangelo Guzzetta RN, BSN, MSHL, CCM  Nurse - Case Manager,  (Unit [place])  [phone]  12/15/2013 ADM:  SOB, Acute on chronic respiratory failure with hypoxemia Readmission Hx:  11/27/2013 -  12/08/2013  Atrial fibrillation with RVR Social:  From [facility]; patient was d/c to [facility] on 12/08/2013 CM and SW/Donna Crowder met with DTR/PCG/Stephanie to discuss d/c plan. DTR prefers to take patient home and will discuss [place] with her family  / siblings today and get back to CM/SW re: possible decision to go home with [facility] Hospice services. Dispo Plan:  Pending.

## 2013-12-15 NOTE — Consult Note (Signed)
Reason for Consult: Congestive heart failure/worsening renal function Referring Physician: [facility] hospitalist  [name] is an 73 y.o. female.  HPI: Patient is 73 year old female with multiple medical problems i.e. 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 Was started on IV Lasix with slight improvement in breathing and leg swelling but worsening renal function. Patient denies any complaints at this point but complains of generalized weakness with poor appetite denies any fever or chills. Denies chest pain nausea vomiting or diaphoresis. Denies any palpitations. Denies abdominal pain or diarrhea.  Past Medical History  Diagnosis Date  . Coronary artery disease   . Atrial fibrillation     Now NSR  . GERD (gastroesophageal reflux disease)   . Hyperlipidemia   . 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 Anemia   . Angina   . Hypertension   . Blood transfusion     "related to bad case of bronchitis once; 1 yr ago given due to GIB" (12/17/2012)  . H/O hiatal hernia    . Lupus     "that's compromised her lungs somewhat" (12/17/2012)  . Insomnia   . Endocarditis   . Prosthetic valve endocarditis   . Enterococcal bacteremia   . Heart murmur   . CHF (congestive heart failure)   . Pneumonia     "once or twice" (12/17/2012)  . Chronic bronchitis     "used to get it once/yr; hasn't had it since ~ 1986" (12/17/2012)  . Shortness of breath     "all the times sometimes" (12/17/2012)  . On home oxygen therapy     "2L prn; always at night" (12/17/2012)  . Lower GI bleed ~ 01/2012  . Arthritis     "hands" (12/17/2012)  . 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 Right bundle branch block 11/07/2013    Past Surgical History  Procedure Laterality Date  . Givens capsule study  04/09/2011    Procedure: GIVENS CAPSULE STUDY;  Surgeon: Beryle Beams, MD;  Location: [place];  Service: Endoscopy;  Laterality: N/A;  . Tee without cardioversion  02/06/2012    Procedure: TRANSESOPHAGEAL ECHOCARDIOGRAM (TEE);  Surgeon: Birdie Riddle, MD;  Location: [facility] ENDOSCOPY;  Service: Cardiovascular;  Laterality: N/A;  . Esophagogastroduodenoscopy  02/12/2012    Procedure: ESOPHAGOGASTRODUODENOSCOPY (EGD);  Surgeon: Beryle Beams, MD;  Location: [facility] ENDOSCOPY;  Service: Endoscopy;  Laterality: N/A;  . Tee without cardioversion N/A 07/15/2012    Procedure: TRANSESOPHAGEAL ECHOCARDIOGRAM (TEE);  Surgeon: Birdie Riddle, MD;  Location: [facility] ENDOSCOPY;  Service: Cardiovascular;  Laterality: N/A;  . Breast lumpectomy Left 1977    "benign" (12/17/2012)  . Tonsillectomy  1940's  . Coronary artery bypass graft  2006  . 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   Marland Kitchen  Cardiac catheterization      "probably 2-3" (12/17/2012)  . Coronary angioplasty with stent placement      "several put in over the years" (12/17/2012)  . Cataract extraction w/ intraocular lens  implant, bilateral Bilateral 11/2000  . Peripherally inserted central catheter  insertion Right 12/15/2012    "upper arm" (12/17/2012    Family History  Problem Relation Age of Onset  . Heart disease Mother     CHF  . Mental illness Father     suicide  . Cancer Sister     pancreatic cancer  . COPD Brother     Social History:  reports that she has never smoked. She has never used smokeless tobacco. She reports that she does not drink alcohol or use illicit drugs.  Allergies:  Allergies  Allergen Reactions  . Ceftriaxone Itching and Rash    RASH BUT TOLERATED AMPICILLIN AND IMIPENEM WITHOUT PROBLEMS  . Codeine Nausea Only    Medications: I have reviewed the patient's current medications.  Results for orders placed during the hospital encounter of 12/12/13 (from the past 48 hour(s))  BASIC METABOLIC PANEL     Status: Abnormal   Collection Time    12/14/13  7:50 AM      Result Value Ref Range   Sodium 137  137 - 147 mEq/L   Potassium 5.0  3.7 - 5.3 mEq/L   Comment: HEMOLYSIS AT THIS LEVEL MAY AFFECT RESULT   Chloride 104  96 - 112 mEq/L   CO2 19  19 - 32 mEq/L   Glucose, Bld 76  70 - 99 mg/dL   BUN 26 (*) 6 - 23 mg/dL   Creatinine, Ser 1.92 (*) 0.50 - 1.10 mg/dL   Calcium 8.9  8.4 - 10.5 mg/dL   GFR calc non Af Amer 24 (*) >90 mL/min   GFR calc Af Amer 28 (*) >90 mL/min   Comment: (NOTE)     The eGFR has been calculated using the CKD EPI equation.     This calculation has not been validated in all clinical situations.     eGFR's persistently <90 mL/min signify possible Chronic Kidney     Disease.   Anion gap 14  5 - 15  PROTIME-INR     Status: Abnormal   Collection Time    12/14/13  7:50 AM      Result Value Ref Range   Prothrombin Time 40.3 (*) 11.6 - 15.2 seconds   INR 4.17 (*) 0.00 - 8.41  BASIC METABOLIC PANEL     Status: Abnormal   Collection Time    12/15/13  4:16 AM      Result Value Ref Range   Sodium 136 (*) 137 - 147 mEq/L   Potassium 4.6  3.7 - 5.3 mEq/L   Comment: HEMOLYSIS AT THIS LEVEL MAY AFFECT RESULT   Chloride 100  96 -  112 mEq/L   CO2 21  19 - 32 mEq/L   Glucose, Bld 86  70 - 99 mg/dL   BUN 28 (*) 6 - 23 mg/dL   Creatinine, Ser 2.08 (*) 0.50 - 1.10 mg/dL   Calcium 8.8  8.4 - 10.5 mg/dL   GFR calc non Af Amer 22 (*) >90 mL/min   GFR calc Af Amer 26 (*) >90 mL/min   Comment: (NOTE)     The eGFR has been calculated using the CKD EPI equation.     This calculation has not been validated in all clinical situations.  eGFR's persistently <90 mL/min signify possible Chronic Kidney     Disease.   Anion gap 15  5 - 15  PROTIME-INR     Status: Abnormal   Collection Time    12/15/13  4:16 AM      Result Value Ref Range   Prothrombin Time 39.9 (*) 11.6 - 15.2 seconds   INR 4.12 (*) 0.00 - 1.49  BLOOD GAS, ARTERIAL     Status: Abnormal   Collection Time    12/15/13  1:20 PM      Result Value Ref Range   O2 Content 5.0     Delivery systems NASAL CANNULA     pH, Arterial 7.368  7.350 - 7.450   pCO2 arterial 38.6  35.0 - 45.0 mmHg   pO2, Arterial 106.0 (*) 80.0 - 100.0 mmHg   Bicarbonate 21.7  20.0 - 24.0 mEq/L   TCO2 22.9  0 - 100 mmol/L   Acid-base deficit 2.8 (*) 0.0 - 2.0 mmol/L   O2 Saturation 98.0     Patient temperature 98.0     Collection site RIGHT RADIAL     Drawn by [phone]     Sample type ARTERIAL     Allens test (pass/fail) PASS  PASS    No results found.  Review of Systems  Constitutional: Positive for malaise/fatigue. Negative for fever and chills.  Eyes: Negative for double vision.  Respiratory: Positive for shortness of breath.   Cardiovascular: Positive for leg swelling. Negative for chest pain, palpitations and orthopnea.  Gastrointestinal: Negative for vomiting, abdominal pain and diarrhea.  Genitourinary: Negative for dysuria.  Neurological: Positive for dizziness and weakness.   Blood pressure 101/61, pulse 92, temperature 97.3 F (36.3 C), temperature source Axillary, resp. rate 18, height $RemoveBe'5\' 7"'EmapnqPpr$  (1.702 m), weight 78.8 kg (173 lb 11.6 oz), SpO2 100.00%. Physical Exam   Constitutional: She is oriented to person, place, and time.  HENT:  Head: Normocephalic and atraumatic.  Eyes: Conjunctivae are normal. Left eye exhibits discharge. No scleral icterus.  Neck: Normal range of motion. Neck supple. No tracheal deviation present. No thyromegaly present.  Cardiovascular: Normal rate.   Murmur (2/6 systolic and soft diastolic murmur noted soft S3 gallop noted) heard. Respiratory:  Decreased breath sound at bases with fine bilateral rales noted  GI: Soft. Bowel sounds are normal. She exhibits no distension. There is no tenderness.  Musculoskeletal:  No clubbing cyanosis 1+ edema noted  Neurological: She is alert and oriented to person, place, and time.    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 may need to restart Lasix lower dose. Monitor renal function Patient encouraged to drink ensure. Consider dietary consult Prognosis guarded  Jarreau Callanan N 12/15/2013, 6:41 PM

## 2013-12-15 NOTE — Progress Notes (Signed)
CSW, BSW Intern  and [employer] met with one of patient's daughters this morning and then with all the children again this afternoon to discuss d/c disposition. As of yesterday- the d/c plan was to return temporarily to  [facility] with plan to move in with her daughter [name]. The family would move all of her DME from daughter- Penny's home to [place] and she would require a hospital bed.  CSW discussed with [facility] yesterday and they were agreeable with this plan. Patient was noted to be alert and engaged yesterday and was making her own decisions re: d/c disposition.  This morning- however, patient was noted to be less responsive and was hard to engage. Support provided to her daughter who hoped that patient was simply sleepy. By the afternoon- she remained poorly responsive and would not open her eyes.  The family met to discuss options and are again requesting [facility] as first choice- if unable to offer a bed- plan to take patient home with [facility] with Hospice services. They will discuss further with MD but feel that patient wanted to go home and they want to make her wishes come true. CSW will notify Harmon Pier at [facility] to determine if a bed is possible and follow up in the morning. Lorie Phenix. Pauline Good, [place]

## 2013-12-16 DIAGNOSIS — J9620 Acute and chronic respiratory failure, unspecified whether with hypoxia or hypercapnia: Secondary | ICD-10-CM | POA: Diagnosis not present

## 2013-12-16 LAB — BASIC METABOLIC PANEL
ANION GAP: 15 (ref 5–15)
BUN: 32 mg/dL — ABNORMAL HIGH (ref 6–23)
CHLORIDE: 102 meq/L (ref 96–112)
CO2: 19 meq/L (ref 19–32)
CREATININE: 2.15 mg/dL — AB (ref 0.50–1.10)
Calcium: 8.9 mg/dL (ref 8.4–10.5)
GFR calc Af Amer: 25 mL/min — ABNORMAL LOW (ref >90)
GFR calc non Af Amer: 21 mL/min — ABNORMAL LOW (ref >90)
Glucose, Bld: 76 mg/dL (ref 70–99)
Potassium: 4.5 mEq/L (ref 3.7–5.3)
Sodium: 136 mEq/L — ABNORMAL LOW (ref 137–147)

## 2013-12-16 LAB — PROTIME-INR
INR: 4.4 — AB (ref 0.00–1.49)
Prothrombin Time: 42 seconds — ABNORMAL HIGH (ref 11.6–15.2)

## 2013-12-16 MED ORDER — TORSEMIDE 10 MG PO TABS
10.0000 mg | ORAL_TABLET | Freq: Every day | ORAL | Status: DC
  Filled 2013-12-16: qty 1

## 2013-12-16 MED ORDER — FUROSEMIDE 10 MG/ML IJ SOLN
INTRAMUSCULAR | Status: AC
  Administered 2013-12-16: 40 mg via INTRAVENOUS
  Filled 2013-12-16: qty 4

## 2013-12-16 MED ORDER — FUROSEMIDE 10 MG/ML IJ SOLN
40.0000 mg | Freq: Once | INTRAMUSCULAR | Status: AC
  Administered 2013-12-16: 40 mg via INTRAVENOUS

## 2013-12-16 NOTE — Progress Notes (Signed)
Subjective:  More alert and awake with occasional episodes of confusion. Denies any chest pain or shortness of breath.  Objective:  Vital Signs in the last 24 hours: Temp:  [97.3 F (36.3 C)-98 F (36.7 C)] 98 F (36.7 C) (09/23 0546) Pulse Rate:  [92-98] 98 (09/23 0546) Resp:  [18] 18 (09/23 0546) BP: (101-105)/(60-61) 105/60 mmHg (09/23 0546) SpO2:  [94 %-100 %] 94 % (09/23 0546) Weight:  [78 kg (171 lb 15.3 oz)] 78 kg (171 lb 15.3 oz) (09/23 0546)  Intake/Output from previous day: 09/22 0701 - 09/23 0700 In: 360 [P.O.:360] Out: -  Intake/Output from this shift: Total I/O In: 360 [P.O.:360] Out: -   Physical Exam: Neck: no adenopathy, no carotid bruit, no JVD and supple, symmetrical, trachea midline Lungs: Bibasilar rales noted Heart: irregularly irregular rhythm, S1, S2 normal and 2/6 systolic murmur, diastolic murmur and soft S3 gallop noted Abdomen: soft, non-tender; bowel sounds normal; no masses,  no organomegaly Extremities: No clubbing cyanosis 1+ edema noted  Lab Results: No results found for this basename: WBC, HGB, PLT,  in the last 72 hours  Recent Labs  12/15/13 0416 12/16/13 0615  NA 136* 136*  K 4.6 4.5  CL 100 102  CO2 21 19  GLUCOSE 86 76  BUN 28* 32*  CREATININE 2.08* 2.15*   No results found for this basename: TROPONINI, CK, MB,  in the last 72 hours Hepatic Function Panel  Recent Labs  12/15/13 1930  PROT 6.2  ALBUMIN 2.3*  AST 25  ALT 8  ALKPHOS 164*  BILITOT 1.2  BILIDIR 0.6*  IBILI 0.6   No results found for this basename: CHOL,  in the last 72 hours No results found for this basename: PROTIME,  in the last 72 hours  Imaging: Imaging results have been reviewed and No results found.  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

## 2013-12-16 NOTE — Progress Notes (Signed)
ANTICOAGULATION CONSULT NOTE - Follow up  Pharmacy Consult for Warfarin  Indication: atrial fibrillation, AVR  Allergies  Allergen Reactions  . Ceftriaxone Itching and Rash    RASH BUT TOLERATED AMPICILLIN AND IMIPENEM WITHOUT PROBLEMS  . Codeine Nausea Only    Patient Measurements: Height: 5\' 7"  (170.2 cm) Weight: 171 lb 15.3 oz (78 kg) IBW/kg (Calculated) : 61.6  Vital Signs: Temp: 97.4 F (36.3 C) (09/23 1441) Temp src: Oral (09/23 1441) BP: 102/60 mmHg (09/23 1441) Pulse Rate: 86 (09/23 1441)  Labs:  Recent Labs  12/14/13 0750 12/15/13 0416 12/16/13 0615  LABPROT 40.3* 39.9* 42.0*  INR 4.17* 4.12* 4.40*  CREATININE 1.92* 2.08* 2.15*    Estimated Creatinine Clearance: 24.3 ml/min (by C-G formula based on Cr of 2.15).    Assessment: INR today increased again to 4.42 today from 4.12.  INR remains SUPRA-therapeutic in this 73 y/o female who is on chronic warfarin PTA for afib/bioprosthetic AVR.  INR was therapeutic on admit at 2.52, but has increased to above goal >3.0. We have only given her 2mg  coumadin on 12/13/13. Coumadin dose held since that time.  She continues on home dose amiodarone. Continues on chronic amoxicillin and oral vancomycin.  Chronic thrombocytopenia, plts were low 76K on admit, slightly lower than previous.  No bleeding noted.  PTA dose: Warfarin 4 mg TRSS, 2mg  MWF-   Discharge Disposition: SKILLED NURSING FACILITY  Anticipated DC Date: 12/17/2013    Goal of Therapy:  INR 2-3 Monitor platelets by anticoagulation protocol: Yes   Plan:  No coumadin today. Daily PT/INR   Nicole Cella, RPh Clinical Pharmacist Pager: [phone]  12/16/2013,4:17 PM

## 2013-12-16 NOTE — Progress Notes (Signed)
PT Cancellation Note  Patient Details Name: [name] MRN: [id number] DOB: 1941-01-13   Cancelled Treatment:    Reason Eval/Treat Not Completed: PT screened, no needs identified, will sign off.  Checked in on pt who was sleeping and PT did not want to wake. Discussed pt case with RN who states plan is for hospice services at home, and may be returning home today. Will sign off at this time. If needs change, please reconsult.    Jolyn Lent 12/16/2013, 9:57 AM  Jolyn Lent, PT, DPT Acute Rehabilitation Services Pager: [phone]

## 2013-12-16 NOTE — Progress Notes (Signed)
OT Cancellation Note  Patient Details Name: [name] MRN: [id number] DOB: [birth year]   Cancelled Treatment:    Reason Eval/Treat Not Completed:  Pt with medical decline per nursing. Plan is for home with hospice care as early as today. Signing off. Please reconsult as appropriate.  [name] 12/16/2013, 11:24 AM [phone]

## 2013-12-16 NOTE — Progress Notes (Signed)
[facility] PROGRESS NOTE  [name]:[id number] DOB: 04-14-1940 DOA: 12/12/2013 PCP: Hollace Kinnier, DO  Off Service Summary Unfortunate 73yo with a hx of severe pulm HTN who presents with sob. Pt was found to have an elevated BNP with increased O2 requirements. The patient was started on lasix, however the patient's O2 requirements remained elevated with an elevated Cr. Lasix has since been stopped. The patient's cardiologist, Dr. Terrence Dupont, has been consulted to assist with management of this difficult case.  Assessment/Plan: 1. Acute on chronic respiratory failure with hypoxia  1. Hypoxia improved with O2, currently on 3L 2. BNP of 13k elevated from her baseline 3. No pulm edema on CXR 4. Pt noted to have severe pulm HTN on recent 2d echo 5. Will give lasix 40mg  IV X 1 today; with anticipation of changing to PO demadex at discharge 6. Strict I's and O's, daily weight 7. Wean O2 as tolerated to maintain sats >92% 8. Cardiology on board, will follow rec's 2. Acute on CKD stage 3 1. Cr remains essentially stable now; will might need to acept at higher level of Cr in order to achieve desired diuresis 2. Follow BMET in am 3. Colitis due to C.Diff 1. Continue po vanc 2. No major events of diarrhea 3. Appetite still poor 4. Chronic endocarditis  1. Continue chronic amoxicillin 5. Hyperkalemia 1. Resolved 2. Close follow up of electrolytes  Code Status: DNR Family Communication: Pt and family at bedside Disposition Plan: Pending   Consultants:    Procedures:    Antibiotics:  PO Vanc  Amoxicillin  HPI/Subjective: Feeling better overall; reports breathing is stable. No fever.  Objective: Filed Vitals:   12/15/13 1011 12/15/13 1300 12/16/13 0546 12/16/13 1441  BP: 100/57 101/61 105/60 102/60  Pulse: 98 92 98 86  Temp:  97.3 F (36.3 C) 98 F (36.7 C) 97.4 F (36.3 C)  TempSrc:  Axillary Oral Oral  Resp:  18 18 18   Height:      Weight:   78 kg (171  lb 15.3 oz)   SpO2:  100% 94% 99%    Intake/Output Summary (Last 24 hours) at 12/16/13 1805 Last data filed at 12/16/13 0931  Gross per 24 hour  Intake    480 ml  Output      0 ml  Net    480 ml   Filed Weights   12/14/13 0722 12/15/13 0337 12/16/13 0546  Weight: 76.89 kg (169 lb 8.2 oz) 78.8 kg (173 lb 11.6 oz) 78 kg (171 lb 15.3 oz)    Exam:   General:  AAOX2; feeling better and in no acute distress; reports breathing is better.  Cardiovascular: regular, s1, s2; positive SEM  Respiratory: normal resp effort, no wheezing; bibasilar fine crackles  Abdomen: soft, nondistended; positive BS  Musculoskeletal: perfused, no clubbing; trace to 1+ edema  Data Reviewed: Basic Metabolic Panel:  Recent Labs Lab 12/12/13 1936 12/13/13 0545 12/14/13 0750 12/15/13 0416 12/16/13 0615  NA 137 140 137 136* 136*  K 5.7* 4.9 5.0 4.6 4.5  CL 102 104 104 100 102  CO2 19 23 19 21 19   GLUCOSE 88 93 76 86 76  BUN 21 24* 26* 28* 32*  CREATININE 1.63* 1.88* 1.92* 2.08* 2.15*  CALCIUM 9.0 8.9 8.9 8.8 8.9   Liver Function Tests:  Recent Labs Lab 12/15/13 1930  AST 25  ALT 8  ALKPHOS 164*  BILITOT 1.2  PROT 6.2  ALBUMIN 2.3*   CBC:  Recent Labs Lab 12/12/13  1936  WBC 6.1  HGB 12.9  HCT 40.3  MCV 94.6  PLT 76*   BNP (last 3 results)  Recent Labs  11/27/13 2111 12/04/13 1400 12/12/13 1946  PROBNP 7774.0* 6091.0* 13086.0*   CBG: No results found for this basename: GLUCAP,  in the last 168 hours  Recent Results (from the past 240 hour(s))  MRSA PCR SCREENING     Status: None   Collection Time    12/13/13  8:00 AM      Result Value Ref Range Status   MRSA by PCR NEGATIVE  NEGATIVE Final   Comment:            The GeneXpert MRSA Assay (FDA     approved for NASAL specimens     only), is one component of a     comprehensive MRSA colonization     surveillance program. It is not     intended to diagnose MRSA     infection nor to guide or     monitor treatment  for     MRSA infections.     Studies: No results found.  Scheduled Meds: . amoxicillin  500 mg Oral Q12H  . famotidine  20 mg Oral Daily  . ferrous sulfate  325 mg Oral BID WC  . furosemide  40 mg Intravenous Once  . hydroxychloroquine  200 mg Oral BID  . metoprolol tartrate  12.5 mg Oral BID  . saccharomyces boulardii  250 mg Oral BID  . sodium chloride  3 mL Intravenous Q12H  . vancomycin  125 mg Oral Q24H  . Warfarin - Pharmacist Dosing Inpatient   Does not apply q1800   Continuous Infusions:   Principal Problem:   Acute on chronic respiratory failure with hypoxemia Active Problems:   CKD (chronic kidney disease) stage 3, GFR 30-59 ml/min   Acute on chronic systolic heart failure   Colitis due to Clostridium difficile   Hypoxia   Hyperkalemia  Time spent: 1min  Aizen Duval, [facility] Pager [phone]. If 7PM-7AM, please contact night-coverage at [email or web address], password [facility] 12/16/2013, 6:05 PM  LOS: 4 days

## 2013-12-17 DIAGNOSIS — E43 Unspecified severe protein-calorie malnutrition: Secondary | ICD-10-CM

## 2013-12-17 DIAGNOSIS — Z7901 Long term (current) use of anticoagulants: Secondary | ICD-10-CM

## 2013-12-17 DIAGNOSIS — J8410 Pulmonary fibrosis, unspecified: Secondary | ICD-10-CM

## 2013-12-17 DIAGNOSIS — K219 Gastro-esophageal reflux disease without esophagitis: Secondary | ICD-10-CM

## 2013-12-17 DIAGNOSIS — M329 Systemic lupus erythematosus, unspecified: Secondary | ICD-10-CM

## 2013-12-17 DIAGNOSIS — J9620 Acute and chronic respiratory failure, unspecified whether with hypoxia or hypercapnia: Secondary | ICD-10-CM | POA: Diagnosis not present

## 2013-12-17 LAB — BASIC METABOLIC PANEL
Anion gap: 14 (ref 5–15)
BUN: 32 mg/dL — AB (ref 6–23)
CHLORIDE: 103 meq/L (ref 96–112)
CO2: 17 meq/L — AB (ref 19–32)
Calcium: 8.9 mg/dL (ref 8.4–10.5)
Creatinine, Ser: 1.98 mg/dL — ABNORMAL HIGH (ref 0.50–1.10)
GFR calc Af Amer: 27 mL/min — ABNORMAL LOW (ref >90)
GFR calc non Af Amer: 24 mL/min — ABNORMAL LOW (ref >90)
Glucose, Bld: 68 mg/dL — ABNORMAL LOW (ref 70–99)
Potassium: 4 mEq/L (ref 3.7–5.3)
Sodium: 134 mEq/L — ABNORMAL LOW (ref 137–147)

## 2013-12-17 LAB — PROTIME-INR
INR: 3.99 — ABNORMAL HIGH (ref 0.00–1.49)
Prothrombin Time: 38.9 seconds — ABNORMAL HIGH (ref 11.6–15.2)

## 2013-12-17 MED ORDER — GABAPENTIN 100 MG PO CAPS: 100.0000 mg | ORAL_CAPSULE | Freq: Every day | ORAL | Status: AC

## 2013-12-17 MED ORDER — WARFARIN SODIUM 2 MG PO TABS: 2.0000 mg | ORAL_TABLET | Freq: Every day | ORAL | Status: AC

## 2013-12-17 MED ORDER — TORSEMIDE 20 MG PO TABS: 20.0000 mg | ORAL_TABLET | Freq: Every day | ORAL | Status: AC

## 2013-12-17 MED ORDER — POTASSIUM CHLORIDE CRYS ER 10 MEQ PO TBCR: 20.0000 meq | EXTENDED_RELEASE_TABLET | Freq: Two times a day (BID) | ORAL | Status: AC

## 2013-12-17 MED ORDER — ENSURE PLUS PO LIQD: 237.0000 mL | Freq: Three times a day (TID) | ORAL | Status: AC

## 2013-12-17 NOTE — Progress Notes (Signed)
Subjective:  More awake and eating breakfast. Complains of generalized weakness no chest pain or shortness of breath. Appetite remains poor  Objective:  Vital Signs in the last 24 hours: Temp:  [97.4 F (36.3 C)-97.5 F (36.4 C)] 97.4 F (36.3 C) (09/24 0703) Pulse Rate:  [86-98] 98 (09/24 0703) Resp:  [17-18] 18 (09/24 0703) BP: (102-116)/(60-74) 116/74 mmHg (09/24 0703) SpO2:  [96 %-99 %] 98 % (09/24 0703) Weight:  [77.931 kg (171 lb 12.9 oz)] 77.931 kg (171 lb 12.9 oz) (09/24 0703)  Intake/Output from previous day: 09/23 0701 - 09/24 0700 In: 360 [P.O.:360] Out: -  Intake/Output from this shift:    Physical Exam: Exam unchanged  Lab Results: No results found for this basename: WBC, HGB, PLT,  in the last 72 hours  Recent Labs  12/16/13 0615 12/17/13 0558  NA 136* 134*  K 4.5 4.0  CL 102 103  CO2 19 17*  GLUCOSE 76 68*  BUN 32* 32*  CREATININE 2.15* 1.98*   No results found for this basename: TROPONINI, CK, MB,  in the last 72 hours Hepatic Function Panel  Recent Labs  12/15/13 1930  PROT 6.2  ALBUMIN 2.3*  AST 25  ALT 8  ALKPHOS 164*  BILITOT 1.2  BILIDIR 0.6*  IBILI 0.6   No results found for this basename: CHOL,  in the last 72 hours No results found for this basename: PROTIME,  in the last 72 hours  Imaging: Imaging results have been reviewed and No results found.  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

## 2013-12-17 NOTE — Progress Notes (Signed)
1630 Discharged to [facility] via [employer] . Son in attendance

## 2013-12-17 NOTE — Progress Notes (Signed)
1645 Attempted to give report  to [facility] but to no avail Will follow up

## 2013-12-17 NOTE — Discharge Summary (Signed)
Physician Discharge Summary  [name] MCN:[id number] DOB: [birth year] DOA: 12/12/2013  PCP: Hollace Kinnier, DO  Admit date: 12/12/2013 Discharge date: 12/17/2013  Time spent: >30 minutes  Recommendations for Outpatient Follow-up:  INR on 9/25 to follow coumadin level and adjust dose of future dosages BMET to follow electrolytes and renal failure in 1 week Please make sure patient has palliative care on board; if she failed rehab plan is for comfort measures.  Discharge Diagnoses:  Principal Problem:   Acute on chronic respiratory failure with hypoxemia Active Problems:   CKD (chronic kidney disease) stage 3, GFR 30-59 ml/min   Acute on chronic systolic heart failure   Colitis due to Clostridium difficile   Hypoxia   Hyperkalemia   Discharge Condition: stable. No CP or SOB. Patient still very weak, frail and with poor appetite. Will discharge to SNF for attempted rehab.  Diet recommendation: low sodium diet  Filed Weights   12/15/13 0337 12/16/13 0546 12/17/13 0703  Weight: 78.8 kg (173 lb 11.6 oz) 78 kg (171 lb 15.3 oz) 77.931 kg (171 lb 12.9 oz)    History of present illness:  73 y.o. female well known to our service, this will be her 5th admission since beginning of Aug. 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 She is brought in to the ED by daughter after patient was noted to be more SOB than her baseline at the rehab. Patient does have a baseline O2 requirement of 3-4L, however despite this she was desatting at the nursing home and was tachypneic. In Ed found with acute on chronic CHF exacerbation  Hospital Course:  Acute on chronic respiratory failure with hypoxia  1. Hypoxia improved with O2, currently at baseline on 3L 2. No pulm edema on CXR at discharge 3. Pt noted to have severe pulm HTN on recent  2d echo 4. Will now discharge on demadex 20 mg daily 5. Continue Strict I's and O's, daily weight and low sodium diet 6. Cardiology follow up in 1 week  Acute on CKD stage 3  1. Cr remains essentially stable now and improving; will might need to acept at higher level of Cr in order to achieve desired diuresis level 2. Follow BMET as an outpatient  Colitis due to C.Diff  1. Continue po vanc 2. No major events of diarrhea 3. Appetite still poor  Chronic endocarditis  1. Continue chronic amoxicillin  Deconditioning: will discharge to SNF for rehab. Palliative care to follow patient along in case she failed rehabilitation; will benefit of comfort care.  Protein calorie malnutrition: continue ensure  Lupus: continue plaquenil  Pulmonary fibrosis: continue oxygen supplementation. Amiodarone discontinues  Atrial fibrillation: will continue b-blockers and coumadin.  Hyperkalemia  1. Resolved 2. Close follow up of electrolytes   Procedures: See below for x-ray reports   Consultations:  Cardiology   Discharge Exam: Filed Vitals:   12/17/13 0703  BP: 116/74  Pulse: 98  Temp: 97.4 F (36.3 C)  Resp: 18   General: AAOX2; feeling better and in no acute distress; reports breathing is better.  Cardiovascular: regular, s1, s2; positive SEM Respiratory: normal resp effort, no wheezing; no frank crackles  Abdomen: soft, nondistended; positive BS  Musculoskeletal: perfused, no clubbing; trace to 1+ edema   Discharge Instructions You were cared for by a hospitalist during your hospital stay. If you have any questions  about your discharge medications or the care you received while you were in the hospital after you are discharged, you can call the unit and asked to speak with the hospitalist on call if the hospitalist that took care of you is not available. Once you are discharged, your primary care physician will handle any further medical issues. Please note that NO REFILLS for  any discharge medications will be authorized once you are discharged, as it is imperative that you return to your primary care physician (or establish a relationship with a primary care physician if you do not have one) for your aftercare needs so that they can reassess your need for medications and monitor your lab values.  Discharge Instructions   Diet - low sodium heart healthy    Complete by:  As directed      Discharge instructions    Complete by:  As directed   TAKE MEDICATIONS AS PRESCRIBED FOLLOW LOW SODIUM DIET DAILY WEIGHT PLEASE HAVE PALLIATIVE CARE FOLLOWING PATIENT AT DISCHARGE; IF PATIENT FAILED REHAB WOULD NEED TRANSITION TO COMFORT CARE          Current Discharge Medication List    START taking these medications   Details  ENSURE PLUS (ENSURE PLUS) LIQD Take 237 mLs by mouth 3 (three) times daily between meals. Refills: 0    torsemide (DEMADEX) 20 MG tablet Take 1 tablet (20 mg total) by mouth daily.      CONTINUE these medications which have CHANGED   Details  gabapentin (NEURONTIN) 100 MG capsule Take 1 capsule (100 mg total) by mouth at bedtime.    potassium chloride (K-DUR,KLOR-CON) 10 MEQ tablet Take 2 tablets (20 mEq total) by mouth 2 (two) times daily.    warfarin (COUMADIN) 2 MG tablet Take 1 tablet (2 mg total) by mouth daily at 6 PM. PATIENT WILL THEN NEED COUMADIN LEVEL CHECK AND FURTHER ADJUSTMENT TO BE DONE BY PHARMACY BASE ON HER INR LEVELS      CONTINUE these medications which have NOT CHANGED   Details  acetaminophen (TYLENOL) 325 MG tablet Take 2 tablets (650 mg total) by mouth every 6 (six) hours as needed for mild pain (or Fever >/= 101). Qty: 30 tablet, Refills: 1    amoxicillin (AMOXIL) 500 MG capsule Take 1 capsule (500 mg total) by mouth every 12 (twelve) hours.    famotidine (PEPCID) 20 MG tablet Take 1 tablet (20 mg total) by mouth 2 (two) times daily. Qty: 60 tablet, Refills: 3    Ferrous Fumarate (IRON) 18 MG TBCR Take 18 mg by  mouth 2 (two) times daily.    geriatric multivitamins-minerals (ELDERTONIC/GEVRABON) ELIX Take 15 mLs by mouth 2 (two) times daily.    hydroxychloroquine (PLAQUENIL) 200 MG tablet Take 200 mg by mouth 2 (two) times daily.     metoprolol tartrate (LOPRESSOR) 12.5 mg TABS tablet Take 0.5 tablets (12.5 mg total) by mouth 2 (two) times daily. Qty: 60 tablet, Refills: 1    saccharomyces boulardii (FLORASTOR) 250 MG capsule Take 1 capsule (250 mg total) by mouth 2 (two) times daily. Qty: 60 capsule, Refills: 3   Associated Diagnoses: Loss of weight; Antibiotic-associated diarrhea    vancomycin (VANCOCIN) 50 mg/mL oral solution Take 125 mg PO BID for 7 days, followed by 125 mg daily for 7 days followed by 125 mg every other day for 7 days Qty: 50 mL, Refills: 0      STOP taking these medications     amiodarone (PACERONE) 200 MG tablet  furosemide (LASIX) 20 MG tablet        Allergies  Allergen Reactions  . Ceftriaxone Itching and Rash    RASH BUT TOLERATED AMPICILLIN AND IMIPENEM WITHOUT PROBLEMS  . Codeine Nausea Only   Follow-up Information   Follow up with Clent Demark, MD. Schedule an appointment as soon as possible for a visit in 1 week.   Specialty:  Cardiology   Contact information:   [address] [place] [id number] [phone]       The results of significant diagnostics from this hospitalization (including imaging, microbiology, ancillary and laboratory) are listed below for reference.    Significant Diagnostic Studies: Dg Chest 2 View  12/12/2013   CLINICAL DATA:  Short of breath.  EXAM: CHEST  2 VIEW  COMPARISON:  12/04/2013.  FINDINGS: Stable changes from previous cardiac surgery. Cardiac silhouette is mildly enlarged. No mediastinal or hilar masses.  Mild chronic interstitial prominence. No lung consolidation or edema. No pleural effusion or pneumothorax.  Bony thorax is demineralized but grossly intact.  IMPRESSION: No acute  cardiopulmonary disease.   Electronically Signed   By: Lajean Manes M.D.   On: 12/12/2013 21:13   Dg Abd 1 View  11/28/2013   CLINICAL DATA:  Nausea and vomiting.  EXAM: ABDOMEN - 1 VIEW  COMPARISON:  CT abdomen and pelvis 08/03/2013  FINDINGS: The bowel gas pattern is normal. No radio-opaque calculi or other significant radiographic abnormality are seen. Residual contrast material in the bladder.  IMPRESSION: Nonobstructive bowel gas pattern.   Electronically Signed   By: Lucienne Capers M.D.   On: 11/28/2013 05:06   Ct Head Wo Contrast  12/02/2013   CLINICAL DATA:  Altered mental status, confusion, weakness  EXAM: CT HEAD WITHOUT CONTRAST  TECHNIQUE: Contiguous axial images were obtained from the base of the skull through the vertex without intravenous contrast.  COMPARISON:  MRI brain dated 11/14/2013  FINDINGS: No evidence of parenchymal hemorrhage or extra-axial fluid collection. No mass lesion, mass effect, or midline shift.  No CT evidence of acute infarction.  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  Extensive small vessel ischemic changes. Intracranial atherosclerosis.  Global cortical atrophy.  No ventriculomegaly.  The visualized paranasal sinuses are essentially clear. The mastoid air cells are unopacified.  No evidence of calvarial fracture.  IMPRESSION: No evidence of acute intracranial abnormality.  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  Atrophy with extensive small vessel ischemic changes and intracranial atherosclerosis.   Electronically Signed   By: Julian Hy M.D.   On: 12/02/2013 20:26   Ct Angio Chest Pe W/cm &/or Wo Cm  11/27/2013   CLINICAL DATA:  Respiratory distress.  Chest pain.  EXAM: CT ANGIOGRAPHY CHEST WITH CONTRAST  TECHNIQUE: Multidetector CT imaging of the chest was performed using the standard protocol during bolus administration of intravenous contrast. Multiplanar CT image reconstructions and MIPs were obtained to evaluate the vascular  anatomy.  CONTRAST:  44mL OMNIPAQUE IOHEXOL 350 MG/ML SOLN  COMPARISON:  Chest radiograph November 27, 2013 and CT of the chest July 11, 2012.  FINDINGS: Adequate pulmonary arterial contrast opacification. Main pulmonary artery is enlarged, 3.9 cm. Nonocclusive segmental to subsegmental right lower lobe pulmonary eccentric arterial filling defect, appearing adherent and chronic though new from prior imaging (axial 136/306).  Heart size is moderate to severely enlarged, with dilated right ventricle. Reflux of contrast into the liver consistent with right heart failure. Aortic annular calcifications. No right heart strain. Coronary artery calcifications.  No pericardial fluid collections. Thoracic aorta appears normal in course and caliber, mild calcific atherosclerosis. Limited assessment for lymphadenopathy due to bolus timing.  Patchy ground-glass opacities, small to moderate right and small left pleural effusion. Interlobular septal thickening in the lung bases. Tracheobronchial tree is patent and midline. No pneumothorax.  16 mm gallstone. Soft tissues are nonsuspicious. Status post median sternotomy. Mild thoracic spondylosis without destructive bony lesions.  Review of the MIP images confirms the above findings.  IMPRESSION: No convincing evidence of acute pulmonary embolism; nonocclusive right lower lobe segmental to subsegmental pulmonary embolus appears chronic. No right heart strain. Similar enlarged pulmonary arteries suggesting sequelae of pulmonary arterial hypertension.  Cardiomegaly with evidence of right heart failure. Findings of acute and chronic pulmonary edema with small to moderate right and small left pleural effusions.   Electronically Signed   By: Elon Alas   On: 11/27/2013 23:54   US Abdomen Complete  11/28/2013   CLINICAL DATA:  Nausea/vomiting  EXAM: ULTRASOUND ABDOMEN COMPLETE  COMPARISON:  11/07/2013  FINDINGS: Gallbladder:  Underdistended with mild secondary prominence of the  gallbladder wall. 2.1 cm gallstone. No pericholecystic fluid. Murphy's sign could not be assessed due to medication.  Common bile duct:  Diameter: 5 mm.  Liver:  No focal lesion identified. Within normal limits in parenchymal echogenicity. Trace perihepatic fluid.  IVC:  No abnormality visualized.  Pancreas:  Poorly visualized.  Spleen:  Enlarged, measuring 14.4 cm.  Right Kidney:  Length: 10.0 cm. Echogenic renal parenchyma with cortical atrophy. 4 mm lower pole calculus. 2.4 x 2.0 x 2.0 cm lower pole cyst. No hydronephrosis.  Left Kidney:  Length: 12.9 cm. Echogenic renal parenchyma. 8 x 9 x 8 mm upper pole cyst. No hydronephrosis.  Abdominal aorta:  No aneurysm visualized.  Other findings:  Small right pleural effusion.  IMPRESSION: Cholelithiasis, without associated findings suggest acute cholecystitis.  Splenomegaly.  Echogenic renal parenchyma, suggesting medical renal disease. Bilateral renal cysts. 4 mm right lower pole renal calculus, without hydronephrosis.  Small right pleural effusion.   Electronically Signed   By: Julian Hy M.D.   On: 11/28/2013 07:23   Dg Chest Port 1 View  12/04/2013   CLINICAL DATA:  Shortness of breath.  EXAM: PORTABLE CHEST - 1 VIEW  COMPARISON:  11/28/2013 and CT angio chest 11/27/2013 and earlier priors.  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 Stable chronic moderate cardiac enlargement. Heavy mitral annulus calcifications are noted. There is diffuse bilateral interstitial prominence, similar to recent chest radiographs of 11/15/2013 and 11/16/2013. Pulmonary vascularity appears slightly prominent. No consolidation. No visible pleural effusion. Negative for pneumothorax.  Right upper extremity PICC appears stable, projecting over the mid SVC.  IMPRESSION: Stable chronic moderate cardiomegaly. Diffuse interstitial prominence may reflect interstitial pulmonary edema.   Electronically Signed   By: Curlene Dolphin M.D.   On: 12/04/2013 12:37   Dg Chest  Port 1 View  11/28/2013   CLINICAL DATA:  Labored breathing. History of congestive heart failure, hypertension, lupus  EXAM: PORTABLE CHEST - 1 VIEW  COMPARISON:  11/27/2013; 11/16/2013; chest CT -11/27/2013  FINDINGS: Grossly unchanged enlarged cardiac silhouette and mediastinal contours post median sternotomy. Exuberant calcifications within the mitral valve annulus. Interval placement of right upper extremity approach PICC line with tip projected over the superior cavoatrial junction. Chronic findings of pulmonary venous congestion. Minimally improved aeration of lungs with persistent bibasilar opacities and linear heterogeneous opacities in the peripheral aspect the right mid lung. No new focal airspace opacities  chronic trace right-sided effusion is suspected. No pneumothorax. Unchanged bones.  IMPRESSION: 1. Similar findings of cardiomegaly and pulmonary venous congestion without acute cardiopulmonary disease. 2. Unchanged trace right-sided pleural effusion. 3. Minimally improved aeration of the lungs with persistent bibasilar opacities, likely atelectasis.   Electronically Signed   By: Sandi Mariscal M.D.   On: 11/28/2013 12:11   Dg Chest Port 1 View  11/27/2013   CLINICAL DATA:  Chest pain.  EXAM: PORTABLE CHEST - 1 VIEW  COMPARISON:  11/16/2013  FINDINGS: Stable cardiomegaly status post prior CABG. There is some scarring and atelectasis at the right lung base. No overt pulmonary edema or pleural fluid. No pneumothorax.  IMPRESSION: Atelectasis and scarring at the right lung base. Stable cardiomegaly.   Electronically Signed   By: Aletta Edouard M.D.   On: 11/27/2013 21:38    Microbiology: Recent Results (from the past 240 hour(s))  MRSA PCR SCREENING     Status: None   Collection Time    12/13/13  8:00 AM      Result Value Ref Range Status   MRSA by PCR NEGATIVE  NEGATIVE Final   Comment:            The GeneXpert MRSA Assay (FDA     approved for NASAL specimens     only), is one component of a      comprehensive MRSA colonization     surveillance program. It is not     intended to diagnose MRSA     infection nor to guide or     monitor treatment for     MRSA infections.     Labs: Basic Metabolic Panel:  Recent Labs Lab 12/13/13 0545 12/14/13 0750 12/15/13 0416 12/16/13 0615 12/17/13 0558  NA 140 137 136* 136* 134*  K 4.9 5.0 4.6 4.5 4.0  CL 104 104 100 102 103  CO2 23 19 21 19  17*  GLUCOSE 93 76 86 76 68*  BUN 24* 26* 28* 32* 32*  CREATININE 1.88* 1.92* 2.08* 2.15* 1.98*  CALCIUM 8.9 8.9 8.8 8.9 8.9   Liver Function Tests:  Recent Labs Lab 12/15/13 1930  AST 25  ALT 8  ALKPHOS 164*  BILITOT 1.2  PROT 6.2  ALBUMIN 2.3*   CBC:  Recent Labs Lab 12/12/13 1936  WBC 6.1  HGB 12.9  HCT 40.3  MCV 94.6  PLT 76*   BNP (last 3 results)  Recent Labs  11/27/13 2111 12/04/13 1400 12/12/13 1946  PROBNP 7774.0* 6091.0* 13086.0*    Signed:  Barton Dubois  [facility] 12/17/2013, 12:18 PM

## 2013-12-21 ENCOUNTER — Encounter: Payer: Self-pay | Admitting: Adult Health

## 2013-12-24 ENCOUNTER — Ambulatory Visit: Payer: Medicare Other | Admitting: Internal Medicine

## 2013-12-27 NOTE — Progress Notes (Signed)
Clinical Social Worker facilitated patient discharge back to [facility], contacted patient's son Ronalee Belts and contacted the facility to confirm patient discharge plans.  Clinical information faxed to facility and familypatient are  agreeable with plan. CSW arranged ambulance transport via [facility] to [facility] . RN to call report prior to discharge. Clinical Social Worker will sign off for now as social work intervention is no longer needed. Please consult [place] again if new need arises.  Kendell Bane, [place]

## 2013-12-28 ENCOUNTER — Ambulatory Visit: Payer: Medicare Other | Admitting: Internal Medicine

## 2014-01-07 ENCOUNTER — Non-Acute Institutional Stay (SKILLED_NURSING_FACILITY): Payer: Medicare Other | Admitting: Internal Medicine

## 2014-01-07 DIAGNOSIS — I5032 Chronic diastolic (congestive) heart failure: Secondary | ICD-10-CM

## 2014-01-07 DIAGNOSIS — N183 Chronic kidney disease, stage 3 unspecified: Secondary | ICD-10-CM

## 2014-01-07 DIAGNOSIS — E876 Hypokalemia: Secondary | ICD-10-CM

## 2014-01-07 DIAGNOSIS — I480 Paroxysmal atrial fibrillation: Secondary | ICD-10-CM

## 2014-01-09 DIAGNOSIS — I5032 Chronic diastolic (congestive) heart failure: Secondary | ICD-10-CM | POA: Insufficient documentation

## 2014-01-09 DIAGNOSIS — E876 Hypokalemia: Secondary | ICD-10-CM | POA: Insufficient documentation

## 2014-01-09 NOTE — Progress Notes (Signed)
         PROGRESS NOTE  DATE: 01/07/2014  FACILITY: Nursing Home Location: [facility]  LEVEL OF CARE: SNF (31)  Routine Visit  CHIEF COMPLAINT:  Manage CHF, atrial fibrillation and chronic kidney disease stage III  HISTORY OF PRESENT ILLNESS:  REASSESSMENT OF ONGOING PROBLEM(S):  ATRIAL FIBRILLATION: the patients atrial fibrillation remains stable.  The patient denies DOE, tachycardia, orthopnea, transient neurological sx, pedal edema, palpitations, & PNDs.  No complications noted from the medications currently being used.  CHF:The patient does not relate significant weight changes, denies sob, DOE, orthopnea, PNDs, pedal edema, palpitations or chest pain.  CHF remains stable.  No complications form the medications being used.  CHRONIC KIDNEY DISEASE: The patient's chronic kidney disease remains stable.  Patient denies increasing lower extremity swelling or confusion. Last BUN and creatinine are: 27, 1.8 in 10-15.  PAST MEDICAL HISTORY : Reviewed.  No changes/see problem list  CURRENT MEDICATIONS: Reviewed per MAR/see medication list  REVIEW OF SYSTEMS:  GENERAL: no change in appetite, no fatigue, no weight changes, no fever, chills or weakness RESPIRATORY: no cough, SOB, DOE, wheezing, hemoptysis CARDIAC: no chest pain, edema or palpitations GI: no abdominal pain, diarrhea, constipation, heart burn, nausea or vomiting  PHYSICAL EXAMINATION  VS:  See VS section  GENERAL: no acute distress, normal body habitus EYES: conjunctivae normal, sclerae normal, normal eye lids NECK: supple, trachea midline, no neck masses, no thyroid tenderness, no thyromegaly LYMPHATICS: no LAN in the neck, no supraclavicular LAN RESPIRATORY: breathing is even & unlabored, BS CTAB CARDIAC: RRR, no murmur,no extra heart sounds, no edema GI: abdomen soft, normal BS, no masses, no tenderness, no hepatomegaly, no splenomegaly PSYCHIATRIC: the patient is alert & oriented to  person, affect & behavior appropriate  LABS/RADIOLOGY:  10-15 BUN 27, creatinine 1.8 otherwise BMP normal 9-15 platelets 74 otherwise CBC normal  ASSESSMENT/PLAN:  Atrial fibrillation-rate controlled CHF-compensated Chronic kidney disease stage III-stable Hypokalemia-Will controlled Thrombocytopenia-patient asymptomatic GERD-continue PPI Anemia of chronic kidney disease-hemoglobin normalized. Discontinue iron. Check liver profile  CPT CODE: 93716  Cartel Mauss Y Chynna Buerkle, MD [facility] [phone]

## 2014-01-19 ENCOUNTER — Non-Acute Institutional Stay (SKILLED_NURSING_FACILITY): Payer: Medicare Other | Admitting: Adult Health

## 2014-01-19 ENCOUNTER — Encounter: Payer: Self-pay | Admitting: Adult Health

## 2014-01-19 DIAGNOSIS — I480 Paroxysmal atrial fibrillation: Secondary | ICD-10-CM

## 2014-01-19 DIAGNOSIS — N183 Chronic kidney disease, stage 3 unspecified: Secondary | ICD-10-CM

## 2014-01-19 DIAGNOSIS — T826XXD Infection and inflammatory reaction due to cardiac valve prosthesis, subsequent encounter: Secondary | ICD-10-CM

## 2014-01-19 DIAGNOSIS — K219 Gastro-esophageal reflux disease without esophagitis: Secondary | ICD-10-CM

## 2014-01-19 DIAGNOSIS — G2581 Restless legs syndrome: Secondary | ICD-10-CM

## 2014-01-19 DIAGNOSIS — I38 Endocarditis, valve unspecified: Secondary | ICD-10-CM

## 2014-01-19 DIAGNOSIS — I5032 Chronic diastolic (congestive) heart failure: Secondary | ICD-10-CM

## 2014-01-19 NOTE — Progress Notes (Signed)
This encounter was created in error - please disregard.

## 2014-01-19 NOTE — Progress Notes (Signed)
Patient ID: [name], female   DOB: 06-15-1940, 73 y.o.   MRN: [id number]              PROGRESS NOTE  DATE: 01/19/2014   FACILITY: [facility]  LEVEL OF CARE: SNF (31)  Discharge Notes  HISTORY OF PRESENT ILLNESS:   This is a 73 year old female who is for discharge home with Home health PT, OT, Nursing and Home health aide. DME: wheelchair 18" X 18" with elevating leg rests and wheelchair cushion and semi-electric hospital bed. She has been re-admitted to [facility] on 12/17/13 from [facility] with acute on chronic respiratory failure and hypoxemia.  Patient was admitted to this facility for short-term rehabilitation after the patient's recent hospitalization.  Patient has completed SNF rehabilitation and therapy has cleared the patient for discharge.  Reassessment of ongoing problem(s):  CHF:The patient does not relate significant weight changes, denies palpitations or chest pain.  CHF remains stable.  No complications form the medications being used.  ATRIAL FIBRILLATION: the patients atrial fibrillation remains stable.  The patient denies DOE, tachycardia, orthopnea, transient neurological sx, pedal edema, palpitations, & PNDs.  No complications noted from the medications currently being used.  GERD: pt's GERD is stable.  Denies ongoing heartburn, abd. Pain, nausea or vomiting.  Currently on a PPI & tolerates it without any adverse reactions.   Past Medical History  Diagnosis Date  . Coronary artery disease   . Atrial fibrillation     Now NSR  . GERD (gastroesophageal reflux disease)   . Hyperlipidemia   . 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 Anemia   . Angina   . Hypertension   . Blood transfusion     "related to bad case of bronchitis once; 1 yr ago given due to GIB" (12/17/2012)  . H/O hiatal hernia   . Lupus     "that's compromised her lungs somewhat" (12/17/2012)  . Insomnia   . Endocarditis   . Prosthetic valve  endocarditis   . Enterococcal bacteremia   . Heart murmur   . CHF (congestive heart failure)   . Pneumonia     "once or twice" (12/17/2012)  . Chronic bronchitis     "used to get it once/yr; hasn't had it since ~ 1986" (12/17/2012)  . Shortness of breath     "all the times sometimes" (12/17/2012)  . On home oxygen therapy     "2L prn; always at night" (12/17/2012)  . Lower GI bleed ~ 01/2012  . Arthritis     "hands" (12/17/2012)  . 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 Right bundle branch block 11/07/2013     Reviewed.  No changes/see problem list  CURRENT MEDICATIONS: Reviewed per MAR/see medication list    Allergies  Allergen Reactions  . Ceftriaxone Itching and Rash    RASH BUT TOLERATED AMPICILLIN AND IMIPENEM WITHOUT PROBLEMS  . Codeine Nausea Only     REVIEW OF SYSTEMS:  GENERAL: no change in appetite, no fatigue, no weight changes, no fever, chills or weakness RESPIRATORY: no cough, wheezing, hemoptysis CARDIAC: no chest pain, or palpitations, +edema GI: no abdominal pain, diarrhea, constipation, heart burn, nausea or vomiting  PHYSICAL EXAMINATION  GENERAL: no acute distress, normal body habitus EYES: conjunctivae normal, sclerae normal, normal eye lids NECK: supple, trachea midline, no neck masses, no thyroid tenderness, no thyromegaly LYMPHATICS: no LAN in the neck, no supraclavicular  LAN RESPIRATORY: breathing is even & unlabored, BS CTAB CARDIAC: Irregularly irregular, no murmur,no extra heart sounds, BLE edema 2+ GI: abdomen soft, normal BS, no masses, no tenderness, no hepatomegaly, no splenomegaly EXTREMITIES:  Able to move all 4 extremities PSYCHIATRIC: the patient is alert & oriented to person, affect & behavior appropriate  LABS/RADIOLOGY: 01/07/14  total protein 5.9 albumin 2.5 total bilirubin 2.6 direct bilirubin 1.3  alp 124 AST 20 ALT5 01/01/14  right lower extremity duplex venous ultrasound is negative for DVT 01/04/14   sodium 139 potassium 3.5 glucose 76 BUN 27 creatinine 1.8 calcium 9.2 Labs reviewed: Basic Metabolic Panel:  Recent Labs  11/07/13 1340  12/15/13 0416 12/16/13 0615 12/17/13 0558  NA  --   < > 136* 136* 134*  K  --   < > 4.6 4.5 4.0  CL  --   < > 100 102 103  CO2  --   < > 21 19 17*  GLUCOSE  --   < > 86 76 68*  BUN  --   < > 28* 32* 32*  CREATININE  --   < > 2.08* 2.15* 1.98*  CALCIUM  --   < > 8.8 8.9 8.9  MG 2.2  --   --   --   --   PHOS 4.4  --   --   --   --   < > = values in this interval not displayed. Liver Function Tests:  Recent Labs  11/27/13 2108 11/28/13 0550 12/15/13 1930  AST 30 26 25   ALT 17 15 8   ALKPHOS 225* 184* 164*  BILITOT 1.4* 1.5* 1.2  PROT 7.0 6.1 6.2  ALBUMIN 2.8* 2.5* 2.3*   CBC:  Recent Labs  11/14/13 0923  11/27/13 2108 11/28/13 0550  12/03/13 0600 12/04/13 1400 12/12/13 1936  WBC 14.9*  < > 7.0 4.9  < > 3.4* 4.1 6.1  NEUTROABS 11.7*  --  4.5 3.1  --   --   --   --   HGB 12.5  < > 13.0 11.5*  < > 11.0* 11.8* 12.9  HCT 39.3  < > 40.4 34.3*  < > 35.0* 36.3 40.3  MCV 97.0  < > 100.5* 96.6  < > 99.4 96.8 94.6  PLT 145*  < > 117* 99*  < > 111* 117* 76*  < > = values in this interval not displayed.  Lipid Panel:  Recent Labs  04/27/13 1101 11/15/13 0340  HDL 46 38*   Cardiac Enzymes:  Recent Labs  09/12/13 1122 11/07/13 0140  11/28/13 0550 11/28/13 0900 11/28/13 1525  CKTOTAL 87 156  --   --   --   --   TROPONINI <0.30  --   < > <0.30 <0.30 <0.30  < > = values in this interval not displayed.   CBG:  Recent Labs  11/07/13 0828 11/07/13 0933 11/28/13 0255  GLUCAP 50* 138* 90   Dg Chest 2 View  12/12/2013   CLINICAL DATA:  Short of breath.  EXAM: CHEST  2 VIEW  COMPARISON:  12/04/2013.  FINDINGS: Stable changes from previous cardiac surgery. Cardiac silhouette is mildly enlarged. No mediastinal or hilar masses.  Mild chronic interstitial prominence. No lung consolidation or edema. No pleural effusion or  pneumothorax.  Bony thorax is demineralized but grossly intact.  IMPRESSION: No acute cardiopulmonary disease.   Electronically Signed   By: Lajean Manes M.D.   On: 12/12/2013 21:13  EXAM: CT HEAD WITHOUT CONTRAST  TECHNIQUE: Contiguous axial images were obtained from the base of the skull through the vertex without intravenous contrast.   COMPARISON:  Head and cervical spine CT 1039 hr the same day and earlier.   FINDINGS: Stable paranasal sinuses and mastoids. No acute osseous abnormality identified. Stable orbit and scalp soft tissues.   Calcified atherosclerosis at the skull base. 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 Left thalamic and right caudate lacunar infarcts also are stable. Patchy and confluent cerebral white matter hypodensity also has not significantly changed. Scattered small lacunar infarcts in both cerebellar hemispheres also appear stable. No midline shift, mass effect, or evidence of intracranial mass lesion. No acute intracranial hemorrhage identified. No evidence of cortically based acute infarction identified. No suspicious intracranial vascular hyperdensity.   IMPRESSION: 1. Stable non contrast CT appearance of the brain. No new intracranial abnormality identified. 2. Chronic ischemic disease detailed above. Study discussed by telephone with Dr. Wallie Char on 11/14/2013 at 1718 hrs.   EXAM: MRI HEAD WITHOUT AND WITH CONTRAST   MRA HEAD WITHOUT CONTRAST   MRA NECK WITHOUT AND WITH CONTRAST   TECHNIQUE: Multiplanar, multiecho pulse sequences of the brain and surrounding structures were obtained without and with intravenous contrast. Angiographic images of the Circle of Willis were obtained using MRA technique without intravenous contrast. Angiographic images of the neck were obtained using MRA technique without and with intravenous contrast. Carotid stenosis measurements (when applicable)  are obtained utilizing NASCET criteria, using the distal internal carotid diameter as the denominator.   CONTRAST:  52mL MULTIHANCE GADOBENATE DIMEGLUMINE 529 MG/ML IV SOLN   COMPARISON:  CT head 11/14/2013   FINDINGS: MRI HEAD FINDINGS   Small area of acute infarct in the left frontal cortex above the sylvian fissure. No other acute infarct identified.   Generalized atrophy. Chronic ischemic changes throughout the white matter. 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 Mild chronic ischemia in the cerebellum bilaterally. Chronic infarct in the left thalamus. Scattered areas of chronic micro hemorrhage bilaterally most likely due to chronic hypertension   Postcontrast imaging reveals no enhancing mass lesion.   MRA HEAD FINDINGS   Both vertebral arteries are patent to the basilar. Right AICA is patent with proximal stenosis. Left PICA patent. Basilar patent. Superior cerebellar arteries show mild disease proximally. Posterior cerebral arteries are patent bilaterally with mild atherosclerotic disease. No significant stenosis   Internal carotid artery widely patent bilaterally. Anterior and middle cerebral arteries are patent bilaterally. Moderate disease in right middle cerebral artery branches.   Negative for cerebral aneurysm.   MRA NECK FINDINGS   Suboptimal image quality.  Suboptimal timing of the injection.   The internal carotid artery appears patent without significant stenosis bilaterally. Both vertebral arteries are patent without significant stenosis.   IMPRESSION: Subcentimeter focus of acute infarct in the left frontal cortex.   Mild intracranial atherosclerotic disease most prominent in the right middle cerebral artery.   Suboptimal MRA of the neck without significant carotid or vertebral stenosis in the neck. EXAM: MRI HEAD WITHOUT AND WITH CONTRAST   MRA HEAD WITHOUT CONTRAST   MRA NECK WITHOUT AND WITH CONTRAST   TECHNIQUE: Multiplanar,  multiecho pulse sequences of the brain and surrounding structures were obtained without and with intravenous contrast. Angiographic images of the Circle of Willis were obtained using MRA technique without intravenous contrast. Angiographic images of the neck were obtained using MRA technique without and with intravenous contrast. Carotid stenosis measurements (when applicable) are obtained utilizing  NASCET criteria, using the distal internal carotid diameter as the denominator.   CONTRAST:  69mL MULTIHANCE GADOBENATE DIMEGLUMINE 529 MG/ML IV SOLN   COMPARISON:  CT head 11/14/2013   FINDINGS: MRI HEAD FINDINGS   Small area of acute infarct in the left frontal cortex above the sylvian fissure. No other acute infarct identified.   Generalized atrophy. Chronic ischemic changes throughout the white matter. 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 Mild chronic ischemia in the cerebellum bilaterally. Chronic infarct in the left thalamus. Scattered areas of chronic micro hemorrhage bilaterally most likely due to chronic hypertension   Postcontrast imaging reveals no enhancing mass lesion.   MRA HEAD FINDINGS   Both vertebral arteries are patent to the basilar. Right AICA is patent with proximal stenosis. Left PICA patent. Basilar patent. Superior cerebellar arteries show mild disease proximally. Posterior cerebral arteries are patent bilaterally with mild atherosclerotic disease. No significant stenosis   Internal carotid artery widely patent bilaterally. Anterior and middle cerebral arteries are patent bilaterally. Moderate disease in right middle cerebral artery branches.   Negative for cerebral aneurysm.   MRA NECK FINDINGS   Suboptimal image quality.  Suboptimal timing of the injection.   The internal carotid artery appears patent without significant stenosis bilaterally. Both vertebral arteries are patent without significant stenosis.   IMPRESSION: Subcentimeter  focus of acute infarct in the left frontal cortex.   Mild intracranial atherosclerotic disease most prominent in the right middle cerebral artery.   Suboptimal MRA of the neck without significant carotid or vertebral stenosis in the neck. EXAM: MRI HEAD WITHOUT AND WITH CONTRAST   MRA HEAD WITHOUT CONTRAST   MRA NECK WITHOUT AND WITH CONTRAST   TECHNIQUE: Multiplanar, multiecho pulse sequences of the brain and surrounding structures were obtained without and with intravenous contrast. Angiographic images of the Circle of Willis were obtained using MRA technique without intravenous contrast. Angiographic images of the neck were obtained using MRA technique without and with intravenous contrast. Carotid stenosis measurements (when applicable) are obtained utilizing NASCET criteria, using the distal internal carotid diameter as the denominator.   CONTRAST:  47mL MULTIHANCE GADOBENATE DIMEGLUMINE 529 MG/ML IV SOLN   COMPARISON:  CT head 11/14/2013   FINDINGS: MRI HEAD FINDINGS   Small area of acute infarct in the left frontal cortex above the sylvian fissure. No other acute infarct identified.   Generalized atrophy. Chronic ischemic changes throughout the white matter. 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 Mild chronic ischemia in the cerebellum bilaterally. Chronic infarct in the left thalamus. Scattered areas of chronic micro hemorrhage bilaterally most likely due to chronic hypertension   Postcontrast imaging reveals no enhancing mass lesion.   MRA HEAD FINDINGS   Both vertebral arteries are patent to the basilar. Right AICA is patent with proximal stenosis. Left PICA patent. Basilar patent. Superior cerebellar arteries show mild disease proximally. Posterior cerebral arteries are patent bilaterally with mild atherosclerotic disease. No significant stenosis   Internal carotid artery widely patent bilaterally. Anterior and middle cerebral arteries are patent  bilaterally. Moderate disease in right middle cerebral artery branches.   Negative for cerebral aneurysm.   MRA NECK FINDINGS   Suboptimal image quality.  Suboptimal timing of the injection.   The internal carotid artery appears patent without significant stenosis bilaterally. Both vertebral arteries are patent without significant stenosis.   IMPRESSION: Subcentimeter focus of acute infarct in the left frontal cortex.   Mild intracranial atherosclerotic disease most prominent in the right middle cerebral artery.   Suboptimal  MRA of the neck without significant carotid or vertebral stenosis in the neck.   EXAM: CT ANGIOGRAPHY CHEST WITH CONTRAST   TECHNIQUE: Multidetector CT imaging of the chest was performed using the standard protocol during bolus administration of intravenous contrast. Multiplanar CT image reconstructions and MIPs were obtained to evaluate the vascular anatomy.   CONTRAST:  75mL OMNIPAQUE IOHEXOL 350 MG/ML SOLN   COMPARISON:  Chest radiograph November 27, 2013 and CT of the chest July 11, 2012.   FINDINGS: Adequate pulmonary arterial contrast opacification. Main pulmonary artery is enlarged, 3.9 cm. Nonocclusive segmental to subsegmental right lower lobe pulmonary eccentric arterial filling defect, appearing adherent and chronic though new from prior imaging (axial 136/306).   Heart size is moderate to severely enlarged, with dilated right ventricle. Reflux of contrast into the liver consistent with right heart failure. Aortic annular calcifications. No right heart strain. Coronary artery calcifications. No pericardial fluid collections. Thoracic aorta appears normal in course and caliber, mild calcific atherosclerosis. Limited assessment for lymphadenopathy due to bolus timing.   Patchy ground-glass opacities, small to moderate right and small left pleural effusion. Interlobular septal thickening in the lung bases. Tracheobronchial tree is  patent and midline. No pneumothorax.   16 mm gallstone. Soft tissues are nonsuspicious. Status post median sternotomy. Mild thoracic spondylosis without destructive bony lesions.   Review of the MIP images confirms the above findings.   IMPRESSION: No convincing evidence of acute pulmonary embolism; nonocclusive right lower lobe segmental to subsegmental pulmonary embolus appears chronic. No right heart strain. Similar enlarged pulmonary arteries suggesting sequelae of pulmonary arterial hypertension.   Cardiomegaly with evidence of right heart failure. Findings of acute and chronic pulmonary edema with small to moderate right and small left pleural effusions.   EXAM: ABDOMEN - 1 VIEW   COMPARISON:  CT abdomen and pelvis 08/03/2013   FINDINGS: The bowel gas pattern is normal. No radio-opaque calculi or other significant radiographic abnormality are seen. Residual contrast material in the bladder.   IMPRESSION: Nonobstructive bowel gas pattern.   EXAM: ULTRASOUND ABDOMEN COMPLETE   COMPARISON:  11/07/2013   FINDINGS: Gallbladder:   Underdistended with mild secondary prominence of the gallbladder wall. 2.1 cm gallstone. No pericholecystic fluid. Murphy's sign could not be assessed due to medication.   Common bile duct:   Diameter: 5 mm.   Liver:   No focal lesion identified. Within normal limits in parenchymal echogenicity. Trace perihepatic fluid.   IVC:   No abnormality visualized.   Pancreas:   Poorly visualized.   Spleen:   Enlarged, measuring 14.4 cm.   Right Kidney:   Length: 10.0 cm. Echogenic renal parenchyma with cortical atrophy. 4 mm lower pole calculus. 2.4 x 2.0 x 2.0 cm lower pole cyst. No hydronephrosis.   Left Kidney:   Length: 12.9 cm. Echogenic renal parenchyma. 8 x 9 x 8 mm upper pole cyst. No hydronephrosis.   Abdominal aorta:   No aneurysm visualized.   Other findings:   Small right pleural effusion.    IMPRESSION: Cholelithiasis, without associated findings suggest acute cholecystitis.   Splenomegaly.   Echogenic renal parenchyma, suggesting medical renal disease. Bilateral renal cysts. 4 mm right lower pole renal calculus, without hydronephrosis.   Small right pleural effusion. EXAM: CT HEAD WITHOUT CONTRAST   TECHNIQUE: Contiguous axial images were obtained from the base of the skull through the vertex without intravenous contrast.   COMPARISON:  MRI brain dated 11/14/2013   FINDINGS: No evidence of parenchymal hemorrhage or extra-axial fluid collection. No  mass lesion, mass effect, or midline shift.   No CT evidence of acute infarction.   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   Extensive small vessel ischemic changes. Intracranial atherosclerosis.   Global cortical atrophy.  No ventriculomegaly.   The visualized paranasal sinuses are essentially clear. The mastoid air cells are unopacified.   No evidence of calvarial fracture.   IMPRESSION: No evidence of acute intracranial abnormality.   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   Atrophy with extensive small vessel ischemic changes and intracranial atherosclerosis. EXAM: PORTABLE CHEST - 1 VIEW   COMPARISON:  11/28/2013 and CT angio chest 11/27/2013 and earlier priors.   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 Stable chronic moderate cardiac enlargement. Heavy mitral annulus calcifications are noted. There is diffuse bilateral interstitial prominence, similar to recent chest radiographs of 11/15/2013 and 11/16/2013. Pulmonary vascularity appears slightly prominent. No consolidation. No visible pleural effusion. Negative for pneumothorax.   Right upper extremity PICC appears stable, projecting over the mid SVC.   IMPRESSION: Stable chronic moderate cardiomegaly. Diffuse interstitial prominence may reflect interstitial pulmonary  edema.   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  Patient will receive home health PT, OT, Nursing and home health aide.  DME provided: Wheelchair 18' X 18" with elevating leg rests and wheelchair cushion and semi-electric hospital bed  Total discharge time: Greater than 30 minutes  Discharge time involved coordination of the discharge process with social worker, nursing staff and therapy department. Medical justification for home health services/DME verified.  CPT CODE: 46286  MEDINA-VARGAS,Myeshia Fojtik, [facility] [phone]

## 2014-01-25 DIAGNOSIS — Z952 Presence of prosthetic heart valve: Secondary | ICD-10-CM

## 2014-01-25 DIAGNOSIS — I38 Endocarditis, valve unspecified: Secondary | ICD-10-CM

## 2014-01-25 DIAGNOSIS — I5032 Chronic diastolic (congestive) heart failure: Secondary | ICD-10-CM

## 2014-01-25 DIAGNOSIS — I482 Chronic atrial fibrillation: Secondary | ICD-10-CM

## 2014-01-31 ENCOUNTER — Other Ambulatory Visit: Payer: Self-pay | Admitting: Adult Health

## 2014-02-04 ENCOUNTER — Ambulatory Visit: Payer: Medicare Other | Admitting: Nurse Practitioner

## 2014-02-05 ENCOUNTER — Telehealth: Payer: Self-pay | Admitting: *Deleted

## 2014-02-05 ENCOUNTER — Encounter (HOSPITAL_COMMUNITY): Payer: Self-pay | Admitting: Emergency Medicine

## 2014-02-05 ENCOUNTER — Inpatient Hospital Stay (HOSPITAL_COMMUNITY)
Admission: EM | Admit: 2014-02-05 | Discharge: 2014-02-23 | DRG: 682 | Disposition: E | Payer: Medicare Other | Attending: Pulmonary Disease | Admitting: Pulmonary Disease

## 2014-02-05 ENCOUNTER — Emergency Department (HOSPITAL_COMMUNITY): Payer: Medicare Other

## 2014-02-05 DIAGNOSIS — N289 Disorder of kidney and ureter, unspecified: Secondary | ICD-10-CM

## 2014-02-05 DIAGNOSIS — M3213 Lung involvement in systemic lupus erythematosus: Secondary | ICD-10-CM | POA: Diagnosis present

## 2014-02-05 DIAGNOSIS — R680 Hypothermia, not associated with low environmental temperature: Secondary | ICD-10-CM | POA: Diagnosis present

## 2014-02-05 DIAGNOSIS — M329 Systemic lupus erythematosus, unspecified: Secondary | ICD-10-CM | POA: Diagnosis present

## 2014-02-05 DIAGNOSIS — Z8249 Family history of ischemic heart disease and other diseases of the circulatory system: Secondary | ICD-10-CM | POA: Diagnosis not present

## 2014-02-05 DIAGNOSIS — Z825 Family history of asthma and other chronic lower respiratory diseases: Secondary | ICD-10-CM | POA: Diagnosis not present

## 2014-02-05 DIAGNOSIS — J8410 Pulmonary fibrosis, unspecified: Secondary | ICD-10-CM | POA: Diagnosis present

## 2014-02-05 DIAGNOSIS — Z952 Presence of prosthetic heart valve: Secondary | ICD-10-CM

## 2014-02-05 DIAGNOSIS — E162 Hypoglycemia, unspecified: Secondary | ICD-10-CM | POA: Diagnosis present

## 2014-02-05 DIAGNOSIS — K219 Gastro-esophageal reflux disease without esophagitis: Secondary | ICD-10-CM | POA: Diagnosis present

## 2014-02-05 DIAGNOSIS — Z6826 Body mass index (BMI) 26.0-26.9, adult: Secondary | ICD-10-CM

## 2014-02-05 DIAGNOSIS — Z9981 Dependence on supplemental oxygen: Secondary | ICD-10-CM

## 2014-02-05 DIAGNOSIS — Z885 Allergy status to narcotic agent status: Secondary | ICD-10-CM | POA: Diagnosis not present

## 2014-02-05 DIAGNOSIS — I4510 Unspecified right bundle-branch block: Secondary | ICD-10-CM | POA: Diagnosis present

## 2014-02-05 DIAGNOSIS — E872 Acidosis: Secondary | ICD-10-CM | POA: Diagnosis present

## 2014-02-05 DIAGNOSIS — D649 Anemia, unspecified: Secondary | ICD-10-CM | POA: Diagnosis present

## 2014-02-05 DIAGNOSIS — I129 Hypertensive chronic kidney disease with stage 1 through stage 4 chronic kidney disease, or unspecified chronic kidney disease: Secondary | ICD-10-CM | POA: Diagnosis present

## 2014-02-05 DIAGNOSIS — J99 Respiratory disorders in diseases classified elsewhere: Secondary | ICD-10-CM | POA: Diagnosis present

## 2014-02-05 DIAGNOSIS — Z7901 Long term (current) use of anticoagulants: Secondary | ICD-10-CM

## 2014-02-05 DIAGNOSIS — N179 Acute kidney failure, unspecified: Principal | ICD-10-CM | POA: Diagnosis present

## 2014-02-05 DIAGNOSIS — I2510 Atherosclerotic heart disease of native coronary artery without angina pectoris: Secondary | ICD-10-CM | POA: Diagnosis present

## 2014-02-05 DIAGNOSIS — Z66 Do not resuscitate: Secondary | ICD-10-CM | POA: Diagnosis present

## 2014-02-05 DIAGNOSIS — R0602 Shortness of breath: Secondary | ICD-10-CM | POA: Diagnosis present

## 2014-02-05 DIAGNOSIS — R630 Anorexia: Secondary | ICD-10-CM | POA: Diagnosis present

## 2014-02-05 DIAGNOSIS — K449 Diaphragmatic hernia without obstruction or gangrene: Secondary | ICD-10-CM | POA: Diagnosis present

## 2014-02-05 DIAGNOSIS — J9601 Acute respiratory failure with hypoxia: Secondary | ICD-10-CM | POA: Diagnosis present

## 2014-02-05 DIAGNOSIS — Z800 Family history of malignant neoplasm of digestive organs: Secondary | ICD-10-CM

## 2014-02-05 DIAGNOSIS — N189 Chronic kidney disease, unspecified: Secondary | ICD-10-CM | POA: Diagnosis present

## 2014-02-05 DIAGNOSIS — I429 Cardiomyopathy, unspecified: Secondary | ICD-10-CM | POA: Diagnosis present

## 2014-02-05 DIAGNOSIS — R9431 Abnormal electrocardiogram [ECG] [EKG]: Secondary | ICD-10-CM

## 2014-02-05 DIAGNOSIS — E875 Hyperkalemia: Secondary | ICD-10-CM | POA: Diagnosis present

## 2014-02-05 DIAGNOSIS — Z888 Allergy status to other drugs, medicaments and biological substances status: Secondary | ICD-10-CM

## 2014-02-05 DIAGNOSIS — R531 Weakness: Secondary | ICD-10-CM

## 2014-02-05 DIAGNOSIS — Z95828 Presence of other vascular implants and grafts: Secondary | ICD-10-CM

## 2014-02-05 DIAGNOSIS — I509 Heart failure, unspecified: Secondary | ICD-10-CM | POA: Diagnosis present

## 2014-02-05 DIAGNOSIS — I482 Chronic atrial fibrillation: Secondary | ICD-10-CM | POA: Diagnosis present

## 2014-02-05 DIAGNOSIS — R601 Generalized edema: Secondary | ICD-10-CM

## 2014-02-05 LAB — CBC WITH DIFFERENTIAL/PLATELET
BASOS ABS: 0.1 10*3/uL (ref 0.0–0.1)
BASOS PCT: 1 % (ref 0–1)
Eosinophils Absolute: 0 10*3/uL (ref 0.0–0.7)
Eosinophils Relative: 0 % (ref 0–5)
HCT: 39.4 % (ref 36.0–46.0)
Hemoglobin: 13.1 g/dL (ref 12.0–15.0)
Lymphocytes Relative: 25 % (ref 12–46)
Lymphs Abs: 1.7 10*3/uL (ref 0.7–4.0)
MCH: 30.7 pg (ref 26.0–34.0)
MCHC: 33.2 g/dL (ref 30.0–36.0)
MCV: 92.3 fL (ref 78.0–100.0)
Monocytes Absolute: 1.1 10*3/uL — ABNORMAL HIGH (ref 0.1–1.0)
Monocytes Relative: 16 % — ABNORMAL HIGH (ref 3–12)
NEUTROS ABS: 4 10*3/uL (ref 1.7–7.7)
NEUTROS PCT: 58 % (ref 43–77)
PLATELETS: 107 10*3/uL — AB (ref 150–400)
RBC: 4.27 MIL/uL (ref 3.87–5.11)
RDW: 18.1 % — AB (ref 11.5–15.5)
WBC: 6.8 10*3/uL (ref 4.0–10.5)

## 2014-02-05 LAB — MRSA PCR SCREENING: MRSA BY PCR: NEGATIVE

## 2014-02-05 LAB — I-STAT CHEM 8, ED
BUN: 49 mg/dL — ABNORMAL HIGH (ref 6–23)
Calcium, Ion: 1.12 mmol/L — ABNORMAL LOW (ref 1.13–1.30)
Chloride: 110 mEq/L (ref 96–112)
Creatinine, Ser: 5.5 mg/dL — ABNORMAL HIGH (ref 0.50–1.10)
GLUCOSE: 45 mg/dL — AB (ref 70–99)
HCT: 43 % (ref 36.0–46.0)
HEMOGLOBIN: 14.6 g/dL (ref 12.0–15.0)
POTASSIUM: 7.4 meq/L — AB (ref 3.7–5.3)
Sodium: 135 mEq/L — ABNORMAL LOW (ref 137–147)
TCO2: 15 mmol/L (ref 0–100)

## 2014-02-05 LAB — COMPREHENSIVE METABOLIC PANEL
ALK PHOS: 189 U/L — AB (ref 39–117)
ALT: 11 U/L (ref 0–35)
ANION GAP: 23 — AB (ref 5–15)
AST: 43 U/L — ABNORMAL HIGH (ref 0–37)
Albumin: 2.5 g/dL — ABNORMAL LOW (ref 3.5–5.2)
BILIRUBIN TOTAL: 2.7 mg/dL — AB (ref 0.3–1.2)
BUN: 47 mg/dL — AB (ref 6–23)
CO2: 13 meq/L — AB (ref 19–32)
Calcium: 10.1 mg/dL (ref 8.4–10.5)
Chloride: 101 mEq/L (ref 96–112)
Creatinine, Ser: 5.12 mg/dL — ABNORMAL HIGH (ref 0.50–1.10)
GFR, EST AFRICAN AMERICAN: 9 mL/min — AB (ref >90)
GFR, EST NON AFRICAN AMERICAN: 7 mL/min — AB (ref >90)
Glucose, Bld: 45 mg/dL — ABNORMAL LOW (ref 70–99)
SODIUM: 137 meq/L (ref 137–147)
Total Protein: 7.1 g/dL (ref 6.0–8.3)

## 2014-02-05 LAB — SAMPLE TO BLOOD BANK

## 2014-02-05 LAB — PRO B NATRIURETIC PEPTIDE: Pro B Natriuretic peptide (BNP): 39165 pg/mL — ABNORMAL HIGH (ref 0–450)

## 2014-02-05 LAB — DIGOXIN LEVEL: DIGOXIN LVL: 2.1 ng/mL — AB (ref 0.8–2.0)

## 2014-02-05 LAB — I-STAT TROPONIN, ED: Troponin i, poc: 0.02 ng/mL (ref 0.00–0.08)

## 2014-02-05 MED ORDER — SODIUM BICARBONATE 8.4 % IV SOLN
50.0000 meq | Freq: Once | INTRAVENOUS | Status: AC
  Administered 2014-02-05: 50 meq via INTRAVENOUS
  Filled 2014-02-05: qty 50

## 2014-02-05 MED ORDER — FUROSEMIDE 10 MG/ML IJ SOLN
60.0000 mg | Freq: Once | INTRAMUSCULAR | Status: DC
  Filled 2014-02-05: qty 6

## 2014-02-05 MED ORDER — MORPHINE SULFATE 2 MG/ML IJ SOLN
2.0000 mg | INTRAMUSCULAR | Status: DC | PRN
  Administered 2014-02-05 – 2014-02-06 (×4): 2 mg via INTRAVENOUS
  Filled 2014-02-05 (×4): qty 1

## 2014-02-05 MED ORDER — ONDANSETRON HCL 4 MG/2ML IJ SOLN: 4.0000 mg | Freq: Four times a day (QID) | INTRAMUSCULAR | Status: DC | PRN

## 2014-02-05 MED ORDER — STERILE WATER FOR INJECTION IV SOLN
INTRAVENOUS | Status: DC
  Administered 2014-02-05 – 2014-02-06 (×2): via INTRAVENOUS
  Filled 2014-02-05 (×3): qty 850

## 2014-02-05 MED ORDER — CALCIUM CHLORIDE 10 % IV SOLN
1.0000 g | Freq: Once | INTRAVENOUS | Status: AC
  Administered 2014-02-05: 1 g via INTRAVENOUS
  Filled 2014-02-05: qty 10

## 2014-02-05 MED ORDER — SODIUM BICARBONATE 8.4 % IV SOLN
50.0000 meq | Freq: Once | INTRAVENOUS | Status: AC
Start: 2014-02-05 — End: 2014-02-05
  Administered 2014-02-05: 50 meq via INTRAVENOUS
  Filled 2014-02-05: qty 50

## 2014-02-05 MED ORDER — SODIUM POLYSTYRENE SULFONATE 15 GM/60ML PO SUSP
30.0000 g | Freq: Once | ORAL | Status: DC
  Filled 2014-02-05: qty 120

## 2014-02-05 MED ORDER — SODIUM CHLORIDE 0.9 % IV SOLN: 250.0000 mL | INTRAVENOUS | Status: DC | PRN

## 2014-02-05 MED ORDER — SODIUM CHLORIDE 0.9 % IV SOLN
1.0000 g | Freq: Once | INTRAVENOUS | Status: DC
  Filled 2014-02-05: qty 10

## 2014-02-05 MED ORDER — INSULIN ASPART 100 UNIT/ML ~~LOC~~ SOLN
10.0000 [IU] | Freq: Once | SUBCUTANEOUS | Status: AC
  Administered 2014-02-05: 10 [IU] via INTRAVENOUS
  Filled 2014-02-05: qty 1

## 2014-02-05 MED ORDER — DEXTROSE 50 % IV SOLN
1.0000 | Freq: Once | INTRAVENOUS | Status: AC
  Administered 2014-02-05: 50 mL via INTRAVENOUS
  Filled 2014-02-05: qty 50

## 2014-02-05 NOTE — ED Provider Notes (Signed)
CSN: [id number]     Arrival date & time 02/02/2014  1437 History   First MD Initiated Contact with Patient 02/14/2014 1448     Chief Complaint  Patient presents with  . Failure To Thrive   Level V caveat for age  (Consider location/radiation/quality/duration/timing/severity/associated sxs/prior Treatment) HPI  Patient reports she's been short of breath for several days. She denies any chest pain or change in her urination. Her family is not here with her however EMS states they were told she has been getting weak with failure to thrive of the past 2 weeks. EMS reports her initial blood pressure was 75 systolic.  PCP Dr Wynelle Cleveland, Dr [facility] Cardiologist Dr Terrence Dupont  Past Medical History  Diagnosis Date  . Coronary artery disease   . Atrial fibrillation     Now NSR  . GERD (gastroesophageal reflux disease)   . Hyperlipidemia   . 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 Anemia   . Angina   . Hypertension   . Blood transfusion     "related to bad case of bronchitis once; 1 yr ago given due to GIB" (12/17/2012)  . H/O hiatal hernia   . Lupus     "that's compromised her lungs somewhat" (12/17/2012)  . Insomnia   . Endocarditis   . Prosthetic valve endocarditis   . Enterococcal bacteremia   . Heart murmur   . CHF (congestive heart failure)   . Pneumonia     "once or twice" (12/17/2012)  . Chronic bronchitis     "used to get it once/yr; hasn't had it since ~ 1986" (12/17/2012)  . Shortness of breath     "all the times sometimes" (12/17/2012)  . On home oxygen therapy     "2L prn; always at night" (12/17/2012)  . Lower GI bleed ~ 01/2012  . Arthritis     "hands" (12/17/2012)  . 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 Right bundle branch block 11/07/2013   Past Surgical History  Procedure Laterality Date  . Givens capsule study  04/09/2011    Procedure: GIVENS CAPSULE STUDY;  Surgeon: Beryle Beams, MD;  Location: [facility];  Service:  Endoscopy;  Laterality: N/A;  . Tee without cardioversion  02/06/2012    Procedure: TRANSESOPHAGEAL ECHOCARDIOGRAM (TEE);  Surgeon: Birdie Riddle, MD;  Location[facility] ENDOSCOPY;  Service: Cardiovascular;  Laterality: N/A;  . Esophagogastroduodenoscopy  02/12/2012    Procedure: ESOPHAGOGASTRODUODENOSCOPY (EGD);  Surgeon: Beryle Beams, MD;  Location: [facility] ENDOSCOPY;  Service: Endoscopy;  Laterality: N/A;  . Tee without cardioversion N/A 07/15/2012    Procedure: TRANSESOPHAGEAL ECHOCARDIOGRAM (TEE);  Surgeon: Birdie Riddle, MD;  Location: [facility] ENDOSCOPY;  Service: Cardiovascular;  Laterality: N/A;  . Breast lumpectomy Left 1977    "benign" (12/17/2012)  . Tonsillectomy  1940's  . Coronary artery bypass graft  2006  . 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   . Cardiac catheterization      "probably 2-3" (12/17/2012)  . Coronary angioplasty with stent placement      "several put in over the years" (12/17/2012)  . Cataract extraction w/ intraocular lens  implant, bilateral Bilateral 11/2000  . Peripherally inserted central catheter insertion Right 12/15/2012    "upper arm" (12/17/2012   Family History  Problem Relation Age of Onset  . Heart disease Mother     CHF  . Mental illness Father     suicide  .  Cancer Sister     pancreatic cancer  . COPD Brother    History  Substance Use Topics  . Smoking status: Never Smoker   . Smokeless tobacco: Never Used  . Alcohol Use: No   Pt states she lives at home with her daughter  OB History    No data available     Review of Systems  Unable to perform ROS: Age      Allergies  Ceftriaxone and Codeine  Home Medications   Prior to Admission medications   Medication Sig Start Date End Date Taking? Authorizing Provider  acetaminophen (TYLENOL) 325 MG tablet Take 2 tablets (650 mg total) by mouth every 6 (six) hours as needed for mild pain (or Fever >/= 101). 12/07/13   Kelvin Cellar, MD  amoxicillin (AMOXIL)  500 MG capsule Take 1 capsule (500 mg total) by mouth every 12 (twelve) hours. 09/15/13   Barton Dubois, MD  ENSURE PLUS (ENSURE PLUS) LIQD Take 237 mLs by mouth 3 (three) times daily between meals. 12/17/13   Barton Dubois, MD  famotidine (PEPCID) 20 MG tablet Take 1 tablet (20 mg total) by mouth 2 (two) times daily. 10/27/13   Estill Dooms, MD  Ferrous Fumarate (IRON) 18 MG TBCR Take 18 mg by mouth 2 (two) times daily.    Historical Provider, MD  gabapentin (NEURONTIN) 100 MG capsule Take 1 capsule (100 mg total) by mouth at bedtime. 12/17/13   Barton Dubois, MD  geriatric multivitamins-minerals (ELDERTONIC/GEVRABON) ELIX Take 15 mLs by mouth 2 (two) times daily.    Historical Provider, MD  hydroxychloroquine (PLAQUENIL) 200 MG tablet Take 200 mg by mouth 2 (two) times daily.     Historical Provider, MD  metoprolol tartrate (LOPRESSOR) 12.5 mg TABS tablet Take 0.5 tablets (12.5 mg total) by mouth 2 (two) times daily. 12/07/13   Kelvin Cellar, MD  potassium chloride (K-DUR,KLOR-CON) 10 MEQ tablet Take 2 tablets (20 mEq total) by mouth 2 (two) times daily. 12/17/13   Barton Dubois, MD  saccharomyces boulardii (FLORASTOR) 250 MG capsule Take 1 capsule (250 mg total) by mouth 2 (two) times daily. 08/31/13   Tiffany L Reed, DO  torsemide (DEMADEX) 20 MG tablet Take 1 tablet (20 mg total) by mouth daily. 12/17/13   Barton Dubois, MD  vancomycin (VANCOCIN) 50 mg/mL oral solution Take 125 mg PO BID for 7 days, followed by 125 mg daily for 7 days followed by 125 mg every other day for 7 days 12/07/13   Kelvin Cellar, MD  warfarin (COUMADIN) 1 MG tablet APPOINTMENT OVERDUE, 1 by mouth daily 02/01/14   Tiffany L Reed, DO  warfarin (COUMADIN) 2 MG tablet Take 1 tablet (2 mg total) by mouth daily at 6 PM. PATIENT WILL THEN NEED COUMADIN LEVEL CHECK AND FURTHER ADJUSTMENT TO BE DONE BY PHARMACY BASE ON HER INR LEVELS 12/18/13   Barton Dubois, MD   BP 101/50 mmHg  Pulse 86  Temp(Src) 97.5 F (36.4 C) (Oral)  Resp 22   SpO2 100%  Vital signs normal   Physical Exam  Constitutional: She is oriented to person, place, and time. She appears well-developed and well-nourished.  Non-toxic appearance. She does not appear ill. No distress.  HENT:  Head: Normocephalic and atraumatic.  Right Ear: External ear normal.  Left Ear: External ear normal.  Nose: Nose normal. No mucosal edema or rhinorrhea.  Mouth/Throat: Oropharynx is clear and moist and mucous membranes are normal. No dental abscesses or uvula swelling.  Eyes: Conjunctivae and EOM are  normal. Pupils are equal, round, and reactive to light.  Neck: Normal range of motion and full passive range of motion without pain. Neck supple.  Cardiovascular: Normal rate, regular rhythm and normal heart sounds.  Exam reveals no gallop and no friction rub.   No murmur heard. Pulmonary/Chest: Breath sounds normal. She is in respiratory distress. She has no wheezes. She has no rhonchi. She has no rales. She exhibits no tenderness and no crepitus.  Patient appears to have some shortness of breath  Abdominal: Soft. Normal appearance and bowel sounds are normal. She exhibits no distension. There is no tenderness. There is no rebound and no guarding.  Musculoskeletal: Normal range of motion. She exhibits edema. She exhibits no tenderness.  Patient is noted to have generalized edema of her legs and her arms, she also appears to have edema of her face with marks from her oxygen tubing left on her skin.  Neurological: She is alert and oriented to person, place, and time. She has normal strength. No cranial nerve deficit.  Skin: Skin is warm, dry and intact. No rash noted. No erythema. There is pallor.  Psychiatric: She has a normal mood and affect. Her speech is normal and behavior is normal. Her mood appears not anxious.  Nursing note and vitals reviewed.   ED Course  Procedures (including critical care time)  Medications  furosemide (LASIX) injection 60 mg (0 mg  Intravenous Hold 02/17/2014 1613)  sodium polystyrene (KAYEXALATE) 15 GM/60ML suspension 30 g (not administered)  calcium chloride injection 1 g (1 g Intravenous Given 02/04/2014 1513)  sodium bicarbonate injection 50 mEq (50 mEq Intravenous Given 02/12/2014 1537)  dextrose 50 % solution 50 mL (50 mLs Intravenous Given 02/17/2014 1535)  insulin aspart (novoLOG) injection 10 Units (10 Units Intravenous Given 02/17/2014 1536)   Review of patient's pill bottles shows she has bottles with potassium and digoxin. When I review her x-ray I was suspicious that she was having hyperkalemia and possible digoxin toxicity. I decided Code STEMI was not called until I discussed her with her cardiologist.  15:01 patient was discussed with Dr. Terrence Dupont who has reviewed her EKG. He agrees with me this does not appear to be an acute STEMI plus he states she's not a candidate for catheterization.states he will consult.  Patient was given IV calcium and on the monitor her complexes appear to get more narrow. The rest of the hyperkalemia protocol was done which included IV bicarb, IV insulin and D50. She was given oral kayexalate.   Pt's CBG was repeated b/o initial hypoglycemia on her istat  15:15 Dr Halford Chessman, critical care, will have someone come admit, wants ABG and nephrology consult.   16:21 Dr Mercy Moore, nephrology, aware of consult  1649 at end of my shift foley is still pending, UA has not been obtained yet, repeat CBG has not been done yet. Her third EKG is pending. However on the monitor her QRS complex appears narrowed were. Patient is resting comfortably in her stretcher.  Labs Review Results for orders placed or performed during the hospital encounter of 02/02/2014  Comprehensive metabolic panel  Result Value Ref Range   Sodium 137 137 - 147 mEq/L   Potassium PENDING 3.7 - 5.3 mEq/L   Chloride 101 96 - 112 mEq/L   CO2 13 (L) 19 - 32 mEq/L   Glucose, Bld 45 (L) 70 - 99 mg/dL   BUN 47 (H) 6 - 23 mg/dL    Creatinine, Ser 5.12 (H) 0.50 - 1.10 mg/dL  Calcium 10.1 8.4 - 10.5 mg/dL   Total Protein 7.1 6.0 - 8.3 g/dL   Albumin 2.5 (L) 3.5 - 5.2 g/dL   AST 43 (H) 0 - 37 U/L   ALT 11 0 - 35 U/L   Alkaline Phosphatase 189 (H) 39 - 117 U/L   Total Bilirubin 2.7 (H) 0.3 - 1.2 mg/dL   GFR calc non Af Amer 7 (L) >90 mL/min   GFR calc Af Amer 9 (L) >90 mL/min   Anion gap 23 (H) 5 - 15  CBC with Differential  Result Value Ref Range   WBC 6.8 4.0 - 10.5 K/uL   RBC 4.27 3.87 - 5.11 MIL/uL   Hemoglobin 13.1 12.0 - 15.0 g/dL   HCT 39.4 36.0 - 46.0 %   MCV 92.3 78.0 - 100.0 fL   MCH 30.7 26.0 - 34.0 pg   MCHC 33.2 30.0 - 36.0 g/dL   RDW 18.1 (H) 11.5 - 15.5 %   Platelets 107 (L) 150 - 400 K/uL   Neutrophils Relative % 58 43 - 77 %   Neutro Abs 4.0 1.7 - 7.7 K/uL   Lymphocytes Relative 25 12 - 46 %   Lymphs Abs 1.7 0.7 - 4.0 K/uL   Monocytes Relative 16 (H) 3 - 12 %   Monocytes Absolute 1.1 (H) 0.1 - 1.0 K/uL   Eosinophils Relative 0 0 - 5 %   Eosinophils Absolute 0.0 0.0 - 0.7 K/uL   Basophils Relative 1 0 - 1 %   Basophils Absolute 0.1 0.0 - 0.1 K/uL  Digoxin level  Result Value Ref Range   Digoxin Level 2.1 (H) 0.8 - 2.0 ng/mL  I-stat Chem 8, ED  Result Value Ref Range   Sodium 135 (L) 137 - 147 mEq/L   Potassium 7.4 (HH) 3.7 - 5.3 mEq/L   Chloride 110 96 - 112 mEq/L   BUN 49 (H) 6 - 23 mg/dL   Creatinine, Ser 5.50 (H) 0.50 - 1.10 mg/dL   Glucose, Bld 45 (L) 70 - 99 mg/dL   Calcium, Ion 1.12 (L) 1.13 - 1.30 mmol/L   TCO2 15 0 - 100 mmol/L   Hemoglobin 14.6 12.0 - 15.0 g/dL   HCT 43.0 36.0 - 46.0 %   Comment NOTIFIED PHYSICIAN   I-stat troponin, ED  Result Value Ref Range   Troponin i, poc 0.02 0.00 - 0.08 ng/mL   Comment 3          Sample to Blood Bank  Result Value Ref Range   Blood Bank Specimen SAMPLE AVAILABLE FOR TESTING    Sample Expiration Feb 24, 2014     Laboratory interpretation all normal except hyperkalemia on i-STAT 8, worsening renal failure (creatinine on  September 24 was 4.08), metabolic acidosis    Imaging Review Dg Chest Portable 1 View  02/19/2014   CLINICAL DATA:  Difficulty breathing  EXAM: PORTABLE CHEST - 1 VIEW  COMPARISON:  December 12, 2013  FINDINGS: There is no edema or consolidation. There is mild atelectatic change in each mid lung region. Heart is enlarged with pulmonary vascularity within normal limits. No adenopathy. Patient is status post internal mammary bypass grafting. There is calcification in the mitral annulus. No bone lesions. There is degenerative change in each shoulder.  IMPRESSION: Mild atelectatic change in each mid lung. No edema or consolidation. Stable cardiac prominence.   Electronically Signed   By: Lowella Grip M.D.   On: 02/05/2014 15:30     EKG Interpretation  #1 Date/Time:  Friday  February 05 2014 14:38:57 EST Ventricular Rate:  89 PR Interval:    QRS Duration: 159 QT Interval:  525 QTC Calculation: 639 R Axis:   -133 Text Interpretation:  Age not entered, assumed to be  73 years old for  purpose of ECG interpretation Atrial fibrillation Since last tracing 12 Dec 2013 Right bundle branch block Anteroseptal infarct, acute ?  electrolyte abnormality such as hyperkalemia Confirmed by Kylina Vultaggio  MD-I, Amahri Dengel  ([id number]) on 02/14/2014 2:47:59 PM      EKG Interpretation  Date/Time:  Friday February 05 2014 15:16:15 EST Ventricular Rate:  98 PR Interval:    QRS Duration: 154 QT Interval:  442 QTC Calculation: 564 R Axis:   -163 Text Interpretation:  Atrial fibrillation Nonspecific intraventricular conduction delay Anteroseptal infarct, acute (LAD) QRS is less wide than earlier today Confirmed by Suzie Vandam  MD-I, Jeramey Lanuza ([id number]) on 02/03/2014 4:45:03 PM      # 3  Date: 02/21/2014  Rate: 79  Rhythm: atrial fibrillation  QRS Axis: right  Intervals: QT prolonged  ST/T Wave abnormalities: nonspecific ST/T changes  Conduction Disutrbances:right bundle branch block  Narrative Interpretation:   Old EKG  Reviewed: unchanged QRS interval remains 156    MDM   Final diagnoses:  SOB (shortness of breath)  S/P PICC central line placement  Weakness generalized  Anasarca  Acute on chronic renal insufficiency  Hyperkalemia  Abnormal EKG    Plan admission  Rolland Porter, MD, FACEP    CRITICAL CARE Performed by: Rolland Porter L Total critical care time: 39 min Critical care time was exclusive of separately billable procedures and treating other patients. Critical care was necessary to treat or prevent imminent or life-threatening deterioration. Critical care was time spent personally by me on the following activities: development of treatment plan with patient and/or surrogate as well as nursing, discussions with consultants, evaluation of patient's response to treatment, examination of patient, obtaining history from patient or surrogate, ordering and performing treatments and interventions, ordering and review of laboratory studies, ordering and review of radiographic studies, pulse oximetry and re-evaluation of patient's condition.     Janice Norrie, MD 01/28/2014 [phone]

## 2014-02-05 NOTE — Telephone Encounter (Signed)
Left message for [name] at [facility] to do labs on patient to return call.

## 2014-02-05 NOTE — ED Notes (Signed)
Pt arrived from home by [facility] with c/o generalized weakness and failure to thrive x 2 weeks. Denies any pain. Skin is pale unsure if normal for pt. EMS initial BP 75systolic, last MD-47/09. Unable to establish IV access. Denies n/v. Denies blood in stool.

## 2014-02-05 NOTE — H&P (Signed)
HPI: Pt is unable to provide any history. This history is obtained from daughters.  83 F with extensive PMH and general decline in health over several years with an accelerated decline in past several months. She was brought to ED by family with 3 days of fatigue, weakness, AMS, anorexia.  Family reports no fever, cough, CP, N/V/D, dysuria. They have not observed any change in her bladder habits. In the ED she was noted to be hypothermic, hypoxemic, acidotic and hyperkalemic. She received treatment for hyperkalemia and PCCM was called.    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 Marital Status: Divorced    Spouse Name: N/A    Number of Children: N/A  . Years of Education: N/A   Occupational History  . Not on file.   Social History Main Topics  . Smoking status: Never Smoker   . Smokeless tobacco: Never Used  . Alcohol Use: No  . Drug Use: No  . Sexual Activity: No   Other Topics Concern  . Not on file   Social History Narrative   Family History is noncontributory  ROS: As per HPI. Otherwise unavailable   EXAM: Filed Vitals:   02/18/2014 1500 02/15/2014 1545 02/22/2014 1630 02/08/2014 1700  BP: 92/69 104/82 104/57 114/61  Pulse: 82  84   Temp:      TempSrc:      Resp: 30 26 16 24   SpO2: 82%        Very frail, pale somnolent, tachypneic on NRB mask NCAT, PERRL, EOMI No JVD Chest clear anteriorly, minimal bibasilar crackles IRIR, controlled rate, + systolic M abd soft, NT, diminished BS Ext cool, cyanotic, diminished distal pulses Neuro: diffusely weak, MAEs  CBC    Component Value Date/Time   WBC 6.8 02/03/2014 1521   WBC 4.8 09/10/2013 1455   RBC 4.27 02/19/2014 1521   RBC 3.58* 11/08/2013 1140   RBC 4.51 09/10/2013 1455   HGB 14.6 01/26/2014 1538   HCT 43.0 01/30/2014 1538    PLT 107* 01/27/2014 1521   MCV 92.3 01/24/2014 1521   MCH 30.7 02/14/2014 1521   MCH 31.0 09/10/2013 1455   MCHC 33.2 02/18/2014 1521   MCHC 32.6 09/10/2013 1455   RDW 18.1* 02/19/2014 1521   RDW 16.0* 09/10/2013 1455   LYMPHSABS 1.7 01/24/2014 1521   LYMPHSABS 1.2 09/10/2013 1455   MONOABS 1.1* 02/20/2014 1521   EOSABS 0.0 02/08/2014 1521   EOSABS 0.1 09/10/2013 1455   BASOSABS 0.1 02/08/2014 1521   BASOSABS 0.0 09/10/2013 1455    BMET    Component Value Date/Time   NA 135* 02/01/2014 1538   NA 139 09/28/2013 1551   K 7.4* 02/13/2014 1538   CL 110 02/04/2014 1538   CO2 13* 02/07/2014 1521   GLUCOSE 45* 02/18/2014 1538   GLUCOSE 91 09/28/2013 1551   BUN 49* 02/02/2014 1538   BUN 25 09/28/2013 1551   CREATININE 5.50* 01/27/2014 1538   CREATININE 0.98 05/25/2013 1558   CALCIUM 10.1 02/09/2014 1521   GFRNONAA 7* 02/05/2014 1521   GFRNONAA 30* 03/02/2013 1637   GFRAA 9* 02/05/2014 1521   GFRAA 35* 03/02/2013 1637    CXR: CM without overt edema or infiltrate  IMPRESSION: Acute hypoxic respiratory failure - unclear etiology Severe cardiomyopathy/CHF H/O recurrent bacterial endocarditis AKI/CKD Severe metabolic acidosis Severe hyperkalemia Adult  failure to thrive with prolonged decline in health status   PLAN:  I had a long discussion with pt's 3 daughters and one son. They all have been witness to and acknowledge her declining health and understand that we have little to offer to offset that general decline. They understand that each hospitalization leads to an acceleration in her deterioration and her recovery is less than complete each time. In this context, we discussed the options of intubation and hemodialysis. I explained that these measures might sustain her for a longer period of time but in no way would they restore her to health or a quality of life that we would celebrate as a good outcome. As such, we will take a palliative approach to include the  following  1) SDU admission for a level of nursing care that will ensure her comfort 2) HCO3 gtt 3) Oxygen therapy 4) PRN morphine 5) spiritual/emotional support for pt and family 6) full DNR including no intubation and no hemodialysis   Merton Border, MD ; [facility] service Mobile [phone].  After 5:30 PM or weekends, call [phone]

## 2014-02-05 NOTE — ED Notes (Signed)
Elevated K on Chem-8 reported to Dr. Rolland Porter

## 2014-02-05 NOTE — ED Provider Notes (Signed)
CENTRAL LINE Date/Time: 02/08/2014 4:12 PM Performed by: Joanell Rising Authorized by: Tomi Bamberger, IVA L Consent: The procedure was performed in an emergent situation. Patient identity confirmed: arm band and hospital-assigned identification number Time out: Immediately prior to procedure a "time out" was called to verify the correct patient, procedure, equipment, support staff and site/side marked as required. Indications: vascular access Patient sedated: no Preparation: skin prepped with 2% chlorhexidine Skin prep agent dried: skin prep agent completely dried prior to procedure Sterile barriers: all five maximum sterile barriers used - cap, mask, sterile gown, sterile gloves, and large sterile sheet Hand hygiene: hand hygiene performed prior to central venous catheter insertion Location details: right internal jugular Site selection rationale: low risk for ptx Patient position: flat Catheter type: triple lumen Pre-procedure: landmarks identified Ultrasound guidance: yes Sterile ultrasound techniques: sterile gel and sterile probe covers were used Number of attempts: 1 Post-procedure: line sutured Assessment: blood return through all ports and placement verified by x-ray Patient tolerance: Patient tolerated the procedure well with no immediate complications  Procedure performed under direct supervision of Dr. Tommie Sams, MD 02/20/2014 [place], MD 02/10/2014 [phone]

## 2014-02-05 NOTE — Progress Notes (Signed)
Updated Dr Halford Chessman on pt's status Aware sats 80-85 on 100% NRM New orders received Pt to be switched to N/C for comfort.

## 2014-02-05 NOTE — Telephone Encounter (Signed)
I spoke with Manuela Schwartz regarding lab work , I informed her that Dr. Elba Barman a CBC and a CMP for this patient.

## 2014-02-06 DIAGNOSIS — R0602 Shortness of breath: Secondary | ICD-10-CM

## 2014-02-06 DIAGNOSIS — R531 Weakness: Secondary | ICD-10-CM | POA: Insufficient documentation

## 2014-02-06 DIAGNOSIS — N289 Disorder of kidney and ureter, unspecified: Secondary | ICD-10-CM

## 2014-02-06 DIAGNOSIS — N189 Chronic kidney disease, unspecified: Secondary | ICD-10-CM

## 2014-02-10 NOTE — Discharge Summary (Signed)
DEATH SUMMARY  DATE OF ADMISSION:  2014/02/19   DATE OF DISCHARGE/DEATH:  02/20/14  ADMISSION DIAGNOSES:   Acute hypoxic respiratory failure Acute kidney injury Severe metabolic acidosis Severe hyperkalemia Severe cardiomyopathy/ CHF H/O recurrent bacterial endocarditis Chronic atrial fibrillation  Systemic lupus erythematosus  CKD Hiatal hernia Gastroesophageal reflux Chronic anemia Adult failure to thrive with prolonged decline in health status  DISCHARGE DIAGNOSES:   Acute hypoxic respiratory failure Acute kidney injury Severe metabolic acidosis Severe hyperkalemia Severe cardiomyopathy/ CHF H/O recurrent bacterial endocarditis Chronic atrial fibrillation  Systemic lupus erythematosus  CKD Hiatal hernia Gastroesophageal reflux Chronic anemia Adult failure to thrive with prolonged decline in health status   PRESENTATION:   Pt was admitted to the PCCM service with the following HPI and the above admission diagnoses:  HPI: Pt is unable to provide any history. This history is obtained from daughters. 15 F with extensive PMH and general decline in health over several years with an accelerated decline in past several months. She was brought to ED by family with 3 days of fatigue, weakness, AMS, anorexia. Family reports no fever, cough, CP, N/V/D, dysuria. They have not observed any change in her bladder habits. In the ED she was noted to be hypothermic, hypoxemic, acidotic (HCO3 13 on BMET) and hyperkalemic (K+ > 7.7 in ED). She received treatment for hyperkalemia and PCCM was called to admit to ICU.   HOSPITAL COURSE:   Upon evaluation in the ED, it was determined that she would need intubation and HD catheter placement for emergent hemodialysis if we were to treat her aggressively. Extensive discussion was undertaken with the patient's 4 offspring. We discussed her declining health and that there was little that could be done to change the general trajectory of that decline.  We discussed the options of intubation and hemodialysis which they declined opting for a palliative approach which included the following:  1) SDU admission for a level of nursing care that will ensure her comfort 2) HCO3 gtt 3) Oxygen therapy 4) PRN morphine 5) spiritual/emotional support for pt and family 6) full DNR including no intubation and no hemodialysis  She passed away peacefully on the morning following admission  Cause of death:  Acute on chronic renal failure with severe metabolic acidosis and hyperkalemia  Contributing factors: Acute hypoxic respiratory failure Severe cardiomyopathy/CHF Systemic lupus erythematosus   Autopsy:  No  Smoking:  No     Merton Border, MD;  PCCM service; Mobile [phone]

## 2014-02-23 NOTE — Progress Notes (Signed)
Patient died at 11am with family present.

## 2014-02-23 NOTE — Progress Notes (Signed)
HPI:  6 F with extensive PMH and general decline in health over several years with an accelerated decline in past several months. She was brought to ED by family with 3 days of fatigue, weakness, AMS, anorexia.  Family reports no fever, cough, CP, N/V/D, dysuria. They have not observed any change in her bladder habits. In the ED she was noted to be hypothermic, hypoxemic, acidotic and hyperkalemic. She received treatment for hyperkalemia and PCCM was called.  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  No JVD Chest clear anteriorly, minimal bibasilar crackles IRIR, controlled rate, + systolic M HR 40'J by monitor abd soft, NT, diminished BS Ext cool, cyanotic, diminished distal pulses Neuro: Not responsive, moving only to breathe  CBC    Component Value Date/Time   WBC 6.8 02/16/2014 1521   WBC 4.8 09/10/2013 1455   RBC 4.27 02/20/2014 1521   RBC 3.58* 11/08/2013 1140   RBC 4.51 09/10/2013 1455   HGB 14.6 01/31/2014 1538   HCT 43.0 02/10/2014 1538   PLT 107* 02/17/2014 1521   MCV 92.3 01/31/2014 1521   MCH 30.7 01/31/2014 1521   MCH 31.0 09/10/2013 1455   MCHC 33.2 02/18/2014 1521   MCHC 32.6 09/10/2013 1455   RDW 18.1* 01/30/2014 1521   RDW 16.0* 09/10/2013 1455   LYMPHSABS 1.7 02/01/2014 1521   LYMPHSABS 1.2 09/10/2013 1455   MONOABS 1.1* 02/09/2014 1521   EOSABS 0.0 01/31/2014 1521   EOSABS 0.1 09/10/2013 1455   BASOSABS 0.1 02/19/2014 1521   BASOSABS 0.0 09/10/2013 1455    BMET    Component Value Date/Time   NA 135* 02/11/2014 1538   NA 139 09/28/2013 1551   K 7.4* 01/26/2014 1538   CL 110 01/25/2014 1538   CO2  13* 02/07/2014 1521   GLUCOSE 45* 02/07/2014 1538   GLUCOSE 91 09/28/2013 1551   BUN 49* 01/27/2014 1538   BUN 25 09/28/2013 1551   CREATININE 5.50* 01/31/2014 1538   CREATININE 0.98 05/25/2013 1558   CALCIUM 10.1 02/04/2014 1521   GFRNONAA 7* 02/05/2014 1521   GFRNONAA 30* 03/02/2013 1637   GFRAA 9* 02/05/2014 1521   GFRAA 35* 03/02/2013 1637    CXR: CM without overt edema or infiltrate  IMPRESSION: Acute hypoxic respiratory failure - unclear etiology Severe cardiomyopathy/CHF H/O recurrent bacterial endocarditis AKI/CKD Severe metabolic acidosis Severe hyperkalemia Adult failure to thrive with prolonged decline in health status Now looks terminal  PLAN:  Dr Alva Garnet had a long discussion with pt's 3 daughters and one son. They all have been witness to and acknowledge her declining health and understand that we have little to offer to offset that general decline. They understand that each hospitalization leads to an acceleration in her deterioration and her recovery is less than complete each time. In this context, we discussed the options of intubation and hemodialysis. I explained that these measures might sustain her for a longer period of time but in no way would they restore her to health or a quality of life that we would celebrate as a good outcome. As such, we will take a palliative approach to include the following  1) SDU admission for a level of nursing  care that will ensure her comfort 2) HCO3 gtt 3) Oxygen therapy 4) PRN morphine 5) spiritual/emotional support for pt and family 6) full DNR including no intubation and no hemodialysis   CD Young, MD ; [facility] service Mobile [phone]. p [phone] After 5:30 PM or weekends, call [phone]

## 2014-02-23 NOTE — Progress Notes (Signed)
   09-Feb-2014 1117  Clinical Encounter Type  Visited With Family  Visit Type Death;Spiritual support  Referral From Nurse  Consult/Referral To Chaplain  Spiritual Encounters  Spiritual Needs Grief support  Stress Factors  Family Stress Factors Loss   Chaplain responded to page that pt had just died. Many family members at bedside, many tearful. Chaplain introduced self to family and had short conversation with one of patient's daughters, who shared that pt had been sick for some time. Chaplain facilitated processing of loss and grief, normalized feelings of family, and offered supportive presence.  Daughter said they were "very lucky" to be present when the pt passed. Family very supportive of each other. Daughter mentioned the upcoming holidays and how family will help them get through that difficult time. Daughter thanked Clinical biochemist for coming. Family did not need further chaplain support.   Vanetta Mulders February 09, 2014 11:21 AM

## 2014-02-23 DEATH — deceased

## 2015-03-28 IMAGING — CT CT HEAD W/O CM
1 series · 16 of 30 positions shown, 20 images · non-contrast
Comparison: MRI brain dated 11/14/2013

CLINICAL DATA: Altered mental status, confusion, weakness

EXAM:
CT HEAD WITHOUT CONTRAST
TECHNIQUE: Contiguous axial images were obtained from the base of the skull
through the vertex without intravenous contrast.

[Series 2: head 5.0 h30s · axial · 0.41mm/px · z∈[-147,-7]mm · 16 of 32 slices shown, 20 images]
[im 2/32  brain]
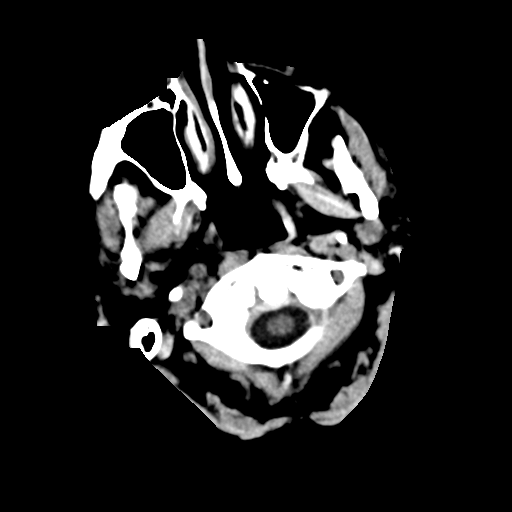
[im 2/32  bone]
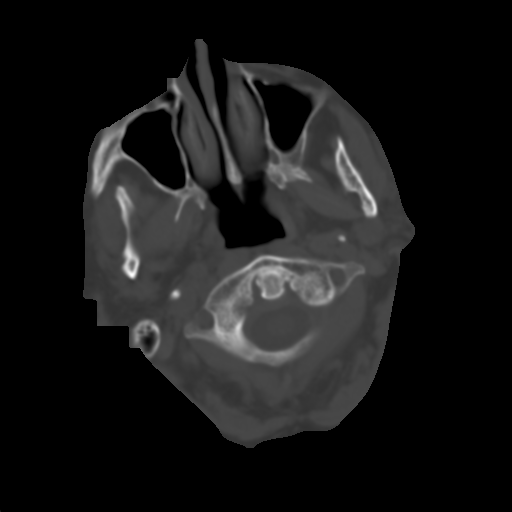
[im 4/32  brain]
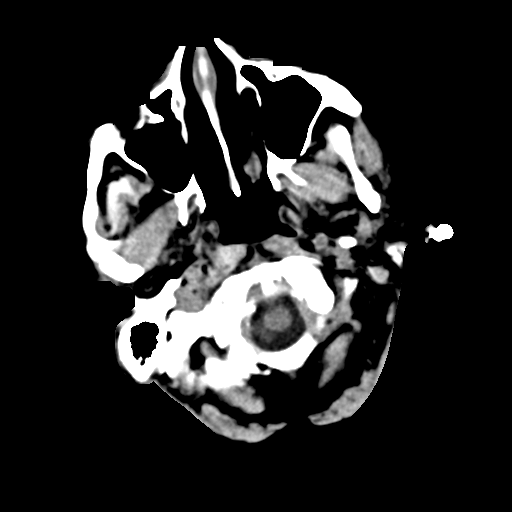
[im 6/32  brain]
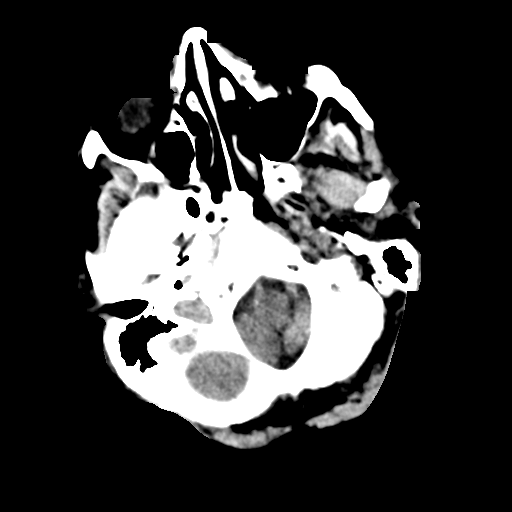
[im 8/32  brain]
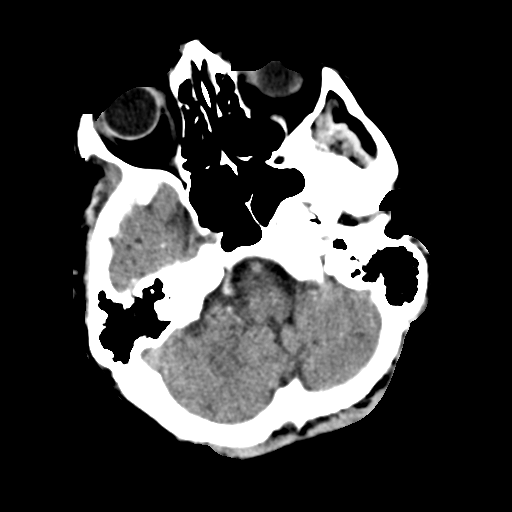
[im 9/32  brain]
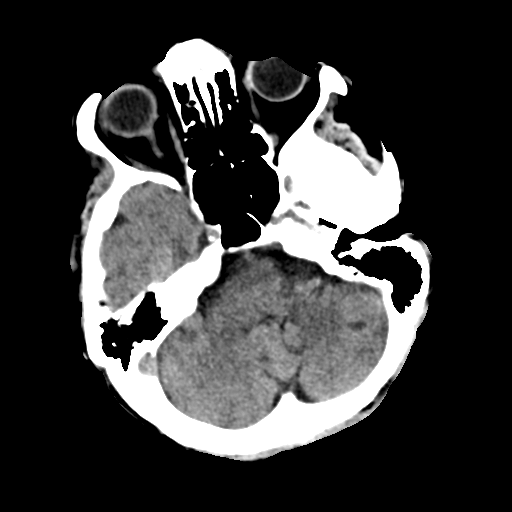
[im 9/32  bone]
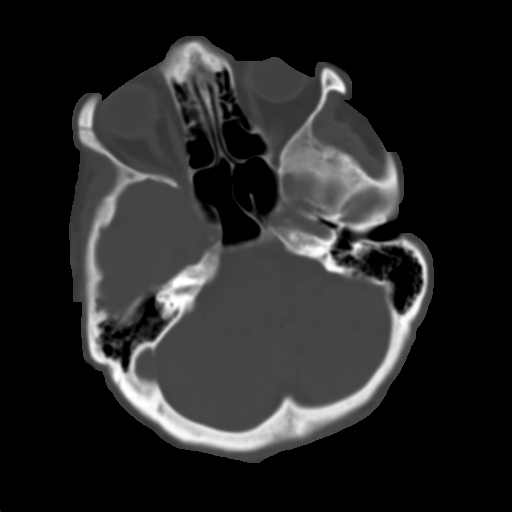
[im 11/32  brain]
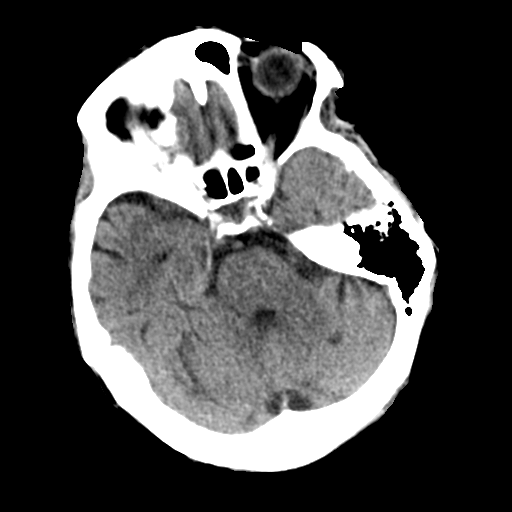
[im 13/32  brain]
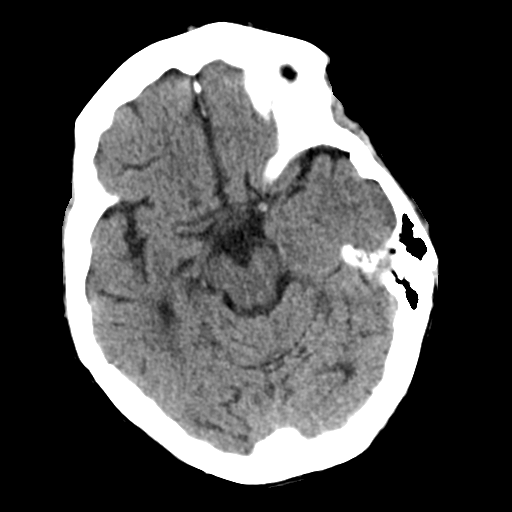
[im 15/32  brain]
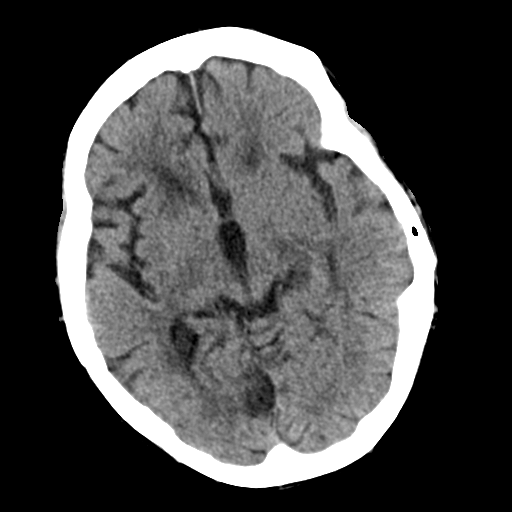
[im 17/32  brain]
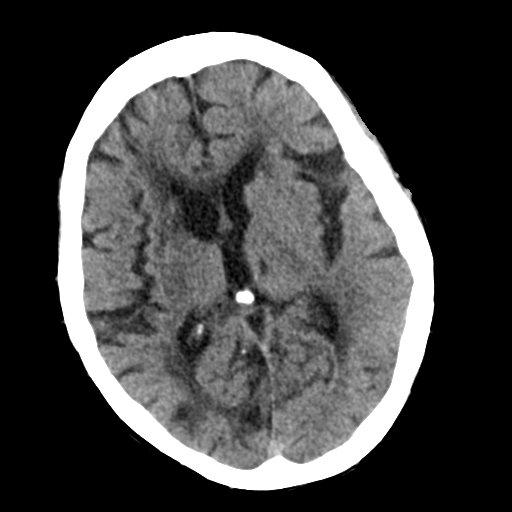
[im 17/32  bone]
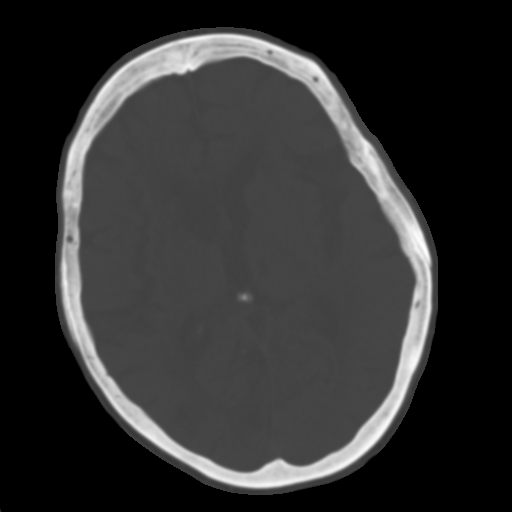
[im 19/32  brain]
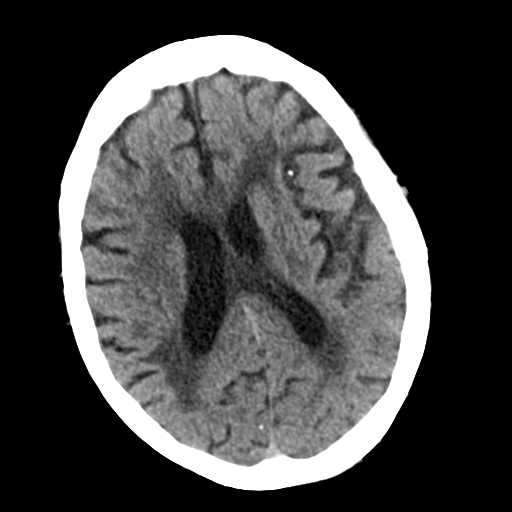
[im 21/32  brain]
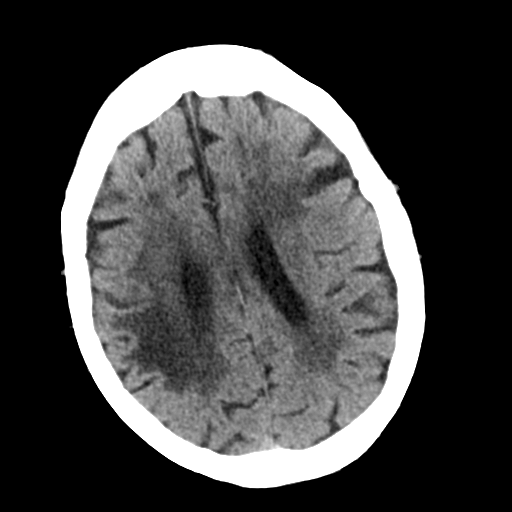
[im 23/32  brain]
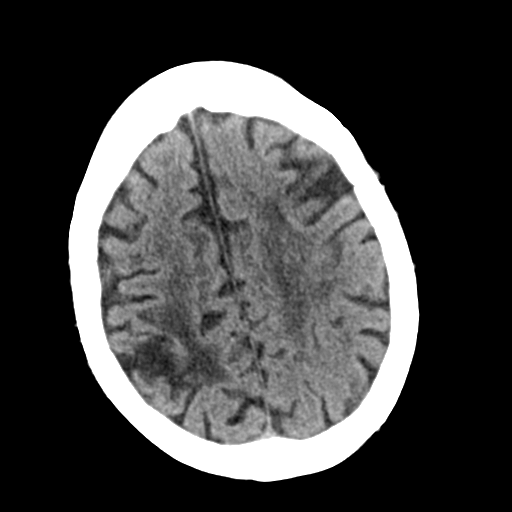
[im 24/32  brain]
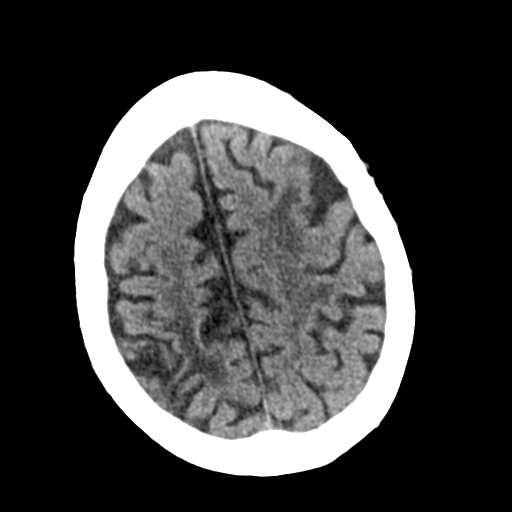
[im 24/32  bone]
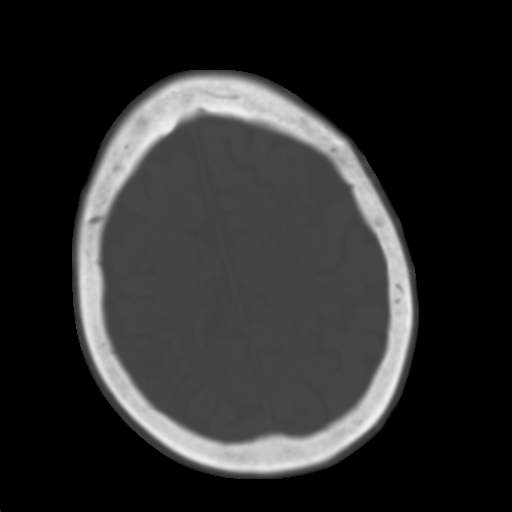
[im 26/32  brain]
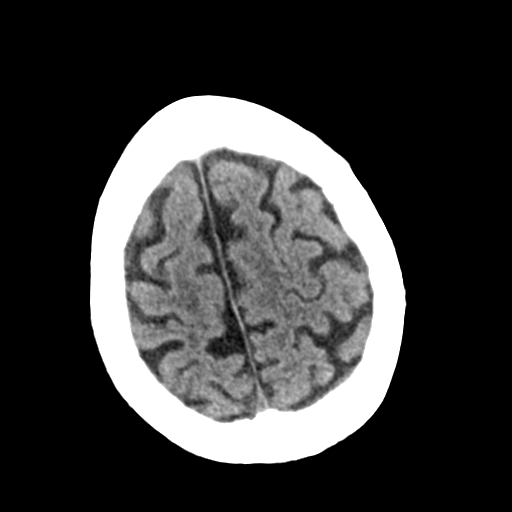
[im 28/32  brain]
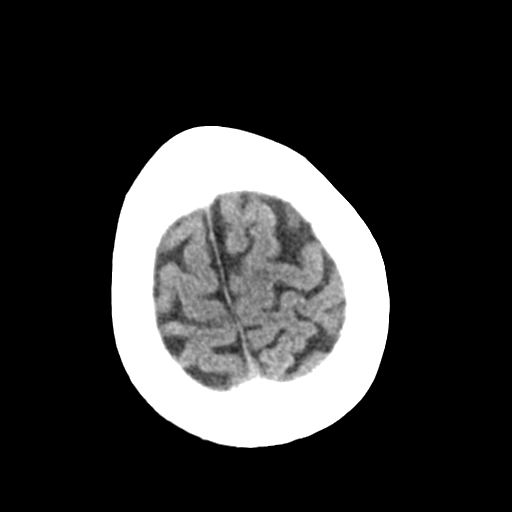
[im 30/32  brain]
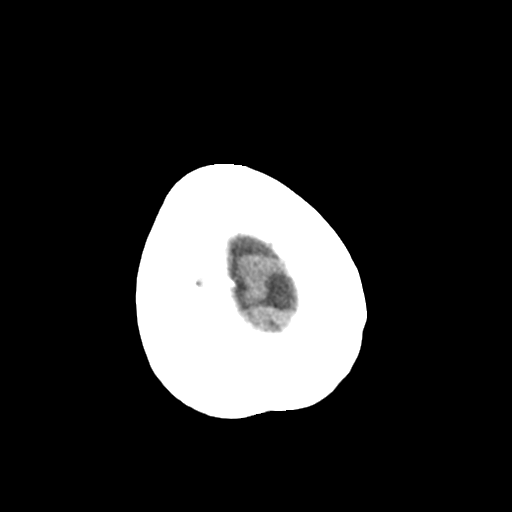

[16 of 30 positions shown; findings below may reference images not displayed]

FINDINGS: No evidence of parenchymal hemorrhage or extra-axial fluid
collection. No mass lesion, mass effect, or midline shift.

No CT evidence of acute infarction.

Old right MCA distribution infarct. Old left colonic lacunar
infarct.

Extensive small vessel ischemic changes. Intracranial
atherosclerosis.

Global cortical atrophy.  No ventriculomegaly.

The visualized paranasal sinuses are essentially clear. The mastoid
air cells are unopacified.

No evidence of calvarial fracture.
IMPRESSION: No evidence of acute intracranial abnormality.

Old right MCA distribution infarct.

Atrophy with extensive small vessel ischemic changes and
intracranial atherosclerosis.

## 2015-03-30 IMAGING — CR DG CHEST 1V PORT
1 series · 1 of 1 positions shown · non-contrast
Comparison: 11/28/2013 and CT angio chest 11/27/2013 and earlier
priors.

CLINICAL DATA: Shortness of breath.

EXAM:
PORTABLE CHEST - 1 VIEW

[AP]
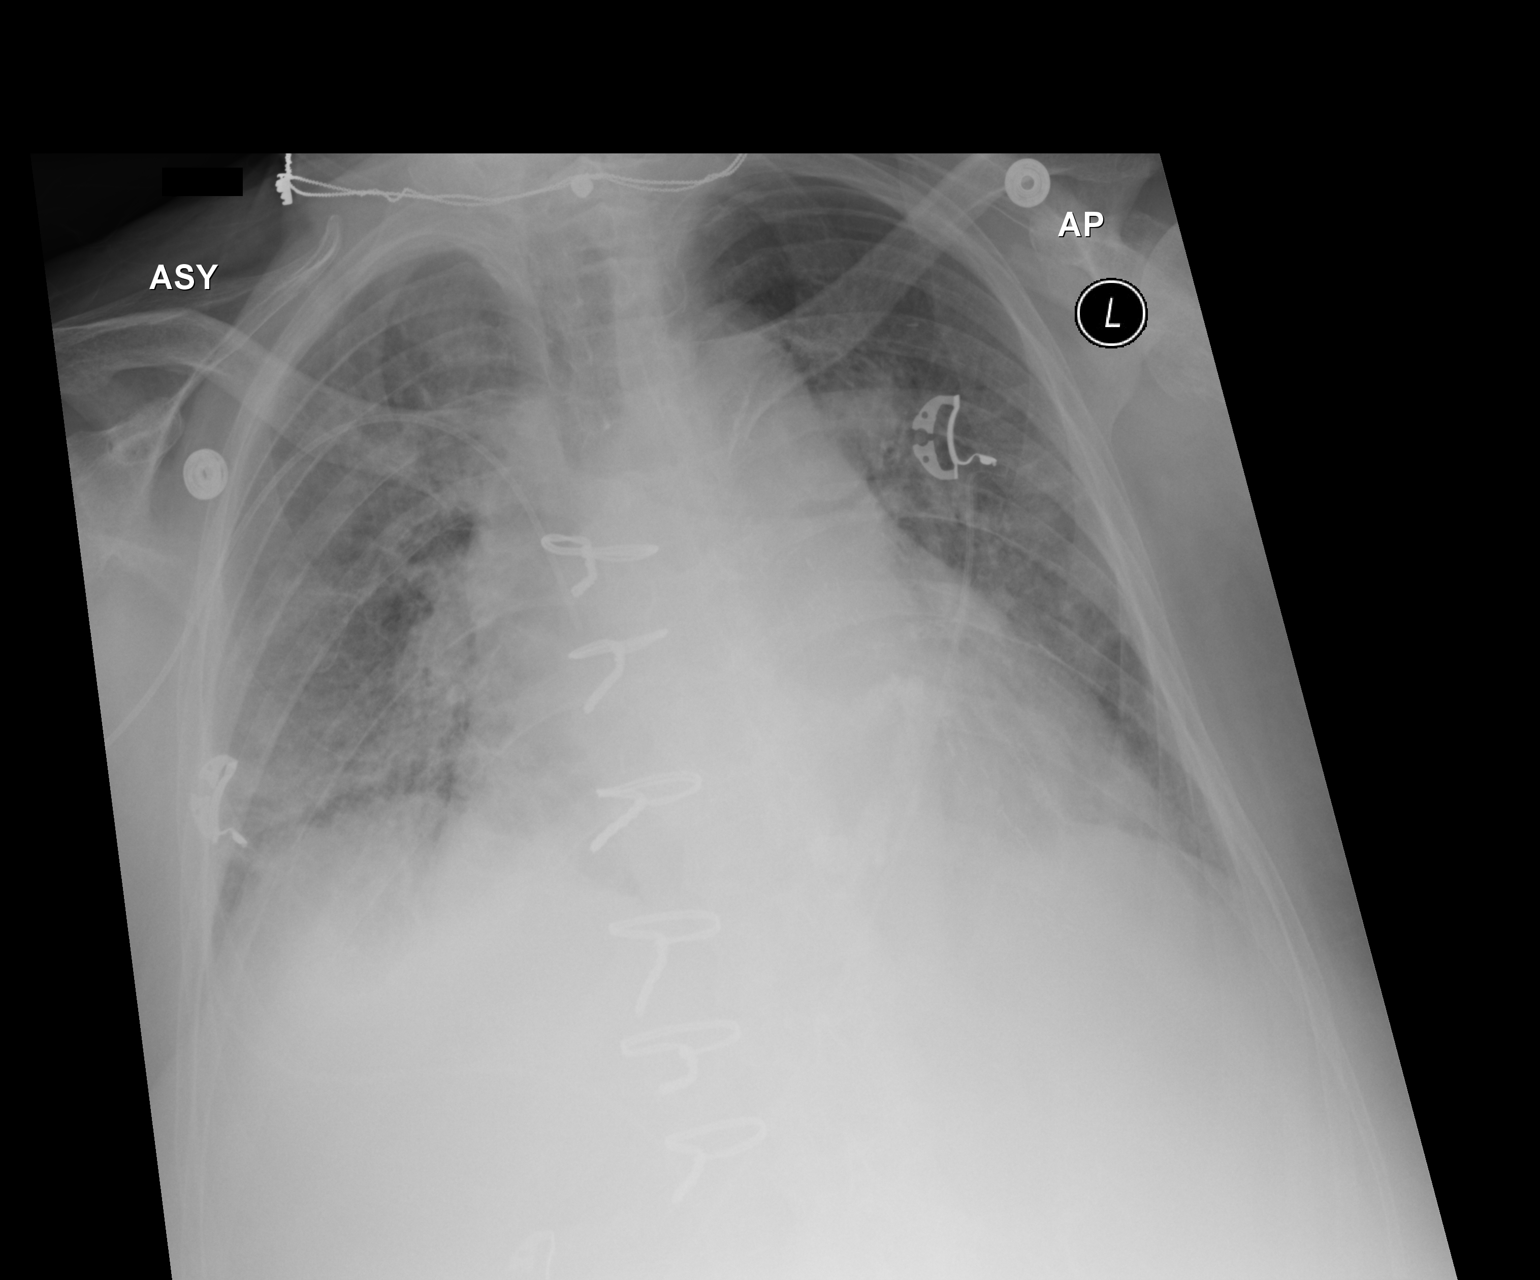

[1 of 1 positions shown; findings below may reference images not displayed]

FINDINGS: There are changes of median sternotomy for CABG. Stable chronic
moderate cardiac enlargement. Heavy mitral annulus calcifications
are noted. There is diffuse bilateral interstitial prominence,
similar to recent chest radiographs of 11/15/2013 and 11/16/2013.
Pulmonary vascularity appears slightly prominent. No consolidation.
No visible pleural effusion. Negative for pneumothorax.

Right upper extremity PICC appears stable, projecting over the mid
SVC.
IMPRESSION: Stable chronic moderate cardiomegaly. Diffuse interstitial
prominence may reflect interstitial pulmonary edema.
